# Patient Record
Sex: Male | Born: 1940
Health system: Southern US, Community
[De-identification: ages and names within clinical notes are randomized; demographics above are authoritative.]

## PROBLEM LIST (undated history)

## (undated) DIAGNOSIS — E119 Type 2 diabetes mellitus without complications: Secondary | ICD-10-CM

## (undated) DIAGNOSIS — R9439 Abnormal result of other cardiovascular function study: Secondary | ICD-10-CM

## (undated) DIAGNOSIS — N189 Chronic kidney disease, unspecified: Secondary | ICD-10-CM

## (undated) DIAGNOSIS — I10 Essential (primary) hypertension: Secondary | ICD-10-CM

## (undated) DIAGNOSIS — I4510 Unspecified right bundle-branch block: Secondary | ICD-10-CM

## (undated) DIAGNOSIS — R0600 Dyspnea, unspecified: Secondary | ICD-10-CM

## (undated) DIAGNOSIS — E669 Obesity, unspecified: Secondary | ICD-10-CM

## (undated) DIAGNOSIS — R42 Dizziness and giddiness: Secondary | ICD-10-CM

## (undated) DIAGNOSIS — Z8719 Personal history of other diseases of the digestive system: Secondary | ICD-10-CM

## (undated) DIAGNOSIS — I6521 Occlusion and stenosis of right carotid artery: Secondary | ICD-10-CM

## (undated) DIAGNOSIS — R0609 Other forms of dyspnea: Secondary | ICD-10-CM

## (undated) DIAGNOSIS — I509 Heart failure, unspecified: Secondary | ICD-10-CM

## (undated) DIAGNOSIS — I219 Acute myocardial infarction, unspecified: Secondary | ICD-10-CM

## (undated) DIAGNOSIS — M1990 Unspecified osteoarthritis, unspecified site: Secondary | ICD-10-CM

## (undated) DIAGNOSIS — G629 Polyneuropathy, unspecified: Secondary | ICD-10-CM

## (undated) DIAGNOSIS — K219 Gastro-esophageal reflux disease without esophagitis: Secondary | ICD-10-CM

## (undated) DIAGNOSIS — E785 Hyperlipidemia, unspecified: Secondary | ICD-10-CM

## (undated) DIAGNOSIS — I2510 Atherosclerotic heart disease of native coronary artery without angina pectoris: Secondary | ICD-10-CM

## (undated) DIAGNOSIS — N529 Male erectile dysfunction, unspecified: Secondary | ICD-10-CM

## (undated) DIAGNOSIS — K429 Umbilical hernia without obstruction or gangrene: Secondary | ICD-10-CM

## (undated) HISTORY — DX: Abnormal result of other cardiovascular function study: R94.39

## (undated) HISTORY — DX: Obesity, unspecified: E66.9

## (undated) HISTORY — DX: Unspecified right bundle-branch block: I45.10

## (undated) HISTORY — DX: Occlusion and stenosis of right carotid artery: I65.21

## (undated) HISTORY — DX: Male erectile dysfunction, unspecified: N52.9

## (undated) HISTORY — DX: Dyspnea, unspecified: R06.00

## (undated) HISTORY — DX: Chronic kidney disease, unspecified: N18.9

## (undated) HISTORY — DX: Type 2 diabetes mellitus without complications: E11.9

## (undated) HISTORY — DX: Other forms of dyspnea: R06.09

## (undated) HISTORY — PX: COLONOSCOPY: SHX174

## (undated) HISTORY — DX: Heart failure, unspecified: I50.9

## (undated) HISTORY — DX: Dizziness and giddiness: R42

## (undated) HISTORY — DX: Hyperlipidemia, unspecified: E78.5

## (undated) HISTORY — DX: Atherosclerotic heart disease of native coronary artery without angina pectoris: I25.10

## (undated) HISTORY — DX: Essential (primary) hypertension: I10

---

## 1993-05-25 HISTORY — PX: CORONARY ARTERY BYPASS GRAFT: SHX141

## 1998-05-25 HISTORY — PX: CARDIAC CATHETERIZATION: SHX172

## 2000-11-01 ENCOUNTER — Encounter: Payer: Self-pay | Admitting: Emergency Medicine

## 2000-11-01 ENCOUNTER — Emergency Department (HOSPITAL_COMMUNITY): Admission: EM | Admit: 2000-11-01 | Discharge: 2000-11-01 | Payer: Self-pay | Admitting: Emergency Medicine

## 2002-06-28 ENCOUNTER — Emergency Department (HOSPITAL_COMMUNITY): Admission: EM | Admit: 2002-06-28 | Discharge: 2002-06-28 | Payer: Self-pay | Admitting: Emergency Medicine

## 2002-06-28 ENCOUNTER — Encounter: Payer: Self-pay | Admitting: Emergency Medicine

## 2006-05-15 ENCOUNTER — Emergency Department (HOSPITAL_COMMUNITY): Admission: EM | Admit: 2006-05-15 | Discharge: 2006-05-15 | Payer: Self-pay | Admitting: Emergency Medicine

## 2007-01-06 ENCOUNTER — Emergency Department (HOSPITAL_COMMUNITY): Admission: EM | Admit: 2007-01-06 | Discharge: 2007-01-06 | Payer: Self-pay | Admitting: Emergency Medicine

## 2007-09-15 ENCOUNTER — Emergency Department (HOSPITAL_COMMUNITY): Admission: EM | Admit: 2007-09-15 | Discharge: 2007-09-15 | Payer: Self-pay | Admitting: Family Medicine

## 2007-12-24 HISTORY — PX: UMBILICAL HERNIA REPAIR: SHX196

## 2008-01-17 ENCOUNTER — Encounter (INDEPENDENT_AMBULATORY_CARE_PROVIDER_SITE_OTHER): Payer: Self-pay | Admitting: General Surgery

## 2008-01-17 ENCOUNTER — Ambulatory Visit (HOSPITAL_COMMUNITY): Admission: RE | Admit: 2008-01-17 | Discharge: 2008-01-17 | Payer: Self-pay | Admitting: General Surgery

## 2008-08-18 ENCOUNTER — Ambulatory Visit: Payer: Self-pay | Admitting: Cardiology

## 2008-08-18 ENCOUNTER — Inpatient Hospital Stay (HOSPITAL_COMMUNITY): Admission: EM | Admit: 2008-08-18 | Discharge: 2008-08-23 | Payer: Self-pay | Admitting: Emergency Medicine

## 2008-09-18 ENCOUNTER — Encounter: Admission: RE | Admit: 2008-09-18 | Discharge: 2008-09-18 | Payer: Self-pay | Admitting: Cardiology

## 2008-09-19 ENCOUNTER — Encounter: Admission: RE | Admit: 2008-09-19 | Discharge: 2008-09-19 | Payer: Self-pay | Admitting: Cardiology

## 2008-09-20 ENCOUNTER — Ambulatory Visit: Payer: Self-pay | Admitting: Surgery

## 2008-09-24 ENCOUNTER — Inpatient Hospital Stay (HOSPITAL_COMMUNITY): Admission: RE | Admit: 2008-09-24 | Discharge: 2008-09-25 | Payer: Self-pay | Admitting: Cardiology

## 2009-06-17 ENCOUNTER — Encounter
Admission: RE | Admit: 2009-06-17 | Discharge: 2009-06-17 | Payer: Self-pay | Source: Home / Self Care | Admitting: Cardiology

## 2009-12-23 ENCOUNTER — Ambulatory Visit: Payer: Self-pay | Admitting: Cardiology

## 2010-06-04 ENCOUNTER — Ambulatory Visit (HOSPITAL_COMMUNITY)
Admission: RE | Admit: 2010-06-04 | Discharge: 2010-06-05 | Payer: Self-pay | Source: Home / Self Care | Attending: Cardiovascular Disease | Admitting: Cardiovascular Disease

## 2010-06-06 ENCOUNTER — Ambulatory Visit: Payer: Self-pay | Admitting: Cardiology

## 2010-06-09 LAB — CBC
HCT: 41.2 % (ref 39.0–52.0)
Hemoglobin: 14.2 g/dL (ref 13.0–17.0)
MCH: 31.9 pg (ref 26.0–34.0)
MCHC: 34.5 g/dL (ref 30.0–36.0)
MCV: 92.6 fL (ref 78.0–100.0)
Platelets: 211 10*3/uL (ref 150–400)
RBC: 4.45 MIL/uL (ref 4.22–5.81)
RDW: 12.7 % (ref 11.5–15.5)
WBC: 5.3 10*3/uL (ref 4.0–10.5)

## 2010-06-09 LAB — BASIC METABOLIC PANEL
BUN: 17 mg/dL (ref 6–23)
BUN: 19 mg/dL (ref 6–23)
CO2: 23 mEq/L (ref 19–32)
CO2: 27 mEq/L (ref 19–32)
Calcium: 9.5 mg/dL (ref 8.4–10.5)
Calcium: 9 mg/dL (ref 8.4–10.5)
Chloride: 106 mEq/L (ref 96–112)
Chloride: 104 mEq/L (ref 96–112)
Creatinine, Ser: 1.66 mg/dL — ABNORMAL HIGH (ref 0.4–1.5)
Creatinine, Ser: 1.71 mg/dL — ABNORMAL HIGH (ref 0.4–1.5)
GFR calc Af Amer: 50 mL/min — ABNORMAL LOW (ref >60)
GFR calc Af Amer: 49 mL/min — ABNORMAL LOW (ref >60)
GFR calc non Af Amer: 42 mL/min — ABNORMAL LOW (ref >60)
GFR calc non Af Amer: 40 mL/min — ABNORMAL LOW (ref >60)
Glucose, Bld: 114 mg/dL — ABNORMAL HIGH (ref 70–99)
Glucose, Bld: 102 mg/dL — ABNORMAL HIGH (ref 70–99)
Potassium: 4.2 mEq/L (ref 3.5–5.1)
Potassium: 4.7 mEq/L (ref 3.5–5.1)
Sodium: 138 mEq/L (ref 135–145)
Sodium: 137 mEq/L (ref 135–145)

## 2010-06-11 ENCOUNTER — Ambulatory Visit: Payer: Self-pay | Admitting: Cardiology

## 2010-06-19 NOTE — H&P (Addendum)
NAME:  Chris [name], Chris [name] NO.:  [id number]  MEDICAL RECORD NO.:  [id number]            PATIENT TYPE:  LOCATION:                                 FACILITY:  PHYSICIAN:  Colleen Can. Deborah Chalk, M.D.    DATE OF BIRTH:  DATE OF ADMISSION:  06/04/2010 DATE OF DISCHARGE:                             HISTORY & PHYSICAL   REASON FOR ADMISSION:  For cardiac catheterization and possible angioplasty.  HISTORY OF PRESENT ILLNESS:  Chris [name] has a known history of ischemic heart disease.  Over the last 2 weeks, he has been having an exertional substernal discomfort that radiates to his left neck.  He has had increasing fatigue as well as shortness of breath.  He has a remote history of esophageal stricture and paraesophageal hiatal hernia, but this is a totally different symptom than that.  It is clearly an exertional symptom similar to his known coronary artery disease.  He had acute inferior myocardial infarction with an occluded saphenous vein graft to the right coronary artery on August 18, 2008.  He had a drug-eluting stent placed in the saphenous vein graft.  He was brought back to the Catheterization Lab in May 2010 with a successful stent placement in the native right coronary artery with an overall improvement of flow.  He remained on Plavix for an entire year and basically has done well.  He had his original bypass surgery in 1995. At that time, he had a left internal mammary artery graft to the LAD, saphenous vein graft to the right coronary artery, and saphenous vein graft to the obtuse marginal branch.  He had a postoperative sudden onset of a right inferior quadranopsia that was homonymous in nature. It has resolved.  He has a history of chronic right bundle-branch block, gout, hyperlipidemia, obesity, hypertension, erectile dysfunction, and previous vertigo.  FAMILY HISTORY AND SOCIAL HISTORY:  Positive for coronary artery disease.  His father had previous bypass  surgery.  He is married.  He has 4 children from a previous marriage.  He has not smoked in several years.  CURRENT MEDICATIONS:  Aspirin 1 a day, simvastatin 20 mg a day, fenofibrate, and Protonix 40 mg per day.  ALLERGIES:  None.  PHYSICAL EXAMINATION:  VITAL SIGNS:  His weight is 239.  Blood pressure 138/84 sitting, 130/80 standing.  Heart rate is 53 and regular. HEENT:  Negative. NECK:  Supple without bruits. LUNGS: Clear. HEART:  Regular rate and rhythm. ABDOMEN:  Soft and nontender, but obese. EXTREMITIES:  Without edema.  OVERALL IMPRESSION: 1. Recurrent substernal chest pain with a history of previous coronary     artery bypass grafting in 1995 with previous stenting of both the     vein graft to the right coronary artery and the native right     coronary artery. 2. 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 3. Obesity. 4. 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 5. History of dysphagia.  PLAN:  The patient will be admitted for cardiac catheterization, coronary arteriograms, and possible angioplasty.  Procedure risks and benefits have been explained and the risks including  heart attack, stroke, heart stoppage, death, allergy, and emboli bleeding were explained and the patient is willing to proceed.     Colleen Can. Deborah Chalk, M.D.     SNT/MEDQ  D:  06/03/2010  T:  06/04/2010  Job:  [id number]  Electronically Signed by Roger Shelter M.D. on 06/19/2010 03:37:03 PM

## 2010-07-10 ENCOUNTER — Other Ambulatory Visit (INDEPENDENT_AMBULATORY_CARE_PROVIDER_SITE_OTHER): Payer: PRIVATE HEALTH INSURANCE

## 2010-07-10 DIAGNOSIS — I2510 Atherosclerotic heart disease of native coronary artery without angina pectoris: Secondary | ICD-10-CM

## 2010-08-01 NOTE — Discharge Summary (Signed)
NAME:  Chris [name], Chris [name] NO.:  [id number]  MEDICAL RECORD NO.:  [id number]          PATIENT TYPE:  OIB  LOCATION:  6529                         FACILITY:  [facility]  PHYSICIAN:  Verne Carrow, MDDATE OF BIRTH:  [birth year]  DATE OF ADMISSION:  06/04/2010 DATE OF DISCHARGE:  06/05/2010                              DISCHARGE SUMMARY   PROCEDURES: 1. Cardiac catheterization. 2. Coronary arteriogram. 3. LIMA arteriogram. 4. Saphenous vein angiogram.  PRIMARY FINAL DISCHARGE DIAGNOSIS:  Chest pain, medical therapy for coronary artery disease recommended.  SECONDARY DIAGNOSES: 1. Status post aortocoronary bypass surgery in 1995, with left     internal mammary artery graft to left anterior descending coronary     artery, saphenous vein graft to right coronary artery, saphenous     vein graft to obtuse marginal. 2. Inferior ST elevation myocardial infarction with percutaneous     transluminal coronary angioplasty and drug-eluting stent x2 to the     saphenous vein graft to right coronary artery in March 2010. 3. Mild left ventricular dysfunction with an EF of approximately 50%,     cath in March 2010. 4. Gout. 5. History of vertigo. 6. Hyperlipidemia. 7. Obesity. 8. Hypertension. 9. Family history of coronary artery disease in his father. 10.Status post umbilical hernia repair.  Time at discharge 38 minutes.  HOSPITAL COURSE:  Chris [name] is a 70 year old male with a history of coronary artery disease.  He had increasing anginal symptoms and came to the hospital where he was admitted for further evaluation and catheterization.  Cardiac catheterization was performed on June 04, 2010.  It showed LAD totalled, circumflex totalled, RCA with 70% stenosis, and 80% in- stent restenosis proximal vein graft.  All of these grafts were patent. No LV gram was done secondary to renal insufficiency.  Dr. Clifton James evaluated the films and felt that at this time medical  therapy is the best option.  The proximal RCA could possibly be stented with cutting balloon angioplasty for the mid and distal in-stent restenosis.  On June 05, 2010, Chris [name] was evaluated by Dr. Clifton James.  It was noted there was some progression of disease in the native RCA, but the patient did not wish to start Imdur and medical therapy was recommended at this time.  He had some renal insufficiency with a BUN and creatinine on admission at 17 and 1.66, and post cath, his BUN and creatinine were 19/1.71.  He is to get a BMET next week.  In May 2010, at the time of his last cath, his BUN was 12 with creatinine 1.15.  On June 05, 2010, Chris [name] was evaluated by Dr. Clifton James, considered stable for discharge, to follow up as an outpatient.  DISCHARGE INSTRUCTIONS:  His activity level is to be increased gradually.  He is not to drive for 2 days and no lifting for a week.  He is to call our office for problems with cath site.  He is encouraged to stick to a low-sodium, heart-healthy diet.  He is to follow up with Dr. Deborah Chalk and an appointment will be arranged with the patient contacted regarding this.  DISCHARGE MEDICATIONS: 1. Colchicine 0.6 mg daily. 2. Fenofibrate 160 mg daily. 3. Zocor 20 mg daily. 4. Aspirin 325 mg daily. 5. Omeprazole 40 mg daily. 6. Sublingual nitroglycerin p.r.n.     Theodore Demark, PA-C   ______________________________ Verne Carrow, MD    RB/MEDQ  D:  06/05/2010  T:  06/06/2010  Job:  [id number]  cc:   Colleen Can. Deborah Chalk, M.D.  Electronically Signed by Theodore Demark PA-C on 07/29/2010 06:15:05 AM Electronically Signed by Verne Carrow MD on 07/31/2010 04:49:09 PM

## 2010-09-02 LAB — CBC
HCT: 39.2 % (ref 39.0–52.0)
HCT: 41.4 % (ref 39.0–52.0)
Hemoglobin: 13.7 g/dL (ref 13.0–17.0)
Hemoglobin: 14.4 g/dL (ref 13.0–17.0)
MCHC: 34.8 g/dL (ref 30.0–36.0)
MCHC: 34.9 g/dL (ref 30.0–36.0)
MCV: 94.5 fL (ref 78.0–100.0)
MCV: 94.7 fL (ref 78.0–100.0)
Platelets: 180 10*3/uL (ref 150–400)
Platelets: 202 10*3/uL (ref 150–400)
RBC: 4.15 MIL/uL — ABNORMAL LOW (ref 4.22–5.81)
RBC: 4.37 MIL/uL (ref 4.22–5.81)
RDW: 13.2 % (ref 11.5–15.5)
RDW: 13.5 % (ref 11.5–15.5)
WBC: 4.6 10*3/uL (ref 4.0–10.5)
WBC: 4.9 10*3/uL (ref 4.0–10.5)

## 2010-09-02 LAB — BASIC METABOLIC PANEL
BUN: 12 mg/dL (ref 6–23)
BUN: 12 mg/dL (ref 6–23)
CO2: 23 mEq/L (ref 19–32)
CO2: 27 mEq/L (ref 19–32)
Calcium: 8.9 mg/dL (ref 8.4–10.5)
Calcium: 9 mg/dL (ref 8.4–10.5)
Chloride: 105 mEq/L (ref 96–112)
Chloride: 105 mEq/L (ref 96–112)
Creatinine, Ser: 1.13 mg/dL (ref 0.4–1.5)
Creatinine, Ser: 1.15 mg/dL (ref 0.4–1.5)
GFR calc Af Amer: >60 mL/min (ref >60)
GFR calc Af Amer: >60 mL/min (ref >60)
GFR calc non Af Amer: >60 mL/min (ref >60)
GFR calc non Af Amer: >60 mL/min (ref >60)
Glucose, Bld: 102 mg/dL — ABNORMAL HIGH (ref 70–99)
Glucose, Bld: 118 mg/dL — ABNORMAL HIGH (ref 70–99)
Potassium: 4.1 mEq/L (ref 3.5–5.1)
Potassium: 4.3 mEq/L (ref 3.5–5.1)
Sodium: 139 mEq/L (ref 135–145)
Sodium: 140 mEq/L (ref 135–145)

## 2010-09-02 LAB — PROTIME-INR
INR: 1 (ref 0.00–1.49)
Prothrombin Time: 12.9 seconds (ref 11.6–15.2)

## 2010-09-02 LAB — APTT: aPTT: 29 seconds (ref 24–37)

## 2010-09-04 LAB — DIFFERENTIAL
Basophils Absolute: 0 10*3/uL (ref 0.0–0.1)
Basophils Relative: 1 % (ref 0–1)
Eosinophils Absolute: 0.2 10*3/uL (ref 0.0–0.7)
Eosinophils Relative: 4 % (ref 0–5)
Lymphocytes Relative: 46 % (ref 12–46)
Lymphs Abs: 2.7 10*3/uL (ref 0.7–4.0)
Monocytes Absolute: 0.5 10*3/uL (ref 0.1–1.0)
Monocytes Relative: 9 % (ref 3–12)
Neutro Abs: 2.5 10*3/uL (ref 1.7–7.7)
Neutrophils Relative %: 41 % — ABNORMAL LOW (ref 43–77)

## 2010-09-04 LAB — COMPREHENSIVE METABOLIC PANEL
ALT: 26 U/L (ref 0–53)
ALT: 26 U/L (ref 0–53)
ALT: 27 U/L (ref 0–53)
ALT: 48 U/L (ref 0–53)
AST: 20 U/L (ref 0–37)
AST: 47 U/L — ABNORMAL HIGH (ref 0–37)
AST: 54 U/L — ABNORMAL HIGH (ref 0–37)
AST: 57 U/L — ABNORMAL HIGH (ref 0–37)
Albumin: 2.9 g/dL — ABNORMAL LOW (ref 3.5–5.2)
Albumin: 3 g/dL — ABNORMAL LOW (ref 3.5–5.2)
Albumin: 3.1 g/dL — ABNORMAL LOW (ref 3.5–5.2)
Albumin: 3.4 g/dL — ABNORMAL LOW (ref 3.5–5.2)
Alkaline Phosphatase: 49 U/L (ref 39–117)
Alkaline Phosphatase: 50 U/L (ref 39–117)
Alkaline Phosphatase: 52 U/L (ref 39–117)
Alkaline Phosphatase: 70 U/L (ref 39–117)
BUN: 12 mg/dL (ref 6–23)
BUN: 15 mg/dL (ref 6–23)
BUN: 15 mg/dL (ref 6–23)
BUN: 16 mg/dL (ref 6–23)
CO2: 24 mEq/L (ref 19–32)
CO2: 24 mEq/L (ref 19–32)
CO2: 25 mEq/L (ref 19–32)
CO2: 25 mEq/L (ref 19–32)
Calcium: 8.3 mg/dL — ABNORMAL LOW (ref 8.4–10.5)
Calcium: 8.4 mg/dL (ref 8.4–10.5)
Calcium: 8.7 mg/dL (ref 8.4–10.5)
Calcium: 8.8 mg/dL (ref 8.4–10.5)
Chloride: 102 mEq/L (ref 96–112)
Chloride: 104 mEq/L (ref 96–112)
Chloride: 110 mEq/L (ref 96–112)
Chloride: 99 mEq/L (ref 96–112)
Creatinine, Ser: 1.16 mg/dL (ref 0.4–1.5)
Creatinine, Ser: 1.2 mg/dL (ref 0.4–1.5)
Creatinine, Ser: 1.27 mg/dL (ref 0.4–1.5)
Creatinine, Ser: 1.33 mg/dL (ref 0.4–1.5)
GFR calc Af Amer: >60 mL/min (ref >60)
GFR calc Af Amer: >60 mL/min (ref >60)
GFR calc Af Amer: >60 mL/min (ref >60)
GFR calc Af Amer: >60 mL/min (ref >60)
GFR calc non Af Amer: 54 mL/min — ABNORMAL LOW (ref >60)
GFR calc non Af Amer: 57 mL/min — ABNORMAL LOW (ref >60)
GFR calc non Af Amer: >60 mL/min (ref >60)
GFR calc non Af Amer: >60 mL/min (ref >60)
Glucose, Bld: 108 mg/dL — ABNORMAL HIGH (ref 70–99)
Glucose, Bld: 113 mg/dL — ABNORMAL HIGH (ref 70–99)
Glucose, Bld: 123 mg/dL — ABNORMAL HIGH (ref 70–99)
Glucose, Bld: 135 mg/dL — ABNORMAL HIGH (ref 70–99)
Potassium: 3.8 mEq/L (ref 3.5–5.1)
Potassium: 3.8 mEq/L (ref 3.5–5.1)
Potassium: 4 mEq/L (ref 3.5–5.1)
Potassium: 5.3 mEq/L — ABNORMAL HIGH (ref 3.5–5.1)
Sodium: 131 mEq/L — ABNORMAL LOW (ref 135–145)
Sodium: 134 mEq/L — ABNORMAL LOW (ref 135–145)
Sodium: 135 mEq/L (ref 135–145)
Sodium: 141 mEq/L (ref 135–145)
Total Bilirubin: 0.5 mg/dL (ref 0.3–1.2)
Total Bilirubin: 0.6 mg/dL (ref 0.3–1.2)
Total Bilirubin: 0.7 mg/dL (ref 0.3–1.2)
Total Bilirubin: 1 mg/dL (ref 0.3–1.2)
Total Protein: 5.8 g/dL — ABNORMAL LOW (ref 6.0–8.3)
Total Protein: 5.9 g/dL — ABNORMAL LOW (ref 6.0–8.3)
Total Protein: 6.7 g/dL (ref 6.0–8.3)
Total Protein: 7.3 g/dL (ref 6.0–8.3)

## 2010-09-04 LAB — CBC
HCT: 37.6 % — ABNORMAL LOW (ref 39.0–52.0)
HCT: 39.3 % (ref 39.0–52.0)
HCT: 41.9 % (ref 39.0–52.0)
HCT: 43.5 % (ref 39.0–52.0)
Hemoglobin: 13.1 g/dL (ref 13.0–17.0)
Hemoglobin: 13.6 g/dL (ref 13.0–17.0)
Hemoglobin: 14.6 g/dL (ref 13.0–17.0)
Hemoglobin: 15 g/dL (ref 13.0–17.0)
MCHC: 34.4 g/dL (ref 30.0–36.0)
MCHC: 34.6 g/dL (ref 30.0–36.0)
MCHC: 34.8 g/dL (ref 30.0–36.0)
MCHC: 34.9 g/dL (ref 30.0–36.0)
MCV: 95.2 fL (ref 78.0–100.0)
MCV: 95.6 fL (ref 78.0–100.0)
MCV: 95.8 fL (ref 78.0–100.0)
MCV: 96.1 fL (ref 78.0–100.0)
Platelets: 178 10*3/uL (ref 150–400)
Platelets: 186 10*3/uL (ref 150–400)
Platelets: 194 10*3/uL (ref 150–400)
Platelets: 194 10*3/uL (ref 150–400)
RBC: 3.91 MIL/uL — ABNORMAL LOW (ref 4.22–5.81)
RBC: 4.12 MIL/uL — ABNORMAL LOW (ref 4.22–5.81)
RBC: 4.37 MIL/uL (ref 4.22–5.81)
RBC: 4.55 MIL/uL (ref 4.22–5.81)
RDW: 13.4 % (ref 11.5–15.5)
RDW: 13.5 % (ref 11.5–15.5)
RDW: 13.6 % (ref 11.5–15.5)
RDW: 13.7 % (ref 11.5–15.5)
WBC: 4.7 10*3/uL (ref 4.0–10.5)
WBC: 6 10*3/uL (ref 4.0–10.5)
WBC: 6.5 10*3/uL (ref 4.0–10.5)
WBC: 8.6 10*3/uL (ref 4.0–10.5)

## 2010-09-04 LAB — CARDIAC PANEL(CRET KIN+CKTOT+MB+TROPI)
CK, MB: 28.9 ng/mL — ABNORMAL HIGH (ref 0.3–4.0)
CK, MB: 52.4 ng/mL — ABNORMAL HIGH (ref 0.3–4.0)
CK, MB: 75.5 ng/mL — ABNORMAL HIGH (ref 0.3–4.0)
CK, MB: 88.2 ng/mL — ABNORMAL HIGH (ref 0.3–4.0)
Relative Index: 12.7 — ABNORMAL HIGH (ref 0.0–2.5)
Relative Index: 13.4 — ABNORMAL HIGH (ref 0.0–2.5)
Relative Index: 6.8 — ABNORMAL HIGH (ref 0.0–2.5)
Relative Index: 9.1 — ABNORMAL HIGH (ref 0.0–2.5)
Total CK: 422 U/L — ABNORMAL HIGH (ref 7–232)
Total CK: 564 U/L — ABNORMAL HIGH (ref 7–232)
Total CK: 574 U/L — ABNORMAL HIGH (ref 7–232)
Total CK: 696 U/L — ABNORMAL HIGH (ref 7–232)
Troponin I: 10.77 ng/mL (ref 0.00–0.06)
Troponin I: 10.81 ng/mL (ref 0.00–0.06)
Troponin I: 7.21 ng/mL (ref 0.00–0.06)
Troponin I: 8.93 ng/mL (ref 0.00–0.06)

## 2010-09-04 LAB — CK TOTAL AND CKMB (NOT AT ARMC)
CK, MB: 11.7 ng/mL — ABNORMAL HIGH (ref 0.3–4.0)
Relative Index: 5.3 — ABNORMAL HIGH (ref 0.0–2.5)
Total CK: 222 U/L (ref 7–232)

## 2010-09-04 LAB — LIPID PANEL
Cholesterol: 204 mg/dL — ABNORMAL HIGH (ref 0–200)
HDL: 27 mg/dL — ABNORMAL LOW (ref >39)
LDL Cholesterol: 111 mg/dL — ABNORMAL HIGH (ref 0–99)
Total CHOL/HDL Ratio: 7.6 RATIO
Triglycerides: 330 mg/dL — ABNORMAL HIGH (ref <150)
VLDL: 66 mg/dL — ABNORMAL HIGH (ref 0–40)

## 2010-09-04 LAB — POCT I-STAT, CHEM 8
BUN: 17 mg/dL (ref 6–23)
Calcium, Ion: 1.2 mmol/L (ref 1.12–1.32)
Chloride: 104 mEq/L (ref 96–112)
Creatinine, Ser: 1.1 mg/dL (ref 0.4–1.5)
Glucose, Bld: 132 mg/dL — ABNORMAL HIGH (ref 70–99)
HCT: 40 % (ref 39.0–52.0)
Hemoglobin: 13.6 g/dL (ref 13.0–17.0)
Potassium: 3.4 mEq/L — ABNORMAL LOW (ref 3.5–5.1)
Sodium: 138 mEq/L (ref 135–145)
TCO2: 24 mmol/L (ref 0–100)

## 2010-09-04 LAB — POCT I-STAT 3, ART BLOOD GAS (G3+)
Acid-base deficit: 2 mmol/L (ref 0.0–2.0)
Bicarbonate: 23.1 mEq/L (ref 20.0–24.0)
O2 Saturation: 93 %
TCO2: 24 mmol/L (ref 0–100)
pCO2 arterial: 40.2 mmHg (ref 35.0–45.0)
pH, Arterial: 7.366 (ref 7.350–7.450)
pO2, Arterial: 69 mmHg — ABNORMAL LOW (ref 80.0–100.0)

## 2010-09-04 LAB — POCT CARDIAC MARKERS
CKMB, poc: 8 ng/mL (ref 1.0–8.0)
Myoglobin, poc: 134 ng/mL (ref 12–200)
Troponin i, poc: 0.47 ng/mL (ref 0.00–0.09)

## 2010-09-04 LAB — URIC ACID: Uric Acid, Serum: 9.6 mg/dL — ABNORMAL HIGH (ref 4.0–7.8)

## 2010-09-04 LAB — PROTIME-INR
INR: 0.9 (ref 0.00–1.49)
Prothrombin Time: 12.4 seconds (ref 11.6–15.2)

## 2010-09-04 LAB — APTT: aPTT: 27 seconds (ref 24–37)

## 2010-09-04 LAB — MAGNESIUM: Magnesium: 2.6 mg/dL — ABNORMAL HIGH (ref 1.5–2.5)

## 2010-09-04 LAB — TSH: TSH: 5.297 u[IU]/mL — ABNORMAL HIGH (ref 0.350–4.500)

## 2010-09-04 LAB — TROPONIN I: Troponin I: 1.27 ng/mL (ref 0.00–0.06)

## 2010-10-07 NOTE — Op Note (Signed)
NAME:  Chris [name], Chris [name] NO.:  [id number]   MEDICAL RECORD NO.:  [id number]          PATIENT TYPE:  AMB   LOCATION:  DAY                          FACILITY:  [facility]   PHYSICIAN:  Anselm Pancoast. Weatherly, M.D.DATE OF BIRTH:  10/26/1940   DATE OF PROCEDURE:  DATE OF DISCHARGE:                               OPERATIVE REPORT   PREOPERATIVE DIAGNOSES:  1. Chronically incarcerated umbilical hernia.  2. Exogenous obesity.   OPERATION:  Repair of umbilical hernia with partial omentectomy with  general anesthesia.   CHIEF COMPLAINT:  Umbilical hernia.   HISTORY:  Chris [name] was self-referred to our office for a symptomatic  umbilical hernia.  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  His cardiologist is Dr. Deborah Chalk.  He has had a three-way  cardiac bypass in 1995, but he has no problems with angina since then.  He is on no medications except for a cholesterol medication.  He has  noticed a little swelling at the naval and this defect at the umbilicus  has gradually increased.  It is now about the size of a lemon.  He was  seen in the ER at [facility] in April and they referred him to general  surgery, and I saw him on Oct 19, 2007.  He had sort of a ping-pong  sized bulge protruding more to the right.  On abdominal ultrasound, you  could see in the office that this was fatty tissue from a fairly small  umbilical defect and he is here for the surgical repair.  He has had no  change in activity.  He wanted to wait until a little closer to 67 when  he did his hernia repair and we elected to do it openly.   MEDICATIONS:  As far as on chronic medication, he is on Simvastatin 20  mg once a day and baby aspirin is his only medication.   ALLERGIES:  DENIES.   PAST MEDICAL HISTORY:  Significant for his cardiac problems and surgery  in 1995.   PHYSICAL EXAMINATION:  VITAL SIGNS:  5 feet 11 inches,  weighs about 235  pounds, blood pressure is normal 119/77, pulse 59, respirations 16.  EYES/EARS/NOSE/THROAT:  Well hydrated.  No problems.  LUNGS:  Clear.  There is a well-healed mediastinotomy incision.  CARDIAC:  Normal sinus rhythm.  ABDOMEN:  Obese abdomen with a now sort of a plum or possibly a small  lemon size defect.  It is more to the right of the umbilicus.  I do not  appreciate any groin hernias.  RECTAL:  Examination in the office was negative with stool Hemoccult  negative.  EXTREMITIES:  No pedal edema.  CNS:  Physiologic admission.   IMPRESSION:  1. Incarcerated umbilical hernia.  2. Exogenous obesity.  3. History of previous coronary artery bypass surgery.   PLAN:  Surgical repair of the incarcerated umbilical hernia with mesh  enforcement.           ______________________________  Anselm Pancoast. Zachery Dakins, M.D.  WJW/MEDQ  D:  01/17/2008  T:  01/17/2008  Job:  [id number]

## 2010-10-07 NOTE — Cardiovascular Report (Signed)
NAME:  CASHUS, HALTERMAN NO.:  [id number]   MEDICAL RECORD NO.:  [id number]          PATIENT TYPE:  INP   LOCATION:  2504                         FACILITY:  [facility]   PHYSICIAN:  Colleen Can. Deborah Chalk, M.D.DATE OF BIRTH:  02/12/1941   DATE OF PROCEDURE:  09/24/2008  DATE OF DISCHARGE:                            CARDIAC CATHETERIZATION   HISTORY:  Mr. Wacha presented with an acute inferior myocardial infarction  with an occluded saphenous vein graft to the right coronary artery on  August 18, 2008.  A drug-eluting stent was placed in the saphenous vein  graft.  Because of persistent patency of the native right coronary  artery and after discussion with Mikle Bosworth, we elected to attempt to  recanalized the native right coronary artery considering the fact that  the saphenous vein graft already had disease, was relatively old from  remote bypass surgery, and had a greater than average chance of  progression of disease and reocclusion.  Because of that, he is referred  for angioplasty of the native right coronary artery.   PROCEDURE:  Angioplasty and stent placement in the native right coronary  artery with angiography of the saphenous vein graft to the right  coronary artery.   TYPE AND SITE OF ENTRY:  Percutaneous right femoral artery with  AngioSeal.   CATHETERS:  JR-4 guide with side holes, Prowater guidewire, initially a  2.0 x 15 mm apex and subsequently a 2.5 x 23 mm Xience stent distally  and 2.5 x 28 mm Xience stent proximally.   MEDICATIONS GIVEN PRIOR TO PROCEDURE:  Valium 2 mg p.o.   MEDICATIONS GIVEN DURING THE PROCEDURE:  Angiomax, fentanyl, and Versed.  (He had been on Plavix long-term before the procedure.).   ANGIOGRAPHIC DATA:  The initial angiograms of the saphenous vein graft  demonstrated persistent patency.  There is some mild irregularity at the  distal portion of the stent and narrowing but overall had excellent  satisfactory flow.  There is one small  divot that appeared to be  inconsequential in the proximal portion of the stent.   ANGIOPLASTY PROCEDURE:  The native right coronary artery had severe and  diffuse 99% stenosis between the acute marginal vessel and where the  saphenous vein graft inserted.  We initially used a JR-4 guide but felt  that a JR-4 with side holes would be a better guide.  We then passed a  Prowater guidewire recently easily into the distal right coronary  artery.  We predilated with a 2.0 x 15 mm apex balloon at multiple  different locations.  We then returned and placed a 2.5 x 23 mm Xience  stent distally just before the insertion of the saphenous vein graft.  This was felt to be an excellent location just proximal to the insertion  point.  We then measured the length of the distance back to what would  be a relatively normal proximal vessel.  It was felt to be 28 mm.  We  placed a 28 mm x 2.5 Xience stent proximally and we were able to have a  marginal amount of overlap  of the stents and then still covered the  severely diseased portion of the vessel proximally.  Both stents were  inflated to maximum of 12 atmospheres with what appeared to be a full  dilatation.   After the stents were placed, we had somewhat diffuse disease proximally  but I elected not to try to place a full metal jacket and left this  area of the vessel undilated and unstented.  The flow into the large  acute marginal vessel remained intact.   OVERALL IMPRESSION:  1. Persistent patency of the saphenous vein graft that was stented      during the acute myocardial infarction on August 18, 2008.  2. Successful stent placement in the native right coronary artery with      overall improvement of the distal flow.      Colleen Can. Deborah Chalk, M.D.  Electronically Signed     SNT/MEDQ  D:  09/24/2008  T:  09/25/2008  Job:  [id number]

## 2010-10-07 NOTE — Discharge Summary (Signed)
NAME:  Chris, [name] NO.:  [id number]   MEDICAL RECORD NO.:  [id number]          PATIENT TYPE:  INP   LOCATION:  2504                         FACILITY:  [facility]   PHYSICIAN:  Colleen Can. Deborah Chalk, M.D.DATE OF BIRTH:  [birth year]   DATE OF ADMISSION:  09/24/2008  DATE OF DISCHARGE:  09/25/2008                               DISCHARGE SUMMARY   DISCHARGE DIAGNOSES:  1. Is recent inferior ST-elevation myocardial infarction with      percutaneous coronary intervention and stenting of the saphenous      vein graft to the right coronary with adjunctive aspiration      thrombectomy.  He now has had percutaneous coronary intervention to      the native right coronary with a 2.5- x 23-mm and a 2.5- x 28-mm      XIENCE stent placement into the right coronary.  2. Remote myocardial infarction in 1995 with subsequent coronary      artery bypass grafting x3.  3. Chronic right bundle-branch block.  4. Gout.  5. Vertigo, now resolved.  6. Hyperlipidemia.  7. Obesity.  8. Hypertension.   HISTORY OF PRESENT ILLNESS:  Chris [name] is a very pleasant 70 year old male  who had recent ST-elevation MI back in April 2010.  He underwent  emergent PCI and stenting of the saphenous vein graft to the right  coronary.  He has done well.  He now presents for further attempts at  revascularization to the native right coronary artery which is felt to  improve his overall condition.  Clinically, he has done well with  minimal complaints of chest pain.  He most recently has had significant  episodes of vertigo that have now resolved.   Please see the history and physical for further patient presentation and  profile.   LABORATORY DATA:  On admission, his CBC was normal.  His chemistries  were normal except for a glucose of 102.  PT and PTT were unremarkable.   HOSPITAL COURSE:  The patient was admitted electively on Sep 24, 2008, to  undergo PCI to the native right coronary.  The saphenous vein  graft to  the right coronary showed a patent long-stented segment.  The native  right coronary subsequently had 2 stents placed.  These were a 2.5- x 23-  mm and a 2.5- x 28-mm XIENCE stents placed and overall excellent result  was obtained.  Postprocedure, he was transferred to 2500, and today on  Sep 25, 2008, he is doing well without complaints.  His vertigo has  resolved.  He has had no complaints of chest pain.  Groin is  unremarkable, and he is felt to be a satisfactory candidate for  discharge.   DISCHARGE CONDITION:  Stable.   DISCHARGE DIET:  Low salt, heart healthy.   WOUND CARE:  He is to use an ice pack if needed to the groin.   Discharge medicines are all as he was taking before which include:  1. Plavix 75 mg a day.  2. Aspirin 325 a day.  3. Toprol-XL 25 mg a day.  4. Zocor 40  mg a day.  5. Meclizine if he would need that for dizziness.   We plan on seeing him back in the office early next week, certainly  sooner if any problems arise in the interim.   Greater than 30 minutes spent for discharge.      Sharlee Blew, N.P.      Colleen Can. Deborah Chalk, M.D.  Electronically Signed    LC/MEDQ  D:  09/25/2008  T:  09/25/2008  Job:  [id number]

## 2010-10-07 NOTE — H&P (Signed)
NAME:  Chris [name], Chris [name] NO.:  [id number]   MEDICAL RECORD NO.:  [id number]          PATIENT TYPE:  INP   LOCATION:  2909                         FACILITY:  [facility]   PHYSICIAN:  Rollene Rotunda, MD, FACCDATE OF BIRTH:  May 04, 1941   DATE OF ADMISSION:  08/18/2008  DATE OF DISCHARGE:                              HISTORY & PHYSICAL   PRIMARY CARE PHYSICIAN:  None.   CARDIOLOGY:  Colleen Can. Deborah Chalk, MD   REASON FOR PRESENTATION:  Evaluate the patient with chest pain and acute  inferior infarct.   HISTORY OF PRESENT ILLNESS:  The patient is a 70 year old gentleman who  had bypass in 1995, 3 vessels.  He have no details.  He has not required  catheterization since that time.  He has had chest pain on and off for  the last couple of days; however, at 8:30 this evening while at rest, he  developed substernal chest discomfort.  It has been severe.  It goes up  to the left side of his neck.  He does not recall this before or in his  last few days.  He took an aspirin without relief.  He took sublingual  nitroglycerin without relief.  He presented to the emergency room around  9:30, and was found to have 1.5 mm of ST-segment elevation V2, V3, and  aVF along with inferior Q-waves.  He has right bundle-branch block.   LABORATORIES:  Pending.   CHEST X-RAY:  Pending.   PAST MEDICAL HISTORY:  1. Hyperlipidemia.  2. Coronary artery disease as described.   PAST SURGICAL HISTORY:  1. CABG, 3-vessel in 1995.  2. Umbilical hernia repair.   ALLERGIES:  None.   MEDICATIONS:  Simvastatin, aspirin.   SOCIAL HISTORY:  The patient is married, has been 15 years since current  wife.  He has 4 children from previous marriage.  He quit smoking many  years ago.   FAMILY HISTORY:  Noncontributory for early coronary artery disease.  His  father had heart disease with bypass at age 56.   REVIEW OF SYSTEMS:  As stated in the HPI, and otherwise negative for all  other systems.   PHYSICAL EXAMINATION:  GENERAL:  The patient is in obvious distress.  VITAL SIGNS:  Blood pressure 119/75, heart rate 70s and regular,  afebrile, respiratory rate 20.  HEENT:  Eyes are unremarkable, pupils equal and reactive to light.  Fundi not visualized, oral mucosa remarkable.  NECK:  No jugular distention at 45 degrees.  Carotid upstroke, brisk and  symmetrical.  No bruits, no thyromegaly.  LYMPHATICS:  No cervical, axillary, or inguinal adenopathy.  LUNGS:  Clear to auscultation bilaterally.  BACK:  No costovertebral angle tenderness.  CHEST:  Unremarkable.  Chest wall has sternotomy scar.  HEART:  PMI not displaced or sustained, S1 and S2 within normal limits,  no S3, no S4, no clicks, no rubs, no murmurs.  ABDOMEN:  Obese, umbilical hernia scar well-healed, normal bowel sounds  in frequency and pitch.  No rebound, no guarding, no hepatomegaly, no  splenomegaly.  SKIN:  No rashes, no nodules.  EXTREMITIES:  Pulse 2+ throughout, no edema, no cyanosis, no clubbing.  NEUROLOGIC:  Oriented to person, place, and time, cranial nerves II  through XII grossly intact, motor grossly intact.   EKG as above.   ASSESSMENT AND PLAN:  1. Acute inferior infarct.  The patient presented with 1 hour chest      pain.  He is given heparin, aspirin, and Plavix.  We will start him      on IV nitroglycerin.  I used morphine as his pain as his pain      requires.  He is going to be taken urgently to cardiac cath lab.  2. Risk reduction.  We will check a lipid profile and treat this      aggressively.      Rollene Rotunda, MD, [facility]  Electronically Signed     JH/MEDQ  D:  08/18/2008  T:  08/19/2008  Job:  [id number]   cc:   Colleen Can. Deborah Chalk, M.D.

## 2010-10-07 NOTE — Cardiovascular Report (Signed)
NAME:  HAU, SANOR NO.:  [id number]   MEDICAL RECORD NO.:  [id number]          PATIENT TYPE:  INP   LOCATION:  2909                         FACILITY:  [facility]   PHYSICIAN:  Arturo Morton. Riley Kill, MD, FACCDATE OF BIRTH:  [birth year]   DATE OF PROCEDURE:  DATE OF DISCHARGE:                            CARDIAC CATHETERIZATION   INDICATIONS:  Mr. Routon is a 70 year old gentleman who previously  underwent revascularization surgery by Dr. Sheliah Plane.  This was  done approximately 15 years earlier.  He has remained relatively stable.  He presented with severe chest pain tonight.  EKG suggested an inferior  infarction.  He was seen by Dr. Antoine Poche in the emergency room, and a  Code STEMI activated.  He received oral chewable aspirin, and 600 mg of  oral clopidogrel.  Risks and benefits were discussed with the patient,  and urgent intervention recommended.  He was agreeable to proceed.   PROCEDURE:  1. Left heart catheterization.  2. Selective coronary arteriography.  3. Selective left ventriculography.  4. Saphenous vein graft angiography.  5. Selective left internal mammary angiography.  6. Percutaneous stenting of the saphenous vein graft to the right      coronary artery with adjunctive aspiration thrombectomy and distal      protection using an ev3 Spider catheter.   DESCRIPTION OF THE PROCEDURE:  The patient was brought to the  Catheterization Laboratory and prepped and draped in usual fashion.  Oral clopidogrel, and aspirin were given according to protocol.  Intravenous heparin was administered in the emergency room.  He was  brought promptly to the Catheterization Laboratory.  In the  Catheterization Laboratory, he was quickly prepped by the team.  An  iSTAT had been performed with an elevated potassium, but on recheck it  was 3.4.  Following this, the right femoral artery was easily entered  using an anterior puncture and a 6-French sheath was placed.   Bivalve  routing was then administered according to protocol.  Views of the left  coronary artery were obtained and the right coronary artery using a JR4  guiding catheter with side holes.  A quick subclavian shot was  performed, which demonstrated patency of the internal mammary down to  the distal LAD.  Vein grafts were injected and demonstrated subtotal  thrombotic occlusion of the saphenous vein graft to the distal right  coronary.  ACT was checked and found to be appropriate for percutaneous  coronary intervention.  We quickly wired the lesion and then a very  small balloon was used to predilate to just get adequate flow down the  vessel with the hopes of not resulting in distal embolization.  Several  dilatations were done with some improvement in flow.  Following this, a  4-mm Spider catheter was placed distally for distal protection.  This  was followed by multiple passes with a Fetch catheter for aspiration  thrombectomy and a large amount of friable, clot material was removed  from the vein graft.  It was markedly improved in appearance by the time  we completed aspiration thrombectomy.  Following this, we  chose to use a  long stent, specifically a 38-mm TAXUS Liberte drug-eluting platform.  This was deployed at approximately 15-16 atmospheres.  As it was  slightly under dilated, and given the fact that we had distal  protection, I elected to post dilate the vein graft using a 3.5 Peters  Voyager balloon.  This was done throughout with the distal protection.  Following this, the ev3 catheter was retrieved.  Intracoronary verapamil  was administered in order to improve microcirculatory flow.  Following  this, the guiding catheter was removed and replaced by a left internal  mammary catheter.  Views of the internal mammary were then obtained  followed by central aortic and left ventricular pressures.  Ventriculography was performed in the RAO projection.  All catheters  were then  subsequently removed, the femoral sheath sewn into place, and  the patient was taken to the Coronary Care Unit in satisfactory clinical  condition.   HEMODYNAMIC DATA:  1. The central aortic pressure was 135/90 with a mean of 110.  2. Left ventricular pressure of 132/23.  3. There was no gradient on pullback across the aortic valve.   ANGIOGRAPHIC DATA.:  1. The left main was free of critical disease.  2. The LAD was totally occluded after takeoff of the small diagonal.      Proximal to the small diagonal was a 90% stenosis.  3. The circumflex has a small ramus intermedius that is free of      disease and the AV circumflex is occluded.  4. The internal mammary to the distal LAD is intact.  There is good      flow antegrade and distally.  There is about 50% area of narrowing      in the small caliber LAD, retrograde this vessel fills into a      diagonal and the diagonal has disease as well.  5. The saphenous vein graft to the obtuse marginal appears to be      widely patent.  6. The right coronary artery demonstrates about 60% narrowing prior to      the acute marginal or RV branch.  This was followed by subtotal      occlusion of approximately a 20-mm to 30-mm area.  There is flow,      however, into the distal vessel.  There is some 60% narrowing in      the distal portion of the artery prior to the graft insertion.  7. The saphenous vein graft to the right is subtotally occluded.      There is a large clot burden.  There are at least 2 major and      probably 3 lesions.  Following stenting with a 38-mm stent, this      was reduced to 0% residual luminal narrowing with an excellent      angiographic result.  There is, perhaps, a small amount of distal      embolization into the PDA distally.  However, this was small in      comparison to the amount of clot present, and as noted, distal      protection was used in combination with aspiration thrombectomy for      removal of large  amounts of thrombus.  Runoff into the distal      vessel was TIMI 3.  8. The ventriculogram demonstrates hypokinesis of the inferobasal      segment with ejection fraction of approximately 50%.   Time points, emergency room arrival was  at 9:30 p.m.  Cath lab arrival  was at 10:24 p.m.  First device was at 10:44 p.m.  Total balloon time  was 74 minutes.  Cath lab to first device was 20 minutes.   CONCLUSIONS:  1. Acute inferior infarction due to a subtotal occlusion of the      saphenous vein graft to the distal right coronary artery with      successful percutaneous intervention with adjunctive thrombectomy      and distal protection.  2. Continued patency of the internal mammary to the LAD.  3. Continued patency of the saphenous vein graft to the OM.  4. Mild reduction in left ventricular function without critical      stenosis.   DISPOSITION:  1. The patient will be treated with aspirin and Plavix, minimum would      be 1 year, and given the vein graft deterioration, some      consideration of continued Plavix would be strongly recommended.  2. Continued risk factor reduction.  3. I will review the films with Dr. Deborah Chalk, as down the road, there      may be some benefit to consider percutaneous stenting of the native      RCA for a longer result, although there is fairly diffuse proximal      plaquing.       Arturo Morton. Riley Kill, MD, [facility]  Electronically Signed     TDS/MEDQ  D:  08/19/2008  T:  08/19/2008  Job:  [id number]   cc:   Colleen Can. Deborah Chalk, M.D.  Rollene Rotunda, MD, [facility]  CV Laboratory

## 2010-10-07 NOTE — Op Note (Signed)
NAME:  Chris [name], Chris [name] NO.:  [id number]   MEDICAL RECORD NO.:  [id number]          PATIENT TYPE:  AMB   LOCATION:  DAY                          FACILITY:  [facility]   PHYSICIAN:  Anselm Pancoast. Weatherly, M.D.DATE OF BIRTH:  [birth year]   DATE OF PROCEDURE:  01/17/2008  DATE OF DISCHARGE:  01/17/2008                               OPERATIVE REPORT   PREOPERATIVE DIAGNOSIS:  Incarcerated umbilical hernia.   POSTOPERATIVE DIAGNOSES:  Incarcerated umbilical hernia with omentum.   OPERATION:  Repair of umbilical hernia with partial omentectomy and mesh  in the preperitoneal space.   General anesthesia.   SURGEON:  Anselm Pancoast. Zachery Dakins, M.D.   ASSISTANT:  Nurse.   HISTORY:  Rudolf Blizard is a 70 year old male, about 235 pounds who has  had a small defect at the umbilicus.  This has gradually increased in  size over the last probably year.  He was seen in the emergency at [facility]  in May or late April and referred to our office and he has elected to  the now repair the hernia.  He had hoped to wait until he was 45, but I  think he has proceeded sooner since the hernia appears to be getting a  little larger.  The patient is followed by Colleen Can. Deborah Chalk, M.D.,  his cardiologist.  He is on no chronic medications except for a baby  aspirin and cholesterol medication.   Preoperatively he was given a gram of Ancef, taken to the operative  suite.  Induction of general anesthesia by Jenelle Mages. Fortune, M.D.  and the anesthetist with endotracheal tube and the abdomen was prepped  with Betadine surgical scrub and solution and draped in a sterile  manner.  A made a curved incision above the umbilicus and this wad of  omentum and hernia sac was separated from the surrounding adipose tissue  and after the hernia sac was freed circumferentially I opened it and the  wad of incarcerated omentum was separated.  The little pedicles were  tied with 2-0 Vicryl after clamped with Tresa Endo and  there was nothing but  fatty tissue up in this hernia sac.  It was then reduced back into the  preperitoneal cavity and I actually closed the peritoneum after trimming  the hernia sac off with 2-0 Vicryl and I had used the same for the ties  and also some suture of the little blood vessels right at the umbilicus.  I then separated the fascia from the peritoneum circumferentially so a  piece of Prolene mesh about 3 x 3 inches could be placed in.  This was  anchored with corner sutures with zero Prolene going far and wide and  then I put a stitch in between each of the four corners and then closed  the fascia transversely, kind of incorporating a little bit of the mesh  in the center of the mesh with the Prolene sutures.  I had anesthetized  the fascia with about 20 mL of Marcaine with adrenaline prior to tying  the sutures and then the subcutaneous tissue was closed 3-0 Vicryl  and  then 4-0 nylon simple sutures or interrupted mattress and simple for the  fascia and the skin.  I placed antibiotic ointment, a 4x4 kind  of rolled up into the umbilicus to create a little indentation again.  The patient tolerated the procedure nicely was extubated and sent to  recovery room in stable postop condition.   If he is not too nauseous he wants to be released today and I will give  him Vicodin for pain.           ______________________________  Anselm Pancoast. Zachery Dakins, M.D.     WJW/MEDQ  D:  01/17/2008  T:  01/18/2008  Job:  [id number]

## 2010-10-07 NOTE — H&P (Signed)
NAME:  JAIMESON, Chris [name] NO.:  [id number]   MEDICAL RECORD NO.:  [id number]          PATIENT TYPE:  OIB   LOCATION:                               FACILITY:  [facility]   PHYSICIAN:  Colleen Can. Deborah Chalk, M.D.DATE OF BIRTH:  1940/09/04   DATE OF ADMISSION:  09/11/2008  DATE OF DISCHARGE:                              HISTORY & PHYSICAL   CHIEF COMPLAINT:  None.   HISTORY OF PRESENT ILLNESS:  Mr. Papesh is a 70 year old white male who has  had recent ST-elevation MI inferiorly and has undergone percutaneous  coronary intervention and stenting of the saphenous vein graft to the  right coronary artery on August 18, 2008.  He has done well since that  time.  He now presents for further attempts at revascularization to the  native right coronary artery.  Clinically, he has done well with minimal  complaints of chest pain.   PAST MEDICAL HISTORY:  1. Recent ST-elevation MI inferiorly, status post percutaneous      coronary intervention and stenting of the saphenous vein graft to      the right coronary with a 2.75 x 30 mm Taxus Liberte stent.  He did      have adjunctive aspiration thrombectomy and distal protection using      a spider catheter.  2. 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 Sheliah Plane with left      internal mammary to the LAD, saphenous vein graft to the right      coronary artery, and saphenous vein graft to the obtuse margin in      May 1995.  3. Chronic right bundle-branch block.  4. Gout.  5. Previous right leg pain, improved with steroid therapy.  6. Hyperlipidemia.  7. Obesity.  8. Hypertension.   ALLERGIES:  None.   CURRENT MEDICINES:  1. Plavix 75 mg a day.  2. Aspirin 325 a day.  3. Toprol-XL 25 mg a day.  4. Simvastatin 40 mg a day.  5. Nitroglycerin p.r.n.   FAMILY HISTORY:  His father has had previous bypass surgery as well as  hypertension and hyperlipidemia.   SOCIAL  HISTORY:  He is married.  He has no current alcohol or tobacco  use.   REVIEW OF SYSTEMS:  He has done well since his discharge from the  hospital on August 23, 2008.  During that hospitalization, he had  significant right leg pain, which was felt to possibly be either gout or  sciatica.  This was treated with Medrol Dosepak with complete  resolution.  He has had very minimal chest pain.  He has not used any  nitroglycerin.  He is not short of breath.  His energy level has been  somewhat decreased since his recent heart attack.  He has had no recent  fever, flu, or cough.  He is tolerating all these medicines without  problems. GI and GU system is unremarkable.  All other review of systems  are negative.   PHYSICAL EXAMINATION:  GENERAL:  He is a pleasant white male.  He is in  no acute distress.  VITAL SIGNS:  His weight is 224.  Blood pressure was 100/60 sitting,  98/60 standing.  Heart rate 54 and regular.  Respirations 18.  He is  afebrile.  SKIN:  Warm and dry.  Color is unremarkable.  HEENT:  He is normocephalic and atraumatic.  Pupils are equal and  reactive to light. Conjunctiva normal.  NECK:  Supple.  No JVD.  Oropharynx is clear.  LUNGS:  Clear. He is not dyspneic.  CARDIAC:  Regular rhythm. No murmur.  ABDOMEN:  Obese, yet soft, positive bowel sounds, nontender.  EXTREMITIES:  Without edema.  MUSCULOSKELETAL:  Gait and range of motion are now intact.  NEUROLOGIC:  No gross focal deficits.   PERTINENT LABORATORY DATA:  Pending.   OVERALL IMPRESSION:  1. Recent ST-elevation myocardial infarction inferiorly, treated      emergently with percutaneous coronary intervention to saphenous      vein graft to the right coronary artery.  He has known residual      disease in the native right coronary.  2. Remote bypass surgery.  3. Hyperlipidemia.  4. Hypertension.  5. Obesity.   PLAN:  We will proceed on with attempts at PCI to the native right  coronary artery.  Procedure  has been reviewed in full detail and he is  willing to proceed on Tuesday, September 11, 2008.      Sharlee Blew, N.P.      Colleen Can. Deborah Chalk, M.D.  Electronically Signed    LC/MEDQ  D:  08/29/2008  T:  08/29/2008  Job:  [id number]

## 2010-10-07 NOTE — Discharge Summary (Signed)
NAME:  Chris [name], Chris [name] NO.:  [id number]   MEDICAL RECORD NO.:  [id number]          PATIENT TYPE:  INP   LOCATION:  2009                         FACILITY:  [facility]   PHYSICIAN:  Colleen Can. Deborah Chalk, M.D.DATE OF BIRTH:  06/15/1940   DATE OF ADMISSION:  08/18/2008  DATE OF DISCHARGE:  08/23/2008                               DISCHARGE SUMMARY   DISCHARGE DIAGNOSES:  1. ST elevation myocardial infarction/inferior status post      percutaneous coronary intervention and stenting of the saphenous      vein graft to the right coronary artery with adjunctive aspiration      thrombectomy and distal protection using a spider catheter.  2. Known coronary artery disease with remote coronary artery bypass      grafting in 1995.  3. Right bundle branch block.  4. Questionable history of gout.  5. Right leg pain, currently improving with steroid therapy.  6. Hyperlipidemia.  7. Obesity.   HISTORY OF PRESENT ILLNESS:  Chris [name] is a 70 year old white male who had  remote bypass surgery in 1995.  He has basically done well since that  time, but has really not modified cardiovascular risk factors.  He  presents to the hospital with chest pain off and on for the several days  prior to admission; however, at 8:30 p.m. while he was at rest, he  developed substernal chest discomfort.  It was severe.  It radiated up  to the left side of his neck.  He took aspirin without relief, he took  nitroglycerin without relief, and he presented to the emergency room  around 9:30 p.m. and was found to have 1.5 mm of ST-segment elevation in  leads V2, V3, and aVF with inferior Q-waves.  He did have a right bundle  branch block.  He was subsequently admitted for further evaluation.   Please see the history and physical per Dr. Rollene Rotunda for further  patient presentation and profile.   LABORATORY DATA ON ADMISSION:  CBC showed hemoglobin 13, hematocrit was  37, white count 4.7, platelets 178.   Chemistries showed a sodium of 134,  potassium 3.8, chloride 104, CO2 24, BUN 15, creatinine 1.2, glucose of  108.  His LFTs were basically unremarkable.  His peak troponin was  10.77, his peak CK-MB was 88.2.  His magnesium level was 2.6.  TSH is  5.3.   Chest x-ray on admission showed no acute process.  He did have a  suboptimal inspiration. This was a portable film.   HOSPITAL COURSE:  The patient was admitted emergently and taken to the  cardiac catheterization lab per Dr. Shawnie Pons.  That procedure was  performed without any known complications.  The angiographic data is as  follows:  The left main was free of critical disease, the LAD is totally  occluded after takeoff of a small diagonal, proximal to small diagonal  has a 90% stenosis, the circumflex has a small ramus intermediate that  is free of disease and the AV circumflex is occluded, the internal  mammary to the distal LAD is intact.  There is about a 50% narrowing in  the small-caliber LAD, saphenous vein graft to the OM appears to be  widely patent, the right coronary has a 60% narrowing prior to the acute  margin followed by subtotal occlusion.  There is flow, however, into the  distal vessel.  There is a 60% narrowing in the distal portion of the  artery prior to the graft insertion, saphenous vein graft to the right  coronary was totally occluded with significant large clot burden.  Subsequently, angioplasty and adjunctive aspiration thrombectomy was  performed.  A 2.75 x 38-mm TAXUS Liberte stent was placed and overall  satisfactory result was obtained and he was subsequently transferred to  the Coronary Care Unit for further monitoring.  His ejection fraction  was noted to be 50% at the time of cardiac catheterization.   Postprocedure, he was transferred to the Coronary Care Unit.  He has  basically done well throughout the remainder of his hospitalization.  He  has primarily been limited with significant right  leg pain that has  subsequent been treated with Medrol Dosepak.  His uric acid level was  noted to be a little bit elevated and we may start allopurinol as an  outpatient.  His activity has been difficult to advance because of his  right leg pain, but this is currently improving by the time of discharge  on August 23, 2008.  Overall, his physical exam is unremarkable and he is  felt to be a satisfactory candidate for discharge.   Our plan will be to bring Chris [name] back into the hospital in appoximately  2 weeks for PCI attempts to the native RCA.   DISCHARGE CONDITION:  Stable.   DISCHARGE DIET:  Low-salt, heart-healthy.   WOUND CARE:  He is to use an ice pack if needed to the groin.   We will plan on seeing him back in the office towards the mid part of  next week.   DISCHARGE MEDICINES:  1. Enteric-coated aspirin 325 a day.  2. Plavix 75 mg a day.  3. Toprol-XL 25 mg a day.  4. Simvastatin 40 mg as he was taking before.  5. Medrol Dosepak will be finished.  6. Nitroglycerin p.r.n. if needed.   His activity is to be light with no driving, no sexual activity.  He is  to call if any problems arise in the interim, otherwise we will see him  back towards the middle part of next week, certainly sooner if problems  arise.   Greater than 30 minutes spent for dictation.      Sharlee Blew, N.P.      Colleen Can. Deborah Chalk, M.D.  Electronically Signed    LC/MEDQ  D:  08/23/2008  T:  08/23/2008  Job:  [id number]

## 2010-10-15 ENCOUNTER — Telehealth: Payer: Self-pay | Admitting: Cardiology

## 2010-10-15 NOTE — Telephone Encounter (Signed)
Called wanting to know who his new doctor is going to be. Please call back.

## 2010-10-31 ENCOUNTER — Encounter: Payer: Self-pay | Admitting: Cardiology

## 2010-11-03 ENCOUNTER — Other Ambulatory Visit: Payer: Self-pay | Admitting: *Deleted

## 2010-11-03 DIAGNOSIS — E785 Hyperlipidemia, unspecified: Secondary | ICD-10-CM

## 2010-11-03 MED ORDER — PANTOPRAZOLE SODIUM 40 MG PO TBEC: 40.0000 mg | DELAYED_RELEASE_TABLET | Freq: Every day | ORAL | Status: DC

## 2010-11-03 NOTE — Telephone Encounter (Signed)
90 days called to [facility] w/ no refills.

## 2010-11-04 ENCOUNTER — Encounter: Payer: Self-pay | Admitting: Cardiology

## 2010-11-04 ENCOUNTER — Other Ambulatory Visit (INDEPENDENT_AMBULATORY_CARE_PROVIDER_SITE_OTHER): Payer: Medicare Other | Admitting: *Deleted

## 2010-11-04 ENCOUNTER — Ambulatory Visit (INDEPENDENT_AMBULATORY_CARE_PROVIDER_SITE_OTHER): Payer: Medicare Other | Admitting: Cardiology

## 2010-11-04 DIAGNOSIS — E785 Hyperlipidemia, unspecified: Secondary | ICD-10-CM

## 2010-11-04 DIAGNOSIS — I2510 Atherosclerotic heart disease of native coronary artery without angina pectoris: Secondary | ICD-10-CM | POA: Insufficient documentation

## 2010-11-04 DIAGNOSIS — E669 Obesity, unspecified: Secondary | ICD-10-CM

## 2010-11-04 DIAGNOSIS — N289 Disorder of kidney and ureter, unspecified: Secondary | ICD-10-CM

## 2010-11-04 DIAGNOSIS — I6350 Cerebral infarction due to unspecified occlusion or stenosis of unspecified cerebral artery: Secondary | ICD-10-CM

## 2010-11-04 DIAGNOSIS — I639 Cerebral infarction, unspecified: Secondary | ICD-10-CM

## 2010-11-04 DIAGNOSIS — I10 Essential (primary) hypertension: Secondary | ICD-10-CM

## 2010-11-04 NOTE — Progress Notes (Signed)
Subjective:   Chris [name] is seen in the office today for followup visit. In general, he's been doing well without any specific complaints of chest pain or weakness. He has had some dietary indiscretion he he had cardiac catheterization in January 2012. His grafts were pain and we could manage him medically since that time without any recurrent chest discomfort. Overall, he's been doing well. He has the ability to check his blood pressure readings at home but does note that they have been mildly elevated.  He has known coronary disease with his original bypass surgery in 1995. He had a LIMA to the LAD and saphenous vein graft the right coronary artery and saphenous vein graft to the obtuse marginal. He had an inferior MI with stenting of the saphenous vein graft and then stenting of the native right coronary artery in may of 2010. During this time in 2010, he had stents placed in both the saphenous vein graft and native right coronary artery. When he had repeat catheterization in January of 2012, he did have restenosis in the stents to his native right coronary artery and had a patent saphenous vein graft. His other problems have included chronic right bundle branch block, gout, hyperlipemia, obesity, hypertension, erectile dysfunction, and previous vertigo. He's had a moderate-sized hilar hernia with paraesophageal component  Current Outpatient Prescriptions  Medication Sig Dispense Refill  . aspirin 325 MG tablet Take 325 mg by mouth daily.        . fenofibrate (TRICOR) 145 MG tablet Take 160 mg by mouth daily.       . nitroGLYCERIN (NITROSTAT) 0.4 MG SL tablet Place 0.4 mg under the tongue every 5 (five) minutes as needed.        . pantoprazole (PROTONIX) 40 MG tablet Take 1 tablet (40 mg total) by mouth daily.  90 tablet  0  . simvastatin (ZOCOR) 20 MG tablet Take 20 mg by mouth at bedtime.          No Known Allergies  There is no problem list on file for this patient.   History  Smoking status  .  Former Smoker -- 1.0 packs/day for 9 years  . Types: Cigarettes  . Quit date: 10/31/1970  Smokeless tobacco  . Former Neurosurgeon  . Types: Chew  Comment: chewed for 2 years    History  Alcohol Use No    No family history on file.  Review of Systems:   The patient denies any heat or cold intolerance.  No weight gain or weight loss.  The patient denies headaches or blurry vision.  There is no cough or sputum production.  The patient denies dizziness.  There is no hematuria or hematochezia.  The patient denies any muscle aches or arthritis.  The patient denies any rash.  The patient denies frequent falling or instability.  There is no history of depression or anxiety.  All other systems were reviewed and are negative.   Physical Exam:   He is moderately obese. Blood pressure is 140/86 sitting, heart rate 56. Weights 236.The head is normocephalic and atraumatic.  Pupils are equally round and reactive to light.  Sclerae nonicteric.  Conjunctiva is clear.  Oropharynx is unremarkable.  There's adequate oral airway.  Neck is supple there are no masses.  Thyroid is not enlarged.  There is no lymphadenopathy.  Lungs are clear.  Chest is symmetric.  Heart shows a regular rate and rhythm.  S1 and S2 are normal.  There is no murmur click or gallop.  Abdomen is soft normal bowel sounds.  There is no organomegaly.  Genital and rectal deferred.  Extremities are without edema.  Peripheral pulses are adequate.  Neurologically intact.  Full range of motion.  The patient is not depressed.  Skin is warm and dry.  Assessment / Plan:

## 2010-11-04 NOTE — Assessment & Plan Note (Signed)
Continue medical management 

## 2010-11-04 NOTE — Assessment & Plan Note (Signed)
Will add losartan 50 mg per day to his regimen. I will have him see Lawson Fiscal in 2 weeks for followup including a B. Met. He will bring a list of blood pressure readings.

## 2010-11-04 NOTE — Assessment & Plan Note (Signed)
We'll recheck renal function in 2 weeks after starting losartan

## 2010-11-20 ENCOUNTER — Encounter: Payer: Self-pay | Admitting: Nurse Practitioner

## 2010-11-20 ENCOUNTER — Ambulatory Visit (INDEPENDENT_AMBULATORY_CARE_PROVIDER_SITE_OTHER): Payer: Medicare Other | Admitting: Nurse Practitioner

## 2010-11-20 VITALS — BP 120/70 | HR 60 | Ht 69.0 in | Wt 238.0 lb

## 2010-11-20 DIAGNOSIS — I10 Essential (primary) hypertension: Secondary | ICD-10-CM

## 2010-11-20 DIAGNOSIS — E785 Hyperlipidemia, unspecified: Secondary | ICD-10-CM

## 2010-11-20 DIAGNOSIS — I2510 Atherosclerotic heart disease of native coronary artery without angina pectoris: Secondary | ICD-10-CM

## 2010-11-20 NOTE — Progress Notes (Signed)
    Chris [name] Date of Birth: 08/12/1940   History of Present Illness: Chris [name] is seen back today for his 2 week check. He is seen for Dr. Shirlee Latch. He is a former patient of Dr. Ronnald Nian. When he was here 2 weeks ago, blood pressure was up. Cozaar was started. He cut back his salt intake and his blood pressure has been great since then. He did not start the medicine. He is interested in trying to lose weight and we discussed those measures in detail. He stays active, but does not have a regular exercise program. He tends to drink a lot of sweet tea and snack heavily at night. No chest pain reported.   Current Outpatient Prescriptions on File Prior to Visit  Medication Sig Dispense Refill  . aspirin 325 MG tablet Take 325 mg by mouth daily.        . fenofibrate (TRICOR) 145 MG tablet Take 160 mg by mouth daily.       . nitroGLYCERIN (NITROSTAT) 0.4 MG SL tablet Place 0.4 mg under the tongue every 5 (five) minutes as needed.        . pantoprazole (PROTONIX) 40 MG tablet Take 1 tablet (40 mg total) by mouth daily.  90 tablet  0  . simvastatin (ZOCOR) 20 MG tablet Take 20 mg by mouth at bedtime.          No Known Allergies  Past Medical History  Diagnosis Date  . Chest pain   . Coronary artery disease   . MI, old     INFERIOR  . RBBB (right bundle branch block)   . Gout   . Hyperlipidemia   . Obesity   . Hypertension   . ED (erectile dysfunction)   . Vertigo   . Hx of CABG 1995    Past Surgical History  Procedure Date  . Cardiac catheterization 06/04/2010    Grafts patent, stents patent but has progression of disese in the native RCA  . Coronary angioplasty with stent placement 09/24/2008    stent in SVG to RCA and stenting of native RCA  . Cardiac catheterization 08/19/2008  . Coronary artery bypass graft 1995    LIMA to LAD, SVG to RCA, SVG to OM.     History  Smoking status  . Former Smoker -- 1.0 packs/day for 9 years  . Types: Cigarettes  . Quit date: 10/31/1970    Smokeless tobacco  . Former Neurosurgeon  . Types: Chew  Comment: chewed for 2 years    History  Alcohol Use No    Family History  Problem Relation Age of Onset  . Heart disease Father     Review of Systems: The review of systems is as above.  All other systems were reviewed and are negative.  Physical Exam: BP 120/70  Pulse 60  Ht 5\' 9"  (1.753 m)  Wt 238 lb (107.956 kg)  BMI 35.15 kg/m2 Patient is very pleasant and in no acute distress. He is obese. Skin is warm and dry. Color is normal.  HEENT is unremarkable. Normocephalic/atraumatic. PERRL. Sclera are nonicteric. Neck is supple. No masses. No JVD. Lungs are clear. Cardiac exam shows a regular rate and rhythm. Abdomen is soft and obese. Extremities are without edema. Gait and ROM are intact. No gross neurologic deficits noted.  LABORATORY DATA: N/A   Assessment / Plan:

## 2010-11-20 NOTE — Assessment & Plan Note (Signed)
His blood pressure is fine. He says he can stay off the salt. He is interested in trying to lose weight and seems motivated. I have given him our "5 tips for weight loss". We will see him back in 4 months with fasting labs. I will have him see Dr. Shirlee Latch at that time. He will continue to monitor his blood pressure at home.  Patient is agreeable to this plan and will call if any problems develop in the interim.

## 2010-11-20 NOTE — Assessment & Plan Note (Signed)
He is doing well without chest pain. Risk factor modification is encouraged. He does seem motivated at this time.

## 2010-11-20 NOTE — Assessment & Plan Note (Signed)
Will recheck labs on return visit.

## 2010-11-20 NOTE — Patient Instructions (Addendum)
I encourage you to exercise 45 to 60 minutes each day Just drink water Try to cut back on your snacking at night I will have you see Dr. Marca Ancona in about 4 months with fasting labs Call for any problems Keep check of your blood pressure. You can stay off the Losartan for now (as long as you stay off the salt)

## 2010-12-04 ENCOUNTER — Other Ambulatory Visit: Payer: PRIVATE HEALTH INSURANCE | Admitting: *Deleted

## 2011-03-10 ENCOUNTER — Telehealth: Payer: Self-pay | Admitting: Nurse Practitioner

## 2011-03-10 NOTE — Telephone Encounter (Signed)
Pt wants refill of simvastin sent to [facility] pharmacy

## 2011-03-11 MED ORDER — SIMVASTATIN 20 MG PO TABS: 20.0000 mg | ORAL_TABLET | Freq: Every day | ORAL | Status: DC

## 2011-03-11 NOTE — Telephone Encounter (Signed)
Addended by: Reine Just on: 03/11/2011 03:47 PM   Modules accepted: Orders

## 2011-04-13 ENCOUNTER — Ambulatory Visit (INDEPENDENT_AMBULATORY_CARE_PROVIDER_SITE_OTHER): Payer: Medicare Other | Admitting: Cardiology

## 2011-04-13 DIAGNOSIS — E7800 Pure hypercholesterolemia, unspecified: Secondary | ICD-10-CM

## 2011-04-13 DIAGNOSIS — G629 Polyneuropathy, unspecified: Secondary | ICD-10-CM

## 2011-04-13 DIAGNOSIS — I25810 Atherosclerosis of coronary artery bypass graft(s) without angina pectoris: Secondary | ICD-10-CM

## 2011-04-13 DIAGNOSIS — I2510 Atherosclerotic heart disease of native coronary artery without angina pectoris: Secondary | ICD-10-CM

## 2011-04-13 DIAGNOSIS — E785 Hyperlipidemia, unspecified: Secondary | ICD-10-CM

## 2011-04-13 DIAGNOSIS — N289 Disorder of kidney and ureter, unspecified: Secondary | ICD-10-CM

## 2011-04-13 DIAGNOSIS — I10 Essential (primary) hypertension: Secondary | ICD-10-CM

## 2011-04-13 MED ORDER — LOSARTAN POTASSIUM 25 MG PO TABS: 25.0000 mg | ORAL_TABLET | Freq: Every day | ORAL | Status: DC

## 2011-04-13 NOTE — Patient Instructions (Addendum)
Start losartan 25mg  daily.  Your physician recommends that you return for a FASTING lipid profile /liver profile/BMET/CBC/B12 level/ HGB A1C in about 1 week---414.05  272.0  Your physician wants you to follow-up in: 6 months with Dr Shirlee Latch. (April  2013). You will receive a reminder letter in the mail two months in advance. If you don't receive a letter, please call our office to schedule the follow-up appointment.

## 2011-04-14 ENCOUNTER — Encounter: Payer: Self-pay | Admitting: Cardiology

## 2011-04-14 DIAGNOSIS — G629 Polyneuropathy, unspecified: Secondary | ICD-10-CM | POA: Insufficient documentation

## 2011-04-14 NOTE — Assessment & Plan Note (Signed)
Check BMET on low dose ARB.

## 2011-04-14 NOTE — Progress Notes (Signed)
70 yo with history of CAD s/p CABG presents for cardiology followup.  Patient has been seen by Dr. Deborah Chalk in the past and is seen by me for the first time today.  He had initial CABG back in 1995.  Following this, he had an inferior MI and had PCI to both native RCA and SVG-RCA.  Catheterization in 1/12 showed 80% instent restenosis in the native RCA but SVG-RCA was patent so no intervention.    Patient has been symptomatically stable.  No exertional chest pain or dyspnea.  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  He occasionally has some imbalance, noticing this especially when getting in or out of a boat.  SBP has been running < 140 at home when he checks. He has numbness in his feet bilaterally about midway up from the toes.    ECG: NSR, RBBB, old inferior MI  Labs (1/12): K 4.7, creatinine 1.7  PMH: 1. Hiatal hernia with paraesophageal component. 2. Chronic RBBB 3. Gout  4. Hyperlipidemia 5. Obesity 6. HTN 7. H/o vertigo 8. CKD 9. CAD: s/p CABG in 1995 with LIMA-LAD, SVG-RCA, SVG-OM.  Then had inferior MI with PCI to both SVG-RCA and native RCA.  LHC (1/12): 100% LAD occlusion, 100% CFX occlusion, up to 80% instent restenosis in the native RCA.  The SVG-PDA, SVG-OM, and LIMA-LAD were all patent.   SH: Lives in [place].  Prior smoker.  Retired Psychologist, counselling.  Married.  Likes to hunt.   FH: Noncontributory.   ROS: All systems reviewed and negative except as per HPI.   Current Outpatient Prescriptions  Medication Sig Dispense Refill  . aspirin 325 MG tablet Take 325 mg by mouth daily.        . fenofibrate (TRICOR) 145 MG tablet Take 160 mg by mouth daily.       . nitroGLYCERIN (NITROSTAT) 0.4 MG SL tablet Place 0.4 mg under the tongue every 5 (five) minutes as needed.        . pantoprazole (PROTONIX) 40 MG tablet Take 1 tablet (40 mg total) by mouth daily.  90 tablet  0  . simvastatin (ZOCOR) 20 MG tablet Take 1 tablet (20 mg total) by mouth at bedtime.  90 tablet  2   . losartan (COZAAR) 25 MG tablet Take 1 tablet (25 mg total) by mouth daily.  30 tablet  11    BP 134/80  Pulse 56  Ht 5\' 9"  (1.753 m)  Wt 109.317 kg (241 lb)  BMI 35.59 kg/m2 General: NAD, overweight.  Neck: No JVD, no thyromegaly or thyroid nodule.  Lungs: Clear to auscultation bilaterally with normal respiratory effort. CV: Nondisplaced PMI.  Heart regular S1/S2, no S3/S4, no murmur.  No peripheral edema.  No carotid bruit.  Normal pedal pulses.  Abdomen: Soft, nontender, no hepatosplenomegaly, no distention.  Neurologic: Alert and oriented x 3.  Psych: Normal affect. Extremities: No clubbing or cyanosis.

## 2011-04-14 NOTE — Assessment & Plan Note (Signed)
BP seems to be well controlled

## 2011-04-14 NOTE — Assessment & Plan Note (Signed)
Patient's bilateral foot numbness sounds consistent with peripheral neuropathy. No history of diabetes.  Will check HgbA1c and B12 level as well.

## 2011-04-14 NOTE — Assessment & Plan Note (Signed)
Stable with no ischemic symptoms.  Continue ASA, statin.  Will add low dose ARB for secondary prevention (had been Dr. Ronnald Nian plan from this summer).  Will get BMET in 1 week.

## 2011-04-14 NOTE — Assessment & Plan Note (Signed)
Check lipids/LFTs.  

## 2011-04-21 ENCOUNTER — Other Ambulatory Visit: Payer: Medicare Other | Admitting: *Deleted

## 2011-04-22 ENCOUNTER — Telehealth: Payer: Self-pay | Admitting: Cardiovascular Disease

## 2011-04-22 NOTE — Telephone Encounter (Signed)
Pt calls today b/c he has not been taking his zocor for approx. 3 months.  States he "ran out" and forgot to mention this when he saw Dr. Shirlee Latch last week. I have rescheduled his labs for 06/01/11. Pt will remember as this is his wife's birthday. I called [facility] outpt pharmacy. They received the refill on 10/17 but it was not picked up.  They will refill today and pt will restart today with repeat labs in 6 weeks. Mylo Red RN

## 2011-04-22 NOTE — Telephone Encounter (Signed)
New problem:  Discuss restarting cholesterol med's- pt hasn't been taken his med x 3 weeks.

## 2011-06-01 ENCOUNTER — Other Ambulatory Visit: Payer: Self-pay | Admitting: Nurse Practitioner

## 2011-06-01 ENCOUNTER — Other Ambulatory Visit (INDEPENDENT_AMBULATORY_CARE_PROVIDER_SITE_OTHER): Payer: Medicare Other | Admitting: *Deleted

## 2011-06-01 DIAGNOSIS — E785 Hyperlipidemia, unspecified: Secondary | ICD-10-CM

## 2011-06-01 DIAGNOSIS — I25810 Atherosclerosis of coronary artery bypass graft(s) without angina pectoris: Secondary | ICD-10-CM

## 2011-06-01 DIAGNOSIS — E7800 Pure hypercholesterolemia, unspecified: Secondary | ICD-10-CM

## 2011-06-01 LAB — LIPID PANEL
Cholesterol: 191 mg/dL (ref 0–200)
HDL: 38.6 mg/dL — ABNORMAL LOW (ref >39.00)
Total CHOL/HDL Ratio: 5
Triglycerides: 223 mg/dL — ABNORMAL HIGH (ref 0.0–149.0)
VLDL: 44.6 mg/dL — ABNORMAL HIGH (ref 0.0–40.0)

## 2011-06-01 LAB — LDL CHOLESTEROL, DIRECT: Direct LDL: 114 mg/dL

## 2011-06-02 ENCOUNTER — Telehealth: Payer: Self-pay | Admitting: Cardiology

## 2011-06-02 DIAGNOSIS — E7800 Pure hypercholesterolemia, unspecified: Secondary | ICD-10-CM

## 2011-06-02 LAB — HEPATIC FUNCTION PANEL
ALT: 53 U/L (ref 0–53)
AST: 44 U/L — ABNORMAL HIGH (ref 0–37)
Albumin: 3.9 g/dL (ref 3.5–5.2)
Alkaline Phosphatase: 35 U/L — ABNORMAL LOW (ref 39–117)
Bilirubin, Direct: 0 mg/dL (ref 0.0–0.3)
Total Bilirubin: 0.6 mg/dL (ref 0.3–1.2)
Total Protein: 7.4 g/dL (ref 6.0–8.3)

## 2011-06-02 NOTE — Telephone Encounter (Addendum)
Pt rtn call re lab results, pt wants copy of report on his heart function dr Deborah Chalk gave him, lost it, and xray report on his stomach, also requesting GI referral

## 2011-06-02 NOTE — Telephone Encounter (Signed)
LMTCB

## 2011-06-03 MED ORDER — SIMVASTATIN 40 MG PO TABS: 40.0000 mg | ORAL_TABLET | Freq: Every evening | ORAL | Status: DC

## 2011-06-03 NOTE — Telephone Encounter (Signed)
Patient was called and told LDL 114,goal <70.Advised to increase Zocor to 40 mg at night a better diet,exercise.Patient stated he joined [facility] yesterday.Repeat lipids/liver in 3 months.

## 2011-06-03 NOTE — Telephone Encounter (Signed)
Fu call °Patient returning your call °

## 2011-06-29 ENCOUNTER — Telehealth: Payer: Self-pay | Admitting: Cardiology

## 2011-06-29 NOTE — Telephone Encounter (Signed)
LOV x4,Cath,12 lead faxed to [facility]/Dr.[name] @  [phone]   06/29/11/KM

## 2011-07-16 ENCOUNTER — Other Ambulatory Visit: Payer: Self-pay | Admitting: Cardiology

## 2011-07-16 NOTE — Telephone Encounter (Signed)
Pt is going out of town in may

## 2011-07-17 ENCOUNTER — Other Ambulatory Visit: Payer: Self-pay

## 2011-07-17 MED ORDER — PANTOPRAZOLE SODIUM 40 MG PO TBEC: 40.0000 mg | DELAYED_RELEASE_TABLET | Freq: Every day | ORAL | Status: DC

## 2011-08-07 ENCOUNTER — Other Ambulatory Visit: Payer: Self-pay

## 2011-08-07 DIAGNOSIS — E7800 Pure hypercholesterolemia, unspecified: Secondary | ICD-10-CM

## 2011-08-07 MED ORDER — SIMVASTATIN 40 MG PO TABS: 40.0000 mg | ORAL_TABLET | Freq: Every evening | ORAL | Status: DC

## 2011-09-01 ENCOUNTER — Other Ambulatory Visit: Payer: Medicare Other

## 2011-09-02 ENCOUNTER — Encounter (HOSPITAL_COMMUNITY): Payer: Self-pay | Admitting: *Deleted

## 2011-09-02 ENCOUNTER — Emergency Department (HOSPITAL_COMMUNITY): Payer: Medicare Other

## 2011-09-02 ENCOUNTER — Emergency Department (HOSPITAL_COMMUNITY)
Admission: EM | Admit: 2011-09-02 | Discharge: 2011-09-02 | Disposition: A | Discharge status: Home / Self Care | Payer: Medicare Other | Attending: Emergency Medicine | Admitting: Emergency Medicine

## 2011-09-02 DIAGNOSIS — H539 Unspecified visual disturbance: Secondary | ICD-10-CM

## 2011-09-02 DIAGNOSIS — I10 Essential (primary) hypertension: Secondary | ICD-10-CM | POA: Insufficient documentation

## 2011-09-02 DIAGNOSIS — I252 Old myocardial infarction: Secondary | ICD-10-CM | POA: Insufficient documentation

## 2011-09-02 DIAGNOSIS — H547 Unspecified visual loss: Secondary | ICD-10-CM | POA: Insufficient documentation

## 2011-09-02 DIAGNOSIS — I25810 Atherosclerosis of coronary artery bypass graft(s) without angina pectoris: Secondary | ICD-10-CM | POA: Insufficient documentation

## 2011-09-02 LAB — POCT I-STAT, CHEM 8
BUN: 16 mg/dL (ref 6–23)
Calcium, Ion: 1.19 mmol/L (ref 1.12–1.32)
Chloride: 108 mEq/L (ref 96–112)
Creatinine, Ser: 1.4 mg/dL — ABNORMAL HIGH (ref 0.50–1.35)
Glucose, Bld: 107 mg/dL — ABNORMAL HIGH (ref 70–99)
HCT: 45 % (ref 39.0–52.0)
Hemoglobin: 15.3 g/dL (ref 13.0–17.0)
Potassium: 4.1 mEq/L (ref 3.5–5.1)
Sodium: 141 mEq/L (ref 135–145)
TCO2: 25 mmol/L (ref 0–100)

## 2011-09-02 LAB — POCT I-STAT TROPONIN I: Troponin i, poc: 0 ng/mL (ref 0.00–0.08)

## 2011-09-02 NOTE — ED Notes (Signed)
Pt states R sided vision continues to be blurred.

## 2011-09-02 NOTE — ED Provider Notes (Signed)
History     CSN: [id number]  Arrival date & time 09/02/11  1528   First MD Initiated Contact with Patient 09/02/11 1829      Chief Complaint  Patient presents with  . Blurred Vision    (Consider location/radiation/quality/duration/timing/severity/associated sxs/prior treatment) Patient is a 71 y.o. male presenting with eye problem. The history is provided by the patient.  Eye Problem  This is a new problem. The current episode started 6 to 12 hours ago. The problem occurs constantly. The problem has been resolved. There is pain in both eyes. There was no injury mechanism. The pain is at a severity of 0/10. The patient is experiencing no pain. There is no history of trauma to the eye. There is no known exposure to pink eye. He does not wear contacts. Associated symptoms include blurred vision and decreased vision. Pertinent negatives include no numbness, no eye redness and no weakness. Associated symptoms comments: Lasted 45 minutes today before resolved. He has tried nothing for the symptoms. The treatment provided significant relief.    Past Medical History  Diagnosis Date  . Chest pain   . Coronary artery disease   . MI, old     INFERIOR  . RBBB (right bundle branch block)   . Gout   . Hyperlipidemia   . Obesity   . Hypertension   . ED (erectile dysfunction)   . Vertigo   . Hx of CABG 1995    Past Surgical History  Procedure Date  . Cardiac catheterization 06/04/2010    Grafts patent, stents patent but has progression of disese in the native RCA  . Coronary angioplasty with stent placement 09/24/2008    stent in SVG to RCA and stenting of native RCA  . Cardiac catheterization 08/19/2008  . Coronary artery bypass graft 1995    LIMA to LAD, SVG to RCA, SVG to OM.     Family History  Problem Relation Age of Onset  . Heart disease Father     History  Substance Use Topics  . Smoking status: Former Smoker -- 1.0 packs/day for 9 years    Types: Cigarettes    Quit date:  10/31/1970  . Smokeless tobacco: Former Neurosurgeon    Types: Chew   Comment: chewed for 2 years  . Alcohol Use: No      Review of Systems  Eyes: Positive for blurred vision. Negative for redness.  Neurological: Negative for weakness and numbness.  All other systems reviewed and are negative.    Allergies  Review of patient's allergies indicates no known allergies.  Home Medications   Current Outpatient Rx  Name Route Sig Dispense Refill  . ASPIRIN 325 MG PO TABS Oral Take 325 mg by mouth daily.      Marland Kitchen LOSARTAN POTASSIUM 25 MG PO TABS Oral Take 25 mg by mouth daily.    Marland Kitchen NITROGLYCERIN 0.4 MG SL SUBL Sublingual Place 0.4 mg under the tongue every 5 (five) minutes as needed. For chest pain    . PANTOPRAZOLE SODIUM 40 MG PO TBEC Oral Take 40 mg by mouth daily.    Marland Kitchen SIMVASTATIN 40 MG PO TABS Oral Take 40 mg by mouth every evening.      BP 117/70  Pulse 60  Temp 98.2 F (36.8 C)  Resp 20  SpO2 95%  Physical Exam  Nursing note and vitals reviewed. Constitutional: He is oriented to person, place, and time. He appears well-developed and well-nourished. No distress.  HENT:  Head: Normocephalic and atraumatic.  Mouth/Throat: Oropharynx is clear and moist.  Eyes: Conjunctivae and EOM are normal. Pupils are equal, round, and reactive to light.  Neck: Normal range of motion. Neck supple.  Cardiovascular: Normal rate, regular rhythm and intact distal pulses.   No murmur heard. Pulmonary/Chest: Effort normal and breath sounds normal. No respiratory distress. He has no wheezes. He has no rales.  Abdominal: Soft. He exhibits no distension. There is no tenderness. There is no rebound and no guarding.  Musculoskeletal: Normal range of motion. He exhibits no edema and no tenderness.  Neurological: He is alert and oriented to person, place, and time. He has normal strength. No cranial nerve deficit or sensory deficit. Coordination normal.       Right-sided hemianopia  Skin: Skin is warm and  dry. No rash noted. No erythema.  Psychiatric: He has a normal mood and affect. His behavior is normal.    ED Course  Procedures (including critical care time)  Labs Reviewed  POCT I-STAT, CHEM 8 - Abnormal; Notable for the following:    Creatinine, Ser 1.40 (*)    Glucose, Bld 107 (*)    All other components within normal limits  POCT I-STAT TROPONIN I   Ct Head Wo Contrast  09/02/2011  *RADIOLOGY REPORT*  Clinical Data: Visual changes earlier today.  CT HEAD WITHOUT CONTRAST  Technique:  Contiguous axial images were obtained from the base of the skull through the vertex without contrast.  Comparison: MRI brain 09/19/2008.  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  No acute cortical infarct, hemorrhage, mass lesion is present.  There is some ex vacuo dilation of the posterior left lateral ventricle. Ventricles are otherwise within normal limits for size.  No significant extra-axial fluid collection is present.  The paranasal sinuses and mastoid air cells are clear.  The osseous skull is intact.  IMPRESSION:  1.  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 2.  No acute intracranial abnormality.  Original Report Authenticated By: Jamesetta Orleans. MATTERN, M.D.     1. Vision abnormalities       MDM   Patient with multiple medical problems including cardiac catheterization and prior MI. 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 However today for 45 minutes patient had bilateral blurred vision. He denies any headache, weakness, paresthesias, slurred speech or other findings at the time. After he ate his symptoms went away however he has no history of diabetes. Since noon today he has been asymptomatic. On exam now it is within normal limits.  Patient symptoms could be from a TIA however his symptoms are unusual. His vision is unchanged from his baseline now. He denies any recent medications but did stop  taking his anti-triglyceride medication months ago when he ran out.  Will get a head CT, i-stat and trop.  All testing within normal limits.  Patient remains asymptomatic will have him followup with his regular doctor for further testing.        Gwyneth Sprout, MD 09/02/11 2329

## 2011-09-02 NOTE — ED Notes (Addendum)
Pt claimed that he started having blurry vision onset this morning at 1100, but denies any dizziness, no headache. Pt is A/A/Ox4, skin is warm and dry, respiration is even and unlabored. Pt is NAD. Wife is at the bedside

## 2011-09-02 NOTE — ED Notes (Signed)
PT was  Out to  Eat  At 11 AM and his vision became blurry. Pt reported   His vision was better after eating a cheese sandwich . PT then drove to his home and His wife took Him to  PCP. His PCP sent him to the ED. PT denies any vision changes now . Denies any numbness or slurred speech.

## 2011-09-02 NOTE — ED Notes (Signed)
Pt was seen and examined by Dr. Plunkett 

## 2011-09-02 NOTE — Discharge Instructions (Signed)
Visual Disturbances   You have had a disturbance in your vision. This may be caused by various conditions, such as:   Migraines. Migraine headaches are often preceded by a disturbance in vision. Blind spots or light flashes are followed by a headache. This type of visual disturbance is temporary. It does not damage the eye.   Glaucoma. This is caused by increased pressure in the eye. Symptoms include haziness, blurred vision, or seeing rainbow colored circles when looking at bright lights. Partial or complete visual loss can occur. You may or may not experience eye pain. Visual loss may be gradual or sudden and is irreversible. Glaucoma is the leading cause of blindness.   Retina problems. Vision will be reduced if the retina becomes detached or if there is a circulation problem as with diabetes, high blood pressure, or a mini-stroke. Symptoms include seeing "floaters," flashes of light, or shadows, as if a curtain has fallen over your eye.   Optic nerve problems. The main nerve in your eye can be damaged by redness, soreness, and swelling (inflammation), poor circulation, drugs, and toxins.  It is very important to have a complete exam done by a specialist to determine the exact cause of your eye problem. The specialist may recommend medicines or surgery, depending on the cause of the problem. This can help prevent further loss of vision or reduce the risk of having a stroke. Contact the caregiver to whom you have been referred and arrange for follow-up care right away.   SEEK IMMEDIATE MEDICAL CARE IF:   Your vision gets worse.   You develop severe headaches.   You have any weakness or numbness in the face, arms, or legs.   You have any trouble speaking or walking.  Document Released: 06/18/2004 Document Revised: 04/30/2011 Document Reviewed: 10/09/2009   [employer] Patient Information 2012 [employer].

## 2011-10-02 ENCOUNTER — Encounter: Payer: Self-pay | Admitting: Cardiology

## 2011-10-02 ENCOUNTER — Ambulatory Visit (INDEPENDENT_AMBULATORY_CARE_PROVIDER_SITE_OTHER): Payer: Medicare Other | Admitting: Cardiology

## 2011-10-02 VITALS — BP 148/88 | HR 60 | Ht 71.0 in | Wt 243.0 lb

## 2011-10-02 DIAGNOSIS — E7800 Pure hypercholesterolemia, unspecified: Secondary | ICD-10-CM

## 2011-10-02 DIAGNOSIS — E785 Hyperlipidemia, unspecified: Secondary | ICD-10-CM

## 2011-10-02 DIAGNOSIS — G459 Transient cerebral ischemic attack, unspecified: Secondary | ICD-10-CM

## 2011-10-02 DIAGNOSIS — I2510 Atherosclerotic heart disease of native coronary artery without angina pectoris: Secondary | ICD-10-CM

## 2011-10-02 DIAGNOSIS — I25810 Atherosclerosis of coronary artery bypass graft(s) without angina pectoris: Secondary | ICD-10-CM

## 2011-10-02 MED ORDER — LOSARTAN POTASSIUM 25 MG PO TABS: 25.0000 mg | ORAL_TABLET | Freq: Every day | ORAL | Status: DC

## 2011-10-02 MED ORDER — ROSUVASTATIN CALCIUM 10 MG PO TABS: 10.0000 mg | ORAL_TABLET | Freq: Every day | ORAL | Status: DC

## 2011-10-02 NOTE — Patient Instructions (Signed)
Stop zocor(simvastatin).  Start Crestor 10mg  daily.  Take coenzyme Q10 200mg  daily.  Decrease aspirin to 81mg  daily.  Your physician has requested that you have a carotid duplex. This test is an ultrasound of the carotid arteries in your neck. It looks at blood flow through these arteries that supply the brain with blood. Allow one hour for this exam. There are no restrictions or special instructions. Have this before you you leave on your trip May 15,2013.  Your physician recommends that you return for a FASTING lipid profile/liver profile when you return from your trip around September 2013.  Your physician wants you to follow-up in: 6 months with Dr Shirlee Latch. (November 2013). You will receive a reminder letter in the mail two months in advance. If you don't receive a letter, please call our office to schedule the follow-up appointment.

## 2011-10-02 NOTE — Assessment & Plan Note (Signed)
Goal LDL < 70.  Needs to restart statin.  He had myalgias with 40 mg Zocor.  I will have him start Crestor 10 mg daily with coenzyme Q10 200 mg daily . Lipids/LFTs in 9/13 when he returns from [place].

## 2011-10-02 NOTE — Assessment & Plan Note (Signed)
Stable with no ischemic symptoms.  He needs to restart aspirin, would use 81 mg daily since he has stomach upset with 325 mg daily.  If he has problems with 81 mg ASA, he should use a PPI.

## 2011-10-02 NOTE — Progress Notes (Signed)
PCP: Dr. Tanya Nones  71 yo with history of CAD s/p CABG presents for cardiology followup.  He had initial CABG back in 1995.  Following this, he had an inferior MI and had PCI to both native RCA and SVG-RCA.  Catheterization in 1/12 showed 80% instent restenosis in the native RCA but SVG-RCA was patent so no intervention.    Patient has been symptomatically stable.  No exertional chest pain or dyspnea.  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  BP is mildly elevated today but he did not take his losartan this morning.  He has not been taking aspirin 325 mg because it burns his stomach.  He was told to increase simvastatin to 40 mg daily recently but developed myalgias with the increase so stopped taking the statin altogether.    In 4/13, patient developed brief diplopia.  He went to the ER where CT head showed no acute changes.  He has had no neurological-type symptoms since that time.    He and his wife are planning a long trip to [place], leaving later this month.   Labs (1/12): K 4.7, creatinine 1.7 Labs (1/13): LDL 114, AST 44, ALT 53, HDL 39 Labs (4/13): K 4.1, creatinine 1.4  ECG: NSR, old inferior MI, RBBB  PMH: 1. Hiatal hernia with paraesophageal component. 2. Chronic RBBB 3. Gout  4. Hyperlipidemia 5. Obesity 6. HTN 7. H/o vertigo 8. CKD 9. CAD: s/p CABG in 1995 with LIMA-LAD, SVG-RCA, SVG-OM.  Then had inferior MI with PCI to both SVG-RCA and native RCA.  LHC (1/12): 100% LAD occlusion, 100% CFX occlusion, up to 80% instent restenosis in the native RCA.  The SVG-PDA, SVG-OM, and LIMA-LAD were all patent.   SH: Lives in [place].  Prior smoker.  Retired Psychologist, counselling.  Married.  Likes to hunt.   FH: Noncontributory.   ROS: All systems reviewed and negative except as per HPI.   Current Outpatient Prescriptions  Medication Sig Dispense Refill  . COLCRYS 0.6 MG tablet       . losartan (COZAAR) 25 MG tablet Take 1 tablet (25 mg total) by mouth daily.  90 tablet  3   . nitroGLYCERIN (NITROSTAT) 0.4 MG SL tablet Place 0.4 mg under the tongue every 5 (five) minutes as needed. For chest pain      . pantoprazole (PROTONIX) 40 MG tablet Take 40 mg by mouth daily.      Marland Kitchen DISCONTD: aspirin 325 MG tablet Take 325 mg by mouth daily.        Marland Kitchen DISCONTD: losartan (COZAAR) 25 MG tablet Take 25 mg by mouth daily.      Marland Kitchen aspirin EC 81 MG tablet Take 1 tablet (81 mg total) by mouth daily.      . Coenzyme Q10 200 MG TABS One daily    0  . rosuvastatin (CRESTOR) 10 MG tablet Take 1 tablet (10 mg total) by mouth daily.  90 tablet  3    BP 148/88  Pulse 60  Ht 5\' 11"  (1.803 m)  Wt 243 lb (110.224 kg)  BMI 33.89 kg/m2 General: NAD, overweight.  Neck: Thick, no JVD, no thyromegaly or thyroid nodule.  Lungs: Clear to auscultation bilaterally with normal respiratory effort. CV: Nondisplaced PMI.  Heart regular S1/S2, no S3/S4, no murmur.  No peripheral edema.  No carotid bruit.  Normal pedal pulses.  Abdomen: Soft, nontender, no hepatosplenomegaly, no distention.  Neurologic: Alert and oriented x 3.  Psych: Normal affect. Extremities: No clubbing or  cyanosis.

## 2011-10-02 NOTE — Assessment & Plan Note (Signed)
Episode of diplopia in 4/13, cannot rule out TIA.  As above, restart ASA and statin.  I will also get carotid dopplers.

## 2011-10-05 ENCOUNTER — Encounter (INDEPENDENT_AMBULATORY_CARE_PROVIDER_SITE_OTHER): Payer: Medicare Other

## 2011-10-05 DIAGNOSIS — G459 Transient cerebral ischemic attack, unspecified: Secondary | ICD-10-CM

## 2011-10-05 DIAGNOSIS — I6529 Occlusion and stenosis of unspecified carotid artery: Secondary | ICD-10-CM

## 2011-10-05 DIAGNOSIS — H53129 Transient visual loss, unspecified eye: Secondary | ICD-10-CM

## 2011-10-08 ENCOUNTER — Telehealth: Payer: Self-pay | Admitting: Cardiology

## 2011-10-08 NOTE — Telephone Encounter (Signed)
Spoke with pt about recent carotid doppler results. 

## 2011-10-08 NOTE — Telephone Encounter (Signed)
Patient returning nurse AL call, he can be reached at [phone]

## 2012-01-26 ENCOUNTER — Other Ambulatory Visit (INDEPENDENT_AMBULATORY_CARE_PROVIDER_SITE_OTHER): Payer: Medicare Other

## 2012-01-26 DIAGNOSIS — I25810 Atherosclerosis of coronary artery bypass graft(s) without angina pectoris: Secondary | ICD-10-CM

## 2012-01-26 DIAGNOSIS — G459 Transient cerebral ischemic attack, unspecified: Secondary | ICD-10-CM

## 2012-01-26 DIAGNOSIS — E7800 Pure hypercholesterolemia, unspecified: Secondary | ICD-10-CM

## 2012-01-26 LAB — LIPID PANEL
Cholesterol: 170 mg/dL (ref 0–200)
HDL: 38.6 mg/dL — ABNORMAL LOW (ref >39.00)
Total CHOL/HDL Ratio: 4
Triglycerides: 221 mg/dL — ABNORMAL HIGH (ref 0.0–149.0)
VLDL: 44.2 mg/dL — ABNORMAL HIGH (ref 0.0–40.0)

## 2012-01-26 LAB — HEPATIC FUNCTION PANEL
ALT: 34 U/L (ref 0–53)
AST: 36 U/L (ref 0–37)
Albumin: 3.8 g/dL (ref 3.5–5.2)
Alkaline Phosphatase: 44 U/L (ref 39–117)
Bilirubin, Direct: 0.1 mg/dL (ref 0.0–0.3)
Total Bilirubin: 0.7 mg/dL (ref 0.3–1.2)
Total Protein: 7.5 g/dL (ref 6.0–8.3)

## 2012-01-26 LAB — LDL CHOLESTEROL, DIRECT: Direct LDL: 99.7 mg/dL

## 2012-01-29 ENCOUNTER — Telehealth: Payer: Self-pay | Admitting: Cardiology

## 2012-01-29 NOTE — Telephone Encounter (Signed)
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 He was seen by his PCP yesterday and it was confirmed the pain he is having is his arthritis and the Chris [name] feels it is related to the crestor. The Chris [name] is going to stop the crestor. Will make dr Shirlee Latch aware.

## 2012-01-29 NOTE — Telephone Encounter (Signed)
Stop Crestor for 2 wks.  If symptoms are better, may have been due to statin.  If not, probably arthritis and should restart statin.  If symptoms are better, would suggest that he try pravastatin 40 mg daily after 2 wks with coenzyme Q10 200 mg daily.  Would like him on some form of statin to prevent progression of coronary disease.  Pravastatin probably the least likely to cause myalgias (but also weakest).

## 2012-01-29 NOTE — Telephone Encounter (Signed)
NEW MESSAGE:  PT CALLED AND STATED HE IS HAVING A PROBLEM WITH HIS CHOL. MEDICATION.  PLEASE CALL HIM REGARDING THIS PROBLEM. WANTS TO SPEAK TO SOMEONE REGARDING THIS TODAY.

## 2012-02-02 NOTE — Telephone Encounter (Signed)
Spoke with pt. Pt will stay off crestor for now. He will call back in 2 weeks and let us know if his symptoms are improved off crestor. He is not enthusiastic about trying a different cholesterol med.

## 2012-02-09 ENCOUNTER — Other Ambulatory Visit: Payer: Self-pay | Admitting: Gastroenterology

## 2012-06-03 ENCOUNTER — Telehealth: Payer: Self-pay | Admitting: Cardiology

## 2012-06-03 ENCOUNTER — Observation Stay (HOSPITAL_COMMUNITY)
Admission: EM | Admit: 2012-06-03 | Discharge: 2012-06-04 | Disposition: A | Discharge status: Home / Self Care | Payer: Medicare Other | Attending: Cardiology | Admitting: Cardiology

## 2012-06-03 ENCOUNTER — Encounter (HOSPITAL_COMMUNITY): Payer: Self-pay | Admitting: Family Medicine

## 2012-06-03 ENCOUNTER — Emergency Department (HOSPITAL_COMMUNITY): Payer: Medicare Other

## 2012-06-03 DIAGNOSIS — R0989 Other specified symptoms and signs involving the circulatory and respiratory systems: Secondary | ICD-10-CM | POA: Insufficient documentation

## 2012-06-03 DIAGNOSIS — R079 Chest pain, unspecified: Secondary | ICD-10-CM

## 2012-06-03 DIAGNOSIS — K449 Diaphragmatic hernia without obstruction or gangrene: Secondary | ICD-10-CM | POA: Insufficient documentation

## 2012-06-03 DIAGNOSIS — R0609 Other forms of dyspnea: Secondary | ICD-10-CM | POA: Insufficient documentation

## 2012-06-03 DIAGNOSIS — I10 Essential (primary) hypertension: Secondary | ICD-10-CM | POA: Insufficient documentation

## 2012-06-03 DIAGNOSIS — I25810 Atherosclerosis of coronary artery bypass graft(s) without angina pectoris: Secondary | ICD-10-CM

## 2012-06-03 DIAGNOSIS — Z951 Presence of aortocoronary bypass graft: Secondary | ICD-10-CM | POA: Insufficient documentation

## 2012-06-03 DIAGNOSIS — E781 Pure hyperglyceridemia: Secondary | ICD-10-CM | POA: Insufficient documentation

## 2012-06-03 DIAGNOSIS — R072 Precordial pain: Principal | ICD-10-CM

## 2012-06-03 HISTORY — DX: Umbilical hernia without obstruction or gangrene: K42.9

## 2012-06-03 LAB — COMPREHENSIVE METABOLIC PANEL
ALT: 31 U/L (ref 0–53)
AST: 34 U/L (ref 0–37)
Albumin: 3.6 g/dL (ref 3.5–5.2)
Alkaline Phosphatase: 52 U/L (ref 39–117)
BUN: 14 mg/dL (ref 6–23)
CO2: 25 mEq/L (ref 19–32)
Calcium: 9.7 mg/dL (ref 8.4–10.5)
Chloride: 102 mEq/L (ref 96–112)
Creatinine, Ser: 1.23 mg/dL (ref 0.50–1.35)
GFR calc Af Amer: 67 mL/min — ABNORMAL LOW (ref >90)
GFR calc non Af Amer: 58 mL/min — ABNORMAL LOW (ref >90)
Glucose, Bld: 93 mg/dL (ref 70–99)
Potassium: 4.3 mEq/L (ref 3.5–5.1)
Sodium: 139 mEq/L (ref 135–145)
Total Bilirubin: 0.3 mg/dL (ref 0.3–1.2)
Total Protein: 7.7 g/dL (ref 6.0–8.3)

## 2012-06-03 LAB — CBC WITH DIFFERENTIAL/PLATELET
Basophils Absolute: 0 10*3/uL (ref 0.0–0.1)
Basophils Relative: 1 % (ref 0–1)
Eosinophils Absolute: 0.2 10*3/uL (ref 0.0–0.7)
Eosinophils Relative: 3 % (ref 0–5)
HCT: 44.4 % (ref 39.0–52.0)
Hemoglobin: 15.2 g/dL (ref 13.0–17.0)
Lymphocytes Relative: 33 % (ref 12–46)
Lymphs Abs: 2 10*3/uL (ref 0.7–4.0)
MCH: 31.7 pg (ref 26.0–34.0)
MCHC: 34.2 g/dL (ref 30.0–36.0)
MCV: 92.7 fL (ref 78.0–100.0)
Monocytes Absolute: 0.6 10*3/uL (ref 0.1–1.0)
Monocytes Relative: 10 % (ref 3–12)
Neutro Abs: 3.3 10*3/uL (ref 1.7–7.7)
Neutrophils Relative %: 53 % (ref 43–77)
Platelets: 187 10*3/uL (ref 150–400)
RBC: 4.79 MIL/uL (ref 4.22–5.81)
RDW: 13.3 % (ref 11.5–15.5)
WBC: 6.1 10*3/uL (ref 4.0–10.5)

## 2012-06-03 LAB — POCT I-STAT TROPONIN I: Troponin i, poc: 0.01 ng/mL (ref 0.00–0.08)

## 2012-06-03 LAB — PROTIME-INR
INR: 0.98 (ref 0.00–1.49)
Prothrombin Time: 12.9 seconds (ref 11.6–15.2)

## 2012-06-03 LAB — TROPONIN I: Troponin I: <0.3 ng/mL (ref <0.30)

## 2012-06-03 MED ORDER — HEPARIN SODIUM (PORCINE) 5000 UNIT/ML IJ SOLN
5000.0000 [IU] | Freq: Three times a day (TID) | INTRAMUSCULAR | Status: DC
  Filled 2012-06-03 (×5): qty 1

## 2012-06-03 MED ORDER — SODIUM CHLORIDE 0.9 % IJ SOLN
3.0000 mL | Freq: Two times a day (BID) | INTRAMUSCULAR | Status: DC
  Administered 2012-06-03: 3 mL via INTRAVENOUS

## 2012-06-03 MED ORDER — ASPIRIN EC 81 MG PO TBEC
81.0000 mg | DELAYED_RELEASE_TABLET | Freq: Every day | ORAL | Status: DC
  Administered 2012-06-04: 81 mg via ORAL
  Filled 2012-06-03 (×2): qty 1

## 2012-06-03 MED ORDER — HEPARIN (PORCINE) IN NACL 100-0.45 UNIT/ML-% IJ SOLN
1250.0000 [IU]/h | INTRAMUSCULAR | Status: DC
  Administered 2012-06-03: 1250 [IU]/h via INTRAVENOUS
  Filled 2012-06-03: qty 250

## 2012-06-03 MED ORDER — ATORVASTATIN CALCIUM 20 MG PO TABS
20.0000 mg | ORAL_TABLET | Freq: Every day | ORAL | Status: DC
  Filled 2012-06-03: qty 1

## 2012-06-03 MED ORDER — LOSARTAN POTASSIUM 25 MG PO TABS
25.0000 mg | ORAL_TABLET | Freq: Every day | ORAL | Status: DC
  Administered 2012-06-04: 25 mg via ORAL
  Filled 2012-06-03: qty 1

## 2012-06-03 MED ORDER — ALLOPURINOL 100 MG PO TABS
100.0000 mg | ORAL_TABLET | Freq: Every day | ORAL | Status: DC
  Administered 2012-06-04: 100 mg via ORAL
  Filled 2012-06-03: qty 1

## 2012-06-03 MED ORDER — PANTOPRAZOLE SODIUM 40 MG PO TBEC
40.0000 mg | DELAYED_RELEASE_TABLET | Freq: Every day | ORAL | Status: DC
  Administered 2012-06-04: 40 mg via ORAL
  Filled 2012-06-03: qty 1

## 2012-06-03 MED ORDER — ACETAMINOPHEN 325 MG PO TABS: 650.0000 mg | ORAL_TABLET | ORAL | Status: DC | PRN

## 2012-06-03 MED ORDER — SODIUM CHLORIDE 0.9 % IV SOLN: 250.0000 mL | INTRAVENOUS | Status: DC | PRN

## 2012-06-03 MED ORDER — ONDANSETRON HCL 4 MG/2ML IJ SOLN: 4.0000 mg | Freq: Four times a day (QID) | INTRAMUSCULAR | Status: DC | PRN

## 2012-06-03 MED ORDER — NITROGLYCERIN 0.4 MG SL SUBL
0.4000 mg | SUBLINGUAL_TABLET | SUBLINGUAL | Status: DC | PRN
  Administered 2012-06-03: 0.4 mg via SUBLINGUAL
  Filled 2012-06-03: qty 25

## 2012-06-03 MED ORDER — HEPARIN BOLUS VIA INFUSION
4000.0000 [IU] | Freq: Once | INTRAVENOUS | Status: AC
  Administered 2012-06-03: 4000 [IU] via INTRAVENOUS

## 2012-06-03 MED ORDER — ASPIRIN 81 MG PO CHEW
324.0000 mg | CHEWABLE_TABLET | Freq: Once | ORAL | Status: AC
  Administered 2012-06-03: 324 mg via ORAL
  Filled 2012-06-03: qty 4

## 2012-06-03 MED ORDER — SODIUM CHLORIDE 0.9 % IJ SOLN: 3.0000 mL | INTRAMUSCULAR | Status: DC | PRN

## 2012-06-03 MED ORDER — NITROGLYCERIN 0.4 MG SL SUBL: 0.4000 mg | SUBLINGUAL_TABLET | SUBLINGUAL | Status: DC | PRN

## 2012-06-03 MED ORDER — NITROGLYCERIN IN D5W 200-5 MCG/ML-% IV SOLN
2.0000 ug/min | Freq: Once | INTRAVENOUS | Status: DC
  Filled 2012-06-03: qty 250

## 2012-06-03 MED ORDER — SODIUM CHLORIDE 0.9 % IV SOLN
Freq: Once | INTRAVENOUS | Status: AC
  Administered 2012-06-03: 500 mL via INTRAVENOUS

## 2012-06-03 NOTE — ED Notes (Signed)
Admitting MD, Dr. Antoine Poche at bedside.

## 2012-06-03 NOTE — ED Notes (Signed)
C/o left side CP radiaiting into LUE x 3 days. Pain worse with mvmt & palpation. Denies SOB, n/v, diaphoresis

## 2012-06-03 NOTE — Telephone Encounter (Signed)
New problem:    1. C/O chest pain wants to know can he come by the office.   2. Refill request  Nitro 0.4 mg   [facility]  [phone]

## 2012-06-03 NOTE — ED Notes (Signed)
Per pt sts chest pain that started a few days ago with SOB, sts pain on the right side. sts hurts when he is just sitting there. Pt cardiac hx

## 2012-06-03 NOTE — H&P (Signed)
Patient ID: Chris [name] MRN: [id number], DOB/AGE: Apr 19, 1941   Admit date: 06/03/2012   Primary Physician: Leo Grosser, MD Primary Cardiologist: Golden Circle, MD  Pt. Profile:  72 year old male with history of CAD status post multiple interventions and CABG who presents secondary to progressively worsening chest pain.   Problem List  Past Medical History  Diagnosis Date  . Coronary artery disease     a. 1995 s/p CABG x 3 (VG->RCA, LIMA->LAD, VG->OM);  b. 07/2008 Inf MI/Cath/PCI: VG->RCA 99 - treated with Taxus DES (38mm), LIMA and VG->OM patent, LAD 100, LCX 100, RCA 60d, EF 50%;  c. 09/2008 PCI native RCA  w/ 2.5x23 Xience DES, VG->RCA stent patent;  c. 05/2010 Cath: Native 3VD with 3/3 patent grafts, native RCA w 80% ISR prox to graft insertion-->Med Rx.  . RBBB (right bundle branch block)   . Gout   . Hyperlipidemia   . Obesity   . Hypertension   . ED (erectile dysfunction)   . Vertigo   . Umbilical hernia     a. s/p repair.    Past Surgical History  Procedure Date  . Coronary artery bypass graft 1995    LIMA to LAD, SVG to RCA, SVG to OM.   Marland Kitchen Umbilical hernia repair 12/2007     Allergies  No Known Allergies  HPI  72 year old male with the above complex problem list. He is status post coronary artery bypass grafting in 1995 with subsequent percutaneous interventions to the vein graft to the right coronary artery as well as native right coronary artery in 2010 following acute inferior ST elevation MI. His last catheterization was in 2012 showing 3 of 3 patent grafts with proximal RCA disease. His been medically managed. Patient reports that since his stent placement in 2010, he has had episodic mild, 1-3/10 chest discomfort occurring most exclusively at rest, approximately twice a week, without associated symptoms, sometimes lasting hours at a time, and resolving spontaneously. Over the past few weeks, symptoms have been occurring on almost daily basis and have  increased in intensity to approximately 5/10. Approximately 3 days ago, patient awoke at about 5 AM with 5/10 chest discomfort without associated symptoms. Pain was slightly worse when he turns his head to the right. Symptoms persisted for the better part of 6 or 8 hours before easing down. His intermittent episodes since then and this morning had a more severe episode again without associated symptoms. He called his doctor's office and was advised to present to the ED. Here his ECG is nonacute and his first troponin is normal. History he was a little nitroglycerin with almost immediate relief of discomfort. He is currently pain-free.  Home Medications  Prior to Admission medications   Medication Sig Start Date End Date Taking? Authorizing Provider  aspirin EC 81 MG tablet Take 1 tablet (81 mg total) by mouth daily. 10/02/11  Yes Laurey Morale, MD  pantoprazole (PROTONIX) 40 MG tablet Take 40 mg by mouth daily. 07/17/11  Yes Laurey Morale, MD  rosuvastatin (CRESTOR) 10 MG tablet Take 10 mg by mouth daily.   Yes Historical Provider, MD  ALLOPURINOL PO Take 1 tablet by mouth daily.    Historical Provider, MD  COLCRYS 0.6 MG tablet  08/01/11   Historical Provider, MD  losartan (COZAAR) 25 MG tablet Take 1 tablet (25 mg total) by mouth daily. 10/02/11 10/01/12  Laurey Morale, MD  nitroGLYCERIN (NITROSTAT) 0.4 MG SL tablet Place 0.4 mg under the tongue every 5 (five)  minutes as needed. For chest pain    Historical Provider, MD   Family History  Family History  Problem Relation Age of Onset  . Heart disease Father    Social History  History   Social History  . Marital Status: Married    Spouse Name: N/A    Number of Children: N/A  . Years of Education: N/A   Occupational History  . Not on file.   Social History Main Topics  . Smoking status: Former Smoker -- 1.0 packs/day for 9 years    Types: Cigarettes    Quit date: 10/31/1970  . Smokeless tobacco: Former Neurosurgeon    Types: Chew      Comment: chewed for 2 years  . Alcohol Use: No  . Drug Use: No  . Sexually Active: Yes   Other Topics Concern  . Not on file   Social History Narrative   Lives in [place] with wife.  Retired.    Review of Systems General:  No chills, fever, night sweats or weight changes.  Cardiovascular:  He has had chest pain as outlined above. He reports some degree of chronic dyspnea exertion which he attributes to his weight.  He denies edema, orthopnea, palpitations, paroxysmal nocturnal dyspnea. Dermatological: No rash, lesions/masses Respiratory: No cough, dyspnea Urologic: No hematuria, dysuria Abdominal:   No nausea, vomiting, diarrhea, bright red blood per rectum, melena, or hematemesis Neurologic:  No visual changes, wkns, changes in mental status. All other systems reviewed and are otherwise negative except as noted above.  Physical Exam  Blood pressure 148/74, pulse 51, temperature 98.1 F (36.7 C), temperature source Oral, resp. rate 15, SpO2 96.00%.  General: Pleasant, NAD Psych: Normal affect. Neuro: Alert and oriented X 3. Moves all extremities spontaneously. HEENT: Normal  Neck: Supple without bruits or JVD. Lungs:  Resp regular and unlabored, CTA. Heart: RRR no s3, s4, or murmurs. Abdomen: Soft, non-tender, non-distended, BS + x 4.  Extremities: No clubbing, cyanosis or edema. DP/PT/Radials 2+ and equal bilaterally.  Labs  Troponin i, poc 0.01  Lab Results  Component Value Date   WBC 6.1 06/03/2012   HGB 15.2 06/03/2012   HCT 44.4 06/03/2012   MCV 92.7 06/03/2012   PLT 187 06/03/2012     Lab 06/03/12 1415  NA 139  K 4.3  CL 102  CO2 25  BUN 14  CREATININE 1.23  CALCIUM 9.7  PROT 7.7  BILITOT 0.3  ALKPHOS 52  ALT 31  AST 34  GLUCOSE 93   Radiology/Studies  Dg Chest 2 View  06/03/2012  *RADIOLOGY REPORT*  Clinical Data: 72 year old male with chest pain and shortness of breath.  CHEST - 2 VIEW  Comparison: 06/17/2009 and prior chest radiographs   Findings: Cardiomegaly, prior cardiac surgical changes and moderate to large hiatal hernia again noted. Mild peribronchial thickening is again identified. There is no evidence of focal airspace disease, pulmonary edema, suspicious pulmonary nodule/mass, pleural effusion, or pneumothorax. No acute bony abnormalities are identified.  IMPRESSION: No evidence of acute cardiopulmonary disease.  Cardiomegaly and hiatal hernia.   Original Report Authenticated By: Harmon Pier, M.D.    ECG  Sb, 57, pvc's, rbbb, inf q's - no acute st/t changes.  ASSESSMENT AND PLAN  1. Acute on chronic chest pain/coronary artery disease: Patient presents with subacute to acute worsening of chronic chest discomfort.  He he typically has chest discomfort approximately twice a week which does not limit his activities however the past few weeks has been occurring daily over  the past few days this is been more severe. Today his symptoms were relieved with nitroglycerin here in the ED. Despite prolonged symptoms, he has no objective evidence of ischemia with a normal troponin and no acute ST or T changes on his ECG. There is some musculoskeletal component to this chest discomfort as he endorses that it was hurting at times when he turned his head to the right. Will observe tonight and continue to cycle cardiac markers.  Continue home medications and add long-acting nitrate.  If enzymes are negative would likely defer additional ischemic evaluation and consider other causes of chest pain. He does have a hiatal hernia and he may require GI followup.  2. Hypertension: Blood pressure currently elevated in the ER. Continue home medications and titrate as necessary.  3. Hyperlipidemia: continue statin therapy. Check lipids and LFTs.  4. Hiatal hernia: Question contribution to symptoms. Follow up with GI as an outpatient.  Signed, Nicolasa Ducking, NP 06/03/2012, 5:07 PM  History and all data above reviewed.  Patient examined.  I agree  with the findings as above.  The patient has atypical chest pain.  It has been chronic though more severe for the last couple of days.  However, it is pain that he has had really for years.  It did go away in the ER after NTG but came back when he turned his head a certain way.  There is no objective evidence of ischemia.  The patient exam reveals COR:RRR  ,  Lungs: Clear  ,  Abd: Positive bowel sounds, no rebound no guarding, Ext No edema  .  All available labs, radiology testing, previous records reviewed. Agree with documented assessment and plan. Chest pain is atypical.  There are no acute EKG changes and POC are negative x 1.  He will be observed overnight.  If there is no change in his pain and no objective evidence of ischemia then I would not suggest further in patient studies.     Rollene Rotunda  6:35 PM  06/03/2012

## 2012-06-03 NOTE — Progress Notes (Signed)
ANTICOAGULATION CONSULT NOTE - Initial Consult  Pharmacy Consult for Heparin Indication: chest pain/ACS  No Known Allergies  Patient Measurements:  Last available weight- 110kg  Height- 5'9 (180cm) IBW- 75 kg Heparin Dosing Weight: 98.6 kg  Vital Signs: Temp: 98.1 F (36.7 C) (01/10 1302) Temp src: Oral (01/10 1302) BP: 123/72 mmHg (01/10 1500) Pulse Rate: 49  (01/10 1500)  Labs:  Basename 06/03/12 1415  HGB 15.2  HCT 44.4  PLT 187  APTT --  LABPROT --  INR --  HEPARINUNFRC --  CREATININE 1.23  CKTOTAL --  CKMB --  TROPONINI --    The CrCl is unknown because both a height and weight (above a minimum accepted value) are required for this calculation.   Medical History: Past Medical History  Diagnosis Date  . Chest pain   . Coronary artery disease   . MI, old     INFERIOR  . RBBB (right bundle branch block)   . Gout   . Hyperlipidemia   . Obesity   . Hypertension   . ED (erectile dysfunction)   . Vertigo   . Hx of CABG 1995    Home Medications: ASA, Allopurinol, Colcrys, Cozaar, Nitrostat prn, Protonix, Crestor  Assessment: 69 YOM with hx of CABG in ED with chest pain for the last several days to start IV heparin for ACS. Patient's CBC is wnl. No baseline INR is available but patient was not on anticoagulation at home other than ASA 81mg . SCr 1.23/estCrCl~80-20mL/min. No bleeding reported. Patient is being admitted for possible ACS/unstable angina.   Goal of Therapy:  Heparin level 0.3-0.7 units/ml Monitor platelets by anticoagulation protocol: Yes   Plan:  1. Heparin bolus of 4000 units x1. 2. Heparin drip at rate of 1250 units/hr (12.5 mL/hr).  3. Baseline INR prior to starting heparin.  4. Heparin level 6 hours after rate initiated. 5. Daily heparin level and CBC. 6. Confirm weight on bed scale.   Fayne Norrie 06/03/2012,3:40 PM

## 2012-06-03 NOTE — ED Provider Notes (Signed)
72 year old male with history of coronary bypass graft has been having chest pain for the last several days. Pain has been intermittent until today when it has been constant. It is not clearly exertional but he has chronic exertional dyspnea. Pain is described as dull and achy and present in the left chest with some radiation to left arm and somewhat to the back. He was awakened by pain once several nights ago. There is associated dyspnea and some occasional diaphoresis. There is no associated nausea. Exam is unremarkable. Old records were reviewed and his last catheterization was in 2012 at which point he was noted to have severe three-vessel disease but adequate revascularization. Current symptoms are worrisome for unstable angina. He did get complete relief with nitroglycerin but some pain is starting to come back. He is being started on heparin and nitroglycerin and he will be admitted for possible acute coronary syndrome/unstable angina.   Date: 06/03/2012  Rate: 57  Rhythm: sinus bradycardia and premature ventricular contractions (PVC)  QRS Axis: normal  Intervals: normal  ST/T Wave abnormalities: normal  Conduction Disutrbances:right bundle branch block  Narrative Interpretation: PVCs, right bundle branch block, low voltage. No prior ECG available for comparison.  Old EKG Reviewed: none available  Medical screening examination/treatment/procedure(s) were conducted as a shared visit with non-physician practitioner(s) and myself.  I personally evaluated the patient during the encounter   Dione Booze, MD 06/03/12 1537

## 2012-06-03 NOTE — ED Notes (Signed)
Returned from X ray to room

## 2012-06-03 NOTE — ED Provider Notes (Signed)
History     CSN: [id number]  Arrival date & time 06/03/12  1252   First MD Initiated Contact with Patient 06/03/12 1319      Chief Complaint  Patient presents with  . Chest Pain    (Consider location/radiation/quality/duration/timing/severity/associated sxs/prior treatment) HPI  Chris [name] is a 72 y.o. male complaining of x3 days  worsening chest pain described as "soreness" rated at 5/10 it has been intermittent for the last 3 days. It became more severe this morning and has turned constant. The pain radiates to left shoulder. It is nonexertional, non pleuritic.   Associated with SOB, Denies N/V Diaphoresis. Patient has ran out of his nitroglycerin and did not take any he had no aspirin today he normally takes a baby aspirin a day. She states that this does not feel like his prior heart attacks.   CABG in 95  3x stents 3x years ago  Cards: [facility]   Past Medical History  Diagnosis Date  . Chest pain   . Coronary artery disease   . MI, old     INFERIOR  . RBBB (right bundle branch block)   . Gout   . Hyperlipidemia   . Obesity   . Hypertension   . ED (erectile dysfunction)   . Vertigo   . Hx of CABG 1995    Past Surgical History  Procedure Date  . Cardiac catheterization 06/04/2010    Grafts patent, stents patent but has progression of disese in the native RCA  . Coronary angioplasty with stent placement 09/24/2008    stent in SVG to RCA and stenting of native RCA  . Cardiac catheterization 08/19/2008  . Coronary artery bypass graft 1995    LIMA to LAD, SVG to RCA, SVG to OM.     Family History  Problem Relation Age of Onset  . Heart disease Father     History  Substance Use Topics  . Smoking status: Former Smoker -- 1.0 packs/day for 9 years    Types: Cigarettes    Quit date: 10/31/1970  . Smokeless tobacco: Former Neurosurgeon    Types: Chew     Comment: chewed for 2 years  . Alcohol Use: No      Review of Systems  Constitutional: Negative for fever.    Respiratory: Positive for shortness of breath.   Cardiovascular: Positive for chest pain.  Gastrointestinal: Negative for nausea, vomiting, abdominal pain and diarrhea.  All other systems reviewed and are negative.    Allergies  Review of patient's allergies indicates no known allergies.  Home Medications   Current Outpatient Rx  Name  Route  Sig  Dispense  Refill  . ASPIRIN EC 81 MG PO TBEC   Oral   Take 1 tablet (81 mg total) by mouth daily.         Marland Kitchen COENZYME Q10 200 MG PO TABS      One daily      0   . COLCRYS 0.6 MG PO TABS               . LOSARTAN POTASSIUM 25 MG PO TABS   Oral   Take 1 tablet (25 mg total) by mouth daily.   90 tablet   3   . NITROGLYCERIN 0.4 MG SL SUBL   Sublingual   Place 0.4 mg under the tongue every 5 (five) minutes as needed. For chest pain         . PANTOPRAZOLE SODIUM 40 MG PO TBEC  Oral   Take 40 mg by mouth daily.           BP 136/73  Pulse 56  Temp 98.1 F (36.7 C) (Oral)  Resp 16  SpO2 97%  Physical Exam  Nursing note and vitals reviewed. Constitutional: He is oriented to person, place, and time. He appears well-developed and well-nourished. No distress.  HENT:  Head: Normocephalic.  Eyes: Conjunctivae normal and EOM are normal.  Cardiovascular: Normal rate, regular rhythm and intact distal pulses.   Pulmonary/Chest: Effort normal and breath sounds normal. No stridor. No respiratory distress. He has no wheezes. He has no rales. He exhibits no tenderness.  Abdominal: Soft. Bowel sounds are normal. He exhibits no distension and no mass. There is no tenderness. There is no rebound and no guarding.  Musculoskeletal: Normal range of motion.  Neurological: He is alert and oriented to person, place, and time.  Psychiatric: He has a normal mood and affect.    ED Course  Procedures (including critical care time)  Labs Reviewed  COMPREHENSIVE METABOLIC PANEL - Abnormal; Notable for the following:    GFR calc non  Af Amer 58 (*)     GFR calc Af Amer 67 (*)     All other components within normal limits  CBC WITH DIFFERENTIAL  POCT I-STAT TROPONIN I  PROTIME-INR  HEPARIN LEVEL (UNFRACTIONATED)   Dg Chest 2 View  06/03/2012  *RADIOLOGY REPORT*  Clinical Data: 72 year old male with chest pain and shortness of breath.  CHEST - 2 VIEW  Comparison: 06/17/2009 and prior chest radiographs  Findings: Cardiomegaly, prior cardiac surgical changes and moderate to large hiatal hernia again noted. Mild peribronchial thickening is again identified. There is no evidence of focal airspace disease, pulmonary edema, suspicious pulmonary nodule/mass, pleural effusion, or pneumothorax. No acute bony abnormalities are identified.  IMPRESSION: No evidence of acute cardiopulmonary disease.  Cardiomegaly and hiatal hernia.   Original Report Authenticated By: Harmon Pier, M.D.      1. Chest pain       MDM  With extensive cardiac history complaining of chest pain concerning for ACS however it is a typical to prior MIs.  EKG is nonischemic (see Dr. Reynolds Bowl note) first troponin is negative.  As a shared visit with attending Dr. Preston Fleeting who has started a heparin and nitroglycerin drip.  [facility] will come to evaluate.       Wynetta Emery, PA-C 06/03/12 1652

## 2012-06-03 NOTE — Telephone Encounter (Signed)
Spoke with pt. Pt states he has been having chest pain between his shoulder blade and upper chest since yesterday. He states it goes into his left shoulder and arm. He had similar symptoms prior to stent placement. Pt advised to report to ED now, not to drive, pt verbalized understanding.

## 2012-06-04 DIAGNOSIS — R079 Chest pain, unspecified: Secondary | ICD-10-CM

## 2012-06-04 LAB — TROPONIN I
Troponin I: <0.3 ng/mL (ref <0.30)
Troponin I: <0.3 ng/mL (ref <0.30)

## 2012-06-04 LAB — LIPID PANEL
Cholesterol: 252 mg/dL — ABNORMAL HIGH (ref 0–200)
HDL: 34 mg/dL — ABNORMAL LOW (ref >39)
LDL Cholesterol: UNDETERMINED mg/dL (ref 0–99)
Total CHOL/HDL Ratio: 7.4 RATIO
Triglycerides: 436 mg/dL — ABNORMAL HIGH (ref <150)
VLDL: UNDETERMINED mg/dL (ref 0–40)

## 2012-06-04 LAB — COMPREHENSIVE METABOLIC PANEL
ALT: 30 U/L (ref 0–53)
AST: 32 U/L (ref 0–37)
Albumin: 3.2 g/dL — ABNORMAL LOW (ref 3.5–5.2)
Alkaline Phosphatase: 47 U/L (ref 39–117)
BUN: 14 mg/dL (ref 6–23)
CO2: 25 mEq/L (ref 19–32)
Calcium: 9.3 mg/dL (ref 8.4–10.5)
Chloride: 102 mEq/L (ref 96–112)
Creatinine, Ser: 1.23 mg/dL (ref 0.50–1.35)
GFR calc Af Amer: 67 mL/min — ABNORMAL LOW (ref >90)
GFR calc non Af Amer: 58 mL/min — ABNORMAL LOW (ref >90)
Glucose, Bld: 108 mg/dL — ABNORMAL HIGH (ref 70–99)
Potassium: 4 mEq/L (ref 3.5–5.1)
Sodium: 138 mEq/L (ref 135–145)
Total Bilirubin: 0.3 mg/dL (ref 0.3–1.2)
Total Protein: 7 g/dL (ref 6.0–8.3)

## 2012-06-04 NOTE — Progress Notes (Signed)
Patient Name: Chris [name]      SUBJECTIVE: Patient was complicated heart disease remote CABG multiple interventions admitted with chest pain; cardiac enzymes are normal  No significant complaints overnight. Eager to go home  Past Medical History  Diagnosis Date  . Coronary artery disease     a. 1995 s/p CABG x 3 (VG->RCA, LIMA->LAD, VG->OM);  b. 07/2008 Inf MI/Cath/PCI: VG->RCA 99 - treated with Taxus DES (38mm), LIMA and VG->OM patent, LAD 100, LCX 100, RCA 60d, EF 50%;  c. 09/2008 PCI native RCA  w/ 2.5x23 Xience DES, VG->RCA stent patent;  c. 05/2010 Cath: Native 3VD with 3/3 patent grafts, native RCA w 80% ISR prox to graft insertion-->Med Rx.  . RBBB (right bundle branch block)   . Gout   . Hyperlipidemia   . Obesity   . Hypertension   . ED (erectile dysfunction)   . Vertigo   . Umbilical hernia     a. s/p repair.    PHYSICAL EXAM Filed Vitals:   06/03/12 1830 06/03/12 2000 06/03/12 2100 06/04/12 0500  BP: 144/73 145/91  138/81  Pulse: 56 46  63  Temp:  97.9 F (36.6 C)  98.2 F (36.8 C)  TempSrc:      Resp: 17 20  18   Height:  5\' 11"  (1.803 m) 5\' 11"  (1.803 m)   Weight:  239 lb 8 oz (108.636 kg) 241 lb 10 oz (109.6 kg)   SpO2: 96% 97%  96%     Well developed and nourished in no acute distress HENT normal Neck supple with JVP-flat Clear Regular rate and rhythm, no murmurs or gallops Abd-soft with active BS No Clubbing cyanosis edema Skin-warm and dry A & Oriented  Grossly normal sensory and motor function   Intake/Output Summary (Last 24 hours) at 06/04/12 1034 Last data filed at 06/04/12 0100  Gross per 24 hour  Intake    120 ml  Output    250 ml  Net   -130 ml    LABS: Basic Metabolic Panel:  Lab 06/04/12 1610 06/03/12 1415  NA 138 139  K 4.0 4.3  CL 102 102  CO2 25 25  GLUCOSE 108* 93  BUN 14 14  CREATININE 1.23 1.23  CALCIUM 9.3 9.7  MG -- --  PHOS -- --   Cardiac Enzymes:  Basename 06/04/12 0228 06/03/12 2016  CKTOTAL -- --    CKMB -- --  CKMBINDEX -- --  TROPONINI <0.30 <0.30   CBC:  Lab 06/03/12 1415  WBC 6.1  NEUTROABS 3.3  HGB 15.2  HCT 44.4  MCV 92.7  PLT 187   PROTIME:  Basename 06/03/12 1542  LABPROT 12.9  INR 0.98   Liver Function Tests:  Basename 06/04/12 0229 06/03/12 1415  AST 32 34  ALT 30 31  ALKPHOS 47 52  BILITOT 0.3 0.3  PROT 7.0 7.7  ALBUMIN 3.2* 3.6   No results found for this basename: LIPASE:2,AMYLASE:2 in the last 72 hours BNP: BNP (last 3 results) No results found for this basename: PROBNP:3 in the last 8760 hours D-Dimer: No results found for this basename: DDIMER:2 in the last 72 hours Hemoglobin A1C: No results found for this basename: HGBA1C in the last 72 hours Fasting Lipid Panel:  Basename 06/04/12 0229  CHOL 252*  HDL 34*  LDLCALC UNABLE TO CALCULATE IF TRIGLYCERIDE OVER 400 mg/dL  TRIG 960*  CHOLHDL 7.4  LDLDIRECT --    ASSESSMENT AND PLAN:  Patient Active Hospital Problem List: Precordial  pain (06/03/2012) Hypertrigyceridemia   Cardiac enzymes were normal. It is felt by Dr. Davonna Belling to be noncardiac. Will discharge to home today.,  Further therapy for his hypertriglyceridemia will be deferred to Dr. DM. Will be important measure direct LDL. Both important therapeutic agent is his statin  He was to see Dr. Gust Rung in March. We should arrange followup in the 4-6 week period. There is no scheduled appointment.   Signed, Sherryl Manges MD  06/04/2012

## 2012-06-04 NOTE — Discharge Summary (Signed)
Discharge Summary   Patient ID: Chris [name] MRN: [id number], DOB/AGE: 08/21/1940 72 y.o.  Primary MD: Leo Grosser, MD Primary Cardiologist: Dr. Shirlee Latch Admit date: 06/03/2012 D/C date:     06/04/2012      Primary Discharge Diagnoses:  1. Precordial pain  - No objective evidence of acute cardiac ischemia  - ?GI etiology, F/u with GI  2. Hypertriglyceridemia  - TG 436, cont statin  - F/u w/ Dr. Shirlee Latch    Secondary Discharge Diagnoses:  . Coronary artery disease     a. 1995 s/p CABG x 3 (VG->RCA, LIMA->LAD, VG->OM);  b. 07/2008 Inf MI/Cath/PCI: VG->RCA 99 - treated with Taxus DES (38mm), LIMA and VG->OM patent, LAD 100, LCX 100, RCA 60d, EF 50%;  c. 09/2008 PCI native RCA  w/ 2.5x23 Xience DES, VG->RCA stent patent;  c. 05/2010 Cath: Native 3VD with 3/3 patent grafts, native RCA w 80% ISR prox to graft insertion-->Med Rx.  . RBBB (right bundle branch block)   . Gout   . Hyperlipidemia   . Obesity   . Hypertension   . ED (erectile dysfunction)   . Vertigo   . Umbilical hernia     a. s/p repair.     Allergies No Known Allergies  Diagnostic Studies/Procedures:  None  History of Present Illness: 72 y.o. male w/ the above medical problems who presented to [facility] on 06/03/12 with complaints of chest pain.  Hospital Course: EKG revealed NSR with no acute ST/T changes. CXR was without acute cardiopulmonary abnormalities. Labs were significant for normal troponin and unremarkable CBC/CMET. He was placed in observation for further evaluation and treatment. Cardiac enzymes were cycled and remained negative. He had no further chest pain. It was felt his pain was noncardiac in nature and possibly GI in etiology. It is recommended he follow up with his PCP and consider GI work up. Lipid panel showed Triglycerides to be significantly elevated at 436. It is recommended he follow up with Dr. Shirlee Latch and consider measuring direct LDL. He was seen and evaluated by Dr. Graciela Husbands  who felt he was stable for discharge home with plans for follow up as scheduled below.  Discharge Vitals: Blood pressure 138/81, pulse 63, temperature 98.2 F (36.8 C), temperature source Oral, resp. rate 18, height 5\' 11"  (1.803 m), weight 241 lb 10 oz (109.6 kg), SpO2 96.00%.  Labs: Component Value Date   WBC 6.1 06/03/2012   HGB 15.2 06/03/2012   HCT 44.4 06/03/2012   MCV 92.7 06/03/2012   PLT 187 06/03/2012    Lab 06/04/12 0229  NA 138  K 4.0  CL 102  CO2 25  BUN 14  CREATININE 1.23  CALCIUM 9.3  PROT 7.0  BILITOT 0.3  ALKPHOS 47  ALT 30  AST 32  GLUCOSE 108*   Basename 06/04/12 0919 06/04/12 0228 06/03/12 2016  TROPONINI <0.30 <0.30 <0.30   Component Value Date   CHOL 252* 06/04/2012   HDL 34* 06/04/2012   LDLCALC UNABLE TO CALCULATE IF TRIGLYCERIDE OVER 400 mg/dL 08/31/8117   TRIG 147* 01/21/5620     Discharge Medications     Medication List     As of 06/04/2012 12:53 PM    TAKE these medications         ALLOPURINOL PO   Take 1 tablet by mouth daily.      aspirin EC 81 MG tablet   Take 1 tablet (81 mg total) by mouth daily.      COLCRYS 0.6 MG  tablet   Generic drug: colchicine      losartan 25 MG tablet   Commonly known as: COZAAR   Take 1 tablet (25 mg total) by mouth daily.      nitroGLYCERIN 0.4 MG SL tablet   Commonly known as: NITROSTAT   Place 0.4 mg under the tongue every 5 (five) minutes as needed. For chest pain      pantoprazole 40 MG tablet   Commonly known as: PROTONIX   Take 40 mg by mouth daily.      rosuvastatin 10 MG tablet   Commonly known as: CRESTOR   Take 10 mg by mouth daily.         Disposition   Discharge Orders    Future Orders Please Complete By Expires   Diet - low sodium heart healthy      Increase activity slowly      Discharge instructions      Comments:   * Please take all medications as prescribed and bring them with you to your office visit  * There were no findings to suggest your chest pain is coming  from your heart. Please follow up with your primary care provider for consideration for a GI work up. It is possible your pain could be coming from your hiatal hernia.     Follow-up Information    Schedule an appointment as soon as possible for a visit with Leo Grosser, MD.   Contact information:   [address] [place] [address] [phone]       Follow up with Marca Ancona, MD. (Our office will call you with your appointment time)    Contact information:   [facility] [address] [place] [address] [phone]           Outstanding Labs/Studies:  None  Duration of Discharge Encounter: Greater than 30 minutes including physician and PA time.  Signed, Trig Mcbryar PA-C 06/04/2012, 12:53 PM

## 2012-06-07 ENCOUNTER — Telehealth: Payer: Self-pay | Admitting: Cardiology

## 2012-06-07 NOTE — Telephone Encounter (Signed)
New Problem: ° ° ° °I called the patient and was unable to reach them. I left a message on their voicemail with my name, the reason I called, the name of their physician, and a number to call back to schedule their appointment. ° °

## 2012-07-21 ENCOUNTER — Telehealth: Payer: Self-pay | Admitting: Cardiology

## 2012-09-29 ENCOUNTER — Other Ambulatory Visit: Payer: Self-pay | Admitting: Cardiology

## 2012-10-03 ENCOUNTER — Telehealth: Payer: Self-pay | Admitting: Family Medicine

## 2012-10-03 MED ORDER — PANTOPRAZOLE SODIUM 40 MG PO TBEC: 40.0000 mg | DELAYED_RELEASE_TABLET | Freq: Every day | ORAL | Status: DC

## 2012-10-03 NOTE — Telephone Encounter (Signed)
RX refilled  

## 2012-10-10 ENCOUNTER — Telehealth: Payer: Self-pay | Admitting: Family Medicine

## 2012-10-11 NOTE — Telephone Encounter (Signed)
Med was rf with 2 rf on 09/30/12

## 2012-10-13 ENCOUNTER — Telehealth: Payer: Self-pay | Admitting: Family Medicine

## 2012-10-13 MED ORDER — ALLOPURINOL 100 MG PO TABS: 100.0000 mg | ORAL_TABLET | Freq: Every day | ORAL | Status: DC

## 2012-10-13 NOTE — Telephone Encounter (Signed)
Rx Refilled  

## 2012-10-14 ENCOUNTER — Encounter: Payer: Self-pay | Admitting: Family Medicine

## 2012-10-14 ENCOUNTER — Ambulatory Visit (INDEPENDENT_AMBULATORY_CARE_PROVIDER_SITE_OTHER): Payer: Medicare Other | Admitting: Family Medicine

## 2012-10-14 VITALS — BP 110/72 | HR 72 | Temp 98.2°F | Resp 18 | Wt 246.0 lb

## 2012-10-14 DIAGNOSIS — M109 Gout, unspecified: Secondary | ICD-10-CM

## 2012-10-14 DIAGNOSIS — Z125 Encounter for screening for malignant neoplasm of prostate: Secondary | ICD-10-CM

## 2012-10-14 DIAGNOSIS — I25810 Atherosclerosis of coronary artery bypass graft(s) without angina pectoris: Secondary | ICD-10-CM

## 2012-10-14 LAB — COMPLETE METABOLIC PANEL WITH GFR
ALT: 31 U/L (ref 0–53)
AST: 32 U/L (ref 0–37)
Albumin: 4 g/dL (ref 3.5–5.2)
Alkaline Phosphatase: 45 U/L (ref 39–117)
BUN: 13 mg/dL (ref 6–23)
CO2: 25 mEq/L (ref 19–32)
Calcium: 9.5 mg/dL (ref 8.4–10.5)
Chloride: 104 mEq/L (ref 96–112)
Creat: 1.38 mg/dL — ABNORMAL HIGH (ref 0.50–1.35)
GFR, Est African American: 60 mL/min
GFR, Est Non African American: 52 mL/min — ABNORMAL LOW
Glucose, Bld: 111 mg/dL — ABNORMAL HIGH (ref 70–99)
Potassium: 4.5 mEq/L (ref 3.5–5.3)
Sodium: 138 mEq/L (ref 135–145)
Total Bilirubin: 0.6 mg/dL (ref 0.3–1.2)
Total Protein: 7.2 g/dL (ref 6.0–8.3)

## 2012-10-14 LAB — LIPID PANEL
Cholesterol: 189 mg/dL (ref 0–200)
HDL: 36 mg/dL — ABNORMAL LOW (ref >39)
LDL Cholesterol: 98 mg/dL (ref 0–99)
Total CHOL/HDL Ratio: 5.3 Ratio
Triglycerides: 277 mg/dL — ABNORMAL HIGH (ref <150)
VLDL: 55 mg/dL — ABNORMAL HIGH (ref 0–40)

## 2012-10-14 LAB — PSA, MEDICARE: PSA: 1.57 ng/mL (ref <=4.00)

## 2012-10-14 LAB — URIC ACID: Uric Acid, Serum: 7.7 mg/dL (ref 4.0–7.8)

## 2012-10-14 NOTE — Progress Notes (Signed)
Subjective:    Patient ID: Chris [name], male    DOB: Jan 09, 1941, 72 y.o.   MRN: [id number]  HPI Patient is here today for prostate exam. He reports 2 episodes of nocturia. He reports having to void every 4-5 hours. He reports a weak stream. He denies any dysuria or hematuria. He is has a history of gout. He is currently taking allopurinol 100 mg by mouth daily. He is overdue for your Darcey Nora. He's not had any gout flares every 6 months. He also has a history of coronary artery disease status post CABG. He is on Crestor 10 mg by mouth daily for hyperlipidemia. He is overdue for a fasting lipid panel.  He denies any chest pain shortness of breath or dyspnea on exertion. Blood pressure is currently well-controlled. Past Medical History  Diagnosis Date  . Coronary artery disease     a. 1995 s/p CABG x 3 (VG->RCA, LIMA->LAD, VG->OM);  b. 07/2008 Inf MI/Cath/PCI: VG->RCA 99 - treated with Taxus DES (38mm), LIMA and VG->OM patent, LAD 100, LCX 100, RCA 60d, EF 50%;  c. 09/2008 PCI native RCA  w/ 2.5x23 Xience DES, VG->RCA stent patent;  c. 05/2010 Cath: Native 3VD with 3/3 patent grafts, native RCA w 80% ISR prox to graft insertion-->Med Rx.  . RBBB (right bundle branch block)   . Gout   . Hyperlipidemia   . Obesity   . Hypertension   . ED (erectile dysfunction)   . Vertigo   . Umbilical hernia     a. s/p repair.   Current Outpatient Prescriptions on File Prior to Visit  Medication Sig Dispense Refill  . allopurinol (ZYLOPRIM) 100 MG tablet Take 1 tablet (100 mg total) by mouth daily.  30 tablet  3  . COLCRYS 0.6 MG tablet       . losartan (COZAAR) 25 MG tablet TAKE 1 TABLET BY MOUTH EVERY DAY  90 tablet  0  . nitroGLYCERIN (NITROSTAT) 0.4 MG SL tablet Place 0.4 mg under the tongue every 5 (five) minutes as needed. For chest pain      . pantoprazole (PROTONIX) 40 MG tablet Take 1 tablet (40 mg total) by mouth daily.  30 tablet  2  . rosuvastatin (CRESTOR) 10 MG tablet Take 10 mg by mouth daily.       Marland Kitchen aspirin EC 81 MG tablet Take 1 tablet (81 mg total) by mouth daily.       No current facility-administered medications on file prior to visit.   No Known Allergies History   Social History  . Marital Status: Married    Spouse Name: N/A    Number of Children: N/A  . Years of Education: N/A   Occupational History  . Not on file.   Social History Main Topics  . Smoking status: Former Smoker -- 1.00 packs/day for 9 years    Types: Cigarettes    Quit date: 10/31/1970  . Smokeless tobacco: Former Neurosurgeon    Types: Chew     Comment: chewed for 2 years  . Alcohol Use: No  . Drug Use: No  . Sexually Active: Yes   Other Topics Concern  . Not on file   Social History Narrative   Lives in [place] with wife.  Retired.      Review of Systems  All other systems reviewed and are negative.       Objective:   Physical Exam  Vitals reviewed. Constitutional: He appears well-developed and well-nourished.  Cardiovascular: Normal rate, regular rhythm,  normal heart sounds and intact distal pulses.   No murmur heard. Pulmonary/Chest: Effort normal and breath sounds normal. No respiratory distress. He has no wheezes. He has no rales. He exhibits no tenderness.  Abdominal: Soft. Bowel sounds are normal. He exhibits no distension. There is no tenderness. There is no rebound and no guarding.   patient refuses rectal exam.        Assessment & Plan:  1. Gout Clinically well controlled. Check uric acid level. Goal uric acid level is less than 6. - Uric acid  2. Prostate cancer screening I recommended a digital rectal exam which the patient refuses.  He is willing to check a PSA. - PSA, Medicare  3. CAD (coronary artery disease) of artery bypass graft Patient is asymptomatic. He's currently well controlled with regard to his blood pressure. He is taking an aspirin daily. Will check a fasting lipid panel. Goal LDL is less than 70. - COMPLETE METABOLIC PANEL WITH GFR - Lipid  panel

## 2012-10-19 ENCOUNTER — Other Ambulatory Visit: Payer: Self-pay | Admitting: Family Medicine

## 2012-10-19 MED ORDER — ROSUVASTATIN CALCIUM 20 MG PO TABS: 20.0000 mg | ORAL_TABLET | Freq: Every day | ORAL | Status: DC

## 2012-10-19 NOTE — Telephone Encounter (Signed)
Rx Refilled  

## 2012-10-22 ENCOUNTER — Other Ambulatory Visit: Payer: Self-pay | Admitting: Cardiology

## 2012-11-16 ENCOUNTER — Telehealth: Payer: Self-pay | Admitting: Family Medicine

## 2012-11-17 ENCOUNTER — Other Ambulatory Visit: Payer: Self-pay | Admitting: Family Medicine

## 2012-11-17 DIAGNOSIS — M25572 Pain in left ankle and joints of left foot: Secondary | ICD-10-CM

## 2012-11-17 NOTE — Telephone Encounter (Signed)
Patient aware.

## 2012-11-17 NOTE — Telephone Encounter (Signed)
Dr Ganji 

## 2012-12-02 ENCOUNTER — Telehealth: Payer: Self-pay | Admitting: Family Medicine

## 2012-12-05 NOTE — Telephone Encounter (Signed)
My advice is that he call [facility] and ask them to transfer to another provider in their practice rather than transfer out of the group.  They are the best cardiologists in town and know his history.  It would take a month to get him into another group.

## 2012-12-06 ENCOUNTER — Ambulatory Visit: Payer: Medicare Other | Admitting: Physician Assistant

## 2012-12-06 NOTE — Telephone Encounter (Signed)
Pt aware.

## 2012-12-20 ENCOUNTER — Telehealth: Payer: Self-pay | Admitting: Family Medicine

## 2012-12-21 ENCOUNTER — Other Ambulatory Visit: Payer: Self-pay | Admitting: Family Medicine

## 2012-12-21 DIAGNOSIS — M25572 Pain in left ankle and joints of left foot: Secondary | ICD-10-CM

## 2012-12-21 NOTE — Telephone Encounter (Signed)
Spoke to pt, he does NOT want to see anyone in [place] at all. Will refer him to Dr. Jacinto Halim.

## 2012-12-27 ENCOUNTER — Other Ambulatory Visit: Payer: Self-pay | Admitting: Cardiology

## 2013-01-05 ENCOUNTER — Other Ambulatory Visit: Payer: Self-pay | Admitting: *Deleted

## 2013-02-07 ENCOUNTER — Encounter: Payer: Self-pay | Admitting: Family Medicine

## 2013-02-14 ENCOUNTER — Encounter: Payer: Self-pay | Admitting: Family Medicine

## 2013-02-14 DIAGNOSIS — I6521 Occlusion and stenosis of right carotid artery: Secondary | ICD-10-CM | POA: Insufficient documentation

## 2013-02-17 ENCOUNTER — Other Ambulatory Visit: Payer: Self-pay | Admitting: Family Medicine

## 2013-03-07 ENCOUNTER — Telehealth: Payer: Self-pay | Admitting: Family Medicine

## 2013-03-07 NOTE — Telephone Encounter (Signed)
Do you have the paperwork from Dr. Jacinto Halim ,if so why doesn't the surgeon have the results. Please contact me when it is been sent.  Protonix needs to be called in.

## 2013-03-08 MED ORDER — PANTOPRAZOLE SODIUM 40 MG PO TBEC: 40.0000 mg | DELAYED_RELEASE_TABLET | Freq: Every day | ORAL | Status: DC

## 2013-03-08 NOTE — Telephone Encounter (Signed)
Tried to call pt but no answer and no vm - note from [facility] is in Epic but not sure if he sent to his surgeon or not.

## 2013-03-08 NOTE — Telephone Encounter (Signed)
Rx Refilled  

## 2013-03-15 NOTE — Telephone Encounter (Signed)
LMTRC

## 2013-03-19 ENCOUNTER — Other Ambulatory Visit: Payer: Self-pay | Admitting: Family Medicine

## 2013-03-27 ENCOUNTER — Other Ambulatory Visit: Payer: Self-pay | Admitting: Cardiology

## 2013-04-11 ENCOUNTER — Encounter (HOSPITAL_BASED_OUTPATIENT_CLINIC_OR_DEPARTMENT_OTHER): Payer: Self-pay | Admitting: *Deleted

## 2013-04-11 NOTE — Progress Notes (Signed)
Had recent stress test and echo dr Nadara Eaton for clearance for surgery-to come in for bmet-

## 2013-04-12 ENCOUNTER — Encounter (HOSPITAL_BASED_OUTPATIENT_CLINIC_OR_DEPARTMENT_OTHER)
Admission: RE | Admit: 2013-04-12 | Discharge: 2013-04-12 | Disposition: A | Discharge status: Home / Self Care | Payer: Medicare Other | Source: Ambulatory Visit | Attending: Orthopedic Surgery | Admitting: Orthopedic Surgery

## 2013-04-12 ENCOUNTER — Other Ambulatory Visit: Payer: Self-pay | Admitting: Orthopedic Surgery

## 2013-04-12 LAB — BASIC METABOLIC PANEL
BUN: 17 mg/dL (ref 6–23)
CO2: 25 mEq/L (ref 19–32)
Calcium: 10.1 mg/dL (ref 8.4–10.5)
Chloride: 104 mEq/L (ref 96–112)
Creatinine, Ser: 1.2 mg/dL (ref 0.50–1.35)
GFR calc Af Amer: 69 mL/min — ABNORMAL LOW (ref >90)
GFR calc non Af Amer: 60 mL/min — ABNORMAL LOW (ref >90)
Glucose, Bld: 108 mg/dL — ABNORMAL HIGH (ref 70–99)
Potassium: 4.2 mEq/L (ref 3.5–5.1)
Sodium: 141 mEq/L (ref 135–145)

## 2013-04-13 ENCOUNTER — Encounter (HOSPITAL_BASED_OUTPATIENT_CLINIC_OR_DEPARTMENT_OTHER)
Admission: RE | Disposition: A | Discharge status: Home / Self Care | Payer: Self-pay | Source: Ambulatory Visit | Attending: Orthopedic Surgery

## 2013-04-13 ENCOUNTER — Ambulatory Visit (HOSPITAL_BASED_OUTPATIENT_CLINIC_OR_DEPARTMENT_OTHER)
Admission: RE | Admit: 2013-04-13 | Discharge: 2013-04-13 | Disposition: A | Discharge status: Home / Self Care | Payer: Medicare Other | Source: Ambulatory Visit | Attending: Orthopedic Surgery | Admitting: Orthopedic Surgery

## 2013-04-13 ENCOUNTER — Encounter (HOSPITAL_BASED_OUTPATIENT_CLINIC_OR_DEPARTMENT_OTHER): Payer: Medicare Other | Admitting: Certified Registered Nurse Anesthetist

## 2013-04-13 ENCOUNTER — Encounter (HOSPITAL_BASED_OUTPATIENT_CLINIC_OR_DEPARTMENT_OTHER): Payer: Self-pay | Admitting: *Deleted

## 2013-04-13 ENCOUNTER — Ambulatory Visit (HOSPITAL_BASED_OUTPATIENT_CLINIC_OR_DEPARTMENT_OTHER): Payer: Medicare Other | Admitting: Certified Registered Nurse Anesthetist

## 2013-04-13 DIAGNOSIS — E785 Hyperlipidemia, unspecified: Secondary | ICD-10-CM | POA: Insufficient documentation

## 2013-04-13 DIAGNOSIS — Z87891 Personal history of nicotine dependence: Secondary | ICD-10-CM | POA: Insufficient documentation

## 2013-04-13 DIAGNOSIS — I2510 Atherosclerotic heart disease of native coronary artery without angina pectoris: Secondary | ICD-10-CM | POA: Insufficient documentation

## 2013-04-13 DIAGNOSIS — Z01812 Encounter for preprocedural laboratory examination: Secondary | ICD-10-CM | POA: Insufficient documentation

## 2013-04-13 DIAGNOSIS — M959 Acquired deformity of musculoskeletal system, unspecified: Secondary | ICD-10-CM | POA: Insufficient documentation

## 2013-04-13 DIAGNOSIS — D1630 Benign neoplasm of short bones of unspecified lower limb: Secondary | ICD-10-CM | POA: Insufficient documentation

## 2013-04-13 DIAGNOSIS — E669 Obesity, unspecified: Secondary | ICD-10-CM | POA: Insufficient documentation

## 2013-04-13 DIAGNOSIS — Z7982 Long term (current) use of aspirin: Secondary | ICD-10-CM | POA: Insufficient documentation

## 2013-04-13 DIAGNOSIS — Z951 Presence of aortocoronary bypass graft: Secondary | ICD-10-CM | POA: Insufficient documentation

## 2013-04-13 DIAGNOSIS — M24176 Other articular cartilage disorders, unspecified foot: Secondary | ICD-10-CM | POA: Insufficient documentation

## 2013-04-13 DIAGNOSIS — M899 Disorder of bone, unspecified: Secondary | ICD-10-CM

## 2013-04-13 DIAGNOSIS — M249 Joint derangement, unspecified: Secondary | ICD-10-CM | POA: Insufficient documentation

## 2013-04-13 DIAGNOSIS — Z9861 Coronary angioplasty status: Secondary | ICD-10-CM | POA: Insufficient documentation

## 2013-04-13 DIAGNOSIS — M109 Gout, unspecified: Secondary | ICD-10-CM | POA: Insufficient documentation

## 2013-04-13 DIAGNOSIS — I10 Essential (primary) hypertension: Secondary | ICD-10-CM | POA: Insufficient documentation

## 2013-04-13 HISTORY — PX: ANKLE ARTHROSCOPY WITH DRILLING/MICROFRACTURE: SHX5580

## 2013-04-13 LAB — POCT HEMOGLOBIN-HEMACUE: Hemoglobin: 16.2 g/dL (ref 13.0–17.0)

## 2013-04-13 SURGERY — ARTHROSCOPY, ANKLE, WITH MICROFRACTURE
Anesthesia: Regional | Site: Ankle | Laterality: Left | Wound class: Clean

## 2013-04-13 MED ORDER — OXYCODONE HCL 5 MG/5ML PO SOLN: 5.0000 mg | Freq: Once | ORAL | Status: DC | PRN

## 2013-04-13 MED ORDER — HYDROMORPHONE HCL PF 1 MG/ML IJ SOLN: 0.2500 mg | INTRAMUSCULAR | Status: DC | PRN

## 2013-04-13 MED ORDER — NORCO 5-325 MG PO TABS: 1.0000 | ORAL_TABLET | ORAL | Status: DC | PRN

## 2013-04-13 MED ORDER — BUPIVACAINE HCL (PF) 0.5 % IJ SOLN
INTRAMUSCULAR | Status: AC
  Filled 2013-04-13: qty 30

## 2013-04-13 MED ORDER — PROPOFOL 10 MG/ML IV EMUL
INTRAVENOUS | Status: AC
  Filled 2013-04-13: qty 50

## 2013-04-13 MED ORDER — BUPIVACAINE-EPINEPHRINE PF 0.5-1:200000 % IJ SOLN
INTRAMUSCULAR | Status: DC | PRN
  Administered 2013-04-13: 30 mL via PERINEURAL

## 2013-04-13 MED ORDER — FENTANYL CITRATE 0.05 MG/ML IJ SOLN
50.0000 ug | INTRAMUSCULAR | Status: DC | PRN
  Administered 2013-04-13: 100 ug via INTRAVENOUS

## 2013-04-13 MED ORDER — LACTATED RINGERS IV SOLN
INTRAVENOUS | Status: DC
  Administered 2013-04-13: 07:00:00 via INTRAVENOUS

## 2013-04-13 MED ORDER — ACETAMINOPHEN 500 MG PO TABS
ORAL_TABLET | ORAL | Status: AC
  Filled 2013-04-13: qty 2

## 2013-04-13 MED ORDER — CHLORHEXIDINE GLUCONATE 4 % EX LIQD: 60.0000 mL | Freq: Once | CUTANEOUS | Status: DC

## 2013-04-13 MED ORDER — SODIUM CHLORIDE 0.9 % IR SOLN
Status: DC | PRN
  Administered 2013-04-13: 4000 mL

## 2013-04-13 MED ORDER — PHENYLEPHRINE HCL 10 MG/ML IJ SOLN
10.0000 mg | INTRAVENOUS | Status: DC | PRN
  Administered 2013-04-13: 40 ug/min via INTRAVENOUS

## 2013-04-13 MED ORDER — BACITRACIN ZINC 500 UNIT/GM EX OINT
TOPICAL_OINTMENT | CUTANEOUS | Status: DC | PRN
  Administered 2013-04-13: 1 via TOPICAL

## 2013-04-13 MED ORDER — DEXAMETHASONE SODIUM PHOSPHATE 10 MG/ML IJ SOLN
INTRAMUSCULAR | Status: DC | PRN
  Administered 2013-04-13: 4 mg via INTRAVENOUS

## 2013-04-13 MED ORDER — FENTANYL CITRATE 0.05 MG/ML IJ SOLN
INTRAMUSCULAR | Status: DC | PRN
  Administered 2013-04-13: 50 ug via INTRAVENOUS

## 2013-04-13 MED ORDER — FENTANYL CITRATE 0.05 MG/ML IJ SOLN
INTRAMUSCULAR | Status: AC
  Filled 2013-04-13: qty 2

## 2013-04-13 MED ORDER — LIDOCAINE-EPINEPHRINE (PF) 1.5 %-1:200000 IJ SOLN
INTRAMUSCULAR | Status: DC | PRN
  Administered 2013-04-13: 30 mL

## 2013-04-13 MED ORDER — MEPERIDINE HCL 25 MG/ML IJ SOLN: 6.2500 mg | INTRAMUSCULAR | Status: DC | PRN

## 2013-04-13 MED ORDER — CEFAZOLIN SODIUM-DEXTROSE 2-3 GM-% IV SOLR
INTRAVENOUS | Status: AC
  Filled 2013-04-13: qty 50

## 2013-04-13 MED ORDER — SODIUM CHLORIDE 0.9 % IV SOLN: INTRAVENOUS | Status: DC

## 2013-04-13 MED ORDER — ONDANSETRON HCL 4 MG/2ML IJ SOLN
INTRAMUSCULAR | Status: DC | PRN
  Administered 2013-04-13: 4 mg via INTRAVENOUS

## 2013-04-13 MED ORDER — BUPIVACAINE-EPINEPHRINE PF 0.5-1:200000 % IJ SOLN
INTRAMUSCULAR | Status: AC
  Filled 2013-04-13: qty 30

## 2013-04-13 MED ORDER — CEFAZOLIN SODIUM-DEXTROSE 2-3 GM-% IV SOLR
2.0000 g | INTRAVENOUS | Status: AC
  Administered 2013-04-13: 2 g via INTRAVENOUS

## 2013-04-13 MED ORDER — ACETAMINOPHEN 500 MG PO TABS
1000.0000 mg | ORAL_TABLET | Freq: Once | ORAL | Status: AC
  Administered 2013-04-13: 1000 mg via ORAL

## 2013-04-13 MED ORDER — BACITRACIN ZINC 500 UNIT/GM EX OINT
TOPICAL_OINTMENT | CUTANEOUS | Status: AC
  Filled 2013-04-13: qty 28.35

## 2013-04-13 MED ORDER — OXYCODONE HCL 5 MG PO TABS: 5.0000 mg | ORAL_TABLET | Freq: Once | ORAL | Status: DC | PRN

## 2013-04-13 MED ORDER — MIDAZOLAM HCL 2 MG/2ML IJ SOLN
INTRAMUSCULAR | Status: AC
  Filled 2013-04-13: qty 2

## 2013-04-13 MED ORDER — MIDAZOLAM HCL 2 MG/2ML IJ SOLN
1.0000 mg | INTRAMUSCULAR | Status: DC | PRN
  Administered 2013-04-13: 2 mg via INTRAVENOUS

## 2013-04-13 MED ORDER — FENTANYL CITRATE 0.05 MG/ML IJ SOLN
INTRAMUSCULAR | Status: AC
  Filled 2013-04-13: qty 6

## 2013-04-13 MED ORDER — LIDOCAINE HCL (CARDIAC) 20 MG/ML IV SOLN
INTRAVENOUS | Status: DC | PRN
  Administered 2013-04-13: 60 mg via INTRAVENOUS

## 2013-04-13 MED ORDER — PROPOFOL 10 MG/ML IV BOLUS
INTRAVENOUS | Status: DC | PRN
  Administered 2013-04-13: 150 mg via INTRAVENOUS

## 2013-04-13 MED ORDER — ONDANSETRON HCL 4 MG/2ML IJ SOLN: 4.0000 mg | Freq: Once | INTRAMUSCULAR | Status: DC | PRN

## 2013-04-13 SURGICAL SUPPLY — 80 items
BANDAGE ESMARK 6X9 LF (GAUZE/BANDAGES/DRESSINGS) ×1 IMPLANT
BLADE CUDA 2.0 (BLADE) IMPLANT
BLADE CUDA GRT WHITE 3.5 (BLADE) ×2 IMPLANT
BLADE CUDA SHAVER 3.5 (BLADE) IMPLANT
BLADE CUTTER GATOR 3.5 (BLADE) IMPLANT
BLADE SURG 15 STRL LF DISP TIS (BLADE) ×1 IMPLANT
BLADE SURG 15 STRL SS (BLADE) ×1
BNDG COHESIVE 4X5 TAN STRL (GAUZE/BANDAGES/DRESSINGS) ×2 IMPLANT
BNDG COHESIVE 6X5 TAN STRL LF (GAUZE/BANDAGES/DRESSINGS) ×2 IMPLANT
BNDG ESMARK 6X9 LF (GAUZE/BANDAGES/DRESSINGS) ×2
BUR 3.5 LG SPHERICAL (BURR) IMPLANT
BUR CUDA 2.9 (BURR) ×2 IMPLANT
BUR FULL RADIUS 2.9 (BURR) IMPLANT
BUR GATOR 2.9 (BURR) IMPLANT
BUR OVAL 4.0 (BURR) ×2 IMPLANT
BUR SPHERICAL 2.9 (BURR) IMPLANT
BUR VERTEX HOODED 4.5 (BURR) IMPLANT
BURR 3.5 LG SPHERICAL (BURR)
CANISTER SUCT 3000ML (MISCELLANEOUS) IMPLANT
CANISTER SUCT LVC 12 LTR MEDI- (MISCELLANEOUS) ×2 IMPLANT
CHLORAPREP W/TINT 26ML (MISCELLANEOUS) ×2 IMPLANT
CUFF TOURNIQUET SINGLE 34IN LL (TOURNIQUET CUFF) ×2 IMPLANT
DRAPE EXTREMITY T 121X128X90 (DRAPE) ×2 IMPLANT
DRAPE OEC MINIVIEW 54X84 (DRAPES) IMPLANT
DRAPE U-SHAPE 47X51 STRL (DRAPES) ×2 IMPLANT
DRSG EMULSION OIL 3X3 NADH (GAUZE/BANDAGES/DRESSINGS) ×2 IMPLANT
DRSG PAD ABDOMINAL 8X10 ST (GAUZE/BANDAGES/DRESSINGS) ×2 IMPLANT
DURA STEPPER LG (CAST SUPPLIES) IMPLANT
DURA STEPPER MED (CAST SUPPLIES) IMPLANT
DURA STEPPER SML (CAST SUPPLIES) IMPLANT
ELECT REM PT RETURN 9FT ADLT (ELECTROSURGICAL)
ELECTRODE REM PT RTRN 9FT ADLT (ELECTROSURGICAL) IMPLANT
GLOVE BIO SURGEON STRL SZ8 (GLOVE) ×2 IMPLANT
GLOVE BIOGEL PI IND STRL 7.0 (GLOVE) ×1 IMPLANT
GLOVE BIOGEL PI IND STRL 7.5 (GLOVE) ×1 IMPLANT
GLOVE BIOGEL PI IND STRL 8 (GLOVE) ×1 IMPLANT
GLOVE BIOGEL PI INDICATOR 7.0 (GLOVE) ×1
GLOVE BIOGEL PI INDICATOR 7.5 (GLOVE) ×1
GLOVE BIOGEL PI INDICATOR 8 (GLOVE) ×1
GLOVE ECLIPSE 6.5 STRL STRAW (GLOVE) ×2 IMPLANT
GLOVE ECLIPSE 7.0 STRL STRAW (GLOVE) ×2 IMPLANT
GLOVE EXAM NITRILE EXT CUFF MD (GLOVE) ×2 IMPLANT
GLOVE EXAM NITRILE MD LF STRL (GLOVE) IMPLANT
GOWN PREVENTION PLUS XLARGE (GOWN DISPOSABLE) ×4 IMPLANT
GOWN PREVENTION PLUS XXLARGE (GOWN DISPOSABLE) ×2 IMPLANT
IV NS IRRIG 3000ML ARTHROMATIC (IV SOLUTION) ×4 IMPLANT
PACK ARTHROSCOPY DSU (CUSTOM PROCEDURE TRAY) IMPLANT
PACK BASIN DAY SURGERY FS (CUSTOM PROCEDURE TRAY) ×2 IMPLANT
PAD CAST 4YDX4 CTTN HI CHSV (CAST SUPPLIES) ×1 IMPLANT
PADDING CAST ABS 4INX4YD NS (CAST SUPPLIES)
PADDING CAST ABS COTTON 4X4 ST (CAST SUPPLIES) IMPLANT
PADDING CAST COTTON 4X4 STRL (CAST SUPPLIES) ×1
PADDING CAST COTTON 6X4 STRL (CAST SUPPLIES) ×2 IMPLANT
PENCIL BUTTON HOLSTER BLD 10FT (ELECTRODE) IMPLANT
SANITIZER HAND PURELL 535ML FO (MISCELLANEOUS) ×2 IMPLANT
SET IRRIG Y TYPE TUR BLADDER L (SET/KITS/TRAYS/PACK) ×2 IMPLANT
SLEEVE SCD COMPRESS KNEE MED (MISCELLANEOUS) ×2 IMPLANT
SPLINT FAST PLASTER 5X30 (CAST SUPPLIES)
SPLINT PLASTER CAST FAST 5X30 (CAST SUPPLIES) IMPLANT
SPONGE GAUZE 4X4 12PLY (GAUZE/BANDAGES/DRESSINGS) ×2 IMPLANT
SPONGE LAP 18X18 X RAY DECT (DISPOSABLE) ×2 IMPLANT
STOCKINETTE 6  STRL (DRAPES) ×1
STOCKINETTE 6 STRL (DRAPES) ×1 IMPLANT
STRAP ANKLE DISTRACTOR (MISCELLANEOUS) ×2 IMPLANT
STRAP ANKLE FOOT DISTRACTOR (ORTHOPEDIC SUPPLIES) IMPLANT
SUT ETHILON 3 0 PS 1 (SUTURE) ×2 IMPLANT
SUT MNCRL AB 3-0 PS2 18 (SUTURE) IMPLANT
SUT VIC AB 0 CT1 27 (SUTURE)
SUT VIC AB 0 CT1 27XBRD ANBCTR (SUTURE) IMPLANT
SUT VIC AB 2-0 SH 18 (SUTURE) IMPLANT
SUT VIC AB 2-0 SH 27 (SUTURE)
SUT VIC AB 2-0 SH 27XBRD (SUTURE) IMPLANT
SUT VIC AB 3-0 PS1 18 (SUTURE)
SUT VIC AB 3-0 PS1 18XBRD (SUTURE) IMPLANT
SYR BULB 3OZ (MISCELLANEOUS) IMPLANT
TOWEL OR 17X24 6PK STRL BLUE (TOWEL DISPOSABLE) ×4 IMPLANT
TOWEL OR NON WOVEN STRL DISP B (DISPOSABLE) ×2 IMPLANT
TUBE CONNECTING 20X1/4 (TUBING) ×2 IMPLANT
WAND STAR VAC 90 (SURGICAL WAND) IMPLANT
WATER STERILE IRR 1000ML POUR (IV SOLUTION) ×2 IMPLANT

## 2013-04-13 NOTE — Transfer of Care (Signed)
Immediate Anesthesia Transfer of Care Note  Patient: Chris [name]  Procedure(s) Performed: Procedure(s): LEFT ANKLE ARTHROSCOPY WITH EXTENSIVE DEBRIEDMENT (Left)  Patient Location: PACU  Anesthesia Type:GA combined with regional for post-op pain  Level of Consciousness: awake and patient cooperative  Airway & Oxygen Therapy: Patient Spontanous Breathing and Patient connected to face mask oxygen  Post-op Assessment: Report given to PACU RN and Post -op Vital signs reviewed and stable  Post vital signs: Reviewed and stable  Complications: No apparent anesthesia complications

## 2013-04-13 NOTE — Anesthesia Procedure Notes (Signed)
Anesthesia Regional Block:  Popliteal block  Pre-Anesthetic Checklist: ,, timeout performed, Correct Patient, Correct Site, Correct Laterality, Correct Procedure, Correct Position, site marked, Risks and benefits discussed,  Surgical consent,  Pre-op evaluation,  At surgeon's request and post-op pain management  Laterality: Left  Prep: chloraprep       Needles:  Injection technique: Single-shot  Needle Type: Echogenic Stimulator Needle     Needle Length: 10cm 10 cm Needle Gauge: 21 and 21 G    Additional Needles:  Procedures: ultrasound guided (picture in chart) and nerve stimulator Popliteal block  Nerve Stimulator or Paresthesia:  Response: 0.4 mA,   Additional Responses:   Narrative:  Start time: 04/13/2013 6:58 AM End time: 04/13/2013 7:10 AM Injection made incrementally with aspirations every 5 mL.  Performed by: Personally  Anesthesiologist: Arta Bruce MD  Additional Notes: Monitors applied. Patient sedated. Sterile prep and drape,hand hygiene and sterile gloves were used. Relevant anatomy identified.Needle position confirmed.Local anesthetic injected incrementally after negative aspiration. Local anesthetic spread visualized around nerve(s). Vascular puncture avoided. No complications. Image printed for medical record.The patient tolerated the procedure well.  Additional Saphenous nerve block performed. 15cc Local Anesthetic mixture placed under ultrasonic guidance along the medio-inferior border of the Sartorious muscle 6 inches above the knee.  No Problems encountered.  Arta Bruce MD   Popliteal block

## 2013-04-13 NOTE — Anesthesia Preprocedure Evaluation (Addendum)
Anesthesia Evaluation  Patient identified by MRN, date of birth, ID band Patient awake    Reviewed: Allergy & Precautions, H&P , NPO status , Patient's Chart, lab work & pertinent test results  Airway Mallampati: I TM Distance: >3 FB Neck ROM: Full    Dental   Pulmonary former smoker,          Cardiovascular hypertension, Pt. on medications + CAD, + Cardiac Stents, + CABG and + Peripheral Vascular Disease + dysrhythmias     Neuro/Psych PSYCHIATRIC DISORDERS TIA Neuromuscular disease CVA    GI/Hepatic   Endo/Other    Renal/GU      Musculoskeletal   Abdominal   Peds  Hematology   Anesthesia Other Findings   Reproductive/Obstetrics                         Anesthesia Physical Anesthesia Plan  ASA: III  Anesthesia Plan: General   Post-op Pain Management:    Induction: Intravenous  Airway Management Planned: LMA  Additional Equipment:   Intra-op Plan:   Post-operative Plan: Extubation in OR  Informed Consent: I have reviewed the patients History and Physical, chart, labs and discussed the procedure including the risks, benefits and alternatives for the proposed anesthesia with the patient or authorized representative who has indicated his/her understanding and acceptance.     Plan Discussed with: CRNA and Surgeon  Anesthesia Plan Comments:         Anesthesia Quick Evaluation

## 2013-04-13 NOTE — Brief Op Note (Addendum)
04/13/2013  8:29 AM  PATIENT:  Chris [name]  71 y.o. 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  LEFT ANKLE ARTHROSCOPY WITH EXTENSIVE DEBRIEDMENT 2.  Arthroscopic treatment of left talar dome osteochondral defect with microfracture  SURGEON:  Toni Arthurs, MD  ASSISTANT: Lorin Picket Flowers, PA-C  ANESTHESIA:   General, regional  EBL:  minimal   TOURNIQUET:   Total Tourniquet Time Documented: Thigh (Left) - 39 minutes Total: Thigh (Left) - 39 minutes   COMPLICATIONS:  None apparent  DISPOSITION:  Extubated, awake and stable to recovery.  DICTATION ID:  [id number]

## 2013-04-13 NOTE — Progress Notes (Signed)
Assisted Dr. Ossey with left, ultrasound guided, popliteal/saphenous block. Side rails up, monitors on throughout procedure. See vital signs in flow sheet. Tolerated Procedure well. 

## 2013-04-13 NOTE — H&P (Signed)
Chris [name] is an 72 y.o. male.   Chief Complaint: left ankle pain HPI: 72 y/o male with left ankle impingement anteriorly and osteochondral defect of the talus.  He presents now for ankle arthroscopy and microfrature v. Retrograde drilling of the lesion.  Past Medical History  Diagnosis Date  . Coronary artery disease     a. 1995 s/p CABG x 3 (VG->RCA, LIMA->LAD, VG->OM);  b. 07/2008 Inf MI/Cath/PCI: VG->RCA 99 - treated with Taxus DES (38mm), LIMA and VG->OM patent, LAD 100, LCX 100, RCA 60d, EF 50%;  c. 09/2008 PCI native RCA  w/ 2.5x23 Xience DES, VG->RCA stent patent;  c. 05/2010 Cath: Native 3VD with 3/3 patent grafts, native RCA w 80% ISR prox to graft insertion-->Med Rx.  . RBBB (right bundle branch block)   . Gout   . Hyperlipidemia   . Obesity   . Hypertension   . ED (erectile dysfunction)   . Vertigo   . Umbilical hernia     a. s/p repair.  . 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 Coronary artery bypass graft  1995    LIMA to LAD, SVG to RCA, SVG to OM.   Marland Kitchen Umbilical hernia repair  12/2007  . Cardiac catheterization  2000    stents x3  . Colonoscopy      Family History  Problem Relation Age of Onset  . Heart disease Father    Social History:  reports that he quit smoking about 42 years ago. His smoking use included Cigarettes. He has a 9 pack-year smoking history. He has quit using smokeless tobacco. His smokeless tobacco use included Chew. He reports that he does not drink alcohol or use illicit drugs.  Allergies: No Known Allergies  Medications Prior to Admission  Medication Sig Dispense Refill  . allopurinol (ZYLOPRIM) 100 MG tablet Take 1 tablet (100 mg total) by mouth daily.  30 tablet  3  . aspirin EC 81 MG tablet Take 1 tablet (81 mg total) by mouth daily.      . CRESTOR 20 MG tablet TAKE 1 TABLET BY MOUTH EVERY DAY  30 tablet  1  . losartan (COZAAR) 25 MG tablet TAKE 1 TABLET BY  MOUTH EVERY DAY  90 tablet  0  . pantoprazole (PROTONIX) 40 MG tablet Take 1 tablet (40 mg total) by mouth daily.  30 tablet  11  . COLCRYS 0.6 MG tablet       . nitroGLYCERIN (NITROSTAT) 0.4 MG SL tablet Place 0.4 mg under the tongue every 5 (five) minutes as needed. For chest pain        Results for orders placed during the hospital encounter of 04/13/13 (from the past 48 hour(s))  BASIC METABOLIC PANEL     Status: Abnormal   Collection Time    04/12/13 10:30 AM      Result Value Range   Sodium 141  135 - 145 mEq/L   Potassium 4.2  3.5 - 5.1 mEq/L   Chloride 104  96 - 112 mEq/L   CO2 25  19 - 32 mEq/L   Glucose, Bld 108 (*) 70 - 99 mg/dL   BUN 17  6 - 23 mg/dL   Creatinine, Ser 0.45  0.50 - 1.35 mg/dL   Calcium 40.9  8.4 - 81.1 mg/dL   GFR calc non Af Amer 60 (*) >90 mL/min   GFR calc Af Amer 69 (*) >  90 mL/min   Comment: (NOTE)     The eGFR has been calculated using the CKD EPI equation.     This calculation has not been validated in all clinical situations.     eGFR's persistently <90 mL/min signify possible Chronic Kidney     Disease.  POCT HEMOGLOBIN-HEMACUE     Status: None   Collection Time    2013/04/14  6:53 AM      Result Value Range   Hemoglobin 16.2  13.0 - 17.0 g/dL   No results found.  ROS  No recent f/c/n/v/wt loss/CP/SOB  Blood pressure 121/75, pulse 65, temperature 98 F (36.7 C), temperature source Oral, resp. rate 20, height 5\' 11"  (1.803 m), weight 111.131 kg (245 lb), SpO2 98.00%. Physical Exam  wn wd male in nad.  A and O x 4.  Mood and affect normal.  EOMI.  Resp unlabored.  L ankle with healthy skin and no swelling or effusion.  Sens to LT around the foot and ankle is normal.  5/5 strength in PF adn DF o fthe ankle.  2+ dp and pt pulses.  No lymphadenopathy. 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 - to OR for arthroscopic debridement and possible microfracture v. Retrograde drilling of the osteochondral lesion.  The  risks and benefits of the alternative treatment options have been discussed in detail.  The patient wishes to proceed with surgery and specifically understands risks of bleeding, infection, nerve damage, blood clots, need for additional surgery, amputation and death.   Toni Arthurs 14-Apr-2013, 7:23 AM

## 2013-04-13 NOTE — Op Note (Signed)
NAME:  Chris [name], Chris [name] NO.:  [id number]  MEDICAL RECORD NO.:  [id number]  LOCATION:                                 FACILITY:  PHYSICIAN:  Toni Arthurs, MD             DATE OF BIRTH:  DATE OF PROCEDURE:  04/13/2013 DATE OF DISCHARGE:                              OPERATIVE REPORT   PREOPERATIVE DIAGNOSES: 1. Left ankle osteochondral defect. 2. Left ankle anterior impingement.  POSTOPERATIVE DIAGNOSES: 1. Left ankle osteochondral defect. 2. Left ankle anterior impingement.  PROCEDURE: 1. Left ankle arthroscopy with extensive debridement. 2. Left ankle microfracture of the osteochondral lesion.  SURGEON:  Toni Arthurs, MD  ASSISTANT:  Lorin Picket Flowers, PA-C  ANESTHESIA:  General, regional.  ESTIMATED BLOOD LOSS:  Minimal.  TOURNIQUET TIME:  39 minutes at 220 mmHg.  COMPLICATIONS:  None apparent.  DISPOSITION:  Extubated awake and stable to recovery.  INDICATIONS FOR PROCEDURE:  The patient is a 72 year old male with a long history of left ankle pain.  He has MRI findings and osteochondral lesion as well as anterior osteophytes.  He presents now for operative treatment of this condition.  He has failed nonoperative treatment to date including activity modification, bracing, physical therapy, and steroid injections.  He understands the risks and benefits, the alternative treatment options and elects surgical treatment.  He specifically understands risks of bleeding, infection, nerve damage, blood clots, need for additional surgery, amputation, and death.  DESCRIPTION OF PROCEDURE:  After preoperative consent was obtained, the correct operative site was identified.  The patient was brought to the operating room and placed supine on the operating table.  General anesthesia was induced.  Preoperative antibiotics were administered. Surgical time-out was taken.  The left lower extremity was prepped and draped in standard sterile fashion with the thigh in a  padded holder and a thigh tourniquet.  The extremity was exsanguinated and tourniquet was inflated to 220 mmHg.  Traction was placed on the left foot using a noninvasive traction device.  An anteromedial arthroscopy portal was established under direct vision.  The arthroscope was inserted into the ankle joint.  Immediately evident was extensive synovitis over the anterior gutter, medial gutter, and lateral gutter.  An anterolateral arthroscopic portal was established under direct vision.  The arthroscopic shaver was inserted into the joint and used to debride the synovitis anteriorly, medially, and laterally.  The ankle joint was noted to be extremely tight.  The arthroscope could not be inserted into the tibiotalar articulation despite maximal traction on the extremity. 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  There were no loose bodies evident.  There was evidence of a prior steroid injection.  The medial talar dome had large osteochondral lesion measured about 1 cm deep, 2 cm from anterior to posterior, and 1 cm from medial to lateral.  This was on the shoulder.  There were unstable flaps of cartilage surrounding this lesion.  The arthroscopic shaver was used to debride the loose cartilage back to a stable rim.  Microfracture awl was then used to perforate the subchondral bone at the  posterior aspect of the lesion.  The cystic portion of the lesion was debrided all of its fibrous tissue.  There were fat globules evident from the microfracture holes as well as the cystic lesion after debridement.  The anterior talar neck was then noted to have a large osteophyte.  This was resected with shaver and bur.  The wound was then irrigated and all arthroscopic instruments were removed.  Horizontal mattress sutures of 3- 0 nylon were used to close the 2 portals.  Sterile dressings were applied and followed by compression wrap.   Tourniquet was released at 39 minutes.  The patient was awakened from anesthesia and transported to recovery room in stable condition.  FOLLOWUP PLAN:  The patient will be weightbearing as tolerated on the left foot and a CAM boot.  He will work on active range of motion and follow up with me in 2 weeks.  Scott Flowers, PA-C was present, scrubbed for the duration of the case. His assistance was critical in gaining and maintaining exposure, performed the operation, closing the wounds, and applying the dressings.     Toni Arthurs, MD     JH/MEDQ  D:  04/13/2013  T:  04/13/2013  Job:  [id number]

## 2013-04-13 NOTE — Anesthesia Postprocedure Evaluation (Signed)
Anesthesia Post Note  Patient: Chris [name]  Procedure(s) Performed: Procedure(s) (LRB): LEFT ANKLE ARTHROSCOPY WITH EXTENSIVE DEBRIEDMENT (Left)  Anesthesia type: general  Patient location: PACU  Post pain: Pain level controlled  Post assessment: Patient's Cardiovascular Status Stable  Last Vitals:  Filed Vitals:   04/13/13 0945  BP: 108/63  Pulse: 71  Temp:   Resp: 14    Post vital signs: Reviewed and stable  Level of consciousness: sedated  Complications: No apparent anesthesia complications

## 2013-04-14 ENCOUNTER — Encounter (HOSPITAL_BASED_OUTPATIENT_CLINIC_OR_DEPARTMENT_OTHER): Payer: Self-pay | Admitting: Orthopedic Surgery

## 2013-04-17 ENCOUNTER — Encounter (HOSPITAL_BASED_OUTPATIENT_CLINIC_OR_DEPARTMENT_OTHER): Payer: Self-pay | Admitting: Orthopedic Surgery

## 2013-05-15 ENCOUNTER — Telehealth: Payer: Self-pay | Admitting: Family Medicine

## 2013-05-15 MED ORDER — PANTOPRAZOLE SODIUM 40 MG PO TBEC: 40.0000 mg | DELAYED_RELEASE_TABLET | Freq: Every day | ORAL | Status: DC

## 2013-05-15 NOTE — Telephone Encounter (Signed)
Rx Refilled  

## 2013-05-15 NOTE — Telephone Encounter (Signed)
Needs Protonix Rx sent to [facility]

## 2013-06-13 ENCOUNTER — Other Ambulatory Visit: Payer: Self-pay | Admitting: Family Medicine

## 2013-06-24 ENCOUNTER — Other Ambulatory Visit: Payer: Self-pay | Admitting: Cardiology

## 2013-08-03 ENCOUNTER — Encounter (HOSPITAL_COMMUNITY): Payer: Self-pay | Admitting: Pharmacy Technician

## 2013-08-08 ENCOUNTER — Encounter (HOSPITAL_COMMUNITY)
Admission: RE | Admit: 2013-08-08 | Discharge: 2013-08-08 | Disposition: A | Discharge status: Home / Self Care | Payer: Medicare Other | Source: Ambulatory Visit | Attending: Orthopedic Surgery | Admitting: Orthopedic Surgery

## 2013-08-08 ENCOUNTER — Encounter (HOSPITAL_COMMUNITY): Payer: Self-pay

## 2013-08-08 ENCOUNTER — Encounter (HOSPITAL_COMMUNITY)
Admission: RE | Admit: 2013-08-08 | Discharge: 2013-08-08 | Disposition: A | Discharge status: Home / Self Care | Payer: Medicare Other | Source: Ambulatory Visit | Attending: Anesthesiology | Admitting: Anesthesiology

## 2013-08-08 DIAGNOSIS — Z01818 Encounter for other preprocedural examination: Secondary | ICD-10-CM | POA: Insufficient documentation

## 2013-08-08 DIAGNOSIS — Z01810 Encounter for preprocedural cardiovascular examination: Secondary | ICD-10-CM | POA: Insufficient documentation

## 2013-08-08 DIAGNOSIS — Z01812 Encounter for preprocedural laboratory examination: Secondary | ICD-10-CM | POA: Insufficient documentation

## 2013-08-08 HISTORY — DX: Acute myocardial infarction, unspecified: I21.9

## 2013-08-08 HISTORY — DX: Gastro-esophageal reflux disease without esophagitis: K21.9

## 2013-08-08 HISTORY — DX: Polyneuropathy, unspecified: G62.9

## 2013-08-08 HISTORY — DX: Unspecified osteoarthritis, unspecified site: M19.90

## 2013-08-08 LAB — APTT: aPTT: 28 seconds (ref 24–37)

## 2013-08-08 LAB — BASIC METABOLIC PANEL WITH GFR
BUN: 15 mg/dL (ref 6–23)
CO2: 23 meq/L (ref 19–32)
Calcium: 9.3 mg/dL (ref 8.4–10.5)
Chloride: 102 meq/L (ref 96–112)
Creatinine, Ser: 1.1 mg/dL (ref 0.50–1.35)
GFR calc Af Amer: 77 mL/min — ABNORMAL LOW
GFR calc non Af Amer: 66 mL/min — ABNORMAL LOW
Glucose, Bld: 180 mg/dL — ABNORMAL HIGH (ref 70–99)
Potassium: 4.2 meq/L (ref 3.7–5.3)
Sodium: 140 meq/L (ref 137–147)

## 2013-08-08 LAB — TYPE AND SCREEN
ABO/RH(D): A NEG
Antibody Screen: NEGATIVE

## 2013-08-08 LAB — CBC
HCT: 44.8 % (ref 39.0–52.0)
Hemoglobin: 15.2 g/dL (ref 13.0–17.0)
MCH: 32 pg (ref 26.0–34.0)
MCHC: 33.9 g/dL (ref 30.0–36.0)
MCV: 94.3 fL (ref 78.0–100.0)
Platelets: 190 10*3/uL (ref 150–400)
RBC: 4.75 MIL/uL (ref 4.22–5.81)
RDW: 13.5 % (ref 11.5–15.5)
WBC: 5.8 10*3/uL (ref 4.0–10.5)

## 2013-08-08 LAB — PROTIME-INR
INR: 0.96 (ref 0.00–1.49)
Prothrombin Time: 12.6 s (ref 11.6–15.2)

## 2013-08-08 LAB — ABO/RH: ABO/RH(D): A NEG

## 2013-08-08 NOTE — Progress Notes (Signed)
08/08/13 1015  OBSTRUCTIVE SLEEP APNEA  Have you ever been diagnosed with sleep apnea through a sleep study? No  Do you snore loudly (loud enough to be heard through closed doors)?  1  Do you often feel tired, fatigued, or sleepy during the daytime? 0  Has anyone observed you stop breathing during your sleep? 0  Do you have, or are you being treated for high blood pressure? 1  BMI more than 35 kg/m2? 1  Age over 71 years old? 1  Neck circumference greater than 40 cm/18 inches? 0 (17)  Gender: 1  Obstructive Sleep Apnea Score 5  Score 4 or greater  Results sent to PCP

## 2013-08-08 NOTE — Pre-Procedure Instructions (Addendum)
Chris [name]  08/08/2013   Your procedure is scheduled on:  08/17/13  Report to [facility] short stay admitting at 1130 AM.  Call this number if you have problems the morning of surgery: [phone]   Remember:   Do not eat food or drink liquids after midnight.   Take these medicines the morning of surgery with A SIP OF WATER: none         STOP all herbel meds, nsaids (aleve,naproxen,advil,ibuprofen) 5 days prior to surgery including aspirins, vitamins   Do not wear jewelry, make-up or nail polish.  Do not wear lotions, powders, or perfumes. You may wear deodorant.  Do not shave 48 hours prior to surgery. Men may shave face and neck.  Do not bring valuables to the hospital.  [facility] is not responsible                  for any belongings or valuables.               Contacts, dentures or bridgework may not be worn into surgery.  Leave suitcase in the car. After surgery it may be brought to your room.  For patients admitted to the hospital, discharge time is determined by your                treatment team.               Patients discharged the day of surgery will not be allowed to drive  home.  Name and phone number of your driver:   Special Instructions:  Special Instructions: [place] - Preparing for Surgery  Before surgery, you can play an important role.  Because skin is not sterile, your skin needs to be as free of germs as possible.  You can reduce the number of germs on you skin by washing with CHG (chlorahexidine gluconate) soap before surgery.  CHG is an antiseptic cleaner which kills germs and bonds with the skin to continue killing germs even after washing.  Please DO NOT use if you have an allergy to CHG or antibacterial soaps.  If your skin becomes reddened/irritated stop using the CHG and inform your nurse when you arrive at Short Stay.  Do not shave (including legs and underarms) for at least 48 hours prior to the first CHG shower.  You may shave your  face.  Please follow these instructions carefully:   1.  Shower with CHG Soap the night before surgery and the morning of Surgery.  2.  If you choose to wash your hair, wash your hair first as usual with your normal shampoo.  3.  After you shampoo, rinse your hair and body thoroughly to remove the Shampoo.  4.  Use CHG as you would any other liquid soap.  You can apply chg directly  to the skin and wash gently with scrungie or a clean washcloth.  5.  Apply the CHG Soap to your body ONLY FROM THE NECK DOWN.  Do not use on open wounds or open sores.  Avoid contact with your eyes ears, mouth and genitals (private parts).  Wash genitals (private parts)       with your normal soap.  6.  Wash thoroughly, paying special attention to the area where your surgery will be performed.  7.  Thoroughly rinse your body with warm water from the neck down.  8.  DO NOT shower/wash with your normal soap after using and rinsing off the CHG Soap.  9.  Pat yourself dry with a clean towel.            10.  Wear clean pajamas.            11.  Place clean sheets on your bed the night of your first shower and do not sleep with pets.  Day of Surgery  Do not apply any lotions/deodorants the morning of surgery.  Please wear clean clothes to the hospital/surgery center.   Please read over the following fact sheets that you were given: Pain Booklet, Coughing and Deep Breathing, Blood Transfusion Information and Surgical Site Infection Prevention

## 2013-08-08 NOTE — Progress Notes (Signed)
req'd notes, ekg, tests from dr Einar Gip.to see him again in april

## 2013-08-09 NOTE — Progress Notes (Signed)
Anesthesia Chart Review: Patient is a 73 year old male scheduled for left total ankle replacement with possible gastroc recession on 08/17/13 by Dr. Doran Durand.  History includes CAD/MI s/p CABG (LIMA to LAD, SVG to RCA, SVG to OM) in 1995 with DES to SVG-RCA '99 and DES to native RCA '10, former smoker, HTN, HLD, ED, GERD, gout, vertigo, neuropathy, umbilical hernia repair, right carotid artery stenosis, right BBB. BMI is 34.5 consistent with obesity. OSA screening score was a 5. PCP is Dr. Dennard Schaumann. Cardiologist is [name], last visit 02/20/13 for follow-up and preoperative evaluation prior to his last ankle surgery on 04/13/13.  EKG on 08/08/13 showed SB @ 58 bpm, right BBB, left posterior fascicular block, bifascicular block, inferior-posterior infarct (age undetermined).  EKG was not felt significantly changes when compared to prior tracing.   - Echocardiogram on 01/25/2013 showed left ventricular cavity is normal in size. Mild inferior hypokinesia. Lower limit systolic global function. Calculated EF 50%. Visual EF is 50-55%. Doppler evidence of grade 1 (impaired) diastolic dysfunction. Mitral valve structurally normal. Trace mitral regurgitation. Mitral valve inflow A > E ratio.  - Nuclear stress test on 02/06/2013 showed resting EKG demonstrates normal sinus rhythm, inferior infarct, old right bundle branch block, cannot exclude true posterior infarct. Low voltage complexes, nonspecific T wave changes. Stress EKG was nondiagnostic for ischemia. No ST-T changes of ischemia noted with pharmacological stress testing. Stress symptoms included shortness of breath and lightheadedness. Stress terminated due to completion of protocol. Perfusion imaging study demonstrates a moderate to large sized inferior, inferolateral transmural scar with no significant peri-infarct ischemia. Left ventricular systolic function calculated by the OGS was moderately depressed. Left ventricular ejection fraction was estimated to be  34%. This represents a low risk study. Patient has prior history of myocardial infarction known CAD.  - The last cardiac cath report found in Epic was from 06/04/10 and showed: 3 vessel CAD, status post three-vessel CABG with 3 of 3 patent bypass grafts. Native RCA with proximal disease in several areas of in-stent restenosis in the mid distal vessel. However these lesions are all proximal to the insertion of the graft which be the entire vessel as well the distal segment including the posterior lateral branch and posterior descending artery.  - Carotid artery duplex on 02/13/2013 showed moderate stenosis of the right distal ICA, mid ICA, and proximal ICA greater than 50% stenosis in the range of 50-69%. Mild stenosis of the right bulb less than 50% stenosis in the range of 16-49%. There is evidence of heterogeneous plaque in the right external carotid artery. The right vessel geometry is torturous. No evidence of hemodynamically significant stenosis in the left carotid bifurcation vessels. The left vessel geometry is torturous.  Chest x-ray on 08/08/2013 showed cardiomegaly without evidence of active cardiopulmonary disease. Moderate to large hiatal hernia.  Preoperative labs noted. Non-fasting glucose was 180.  He denied any known history of DM.  Will ask staff to get a fasting CBG on arrival for fasting baseline glucose.  Patient with low risk stress test in 01/2013 (EF 34% but was 50-55% by echo).  He has since tolerated previous ankle surgery. Further evaluation by his assigned anesthesiologist on the day of surgery, but if no acute CV/CHF symptoms then I anticipate that he can proceed as planned.  [facility] Short Stay Center/Anesthesiology Phone [phone] 08/09/2013 2:03 PM

## 2013-08-10 NOTE — Progress Notes (Signed)
Re-requested cardiac testing and OV notes from Dr Irven Shelling office.  [phone])

## 2013-08-12 ENCOUNTER — Other Ambulatory Visit: Payer: Self-pay | Admitting: Family Medicine

## 2013-08-14 ENCOUNTER — Encounter: Payer: Self-pay | Admitting: Family Medicine

## 2013-08-14 NOTE — Telephone Encounter (Signed)
Medication refill for one time only.  Patient needs to be seen.  Letter sent for patient to call and schedule 

## 2013-08-16 ENCOUNTER — Other Ambulatory Visit: Payer: Self-pay | Admitting: Orthopedic Surgery

## 2013-08-16 MED ORDER — CEFAZOLIN SODIUM-DEXTROSE 2-3 GM-% IV SOLR
2.0000 g | INTRAVENOUS | Status: AC
  Administered 2013-08-17: 2 g via INTRAVENOUS
  Filled 2013-08-16: qty 50

## 2013-08-16 MED ORDER — SODIUM CHLORIDE 0.9 % IV SOLN: INTRAVENOUS | Status: DC

## 2013-08-16 NOTE — H&P (Signed)
Chris [name] is an 73 y.o. male.   Chief Complaint: Left ankle pain  HPI: Pt reports to OR today for left ankle replacement.  Pt has failed conservative management of his severe left ankle arthritis and seeks surgical correction of this condition.  Past Medical History  Diagnosis Date  . Coronary artery disease     a. 1995 s/p CABG x 3 (VG->RCA, LIMA->LAD, VG->OM);  b. 07/2008 Inf MI/Cath/PCI: VG->RCA 99 - treated with Taxus DES (93mm), LIMA and VG->OM patent, LAD 100, LCX 100, RCA 60d, EF 50%;  c. 09/2008 PCI native RCA  w/ 2.5x23 Xience DES, VG->RCA stent patent;  c. 05/2010 Cath: Native 3VD with 3/3 patent grafts, native RCA w 80% ISR prox to graft insertion-->Med Rx.  . RBBB (right bundle branch block)   . Gout   . Hyperlipidemia   . Obesity   . Hypertension   . ED (erectile dysfunction)   . Vertigo   . Umbilical hernia     a. s/p repair.  . 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 Myocardial infarction   . GERD (gastroesophageal reflux disease)     occ  . Arthritis   . Neuropathy     toes    Past Surgical History  Procedure Laterality Date  . Coronary artery bypass graft  1995    LIMA to LAD, SVG to RCA, SVG to OM.   Marland Kitchen Umbilical hernia repair  12/2007  . Cardiac catheterization  2000    stents x3  . Colonoscopy    . Ankle arthroscopy with drilling/microfracture Left 04/13/2013    Procedure: LEFT ANKLE ARTHROSCOPY WITH EXTENSIVE DEBRIEDMENT;  Surgeon: Wylene Simmer, MD;  Location: [facility];  Service: Orthopedics;  Laterality: Left;    Family History  Problem Relation Age of Onset  . Heart disease Father    Social History:  reports that he quit smoking about 42 years ago. His smoking use included Cigarettes. He has a 9 pack-year smoking history. He has quit using smokeless tobacco. His smokeless tobacco use included Chew. He reports that he does not drink alcohol or use illicit drugs.  Allergies: No Known Allergies  No prescriptions  prior to admission    No results found for this or any previous visit (from the past 48 hour(s)). No results found.  Review of Systems  Constitutional: Negative.   HENT: Negative.   Eyes: Negative.   Respiratory: Negative.   Cardiovascular: Negative.   Gastrointestinal: Negative.   Musculoskeletal: Negative.   Skin: Negative.   Neurological: Negative for loss of consciousness.  Endo/Heme/Allergies: Negative.   Psychiatric/Behavioral: The patient is not nervous/anxious.     There were no vitals taken for this visit. Physical Exam  73y/o WD WN male in NAD, A/Ox3, appears stated age.  EOMI, mood and affect normal, respirations unlabored.  Gait heel toe reciprocal b/l with antalgia to the left.  +TTP to medial and lateral ankle gutter sites. Strength 5/5 with DF, PF, inversion and eversion. DP pulses 2+ b/l. Assessment/Plan Pt reports to OR for left total ankle replacement.   FLOWERS, CHRISTOPHER S 08/16/2013, 3:23 PM  Pt seen and examined.  Agree with note above.  The risks and benefits of the alternative treatment options have been discussed in detail.  The patient wishes to proceed with surgery and specifically understands risks of bleeding, infection, nerve damage, blood clots, need for additional surgery, amputation and death.

## 2013-08-17 ENCOUNTER — Inpatient Hospital Stay (HOSPITAL_COMMUNITY): Payer: Medicare Other

## 2013-08-17 ENCOUNTER — Encounter (HOSPITAL_COMMUNITY): Payer: Self-pay

## 2013-08-17 ENCOUNTER — Inpatient Hospital Stay (HOSPITAL_COMMUNITY): Payer: Medicare Other | Admitting: Anesthesiology

## 2013-08-17 ENCOUNTER — Inpatient Hospital Stay (HOSPITAL_COMMUNITY)
Admission: RE | Admit: 2013-08-17 | Discharge: 2013-08-18 | DRG: 470 | Disposition: A | Discharge status: Home / Self Care | Payer: Medicare Other | Source: Ambulatory Visit | Attending: Orthopedic Surgery | Admitting: Orthopedic Surgery

## 2013-08-17 ENCOUNTER — Encounter (HOSPITAL_COMMUNITY)
Admission: RE | Disposition: A | Discharge status: Home / Self Care | Payer: Self-pay | Source: Ambulatory Visit | Attending: Orthopedic Surgery

## 2013-08-17 ENCOUNTER — Encounter (HOSPITAL_COMMUNITY): Payer: Medicare Other | Admitting: Vascular Surgery

## 2013-08-17 DIAGNOSIS — I252 Old myocardial infarction: Secondary | ICD-10-CM

## 2013-08-17 DIAGNOSIS — I2510 Atherosclerotic heart disease of native coronary artery without angina pectoris: Secondary | ICD-10-CM | POA: Diagnosis present

## 2013-08-17 DIAGNOSIS — G5790 Unspecified mononeuropathy of unspecified lower limb: Secondary | ICD-10-CM | POA: Diagnosis present

## 2013-08-17 DIAGNOSIS — Z87891 Personal history of nicotine dependence: Secondary | ICD-10-CM

## 2013-08-17 DIAGNOSIS — I4510 Unspecified right bundle-branch block: Secondary | ICD-10-CM | POA: Diagnosis present

## 2013-08-17 DIAGNOSIS — I10 Essential (primary) hypertension: Secondary | ICD-10-CM | POA: Diagnosis present

## 2013-08-17 DIAGNOSIS — E785 Hyperlipidemia, unspecified: Secondary | ICD-10-CM | POA: Diagnosis present

## 2013-08-17 DIAGNOSIS — K219 Gastro-esophageal reflux disease without esophagitis: Secondary | ICD-10-CM | POA: Diagnosis present

## 2013-08-17 DIAGNOSIS — Z951 Presence of aortocoronary bypass graft: Secondary | ICD-10-CM

## 2013-08-17 DIAGNOSIS — M19079 Primary osteoarthritis, unspecified ankle and foot: Principal | ICD-10-CM | POA: Diagnosis present

## 2013-08-17 DIAGNOSIS — I739 Peripheral vascular disease, unspecified: Secondary | ICD-10-CM | POA: Diagnosis present

## 2013-08-17 DIAGNOSIS — M109 Gout, unspecified: Secondary | ICD-10-CM | POA: Diagnosis present

## 2013-08-17 DIAGNOSIS — Z9861 Coronary angioplasty status: Secondary | ICD-10-CM

## 2013-08-17 DIAGNOSIS — M19072 Primary osteoarthritis, left ankle and foot: Secondary | ICD-10-CM | POA: Diagnosis present

## 2013-08-17 DIAGNOSIS — Z8249 Family history of ischemic heart disease and other diseases of the circulatory system: Secondary | ICD-10-CM

## 2013-08-17 DIAGNOSIS — I6529 Occlusion and stenosis of unspecified carotid artery: Secondary | ICD-10-CM | POA: Diagnosis present

## 2013-08-17 HISTORY — DX: Personal history of other diseases of the digestive system: Z87.19

## 2013-08-17 HISTORY — PX: TOTAL ANKLE ARTHROPLASTY: SHX811

## 2013-08-17 LAB — GLUCOSE, CAPILLARY
Glucose-Capillary: 137 mg/dL — ABNORMAL HIGH (ref 70–99)
Glucose-Capillary: 177 mg/dL — ABNORMAL HIGH (ref 70–99)

## 2013-08-17 SURGERY — ARTHROPLASTY, ANKLE, TOTAL
Anesthesia: Regional | Site: Ankle | Laterality: Left

## 2013-08-17 MED ORDER — MIDAZOLAM HCL 2 MG/2ML IJ SOLN
INTRAMUSCULAR | Status: AC
  Administered 2013-08-17: 1 mg
  Filled 2013-08-17: qty 2

## 2013-08-17 MED ORDER — MIDAZOLAM HCL 2 MG/2ML IJ SOLN
INTRAMUSCULAR | Status: AC
  Filled 2013-08-17: qty 2

## 2013-08-17 MED ORDER — PNEUMOCOCCAL VAC POLYVALENT 25 MCG/0.5ML IJ INJ
0.5000 mL | INJECTION | INTRAMUSCULAR | Status: AC
  Administered 2013-08-18: 0.5 mL via INTRAMUSCULAR
  Filled 2013-08-17 (×2): qty 0.5

## 2013-08-17 MED ORDER — ONDANSETRON HCL 4 MG/2ML IJ SOLN
4.0000 mg | Freq: Four times a day (QID) | INTRAMUSCULAR | Status: DC | PRN
Start: 2013-08-17 — End: 2013-08-18

## 2013-08-17 MED ORDER — LACTATED RINGERS IV SOLN
INTRAVENOUS | Status: DC
  Administered 2013-08-17: 13:00:00 via INTRAVENOUS

## 2013-08-17 MED ORDER — ENOXAPARIN SODIUM 30 MG/0.3ML ~~LOC~~ SOLN
30.0000 mg | SUBCUTANEOUS | Status: DC
  Filled 2013-08-17 (×2): qty 0.3

## 2013-08-17 MED ORDER — EPHEDRINE SULFATE 50 MG/ML IJ SOLN
INTRAMUSCULAR | Status: DC | PRN
  Administered 2013-08-17: 10 mg via INTRAVENOUS

## 2013-08-17 MED ORDER — LACTATED RINGERS IV SOLN
INTRAVENOUS | Status: DC | PRN
  Administered 2013-08-17 (×2): via INTRAVENOUS

## 2013-08-17 MED ORDER — ENOXAPARIN SODIUM 30 MG/0.3ML ~~LOC~~ SOLN
30.0000 mg | SUBCUTANEOUS | Status: DC
  Filled 2013-08-17: qty 0.3

## 2013-08-17 MED ORDER — ARTIFICIAL TEARS OP OINT
TOPICAL_OINTMENT | OPHTHALMIC | Status: AC
  Filled 2013-08-17: qty 3.5

## 2013-08-17 MED ORDER — OXYCODONE HCL 5 MG PO TABS
5.0000 mg | ORAL_TABLET | ORAL | Status: DC | PRN
  Administered 2013-08-18: 10 mg via ORAL
  Administered 2013-08-18 (×2): 5 mg via ORAL
  Filled 2013-08-17: qty 2
  Filled 2013-08-17 (×2): qty 1

## 2013-08-17 MED ORDER — PHENYLEPHRINE HCL 10 MG/ML IJ SOLN
INTRAMUSCULAR | Status: AC
  Filled 2013-08-17: qty 1

## 2013-08-17 MED ORDER — PROPOFOL 10 MG/ML IV BOLUS
INTRAVENOUS | Status: DC | PRN
  Administered 2013-08-17: 150 mg via INTRAVENOUS

## 2013-08-17 MED ORDER — METOCLOPRAMIDE HCL 5 MG PO TABS
5.0000 mg | ORAL_TABLET | Freq: Three times a day (TID) | ORAL | Status: DC | PRN
  Filled 2013-08-17: qty 2

## 2013-08-17 MED ORDER — MORPHINE SULFATE 2 MG/ML IJ SOLN: 1.0000 mg | INTRAMUSCULAR | Status: DC | PRN

## 2013-08-17 MED ORDER — FENTANYL CITRATE 0.05 MG/ML IJ SOLN
INTRAMUSCULAR | Status: AC
  Filled 2013-08-17: qty 5

## 2013-08-17 MED ORDER — METOCLOPRAMIDE HCL 5 MG/ML IJ SOLN: 5.0000 mg | Freq: Three times a day (TID) | INTRAMUSCULAR | Status: DC | PRN

## 2013-08-17 MED ORDER — PHENYLEPHRINE HCL 10 MG/ML IJ SOLN
INTRAMUSCULAR | Status: DC | PRN
  Administered 2013-08-17 (×5): 80 ug via INTRAVENOUS

## 2013-08-17 MED ORDER — 0.9 % SODIUM CHLORIDE (POUR BTL) OPTIME
TOPICAL | Status: DC | PRN
  Administered 2013-08-17: 1000 mL

## 2013-08-17 MED ORDER — SODIUM CHLORIDE 0.9 % IV SOLN
10.0000 mg | INTRAVENOUS | Status: DC | PRN
  Administered 2013-08-17: 20 ug/min via INTRAVENOUS

## 2013-08-17 MED ORDER — ONDANSETRON HCL 4 MG/2ML IJ SOLN
INTRAMUSCULAR | Status: DC | PRN
  Administered 2013-08-17: 4 mg via INTRAVENOUS

## 2013-08-17 MED ORDER — SODIUM CHLORIDE 0.9 % IV SOLN
INTRAVENOUS | Status: DC
  Administered 2013-08-18: 04:00:00 via INTRAVENOUS

## 2013-08-17 MED ORDER — BUPIVACAINE HCL (PF) 0.5 % IJ SOLN
INTRAMUSCULAR | Status: DC | PRN
  Administered 2013-08-17: 15 mL

## 2013-08-17 MED ORDER — ONDANSETRON HCL 4 MG PO TABS: 4.0000 mg | ORAL_TABLET | Freq: Four times a day (QID) | ORAL | Status: DC | PRN

## 2013-08-17 MED ORDER — PROPOFOL 10 MG/ML IV BOLUS
INTRAVENOUS | Status: AC
  Filled 2013-08-17: qty 20

## 2013-08-17 MED ORDER — DOCUSATE SODIUM 100 MG PO CAPS
100.0000 mg | ORAL_CAPSULE | Freq: Two times a day (BID) | ORAL | Status: DC
  Administered 2013-08-17: 100 mg via ORAL
  Filled 2013-08-17 (×3): qty 1

## 2013-08-17 MED ORDER — ATORVASTATIN CALCIUM 10 MG PO TABS
10.0000 mg | ORAL_TABLET | Freq: Every day | ORAL | Status: DC
  Administered 2013-08-17: 10 mg via ORAL
  Filled 2013-08-17 (×2): qty 1

## 2013-08-17 MED ORDER — PHENYLEPHRINE 40 MCG/ML (10ML) SYRINGE FOR IV PUSH (FOR BLOOD PRESSURE SUPPORT)
PREFILLED_SYRINGE | INTRAVENOUS | Status: AC
  Filled 2013-08-17: qty 10

## 2013-08-17 MED ORDER — FENTANYL CITRATE 0.05 MG/ML IJ SOLN
INTRAMUSCULAR | Status: AC
  Administered 2013-08-17: 100 ug
  Filled 2013-08-17: qty 2

## 2013-08-17 MED ORDER — BUPIVACAINE-EPINEPHRINE PF 0.5-1:200000 % IJ SOLN
INTRAMUSCULAR | Status: DC | PRN
  Administered 2013-08-17: 30 mL via PERINEURAL

## 2013-08-17 MED ORDER — ONDANSETRON HCL 4 MG/2ML IJ SOLN
INTRAMUSCULAR | Status: AC
  Filled 2013-08-17: qty 2

## 2013-08-17 MED ORDER — BACITRACIN ZINC 500 UNIT/GM EX OINT
TOPICAL_OINTMENT | CUTANEOUS | Status: AC
  Filled 2013-08-17: qty 15

## 2013-08-17 MED ORDER — LIDOCAINE HCL (CARDIAC) 20 MG/ML IV SOLN
INTRAVENOUS | Status: AC
  Filled 2013-08-17: qty 5

## 2013-08-17 MED ORDER — LOSARTAN POTASSIUM 25 MG PO TABS
25.0000 mg | ORAL_TABLET | Freq: Every day | ORAL | Status: DC
  Administered 2013-08-18: 25 mg via ORAL
  Filled 2013-08-17 (×2): qty 1

## 2013-08-17 MED ORDER — FENTANYL CITRATE 0.05 MG/ML IJ SOLN
INTRAMUSCULAR | Status: DC | PRN
  Administered 2013-08-17: 25 ug via INTRAVENOUS

## 2013-08-17 MED ORDER — SENNA 8.6 MG PO TABS
1.0000 | ORAL_TABLET | Freq: Two times a day (BID) | ORAL | Status: DC
  Administered 2013-08-17: 8.6 mg via ORAL
  Filled 2013-08-17 (×3): qty 1

## 2013-08-17 MED ORDER — DEXAMETHASONE SODIUM PHOSPHATE 4 MG/ML IJ SOLN
INTRAMUSCULAR | Status: AC
Start: 2013-08-17 — End: 2013-08-17
  Filled 2013-08-17: qty 1

## 2013-08-17 MED ORDER — DEXAMETHASONE SODIUM PHOSPHATE 4 MG/ML IJ SOLN
INTRAMUSCULAR | Status: DC | PRN
  Administered 2013-08-17: 4 mg via INTRAVENOUS

## 2013-08-17 MED ORDER — LIDOCAINE HCL (CARDIAC) 10 MG/ML IV SOLN
INTRAVENOUS | Status: DC | PRN
  Administered 2013-08-17: 60 mg via INTRAVENOUS

## 2013-08-17 SURGICAL SUPPLY — 64 items
BANDAGE ESMARK 6X9 LF (GAUZE/BANDAGES/DRESSINGS) ×1 IMPLANT
BLADE RECIP (BLADE) ×3 IMPLANT
BLADE RECIPRO TAPERED (BLADE) ×3 IMPLANT
BLADE SAW (BLADE) ×3 IMPLANT
BLADE SURG 15 STRL LF DISP TIS (BLADE) ×2 IMPLANT
BLADE SURG 15 STRL SS (BLADE) ×4
BNDG ESMARK 6X9 LF (GAUZE/BANDAGES/DRESSINGS) ×3
CANISTER SUCT 3000ML (MISCELLANEOUS) ×3 IMPLANT
CHLORAPREP W/TINT 26ML (MISCELLANEOUS) ×3 IMPLANT
CORE SLIDING STAR SZ 7MM (Orthopedic Implant) ×3 IMPLANT
COVER SURGICAL LIGHT HANDLE (MISCELLANEOUS) ×3 IMPLANT
CUFF TOURNIQUET SINGLE 34IN LL (TOURNIQUET CUFF) ×3 IMPLANT
CUFF TOURNIQUET SINGLE 44IN (TOURNIQUET CUFF) IMPLANT
DISPOSABLES PACK SBI (PACKS) ×3 IMPLANT
DRAPE C-ARM 42X72 X-RAY (DRAPES) ×3 IMPLANT
DRAPE C-ARMOR (DRAPES) ×3 IMPLANT
DRAPE EXTREMITY T 121X128X90 (DRAPE) ×3 IMPLANT
DRAPE ORTHO SPLIT 77X108 STRL (DRAPES) ×2
DRAPE SURG ORHT 6 SPLT 77X108 (DRAPES) ×1 IMPLANT
DRAPE U-SHAPE 47X51 STRL (DRAPES) ×3 IMPLANT
DRSG ADAPTIC 3X8 NADH LF (GAUZE/BANDAGES/DRESSINGS) ×3 IMPLANT
DRSG MEPILEX BORDER 4X4 (GAUZE/BANDAGES/DRESSINGS) ×3 IMPLANT
DRSG MEPITEL 4X7.2 (GAUZE/BANDAGES/DRESSINGS) ×3 IMPLANT
DRSG PAD ABDOMINAL 8X10 ST (GAUZE/BANDAGES/DRESSINGS) ×3 IMPLANT
ELECT REM PT RETURN 9FT ADLT (ELECTROSURGICAL) ×3
ELECTRODE REM PT RTRN 9FT ADLT (ELECTROSURGICAL) ×1 IMPLANT
EVACUATOR 1/8 PVC DRAIN (DRAIN) IMPLANT
GLOVE BIO SURGEON STRL SZ7 (GLOVE) ×3 IMPLANT
GLOVE BIO SURGEON STRL SZ8 (GLOVE) ×3 IMPLANT
GLOVE BIO SURGEON STRL SZ8.5 (GLOVE) ×3 IMPLANT
GLOVE BIOGEL PI IND STRL 7.0 (GLOVE) ×1 IMPLANT
GLOVE BIOGEL PI IND STRL 7.5 (GLOVE) ×1 IMPLANT
GLOVE BIOGEL PI IND STRL 8 (GLOVE) ×1 IMPLANT
GLOVE BIOGEL PI INDICATOR 7.0 (GLOVE) ×2
GLOVE BIOGEL PI INDICATOR 7.5 (GLOVE) ×2
GLOVE BIOGEL PI INDICATOR 8 (GLOVE) ×2
GLOVE ECLIPSE 6.5 STRL STRAW (GLOVE) ×3 IMPLANT
GLOVE ORTHO TXT STRL SZ7.5 (GLOVE) ×3 IMPLANT
GOWN STRL REUS W/ TWL LRG LVL3 (GOWN DISPOSABLE) ×2 IMPLANT
GOWN STRL REUS W/ TWL XL LVL3 (GOWN DISPOSABLE) ×1 IMPLANT
GOWN STRL REUS W/TWL LRG LVL3 (GOWN DISPOSABLE) ×4
GOWN STRL REUS W/TWL XL LVL3 (GOWN DISPOSABLE) ×2
IMPLANT TALAR STAR SZ S LF (Orthopedic Implant) ×3 IMPLANT
IMPLANT TIBIAL STAR SZ L (Orthopedic Implant) ×3 IMPLANT
KIT BASIN OR (CUSTOM PROCEDURE TRAY) ×3 IMPLANT
KIT ROOM TURNOVER OR (KITS) ×3 IMPLANT
NS IRRIG 1000ML POUR BTL (IV SOLUTION) ×3 IMPLANT
PACK TOTAL JOINT (CUSTOM PROCEDURE TRAY) ×3 IMPLANT
PAD ARMBOARD 7.5X6 YLW CONV (MISCELLANEOUS) ×6 IMPLANT
PAD CAST 4YDX4 CTTN HI CHSV (CAST SUPPLIES) ×1 IMPLANT
PADDING CAST COTTON 4X4 STRL (CAST SUPPLIES) ×2
PADDING CAST COTTON 6X4 STRL (CAST SUPPLIES) ×3 IMPLANT
SPLINT FIBERGLASS 4X15 (CAST SUPPLIES) ×3 IMPLANT
SPONGE GAUZE 4X4 12PLY (GAUZE/BANDAGES/DRESSINGS) ×6 IMPLANT
SPONGE GAUZE 4X4 12PLY STER LF (GAUZE/BANDAGES/DRESSINGS) ×3 IMPLANT
SUCTION FRAZIER TIP 10 FR DISP (SUCTIONS) ×3 IMPLANT
SUT ETHILON 3 0 PS 1 (SUTURE) ×3 IMPLANT
SUT MNCRL AB 3-0 PS2 18 (SUTURE) ×6 IMPLANT
SUT PROLENE 3 0 PS 2 (SUTURE) ×3 IMPLANT
SUT VIC AB 0 CT1 27 (SUTURE) ×4
SUT VIC AB 0 CT1 27XBRD ANBCTR (SUTURE) ×2 IMPLANT
TOWEL OR 17X24 6PK STRL BLUE (TOWEL DISPOSABLE) ×3 IMPLANT
TOWEL OR 17X26 10 PK STRL BLUE (TOWEL DISPOSABLE) ×3 IMPLANT
WATER STERILE IRR 1000ML POUR (IV SOLUTION) ×3 IMPLANT

## 2013-08-17 NOTE — Transfer of Care (Signed)
Immediate Anesthesia Transfer of Care Note  Patient: Chris [name]  Procedure(s) Performed: Procedure(s): LEFT TOTAL ANKLE REPLACEMENT WITH POSSIBLE GASTROC RECESSION  (Left)  Patient Location: PACU  Anesthesia Type:General  Level of Consciousness: sedated, patient cooperative and responds to stimulation  Airway & Oxygen Therapy: Patient Spontanous Breathing and Patient connected to face mask oxygen  Post-op Assessment: Report given to PACU RN, Post -op Vital signs reviewed and stable and Patient moving all extremities X 4  Post vital signs: Reviewed and stable  Complications: No apparent anesthesia complications

## 2013-08-17 NOTE — Brief Op Note (Signed)
08/17/2013  4:45 PM  PATIENT:  Chris [name]  73 y.o. male  PRE-OPERATIVE DIAGNOSIS:  LEFT ANKLE ARTHRITIS   POST-OPERATIVE DIAGNOSIS:  LEFT ANKLE ARTHRITIS   Procedure(s):  Left total ankle replacement  SURGEON:  Wylene Simmer, MD  ASSISTANT: Nicki Reaper Flowers, PA-C  ANESTHESIA:   General, regional  EBL:  minimal   TOURNIQUET:   Total Tourniquet Time Documented: Thigh (Left) - 119 minutes Total: Thigh (Left) - 540 minutes  COMPLICATIONS:  None apparent  DISPOSITION:  Extubated, awake and stable to recovery.  DICTATION ID:  [id number]

## 2013-08-17 NOTE — Anesthesia Preprocedure Evaluation (Addendum)
Anesthesia Evaluation  Patient identified by MRN, date of birth, ID band Patient awake    Reviewed: Allergy & Precautions, H&P , NPO status , Patient's Chart, lab work & pertinent test results  Airway Mallampati: III TM Distance: >3 FB Neck ROM: Full    Dental no notable dental hx. (+) Teeth Intact, Dental Advisory Given   Pulmonary neg pulmonary ROS, former smoker,  breath sounds clear to auscultation  Pulmonary exam normal       Cardiovascular hypertension, On Medications + CAD, + Past MI, + CABG and + Peripheral Vascular Disease Rhythm:Regular Rate:Normal     Neuro/Psych TIACVA, Residual Symptoms negative psych ROS   GI/Hepatic Neg liver ROS,   Endo/Other  negative endocrine ROS  Renal/GU negative Renal ROS  negative genitourinary   Musculoskeletal   Abdominal   Peds  Hematology negative hematology ROS (+)   Anesthesia Other Findings   Reproductive/Obstetrics negative OB ROS                          Anesthesia Physical Anesthesia Plan  ASA: III  Anesthesia Plan: General and Regional   Post-op Pain Management:    Induction: Intravenous  Airway Management Planned: LMA  Additional Equipment:   Intra-op Plan:   Post-operative Plan: Extubation in OR  Informed Consent: I have reviewed the patients History and Physical, chart, labs and discussed the procedure including the risks, benefits and alternatives for the proposed anesthesia with the patient or authorized representative who has indicated his/her understanding and acceptance.   Dental advisory given  Plan Discussed with: CRNA  Anesthesia Plan Comments:         Anesthesia Quick Evaluation

## 2013-08-17 NOTE — Anesthesia Procedure Notes (Signed)
Anesthesia Regional Block:  Popliteal block  Pre-Anesthetic Checklist: ,, timeout performed, Correct Patient, Correct Site, Correct Laterality, Correct Procedure, Correct Position, site marked, Risks and benefits discussed, pre-op evaluation, post-op pain management  Laterality: Left  Prep: Maximum Sterile Barrier Precautions used and chloraprep       Needles:  Injection technique: Single-shot  Needle Type: Echogenic Stimulator Needle     Needle Length: 9cm 9 cm Needle Gauge: 21 and 21 G    Additional Needles:  Procedures: ultrasound guided (picture in chart) and nerve stimulator Popliteal block  Nerve Stimulator or Paresthesia:  Response: Peroneal,  Response: Tibial,   Additional Responses:   Narrative:  Start time: 08/17/2013 12:44 PM End time: 08/17/2013 1:00 PM Injection made incrementally with aspirations every 5 mL. Anesthesiologist: Ola Spurr, MD  Additional Notes: 2% Lidocaine skin wheel. Saphenous block with 10cc of 0.5% Bupivicaine plain.

## 2013-08-17 NOTE — Preoperative (Signed)
Beta Blockers   Reason not to administer Beta Blockers:Not Applicable 

## 2013-08-18 ENCOUNTER — Encounter (HOSPITAL_COMMUNITY): Payer: Self-pay | Admitting: General Practice

## 2013-08-18 HISTORY — PX: ANKLE SURGERY: SHX546

## 2013-08-18 LAB — GLUCOSE, CAPILLARY: Glucose-Capillary: 124 mg/dL — ABNORMAL HIGH (ref 70–99)

## 2013-08-18 MED ORDER — OXYCODONE HCL 5 MG PO TABS: 5.0000 mg | ORAL_TABLET | ORAL | Status: DC | PRN

## 2013-08-18 NOTE — Op Note (Signed)
NAMEMarland Kitchen  Chris [name], Chris [name] NO.:  [id number]  MEDICAL RECORD NO.:  [id number]  LOCATION:  5N04C                        FACILITY:  [facility]  PHYSICIAN:  Wylene Simmer, MD        DATE OF BIRTH:  May 16, 1941  DATE OF PROCEDURE:  08/17/2013 DATE OF DISCHARGE:                              OPERATIVE REPORT   PREOPERATIVE DIAGNOSIS:  Left ankle arthritis.  POSTOPERATIVE DIAGNOSIS:  Left ankle arthritis.  PROCEDURE:  Left total ankle replacement.  SURGEON:  Wylene Simmer, MD  ASSISTANT:  Nicki Reaper Flowers, PA-C.  ANESTHESIA:  General, regional.  ESTIMATED BLOOD LOSS:  Minimal.  TOURNIQUET TIME:  1 hour and 59 minutes at 250 mmHg.  COMPLICATIONS:  None apparent.  DISPOSITION:  Extubated, awake and stable to recovery.  INDICATIONS FOR PROCEDURE:  The patient is a 73 year old male with a history of left ankle arthritis.  He underwent arthroscopy with microfracture and debridement of his medial osteochondral lesion.  This was unsuccessful and he continued to having significant pain.  He has failed nonoperative treatment today including activity modification, oral anti-inflammatories, bracing and shoe wear modification.  He also had physical therapy, which was not helpful.  He presents now for operative treatment of this painful limiting condition.  He understands the risks and benefits, the alternative treatment options and elects surgical treatment.  He specifically understands risks of bleeding, infection, nerve damage, blood clots, need for additional surgery, amputation, and death.  PROCEDURE IN DETAIL:  After preoperative consent was obtained and the correct operative site was identified, the patient was brought to the operating room and placed supine on the operating table.  General anesthesia was induced.  Preoperative antibiotics were administered. Surgical time-out was taken.  Left lower extremity was prepped and draped in standard sterile fashion with tourniquet  around the thigh. The extremity was exsanguinated and then tourniquet was inflated to 250 mmHg.  A longitudinal incision was made over the anterior aspect of the ankle.  Sharp dissection was carried down through the skin and subcutaneous tissue.  Extensor retinaculum was incised over the extensor hallucis longus tendon.  The interval between the tibialis anterior and extensor hallucis longus was then developed.  Neurovascular bundle was mobilized and retracted laterally.  It was protected throughout the case.  The anterior ankle joint capsule was elevated sharply medially and laterally exposing the joint.  A stab incision was made at the tibial tubercle.  A 3.2-mm guidepin was inserted parallel to the quarter- inch osteotome and placed in the medial gutter.  The external alignment guide was then applied.  It was aligned in the AP and lateral plane using fluoroscopic imaging.  It was then pinned at the proximal block in unicortical fashion.  Rotation was set with the T-handle parallel to the osteotome in the medial gutter.  The slope was verified on lateral fluoroscopic imaging using the Angel wing.  Resection level was set at 5 mm using the Angel wing.  The distal cutting block was pinned into position.  The medial-lateral cut was then confirmed on an AP fluoroscopic image.  A guidepin was placed in the medial gutter at the most superior aspect of the medial cut.  The distal cutting block was then pinned into position.  The oscillating saw was used to make the distal tibial cut.  The reciprocating saw was used to cut the medial gutter.  The guidepins and the distal cutting block were removed.  The cut bone was removed piecemeal with a curette, rongeur and osteotome. The tongue guide was then fitted to the distal end of the cutting block. It was secured and pinned into position.  This remaining superior articular cartilage was removed from the talus prior to inserting the guide.  The  lateral fluoroscopic view confirmed appropriate position of the talar dome cutting guide.  Guide was then pinned into position.  The cut was made with the oscillating saw and the bone removed along with the cutting guide.  The external alignment guide was then removed.  The cut surface of bone was measured and a size small datum was applied and pinned into position.  Anterior-posterior cutting guide was applied and pinned into position.  The anterior chamfer cut was made followed by the posterior chamfer cut.  The guide was removed and replaced with the medial-lateral cutting guide.  Medial and lateral cuts were made with reciprocating saw.  Guide was removed and all the waste bone was removed.  The window trial was used to confirm the appropriate cuts. The cystic lesion of the talar dome was curetted and packed with bone graft after irrigating copiously.  The keel hole was drilled and then broached.  A size small talar component was then impacted into position. The tibial plafond was measured and a size large trial was selected.  It was pinned into position and appropriate size was confirmed on AP and lateral radiographs.  The trial was pinned into position and the two barrel holes were drilled and punched.  The guide was removed.  The size 6-mm trial was trialed while the distal tibial component was in place. It was noted to fit appropriately.  The trial components were removed. The wound was again irrigated copiously.  A size large tibial base plate was then inserted and impacted into position.  A 7-mm trial was inserted and was noted to fit appropriately.  The ankle had about 15 degrees of dorsiflexion and 40 degrees of plantar flexion.  The wound was again irrigated copiously after removing the trial.  A final 7-mm polyethylene spacer was then inserted.  The wound was again irrigated.  The anterior ankle joint capsule was repaired with 0 Vicryl simple sutures.  The retinaculum was  repaired with simple sutures of 0 Vicryl.  Subcutaneous tissue was approximated with inverted simple sutures of 3-0 Monocryl and a running 3-0 nylon was used to close the skin incision.  The proximal guidepin was removed and that incision closed with horizontal mattress suture of 3-0 nylon.  Sterile dressings were applied followed by well- padded short-leg cast.  The patient was awakened from anesthesia and transported to the recovery room in stable condition.  FOLLOWUP PLAN:  The patient will be admitted for this inpatient only procedure.  He will have physical therapy tomorrow.  He will start DVT prophylaxis tomorrow.  He will follow up with me in clinic in 3 weeks for suture removal.  Scott Flowers, PA-C was present and scrubbed for the duration of the case.  His assistance was essential in gaining and maintaining exposure, performing the operation, closing and dressing the wounds, and applying the cast.     Wylene Simmer, MD     JH/MEDQ  D:  08/17/2013  T:  08/18/2013  Job:  [id number]

## 2013-08-18 NOTE — Progress Notes (Signed)
Subjective: 1 Day Post-Op Procedure(s) (LRB): LEFT TOTAL ANKLE REPLACEMENT WITH POSSIBLE GASTROC RECESSION  (Left) Patient doing well this AM, pain well controlled. Pt denies N/V/F/C, chest pain, SOB, calf pain or changes in appetite.  Objective: Vital signs in last 24 hours: Temp:  [97.4 F (36.3 C)-98.2 F (36.8 C)] 98.2 F (36.8 C) (03/27 0706) Pulse Rate:  [53-74] 69 (03/27 0706) Resp:  [10-20] 18 (03/27 0706) BP: (109-153)/(58-88) 109/69 mmHg (03/27 0706) SpO2:  [92 %-99 %] 94 % (03/27 0706)  Intake/Output from previous day: 03/26 0701 - 03/27 0700 In: 1100 [P.O.:100; I.V.:1000] Out: 700 [Urine:650; Blood:50] Intake/Output this shift:    No results found for this basename: HGB,  in the last 72 hours No results found for this basename: WBC, RBC, HCT, PLT,  in the last 72 hours No results found for this basename: NA, K, CL, CO2, BUN, CREATININE, GLUCOSE, CALCIUM,  in the last 72 hours No results found for this basename: LABPT, INR,  in the last 72 hours  Chris [name] 73 y/o male in NAD, A/Ox3, appears stated age. EOMI, mood and affect normal, respirations unlabored.  On physical exam cast c/d/i, well placed and in good repair.  Toes mobile, well perfused with cap refill <2sec. Decreased sensation to light touch noted.   Assessment/Plan: 1 Day Post-Op Procedure(s) (LRB): LEFT TOTAL ANKLE REPLACEMENT WITH POSSIBLE GASTROC RECESSION  (Left) PT/OT today Discharge home F/u in 2 weeks with Dr. Doran Chris [name] Continue Aspirin 325mg  everyday for 6 weeks  Zyir [name] 08/18/2013, 7:30 AM

## 2013-08-18 NOTE — Progress Notes (Signed)
Orthopedic Tech Progress Note Patient Details:  Chris [name] February 01, 1941 [id number]  Ortho Devices Type of Ortho Device: Crutches Ortho Device/Splint Interventions: Application   [name] 08/18/2013, 3:09 PM

## 2013-08-18 NOTE — Discharge Summary (Signed)
Physician Discharge Summary  Patient ID: Chris [name] MRN: [id number] DOB/AGE: 1940-12-25 73 y.o.  Admit date: 08/17/2013 Discharge date: 08/18/2013  Admission Diagnoses: Left ankle arthritis  Discharge Diagnoses: same Active Problems:   Arthritis of left ankle   Discharged Condition: good  Hospital Course: On 08/17/2013 pt brought to [facility] for left total ankle arthroplasty. Pt understood risks and benefits of surgical correction of left ankle arthritis and elected to proceed with surgery.  Procedure performed by Dr. Wylene Simmer without complication and pt was recovered in PACU and then transferred to the floor for further post operative care. While admitted pt underwent physical/occuaptional therapy for home therapy evaluation as well as received Lovenox 40mg  subq for anticoagulation. On 08/18/2013 pt was afebrile, all vital signs were stable and pt was appropriate for discharge.  Prognosis for the pt is good and he will f/u with Dr. Doran Durand in 2 weeks.  Consults: None  Significant Diagnostic Studies: none  Treatments: surgery: as stated above  Discharge Exam: Blood pressure 109/69, pulse 69, temperature 98.2 F (36.8 C), temperature source Oral, resp. rate 18, SpO2 94.00%. WD WN 73 y/o male in NAD, A/Ox3, appears stated age. EOMI, mood and affect normal, respirations unlabored.  On physical exam cast c/d/i, well placed and in good repair.  Toes mobile, well perfused with cap refill <2sec. Decreased sensation to light touch noted.   Disposition: 01-Home or Self Care  Discharge Orders   Future Orders Complete By Expires   Call MD / Call 911  As directed    Comments:     If you experience chest pain or shortness of breath, CALL 911 and be transported to the hospital emergency room.  If you develope a fever above 101 F, pus (white drainage) or increased drainage or redness at the wound, or calf pain, call your surgeon's office.   Constipation Prevention  As directed    Comments:     Drink plenty of fluids.  Prune juice may be helpful.  You may use a stool softener, such as Colace (over the counter) 100 mg twice a day.  Use MiraLax (over the counter) for constipation as needed.   Diet - low sodium heart healthy  As directed    Driving restrictions  As directed    Comments:     No driving for 6 weeks   Increase activity slowly as tolerated  As directed    Lifting restrictions  As directed    Comments:     No lifting for 6 weeks       Medication List         acetaminophen 500 MG tablet  Commonly known as:  TYLENOL  Take 500 mg by mouth every 6 (six) hours as needed.     aspirin EC 81 MG tablet  Take 1 tablet (81 mg total) by mouth daily.     losartan 25 MG tablet  Commonly known as:  COZAAR  Take 25 mg by mouth daily.     oxyCODONE 5 MG immediate release tablet  Commonly known as:  Oxy IR/ROXICODONE  Take 1-2 tablets (5-10 mg total) by mouth every 3 (three) hours as needed for breakthrough pain.     rosuvastatin 20 MG tablet  Commonly known as:  CRESTOR  Take 20 mg by mouth daily after breakfast.     CRESTOR 20 MG tablet  Generic drug:  rosuvastatin  TAKE 1 TABLET BY MOUTH EVERY DAY  Follow-up Information   Follow up with HEWITT, Jenny Reichmann, MD. Schedule an appointment as soon as possible for a visit in 2 weeks. (For suture removal)    Specialty:  Orthopedic Surgery   Contact information:   [address] [place] [address] [phone]       Signed: Trula Ore 08/18/2013, 7:38 AM

## 2013-08-18 NOTE — Evaluation (Signed)
Physical Therapy Evaluation Patient Details Name: Chris [name] MRN: [id number] DOB: 03-Apr-1941 Today's Date: 08/18/2013   History of Present Illness  Pt s/p L Total Ankle Arthoplasty  Clinical Impression  This patient presents with acute pain and decreased functional independence following the above mentioned procedure. At the time of PT eval, pt demonstrated the ability to perform functional mobility with min guard or occasional min assist. Pt reminded of safety awareness frequently throughout session and is somewhat impulsive. This patient is appropriate for skilled PT interventions to address functional limitations, improve safety and independence with functional mobility, and return to PLOF.     Follow Up Recommendations No PT follow up    Equipment Recommendations  Rolling walker with 5" wheels;Crutches;3in1 (PT)    Recommendations for Other Services       Precautions / Restrictions Precautions Precautions: Fall Restrictions Weight Bearing Restrictions: Yes LLE Weight Bearing: Non weight bearing      Mobility  Bed Mobility               General bed mobility comments: Pt received sitting up in chair  Transfers Overall transfer level: Needs assistance Equipment used: Rolling walker (2 wheeled) Transfers: Sit to/from [facility] Sit to Stand: Min guard Stand pivot transfers: Min guard;Min assist       General transfer comment: VC's for hand placement on seated surface for safety. Pt with occasional min assist required due to impulsive stand>sit, to prevent fall.   Ambulation/Gait Ambulation/Gait assistance: Min guard Ambulation Distance (Feet): 50 Feet Assistive device: Rolling walker (2 wheeled) (Knee-walker) Gait Pattern/deviations:  (Hop-to) Gait velocity: Decreased Gait velocity interpretation: Below normal speed for age/gender General Gait Details: Frequent VC's to maintain NWB status on operative LE. Pt did well with the knee-scooter  however states he feels uncomfortable on it and would rather use the crutches. No appropriate sized crutches (pair) available to practice ambulation with, however pt did well with RW also.   Stairs Stairs: Yes Stairs assistance: Min guard Stair Management: One rail Left;With crutches;Forwards Number of Stairs: 5 (Up 2 steps, down 3 steps) General stair comments: Pt unable to complete stair training with walker due to decreased UE strength. Pt did better with LUE on railing and single crutch on R side. Wife present for family education.   Wheelchair Mobility    Modified Rankin (Stroke Patients Only)       Balance Overall balance assessment: Needs assistance Sitting-balance support: Feet supported Sitting balance-Leahy Scale: Fair     Standing balance support: Bilateral upper extremity supported Standing balance-Leahy Scale: Poor                       Pertinent Vitals/Pain Pt reports that his pain has been well managed today.     Home Living Family/patient expects to be discharged to:: Private residence Living Arrangements: Spouse/significant other Available Help at Discharge: Family;Available 24 hours/day Type of Home: House Home Access: Stairs to enter Entrance Stairs-Rails: Left (to enter - large steps with railing in the middle) Entrance Stairs-Number of Steps: 2 (off deck) Home Layout: Two level;Able to live on main level with bedroom/bathroom Home Equipment: Grab bars - toilet      Prior Function Level of Independence: Independent               Hand Dominance   Dominant Hand: Right    Extremity/Trunk Assessment   Upper Extremity Assessment: Defer to OT evaluation  Lower Extremity Assessment: LLE deficits/detail   LLE Deficits / Details: Decreased strength and AROM consistent with total ankle arthroplasty  Cervical / Trunk Assessment: Normal  Communication   Communication: No difficulties  Cognition Arousal/Alertness:  Awake/alert Behavior During Therapy: WFL for tasks assessed/performed Overall Cognitive Status: Within Functional Limits for tasks assessed                      General Comments      Exercises        Assessment/Plan    PT Assessment Patient needs continued PT services  PT Diagnosis Difficulty walking;Acute pain   PT Problem List Decreased strength;Decreased range of motion;Decreased activity tolerance;Decreased balance;Decreased mobility;Decreased knowledge of use of DME;Decreased knowledge of precautions;Decreased safety awareness;Pain  PT Treatment Interventions DME instruction;Gait training;Stair training;Functional mobility training;Therapeutic activities;Therapeutic exercise;Neuromuscular re-education;Patient/family education   PT Goals (Current goals can be found in the Care Plan section) Acute Rehab PT Goals Patient Stated Goal: To drive again PT Goal Formulation: With patient/family Time For Goal Achievement: 08/25/13 Potential to Achieve Goals: Good    Frequency Min 5X/week   Barriers to discharge        End of Session Equipment Utilized During Treatment: Gait belt Activity Tolerance: Patient tolerated treatment well Patient left: in chair;with call bell/phone within reach;with family/visitor present         Time: 1330-1403 PT Time Calculation (min): 33 min   Charges:   PT Evaluation $Initial PT Evaluation Tier I: 1 Procedure PT Treatments $Gait Training: 8-22 mins $Therapeutic Activity: 8-22 mins   PT G CodesJolyn Lent 08/18/2013, 3:24 PM  Jolyn Lent, PT, DPT Acute Rehabilitation Services Pager: [phone]

## 2013-08-18 NOTE — Progress Notes (Signed)
Occupational Therapy Evaluation and Discharge Patient Details Name: Chris [name] MRN: [id number] DOB: 10/21/1940 Today's Date: 08/18/2013    History of Present Illness Pt s/p L Total Ankle Arthoplasty   Clinical Impression   PTA pt lived at home with wife and was independent in ADLs and mobility. Pt with history of L ankle problems. Per pt, MD orders NWB on LLE and pt presents with cast on L foot/ankle. Education and training provided regarding LB ADLs and WB precautions. Pt was overall min guard for transfers and functional mobility for ADLs. Pt reports that wife will be available 24/7 to assist with ADLs as needed. Educated pt on safety with tub transfer and LB bathing with NWB status and pt reports that he will sponge bathe until WB precaution is resolved and cast removed. No further acute OT needs at this time.     Follow Up Recommendations  No OT follow up;Supervision/Assistance - 24 hour    Equipment Recommendations  None recommended by OT       Precautions / Restrictions Restrictions Weight Bearing Restrictions: Yes Other Position/Activity Restrictions: NWB on L foot per pt; no order in chart by MD      Mobility                  Transfers Overall transfer level: Needs assistance Equipment used: Rolling walker (2 wheeled) Transfers: Sit to/from Stand Sit to Stand: Min guard         General transfer comment: VC's for hand placement and positioning of walker closer to recliner prior to sitting. Pt required VC's for WB status during sit<>stand transfers, however pushing through arms on RW well during ambulation.         ADL Eating/Feeding: Independent;Sitting Grooming: Set up;Standing (with RW at sink)   Upper Body Dressing : Set up;Standing Lower Body Bathing: Min guard;Sit to/from stand Lower Body Dressing: Min guard;Sit to/from stand Toilet Transfer: Min guard;Comfort height toilet;RW;Grab bars (VC's for hand placement and NWB on LLE) Toileting-  Water quality scientist and Hygiene: Min guard;Sit to/from stand   Functional mobility during ADLs: Min guard;Rolling walker General ADL Comments: Pt plans to take sponge baths until cast is removed and able to WB on LLE. Discussed safe tub transfer. Pt overall min guard for transfers and functional mobility during ADLs.               Pertinent Vitals/Pain 8/10; RN notified and administered pain medication     Hand Dominance Right   Extremity/Trunk Assessment Upper Extremity Assessment Upper Extremity Assessment: Overall WFL for tasks assessed   Lower Extremity Assessment Lower Extremity Assessment: Defer to PT evaluation   Cervical / Trunk Assessment Cervical / Trunk Assessment: Normal   Communication Communication Communication: No difficulties   Cognition Arousal/Alertness: Awake/alert Behavior During Therapy: WFL for tasks assessed/performed Overall Cognitive Status: Within Functional Limits for tasks assessed                             Home Living Family/patient expects to be discharged to:: Private residence Living Arrangements: Spouse/significant other Available Help at Discharge: Family;Available 24 hours/day Type of Home: House Home Access: Stairs to enter [employer] of Steps: 2 (off deck) Entrance Stairs-Rails: Right;Left (pt reports he built a wide stair off deck with railing in mi) Home Layout: Able to live on main level with bedroom/bathroom     Bathroom Shower/Tub: Tub/shower unit;Door Shower/tub characteristics: Door Bathroom Toilet: Handicapped height  Home Equipment: Grab bars - toilet          Prior Functioning/Environment Level of Independence: Independent                                    End of Session: Equipment Utilized During Treatment: Rolling walker  Activity Tolerance: Patient tolerated treatment well Patient left: in chair;with call bell/phone within reach   Time: 0900-0943 OT Time  Calculation (min): 43 min Charges:  OT General Charges $OT Visit: 1 Procedure OT Evaluation $Initial OT Evaluation Tier I: 1 Procedure OT Treatments $Self Care/Home Management : 23-37 mins  Juluis Rainier [phone] 08/18/2013, 10:01 AM

## 2013-08-18 NOTE — Discharge Instructions (Signed)
Chris Hewitt, MD °[facility] ° °Please read the following information regarding your care after surgery. ° °Medications  °You only need a prescription for the narcotic pain medicine (ex. oxycodone, Percocet, Norco).  All of the other medicines listed below are available over the counter. °X acetominophen (Tylenol) 650 mg every 4-6 hours as you need for minor pain °X oxycodone as prescribed for moderate to severe pain  ° °Narcotic pain medicine (ex. oxycodone, Percocet, Vicodin) will cause constipation.  To prevent this problem, take the following medicines while you are taking any pain medicine. °X docusate sodium (Colace) 100 mg twice a day X senna (Senokot) 2 tablets twice a day ° °X To help prevent blood clots, take an aspirin (325 mg) once a day for a month after surgery.  You should also get up every hour while you are awake to move around.   ° °Weight Bearing °X Do not bear any weight on the operated leg or foot. ° °Cast / Splint / Dressing °X Keep your splint or cast clean and dry.  Don’t put anything (coat hanger, pencil, etc) down inside of it.  If it gets damp, use a hair dryer on the cool setting to dry it.  If it gets soaked, call the office to schedule an appointment for a cast change. ° °Swelling °It is normal for you to have swelling where you had surgery.  To reduce swelling and pain, keep your toes above your nose for at least 3 days after surgery.  It may be necessary to keep your foot or leg elevated for several weeks.  If it hurts, it should be elevated. ° °Follow Up °Call my office at [phone] when you are discharged from the hospital or surgery center to schedule an appointment to be seen two weeks after surgery. ° °Call my office at [phone] if you develop a fever >101.5° F, nausea, vomiting, bleeding from the surgical site or severe pain.   ° ° ° °

## 2013-08-18 NOTE — Care Management Note (Addendum)
CARE MANAGEMENT NOTE 08/18/2013  Patient:  Chris [name], Chris [name]   Account Number:  [id number]  Date Initiated:  08/18/2013  Documentation initiated by:  Ricki Miller  Subjective/Objective Assessment:   73 yr old male admitted with left ankle arthritis, s/p left ankle replacement.     Action/Plan:   Case manager spoke with patient concerning home health and DME needs at discharge. No HH needs identified. DME to be ordered. Patient has family support at discharge.   Anticipated DC Date:  08/18/2013   Anticipated DC Plan:  [place]  CM consult      PAC Choice  DURABLE MEDICAL EQUIPMENT   Choice offered to / List presented to:  C-1 Patient   DME arranged  [place]     [place]  DME agency  [place] arranged  NA      Status of service:  Completed, signed off Medicare Important Message given?   (If response is "NO", the following Medicare IM given date fields will be blank) Date Medicare IM given:   Date Additional Medicare IM given:    Discharge Disposition:  HOME/SELF CARE

## 2013-08-29 NOTE — Anesthesia Postprocedure Evaluation (Signed)
  Anesthesia Post-op Note  Patient: Chris [name]  Procedure(s) Performed: Procedure(s): LEFT TOTAL ANKLE REPLACEMENT WITH POSSIBLE GASTROC RECESSION  (Left)  Patient Location: PACU  Anesthesia Type:General  Level of Consciousness: awake and alert   Airway and Oxygen Therapy: Patient Spontanous Breathing  Post-op Pain: mild  Post-op Assessment: Post-op Vital signs reviewed  Post-op Vital Signs: stable  Complications: No apparent anesthesia complications

## 2013-09-10 ENCOUNTER — Other Ambulatory Visit: Payer: Self-pay | Admitting: Family Medicine

## 2013-09-24 ENCOUNTER — Other Ambulatory Visit: Payer: Self-pay | Admitting: Cardiology

## 2013-11-14 ENCOUNTER — Ambulatory Visit (INDEPENDENT_AMBULATORY_CARE_PROVIDER_SITE_OTHER): Payer: Medicare Other | Admitting: Family Medicine

## 2013-11-14 ENCOUNTER — Encounter: Payer: Self-pay | Admitting: Family Medicine

## 2013-11-14 VITALS — BP 132/72 | HR 68 | Temp 98.2°F | Resp 20 | Ht 71.5 in | Wt 251.0 lb

## 2013-11-14 DIAGNOSIS — I2510 Atherosclerotic heart disease of native coronary artery without angina pectoris: Secondary | ICD-10-CM | POA: Diagnosis not present

## 2013-11-14 DIAGNOSIS — E785 Hyperlipidemia, unspecified: Secondary | ICD-10-CM | POA: Diagnosis not present

## 2013-11-14 DIAGNOSIS — I10 Essential (primary) hypertension: Secondary | ICD-10-CM

## 2013-11-14 NOTE — Progress Notes (Signed)
Subjective:    Patient ID: Chris [name], male    DOB: 1940-07-14, 73 y.o.   MRN: [id number]  HPI Patient is here today for followup. He has history of coronary artery disease, hypertension, hyperlipidemia. He is currently on Crestor 20 mg by mouth daily. He is overdue for a CMP and fasting lipid panel. Due to his history of coronary artery disease, his goal LDL is less than 70.  His blood pressure is well-controlled today 132/72. He states his blood pressure home ranges 130s over 70s. He denies any chest pain, shortness of breath, dyspnea on exertion.  Patient is compliant taking 325 mg aspirin. Past Medical History  Diagnosis Date  . Coronary artery disease     a. 1995 s/p CABG x 3 (VG->RCA, LIMA->LAD, VG->OM);  b. 07/2008 Inf MI/Cath/PCI: VG->RCA 99 - treated with Taxus DES (73mm), LIMA and VG->OM patent, LAD 100, LCX 100, RCA 60d, EF 50%;  c. 09/2008 PCI native RCA  w/ 2.5x23 Xience DES, VG->RCA stent patent;  c. 05/2010 Cath: Native 3VD with 3/3 patent grafts, native RCA w 80% ISR prox to graft insertion-->Med Rx.  . RBBB (right bundle branch block)   . Gout   . Hyperlipidemia   . Obesity   . Hypertension   . ED (erectile dysfunction)   . Vertigo   . Umbilical hernia     a. s/p repair.  . 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 Myocardial infarction   . GERD (gastroesophageal reflux disease)     occ  . Arthritis   . Neuropathy     toes  . H/O hiatal hernia    Current Outpatient Prescriptions on File Prior to Visit  Medication Sig Dispense Refill  . acetaminophen (TYLENOL) 500 MG tablet Take 500 mg by mouth every 6 (six) hours as needed.      Marland Kitchen losartan (COZAAR) 25 MG tablet TAKE 1 TABLET BY MOUTH EVERY DAY  90 tablet  0  . rosuvastatin (CRESTOR) 20 MG tablet Take 20 mg by mouth daily after breakfast.        No current facility-administered medications on file prior to visit.   No Known Allergies History   Social History  . Marital Status: Married      Spouse Name: N/A    Number of Children: N/A  . Years of Education: N/A   Occupational History  . Not on file.   Social History Main Topics  . Smoking status: Former Smoker -- 1.00 packs/day for 9 years    Types: Cigarettes    Quit date: 10/31/1970  . Smokeless tobacco: Former Systems developer    Types: Chew     Comment: chewed for 2 years  . Alcohol Use: No     Comment: occ wine last 6 months  . Drug Use: No  . Sexual Activity: Yes   Other Topics Concern  . Not on file   Social History Narrative   Lives in [place] with wife.  Retired.      Review of Systems  All other systems reviewed and are negative.      Objective:   Physical Exam  Vitals reviewed. Constitutional: He appears well-developed and well-nourished.  Neck: Neck supple. No JVD present. No thyromegaly present.  Cardiovascular: Normal rate, regular rhythm and intact distal pulses.  Exam reveals no gallop and no friction rub.   No murmur heard. Pulmonary/Chest: Effort normal and breath sounds normal. No respiratory distress. He has no wheezes.  He has no rales. He exhibits no tenderness.  Abdominal: Soft. Bowel sounds are normal. He exhibits no distension and no mass. There is no tenderness. There is no rebound and no guarding.  Musculoskeletal: He exhibits no edema.  Lymphadenopathy:    He has no cervical adenopathy.          Assessment & Plan:  Essential hypertension - Plan: COMPLETE METABOLIC PANEL WITH GFR  HLD (hyperlipidemia) - Plan: COMPLETE METABOLIC PANEL WITH GFR, Lipid panel  Coronary artery disease involving native coronary artery of native heart without angina pectoris - Plan: COMPLETE METABOLIC PANEL WITH GFR, CBC with Differential, Lipid panel  Patient's blood pressures well controlled today. I've asked him to return fasting for a CMP and fasting lipid panel. His goal LDL would be less than 70. The patient can no longer afford Crestor. Therefore if his LDL is at goal, however switch the  patient to Lipitor 40 mg by mouth daily. If the patient's LDL is greater than his goal of 70, I start the patient on Lipitor 80 mg a day. Patient also has a 50-69% stenosis in the right carotid artery. He is due for repeat carotid ultrasound in September of 2015.

## 2013-11-15 ENCOUNTER — Other Ambulatory Visit: Payer: Self-pay | Admitting: Family Medicine

## 2013-11-15 ENCOUNTER — Other Ambulatory Visit: Payer: Medicare Other

## 2013-11-15 LAB — COMPLETE METABOLIC PANEL WITH GFR
ALT: 20 U/L (ref 0–53)
AST: 25 U/L (ref 0–37)
Albumin: 3.8 g/dL (ref 3.5–5.2)
Alkaline Phosphatase: 50 U/L (ref 39–117)
BUN: 15 mg/dL (ref 6–23)
CO2: 23 mEq/L (ref 19–32)
Calcium: 9.2 mg/dL (ref 8.4–10.5)
Chloride: 105 mEq/L (ref 96–112)
Creat: 1.19 mg/dL (ref 0.50–1.35)
GFR, Est African American: 71 mL/min
GFR, Est Non African American: 62 mL/min
Glucose, Bld: 131 mg/dL — ABNORMAL HIGH (ref 70–99)
Potassium: 4.5 mEq/L (ref 3.5–5.3)
Sodium: 140 mEq/L (ref 135–145)
Total Bilirubin: 0.4 mg/dL (ref 0.2–1.2)
Total Protein: 7.5 g/dL (ref 6.0–8.3)

## 2013-11-15 LAB — CBC WITH DIFFERENTIAL/PLATELET
Basophils Absolute: 0.1 10*3/uL (ref 0.0–0.1)
Basophils Relative: 1 % (ref 0–1)
Eosinophils Absolute: 0.3 10*3/uL (ref 0.0–0.7)
Eosinophils Relative: 5 % (ref 0–5)
HCT: 42 % (ref 39.0–52.0)
Hemoglobin: 14 g/dL (ref 13.0–17.0)
Lymphocytes Relative: 41 % (ref 12–46)
Lymphs Abs: 2.4 10*3/uL (ref 0.7–4.0)
MCH: 31.6 pg (ref 26.0–34.0)
MCHC: 33.3 g/dL (ref 30.0–36.0)
MCV: 94.8 fL (ref 78.0–100.0)
Monocytes Absolute: 0.5 10*3/uL (ref 0.1–1.0)
Monocytes Relative: 8 % (ref 3–12)
Neutro Abs: 2.7 10*3/uL (ref 1.7–7.7)
Neutrophils Relative %: 45 % (ref 43–77)
Platelets: 168 10*3/uL (ref 150–400)
RBC: 4.43 MIL/uL (ref 4.22–5.81)
RDW: 14.5 % (ref 11.5–15.5)
WBC: 5.9 10*3/uL (ref 4.0–10.5)

## 2013-11-15 LAB — LIPID PANEL
Cholesterol: 194 mg/dL (ref 0–200)
HDL: 38 mg/dL — ABNORMAL LOW (ref >39)
LDL Cholesterol: 78 mg/dL (ref 0–99)
Total CHOL/HDL Ratio: 5.1 Ratio
Triglycerides: 391 mg/dL — ABNORMAL HIGH (ref <150)
VLDL: 78 mg/dL — ABNORMAL HIGH (ref 0–40)

## 2013-11-16 LAB — HEMOGLOBIN A1C
Hgb A1c MFr Bld: 6.6 % — ABNORMAL HIGH (ref <5.7)
Mean Plasma Glucose: 143 mg/dL — ABNORMAL HIGH (ref <117)

## 2013-11-17 ENCOUNTER — Encounter: Payer: Self-pay | Admitting: Family Medicine

## 2013-11-17 DIAGNOSIS — E119 Type 2 diabetes mellitus without complications: Secondary | ICD-10-CM | POA: Insufficient documentation

## 2013-11-20 ENCOUNTER — Other Ambulatory Visit: Payer: Self-pay | Admitting: Family Medicine

## 2013-11-20 DIAGNOSIS — R739 Hyperglycemia, unspecified: Secondary | ICD-10-CM

## 2013-11-20 DIAGNOSIS — E785 Hyperlipidemia, unspecified: Secondary | ICD-10-CM

## 2013-11-20 DIAGNOSIS — Z79899 Other long term (current) drug therapy: Secondary | ICD-10-CM

## 2013-11-20 DIAGNOSIS — I10 Essential (primary) hypertension: Secondary | ICD-10-CM

## 2013-11-20 MED ORDER — ATORVASTATIN CALCIUM 80 MG PO TABS: 80.0000 mg | ORAL_TABLET | Freq: Every day | ORAL | Status: DC

## 2013-12-08 ENCOUNTER — Telehealth: Payer: Self-pay | Admitting: Family Medicine

## 2013-12-08 NOTE — Telephone Encounter (Signed)
Message copied by Alyson Locket on Fri Dec 08, 2013 10:50 AM ------      Message from: Chris [name]      Created: Thu Dec 07, 2013  2:51 PM       [phone]      Patient is calling to speak with you about his Lipitor he said it is knocking him out   ------

## 2013-12-08 NOTE — Telephone Encounter (Signed)
[facility] -  [phone] phone number below is incorrect

## 2013-12-12 NOTE — Telephone Encounter (Signed)
Pt states that he is unable to tolerate the Lipitor due to severe muscle aches and has restarted his crestor 20mg  qd. He will discuss this with you at his next office visit in a couple of months.

## 2013-12-14 NOTE — Telephone Encounter (Signed)
ok 

## 2014-02-15 ENCOUNTER — Ambulatory Visit (HOSPITAL_COMMUNITY): Admission: RE | Admit: 2014-02-15 | Payer: Medicare Other | Source: Ambulatory Visit | Admitting: Cardiology

## 2014-02-15 ENCOUNTER — Inpatient Hospital Stay (HOSPITAL_COMMUNITY)
Admission: EM | Admit: 2014-02-15 | Discharge: 2014-02-16 | DRG: 281 | Disposition: A | Discharge status: Home Health | Payer: Medicare Other | Source: Other Acute Inpatient Hospital | Attending: Cardiology | Admitting: Cardiology

## 2014-02-15 ENCOUNTER — Encounter (HOSPITAL_COMMUNITY)
Admission: EM | Disposition: A | Discharge status: Home Health | Payer: Self-pay | Source: Other Acute Inpatient Hospital | Attending: Cardiology

## 2014-02-15 DIAGNOSIS — I6529 Occlusion and stenosis of unspecified carotid artery: Secondary | ICD-10-CM | POA: Diagnosis present

## 2014-02-15 DIAGNOSIS — I252 Old myocardial infarction: Secondary | ICD-10-CM

## 2014-02-15 DIAGNOSIS — Z9861 Coronary angioplasty status: Secondary | ICD-10-CM | POA: Diagnosis not present

## 2014-02-15 DIAGNOSIS — E669 Obesity, unspecified: Secondary | ICD-10-CM | POA: Diagnosis present

## 2014-02-15 DIAGNOSIS — M109 Gout, unspecified: Secondary | ICD-10-CM | POA: Diagnosis present

## 2014-02-15 DIAGNOSIS — Z888 Allergy status to other drugs, medicaments and biological substances status: Secondary | ICD-10-CM

## 2014-02-15 DIAGNOSIS — I214 Non-ST elevation (NSTEMI) myocardial infarction: Secondary | ICD-10-CM

## 2014-02-15 DIAGNOSIS — I2510 Atherosclerotic heart disease of native coronary artery without angina pectoris: Secondary | ICD-10-CM | POA: Diagnosis present

## 2014-02-15 DIAGNOSIS — M19072 Primary osteoarthritis, left ankle and foot: Secondary | ICD-10-CM

## 2014-02-15 DIAGNOSIS — I10 Essential (primary) hypertension: Secondary | ICD-10-CM | POA: Diagnosis present

## 2014-02-15 DIAGNOSIS — Z8249 Family history of ischemic heart disease and other diseases of the circulatory system: Secondary | ICD-10-CM

## 2014-02-15 DIAGNOSIS — Z79899 Other long term (current) drug therapy: Secondary | ICD-10-CM | POA: Diagnosis not present

## 2014-02-15 DIAGNOSIS — I200 Unstable angina: Secondary | ICD-10-CM

## 2014-02-15 DIAGNOSIS — Z87891 Personal history of nicotine dependence: Secondary | ICD-10-CM

## 2014-02-15 DIAGNOSIS — E782 Mixed hyperlipidemia: Secondary | ICD-10-CM | POA: Diagnosis present

## 2014-02-15 DIAGNOSIS — Z7902 Long term (current) use of antithrombotics/antiplatelets: Secondary | ICD-10-CM | POA: Diagnosis not present

## 2014-02-15 DIAGNOSIS — R079 Chest pain, unspecified: Secondary | ICD-10-CM | POA: Diagnosis present

## 2014-02-15 DIAGNOSIS — I25810 Atherosclerosis of coronary artery bypass graft(s) without angina pectoris: Secondary | ICD-10-CM | POA: Diagnosis present

## 2014-02-15 HISTORY — PX: CARDIAC CATHETERIZATION: SHX172

## 2014-02-15 HISTORY — DX: Non-ST elevation (NSTEMI) myocardial infarction: I21.4

## 2014-02-15 LAB — LIPID PANEL
Cholesterol: 153 mg/dL (ref 0–200)
HDL: 33 mg/dL — ABNORMAL LOW (ref >39)
LDL Cholesterol: 65 mg/dL (ref 0–99)
Total CHOL/HDL Ratio: 4.6 RATIO
Triglycerides: 275 mg/dL — ABNORMAL HIGH (ref <150)
VLDL: 55 mg/dL — ABNORMAL HIGH (ref 0–40)

## 2014-02-15 LAB — COMPREHENSIVE METABOLIC PANEL
ALT: 32 U/L (ref 0–53)
AST: 41 U/L — ABNORMAL HIGH (ref 0–37)
Albumin: 3.4 g/dL — ABNORMAL LOW (ref 3.5–5.2)
Alkaline Phosphatase: 57 U/L (ref 39–117)
Anion gap: 15 (ref 5–15)
BUN: 19 mg/dL (ref 6–23)
CO2: 22 mEq/L (ref 19–32)
Calcium: 8.9 mg/dL (ref 8.4–10.5)
Chloride: 104 mEq/L (ref 96–112)
Creatinine, Ser: 1.1 mg/dL (ref 0.50–1.35)
GFR calc Af Amer: 77 mL/min — ABNORMAL LOW (ref >90)
GFR calc non Af Amer: 66 mL/min — ABNORMAL LOW (ref >90)
Glucose, Bld: 144 mg/dL — ABNORMAL HIGH (ref 70–99)
Potassium: 4.7 mEq/L (ref 3.7–5.3)
Sodium: 141 mEq/L (ref 137–147)
Total Bilirubin: 0.5 mg/dL (ref 0.3–1.2)
Total Protein: 7.4 g/dL (ref 6.0–8.3)

## 2014-02-15 LAB — TROPONIN I: Troponin I: <0.3 ng/mL (ref <0.30)

## 2014-02-15 LAB — POCT I-STAT, CHEM 8
BUN: 19 mg/dL (ref 6–23)
Calcium, Ion: 1.25 mmol/L (ref 1.13–1.30)
Chloride: 106 mEq/L (ref 96–112)
Creatinine, Ser: 1.1 mg/dL (ref 0.50–1.35)
Glucose, Bld: 108 mg/dL — ABNORMAL HIGH (ref 70–99)
HCT: 44 % (ref 39.0–52.0)
Hemoglobin: 15 g/dL (ref 13.0–17.0)
Potassium: 4.2 mEq/L (ref 3.7–5.3)
Sodium: 140 mEq/L (ref 137–147)
TCO2: 24 mmol/L (ref 0–100)

## 2014-02-15 LAB — CBC
HCT: 42.1 % (ref 39.0–52.0)
Hemoglobin: 14 g/dL (ref 13.0–17.0)
MCH: 31.6 pg (ref 26.0–34.0)
MCHC: 33.3 g/dL (ref 30.0–36.0)
MCV: 95 fL (ref 78.0–100.0)
Platelets: 194 10*3/uL (ref 150–400)
RBC: 4.43 MIL/uL (ref 4.22–5.81)
RDW: 13.1 % (ref 11.5–15.5)
WBC: 5.8 10*3/uL (ref 4.0–10.5)

## 2014-02-15 LAB — POCT ACTIVATED CLOTTING TIME: Activated Clotting Time: 107 seconds

## 2014-02-15 LAB — TSH: TSH: 2 u[IU]/mL (ref 0.350–4.500)

## 2014-02-15 SURGERY — LEFT HEART CATH AND CORS/GRAFTS ANGIOGRAPHY

## 2014-02-15 MED ORDER — ONDANSETRON HCL 4 MG/2ML IJ SOLN
4.0000 mg | Freq: Four times a day (QID) | INTRAMUSCULAR | Status: DC | PRN
Start: 2014-02-15 — End: 2014-02-16

## 2014-02-15 MED ORDER — SODIUM CHLORIDE 0.9 % IV SOLN: INTRAVENOUS | Status: DC

## 2014-02-15 MED ORDER — TRAMADOL HCL 50 MG PO TABS: 100.0000 mg | ORAL_TABLET | Freq: Four times a day (QID) | ORAL | Status: DC | PRN

## 2014-02-15 MED ORDER — ACETAMINOPHEN 325 MG PO TABS: 650.0000 mg | ORAL_TABLET | ORAL | Status: DC | PRN

## 2014-02-15 MED ORDER — NITROGLYCERIN IN D5W 200-5 MCG/ML-% IV SOLN
INTRAVENOUS | Status: AC
  Filled 2014-02-15: qty 250

## 2014-02-15 MED ORDER — HEPARIN SODIUM (PORCINE) 5000 UNIT/ML IJ SOLN
5000.0000 [IU] | Freq: Three times a day (TID) | INTRAMUSCULAR | Status: DC
  Administered 2014-02-16: 07:00:00 5000 [IU] via SUBCUTANEOUS
  Filled 2014-02-15 (×4): qty 1

## 2014-02-15 MED ORDER — ASPIRIN 325 MG PO TABS
325.0000 mg | ORAL_TABLET | Freq: Every day | ORAL | Status: DC
  Administered 2014-02-15 – 2014-02-16 (×2): 325 mg via ORAL
  Filled 2014-02-15 (×2): qty 1

## 2014-02-15 MED ORDER — METOPROLOL TARTRATE 25 MG PO TABS
25.0000 mg | ORAL_TABLET | Freq: Two times a day (BID) | ORAL | Status: DC
Start: 2014-02-15 — End: 2014-02-16
  Administered 2014-02-15 – 2014-02-16 (×2): 25 mg via ORAL
  Filled 2014-02-15 (×3): qty 1

## 2014-02-15 MED ORDER — NITROGLYCERIN 0.4 MG SL SUBL
0.4000 mg | SUBLINGUAL_TABLET | SUBLINGUAL | Status: DC | PRN
Start: 2014-02-15 — End: 2014-02-16

## 2014-02-15 MED ORDER — HEPARIN (PORCINE) IN NACL 100-0.45 UNIT/ML-% IJ SOLN
1000.0000 [IU]/h | INTRAMUSCULAR | Status: DC
  Filled 2014-02-15: qty 250

## 2014-02-15 MED ORDER — ROSUVASTATIN CALCIUM 20 MG PO TABS
20.0000 mg | ORAL_TABLET | Freq: Every day | ORAL | Status: DC
  Administered 2014-02-15: 19:00:00 20 mg via ORAL
  Filled 2014-02-15 (×3): qty 1

## 2014-02-15 MED ORDER — NITROGLYCERIN IN D5W 200-5 MCG/ML-% IV SOLN
2.0000 ug/min | INTRAVENOUS | Status: DC
  Administered 2014-02-15: 5 ug/min via INTRAVENOUS

## 2014-02-15 MED ORDER — NITROGLYCERIN 1 MG/10 ML FOR IR/CATH LAB
INTRA_ARTERIAL | Status: AC
  Filled 2014-02-15: qty 10

## 2014-02-15 MED ORDER — PANTOPRAZOLE SODIUM 40 MG PO TBEC
40.0000 mg | DELAYED_RELEASE_TABLET | Freq: Every day | ORAL | Status: DC
  Administered 2014-02-15 – 2014-02-16 (×2): 40 mg via ORAL
  Filled 2014-02-15 (×2): qty 1

## 2014-02-15 MED ORDER — SODIUM CHLORIDE 0.9 % IV SOLN: 250.0000 mL | INTRAVENOUS | Status: DC | PRN

## 2014-02-15 MED ORDER — FENTANYL CITRATE 0.05 MG/ML IJ SOLN
INTRAMUSCULAR | Status: AC
  Filled 2014-02-15: qty 2

## 2014-02-15 MED ORDER — SODIUM CHLORIDE 0.9 % IV SOLN
1.0000 mL/kg/h | INTRAVENOUS | Status: AC
  Administered 2014-02-15: 1 mL/kg/h via INTRAVENOUS

## 2014-02-15 MED ORDER — SODIUM CHLORIDE 0.9 % IJ SOLN: 3.0000 mL | INTRAMUSCULAR | Status: DC | PRN

## 2014-02-15 MED ORDER — DIAZEPAM 5 MG PO TABS: 5.0000 mg | ORAL_TABLET | ORAL | Status: DC | PRN

## 2014-02-15 MED ORDER — ALLOPURINOL 100 MG PO TABS
100.0000 mg | ORAL_TABLET | Freq: Two times a day (BID) | ORAL | Status: DC
  Administered 2014-02-15 – 2014-02-16 (×2): 100 mg via ORAL
  Filled 2014-02-15 (×5): qty 1

## 2014-02-15 MED ORDER — MIDAZOLAM HCL 2 MG/2ML IJ SOLN
INTRAMUSCULAR | Status: AC
  Filled 2014-02-15: qty 2

## 2014-02-15 MED ORDER — SODIUM CHLORIDE 0.9 % IJ SOLN
3.0000 mL | Freq: Two times a day (BID) | INTRAMUSCULAR | Status: DC
  Administered 2014-02-15: 3 mL via INTRAVENOUS

## 2014-02-15 MED ORDER — ACETAMINOPHEN 500 MG PO TABS: 500.0000 mg | ORAL_TABLET | Freq: Four times a day (QID) | ORAL | Status: DC | PRN

## 2014-02-15 MED ORDER — LIDOCAINE HCL (PF) 1 % IJ SOLN
INTRAMUSCULAR | Status: AC
  Filled 2014-02-15: qty 30

## 2014-02-15 MED ORDER — HEPARIN (PORCINE) IN NACL 2-0.9 UNIT/ML-% IJ SOLN
INTRAMUSCULAR | Status: AC
  Filled 2014-02-15: qty 1000

## 2014-02-15 MED ORDER — NON FORMULARY: Freq: Once | Status: DC

## 2014-02-15 NOTE — Care Management Note (Signed)
    Page 1 of 1   02/15/2014     3:00:49 PM CARE MANAGEMENT NOTE 02/15/2014  Patient:  FELTON, BUCZYNSKI   Account Number:  [id number]  Date Initiated:  02/15/2014  Documentation initiated by:  Elissa Hefty  Subjective/Objective Assessment:   adm w mi     Action/Plan:   lives  w wife, pcp dr Cletus Gash pick[place]   Anticipated DC Date:     Anticipated DC Plan:           Choice offered to / List presented to:             Status of service:   Medicare Important Message given?   (If response is "NO", the following Medicare IM given date fields will be blank) Date Medicare IM given:   Medicare IM given by:   Date Additional Medicare IM given:   Additional Medicare IM given by:    Discharge Disposition:    Per UR Regulation:  Reviewed for med. necessity/level of care/duration of stay  If discussed at [place] of Stay Meetings, dates discussed:    Comments:

## 2014-02-15 NOTE — Interval H&P Note (Signed)
History and Physical Interval Note:  02/15/2014 3:34 PM  Chris [name]  has presented today for surgery, with the diagnosis of unstable angina  The various methods of treatment have been discussed with the patient and family. After consideration of risks, benefits and other options for treatment, the patient has consented to  Procedure(s): LEFT HEART CATHETERIZATION WITH CORONARY ANGIOGRAM (N/A) and possible PCI  as a surgical intervention .  The patient's history has been reviewed, patient examined, no change in status, stable for surgery.  I have reviewed the patient's chart and labs.  Questions were answered to the patient's satisfaction.   Cath Lab Visit (complete for each Cath Lab visit)  Clinical Evaluation Leading to the Procedure:   ACS: Yes.    Non-ACS:    Anginal Classification: CCS IV  Anti-ischemic medical therapy: Minimal Therapy (1 class of medications)  Non-Invasive Test Results: No non-invasive testing performed  Prior CABG: Previous CABG         [facility]

## 2014-02-15 NOTE — Progress Notes (Addendum)
ANTICOAGULATION CONSULT NOTE - Initial Consult  Pharmacy Consult for heparin Indication: chest pain/ACS  Allergies  Allergen Reactions  . Lipitor [Atorvastatin] Other (See Comments)    Intolerable muscle pain all over     Patient Measurements: Wt= 113kg Ht= 5' 11' IBW= 75.3 Heparin dosing wt: 100kg  Vital Signs: Temp: 98 F (36.7 C) (09/24 1259) Temp src: Oral (09/24 1259)  Labs: No results found for this basename: HGB, HCT, PLT, APTT, LABPROT, INR, HEPARINUNFRC, CREATININE, CKTOTAL, CKMB, TROPONINI,  in the last 72 hours  The CrCl is unknown because both a height and weight (above a minimum accepted value) are required for this calculation.   Medical History: Past Medical History  Diagnosis Date  . Coronary artery disease     a. 1995 s/p CABG x 3 (VG->RCA, LIMA->LAD, VG->OM);  b. 07/2008 Inf MI/Cath/PCI: VG->RCA 99 - treated with Taxus DES (62mm), LIMA and VG->OM patent, LAD 100, LCX 100, RCA 60d, EF 50%;  c. 09/2008 PCI native RCA  w/ 2.5x23 Xience DES, VG->RCA stent patent;  c. 05/2010 Cath: Native 3VD with 3/3 patent grafts, native RCA w 80% ISR prox to graft insertion-->Med Rx.  . RBBB (right bundle branch block)   . Gout   . Hyperlipidemia   . Obesity   . Hypertension   . ED (erectile dysfunction)   . Vertigo   . Umbilical hernia     a. s/p repair.  . 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 Myocardial infarction   . GERD (gastroesophageal reflux disease)     occ  . Arthritis   . Neuropathy     toes  . H/O hiatal hernia   . Type II diabetes mellitus     Medications:  Prescriptions prior to admission  Medication Sig Dispense Refill  . acetaminophen (TYLENOL) 500 MG tablet Take 500 mg by mouth every 6 (six) hours as needed.      Marland Kitchen allopurinol (ZYLOPRIM) 100 MG tablet 100 mg. Every other day      . aspirin 325 MG tablet Take 325 mg by mouth daily.      Marland Kitchen losartan (COZAAR) 25 MG tablet TAKE 1 TABLET BY MOUTH EVERY DAY  90 tablet  0  .  pantoprazole (PROTONIX) 40 MG tablet Take 40 mg by mouth daily.      . rosuvastatin (CRESTOR) 20 MG tablet Take 20 mg by mouth daily.        Assessment: 73 yo male on heparin for CP and for r/o ACS.  He was transferred from [facility] and is currently on 1000 units/hr started this am and it appears lovenox 100mg  [place] was also given 02/14/14 at about 9pm. Patient for cath at about 4pm today  Goal of Therapy:  Heparin level 0.3-0.7 units/ml Monitor platelets by anticoagulation protocol: Yes   Plan:   -Continue heparin at 1000 units/hr -Will follow progress post cath  Hildred Laser, Pharm D 02/15/2014 1:58 PM

## 2014-02-15 NOTE — CV Procedure (Signed)
Procedure performed: Left ventricle hemodynamics, selective right and left coronary angiography, saphenous vein graft and left internal mammary arteriography, right femoral arteriogram and closure of right femoral arterial access with Perclose  Indication: Patient is a 73 year old Caucasian male with history of remote CABG, has multiple stents to his native right coronary artery, multiple stents in the saphenous vein graft to the distal right coronary artery, last angiography in 2004, has edema to LAD and SVG to obtuse marginal. He had gone fishing in [place], developed severe chest pain very similar to his angina pectoris, was seen in the emergency room in a local hospital, his serum troponin continued to rise and he continued to have mild chest discomfort. He was then transferred to the hospital here at [facility], now undergoing coronary angiography for non-ST elevation myocardial infarction.  Hemodynamic data: Left ventricular pressure was 962/83 with end-diastolic pressure of 15 mm mercury. Aortic pressure 139/81 with a mean of 103 mm mercury. There was no pressure gradient across the aortic valve.   Angiographic data:   Right coronary artery: Large vessel and a dominant vessel. It has multiple stents in the midsegment and in the distal segment. Between the 2 stent struts in the mid and mid to distal segment there is a 50-60% stenosis, proximal to the mid stent in the native vessel, there is a 50% diffuse disease. Competitive filling in the distal right coronary artery is evident. Right coronary artery gives origin to large PDA and PL branch.  SVG to RCA: Is widely patent. Previously placed stent in the mid RCA is widely patent with mild luminal irregularity.  Left main coronary artery: Patent and mild diffuse disease.  Circumflex coronary artery: Occluded in the proximal segment, distal vessel is supplied by saphenous vein graft. A small AV groove branch and AV nodal branch of the circumflex  patent.  SVG to OM1: Is widely patent.  LAD: Occluded after the origin of a small size D1. A large septal perforator has high-grade stenosis in the proximal segment. The LAD has a high-grade 90% stenosis just proximal to the origin of the D1. This D1 is unprotected however is very small. Distal LAD supplied by LIMA.  LIMA to LAD: It is widely patent. It is tortuous. LAD native vessel itself is mildly diffusely diseased.  Impression: Patent grafts, small vessel disease, suspect diagonal disease to be the reason for his unstable angina/non-ST elevation myocardial infarction. However the vessel is very small and there is TIMI-3 flow. Hence we'll leave the lesion alone. He has been complaining of left shoulder pain, it is easily reproducible, this appears to be musculoskeletal.  Technique: Under sterile precautions using micropuncture technique and on radiographic surveillance, I was able to access the right common femoral artery and left heart catheterization was performed using the 6 French sheath which was introduced into the right CFA, and a 5 [place] MP B2 catheter was advanced into the ascending aorta and then into the left ventricle. Left ventricle hemodynamics was performed, catheter was then pulled back into the ascending aorta. Right coronary artery was selected and cannulated and angiography was performed. The same catheter was utilized to perform saphenous vein graft angiography and left subclavian arteriogram, followed by exchanging to a 5 [place] JL4 diagnostic catheter to engage the left main coronary artery. Having performed angiography, the catheter was exchanged to a 5 [place] LIMA catheter and left IMA was cannulated selectively and angiography was performed. Catheter exchanges were done over J-wire. Right femoral arterial was performed through the arterial  access sheath and closed with Perclose with excellent hemostasis. A total of 80 cc of contrast was utilized for diagnostic angiography. No  immediate complication.

## 2014-02-15 NOTE — H&P (Signed)
Chris [name] is an 73 y.o. male.   Chief Complaint: Chest pain HPI: Patient is a fairly active 73 year old Caucasian male with history of CABG in 1995 and presented with inferior wall MI and underwent placement of drug-eluting stent to the right coronary artery vein graft, also has history of stent placement to the native right coronary artery in 2010.  He has chronic right bundle branch block, history of hypertension and hyperlipidemia.  He had been doing well, yesterday went fishing, at that time He developed chest tightness associated diaphoresis and dyspnea.  He was then admitted to a peripheral hospital and on initial presentation the troponin was normal, but there was serial slight elevation in serum troponin although there was no EKG abnormality.  She continued to have chest tightness hence had to be transferred over to [facility] for further evaluation and therapy.  Patient still has chest tightness, described as mild.  He denies any leg edema, painful swelling of the lower extremities, no dizziness or syncope.  He has been taking all his medications appropriately.  His wife is present at the bedside.  Past Medical History  Diagnosis Date  . Coronary artery disease     a. 1995 s/p CABG x 3 (VG->RCA, LIMA->LAD, VG->OM);  b. 07/2008 Inf MI/Cath/PCI: VG->RCA 99 - treated with Taxus DES (40mm), LIMA and VG->OM patent, LAD 100, LCX 100, RCA 60d, EF 50%;  c. 09/2008 PCI native RCA  w/ 2.5x23 Xience DES, VG->RCA stent patent;  c. 05/2010 Cath: Native 3VD with 3/3 patent grafts, native RCA w 80% ISR prox to graft insertion-->Med Rx.  . RBBB (right bundle branch block)   . Gout   . Hyperlipidemia   . Obesity   . Hypertension   . ED (erectile dysfunction)   . Vertigo   . Umbilical hernia     a. s/p repair.  . 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 Myocardial infarction   . GERD (gastroesophageal reflux disease)     occ  . Arthritis   . Neuropathy     toes  .  H/O hiatal hernia   . Type II diabetes mellitus     Past Surgical History  Procedure Laterality Date  . Coronary artery bypass graft  1995    LIMA to LAD, SVG to RCA, SVG to OM.   Marland Kitchen Umbilical hernia repair  12/2007  . Cardiac catheterization  2000    stents x3  . Colonoscopy    . Ankle arthroscopy with drilling/microfracture Left 04/13/2013    Procedure: LEFT ANKLE ARTHROSCOPY WITH EXTENSIVE DEBRIEDMENT;  Surgeon: Wylene Simmer, MD;  Location: [place];  Service: Orthopedics;  Laterality: Left;  . Ankle surgery Left 08/18/2013    DR HEWITT  . Total ankle arthroplasty Left 08/17/2013    Procedure: LEFT TOTAL ANKLE REPLACEMENT WITH POSSIBLE GASTROC RECESSION ;  Surgeon: Wylene Simmer, MD;  Location: [place];  Service: Orthopedics;  Laterality: Left;    Family History  Problem Relation Age of Onset  . Heart disease Father    Social History:  reports that he quit smoking about 43 years ago. His smoking use included Cigarettes. He has a 9 pack-year smoking history. He has quit using smokeless tobacco. His smokeless tobacco use included Chew. He reports that he does not drink alcohol or use illicit drugs.  Allergies:  Allergies  Allergen Reactions  . Lipitor [Atorvastatin] Other (See Comments)    Intolerable muscle pain all over  Medications Prior to Admission  Medication Sig Dispense Refill  . acetaminophen (TYLENOL) 500 MG tablet Take 500 mg by mouth every 6 (six) hours as needed.      Marland Kitchen allopurinol (ZYLOPRIM) 100 MG tablet 100 mg. Every other day      . aspirin 325 MG tablet Take 325 mg by mouth daily.      Marland Kitchen losartan (COZAAR) 25 MG tablet TAKE 1 TABLET BY MOUTH EVERY DAY  90 tablet  0  . pantoprazole (PROTONIX) 40 MG tablet Take 40 mg by mouth daily.      . rosuvastatin (CRESTOR) 20 MG tablet Take 20 mg by mouth daily.        Review of Systems - Negative except dyspnea, no neurologic deficit, no symptoms to suggest TIA or claudication although does have left ankle  pain due to orthopedic issues.  Temperature 98 F (36.7 C), temperature source Oral. General appearance: alert, cooperative, appears stated age, no distress and moderately obese Eyes: negative findings: lids and lashes normal Neck: no adenopathy, no JVD, supple, symmetrical, trachea midline, thyroid not enlarged, symmetric, no tenderness/mass/nodules and Right caroti faint bruit present Neck: JVP - normal, carotids 2+= without bruits Resp: clear to auscultation bilaterally Chest wall: no tenderness Cardio: regular rate and rhythm, S1, S2 normal, no murmur, click, rub or gallop GI: soft, non-tender; bowel sounds normal; no masses,  no organomegaly Extremities: extremities normal, atraumatic, no cyanosis or edema Pulses: 2+ and symmetric Skin: Skin color, texture, turgor normal. No rashes or lesions Neurologic: Grossly normal  No results found for this or any previous visit (from the past 48 hour(s)). No results found.  Labs:   Lab Results  Component Value Date   WBC 5.9 11/15/2013   HGB 14.0 11/15/2013   HCT 42.0 11/15/2013   MCV 94.8 11/15/2013   PLT 168 11/15/2013   No results found for this basename: NA, K, CL, CO2, BUN, CREATININE, CALCIUM, LABALBU, PROT, BILITOT, ALKPHOS, ALT, AST, GLUCOSE,  in the last 168 hours   Lipid Panel     Component Value Date/Time   CHOL 194 11/15/2013 0818   TRIG 391* 11/15/2013 0818   HDL 38* 11/15/2013 0818   CHOLHDL 5.1 11/15/2013 0818   VLDL 78* 11/15/2013 0818   LDLCALC 78 11/15/2013 0818    EKG: 02/14/2014 (peripheral hospital): : Sinus bradycardia,  Rightward axis. Inferior infarct, old. Right bundle branch block. Borderline low voltage complexes.  OUTPATIENT W/U: Carotid artery duplex 02/13/2013: 1. Moderate stenosis of the right distal internal carotid artery, mid internal carotid artery and proximal internal carotid artery (>50% stenosis in the range of 50-69%). Mild stenosis of the right bulb (<50% stenosis in the range of 16-49%). There is  evidence of heterogeneous plaque in the right external carotid artery. The right vessel geometry is tortuous. 2. No evidence of hemodynamically significant stenosis in the left carotid bifurcation vessels. The left vessel geometry is tortuous.  Echo- 01/25/2013 1. Left ventricular cavity is normal in size. Mild inferior hypokinesia. Lower limits systolic global function. Calculated EF 50%. Visual EF is 50-55%. Doppler evidence of Grade I (impaired) diastolic dysfunction. 2. Mitral valve structurally normal. Trace mitral regurgitation. Mitral valve inflow A > E ratio.   Nuclear stress test Lexiscan Myoview stress test 02/06/2013: 1. Resting EKG demonstrates normal sinus rhythm, inferior infarct, old right bundle branch block, cannot exclude true posterior infarct. Low voltage complexes, nonspecific T-wave changes. Stress EKG was non diagnostic for ischemia. No ST-T changes of ischemia noted with pharmacologic stress testing.  Stress symptoms included SOB and lightheadedness. Stress terminated due to completion of protocol. 2. The perfusion imaging study demonstrates a moderate to large size inferior, inferolateral transmural scar with no significant peri-infarct ischemia. Left ventricular systolic function calculated by QGS was moderately depressed. Left ventricular ejection fraction was estimated to be 34%. This represents a low risk study. Patient has prior history of myocardial infarction and known CAD.  Assessment/Plan 1.  NSTEMI 2. CAD of the native coronary vessels and vein bypass grafts Cor angio 07/2008 Inf MI/Cath/PCI: VG->RCA 99% - treated with Taxus DES (60mm), LIMA and VG->OM patent, LAD 100, LCX 100, RCA 60% distal s/p 09/2008 PCI native RCA w/ 2.5x23 Xience DES, EF 50% 3. Hypertension 4. 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 Hyperlipidemia- Mixed 6. Carotid stenosis- asymptomatic.   Rec; patient will be scheduled for coronary angiography due to ongoing chest  pain.  I will start the patient on IV nitroglycerin, also had beta blocker.  On the outpatient basis I started him on Coreg, patient called [place] back stating that he felt extremely fatigued and hence had to be discontinued.  I have discussed with him regarding risks, benefits and alternatives to coronary angiography including but not limited to less than 1% risk of death, stroke, MI, need for urgent CABG, bleeding, total 3% risk of renal failure and need for dialysis.  They're willing to proceed.   Laverda Page, MD 02/15/2014, 2:18 PM [address] [facility] [place] Pager: [phone] Office: [phone] If no answer: Cell:  [phone]

## 2014-02-16 ENCOUNTER — Encounter (HOSPITAL_COMMUNITY): Payer: Self-pay | Admitting: *Deleted

## 2014-02-16 ENCOUNTER — Ambulatory Visit: Payer: Medicare Other | Admitting: Family Medicine

## 2014-02-16 LAB — BASIC METABOLIC PANEL
Anion gap: 14 (ref 5–15)
BUN: 21 mg/dL (ref 6–23)
CO2: 21 mEq/L (ref 19–32)
Calcium: 8.8 mg/dL (ref 8.4–10.5)
Chloride: 103 mEq/L (ref 96–112)
Creatinine, Ser: 1.1 mg/dL (ref 0.50–1.35)
GFR calc Af Amer: 77 mL/min — ABNORMAL LOW (ref >90)
GFR calc non Af Amer: 66 mL/min — ABNORMAL LOW (ref >90)
Glucose, Bld: 106 mg/dL — ABNORMAL HIGH (ref 70–99)
Potassium: 4.3 mEq/L (ref 3.7–5.3)
Sodium: 138 mEq/L (ref 137–147)

## 2014-02-16 LAB — TROPONIN I
Troponin I: <0.3 ng/mL (ref <0.30)
Troponin I: <0.3 ng/mL (ref <0.30)

## 2014-02-16 MED ORDER — TICAGRELOR 90 MG PO TABS: 90.0000 mg | ORAL_TABLET | Freq: Two times a day (BID) | ORAL | Status: DC

## 2014-02-16 MED ORDER — ASPIRIN EC 81 MG PO TBEC: 81.0000 mg | DELAYED_RELEASE_TABLET | Freq: Every day | ORAL | Status: DC

## 2014-02-16 MED ORDER — METOPROLOL TARTRATE 25 MG PO TABS: 25.0000 mg | ORAL_TABLET | Freq: Two times a day (BID) | ORAL | Status: DC

## 2014-02-16 MED ORDER — NITROGLYCERIN 0.4 MG SL SUBL: 0.4000 mg | SUBLINGUAL_TABLET | SUBLINGUAL | Status: DC | PRN

## 2014-02-16 NOTE — Progress Notes (Signed)
CARDIAC REHAB PHASE I   PRE:  Rate/Rhythm: 61 SR  BP:  Supine: 119/58  Sitting:   Standing:    SaO2: 99%RA  MODE:  Ambulation: 500 ft   POST:  Rate/Rhythm: 86SR  BP:  Supine:   Sitting: 148/71  Standing:    SaO2: 96%RA 0805-0911 Pt walked 500 ft on RA with asst x 1. No CP. Education completed with pt who voiced understanding. Pt stated would like to lose weight. Encouraged pt to consider CRP 2 and he agreed to referral to [facility]. Will talk with wife about program. Discussed with pt some heart healthy diet tips with focus on eating more vegetables and less starches/sugars. Encouraged walking for ex.   Graylon Good, RN BSN  02/16/2014 9:08 AM

## 2014-02-16 NOTE — Discharge Summary (Signed)
Physician Discharge Summary  Patient ID: Chris [name] MRN: [id number] DOB/AGE: [birth year] 73 y.o.  Admit date: 02/15/2014 Discharge date: 02/16/2014  Primary Discharge Diagnosis Unstable angina pectoris.  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 Aortic pressure 139/81 with a mean of 103 mm mercury. There was no pressure gradient across the aortic valve.  Angiographic data:  Right coronary artery: Large vessel and a dominant vessel. It has multiple stents in the midsegment and in the distal segment. Between the 2 stent struts in the mid and mid to distal segment there is a 50-60% stenosis, proximal to the mid stent in the native vessel, there is a 50% diffuse disease. Competitive filling in the distal right coronary artery is evident. Right coronary artery gives origin to large PDA and PL branch.  SVG to RCA: Is widely patent. Previously placed stent in the mid RCA is widely patent with mild luminal irregularity.  Left main coronary artery: Patent and mild diffuse disease.  Circumflex coronary artery: Occluded in the proximal segment, distal vessel is supplied by saphenous vein graft. A small AV groove branch and AV nodal branch of the circumflex patent.  SVG to OM1: Is widely patent.  LAD: Occluded after the origin of a small size D1. A large septal perforator has high-grade stenosis in the proximal segment. The LAD has a high-grade 90% stenosis just proximal to the origin of the D1. This D1 is unprotected however is very small. Distal LAD supplied by LIMA.  LIMA to LAD: It is widely patent. It is tortuous. LAD native vessel itself is mildly diffusely diseased.  Impression: Patent grafts, small vessel disease, suspect diagonal disease to be the  reason for his unstable angina/non-ST elevation myocardial infarction. However the vessel is very small and there is TIMI-3 flow. Hence we'll leave the lesion alone. He has been complaining of left shoulder pain, it is easily reproducible, this appears to be musculoskeletal.  Hospital Course: patient was admitted to a peripheral hospital in [place] later he had gone fishing, his cardiac troponins are positive for non-ST elevation myocardial infarction, however on presentation to the hospital, his cardiac pressures were negative for myocardial injury.  He clearly had chest pain suggestive of unstable angina pectoris, underwent coronary angiography and was found to have small vessel disease.  He was ruled out for myocardial infarctions by serial enzymes.  Patient had elevated serum creatinine, on transfer to the hospital, however I repeated an i-STAT in the catheter lab, serum creatinine was normal.  Hence initial entry of chronic renal insufficiency stage III was in error.  Serum creatinine being normal was confirmed by BMP.  He also complained of left shoulder pain.  However this has been ongoing for the past 2 weeks and it is clearly reproducible and hence felt to be musculoskeletal.   Recommendations on discharge: patient will be discharged home today.  I have recommended that we start him on Brilinta 90 mg by mouth twice a day for unstable angina pectoris.  If he continues to have significant symptoms of exertional angina pectoris, he would certainly be a candidate for Ranexa.  He will also continue low-dose of beta blocker for now, metoprolol 25 mg by mouth twice a day was started.  Discharge Exam: Blood pressure 119/58, pulse 63, temperature 98.1 F (36.7 C), temperature source Oral, resp.  rate 18, height 5\' 11"  (1.803 m), weight 108.4 kg (238 lb 15.7 oz), SpO2 96.00%.   General appearance: alert, cooperative, appears stated age, no distress and moderately obese  Eyes: negative findings: lids  and lashes normal  Neck: no adenopathy, no JVD, supple, symmetrical, trachea midline, thyroid not enlarged, symmetric, no tenderness/mass/nodules and Right caroti faint bruit present  Neck: JVP - normal, carotids 2+= without bruits  Resp: clear to auscultation bilaterally  Chest wall: no tenderness  Cardio: regular rate and rhythm, S1, S2 normal, no murmur, click, rub or gallop  GI: soft, non-tender; bowel sounds normal; no masses, no organomegaly  Extremities: extremities normal, atraumatic, no cyanosis or edema  Pulses: 2+ and symmetric, faint right carotid bruit present.  Labs:   Lab Results  Component Value Date   WBC 5.8 02/15/2014   HGB 14.0 02/15/2014   HCT 42.1 02/15/2014   MCV 95.0 02/15/2014   PLT 194 02/15/2014    Recent Labs Lab 02/15/14 1916 02/16/14 0015  NA 141 138  K 4.7 4.3  CL 104 103  CO2 22 21  BUN 19 21  CREATININE 1.10 1.10  CALCIUM 8.9 8.8  PROT 7.4  --   BILITOT 0.5  --   ALKPHOS 57  --   ALT 32  --   AST 41*  --   GLUCOSE 144* 106*   Lab Results  Component Value Date   CKTOTAL 422* 08/20/2008   CKMB 28.9* 08/20/2008   TROPONINI <0.30 02/16/2014    Lipid Panel     Component Value Date/Time   CHOL 153 02/15/2014 1916   TRIG 275* 02/15/2014 1916   HDL 33* 02/15/2014 1916   CHOLHDL 4.6 02/15/2014 1916   VLDL 55* 02/15/2014 1916   LDLCALC 65 02/15/2014 1916    EKG: 02/14/2014 (peripheral hospital): : Sinus bradycardia, Rightward axis. Inferior infarct, old. Right bundle branch block. Borderline low voltage complexes.   OUTPATIENT W/U:  Carotid artery duplex 02/13/2013:  1. Moderate stenosis of the right distal internal carotid artery, mid internal carotid artery and proximal internal carotid artery (>50% stenosis in the range of 50-69%). Mild stenosis of the right bulb (<50% stenosis in the range of 16-49%). There is evidence of heterogeneous plaque in the right external carotid artery. The right vessel geometry is tortuous.  2. No evidence of  hemodynamically significant stenosis in the left carotid bifurcation vessels. The left vessel geometry is tortuous.  Echo- 01/25/2013  1. Left ventricular cavity is normal in size. Mild inferior hypokinesia. Lower limits systolic global function. Calculated EF 50%. Visual EF is 50-55%. Doppler evidence of Grade I (impaired) diastolic dysfunction.  2. Mitral valve structurally normal. Trace mitral regurgitation. Mitral valve inflow A > E ratio.  Nuclear stress test  Lexiscan Myoview stress test 02/06/2013:  1. Resting EKG demonstrates normal sinus rhythm, inferior infarct, old right bundle branch block, cannot exclude true posterior infarct. Low voltage complexes, nonspecific T-wave changes. Stress EKG was non diagnostic for ischemia. No ST-T changes of ischemia noted with pharmacologic stress testing. Stress symptoms included SOB and lightheadedness. Stress terminated due to completion of protocol.  2. The perfusion imaging study demonstrates a moderate to large size inferior, inferolateral transmural scar with no significant peri-infarct ischemia. Left ventricular systolic function calculated by QGS was moderately depressed. Left ventricular ejection fraction was estimated to be 34%. This represents a low risk study. Patient has prior history of myocardial infarction and known CAD.    FOLLOW UP PLANS AND APPOINTMENTS Discharge Instructions  Amb Referral to Cardiac Rehabilitation    Complete by:  As directed             Medication List    STOP taking these medications       aspirin 325 MG tablet  Replaced by:  aspirin EC 81 MG tablet      TAKE these medications       acetaminophen 500 MG tablet  Commonly known as:  TYLENOL  Take 500 mg by mouth every 6 (six) hours as needed (for pain).     allopurinol 100 MG tablet  Commonly known as:  ZYLOPRIM  Take 100 mg by mouth every other day.     aspirin EC 81 MG tablet  Take 1 tablet (81 mg total) by mouth daily.     losartan 25 MG tablet   Commonly known as:  COZAAR  Take 25 mg by mouth daily.     metoprolol tartrate 25 MG tablet  Commonly known as:  LOPRESSOR  Take 1 tablet (25 mg total) by mouth 2 (two) times daily.     nitroGLYCERIN 0.4 MG SL tablet  Commonly known as:  NITROSTAT  Place 1 tablet (0.4 mg total) under the tongue every 5 (five) minutes x 3 doses as needed for chest pain.     rosuvastatin 20 MG tablet  Commonly known as:  CRESTOR  Take 20 mg by mouth daily.     ticagrelor 90 MG Tabs tablet  Commonly known as:  BRILINTA  Take 1 tablet (90 mg total) by mouth 2 (two) times daily.     traMADol 50 MG tablet  Commonly known as:  ULTRAM  Take 50 mg by mouth every 6 (six) hours as needed (for pain).           Follow-up Information   Follow up with Laverda Page, MD. (Call to be seen in 2 weeks)    Specialty:  Cardiology   Contact information:   [address] [place] [address] [phone]        Laverda Page, MD 02/16/2014, 12:07 PM  Pager: [phone] Office: [phone] If no answer: [phone]

## 2014-02-22 ENCOUNTER — Ambulatory Visit (INDEPENDENT_AMBULATORY_CARE_PROVIDER_SITE_OTHER): Payer: Medicare Other | Admitting: Family Medicine

## 2014-02-22 ENCOUNTER — Encounter: Payer: Self-pay | Admitting: Family Medicine

## 2014-02-22 VITALS — BP 102/66 | HR 60 | Temp 97.6°F | Resp 18 | Wt 240.0 lb

## 2014-02-22 DIAGNOSIS — Z09 Encounter for follow-up examination after completed treatment for conditions other than malignant neoplasm: Secondary | ICD-10-CM

## 2014-02-22 NOTE — Progress Notes (Signed)
Subjective:    Patient ID: Chris [name], male    DOB: [birth year], 73 y.o.   MRN: [id number]  HPI Patient was admitted to the hospital with unstable angina.  I have copied relevant portions of the discharge summary below for my reference:  Admit date: 02/15/2014  Discharge date: 02/16/2014  Primary Discharge Diagnosis  Unstable angina pectoris.  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 Aortic pressure 139/81 with a mean of 103 mm mercury. There was no pressure gradient across the aortic valve.  Angiographic data:  Right coronary artery: Large vessel and a dominant vessel. It has multiple stents in the midsegment and in the distal segment. Between the 2 stent struts in the mid and mid to distal segment there is a 50-60% stenosis, proximal to the mid stent in the native vessel, there is a 50% diffuse disease. Competitive filling in the distal right coronary artery is evident. Right coronary artery gives origin to large PDA and PL branch.  SVG to RCA: Is widely patent. Previously placed stent in the mid RCA is widely patent with mild luminal irregularity.  Left main coronary artery: Patent and mild diffuse disease.  Circumflex coronary artery: Occluded in the proximal segment, distal vessel is supplied by saphenous vein graft. A small AV groove branch and AV nodal branch of the circumflex patent.  SVG to OM1: Is widely patent.  LAD: Occluded after the origin of a small size D1. A large septal perforator has high-grade stenosis in the proximal segment. The LAD has a high-grade 90% stenosis just proximal to the origin of the D1. This D1 is unprotected however is very small. Distal LAD supplied by LIMA.  LIMA to LAD: It is widely patent. It is  tortuous. LAD native vessel itself is mildly diffusely diseased.  Impression: Patent grafts, small vessel disease, suspect diagonal disease to be the reason for his unstable angina/non-ST elevation myocardial infarction. However the vessel is very small and there is TIMI-3 flow. Hence we'll leave the lesion alone. He has been complaining of left shoulder pain, it is easily reproducible, this appears to be musculoskeletal.  Hospital Course: patient was admitted to a peripheral hospital in [place] later he had gone fishing, his cardiac troponins are positive for non-ST elevation myocardial infarction, however on presentation to the hospital, his cardiac pressures were negative for myocardial injury. He clearly had chest pain suggestive of unstable angina pectoris, underwent coronary angiography and was found to have small vessel disease. He was ruled out for myocardial infarctions by serial enzymes.  Patient had elevated serum creatinine, on transfer to the hospital, however I repeated an i-STAT in the catheter lab, serum creatinine was normal. Hence initial entry of chronic renal insufficiency stage III was in error. Serum creatinine being normal was confirmed by BMP.  He also complained of left shoulder pain. However this has been ongoing for the past 2 weeks and it is clearly reproducible and hence felt to be musculoskeletal.  Recommendations on discharge: patient will be discharged home today. I have recommended that we start him on Brilinta 90 mg by mouth twice a day for unstable angina pectoris. If he continues to have significant symptoms of exertional angina pectoris, he would certainly be a candidate for Ranexa. He will also continue low-dose of beta blocker for  now, metoprolol 25 mg by mouth twice a day was started.  Labs:  Lab Results   Component  Value  Date    WBC  5.8  02/15/2014    HGB  14.0  02/15/2014    HCT  42.1  02/15/2014    MCV  95.0  02/15/2014    PLT  194  02/15/2014   Recent Labs    Lab  02/15/14 1916  02/16/14 0015   NA  141  138   K  4.7  4.3   CL  104  103   CO2  22  21   BUN  19  21   CREATININE  1.10  1.10   CALCIUM  8.9  8.8   PROT  7.4  --   BILITOT  0.5  --   ALKPHOS  57  --   ALT  32  --   AST  41*  --   GLUCOSE  144*  106*    Lab Results   Component  Value  Date    CKTOTAL  422*  08/20/2008    CKMB  28.9*  08/20/2008    TROPONINI  <0.30  02/16/2014   Lipid Panel    Component  Value  Date/Time    CHOL  153  02/15/2014 1916    TRIG  275*  02/15/2014 1916    HDL  33*  02/15/2014 1916    CHOLHDL  4.6  02/15/2014 1916    VLDL  55*  02/15/2014 1916    LDLCALC  65  02/15/2014 1916   EKG: 02/14/2014 (peripheral hospital): : Sinus bradycardia, Rightward axis. Inferior infarct, old. Right bundle branch block. Borderline low voltage complexes.  OUTPATIENT W/U:  Carotid artery duplex 02/13/2013:  1. Moderate stenosis of the right distal internal carotid artery, mid internal carotid artery and proximal internal carotid artery (>50% stenosis in the range of 50-69%). Mild stenosis of the right bulb (<50% stenosis in the range of 16-49%). There is evidence of heterogeneous plaque in the right external carotid artery. The right vessel geometry is tortuous.  2. No evidence of hemodynamically significant stenosis in the left carotid bifurcation vessels. The left vessel geometry is tortuous.  Echo- 01/25/2013  1. Left ventricular cavity is normal in size. Mild inferior hypokinesia. Lower limits systolic global function. Calculated EF 50%. Visual EF is 50-55%. Doppler evidence of Grade I (impaired) diastolic dysfunction.  2. Mitral valve structurally normal. Trace mitral regurgitation. Mitral valve inflow A > E ratio.  Nuclear stress test  Lexiscan Myoview stress test 02/06/2013:  1. Resting EKG demonstrates normal sinus rhythm, inferior infarct, old right bundle branch block, cannot exclude true posterior infarct. Low voltage complexes, nonspecific T-wave changes. Stress EKG  was non diagnostic for ischemia. No ST-T changes of ischemia noted with pharmacologic stress testing. Stress symptoms included SOB and lightheadedness. Stress terminated due to completion of protocol.  2. The perfusion imaging study demonstrates a moderate to large size inferior, inferolateral transmural scar with no significant peri-infarct ischemia. Left ventricular systolic function calculated by QGS was moderately depressed. Left ventricular ejection fraction was estimated to be 34%. This represents a low risk study. Patient has prior history of myocardial infarction and known CAD.  FOLLOW UP PLANS AND APPOINTMENTS  Discharge Instructions    Amb Referral to Cardiac Rehabilitation  Complete by: As directed              Medication List     STOP taking these medications  aspirin 325 MG tablet    Replaced by: aspirin EC 81 MG tablet     TAKE these medications       acetaminophen 500 MG tablet    Commonly known as: TYLENOL    Take 500 mg by mouth every 6 (six) hours as needed (for pain).    allopurinol 100 MG tablet    Commonly known as: ZYLOPRIM    Take 100 mg by mouth every other day.    aspirin EC 81 MG tablet    Take 1 tablet (81 mg total) by mouth daily.    losartan 25 MG tablet    Commonly known as: COZAAR    Take 25 mg by mouth daily.    metoprolol tartrate 25 MG tablet    Commonly known as: LOPRESSOR    Take 1 tablet (25 mg total) by mouth 2 (two) times daily.    nitroGLYCERIN 0.4 MG SL tablet    Commonly known as: NITROSTAT    Place 1 tablet (0.4 mg total) under the tongue every 5 (five) minutes x 3 doses as needed for chest pain.    rosuvastatin 20 MG tablet    Commonly known as: CRESTOR    Take 20 mg by mouth daily.    ticagrelor 90 MG Tabs tablet    Commonly known as: BRILINTA    Take 1 tablet (90 mg total) by mouth 2 (two) times daily.    traMADol 50 MG tablet    Commonly known as: ULTRAM    Take 50 mg by mouth every 6 (six) hours as needed (for pain).       Since discharge from the hospital, the patient has been doing relatively well. He does report some mild shortness of breath occasionally which possibly is related to [place].  He denies any further angina symptoms or chest pain or chest pressure. Patient states that while he was fishing in [place], he developed severe substernal chest pain and pressure and severe shortness of breath is unrelated to activity. Initial troponins in [place] or elevated consistent with a non-ST elevation MI. However in the catheterization lab, the patient was found to have mild diffuse disease. The most significant contributor is a 90% stenosis just proximal to the origin of D1 off the LAD.  His cardiologist is managing the patient medically adding Brillenta to aspirin.  If he continues to have anginal symptoms, the plan is to add ranexa.  At present he is asymptomatic. Regarding his risk factors, his blood pressure is excellent. His LDL cholesterol 78 when checked in June. His daughter will now be less than 86. He is currently taking Crestor 20 mg by mouth daily. A1c was also borderline at 6.6. The patient has type 2 diabetes mellitus but he is managing it through diet. Past Medical History  Diagnosis Date  . Coronary artery disease     a. 1995 s/p CABG x 3 (VG->RCA, LIMA->LAD, VG->OM);  b. 07/2008 Inf MI/Cath/PCI: VG->RCA 99 - treated with Taxus DES (16mm), LIMA and VG->OM patent, LAD 100, LCX 100, RCA 60d, EF 50%;  c. 09/2008 PCI native RCA  w/ 2.5x23 Xience DES, VG->RCA stent patent;  c. 05/2010 Cath: Native 3VD with 3/3 patent grafts, native RCA w 80% ISR prox to graft insertion-->Med Rx.  . RBBB (right bundle branch block)   . Gout   . Hyperlipidemia   . Obesity   . Hypertension   . ED (erectile dysfunction)   . Vertigo   . Umbilical hernia  a. s/p repair.  . 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 Myocardial infarction   . GERD (gastroesophageal reflux disease)     occ  . Arthritis    . Neuropathy     toes  . H/O hiatal hernia   . Type II diabetes mellitus    Past Surgical History  Procedure Laterality Date  . Coronary artery bypass graft  1995    LIMA to LAD, SVG to RCA, SVG to OM.   Marland Kitchen Umbilical hernia repair  12/2007  . Cardiac catheterization  2000    stents x3  . Colonoscopy    . Ankle arthroscopy with drilling/microfracture Left 04/13/2013    Procedure: LEFT ANKLE ARTHROSCOPY WITH EXTENSIVE DEBRIEDMENT;  Surgeon: Wylene Simmer, MD;  Location: [facility];  Service: Orthopedics;  Laterality: Left;  . Ankle surgery Left 08/18/2013    DR HEWITT  . Total ankle arthroplasty Left 08/17/2013    Procedure: LEFT TOTAL ANKLE REPLACEMENT WITH POSSIBLE GASTROC RECESSION ;  Surgeon: Wylene Simmer, MD;  Location: [facility];  Service: Orthopedics;  Laterality: Left;   Current Outpatient Prescriptions on File Prior to Visit  Medication Sig Dispense Refill  . acetaminophen (TYLENOL) 500 MG tablet Take 500 mg by mouth every 6 (six) hours as needed (for pain).       Marland Kitchen allopurinol (ZYLOPRIM) 100 MG tablet Take 100 mg by mouth every other day.       Marland Kitchen aspirin EC 81 MG tablet Take 1 tablet (81 mg total) by mouth daily.      Marland Kitchen losartan (COZAAR) 25 MG tablet Take 25 mg by mouth daily.      . metoprolol tartrate (LOPRESSOR) 25 MG tablet Take 1 tablet (25 mg total) by mouth 2 (two) times daily.  60 tablet  6  . nitroGLYCERIN (NITROSTAT) 0.4 MG SL tablet Place 1 tablet (0.4 mg total) under the tongue every 5 (five) minutes x 3 doses as needed for chest pain.    12  . rosuvastatin (CRESTOR) 20 MG tablet Take 20 mg by mouth daily.      . ticagrelor (BRILINTA) 90 MG TABS tablet Take 1 tablet (90 mg total) by mouth 2 (two) times daily.  60 tablet  0   No current facility-administered medications on file prior to visit.   Allergies  Allergen Reactions  . Lipitor [Atorvastatin] Other (See Comments)    Intolerable muscle pain all over    History   Social History  . Marital  Status: Married    Spouse Name: N/A    Number of Children: N/A  . Years of Education: N/A   Occupational History  . Not on file.   Social History Main Topics  . Smoking status: Former Smoker -- 1.00 packs/day for 9 years    Types: Cigarettes    Quit date: 10/31/1970  . Smokeless tobacco: Former Systems developer    Types: Chew     Comment: chewed for 2 years  . Alcohol Use: No     Comment: occ wine last 6 months  . Drug Use: No  . Sexual Activity: Yes   Other Topics Concern  . Not on file   Social History Narrative   Lives in [place].  Retired.     Review of Systems  All other systems reviewed and are negative.      Objective:   Physical Exam  Vitals reviewed. Constitutional: He appears well-developed and well-nourished. No distress.  Neck:  Neck supple. No JVD present. No thyromegaly present.  Cardiovascular: Normal rate, regular rhythm and normal heart sounds.   No murmur heard. Pulmonary/Chest: Effort normal and breath sounds normal. No respiratory distress. He has no wheezes. He has no rales.  Abdominal: Soft. Bowel sounds are normal. He exhibits no distension. There is no tenderness. There is no rebound and no guarding.  Musculoskeletal: He exhibits no edema.  Lymphadenopathy:    He has no cervical adenopathy.          Assessment & Plan:  Hospital discharge follow-up - Plan: CBC with Differential, COMPLETE METABOLIC PANEL WITH GFR, Hemoglobin A1c  Patient's blood pressure is currently well controlled. I would like to increase his Crestor to 40 mg by mouth daily to try to drive his LDL cholesterol less than 70. I like to try to keep his hemoglobin A1c less than 6.5. Discussed low carbohydrate diet. We also discussed increasing aerobic exercise gradually to 30 minutes a day 5 days a week. I recommended weight loss to reduce his risk of future CV morbidity.  I will recheck the patient in 3 months

## 2014-02-23 LAB — CBC WITH DIFFERENTIAL/PLATELET
Basophils Absolute: 0.1 10*3/uL (ref 0.0–0.1)
Basophils Relative: 1 % (ref 0–1)
Eosinophils Absolute: 0.1 10*3/uL (ref 0.0–0.7)
Eosinophils Relative: 2 % (ref 0–5)
HCT: 46 % (ref 39.0–52.0)
Hemoglobin: 16.1 g/dL (ref 13.0–17.0)
Lymphocytes Relative: 31 % (ref 12–46)
Lymphs Abs: 2.1 10*3/uL (ref 0.7–4.0)
MCH: 32.2 pg (ref 26.0–34.0)
MCHC: 35 g/dL (ref 30.0–36.0)
MCV: 92 fL (ref 78.0–100.0)
Monocytes Absolute: 0.5 10*3/uL (ref 0.1–1.0)
Monocytes Relative: 7 % (ref 3–12)
Neutro Abs: 4 10*3/uL (ref 1.7–7.7)
Neutrophils Relative %: 59 % (ref 43–77)
Platelets: 260 10*3/uL (ref 150–400)
RBC: 5 MIL/uL (ref 4.22–5.81)
RDW: 14.1 % (ref 11.5–15.5)
WBC: 6.8 10*3/uL (ref 4.0–10.5)

## 2014-02-23 LAB — COMPLETE METABOLIC PANEL WITH GFR
ALT: 32 U/L (ref 0–53)
AST: 31 U/L (ref 0–37)
Albumin: 4.3 g/dL (ref 3.5–5.2)
Alkaline Phosphatase: 57 U/L (ref 39–117)
BUN: 21 mg/dL (ref 6–23)
CO2: 21 mEq/L (ref 19–32)
Calcium: 9.7 mg/dL (ref 8.4–10.5)
Chloride: 104 mEq/L (ref 96–112)
Creat: 1.37 mg/dL — ABNORMAL HIGH (ref 0.50–1.35)
GFR, Est African American: 60 mL/min
GFR, Est Non African American: 52 mL/min — ABNORMAL LOW
Glucose, Bld: 98 mg/dL (ref 70–99)
Potassium: 4.2 mEq/L (ref 3.5–5.3)
Sodium: 137 mEq/L (ref 135–145)
Total Bilirubin: 0.5 mg/dL (ref 0.2–1.2)
Total Protein: 7.7 g/dL (ref 6.0–8.3)

## 2014-02-23 LAB — HEMOGLOBIN A1C
Hgb A1c MFr Bld: 6.5 % — ABNORMAL HIGH (ref <5.7)
Mean Plasma Glucose: 140 mg/dL — ABNORMAL HIGH (ref <117)

## 2014-02-25 ENCOUNTER — Other Ambulatory Visit: Payer: Self-pay | Admitting: Family Medicine

## 2014-03-07 ENCOUNTER — Telehealth (HOSPITAL_COMMUNITY): Payer: Self-pay | Admitting: Cardiac Rehabilitation

## 2014-03-07 NOTE — Telephone Encounter (Signed)
pc to pt to schedule cardiac rehab. Pt declined due to schedule conflicts. Pt states he is exercising on his own.  [name] made aware.

## 2014-03-12 ENCOUNTER — Other Ambulatory Visit: Payer: Self-pay | Admitting: Family Medicine

## 2014-03-12 NOTE — Telephone Encounter (Signed)
Medication refilled per protocol. 

## 2014-05-03 ENCOUNTER — Encounter (HOSPITAL_COMMUNITY): Payer: Self-pay | Admitting: Cardiology

## 2014-05-28 ENCOUNTER — Ambulatory Visit (INDEPENDENT_AMBULATORY_CARE_PROVIDER_SITE_OTHER): Payer: Medicare Other | Admitting: Family Medicine

## 2014-05-28 ENCOUNTER — Encounter: Payer: Self-pay | Admitting: Family Medicine

## 2014-05-28 VITALS — BP 120/78 | HR 60 | Temp 98.7°F | Resp 16 | Ht 71.5 in | Wt 244.0 lb

## 2014-05-28 DIAGNOSIS — I2510 Atherosclerotic heart disease of native coronary artery without angina pectoris: Secondary | ICD-10-CM

## 2014-05-28 DIAGNOSIS — E785 Hyperlipidemia, unspecified: Secondary | ICD-10-CM

## 2014-05-28 DIAGNOSIS — E119 Type 2 diabetes mellitus without complications: Secondary | ICD-10-CM

## 2014-05-28 DIAGNOSIS — I10 Essential (primary) hypertension: Secondary | ICD-10-CM

## 2014-05-28 MED ORDER — TICAGRELOR 90 MG PO TABS: 90.0000 mg | ORAL_TABLET | Freq: Two times a day (BID) | ORAL | Status: DC

## 2014-05-28 NOTE — Progress Notes (Signed)
Subjective:    Patient ID: Chris [name], male    DOB: 07/21/1940, 74 y.o.   MRN: [id number]  HPI 02/22/14 Patient was admitted to the hospital with unstable angina.  I have copied relevant portions of the discharge summary below for my reference:  Admit date: 02/15/2014  Discharge date: 02/16/2014  Primary Discharge Diagnosis  Unstable angina pectoris.  CAD both native vessel and venous bypass graft with H/O CABG  Secondary Discharge Diagnosis  Hypertension  Mixed hyperlipidemia  Significant Diagnostic Studies:  02/15/2014: Coronary angiogram: Hemodynamic data: Left ventricular pressure was 163/84 with end-diastolic pressure of 15 mm mercury. Aortic pressure 139/81 with a mean of 103 mm mercury. There was no pressure gradient across the aortic valve.  Angiographic data:  Right coronary artery: Large vessel and a dominant vessel. It has multiple stents in the midsegment and in the distal segment. Between the 2 stent struts in the mid and mid to distal segment there is a 50-60% stenosis, proximal to the mid stent in the native vessel, there is a 50% diffuse disease. Competitive filling in the distal right coronary artery is evident. Right coronary artery gives origin to large PDA and PL branch.  SVG to RCA: Is widely patent. Previously placed stent in the mid RCA is widely patent with mild luminal irregularity.  Left main coronary artery: Patent and mild diffuse disease.  Circumflex coronary artery: Occluded in the proximal segment, distal vessel is supplied by saphenous vein graft. A small AV groove branch and AV nodal branch of the circumflex patent.  SVG to OM1: Is widely patent.  LAD: Occluded after the origin of a small size D1. A large septal perforator has high-grade stenosis in the proximal segment. The LAD has a high-grade 90% stenosis just proximal to the origin of the D1. This D1 is unprotected however is very small. Distal LAD supplied by LIMA.  LIMA to LAD: It is widely patent. It  is tortuous. LAD native vessel itself is mildly diffusely diseased.  Impression: Patent grafts, small vessel disease, suspect diagonal disease to be the reason for his unstable angina/non-ST elevation myocardial infarction. However the vessel is very small and there is TIMI-3 flow. Hence we'll leave the lesion alone. He has been complaining of left shoulder pain, it is easily reproducible, this appears to be musculoskeletal.  Hospital Course: patient was admitted to a peripheral hospital in [place] later he had gone fishing, his cardiac troponins are positive for non-ST elevation myocardial infarction, however on presentation to the hospital, his cardiac pressures were negative for myocardial injury. He clearly had chest pain suggestive of unstable angina pectoris, underwent coronary angiography and was found to have small vessel disease. He was ruled out for myocardial infarctions by serial enzymes.  Patient had elevated serum creatinine, on transfer to the hospital, however I repeated an i-STAT in the catheter lab, serum creatinine was normal. Hence initial entry of chronic renal insufficiency stage III was in error. Serum creatinine being normal was confirmed by BMP.  He also complained of left shoulder pain. However this has been ongoing for the past 2 weeks and it is clearly reproducible and hence felt to be musculoskeletal.  Recommendations on discharge: patient will be discharged home today. I have recommended that we start him on Brilinta 90 mg by mouth twice a day for unstable angina pectoris. If he continues to have significant symptoms of exertional angina pectoris, he would certainly be a candidate for Ranexa. He will also continue low-dose of beta blocker  for now, metoprolol 25 mg by mouth twice a day was started.  Labs:  Lab Results   Component  Value  Date    WBC  5.8  02/15/2014    HGB  14.0  02/15/2014    HCT  42.1  02/15/2014    MCV  95.0  02/15/2014    PLT  194  02/15/2014   Recent Labs    Lab  02/15/14 1916  02/16/14 0015   NA  141  138   K  4.7  4.3   CL  104  103   CO2  22  21   BUN  19  21   CREATININE  1.10  1.10   CALCIUM  8.9  8.8   PROT  7.4  --   BILITOT  0.5  --   ALKPHOS  57  --   ALT  32  --   AST  41*  --   GLUCOSE  144*  106*    Lab Results   Component  Value  Date    CKTOTAL  422*  08/20/2008    CKMB  28.9*  08/20/2008    TROPONINI  <0.30  02/16/2014   Lipid Panel    Component  Value  Date/Time    CHOL  153  02/15/2014 1916    TRIG  275*  02/15/2014 1916    HDL  33*  02/15/2014 1916    CHOLHDL  4.6  02/15/2014 1916    VLDL  55*  02/15/2014 1916    LDLCALC  65  02/15/2014 1916   EKG: 02/14/2014 (peripheral hospital): : Sinus bradycardia, Rightward axis. Inferior infarct, old. Right bundle branch block. Borderline low voltage complexes.  OUTPATIENT W/U:  Carotid artery duplex 02/13/2013:  1. Moderate stenosis of the right distal internal carotid artery, mid internal carotid artery and proximal internal carotid artery (>50% stenosis in the range of 50-69%). Mild stenosis of the right bulb (<50% stenosis in the range of 16-49%). There is evidence of heterogeneous plaque in the right external carotid artery. The right vessel geometry is tortuous.  2. No evidence of hemodynamically significant stenosis in the left carotid bifurcation vessels. The left vessel geometry is tortuous.  Echo- 01/25/2013  1. Left ventricular cavity is normal in size. Mild inferior hypokinesia. Lower limits systolic global function. Calculated EF 50%. Visual EF is 50-55%. Doppler evidence of Grade I (impaired) diastolic dysfunction.  2. Mitral valve structurally normal. Trace mitral regurgitation. Mitral valve inflow A > E ratio.  Nuclear stress test  Lexiscan Myoview stress test 02/06/2013:  1. Resting EKG demonstrates normal sinus rhythm, inferior infarct, old right bundle branch block, cannot exclude true posterior infarct. Low voltage complexes, nonspecific T-wave changes. Stress EKG  was non diagnostic for ischemia. No ST-T changes of ischemia noted with pharmacologic stress testing. Stress symptoms included SOB and lightheadedness. Stress terminated due to completion of protocol.  2. The perfusion imaging study demonstrates a moderate to large size inferior, inferolateral transmural scar with no significant peri-infarct ischemia. Left ventricular systolic function calculated by QGS was moderately depressed. Left ventricular ejection fraction was estimated to be 34%. This represents a low risk study. Patient has prior history of myocardial infarction and known CAD.   Since discharge from the hospital, the patient has been doing relatively well. He does report some mild shortness of breath occasionally which possibly is related to [place].  He denies any further angina symptoms or chest pain or chest pressure. Patient states that while he  was fishing in [place], he developed severe substernal chest pain and pressure and severe shortness of breath is unrelated to activity. Initial troponins in [place] or elevated consistent with a non-ST elevation MI. However in the catheterization lab, the patient was found to have mild diffuse disease. The most significant contributor is a 90% stenosis just proximal to the origin of D1 off the LAD.  His cardiologist is managing the patient medically adding Brillenta to aspirin.  If he continues to have anginal symptoms, the plan is to add ranexa.  At present he is asymptomatic. Regarding his risk factors, his blood pressure is excellent. His LDL cholesterol 78 when checked in June. His goal will now be less than 70. He is currently taking Crestor 20 mg by mouth daily. A1c was also borderline at 6.6. The patient has type 2 diabetes mellitus but he is managing it through diet.  At that time, my plan was:  Patient's blood pressure is currently well controlled. I would like to increase his Crestor to 40 mg by mouth daily to try to drive his LDL cholesterol  less than 70. I like to try to keep his hemoglobin A1c less than 6.5. Discussed low carbohydrate diet. We also discussed increasing aerobic exercise gradually to 30 minutes a day 5 days a week. I recommended weight loss to reduce his risk of future CV morbidity.  I will recheck the patient in 3 months  05/28/14 The patient is here for follow up.  He has done very well since leaving the hospital. Somehow the patient has discontinued Brilinta.  I have no record in his chart were anyone has told him to discontinue the medication. I have strongly advised the patient resume that medication. Patient also is unable to tolerate Crestor 40 mg a day due to myalgias. He is currently on 20 mg a day. He denies any chest pain shortness of breath or dyspnea on exertion. Overall he is been doing extremely well. He denies any neuropathy in his feet. He is due for a flu shot but he refuses that. He is also due for Pneumovax 23 Past Medical History  Diagnosis Date  . Coronary artery disease     a. 1995 s/p CABG x 3 (VG->RCA, LIMA->LAD, VG->OM);  b. 07/2008 Inf MI/Cath/PCI: VG->RCA 99 - treated with Taxus DES (56mm), LIMA and VG->OM patent, LAD 100, LCX 100, RCA 60d, EF 50%;  c. 09/2008 PCI native RCA  w/ 2.5x23 Xience DES, VG->RCA stent patent;  c. 05/2010 Cath: Native 3VD with 3/3 patent grafts, native RCA w 80% ISR prox to graft insertion-->Med Rx.  . RBBB (right bundle branch block)   . Gout   . Hyperlipidemia   . Obesity   . Hypertension   . ED (erectile dysfunction)   . Vertigo   . Umbilical hernia     a. s/p repair.  . 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 Myocardial infarction   . GERD (gastroesophageal reflux disease)     occ  . Arthritis   . Neuropathy     toes  . H/O hiatal hernia   . Type II diabetes mellitus    Past Surgical History  Procedure Laterality Date  . Coronary artery bypass graft  1995    LIMA to LAD, SVG to RCA, SVG to OM.   Marland Kitchen Umbilical hernia repair  12/2007    . Cardiac catheterization  2000    stents x3  . Colonoscopy    .  Ankle arthroscopy with drilling/microfracture Left 04/13/2013    Procedure: LEFT ANKLE ARTHROSCOPY WITH EXTENSIVE DEBRIEDMENT;  Surgeon: Wylene Simmer, MD;  Location: [place];  Service: Orthopedics;  Laterality: Left;  . Ankle surgery Left 08/18/2013    DR HEWITT  . Total ankle arthroplasty Left 08/17/2013    Procedure: LEFT TOTAL ANKLE REPLACEMENT WITH POSSIBLE GASTROC RECESSION ;  Surgeon: Wylene Simmer, MD;  Location: [place];  Service: Orthopedics;  Laterality: Left;  . Cardiac catheterization  02/15/2014    Procedure: LEFT HEART CATH AND CORS/GRAFTS ANGIOGRAPHY;  Surgeon: Laverda Page, MD;  Location: [facility] C[facility] LAB;  Service: Cardiovascular;;   Current Outpatient Prescriptions on File Prior to Visit  Medication Sig Dispense Refill  . acetaminophen (TYLENOL) 500 MG tablet Take 500 mg by mouth every 6 (six) hours as needed (for pain).     Marland Kitchen allopurinol (ZYLOPRIM) 100 MG tablet TAKE 1 TABLET BY MOUTH EVERY DAY 30 tablet 3  . aspirin EC 81 MG tablet Take 1 tablet (81 mg total) by mouth daily.    . CRESTOR 20 MG tablet TAKE 1 TABLET BY MOUTH EVERY DAY 30 tablet 3  . losartan (COZAAR) 25 MG tablet Take 25 mg by mouth daily.    . metoprolol tartrate (LOPRESSOR) 25 MG tablet Take 1 tablet (25 mg total) by mouth 2 (two) times daily. 60 tablet 6  . nitroGLYCERIN (NITROSTAT) 0.4 MG SL tablet Place 1 tablet (0.4 mg total) under the tongue every 5 (five) minutes x 3 doses as needed for chest pain.  12  . pantoprazole (PROTONIX) 40 MG tablet Take 1 tablet by mouth as needed.    . rosuvastatin (CRESTOR) 20 MG tablet Take 20 mg by mouth daily.    . ticagrelor (BRILINTA) 90 MG TABS tablet Take 1 tablet (90 mg total) by mouth 2 (two) times daily. 60 tablet 0   No current facility-administered medications on file prior to visit.   Allergies  Allergen Reactions  . Lipitor [Atorvastatin] Other (See Comments)    Intolerable  muscle pain all over    History   Social History  . Marital Status: Married    Spouse Name: N/A    Number of Children: N/A  . Years of Education: N/A   Occupational History  . Not on file.   Social History Main Topics  . Smoking status: Former Smoker -- 1.00 packs/day for 9 years    Types: Cigarettes    Quit date: 10/31/1970  . Smokeless tobacco: Former Systems developer    Types: Chew     Comment: chewed for 2 years  . Alcohol Use: No     Comment: occ wine last 6 months  . Drug Use: No  . Sexual Activity: Yes   Other Topics Concern  . Not on file   Social History Narrative   Lives in [place] with wife.  Retired.     Review of Systems  All other systems reviewed and are negative.      Objective:   Physical Exam  Constitutional: He appears well-developed and well-nourished.  Neck: Neck supple. No JVD present. No thyromegaly present.  Cardiovascular: Normal rate, regular rhythm, normal heart sounds and intact distal pulses.   No murmur heard. Pulmonary/Chest: Effort normal and breath sounds normal. No respiratory distress. He has no wheezes. He has no rales.  Abdominal: Soft. Bowel sounds are normal. He exhibits no distension. There is no tenderness. There is no rebound and no guarding.  Musculoskeletal: He exhibits no edema.  Lymphadenopathy:    He has no cervical adenopathy.  Vitals reviewed.         Assessment & Plan:  Essential hypertension - Plan: COMPLETE METABOLIC PANEL WITH GFR, Lipid panel  HLD (hyperlipidemia) - Plan: COMPLETE METABOLIC PANEL WITH GFR, Lipid panel  ASCVD (arteriosclerotic cardiovascular disease) - Plan: COMPLETE METABOLIC PANEL WITH GFR, Lipid panel, ticagrelor (BRILINTA) 90 MG TABS tablet  Diabetes mellitus type II, controlled - Plan: COMPLETE METABOLIC PANEL WITH GFR, Hemoglobin A1c, Lipid panel, Microalbumin, urine  She is blood pressure is excellent. I would like him to return fasting for a fasting lipid panel. Goal LDL cholesterol  is less than 70. I would also like to check his hemoglobin A1c as well as his urine microalbumin. Goal hemoglobin A1c is less than 6.5. I instructed the patient to resume Brilinta. I also want him to verify this with his cardiologist prior to resuming the medication. I recommended a flu shot but the patient declined. The patient will receive his Pneumovax 23 today.

## 2014-05-30 ENCOUNTER — Telehealth: Payer: Self-pay | Admitting: Family Medicine

## 2014-05-30 ENCOUNTER — Other Ambulatory Visit: Payer: Medicare Other

## 2014-05-30 DIAGNOSIS — Z79899 Other long term (current) drug therapy: Secondary | ICD-10-CM

## 2014-05-30 DIAGNOSIS — R739 Hyperglycemia, unspecified: Secondary | ICD-10-CM

## 2014-05-30 DIAGNOSIS — E785 Hyperlipidemia, unspecified: Secondary | ICD-10-CM

## 2014-05-30 DIAGNOSIS — E119 Type 2 diabetes mellitus without complications: Secondary | ICD-10-CM

## 2014-05-30 DIAGNOSIS — I10 Essential (primary) hypertension: Secondary | ICD-10-CM

## 2014-05-30 DIAGNOSIS — I2510 Atherosclerotic heart disease of native coronary artery without angina pectoris: Secondary | ICD-10-CM

## 2014-05-30 LAB — LIPID PANEL
Cholesterol: 151 mg/dL (ref 0–200)
HDL: 37 mg/dL — ABNORMAL LOW (ref >39)
LDL Cholesterol: 65 mg/dL (ref 0–99)
Total CHOL/HDL Ratio: 4.1 Ratio
Triglycerides: 245 mg/dL — ABNORMAL HIGH (ref <150)
VLDL: 49 mg/dL — ABNORMAL HIGH (ref 0–40)

## 2014-05-30 LAB — CBC WITH DIFFERENTIAL/PLATELET
Basophils Absolute: 0 10*3/uL (ref 0.0–0.1)
Basophils Relative: 0 % (ref 0–1)
Eosinophils Absolute: 0.3 10*3/uL (ref 0.0–0.7)
Eosinophils Relative: 3 % (ref 0–5)
HCT: 44.7 % (ref 39.0–52.0)
Hemoglobin: 15.5 g/dL (ref 13.0–17.0)
Lymphocytes Relative: 30 % (ref 12–46)
Lymphs Abs: 2.7 10*3/uL (ref 0.7–4.0)
MCH: 31.3 pg (ref 26.0–34.0)
MCHC: 34.7 g/dL (ref 30.0–36.0)
MCV: 90.3 fL (ref 78.0–100.0)
MPV: 8.9 fL (ref 8.6–12.4)
Monocytes Absolute: 0.9 10*3/uL (ref 0.1–1.0)
Monocytes Relative: 10 % (ref 3–12)
Neutro Abs: 5.2 10*3/uL (ref 1.7–7.7)
Neutrophils Relative %: 57 % (ref 43–77)
Platelets: 221 10*3/uL (ref 150–400)
RBC: 4.95 MIL/uL (ref 4.22–5.81)
RDW: 13.7 % (ref 11.5–15.5)
WBC: 9.1 10*3/uL (ref 4.0–10.5)

## 2014-05-30 LAB — COMPLETE METABOLIC PANEL WITH GFR
ALT: 14 U/L (ref 0–53)
AST: 16 U/L (ref 0–37)
Albumin: 3.7 g/dL (ref 3.5–5.2)
Alkaline Phosphatase: 66 U/L (ref 39–117)
BUN: 13 mg/dL (ref 6–23)
CO2: 25 mEq/L (ref 19–32)
Calcium: 9.5 mg/dL (ref 8.4–10.5)
Chloride: 100 mEq/L (ref 96–112)
Creat: 1.16 mg/dL (ref 0.50–1.35)
GFR, Est African American: 73 mL/min
GFR, Est Non African American: 63 mL/min
Glucose, Bld: 141 mg/dL — ABNORMAL HIGH (ref 70–99)
Potassium: 4.4 mEq/L (ref 3.5–5.3)
Sodium: 136 mEq/L (ref 135–145)
Total Bilirubin: 0.9 mg/dL (ref 0.2–1.2)
Total Protein: 7.5 g/dL (ref 6.0–8.3)

## 2014-05-30 NOTE — Telephone Encounter (Signed)
Patient is calling to let dr pickard know that his cardiologist told him not to take his brilinta  Has some questions about this please call him at [phone]

## 2014-05-31 ENCOUNTER — Encounter: Payer: Self-pay | Admitting: *Deleted

## 2014-05-31 LAB — HEMOGLOBIN A1C
Hgb A1c MFr Bld: 6.6 % — ABNORMAL HIGH (ref <5.7)
Mean Plasma Glucose: 143 mg/dL — ABNORMAL HIGH (ref <117)

## 2014-05-31 NOTE — Telephone Encounter (Signed)
I would defer to his cardiologists recs, what specific questions does he have.

## 2014-06-01 ENCOUNTER — Telehealth: Payer: Self-pay | Admitting: Family Medicine

## 2014-06-01 ENCOUNTER — Other Ambulatory Visit: Payer: Medicare Other

## 2014-06-01 DIAGNOSIS — E119 Type 2 diabetes mellitus without complications: Secondary | ICD-10-CM

## 2014-06-01 NOTE — Telephone Encounter (Signed)
Patient is calling about lab results  [phone]

## 2014-06-01 NOTE — Telephone Encounter (Signed)
LMTRC

## 2014-06-02 LAB — MICROALBUMIN, URINE: Microalb, Ur: 0.6 mg/dL (ref <2.0)

## 2014-06-05 ENCOUNTER — Encounter: Payer: Self-pay | Admitting: Family Medicine

## 2014-06-08 ENCOUNTER — Other Ambulatory Visit: Payer: Self-pay

## 2014-06-08 NOTE — Telephone Encounter (Signed)
Error

## 2014-06-08 NOTE — Telephone Encounter (Signed)
Letters sent to pt per result note about tests

## 2014-06-11 NOTE — Telephone Encounter (Signed)
LMTRC

## 2014-06-18 NOTE — Telephone Encounter (Signed)
No return call - closing encounter

## 2014-06-21 ENCOUNTER — Other Ambulatory Visit: Payer: Self-pay

## 2014-06-21 MED ORDER — LOSARTAN POTASSIUM 25 MG PO TABS: 25.0000 mg | ORAL_TABLET | Freq: Every day | ORAL | Status: DC

## 2014-06-24 ENCOUNTER — Other Ambulatory Visit: Payer: Self-pay | Admitting: Family Medicine

## 2014-06-25 ENCOUNTER — Other Ambulatory Visit: Payer: Self-pay | Admitting: Cardiology

## 2014-06-26 NOTE — Telephone Encounter (Signed)
He needs to schedule an office visit for Dr Aundra Dubin to continue to fill his medication.

## 2014-07-01 ENCOUNTER — Other Ambulatory Visit: Payer: Self-pay | Admitting: Cardiology

## 2014-07-03 NOTE — Telephone Encounter (Signed)
In order for Dr Aundra Dubin to continue prescribing losartan, pt needs to schedule an appt with Dr Aundra Dubin.  Once he has an appt scheduled you can give him enough medication to last until his appt with Dr Aundra Dubin.  Luisa Dago, Dr Claris Gladden scheduler can help schedule an appt for pt with Dr Aundra Dubin.   Thanks.

## 2014-07-05 ENCOUNTER — Other Ambulatory Visit: Payer: Self-pay | Admitting: Family Medicine

## 2014-07-05 ENCOUNTER — Other Ambulatory Visit: Payer: Self-pay | Admitting: Cardiology

## 2014-07-09 ENCOUNTER — Other Ambulatory Visit: Payer: Self-pay | Admitting: Nurse Practitioner

## 2014-07-09 MED ORDER — LOSARTAN POTASSIUM 25 MG PO TABS: 25.0000 mg | ORAL_TABLET | Freq: Every day | ORAL | Status: DC

## 2014-07-19 ENCOUNTER — Other Ambulatory Visit: Payer: Self-pay | Admitting: Family Medicine

## 2014-09-06 ENCOUNTER — Encounter: Payer: Self-pay | Admitting: Family Medicine

## 2014-10-10 ENCOUNTER — Encounter: Payer: Self-pay | Admitting: Family Medicine

## 2014-10-21 ENCOUNTER — Other Ambulatory Visit: Payer: Self-pay | Admitting: Family Medicine

## 2014-10-23 NOTE — Telephone Encounter (Signed)
REFILL RESPONDED TO BY REFUSAL, PT SEEING GANJI.Marland Kitchen REFILL SENT TO GANJI PER MESSAGE IN CHART.

## 2014-10-27 ENCOUNTER — Other Ambulatory Visit: Payer: Self-pay | Admitting: Family Medicine

## 2014-12-04 ENCOUNTER — Encounter: Payer: Self-pay | Admitting: Family Medicine

## 2014-12-31 ENCOUNTER — Ambulatory Visit: Payer: Medicare Other | Admitting: Family Medicine

## 2015-01-03 ENCOUNTER — Ambulatory Visit (INDEPENDENT_AMBULATORY_CARE_PROVIDER_SITE_OTHER): Payer: Medicare Other | Admitting: Family Medicine

## 2015-01-03 ENCOUNTER — Encounter: Payer: Self-pay | Admitting: Family Medicine

## 2015-01-03 VITALS — BP 118/74 | HR 76 | Temp 97.8°F | Resp 18 | Wt 242.0 lb

## 2015-01-03 DIAGNOSIS — I2510 Atherosclerotic heart disease of native coronary artery without angina pectoris: Secondary | ICD-10-CM | POA: Diagnosis not present

## 2015-01-03 DIAGNOSIS — E119 Type 2 diabetes mellitus without complications: Secondary | ICD-10-CM

## 2015-01-03 DIAGNOSIS — L219 Seborrheic dermatitis, unspecified: Secondary | ICD-10-CM | POA: Diagnosis not present

## 2015-01-03 DIAGNOSIS — G629 Polyneuropathy, unspecified: Secondary | ICD-10-CM

## 2015-01-03 DIAGNOSIS — I10 Essential (primary) hypertension: Secondary | ICD-10-CM | POA: Diagnosis not present

## 2015-01-03 MED ORDER — TRIAMCINOLONE ACETONIDE 0.1 % EX CREA: 1.0000 "application " | TOPICAL_CREAM | Freq: Two times a day (BID) | CUTANEOUS | Status: DC

## 2015-01-03 NOTE — Progress Notes (Signed)
Subjective:    Patient ID: Chris [name], male    DOB: 1940/08/07, 74 y.o.   MRN: [id number]  HPI  patient is here today with several concerns. He has a red scaly rash behind both ears and on his neck near the clavicle. The rash is light pink with fine white scale. There is some flaking in his scalp and along his eyebrows.   He is also due for recheck of his diabetes. His hemoglobin A1c was checked in January found to be 6.6. Since that time he is developed numbness in both feet distal to the metatarsal. He also complains of tingling pain  In both toes. Diabetic foot exam is performed today. Diabetic foot exam is significant for decreased dorsalis pedis pulses bilaterally and also diminished sensation to 10 g monofilament bilaterally although he is able to feel in 4 out of 4 locations bilaterally. He denies any chest pain shortness of breath or dyspnea on exertion. Past Medical History  Diagnosis Date  . Coronary artery disease     a. 1995 s/p CABG x 3 (VG->RCA, LIMA->LAD, VG->OM);  b. 07/2008 Inf MI/Cath/PCI: VG->RCA 99 - treated with Taxus DES (58mm), LIMA and VG->OM patent, LAD 100, LCX 100, RCA 60d, EF 50%;  c. 09/2008 PCI native RCA  w/ 2.5x23 Xience DES, VG->RCA stent patent;  c. 05/2010 Cath: Native 3VD with 3/3 patent grafts, native RCA w 80% ISR prox to graft insertion-->Med Rx.  . RBBB (right bundle branch block)   . Gout   . Hyperlipidemia   . Obesity   . Hypertension   . ED (erectile dysfunction)   . Vertigo   . Umbilical hernia     a. s/p repair.  . 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 Myocardial infarction   . GERD (gastroesophageal reflux disease)     occ  . Arthritis   . Neuropathy     toes  . H/O hiatal hernia   . Type II diabetes mellitus    Past Surgical History  Procedure Laterality Date  . Coronary artery bypass graft  1995    LIMA to LAD, SVG to RCA, SVG to OM.   Marland Kitchen Umbilical hernia repair  12/2007  . Cardiac catheterization  2000   stents x3  . Colonoscopy    . Ankle arthroscopy with drilling/microfracture Left 04/13/2013    Procedure: LEFT ANKLE ARTHROSCOPY WITH EXTENSIVE DEBRIEDMENT;  Surgeon: Wylene Simmer, MD;  Location: [place];  Service: Orthopedics;  Laterality: Left;  . Ankle surgery Left 08/18/2013    DR HEWITT  . Total ankle arthroplasty Left 08/17/2013    Procedure: LEFT TOTAL ANKLE REPLACEMENT WITH POSSIBLE GASTROC RECESSION ;  Surgeon: Wylene Simmer, MD;  Location: [place];  Service: Orthopedics;  Laterality: Left;  . Cardiac catheterization  02/15/2014    Procedure: LEFT HEART CATH AND CORS/GRAFTS ANGIOGRAPHY;  Surgeon: Laverda Page, MD;  Location: [facility] CATH LAB;  Service: Cardiovascular;;   Current Outpatient Prescriptions on File Prior to Visit  Medication Sig Dispense Refill  . allopurinol (ZYLOPRIM) 100 MG tablet TAKE 1 TABLET BY MOUTH EVERY DAY 90 tablet 3  . aspirin EC 81 MG tablet Take 1 tablet (81 mg total) by mouth daily.    Marland Kitchen losartan (COZAAR) 25 MG tablet Take 1 tablet (25 mg total) by mouth daily. 7 tablet 0  . nitroGLYCERIN (NITROSTAT) 0.4 MG SL tablet Place 1 tablet (0.4 mg total) under the tongue every 5 (five)  minutes x 3 doses as needed for chest pain.  12  . pantoprazole (PROTONIX) 40 MG tablet TAKE 1 TABLET BY MOUTH EVERY DAY 30 tablet 11  . rosuvastatin (CRESTOR) 20 MG tablet Take 20 mg by mouth daily.     No current facility-administered medications on file prior to visit.   Allergies  Allergen Reactions  . Lipitor [Atorvastatin] Other (See Comments)    Intolerable muscle pain all over    Social History   Social History  . Marital Status: Married    Spouse Name: N/A  . Number of Children: N/A  . Years of Education: N/A   Occupational History  . Not on file.   Social History Main Topics  . Smoking status: Former Smoker -- 1.00 packs/day for 9 years    Types: Cigarettes    Quit date: 10/31/1970  . Smokeless tobacco: Former Systems developer    Types: Chew     Comment:  chewed for 2 years  . Alcohol Use: No     Comment: occ wine last 6 months  . Drug Use: No  . Sexual Activity: Yes   Other Topics Concern  . Not on file   Social History Narrative   Lives in [place] with wife.  Retired.      Review of Systems  All other systems reviewed and are negative.      Objective:   Physical Exam  Cardiovascular: Normal rate, regular rhythm and normal heart sounds.   No murmur heard. Pulses:      Dorsalis pedis pulses are 1+ on the right side, and 1+ on the left side.       Posterior tibial pulses are 0 on the right side, and 0 on the left side.  Pulmonary/Chest: Effort normal and breath sounds normal. No respiratory distress. He has no wheezes. He has no rales.  Abdominal: Soft. Bowel sounds are normal. He exhibits no distension. There is no tenderness. There is no rebound and no guarding.  Musculoskeletal: He exhibits no edema.  Vitals reviewed.         Assessment & Plan:  Seborrheic dermatitis - Plan: triamcinolone cream (KENALOG) 0.1 %  Diabetes mellitus type II, controlled  ASCVD (arteriosclerotic cardiovascular disease)  Essential hypertension  Neuropathy   I will treat the rash on his head and neck like [place] dermatitis with triamcinolone cream twice a day for 2 weeks. Recheck if no better. I will check a hemoglobin A1c fasting lipid panel and urine microalbumin. I will have the patient return for this lab work. Because of his history of cardiovascular disease, his goal LDL cholesterol is less than 70. His blood pressures well controlled today. At the present time the pain in his feet is not significant enough to warrant gabapentin. However I'm concerned that the patient may have peripheral vascular disease given the diminished pulses in his legs and feet. Therefore I'll schedule the patient for arterial Doppler studies of the legs.

## 2015-01-07 ENCOUNTER — Telehealth: Payer: Self-pay | Admitting: Family Medicine

## 2015-01-07 NOTE — Telephone Encounter (Signed)
Asa 81 mg poqday is adequate

## 2015-01-07 NOTE — Telephone Encounter (Signed)
Pt calling.  Some nurse from his insurance had come to visit him at his home earlier last week.  He saw you on Thursday.  He said the nurse told him he needed to take 325mg  of ASA (he said he mentioned this to you).  He said he took for about 2-3 days.  He said then Friday and Saturday night he had nose bleeds that soaked his pillow!  He has not taken the ASA since then.  Dr Einar Gip had put him on the 81 mg ASA initally.  Told him insurance nurse should be checking with patient's providers before telling pateints to adjust their meds.    He wants your advise on restarting ASA 81 mg.

## 2015-01-08 ENCOUNTER — Other Ambulatory Visit: Payer: Medicare Other

## 2015-01-08 LAB — HEMOGLOBIN A1C
Hgb A1c MFr Bld: 6.8 % — ABNORMAL HIGH (ref <5.7)
Mean Plasma Glucose: 148 mg/dL — ABNORMAL HIGH (ref <117)

## 2015-01-08 LAB — LIPID PANEL
Cholesterol: 164 mg/dL (ref 125–200)
HDL: 34 mg/dL — ABNORMAL LOW (ref >=40)
LDL Cholesterol: 69 mg/dL (ref <130)
Total CHOL/HDL Ratio: 4.8 Ratio (ref <=5.0)
Triglycerides: 305 mg/dL — ABNORMAL HIGH (ref <150)
VLDL: 61 mg/dL — ABNORMAL HIGH (ref <30)

## 2015-01-08 LAB — COMPLETE METABOLIC PANEL WITH GFR
ALT: 24 U/L (ref 9–46)
AST: 27 U/L (ref 10–35)
Albumin: 3.7 g/dL (ref 3.6–5.1)
Alkaline Phosphatase: 48 U/L (ref 40–115)
BUN: 15 mg/dL (ref 7–25)
CO2: 24 mmol/L (ref 20–31)
Calcium: 9.4 mg/dL (ref 8.6–10.3)
Chloride: 106 mmol/L (ref 98–110)
Creat: 1.2 mg/dL — ABNORMAL HIGH (ref 0.70–1.18)
GFR, Est African American: 70 mL/min (ref >=60)
GFR, Est Non African American: 60 mL/min (ref >=60)
Glucose, Bld: 118 mg/dL — ABNORMAL HIGH (ref 70–99)
Potassium: 4.4 mmol/L (ref 3.5–5.3)
Sodium: 141 mmol/L (ref 135–146)
Total Bilirubin: 0.3 mg/dL (ref 0.2–1.2)
Total Protein: 7.5 g/dL (ref 6.1–8.1)

## 2015-01-08 NOTE — Telephone Encounter (Signed)
Spoke to patient.  OK to continue 81 mg ASA.

## 2015-01-09 LAB — MICROALBUMIN, URINE: Microalb, Ur: 0.6 mg/dL (ref <2.0)

## 2015-01-10 ENCOUNTER — Encounter: Payer: Self-pay | Admitting: Family Medicine

## 2015-01-10 ENCOUNTER — Other Ambulatory Visit: Payer: Self-pay | Admitting: Family Medicine

## 2015-01-10 MED ORDER — METFORMIN HCL 500 MG PO TABS: 500.0000 mg | ORAL_TABLET | Freq: Two times a day (BID) | ORAL | Status: DC

## 2015-03-29 ENCOUNTER — Encounter: Payer: Self-pay | Admitting: Family Medicine

## 2015-04-17 ENCOUNTER — Encounter: Payer: Self-pay | Admitting: Family Medicine

## 2015-04-24 ENCOUNTER — Encounter: Payer: Self-pay | Admitting: Family Medicine

## 2015-06-18 ENCOUNTER — Other Ambulatory Visit: Payer: Self-pay | Admitting: Family Medicine

## 2015-07-27 ENCOUNTER — Other Ambulatory Visit: Payer: Self-pay | Admitting: Family Medicine

## 2015-09-10 ENCOUNTER — Other Ambulatory Visit: Payer: Self-pay | Admitting: Family Medicine

## 2015-09-10 NOTE — Telephone Encounter (Signed)
Refill appropriate and filled per protocol. 

## 2015-09-23 DIAGNOSIS — I509 Heart failure, unspecified: Secondary | ICD-10-CM

## 2015-09-23 HISTORY — DX: Heart failure, unspecified: I50.9

## 2015-10-04 ENCOUNTER — Other Ambulatory Visit: Payer: Self-pay | Admitting: Cardiology

## 2015-10-04 ENCOUNTER — Ambulatory Visit
Admission: RE | Admit: 2015-10-04 | Discharge: 2015-10-04 | Disposition: A | Discharge status: Home / Self Care | Payer: Medicare Other | Source: Ambulatory Visit | Attending: Cardiology | Admitting: Cardiology

## 2015-10-04 DIAGNOSIS — R0602 Shortness of breath: Secondary | ICD-10-CM

## 2015-10-17 ENCOUNTER — Encounter: Payer: Self-pay | Admitting: Family Medicine

## 2015-10-17 ENCOUNTER — Other Ambulatory Visit: Payer: Self-pay | Admitting: Family Medicine

## 2015-10-26 ENCOUNTER — Encounter: Payer: Self-pay | Admitting: Family Medicine

## 2015-10-26 DIAGNOSIS — I509 Heart failure, unspecified: Secondary | ICD-10-CM | POA: Insufficient documentation

## 2015-11-01 ENCOUNTER — Telehealth: Payer: Self-pay | Admitting: Family Medicine

## 2015-11-01 MED ORDER — COLCHICINE 0.6 MG PO TABS: ORAL_TABLET | ORAL | Status: DC

## 2015-11-01 NOTE — Telephone Encounter (Signed)
Pt is having a gout flare-up and would like to know if he can increase his gout medication. Please call [phone]

## 2015-11-01 NOTE — Telephone Encounter (Signed)
Call placed to patient.   Reports that he has pain in ankle.   MD made aware and NO given for [place].   Prescription sent to pharmacy.

## 2016-01-02 ENCOUNTER — Other Ambulatory Visit: Payer: Self-pay | Admitting: Family Medicine

## 2016-01-02 NOTE — Telephone Encounter (Signed)
Refill appropriate and filled per protocol. 

## 2016-04-02 ENCOUNTER — Encounter: Payer: Self-pay | Admitting: Family Medicine

## 2016-04-06 ENCOUNTER — Other Ambulatory Visit: Payer: Self-pay | Admitting: Family Medicine

## 2016-04-06 MED ORDER — PANTOPRAZOLE SODIUM 40 MG PO TBEC: 40.0000 mg | DELAYED_RELEASE_TABLET | Freq: Every day | ORAL | 11 refills | Status: DC

## 2016-04-06 MED ORDER — ALLOPURINOL 100 MG PO TABS: 100.0000 mg | ORAL_TABLET | Freq: Every day | ORAL | 3 refills | Status: DC

## 2016-04-06 MED ORDER — PANTOPRAZOLE SODIUM 40 MG PO TBEC: 40.0000 mg | DELAYED_RELEASE_TABLET | Freq: Every day | ORAL | 3 refills | Status: DC

## 2016-04-06 NOTE — Addendum Note (Signed)
Addended by: Shary Decamp B on: 04/06/2016 04:16 PM   Modules accepted: Orders

## 2016-08-06 DIAGNOSIS — E782 Mixed hyperlipidemia: Secondary | ICD-10-CM | POA: Diagnosis not present

## 2016-08-06 DIAGNOSIS — I2510 Atherosclerotic heart disease of native coronary artery without angina pectoris: Secondary | ICD-10-CM | POA: Diagnosis not present

## 2016-08-13 DIAGNOSIS — I2510 Atherosclerotic heart disease of native coronary artery without angina pectoris: Secondary | ICD-10-CM | POA: Diagnosis not present

## 2016-08-13 DIAGNOSIS — E782 Mixed hyperlipidemia: Secondary | ICD-10-CM | POA: Diagnosis not present

## 2016-08-13 DIAGNOSIS — I252 Old myocardial infarction: Secondary | ICD-10-CM | POA: Diagnosis not present

## 2016-08-13 DIAGNOSIS — R0609 Other forms of dyspnea: Secondary | ICD-10-CM | POA: Diagnosis not present

## 2016-09-01 DIAGNOSIS — I739 Peripheral vascular disease, unspecified: Secondary | ICD-10-CM | POA: Diagnosis not present

## 2016-09-29 ENCOUNTER — Other Ambulatory Visit: Payer: Self-pay | Admitting: Family Medicine

## 2016-11-13 ENCOUNTER — Ambulatory Visit (INDEPENDENT_AMBULATORY_CARE_PROVIDER_SITE_OTHER): Payer: Medicare HMO | Admitting: Family Medicine

## 2016-11-13 ENCOUNTER — Encounter: Payer: Self-pay | Admitting: Family Medicine

## 2016-11-13 VITALS — BP 100/60 | HR 58 | Temp 97.8°F | Resp 18 | Ht 71.5 in | Wt 246.0 lb

## 2016-11-13 DIAGNOSIS — S59919A Unspecified injury of unspecified forearm, initial encounter: Secondary | ICD-10-CM | POA: Diagnosis not present

## 2016-11-13 DIAGNOSIS — E1140 Type 2 diabetes mellitus with diabetic neuropathy, unspecified: Secondary | ICD-10-CM

## 2016-11-13 DIAGNOSIS — Z23 Encounter for immunization: Secondary | ICD-10-CM

## 2016-11-13 LAB — COMPLETE METABOLIC PANEL WITH GFR
ALT: 22 U/L (ref 9–46)
AST: 26 U/L (ref 10–35)
Albumin: 4 g/dL (ref 3.6–5.1)
Alkaline Phosphatase: 46 U/L (ref 40–115)
BUN: 16 mg/dL (ref 7–25)
CO2: 23 mmol/L (ref 20–31)
Calcium: 9.4 mg/dL (ref 8.6–10.3)
Chloride: 104 mmol/L (ref 98–110)
Creat: 1.28 mg/dL — ABNORMAL HIGH (ref 0.70–1.18)
GFR, Est African American: 64 mL/min (ref >=60)
GFR, Est Non African American: 55 mL/min — ABNORMAL LOW (ref >=60)
Glucose, Bld: 91 mg/dL (ref 70–99)
Potassium: 4.1 mmol/L (ref 3.5–5.3)
Sodium: 139 mmol/L (ref 135–146)
Total Bilirubin: 0.5 mg/dL (ref 0.2–1.2)
Total Protein: 7.7 g/dL (ref 6.1–8.1)

## 2016-11-13 LAB — CBC WITH DIFFERENTIAL/PLATELET
Basophils Absolute: 87 cells/uL (ref 0–200)
Basophils Relative: 1 %
Eosinophils Absolute: 348 cells/uL (ref 15–500)
Eosinophils Relative: 4 %
HCT: 45.2 % (ref 38.5–50.0)
Hemoglobin: 15 g/dL (ref 13.0–17.0)
Lymphocytes Relative: 33 %
Lymphs Abs: 2871 cells/uL (ref 850–3900)
MCH: 31.2 pg (ref 27.0–33.0)
MCHC: 33.2 g/dL (ref 32.0–36.0)
MCV: 94 fL (ref 80.0–100.0)
MPV: 9.6 fL (ref 7.5–12.5)
Monocytes Absolute: 870 cells/uL (ref 200–950)
Monocytes Relative: 10 %
Neutro Abs: 4524 cells/uL (ref 1500–7800)
Neutrophils Relative %: 52 %
Platelets: 227 10*3/uL (ref 140–400)
RBC: 4.81 MIL/uL (ref 4.20–5.80)
RDW: 14.1 % (ref 11.0–15.0)
WBC: 8.7 10*3/uL (ref 3.8–10.8)

## 2016-11-13 LAB — LIPID PANEL
Cholesterol: 162 mg/dL (ref <200)
HDL: 35 mg/dL — ABNORMAL LOW (ref >40)
LDL Cholesterol: 50 mg/dL (ref <100)
Total CHOL/HDL Ratio: 4.6 Ratio (ref <5.0)
Triglycerides: 385 mg/dL — ABNORMAL HIGH (ref <150)
VLDL: 77 mg/dL — ABNORMAL HIGH (ref <30)

## 2016-11-13 NOTE — Progress Notes (Signed)
Subjective:    Patient ID: Chris [name], male    DOB: [birth year], 76 y.o.   MRN: [id number]  HPI Patient is long overdue for follow-up regarding his diabetes. He is not checking his sugars. He denies any polyuria, polydipsia, or blurry vision. He is due for Prevnar 13. He is also due for a tetanus shot as he recently was stuck in his right bicep by a rusty fishhook. That occurred this morning. Blood pressure is well controlled at 100/60. He denies any chest pain shortness of breath or dyspnea on exertion. He has significant peripheral neuropathy. He essentially has no sensation in his feet to his ankles. See diabetic foot exam. He denies any myalgias or right upper quadrant pain. He has known right internal carotid artery stenosis that is being monitored annually by his cardiologist. Recent vascular studies of the legs were negative for any significant peripheral vascular disease Past Medical History:  Diagnosis Date  . Arthritis   . Congestive heart failure (CHF) ([place]) 5/17   EF 35-40% [name]  . Coronary artery disease    a. 1995 s/p CABG x 3 (VG->RCA, LIMA->LAD, VG->OM);  b. 07/2008 Inf MI/Cath/PCI: VG->RCA 99 - treated with Taxus DES (57mm), LIMA and VG->OM patent, LAD 100, LCX 100, RCA 60d, EF 50%;  c. 09/2008 PCI native RCA  w/ 2.5x23 Xience DES, VG->RCA stent patent;  c. 05/2010 Cath: Native 3VD with 3/3 patent grafts, native RCA w 80% ISR prox to graft insertion-->Med Rx.  . ED (erectile dysfunction)   . GERD (gastroesophageal reflux disease)    occ  . Gout   . H/O hiatal hernia   . Hyperlipidemia   . Hypertension   . Myocardial infarction ([place])   . Neuropathy    toes  . Obesity   . RBBB (right bundle branch block)   . Stenosis of right internal carotid artery    50-69% (2014)  . Type II diabetes mellitus ([place])   . Umbilical hernia    a. s/p repair.  . Vertigo    Past Surgical History:  Procedure Laterality Date  . ANKLE ARTHROSCOPY WITH DRILLING/MICROFRACTURE Left  04/13/2013   Procedure: LEFT ANKLE ARTHROSCOPY WITH EXTENSIVE DEBRIEDMENT;  Surgeon: Wylene Simmer, MD;  Location: [facility];  Service: Orthopedics;  Laterality: Left;  . ANKLE SURGERY Left 08/18/2013   DR HEWITT  . CARDIAC CATHETERIZATION  2000   stents x3  . CARDIAC CATHETERIZATION  02/15/2014   Procedure: LEFT HEART CATH AND CORS/GRAFTS ANGIOGRAPHY;  Surgeon: Laverda Page, MD;  Location: [facility] CATH LAB;  Service: Cardiovascular;;  . COLONOSCOPY    . CORONARY ARTERY BYPASS GRAFT  1995   LIMA to LAD, SVG to RCA, SVG to OM.   Marland Kitchen TOTAL ANKLE ARTHROPLASTY Left 08/17/2013   Procedure: LEFT TOTAL ANKLE REPLACEMENT WITH POSSIBLE GASTROC RECESSION ;  Surgeon: Wylene Simmer, MD;  Location: [facility];  Service: Orthopedics;  Laterality: Left;  . UMBILICAL HERNIA REPAIR  12/2007   Current Outpatient Prescriptions on File Prior to Visit  Medication Sig Dispense Refill  . allopurinol (ZYLOPRIM) 100 MG tablet Take 1 tablet (100 mg total) by mouth daily. 90 tablet 3  . aspirin EC 81 MG tablet Take 1 tablet (81 mg total) by mouth daily.    . colchicine (COLCRYS) 0.6 MG tablet Take (2) tabs PO on day 1, then take 1 tab PO QD x5 days. 7 tablet 0  . losartan (COZAAR) 25 MG tablet Take 1 tablet (25 mg total)  by mouth daily. 7 tablet 0  . nitroGLYCERIN (NITROSTAT) 0.4 MG SL tablet Place 1 tablet (0.4 mg total) under the tongue every 5 (five) minutes x 3 doses as needed for chest pain.  12  . pantoprazole (PROTONIX) 40 MG tablet Take 1 tablet (40 mg total) by mouth daily. 90 tablet 3  . metFORMIN (GLUCOPHAGE) 500 MG tablet Take 1 tablet (500 mg total) by mouth 2 (two) times daily with a meal. (Patient not taking: Reported on 11/13/2016) 60 tablet 3   No current facility-administered medications on file prior to visit.    Allergies  Allergen Reactions  . Lipitor [Atorvastatin] Other (See Comments)    Intolerable muscle pain all over    Social History   Social History  . Marital status: Married     Spouse name: N/A  . Number of children: N/A  . Years of education: N/A   Occupational History  . Not on file.   Social History Main Topics  . Smoking status: Former Smoker    Packs/day: 1.00    Years: 9.00    Types: Cigarettes    Quit date: 10/31/1970  . Smokeless tobacco: Former Systems developer    Types: Chew     Comment: chewed for 2 years  . Alcohol use No     Comment: occ wine last 6 months  . Drug use: No  . Sexual activity: Yes   Other Topics Concern  . Not on file   Social History Narrative   Lives in [place] with wife.  Retired.      Review of Systems  All other systems reviewed and are negative.      Objective:   Physical Exam  Constitutional: He appears well-developed and well-nourished. No distress.  Neck: Neck supple. No JVD present. No thyromegaly present.  Cardiovascular: Normal rate, regular rhythm, normal heart sounds and intact distal pulses.  Exam reveals no gallop and no friction rub.   No murmur heard. Pulses:      Dorsalis pedis pulses are 1+ on the right side, and 1+ on the left side.       Posterior tibial pulses are 1+ on the right side, and 1+ on the left side.  Pulmonary/Chest: Effort normal and breath sounds normal. No respiratory distress. He has no wheezes. He has no rales.  Abdominal: Soft. Bowel sounds are normal. He exhibits no distension and no mass. There is no tenderness. There is no rebound and no guarding.  Musculoskeletal: He exhibits no edema.  Lymphadenopathy:    He has no cervical adenopathy.  Skin: He is not diaphoretic.  Vitals reviewed.         Assessment & Plan:  Controlled type 2 diabetes mellitus with diabetic neuropathy, without long-term current use of insulin ([place]) - Plan: CBC with Differential/Platelet, COMPLETE METABOLIC PANEL WITH GFR, Lipid panel, Microalbumin, urine, Hemoglobin A1c Patient received Prevnar 13. Because of the rusty fishhook, he received a tetanus shot. Diabetic foot exam was performed. Encouraged  the patient to schedule his annual diabetic eye exam. We'll check a hemoglobin A1c. Goal hemoglobin A1c is less than 6.5. We'll also check a fasting lipid panel. Goal LDL cholesterol is less than 70. Check a urine microalbumin.

## 2016-11-14 LAB — MICROALBUMIN, URINE: Microalb, Ur: 1.3 mg/dL

## 2016-11-14 LAB — HEMOGLOBIN A1C
Hgb A1c MFr Bld: 6.5 % — ABNORMAL HIGH (ref <5.7)
Mean Plasma Glucose: 140 mg/dL

## 2016-11-16 NOTE — Addendum Note (Signed)
Addended by: Shary Decamp B on: 11/16/2016 02:57 PM   Modules accepted: Orders

## 2016-11-18 ENCOUNTER — Encounter: Payer: Self-pay | Admitting: Family Medicine

## 2016-11-18 ENCOUNTER — Other Ambulatory Visit: Payer: Self-pay | Admitting: Family Medicine

## 2016-11-18 MED ORDER — METFORMIN HCL 500 MG PO TABS: 500.0000 mg | ORAL_TABLET | Freq: Two times a day (BID) | ORAL | 3 refills | Status: DC

## 2016-12-31 DIAGNOSIS — H2513 Age-related nuclear cataract, bilateral: Secondary | ICD-10-CM | POA: Diagnosis not present

## 2016-12-31 DIAGNOSIS — E119 Type 2 diabetes mellitus without complications: Secondary | ICD-10-CM | POA: Diagnosis not present

## 2016-12-31 DIAGNOSIS — H25013 Cortical age-related cataract, bilateral: Secondary | ICD-10-CM | POA: Diagnosis not present

## 2016-12-31 DIAGNOSIS — H524 Presbyopia: Secondary | ICD-10-CM | POA: Diagnosis not present

## 2016-12-31 DIAGNOSIS — H52203 Unspecified astigmatism, bilateral: Secondary | ICD-10-CM | POA: Diagnosis not present

## 2016-12-31 LAB — HM DIABETES EYE EXAM

## 2017-01-18 ENCOUNTER — Encounter: Payer: Self-pay | Admitting: Family Medicine

## 2017-02-10 DIAGNOSIS — R0609 Other forms of dyspnea: Secondary | ICD-10-CM | POA: Diagnosis not present

## 2017-02-10 DIAGNOSIS — I2510 Atherosclerotic heart disease of native coronary artery without angina pectoris: Secondary | ICD-10-CM | POA: Diagnosis not present

## 2017-02-10 DIAGNOSIS — I6521 Occlusion and stenosis of right carotid artery: Secondary | ICD-10-CM | POA: Diagnosis not present

## 2017-02-10 DIAGNOSIS — I252 Old myocardial infarction: Secondary | ICD-10-CM | POA: Diagnosis not present

## 2017-04-01 DIAGNOSIS — I6523 Occlusion and stenosis of bilateral carotid arteries: Secondary | ICD-10-CM | POA: Diagnosis not present

## 2017-04-21 ENCOUNTER — Other Ambulatory Visit: Payer: Self-pay | Admitting: Family Medicine

## 2017-04-22 NOTE — Telephone Encounter (Signed)
Medication refilled per protocol. 

## 2017-05-06 ENCOUNTER — Telehealth: Payer: Self-pay | Admitting: Family Medicine

## 2017-05-06 DIAGNOSIS — L219 Seborrheic dermatitis, unspecified: Secondary | ICD-10-CM

## 2017-05-06 MED ORDER — TRIAMCINOLONE ACETONIDE 0.1 % EX CREA: 1.0000 "application " | TOPICAL_CREAM | Freq: Two times a day (BID) | CUTANEOUS | 0 refills | Status: DC

## 2017-05-06 NOTE — Telephone Encounter (Signed)
Pt needs refill on Triamcinolone acetonide sent to [facility] in [place] on [address].

## 2017-05-06 NOTE — Telephone Encounter (Signed)
Medication called/sent to requested pharmacy  

## 2017-05-17 ENCOUNTER — Ambulatory Visit: Payer: Self-pay | Admitting: Family Medicine

## 2017-06-28 ENCOUNTER — Other Ambulatory Visit: Payer: Self-pay | Admitting: Family Medicine

## 2017-09-22 ENCOUNTER — Other Ambulatory Visit: Payer: Self-pay | Admitting: Family Medicine

## 2017-09-23 ENCOUNTER — Encounter: Payer: Self-pay | Admitting: Family Medicine

## 2017-09-23 ENCOUNTER — Ambulatory Visit (INDEPENDENT_AMBULATORY_CARE_PROVIDER_SITE_OTHER): Payer: Medicare HMO | Admitting: Family Medicine

## 2017-09-23 VITALS — BP 126/70 | HR 76 | Temp 98.0°F | Resp 16 | Ht 71.5 in | Wt 242.0 lb

## 2017-09-23 DIAGNOSIS — M10062 Idiopathic gout, left knee: Secondary | ICD-10-CM | POA: Diagnosis not present

## 2017-09-23 MED ORDER — COLCHICINE 0.6 MG PO TABS: ORAL_TABLET | ORAL | 0 refills | Status: DC

## 2017-09-23 NOTE — Progress Notes (Signed)
Subjective:     Patient ID: Chris [name], male   DOB: 1940-06-16, 77 y.o.   MRN: [id number]  HPI Patient presents with severe pain in left knee over lateral joint line.  Denies falls or injury or cuts or penetrating wounds to the knee.  Knee is swollen with effusion.  Warm to touch.  Hurts to bear weight.  Past Medical History:  Diagnosis Date  . Arthritis   . Congestive heart failure (CHF) ([place]) 5/17   EF 35-40% [name]  . Coronary artery disease    a. 1995 s/p CABG x 3 (VG->RCA, LIMA->LAD, VG->OM);  b. 07/2008 Inf MI/Cath/PCI: VG->RCA 99 - treated with Taxus DES (59mm), LIMA and VG->OM patent, LAD 100, LCX 100, RCA 60d, EF 50%;  c. 09/2008 PCI native RCA  w/ 2.5x23 Xience DES, VG->RCA stent patent;  c. 05/2010 Cath: Native 3VD with 3/3 patent grafts, native RCA w 80% ISR prox to graft insertion-->Med Rx.  . ED (erectile dysfunction)   . GERD (gastroesophageal reflux disease)    occ  . Gout   . H/O hiatal hernia   . Hyperlipidemia   . Hypertension   . Myocardial infarction ([place])   . Neuropathy    toes  . Obesity   . RBBB (right bundle branch block)   . Stenosis of right internal carotid artery    50-69% (2014)  . Type II diabetes mellitus ([place])   . Umbilical hernia    a. s/p repair.  . Vertigo    Past Surgical History:  Procedure Laterality Date  . ANKLE ARTHROSCOPY WITH DRILLING/MICROFRACTURE Left 04/13/2013   Procedure: LEFT ANKLE ARTHROSCOPY WITH EXTENSIVE DEBRIEDMENT;  Surgeon: Wylene Simmer, MD;  Location: [facility];  Service: Orthopedics;  Laterality: Left;  . ANKLE SURGERY Left 08/18/2013   DR HEWITT  . CARDIAC CATHETERIZATION  2000   stents x3  . CARDIAC CATHETERIZATION  02/15/2014   Procedure: LEFT HEART CATH AND CORS/GRAFTS ANGIOGRAPHY;  Surgeon: Laverda Page, MD;  Location[facility] CATH LAB;  Service: Cardiovascular;;  . COLONOSCOPY    . CORONARY ARTERY BYPASS GRAFT  1995   LIMA to LAD, SVG to RCA, SVG to OM.   Marland Kitchen TOTAL ANKLE ARTHROPLASTY  Left 08/17/2013   Procedure: LEFT TOTAL ANKLE REPLACEMENT WITH POSSIBLE GASTROC RECESSION ;  Surgeon: Wylene Simmer, MD;  Location: [facility];  Service: Orthopedics;  Laterality: Left;  . UMBILICAL HERNIA REPAIR  12/2007   Current Outpatient Medications on File Prior to Visit  Medication Sig Dispense Refill  . allopurinol (ZYLOPRIM) 100 MG tablet TAKE 1 TABLET EVERY DAY 90 tablet 0  . aspirin EC 81 MG tablet Take 1 tablet (81 mg total) by mouth daily.    Marland Kitchen losartan (COZAAR) 25 MG tablet Take 1 tablet (25 mg total) by mouth daily. 7 tablet 0  . metFORMIN (GLUCOPHAGE) 500 MG tablet TAKE 1 TABLET (500 MG TOTAL) BY MOUTH 2 (TWO) TIMES DAILY WITH A MEAL. 180 tablet 3  . metoprolol tartrate (LOPRESSOR) 25 MG tablet Take 25 mg by mouth daily.     . nitroGLYCERIN (NITROSTAT) 0.4 MG SL tablet Place 1 tablet (0.4 mg total) under the tongue every 5 (five) minutes x 3 doses as needed for chest pain.  12  . Omega-3 Fatty Acids (FISH OIL PEARLS PO) Take 500 mg by mouth daily.    . pantoprazole (PROTONIX) 40 MG tablet TAKE 1 TABLET EVERY DAY 90 tablet 0  . rosuvastatin (CRESTOR) 40 MG tablet     .  triamcinolone cream (KENALOG) 0.1 % Apply 1 application topically 2 (two) times daily. 30 g 0   No current facility-administered medications on file prior to visit.    Allergies  Allergen Reactions  . Lipitor [Atorvastatin] Other (See Comments)    Intolerable muscle pain all over    Social History   Socioeconomic History  . Marital status: Married    Spouse name: Not on file  . Number of children: Not on file  . Years of education: Not on file  . Highest education level: Not on file  Occupational History  . Not on file  Social Needs  . Financial resource strain: Not on file  . Food insecurity:    Worry: Not on file    Inability: Not on file  . Transportation needs:    Medical: Not on file    Non-medical: Not on file  Tobacco Use  . Smoking status: Former Smoker    Packs/day: 1.00    Years: 9.00     Pack years: 9.00    Types: Cigarettes    Last attempt to quit: 10/31/1970    Years since quitting: 46.9  . Smokeless tobacco: Former Systems developer    Types: Chew  . Tobacco comment: chewed for 2 years  Substance and Sexual Activity  . Alcohol use: No    Comment: occ wine last 6 months  . Drug use: No  . Sexual activity: Yes  Lifestyle  . Physical activity:    Days per week: Not on file    Minutes per session: Not on file  . Stress: Not on file  Relationships  . Social connections:    Talks on phone: Not on file    Gets together: Not on file    Attends religious service: Not on file    Active member of club or organization: Not on file    Attends meetings of clubs or organizations: Not on file    Relationship status: Not on file  . Intimate partner violence:    Fear of current or ex partner: Not on file    Emotionally abused: Not on file    Physically abused: Not on file    Forced sexual activity: Not on file  Other Topics Concern  . Not on file  Social History Narrative   Lives in [place] with wife.  Retired.     Review of Systems  All other systems reviewed and are negative.      Objective:   Physical Exam  Constitutional: He appears well-developed and well-nourished.  Cardiovascular: Normal rate and regular rhythm.  Pulmonary/Chest: Effort normal and breath sounds normal.  Musculoskeletal:       Left knee: He exhibits decreased range of motion, swelling and effusion. Tenderness found. Lateral joint line tenderness noted.  Vitals reviewed.      Assessment:     Acute idiopathic gout of left knee      Plan:     Start colchicine 1.2 mg immediately and then 0.6 mg again in 1 hour.  Repeat daily until better.  Follow up if no better in 2 days sooner if worse.  Overdue for diabetes check.  Encouraged him to follow up for that asap.

## 2017-09-24 ENCOUNTER — Other Ambulatory Visit: Payer: Medicare HMO

## 2017-09-24 ENCOUNTER — Ambulatory Visit: Payer: Medicare HMO | Admitting: Family Medicine

## 2017-09-24 DIAGNOSIS — I10 Essential (primary) hypertension: Secondary | ICD-10-CM | POA: Diagnosis not present

## 2017-09-24 DIAGNOSIS — I509 Heart failure, unspecified: Secondary | ICD-10-CM | POA: Diagnosis not present

## 2017-09-24 DIAGNOSIS — E118 Type 2 diabetes mellitus with unspecified complications: Secondary | ICD-10-CM

## 2017-09-24 DIAGNOSIS — Z79899 Other long term (current) drug therapy: Secondary | ICD-10-CM | POA: Diagnosis not present

## 2017-09-24 DIAGNOSIS — E785 Hyperlipidemia, unspecified: Secondary | ICD-10-CM | POA: Diagnosis not present

## 2017-09-24 LAB — COMPREHENSIVE METABOLIC PANEL
AG Ratio: 1.1 (calc) (ref 1.0–2.5)
ALT: 18 U/L (ref 9–46)
AST: 30 U/L (ref 10–35)
Albumin: 4.2 g/dL (ref 3.6–5.1)
Alkaline phosphatase (APISO): 49 U/L (ref 40–115)
BUN/Creatinine Ratio: 14 (calc) (ref 6–22)
BUN: 17 mg/dL (ref 7–25)
CO2: 26 mmol/L (ref 20–32)
Calcium: 9.7 mg/dL (ref 8.6–10.3)
Chloride: 102 mmol/L (ref 98–110)
Creat: 1.22 mg/dL — ABNORMAL HIGH (ref 0.70–1.18)
Globulin: 3.8 g/dL (calc) — ABNORMAL HIGH (ref 1.9–3.7)
Glucose, Bld: 129 mg/dL — ABNORMAL HIGH (ref 65–99)
Potassium: 4.6 mmol/L (ref 3.5–5.3)
Sodium: 139 mmol/L (ref 135–146)
Total Bilirubin: 0.6 mg/dL (ref 0.2–1.2)
Total Protein: 8 g/dL (ref 6.1–8.1)

## 2017-09-24 LAB — LIPID PANEL
Cholesterol: 147 mg/dL (ref <200)
HDL: 36 mg/dL — ABNORMAL LOW (ref >40)
LDL Cholesterol (Calc): 78 mg/dL (calc)
Non-HDL Cholesterol (Calc): 111 mg/dL (calc) (ref <130)
Total CHOL/HDL Ratio: 4.1 (calc) (ref <5.0)
Triglycerides: 252 mg/dL — ABNORMAL HIGH (ref <150)

## 2017-09-25 LAB — CBC WITH DIFFERENTIAL/PLATELET
Basophils Absolute: 62 cells/uL (ref 0–200)
Basophils Relative: 0.8 %
Eosinophils Absolute: 328 cells/uL (ref 15–500)
Eosinophils Relative: 4.2 %
HCT: 45.1 % (ref 38.5–50.0)
Hemoglobin: 15.5 g/dL (ref 13.2–17.1)
Lymphs Abs: 2605 cells/uL (ref 850–3900)
MCH: 31.1 pg (ref 27.0–33.0)
MCHC: 34.4 g/dL (ref 32.0–36.0)
MCV: 90.4 fL (ref 80.0–100.0)
MPV: 9.9 fL (ref 7.5–12.5)
Monocytes Relative: 8.9 %
Neutro Abs: 4111 cells/uL (ref 1500–7800)
Neutrophils Relative %: 52.7 %
Platelets: 229 10*3/uL (ref 140–400)
RBC: 4.99 10*6/uL (ref 4.20–5.80)
RDW: 13.2 % (ref 11.0–15.0)
Total Lymphocyte: 33.4 %
WBC mixed population: 694 cells/uL (ref 200–950)
WBC: 7.8 10*3/uL (ref 3.8–10.8)

## 2017-09-25 LAB — HEMOGLOBIN A1C
Hgb A1c MFr Bld: 6.6 % of total Hgb — ABNORMAL HIGH (ref <5.7)
Mean Plasma Glucose: 143 (calc)
eAG (mmol/L): 7.9 (calc)

## 2017-09-27 ENCOUNTER — Telehealth: Payer: Self-pay | Admitting: Family Medicine

## 2017-09-27 DIAGNOSIS — M25562 Pain in left knee: Secondary | ICD-10-CM

## 2017-09-27 NOTE — Telephone Encounter (Signed)
Left message return call to see if patient's knee is doing any better.

## 2017-09-27 NOTE — Telephone Encounter (Signed)
I will fill it out.  Please get one an put it on my desk.

## 2017-09-27 NOTE — Telephone Encounter (Signed)
Is he no better?  [place] with [facility] consult.

## 2017-09-27 NOTE — Telephone Encounter (Signed)
Pt needs handicap placard filled out, and wants referral to [facility] to see dr. Doran Durand, for knee cap.

## 2017-09-27 NOTE — Telephone Encounter (Signed)
Form filled out signed and left up front - pt aware to p/u

## 2017-09-27 NOTE — Telephone Encounter (Signed)
Spoke with patient and patient stated that knee is no better. Feels like knee is popping referral placed to [facility] to see Dr.Hewitt per patient request. Patient still request handicap placard.

## 2017-09-29 NOTE — Telephone Encounter (Signed)
Spoke with patient and informed him that form was ready. Patient verbalized understanding.

## 2017-10-01 DIAGNOSIS — M25562 Pain in left knee: Secondary | ICD-10-CM | POA: Diagnosis not present

## 2017-10-12 DIAGNOSIS — H10501 Unspecified blepharoconjunctivitis, right eye: Secondary | ICD-10-CM | POA: Diagnosis not present

## 2017-11-04 ENCOUNTER — Telehealth: Payer: Self-pay | Admitting: Family Medicine

## 2017-11-04 MED ORDER — ALLOPURINOL 100 MG PO TABS: 100.0000 mg | ORAL_TABLET | Freq: Every day | ORAL | 3 refills | Status: DC

## 2017-11-04 MED ORDER — PANTOPRAZOLE SODIUM 40 MG PO TBEC: 40.0000 mg | DELAYED_RELEASE_TABLET | Freq: Every day | ORAL | 3 refills | Status: DC

## 2017-11-04 NOTE — Telephone Encounter (Signed)
Medication called/sent to requested pharmacy  

## 2017-11-04 NOTE — Telephone Encounter (Signed)
Pt needs refill on allopurinol and pantoprazole to [facility] order.

## 2018-01-31 DIAGNOSIS — Z7984 Long term (current) use of oral hypoglycemic drugs: Secondary | ICD-10-CM | POA: Diagnosis not present

## 2018-01-31 DIAGNOSIS — H2513 Age-related nuclear cataract, bilateral: Secondary | ICD-10-CM | POA: Diagnosis not present

## 2018-01-31 DIAGNOSIS — H25013 Cortical age-related cataract, bilateral: Secondary | ICD-10-CM | POA: Diagnosis not present

## 2018-01-31 DIAGNOSIS — H524 Presbyopia: Secondary | ICD-10-CM | POA: Diagnosis not present

## 2018-01-31 DIAGNOSIS — H52203 Unspecified astigmatism, bilateral: Secondary | ICD-10-CM | POA: Diagnosis not present

## 2018-01-31 DIAGNOSIS — E119 Type 2 diabetes mellitus without complications: Secondary | ICD-10-CM | POA: Diagnosis not present

## 2018-02-09 DIAGNOSIS — I252 Old myocardial infarction: Secondary | ICD-10-CM | POA: Diagnosis not present

## 2018-02-09 DIAGNOSIS — I6521 Occlusion and stenosis of right carotid artery: Secondary | ICD-10-CM | POA: Diagnosis not present

## 2018-02-09 DIAGNOSIS — I2510 Atherosclerotic heart disease of native coronary artery without angina pectoris: Secondary | ICD-10-CM | POA: Diagnosis not present

## 2018-02-09 DIAGNOSIS — I452 Bifascicular block: Secondary | ICD-10-CM | POA: Diagnosis not present

## 2018-03-10 DIAGNOSIS — E782 Mixed hyperlipidemia: Secondary | ICD-10-CM | POA: Diagnosis not present

## 2018-03-10 DIAGNOSIS — I2510 Atherosclerotic heart disease of native coronary artery without angina pectoris: Secondary | ICD-10-CM | POA: Diagnosis not present

## 2018-03-10 DIAGNOSIS — E119 Type 2 diabetes mellitus without complications: Secondary | ICD-10-CM | POA: Diagnosis not present

## 2018-03-10 DIAGNOSIS — R0609 Other forms of dyspnea: Secondary | ICD-10-CM | POA: Diagnosis not present

## 2018-03-15 DIAGNOSIS — I6521 Occlusion and stenosis of right carotid artery: Secondary | ICD-10-CM | POA: Diagnosis not present

## 2018-03-15 DIAGNOSIS — R0602 Shortness of breath: Secondary | ICD-10-CM | POA: Diagnosis not present

## 2018-03-21 DIAGNOSIS — I2510 Atherosclerotic heart disease of native coronary artery without angina pectoris: Secondary | ICD-10-CM | POA: Diagnosis not present

## 2018-03-21 DIAGNOSIS — I252 Old myocardial infarction: Secondary | ICD-10-CM | POA: Diagnosis not present

## 2018-03-21 DIAGNOSIS — I5042 Chronic combined systolic (congestive) and diastolic (congestive) heart failure: Secondary | ICD-10-CM | POA: Diagnosis not present

## 2018-03-21 DIAGNOSIS — I452 Bifascicular block: Secondary | ICD-10-CM | POA: Diagnosis not present

## 2018-04-11 ENCOUNTER — Encounter: Payer: Self-pay | Admitting: Family Medicine

## 2018-04-11 DIAGNOSIS — R0602 Shortness of breath: Secondary | ICD-10-CM | POA: Diagnosis not present

## 2018-04-14 ENCOUNTER — Encounter: Payer: Self-pay | Admitting: Family Medicine

## 2018-04-14 DIAGNOSIS — R9439 Abnormal result of other cardiovascular function study: Secondary | ICD-10-CM | POA: Insufficient documentation

## 2018-04-14 DIAGNOSIS — I2510 Atherosclerotic heart disease of native coronary artery without angina pectoris: Secondary | ICD-10-CM | POA: Diagnosis not present

## 2018-04-14 DIAGNOSIS — R0602 Shortness of breath: Secondary | ICD-10-CM | POA: Diagnosis not present

## 2018-04-14 DIAGNOSIS — I5042 Chronic combined systolic (congestive) and diastolic (congestive) heart failure: Secondary | ICD-10-CM | POA: Diagnosis not present

## 2018-04-14 DIAGNOSIS — I252 Old myocardial infarction: Secondary | ICD-10-CM | POA: Diagnosis not present

## 2018-07-13 ENCOUNTER — Encounter: Payer: Self-pay | Admitting: Cardiology

## 2018-07-13 ENCOUNTER — Ambulatory Visit: Payer: Medicare HMO | Admitting: Cardiology

## 2018-07-13 VITALS — BP 116/72 | HR 58 | Ht 71.0 in | Wt 240.0 lb

## 2018-07-13 DIAGNOSIS — I6521 Occlusion and stenosis of right carotid artery: Secondary | ICD-10-CM

## 2018-07-13 DIAGNOSIS — R0602 Shortness of breath: Secondary | ICD-10-CM

## 2018-07-13 DIAGNOSIS — I252 Old myocardial infarction: Secondary | ICD-10-CM

## 2018-07-13 NOTE — Progress Notes (Signed)
Subjective:   @Patient  ID: Chris [name], male    DOB: [birth year], 78 y.o.   MRN: [id number]  Chief Complaint  Patient presents with  . 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   Patient had a coronary angiogram on 01/2014 which demonstrated native LAD 100, LCX 100, patent grafts and also patent stents. He underwent nuclear stress test on 04/11/2018 which had revealed large inferolateral scar with very minimal peri-infarct ischemia with EF 38% considered to be high risk, but clinically I felt it was low risk and recommended aggressive medical therapy. I'd seen him 3 months ago, he had lost 8 pounds and was feeling well.  He now presents for repeat visit in 3 months.  States that his dyspnea is now stabilized.  No PND or orthopnea, no leg edema.  He still continues to have occasional cramping in his legs with activity.  He has tingling and numbness in his feet due to diabetes.   Past Medical History:  Diagnosis Date  . Abnormal exercise myocardial perfusion study    03/2018- 38% ef, see report  . Arthritis   . Chronic kidney disease   . Congestive heart failure (CHF) ([place]) 5/17   EF 35-40% [name]  . Coronary artery disease    a. 1995 s/p CABG x 3 (VG->RCA, LIMA->LAD, VG->OM);  b. 07/2008 Inf MI/Cath/PCI: VG->RCA 99 - treated with Taxus DES (76m), LIMA and VG->OM patent, LAD 100, LCX 100, RCA 60d, EF 50%;  c. 09/2008 PCI native RCA  w/ 2.5x23 Xience DES, VG->RCA stent patent;  c. 05/2010 Cath: Native 3VD with 3/3 patent grafts, native RCA w 80% ISR prox to graft insertion-->Med Rx.  . DOE (dyspnea on exertion)   . ED (erectile  dysfunction)   . GERD (gastroesophageal reflux disease)    occ  . Gout   . H/O hiatal hernia   . Hyperlipidemia   . Hypertension   . [name]   . Neuropathy    toes  . Obesity   . RBBB (right bundle branch block)   . Stenosis of right internal carotid artery    50-69% (2014)  . Type II diabetes mellitus ([place]   . Umbilical hernia    a. s/p repair.  . Vertigo     Past Surgical History:  Procedure Laterality Date  . ANKLE ARTHROSCOPY WITH DRILLING/MICROFRACTURE Left 04/13/2013   Procedure: LEFT ANKLE ARTHROSCOPY WITH EXTENSIVE DEBRIEDMENT;  Surgeon: JWylene Simmer MD;  [place] [facility]  Service: Orthopedics;  Laterality: Left;  . ANKLE SURGERY Left 08/18/2013   DR HEWITT  . CARDIAC CATHETERIZATION  2000   stents x3  . CARDIAC CATHETERIZATION  02/15/2014   Procedure: LEFT HEART CATH AND CORS/GRAFTS ANGIOGRAPHY;  Surgeon: JLaverda Page MD;  [facility];  Service: Cardiovascular;;  . COLONOSCOPY    . CORONARY ARTERY BYPASS GRAFT  1995   LIMA to LAD, SVG to RCA, SVG to OM.   .Marland KitchenTOTAL ANKLE ARTHROPLASTY Left 08/17/2013   Procedure: LEFT TOTAL ANKLE REPLACEMENT WITH POSSIBLE GASTROC RECESSION ;  Surgeon: JWylene Simmer MD;  [place] [facility]  Service: Orthopedics;  Laterality: Left;  . UMBILICAL HERNIA REPAIR  12/2007    Social History   Socioeconomic History  . Marital status: Married    Spouse name: Not on file  . Number of children: Not on file  . Years of education: Not on file  . Highest education level: Not on file  Occupational History  . Not on file  Social Needs  . Financial resource strain: Not on file  . Food insecurity:    Worry: Not on file    Inability: Not on file  . Transportation needs:    Medical: Not on file    Non-medical: Not on file  Tobacco Use  . Smoking status: Former Smoker    Packs/day: 1.00    Years: 9.00    Pack years: 9.00    Types: Cigarettes    Last attempt to quit: 10/31/1970    Years  since quitting: 47.7  . Smokeless tobacco: Former Systems developer    Types: Chew  . Tobacco comment: chewed for 2 years  Substance and Sexual Activity  . Alcohol use: No    Comment: occ wine last 6 months  . Drug use: No  . Sexual activity: Yes  Lifestyle  . Physical activity:    Days per week: Not on file    Minutes per session: Not on file  . Stress: Not on file  Relationships  . Social connections:    Talks on phone: Not on file    Gets together: Not on file    Attends religious service: Not on file    Active member of club or organization: Not on file    Attends meetings of clubs or organizations: Not on file    Relationship status: Not on file  . Intimate partner violence:    Fear of current or ex partner: Not on file    Emotionally abused: Not on file    Physically abused: Not on file    Forced sexual activity: Not on file  Other Topics Concern  . Not on file  Social History Narrative   Lives in [place] with wife.  Retired.    Current Outpatient Medications on File Prior to Visit  Medication Sig Dispense Refill  . allopurinol (ZYLOPRIM) 100 MG tablet Take 1 tablet (100 mg total) by mouth daily. 90 tablet 3  . aspirin EC 81 MG tablet Take 1 tablet (81 mg total) by mouth daily.    Marland Kitchen losartan (COZAAR) 25 MG tablet Take 1 tablet (25 mg total) by mouth daily. 7 tablet 0  . Omega-3 Fatty Acids (FISH OIL PEARLS PO) Take 500 mg by mouth daily.    . pantoprazole (PROTONIX) 40 MG tablet Take 1 tablet (40 mg total) by mouth daily. 90 tablet 3  . rosuvastatin (CRESTOR) 40 MG tablet     . metFORMIN (GLUCOPHAGE) 500 MG tablet TAKE 1 TABLET (500 MG TOTAL) BY MOUTH 2 (TWO) TIMES DAILY WITH A MEAL. 180 tablet 3  . nitroGLYCERIN (NITROSTAT) 0.4 MG SL tablet Place 1 tablet (0.4 mg total) under the tongue every 5 (five) minutes x 3 doses as needed for chest pain. (Patient not taking: Reported on 07/13/2018)  12   No current facility-administered medications on file prior to visit.      Review of Systems  Constitutional: Negative for unexpected weight change.  HENT: Negative for congestion.   Eyes: Negative for visual disturbance.  Respiratory: Positive for shortness of breath (stable). Negative for chest tightness.   Cardiovascular: Negative for  chest pain.       Leg cramps and claudication, stable  Gastrointestinal: Negative for abdominal pain, nausea and vomiting.  Endocrine: Negative for cold intolerance.  Genitourinary: Negative for dysuria.  Musculoskeletal: Positive for arthralgias. Negative for myalgias.  Skin: Negative for rash.  Allergic/Immunologic: Negative for immunocompromised state.  Neurological: Positive for numbness (feet). Negative for dizziness.  Hematological: Does not bruise/bleed easily.  Psychiatric/Behavioral: The patient is not nervous/anxious.   All other systems reviewed and are negative.      Objective:   Physical Exam Vitals signs and nursing note reviewed.  Constitutional:      General: He is not in acute distress.    Appearance: Normal appearance. He is obese. He is not toxic-appearing.  HENT:     Head: Normocephalic and atraumatic.     Nose: No congestion.     Mouth/Throat:     Mouth: Mucous membranes are dry.  Eyes:     Pupils: Pupils are equal, round, and reactive to light.  Neck:     Musculoskeletal: Neck supple. No muscular tenderness.  Cardiovascular:     Pulses:          Carotid pulses are 2+ on the right side and 2+ on the left side.      Radial pulses are 2+ on the right side and 2+ on the left side.       Dorsalis pedis pulses are 1+ on the right side and 1+ on the left side.       Posterior tibial pulses are 1+ on the right side and 1+ on the left side.     Comments: Femoral and popliteal pulse difficult to feel due to patient's bodily habitus. Abdominal:     General: Abdomen is flat. Bowel sounds are normal. There is no distension.     Palpations: Abdomen is soft.     Comments: Obese  Musculoskeletal:         General: No tenderness.  Lymphadenopathy:     Cervical: No cervical adenopathy.  Skin:    General: Skin is warm and dry.  Neurological:     General: No focal deficit present.     Mental Status: He is alert and oriented to person, place, and time.  Psychiatric:        Mood and Affect: Mood normal.   Echo- 03/15/2018 1. Left ventricle cavity is normal in size. Moderate concentric hypertrophy of the left ventricle. Moderate decrease in global wall motion. Visual EF is 35-40%. Doppler evidence of grade II (pseudonormal) diastolic dysfunction, elevated LAP. 2. Trace mitral regurgitation. 3. Trace tricuspid regurgitation. 4. c.f. echo. of 10/17/2015, LV, LA sizes are normal, Grade 2 diastolic dysfunction is new, no other diagnostic Change.  Lexiscan myoview stress test 04/11/2018: 1. Lexiscan stress test was performed. Exercise capacity was not assessed. No stress symptoms reported. Blood pressure was normal. 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 Stress EKG is non diagnostic for ischemia as it is a pharmacologic stress. 2. The overall quality of the study is good. Left ventricular cavity is noted to be normal on the rest and stress studies. Gated SPECT imaging demonstrates hypokinesis of the basal inferior, basal inferolateral, mid inferior, mid inferolateral, apical inferior and apical lateral myocardial wall(s). The left ventricular ejection fraction was calculated or visually estimated to be 38%. LSPECT images reveal a large sized, medium intensity, minimally reversible perfusion defect suggestive of large infarct  with minimal peri infarct ischemia in LCx/PDA territory.  3. High risk study.  Carotid artery duplex 03/14/2018: Stenosis in the right internal carotid artery (50-69%). Stenosis in the right common carotid artery (<50%) with  homogeneous plaque (note image). Antegrade right vertebral artery flow. Antegrade left vertebral artery flow. Compared to the study done on 04/01/2017, no significant change in the right ICA stenosis. Less than 50% stenosis noted on the left ICA no longer present. Follow up in six months is appropriate if clinically indicated..    Assessment & Recommendations:   1. SOB (shortness of breath)   2.  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 SVG to RCA mid Taxus DES (65m) patent, ( h/o Inf MI/Cath/PCI in 09/2008 PCI) native RCA w/ 2.5x23 Xience DES patent, patent LIMA to LAD and SVG to OM patent,  RCA 60% distal. Small vessel disease.   EKG 07/13/2018: Normal sinus rhythm with rate of 61 bpm, borderline left atrial abnormality, right bundle branch block.  Consider true posterior infarct.  Normal QT interval.ABNORMAL  - EKG 12-Lead  3.  Bilateral asymptomatic carotid stenosis, needs continued surveillance. 4.  Chronic systolic and diastolic heart failure, EF 35 to 40%. 5.  Bradycardia and bifascicular block, asymptomatic, heart rate improved and stable, carvedilol was discontinued previously. 6.  Diabetes mellitus controlled, stage III chronic kidney disease. 03/11/2018: Cholesterol 198, triglycerides 340, HDL 39, LDL 91.  Glucose 125, creatinine 1.4, EGFR 49/56, potassium 4.9, CMP otherwise normal.  CBC normal.  TSH 3.8.  Hemoglobin A1c 6.5%.  Recommendation: Patient is not in any acute decompensated heart failure, has class III CHF.  He remains clinically stable without worsening anginal equivalent dyspnea or chest pain. No new EKG change.  I will continue present medical therapy, I like to see him back in 3 months for follow-up of dyspnea which is his anginal equivalent, I have again discussed with him regarding obesity and weight loss which would improve overall  quality of health.  If symptoms recur, I would have a low threshold to proceed with coronary angiography in view of ischemic cardiomyopathy.  I suspect his dyspnea is also probably related to grade 2 diastolic dysfunction and obesity.   JAdrian Prows MD, FWilliams Eye Institute Pc2/19/2020, 2:13 PM PLuckeyCardiovascular. PCentral CityPager: [phone] Office: [phone] no answer Cell [phone]

## 2018-08-14 ENCOUNTER — Other Ambulatory Visit: Payer: Self-pay | Admitting: Family Medicine

## 2018-08-29 ENCOUNTER — Other Ambulatory Visit: Payer: Self-pay

## 2018-08-29 MED ORDER — ROSUVASTATIN CALCIUM 40 MG PO TABS: 40.0000 mg | ORAL_TABLET | Freq: Every day | ORAL | 1 refills | Status: DC

## 2018-08-30 ENCOUNTER — Other Ambulatory Visit: Payer: Self-pay

## 2018-08-30 MED ORDER — ROSUVASTATIN CALCIUM 40 MG PO TABS: 40.0000 mg | ORAL_TABLET | Freq: Every day | ORAL | 1 refills | Status: DC

## 2018-09-06 ENCOUNTER — Other Ambulatory Visit: Payer: Self-pay

## 2018-09-06 MED ORDER — ROSUVASTATIN CALCIUM 40 MG PO TABS: 40.0000 mg | ORAL_TABLET | Freq: Every day | ORAL | 1 refills | Status: DC

## 2018-09-20 ENCOUNTER — Other Ambulatory Visit: Payer: Medicare HMO

## 2018-10-04 ENCOUNTER — Other Ambulatory Visit: Payer: Self-pay

## 2018-10-04 DIAGNOSIS — I10 Essential (primary) hypertension: Secondary | ICD-10-CM

## 2018-10-04 MED ORDER — LOSARTAN POTASSIUM 25 MG PO TABS: 25.0000 mg | ORAL_TABLET | Freq: Every day | ORAL | 3 refills | Status: DC

## 2018-10-12 ENCOUNTER — Ambulatory Visit: Payer: Medicare HMO | Admitting: Cardiology

## 2018-10-18 ENCOUNTER — Other Ambulatory Visit: Payer: Self-pay | Admitting: Family Medicine

## 2018-10-21 ENCOUNTER — Ambulatory Visit (INDEPENDENT_AMBULATORY_CARE_PROVIDER_SITE_OTHER): Payer: Medicare HMO

## 2018-10-21 ENCOUNTER — Other Ambulatory Visit: Payer: Self-pay

## 2018-10-21 DIAGNOSIS — I6521 Occlusion and stenosis of right carotid artery: Secondary | ICD-10-CM

## 2018-10-24 ENCOUNTER — Other Ambulatory Visit: Payer: Self-pay | Admitting: Cardiology

## 2018-10-24 DIAGNOSIS — I6521 Occlusion and stenosis of right carotid artery: Secondary | ICD-10-CM

## 2018-10-31 ENCOUNTER — Other Ambulatory Visit: Payer: Self-pay

## 2018-10-31 ENCOUNTER — Encounter: Payer: Self-pay | Admitting: Cardiology

## 2018-10-31 ENCOUNTER — Ambulatory Visit (INDEPENDENT_AMBULATORY_CARE_PROVIDER_SITE_OTHER): Payer: Medicare HMO | Admitting: Cardiology

## 2018-10-31 VITALS — BP 147/87 | HR 52 | Ht 70.0 in | Wt 238.0 lb

## 2018-10-31 DIAGNOSIS — E782 Mixed hyperlipidemia: Secondary | ICD-10-CM

## 2018-10-31 DIAGNOSIS — Z951 Presence of aortocoronary bypass graft: Secondary | ICD-10-CM

## 2018-10-31 DIAGNOSIS — I6521 Occlusion and stenosis of right carotid artery: Secondary | ICD-10-CM

## 2018-10-31 DIAGNOSIS — R0602 Shortness of breath: Secondary | ICD-10-CM | POA: Diagnosis not present

## 2018-10-31 DIAGNOSIS — I25118 Atherosclerotic heart disease of native coronary artery with other forms of angina pectoris: Secondary | ICD-10-CM

## 2018-10-31 NOTE — Progress Notes (Signed)
Primary Physician/Referring:  Susy Frizzle, MD  Patient ID: Chris [name], male    DOB: [birth year], 78 y.o.   MRN: [id number]  Chief Complaint  Patient presents with  . Congestive Heart Failure    HPI: Chris [name]  is a 78 y.o. male  with hypertension, hyperlipidemia, diabetes mellitus diagnosed in 2018, CAD,hx of MI with CABG S/P multiple coronary interventions,  chronic systolic and diastolic CHF with EF 99-77% and asymptomatic carotid artery stenosis who presents to the clinic for a 3 month follow-up.   Patient had a coronary angiogram on 01/2014 which demonstrated native LAD 100, LCX 100, patent grafts and also patent stents. He underwent nuclear stress test on 04/11/2018 which had revealed large inferolateral scar with very minimal peri-infarct ischemia with EF 38% considered to be high risk, but clinically I felt it was low risk and recommended aggressive medical therapy.   I'd seen him 3 months ago, he had lost 10 pounds overall in the past 5 months and was feeling well.  States that his dyspnea is now stabilized.  No PND or orthopnea, no leg edema.  He still continues to have occasional cramping in his legs with activity.  He has tingling and numbness in his feet due to diabetes.  Past Medical History:  Diagnosis Date  . Abnormal exercise myocardial perfusion study    03/2018- 38% ef, see report  . Arthritis   . Chronic kidney disease   . Congestive heart failure (CHF) ([place]) 5/17   EF 35-40% [name]  . Coronary artery disease    a. 1995 s/p CABG x 3 (VG->RCA, LIMA->LAD, VG->OM);  b. 07/2008 Inf MI/Cath/PCI: VG->RCA 99 - treated with Taxus DES (73m), LIMA and VG->OM patent, LAD 100, LCX 100, RCA 60d, EF 50%;  c. 09/2008 PCI native RCA  w/ 2.5x23 Xience DES, VG->RCA stent patent;  c. 05/2010 Cath: Native 3VD with 3/3 patent grafts, native RCA w 80% ISR prox to graft insertion-->Med Rx.  . DOE (dyspnea on exertion)   . ED (erectile dysfunction)   . GERD  (gastroesophageal reflux disease)    occ  . Gout   . H/O hiatal hernia   . Hyperlipidemia   . Hypertension   . Myocardial infarction ([place]   . Neuropathy    toes  . Obesity   . RBBB (right bundle branch block)   . Stenosis of right internal carotid artery    50-69% (2014)  . Type II diabetes mellitus ([place]   . Umbilical hernia    a. s/p repair.  . Vertigo     Past Surgical History:  Procedure Laterality Date  . ANKLE ARTHROSCOPY WITH DRILLING/MICROFRACTURE Left 04/13/2013   Procedure: LEFT ANKLE ARTHROSCOPY WITH EXTENSIVE DEBRIEDMENT;  Surgeon: JWylene Simmer MD;  [facility] [facility]  Service: Orthopedics;  Laterality: Left;  . ANKLE SURGERY Left 08/18/2013   DR HEWITT  . CARDIAC CATHETERIZATION  2000   stents x3  . CARDIAC CATHETERIZATION  02/15/2014   Procedure: LEFT HEART CATH AND CORS/GRAFTS ANGIOGRAPHY;  Surgeon: JLaverda Page MD;  [facility];  Service: Cardiovascular;;  . COLONOSCOPY    . CORONARY ARTERY BYPASS GRAFT  1995   LIMA to LAD, SVG to RCA, SVG to OM.   .Marland KitchenTOTAL ANKLE ARTHROPLASTY Left 08/17/2013   Procedure: LEFT TOTAL ANKLE REPLACEMENT WITH POSSIBLE GASTROC RECESSION ;  Surgeon: JWylene Simmer MD;  [place] [facility]  Service: Orthopedics;  Laterality: Left;  . UMBILICAL HERNIA REPAIR  12/2007    Social History   Socioeconomic History  . Marital status: Married    Spouse name: Not on file  . Number of children: Not on file  . Years of education: Not on file  . Highest education level: Not on file  Occupational History  . Not on file  Social Needs  . Financial resource strain: Not on file  . Food insecurity:    Worry: Not on file    Inability: Not on file  . Transportation needs:    Medical: Not on file    Non-medical: Not on file  Tobacco Use  . Smoking status: Former Smoker    Packs/day: 1.00    Years: 9.00    Pack years: 9.00    Types: Cigarettes    Last attempt to quit: 10/31/1970    Years since quitting: 48.0  .  Smokeless tobacco: Former Systems developer    Types: Chew  . Tobacco comment: chewed for 2 years  Substance and Sexual Activity  . Alcohol use: No    Comment: occ wine last 6 months  . Drug use: No  . Sexual activity: Yes  Lifestyle  . Physical activity:    Days per week: Not on file    Minutes per session: Not on file  . Stress: Not on file  Relationships  . Social connections:    Talks on phone: Not on file    Gets together: Not on file    Attends religious service: Not on file    Active member of club or organization: Not on file    Attends meetings of clubs or organizations: Not on file    Relationship status: Not on file  . Intimate partner violence:    Fear of current or ex partner: Not on file    Emotionally abused: Not on file    Physically abused: Not on file    Forced sexual activity: Not on file  Other Topics Concern  . Not on file  Social History Narrative   Lives in [place] with wife.  Retired.   Review of Systems  Constitution: Negative for chills, decreased appetite, malaise/fatigue and weight gain.  Cardiovascular: Positive for dyspnea on exertion. Negative for leg swelling and syncope.  Endocrine: Negative for cold intolerance.  Hematologic/Lymphatic: Does not bruise/bleed easily.  Musculoskeletal: Negative for joint swelling.  Gastrointestinal: Negative for abdominal pain, anorexia, change in bowel habit, hematochezia and melena.  Neurological: Negative for headaches and light-headedness.  Psychiatric/Behavioral: Negative for depression and substance abuse.  All other systems reviewed and are negative.     Objective  Blood pressure (!) 147/87, pulse (!) 52, height _0  (1.778 m), weight 238 lb (108 kg). Body mass index is 34.15 kg/m. Physical exam not performed or limited due to virtual visit.   Please see exam details from prior visit is as below.    Physical Exam  Constitutional: He appears well-developed and well-nourished. No distress.  HENT:   Head: Atraumatic.  Eyes: Conjunctivae are normal.  Neck: Neck supple. No JVD present. No thyromegaly present.  Cardiovascular: Normal rate, regular rhythm, normal heart sounds and intact distal pulses. Exam reveals no gallop.  No murmur heard. Pulmonary/Chest: Effort normal and breath sounds normal.  Abdominal: Soft. Bowel sounds are normal.  Musculoskeletal: Normal range of motion.  Neurological: He is alert.  Skin: Skin is warm and dry.  Psychiatric: He has a normal mood and affect.   Radiology: No results found.  Laboratory examination:   03/11/2018: Cholesterol 198,  triglycerides 340, HDL 39, LDL 91.  Glucose 125, creatinine 1.4, EGFR 49/56, potassium 4.9, CMP otherwise normal.  CBC normal.  TSH 3.8.  Hemoglobin A1c 6.5%.  CMP Latest Ref Rng & Units 09/24/2017 11/13/2016 01/08/2015  Glucose 65 - 99 mg/dL 129(H) 91 118(H)  BUN 7 - 25 mg/dL _0 Creatinine 0.70 - 1.18 mg/dL 1.22(H) 1.28(H) 1.20(H)  Sodium 135 - 146 mmol/L 139 139 141  Potassium 3.5 - 5.3 mmol/L 4.6 4.1 4.4  Chloride 98 - 110 mmol/L 102 104 106  CO2 20 - 32 mmol/L _1 Calcium 8.6 - 10.3 mg/dL 9.7 9.4 9.4  Total Protein 6.1 - 8.1 g/dL 8.0 7.7 7.5  Total Bilirubin 0.2 - 1.2 mg/dL 0.6 0.5 0.3  Alkaline Phos 40 - 115 U/L - 46 48  AST 10 - 35 U/L _2 ALT 9 - 46 U/L _3 CBC Latest Ref Rng & Units 09/24/2017 11/13/2016 05/30/2014  WBC 3.8 - 10.8 Thousand/uL 7.8 8.7 9.1  Hemoglobin 13.2 - 17.1 g/dL 15.5 15.0 15.5  Hematocrit 38.5 - 50.0 % 45.1 45.2 44.7  Platelets 140 - 400 Thousand/uL 229 227 221   Lipid Panel     Component Value Date/Time   CHOL 147 09/24/2017 1003   TRIG 252 (H) 09/24/2017 1003   HDL 36 (L) 09/24/2017 1003   CHOLHDL 4.1 09/24/2017 1003   VLDL 77 (H) 11/13/2016 1236   LDLCALC 78 09/24/2017 1003   LDLDIRECT 99.7 01/26/2012 0904   HEMOGLOBIN A1C Lab Results  Component Value Date   HGBA1C 6.6 (H) 09/24/2017   MPG 143 09/24/2017   TSH No results for input(s): TSH in  the last 8760 hours.  PRN Meds:. There are no discontinued medications. Current Meds  Medication Sig  . allopurinol (ZYLOPRIM) 100 MG tablet Take 1 tablet (100 mg total) by mouth daily.  Marland Kitchen aspirin EC 81 MG tablet Take 1 tablet (81 mg total) by mouth daily.  Marland Kitchen losartan (COZAAR) 25 MG tablet Take 1 tablet (25 mg total) by mouth daily.  . metFORMIN (GLUCOPHAGE) 500 MG tablet TAKE 1 TABLET TWICE DAILY WITH MEALS  . nitroGLYCERIN (NITROSTAT) 0.4 MG SL tablet Place 1 tablet (0.4 mg total) under the tongue every 5 (five) minutes x 3 doses as needed for chest pain.  . Omega-3 Fatty Acids (FISH OIL PEARLS PO) Take 500 mg by mouth daily.  . pantoprazole (PROTONIX) 40 MG tablet Take 1 tablet (40 mg total) by mouth daily.  . rosuvastatin (CRESTOR) 40 MG tablet Take 1 tablet (40 mg total) by mouth daily.    Cardiac Studies:   Echo- 03/15/2018 1. Left ventricle cavity is normal in size. Moderate concentric hypertrophy of the left ventricle. Moderate decrease in global wall motion. Visual EF is 35-40%. Doppler evidence of grade II (pseudonormal) diastolic dysfunction, elevated LAP. 2. Trace mitral regurgitation. 3. Trace tricuspid regurgitation. 4. c.f. echo. of 10/17/2015, LV, LA sizes are normal, Grade 2 diastolic dysfunction is new, no other diagnostic Change.  Lexiscan myoview stress test 04/11/2018: 1. Lexiscan stress test was performed. Exercise capacity was not assessed. No stress symptoms reported. Blood pressure was normal.  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  Stress EKG is non diagnostic for ischemia as it is a pharmacologic stress. 2. The overall quality of the study is good.  Left ventricular cavity is noted to be normal  on the rest and stress studies.  Gated SPECT imaging demonstrates hypokinesis of the basal inferior, basal inferolateral,  mid inferior, mid inferolateral, apical inferior and apical lateral myocardial wall(s).  The left ventricular ejection fraction was calculated or visually estimated to be 38%. LSPECT images reveal a large sized, medium intensity, minimally reversible perfusion defect suggestive of large infarct with minimal peri infarct ischemia in LCx/PDA territory.   3. High risk study.  Coronary angiogram  02/15/2014:  Native LAD 100, LCX 100, patent grafts and also patent stents. SVG to RCA mid Taxus DES (52m) patent, ( h/o Inf MI/Cath/PCI in 09/2008 PCI) native RCA w/ 2.5x23 Xience DES patent, patent LIMA to LAD and SVG to OM patent,  RCA 60% distal. Small vessel disease.     Carotid artery duplex  10/21/2018: Stenosis in the right internal carotid artery (16-49%). Stenosis in the left internal carotid artery (16-49%). Antegrade right vertebral artery flow. Antegrade left vertebral artery flow. Follow up in one year is appropriate if clinically indicated. No significant change compared to prior study. Right ICA stenosis degree is now <50%.  Assessment   Coronary artery disease of native artery of native heart with stable angina pectoris ([place] - Coronary angiogram 02/15/2014: Occluded LAD and Cx.  Patent grafts ( 1995) LIMA to LAD SVG to OM and SVG to RCA.  Hx of CABG - 1995  s/p CABG x 3 (VG->RCA, LIMA->LAD, VG->OM);   Asymptomatic carotid artery stenosis without infarction, right - Plan: PCV CAROTID DUPLEX (BILATERAL), CANCELED: PCV CAROTID DUPLEX (BILATERAL)  SOB (shortness of breath)  Mixed hyperlipidemia  EKG 07/13/2018: Normal sinus rhythm with rate of 61 bpm, borderline left atrial abnormality, right bundle branch block.  Consider true posterior infarct.  Normal QT interval.ABNORMAL   Recommendations:   Patient with coronary artery disease and abnormal nuclear stress test, probably dyspnea is multifactorial including obesity and deconditioning but also cannot exclude progression of CAD.   Overall with lifestyle modification, symptoms have improved hence we will continue medical management only for now.  Overall his lipids are improved but triglycerides still continue to be uncontrolled and clearly related to his diet.  He and his wife are gone on a keto diet and hopefully this will bring the lipids to goal.  Checking lipids in 6 months would be appropriate instead of checking them now.  Discussed the results of the carotid artery duplex, no significant change.  We will continue surveillance.  In fact there is gradual improvement in the stenosis severity.  Office visit in 6 months.  His blood pressure was elevated today but usually very well controlled in fact it is usually on the lower limit of normal.  Hence I did not make any changes to his medications.  JAdrian Prows MD, FEyn[facility] LLC6/12/2018, 5:24 PM PGaithersburgCardiovascular. PCrothersvillePager: [phone] Office: [phone] no answer Cell [phone]

## 2018-11-10 ENCOUNTER — Encounter: Payer: Self-pay | Admitting: Family Medicine

## 2018-11-10 ENCOUNTER — Other Ambulatory Visit: Payer: Self-pay

## 2018-11-10 ENCOUNTER — Ambulatory Visit (INDEPENDENT_AMBULATORY_CARE_PROVIDER_SITE_OTHER): Payer: Medicare HMO | Admitting: Family Medicine

## 2018-11-10 ENCOUNTER — Ambulatory Visit: Payer: Medicare HMO | Admitting: Family Medicine

## 2018-11-10 VITALS — BP 140/78 | HR 54 | Temp 97.8°F | Resp 18 | Ht 71.5 in | Wt 235.0 lb

## 2018-11-10 DIAGNOSIS — I5022 Chronic systolic (congestive) heart failure: Secondary | ICD-10-CM

## 2018-11-10 DIAGNOSIS — E1140 Type 2 diabetes mellitus with diabetic neuropathy, unspecified: Secondary | ICD-10-CM

## 2018-11-10 DIAGNOSIS — Z125 Encounter for screening for malignant neoplasm of prostate: Secondary | ICD-10-CM

## 2018-11-10 DIAGNOSIS — I10 Essential (primary) hypertension: Secondary | ICD-10-CM | POA: Diagnosis not present

## 2018-11-10 DIAGNOSIS — E7800 Pure hypercholesterolemia, unspecified: Secondary | ICD-10-CM

## 2018-11-10 NOTE — Progress Notes (Signed)
Subjective:    Patient ID: Chris [name], male    DOB: February 12, 1941, 78 y.o.   MRN: [id number]  HPI Patient has not been seen since last year.  At that time his hemoglobin A1c was 6.6.  He has known history of coronary artery disease.  He also has known carotid artery stenosis less than 50% bilaterally.  Echocardiogram last year revealed an ejection fraction of 35 to 40%.  He does report some mild shortness of breath with activity but no angina or chest pain.  He has mild diabetes that has been relatively well controlled with metformin.  He is not taking the metformin regularly.  He takes it every morning but frequently forgets to take it in the evenings.  He denies any polyuria, polydipsia, or blurry vision.  He denies any myalgias or right upper quadrant pain.  His blood pressure today is well controlled.  He does have peripheral neuropathy.  He has no sensation to 10 g monofilament in either foot.  However he has normal pulses at both the dorsalis pedis and posterior tibialis although slightly diminished.  He is due for prostate cancer screening. Past Medical History:  Diagnosis Date  . Abnormal exercise myocardial perfusion study    03/2018- 38% ef, see report  . Arthritis   . Chronic kidney disease   . Congestive heart failure (CHF) ([place]) 5/17   EF 35-40% [name]  . Coronary artery disease    a. 1995 s/p CABG x 3 (VG->RCA, LIMA->LAD, VG->OM);  b. 07/2008 Inf MI/Cath/PCI: VG->RCA 99 - treated with Taxus DES (81mm), LIMA and VG->OM patent, LAD 100, LCX 100, RCA 60d, EF 50%;  c. 09/2008 PCI native RCA  w/ 2.5x23 Xience DES, VG->RCA stent patent;  c. 05/2010 Cath: Native 3VD with 3/3 patent grafts, native RCA w 80% ISR prox to graft insertion-->Med Rx.  . DOE (dyspnea on exertion)   . ED (erectile dysfunction)   . GERD (gastroesophageal reflux disease)    occ  . Gout   . H/O hiatal hernia   . Hyperlipidemia   . Hypertension   . Myocardial infarction ([place])   . Neuropathy    toes  .  Obesity   . RBBB (right bundle branch block)   . Stenosis of right internal carotid artery    50-69% (2014)  . Type II diabetes mellitus ([place])   . Umbilical hernia    a. s/p repair.  . Vertigo    Past Surgical History:  Procedure Laterality Date  . ANKLE ARTHROSCOPY WITH DRILLING/MICROFRACTURE Left 04/13/2013   Procedure: LEFT ANKLE ARTHROSCOPY WITH EXTENSIVE DEBRIEDMENT;  Surgeon: Wylene Simmer, MD;  Location: [facility];  Service: Orthopedics;  Laterality: Left;  . ANKLE SURGERY Left 08/18/2013   DR HEWITT  . CARDIAC CATHETERIZATION  2000   stents x3  . CARDIAC CATHETERIZATION  02/15/2014   Procedure: LEFT HEART CATH AND CORS/GRAFTS ANGIOGRAPHY;  Surgeon: Laverda Page, MD;  Location: [facility] LAB;  Service: Cardiovascular;;  . COLONOSCOPY    . CORONARY ARTERY BYPASS GRAFT  1995   LIMA to LAD, SVG to RCA, SVG to OM.   Marland Kitchen TOTAL ANKLE ARTHROPLASTY Left 08/17/2013   Procedure: LEFT TOTAL ANKLE REPLACEMENT WITH POSSIBLE GASTROC RECESSION ;  Surgeon: Wylene Simmer, MD;  Location: [place];  Service: Orthopedics;  Laterality: Left;  . UMBILICAL HERNIA REPAIR  12/2007   Current Outpatient Medications on File Prior to Visit  Medication Sig Dispense Refill  . allopurinol (ZYLOPRIM) 100 MG  tablet Take 1 tablet (100 mg total) by mouth daily. 90 tablet 3  . aspirin EC 81 MG tablet Take 1 tablet (81 mg total) by mouth daily.    Marland Kitchen losartan (COZAAR) 25 MG tablet Take 1 tablet (25 mg total) by mouth daily. 90 tablet 3  . metFORMIN (GLUCOPHAGE) 500 MG tablet TAKE 1 TABLET TWICE DAILY WITH MEALS 180 tablet 3  . nitroGLYCERIN (NITROSTAT) 0.4 MG SL tablet Place 1 tablet (0.4 mg total) under the tongue every 5 (five) minutes x 3 doses as needed for chest pain.  12  . Omega-3 Fatty Acids (FISH OIL PEARLS PO) Take 500 mg by mouth daily.    . pantoprazole (PROTONIX) 40 MG tablet Take 1 tablet (40 mg total) by mouth daily. 90 tablet 3  . rosuvastatin (CRESTOR) 40 MG tablet Take 1 tablet (40 mg  total) by mouth daily. 90 tablet 1   No current facility-administered medications on file prior to visit.    Allergies  Allergen Reactions  . Brilinta [Ticagrelor] Shortness Of Breath  . Crestor [Rosuvastatin Calcium] Other (See Comments)    myalgia  . Lipitor [Atorvastatin] Other (See Comments)    Intolerable muscle pain all over    Social History   Socioeconomic History  . Marital status: Married    Spouse name: Not on file  . Number of children: Not on file  . Years of education: Not on file  . Highest education level: Not on file  Occupational History  . Not on file  Social Needs  . Financial resource strain: Not on file  . Food insecurity    Worry: Not on file    Inability: Not on file  . Transportation needs    Medical: Not on file    Non-medical: Not on file  Tobacco Use  . Smoking status: Former Smoker    Packs/day: 1.00    Years: 9.00    Pack years: 9.00    Types: Cigarettes    Quit date: 10/31/1970    Years since quitting: 48.0  . Smokeless tobacco: Former Systems developer    Types: Chew  . Tobacco comment: chewed for 2 years  Substance and Sexual Activity  . Alcohol use: No    Comment: occ wine last 6 months  . Drug use: No  . Sexual activity: Yes  Lifestyle  . Physical activity    Days per week: Not on file    Minutes per session: Not on file  . Stress: Not on file  Relationships  . Social Herbalist on phone: Not on file    Gets together: Not on file    Attends religious service: Not on file    Active member of club or organization: Not on file    Attends meetings of clubs or organizations: Not on file    Relationship status: Not on file  . Intimate partner violence    Fear of current or ex partner: Not on file    Emotionally abused: Not on file    Physically abused: Not on file    Forced sexual activity: Not on file  Other Topics Concern  . Not on file  Social History Narrative   Lives in [place] with wife.  Retired.     Review of  Systems  All other systems reviewed and are negative.      Objective:   Physical Exam Vitals signs reviewed.  Constitutional:      General: He is not in acute distress.  Appearance: Normal appearance. He is not ill-appearing, toxic-appearing or diaphoretic.  Eyes:     Extraocular Movements: Extraocular movements intact.     Conjunctiva/sclera: Conjunctivae normal.     Pupils: Pupils are equal, round, and reactive to light.  Cardiovascular:     Rate and Rhythm: Normal rate and regular rhythm.     Pulses: Normal pulses.     Heart sounds: Normal heart sounds. No murmur. No gallop.   Pulmonary:     Effort: Pulmonary effort is normal. No respiratory distress.     Breath sounds: Normal breath sounds. No stridor. No wheezing, rhonchi or rales.  Chest:     Chest wall: No tenderness.  Abdominal:     General: Abdomen is flat. Bowel sounds are normal. There is no distension.     Palpations: Abdomen is soft. There is no mass.     Tenderness: There is no abdominal tenderness. There is no right CVA tenderness, left CVA tenderness, guarding or rebound.     Hernia: No hernia is present.  Musculoskeletal:     Right lower leg: Edema present.     Left lower leg: Edema present.  Neurological:     Mental Status: He is alert.           Assessment & Plan:  The primary encounter diagnosis was Controlled type 2 diabetes mellitus with diabetic neuropathy, without long-term current use of insulin ([place]). Diagnoses of Chronic systolic congestive heart failure ([place]), Benign essential HTN, Pure hypercholesterolemia, and Prostate cancer screening were also pertinent to this visit. Given his history of diabetes, his history of coronary artery disease, his history of congestive heart failure, I have strongly recommended adding Jardiance 25 mg daily to his medication regimen.  I will check his hemoglobin A1c today.  If his hemoglobin A1c is 6.5 or less, I will discontinue metformin and replaced with  Jardiance 25 mg a day.  If his hemoglobin A1c is out of control I would add Jardiance to the metformin assuming his renal function will tolerate it.  His blood pressure today is acceptable.  I will check a fasting lipid panel and ideally I like his LDL cholesterol to be below 70.  While checking fasting lab work I will check a PSA to screen for prostate cancer.  Diabetic foot exam was significant for severe neuropathy.  He has essentially no sensation in his feet to 10 g monofilament.  I recommended the patient perform daily foot evaluations for any cuts sores or bruises to help prevent skin infections.  Recommended diabetic eye exam.  Recommended colonoscopy.

## 2018-11-11 ENCOUNTER — Other Ambulatory Visit: Payer: Medicare HMO

## 2018-11-11 ENCOUNTER — Telehealth: Payer: Self-pay | Admitting: Family Medicine

## 2018-11-11 DIAGNOSIS — L219 Seborrheic dermatitis, unspecified: Secondary | ICD-10-CM

## 2018-11-11 DIAGNOSIS — E1140 Type 2 diabetes mellitus with diabetic neuropathy, unspecified: Secondary | ICD-10-CM | POA: Diagnosis not present

## 2018-11-11 LAB — COMPLETE METABOLIC PANEL WITH GFR
AG Ratio: 1.2 (calc) (ref 1.0–2.5)
ALT: 18 U/L (ref 9–46)
AST: 25 U/L (ref 10–35)
Albumin: 4.2 g/dL (ref 3.6–5.1)
Alkaline phosphatase (APISO): 41 U/L (ref 35–144)
BUN/Creatinine Ratio: 10 (calc) (ref 6–22)
BUN: 13 mg/dL (ref 7–25)
CO2: 26 mmol/L (ref 20–32)
Calcium: 9.8 mg/dL (ref 8.6–10.3)
Chloride: 102 mmol/L (ref 98–110)
Creat: 1.27 mg/dL — ABNORMAL HIGH (ref 0.70–1.18)
GFR, Est African American: 64 mL/min/{1.73_m2} (ref >=60)
GFR, Est Non African American: 55 mL/min/{1.73_m2} — ABNORMAL LOW (ref >=60)
Globulin: 3.6 g/dL (calc) (ref 1.9–3.7)
Glucose, Bld: 104 mg/dL — ABNORMAL HIGH (ref 65–99)
Potassium: 4.7 mmol/L (ref 3.5–5.3)
Sodium: 139 mmol/L (ref 135–146)
Total Bilirubin: 0.6 mg/dL (ref 0.2–1.2)
Total Protein: 7.8 g/dL (ref 6.1–8.1)

## 2018-11-11 LAB — CBC WITH DIFFERENTIAL/PLATELET
Absolute Monocytes: 583 {cells}/uL (ref 200–950)
Basophils Absolute: 58 {cells}/uL (ref 0–200)
Basophils Relative: 0.8 %
Eosinophils Absolute: 324 {cells}/uL (ref 15–500)
Eosinophils Relative: 4.5 %
HCT: 45.1 % (ref 38.5–50.0)
Hemoglobin: 15.5 g/dL (ref 13.2–17.1)
Lymphs Abs: 2894 {cells}/uL (ref 850–3900)
MCH: 31.5 pg (ref 27.0–33.0)
MCHC: 34.4 g/dL (ref 32.0–36.0)
MCV: 91.7 fL (ref 80.0–100.0)
MPV: 10.1 fL (ref 7.5–12.5)
Monocytes Relative: 8.1 %
Neutro Abs: 3341 {cells}/uL (ref 1500–7800)
Neutrophils Relative %: 46.4 %
Platelets: 215 Thousand/uL (ref 140–400)
RBC: 4.92 Million/uL (ref 4.20–5.80)
RDW: 12.4 % (ref 11.0–15.0)
Total Lymphocyte: 40.2 %
WBC: 7.2 Thousand/uL (ref 3.8–10.8)

## 2018-11-11 LAB — HEMOGLOBIN A1C
Hgb A1c MFr Bld: 6.4 %{Hb} — ABNORMAL HIGH (ref <5.7)
Mean Plasma Glucose: 137 (calc)
eAG (mmol/L): 7.6 (calc)

## 2018-11-11 LAB — LIPID PANEL
Cholesterol: 145 mg/dL (ref <200)
HDL: 39 mg/dL — ABNORMAL LOW (ref >=40)
LDL Cholesterol (Calc): 73 mg/dL
Non-HDL Cholesterol (Calc): 106 mg/dL (ref <130)
Total CHOL/HDL Ratio: 3.7 (calc) (ref <5.0)
Triglycerides: 252 mg/dL — ABNORMAL HIGH (ref <150)

## 2018-11-11 LAB — PSA: PSA: 1.3 ng/mL (ref <=4.0)

## 2018-11-11 MED ORDER — TRIAMCINOLONE ACETONIDE 0.1 % EX CREA: 1.0000 "application " | TOPICAL_CREAM | Freq: Two times a day (BID) | CUTANEOUS | 0 refills | Status: DC

## 2018-11-11 MED ORDER — EMPAGLIFLOZIN 25 MG PO TABS: 25.0000 mg | ORAL_TABLET | Freq: Every day | ORAL | 3 refills | Status: DC

## 2018-11-11 NOTE — Telephone Encounter (Signed)
Medication called/sent to requested pharmacy  

## 2018-11-11 NOTE — Telephone Encounter (Signed)
pts Jardiance 25 mg  Was not called in to pharmacy yesterday can we please call this in to [facility] [address]   Also needs refill on triamcinolone acetonide

## 2018-11-12 LAB — MICROALBUMIN, URINE: Microalb, Ur: 0.7 mg/dL

## 2018-11-28 ENCOUNTER — Other Ambulatory Visit: Payer: Self-pay | Admitting: Family Medicine

## 2018-11-28 MED ORDER — EMPAGLIFLOZIN 25 MG PO TABS: 25.0000 mg | ORAL_TABLET | Freq: Every day | ORAL | 1 refills | Status: DC

## 2019-01-06 ENCOUNTER — Telehealth: Payer: Self-pay | Admitting: Family Medicine

## 2019-01-06 NOTE — Telephone Encounter (Signed)
Patient has question regarding his medications  [phone]

## 2019-01-09 MED ORDER — METFORMIN HCL 500 MG PO TABS: 500.0000 mg | ORAL_TABLET | Freq: Two times a day (BID) | ORAL | 3 refills | Status: DC

## 2019-01-09 MED ORDER — PANTOPRAZOLE SODIUM 40 MG PO TBEC: 40.0000 mg | DELAYED_RELEASE_TABLET | Freq: Every day | ORAL | 3 refills | Status: DC

## 2019-01-09 NOTE — Telephone Encounter (Signed)
Pt needed refill on Protonix and the Jardiance is too expensive and he is going back on metformin.

## 2019-01-18 ENCOUNTER — Other Ambulatory Visit: Payer: Self-pay | Admitting: Family Medicine

## 2019-01-18 ENCOUNTER — Telehealth: Payer: Self-pay | Admitting: Family Medicine

## 2019-01-18 MED ORDER — METFORMIN HCL 500 MG PO TABS: 500.0000 mg | ORAL_TABLET | Freq: Two times a day (BID) | ORAL | 3 refills | Status: DC

## 2019-01-18 MED ORDER — NITROGLYCERIN 0.4 MG SL SUBL: 0.4000 mg | SUBLINGUAL_TABLET | SUBLINGUAL | 12 refills | Status: DC | PRN

## 2019-01-18 MED ORDER — ALLOPURINOL 100 MG PO TABS: 100.0000 mg | ORAL_TABLET | Freq: Every day | ORAL | 3 refills | Status: DC

## 2019-01-18 NOTE — Telephone Encounter (Signed)
Medication called/sent to requested pharmacy  

## 2019-01-18 NOTE — Telephone Encounter (Signed)
Patient needs refill on nitroglycerin  [facility] [address] [phone]

## 2019-02-10 DIAGNOSIS — H52203 Unspecified astigmatism, bilateral: Secondary | ICD-10-CM | POA: Diagnosis not present

## 2019-02-10 DIAGNOSIS — H524 Presbyopia: Secondary | ICD-10-CM | POA: Diagnosis not present

## 2019-02-10 DIAGNOSIS — E119 Type 2 diabetes mellitus without complications: Secondary | ICD-10-CM | POA: Diagnosis not present

## 2019-02-10 DIAGNOSIS — H25813 Combined forms of age-related cataract, bilateral: Secondary | ICD-10-CM | POA: Diagnosis not present

## 2019-02-10 LAB — HM DIABETES EYE EXAM

## 2019-03-08 DIAGNOSIS — H2511 Age-related nuclear cataract, right eye: Secondary | ICD-10-CM | POA: Diagnosis not present

## 2019-03-08 DIAGNOSIS — H25011 Cortical age-related cataract, right eye: Secondary | ICD-10-CM | POA: Diagnosis not present

## 2019-03-08 DIAGNOSIS — H2512 Age-related nuclear cataract, left eye: Secondary | ICD-10-CM | POA: Diagnosis not present

## 2019-03-08 DIAGNOSIS — H25012 Cortical age-related cataract, left eye: Secondary | ICD-10-CM | POA: Diagnosis not present

## 2019-03-15 DIAGNOSIS — H2512 Age-related nuclear cataract, left eye: Secondary | ICD-10-CM | POA: Diagnosis not present

## 2019-03-15 DIAGNOSIS — H25012 Cortical age-related cataract, left eye: Secondary | ICD-10-CM | POA: Diagnosis not present

## 2019-04-19 DIAGNOSIS — H52209 Unspecified astigmatism, unspecified eye: Secondary | ICD-10-CM | POA: Diagnosis not present

## 2019-04-19 DIAGNOSIS — H524 Presbyopia: Secondary | ICD-10-CM | POA: Diagnosis not present

## 2019-04-19 DIAGNOSIS — H5213 Myopia, bilateral: Secondary | ICD-10-CM | POA: Diagnosis not present

## 2019-05-09 ENCOUNTER — Other Ambulatory Visit: Payer: Self-pay

## 2019-05-09 ENCOUNTER — Ambulatory Visit (INDEPENDENT_AMBULATORY_CARE_PROVIDER_SITE_OTHER): Payer: Medicare HMO | Admitting: Family Medicine

## 2019-05-09 DIAGNOSIS — J019 Acute sinusitis, unspecified: Secondary | ICD-10-CM | POA: Diagnosis not present

## 2019-05-09 DIAGNOSIS — B9689 Other specified bacterial agents as the cause of diseases classified elsewhere: Secondary | ICD-10-CM

## 2019-05-09 MED ORDER — AMOXICILLIN-POT CLAVULANATE ER 1000-62.5 MG PO TB12: 2.0000 | ORAL_TABLET | Freq: Two times a day (BID) | ORAL | 0 refills | Status: DC

## 2019-05-09 NOTE — Progress Notes (Signed)
Subjective:    Patient ID: Chris [name], male    DOB: Oct 10, 1940, 78 y.o.   MRN: [id number]  HPI Patient is being seen today by telephone.  Phone call began at 830.  Phone call concluded at 847.  Patient consents to be seen via telephone.  Patient states that he has had a cold for 3 weeks.  He has tried Robitussin and NyQuil for the last 2 to 3 weeks with no relief.  Over the last 7 days it has worsened substantially.  He now has a cough productive of green sputum.  He always has shortness of breath related to his cardiovascular disease however his shortness of breath is getting slightly worse.  He also reports severe pressure-like pain behind his eyes and in his forehead that is constant.  It is worse whenever he leans forward.  He also complains of pain and pressure behind his cheeks.  He is blowing green thick purulent mucus from his nostrils.  On the telephone today, the patient sounds slightly winded as we tell although he does not appear to be in respiratory distress.  I do not hear any wheezing.  He states that he is afebrile.  He denies any chest pain. Past Medical History:  Diagnosis Date  . Abnormal exercise myocardial perfusion study    03/2018- 38% ef, see report  . Arthritis   . Chronic kidney disease   . Congestive heart failure (CHF) ([place]) 5/17   EF 35-40% [name]  . Coronary artery disease    a. 1995 s/p CABG x 3 (VG->RCA, LIMA->LAD, VG->OM);  b. 07/2008 Inf MI/Cath/PCI: VG->RCA 99 - treated with Taxus DES (63mm), LIMA and VG->OM patent, LAD 100, LCX 100, RCA 60d, EF 50%;  c. 09/2008 PCI native RCA  w/ 2.5x23 Xience DES, VG->RCA stent patent;  c. 05/2010 Cath: Native 3VD with 3/3 patent grafts, native RCA w 80% ISR prox to graft insertion-->Med Rx.  . DOE (dyspnea on exertion)   . ED (erectile dysfunction)   . GERD (gastroesophageal reflux disease)    occ  . Gout   . H/O hiatal hernia   . Hyperlipidemia   . Hypertension   . Myocardial infarction ([place])   . Neuropathy     toes  . Obesity   . RBBB (right bundle branch block)   . Stenosis of right internal carotid artery    50-69% (2014)  . Type II diabetes mellitus ([place])   . Umbilical hernia    a. s/p repair.  . Vertigo    Past Surgical History:  Procedure Laterality Date  . ANKLE ARTHROSCOPY WITH DRILLING/MICROFRACTURE Left 04/13/2013   Procedure: LEFT ANKLE ARTHROSCOPY WITH EXTENSIVE DEBRIEDMENT;  Surgeon: Wylene Simmer, MD;  Location: [facility];  Service: Orthopedics;  Laterality: Left;  . ANKLE SURGERY Left 08/18/2013   DR HEWITT  . CARDIAC CATHETERIZATION  2000   stents x3  . CARDIAC CATHETERIZATION  02/15/2014   Procedure: LEFT HEART CATH AND CORS/GRAFTS ANGIOGRAPHY;  Surgeon: Laverda Page, MD;  Location: [facility] CATH LAB;  Service: Cardiovascular;;  . COLONOSCOPY    . CORONARY ARTERY BYPASS GRAFT  1995   LIMA to LAD, SVG to RCA, SVG to OM.   Marland Kitchen TOTAL ANKLE ARTHROPLASTY Left 08/17/2013   Procedure: LEFT TOTAL ANKLE REPLACEMENT WITH POSSIBLE GASTROC RECESSION ;  Surgeon: Wylene Simmer, MD;  Location: [facility];  Service: Orthopedics;  Laterality: Left;  . UMBILICAL HERNIA REPAIR  12/2007   Current Outpatient Medications on File  Prior to Visit  Medication Sig Dispense Refill  . allopurinol (ZYLOPRIM) 100 MG tablet Take 1 tablet (100 mg total) by mouth daily. 90 tablet 3  . aspirin EC 81 MG tablet Take 1 tablet (81 mg total) by mouth daily.    Marland Kitchen losartan (COZAAR) 25 MG tablet Take 1 tablet (25 mg total) by mouth daily. 90 tablet 3  . metFORMIN (GLUCOPHAGE) 500 MG tablet Take 1 tablet (500 mg total) by mouth 2 (two) times daily with a meal. 180 tablet 3  . nitroGLYCERIN (NITROSTAT) 0.4 MG SL tablet Place 1 tablet (0.4 mg total) under the tongue every 5 (five) minutes x 3 doses as needed for chest pain. 25 tablet 12  . Omega-3 Fatty Acids (FISH OIL PEARLS PO) Take 500 mg by mouth daily.    . pantoprazole (PROTONIX) 40 MG tablet Take 1 tablet (40 mg total) by mouth daily. 90 tablet 3  .  rosuvastatin (CRESTOR) 40 MG tablet Take 1 tablet (40 mg total) by mouth daily. 90 tablet 1  . triamcinolone cream (KENALOG) 0.1 % Apply 1 application topically 2 (two) times daily. 30 g 0   No current facility-administered medications on file prior to visit.   Allergies  Allergen Reactions  . Brilinta [Ticagrelor] Shortness Of Breath  . Crestor [Rosuvastatin Calcium] Other (See Comments)    myalgia  . Lipitor [Atorvastatin] Other (See Comments)    Intolerable muscle pain all over    Social History   Socioeconomic History  . Marital status: Married    Spouse name: Not on file  . Number of children: Not on file  . Years of education: Not on file  . Highest education level: Not on file  Occupational History  . Not on file  Tobacco Use  . Smoking status: Former Smoker    Packs/day: 1.00    Years: 9.00    Pack years: 9.00    Types: Cigarettes    Quit date: 10/31/1970    Years since quitting: 48.5  . Smokeless tobacco: Former Systems developer    Types: Chew  . Tobacco comment: chewed for 2 years  Substance and Sexual Activity  . Alcohol use: No    Comment: occ wine last 6 months  . Drug use: No  . Sexual activity: Yes  Other Topics Concern  . Not on file  Social History Narrative   Lives in [place] with wife.  Retired.   Social Determinants of Health   Financial Resource Strain:   . Difficulty of Paying Living Expenses: Not on file  Food Insecurity:   . Worried About Charity fundraiser in the Last Year: Not on file  . Ran Out of Food in the Last Year: Not on file  Transportation Needs:   . Lack of Transportation (Medical): Not on file  . Lack of Transportation (Non-Medical): Not on file  Physical Activity:   . Days of Exercise per Week: Not on file  . Minutes of Exercise per Session: Not on file  Stress:   . Feeling of Stress : Not on file  Social Connections:   . Frequency of Communication with Friends and Family: Not on file  . Frequency of Social Gatherings with  Friends and Family: Not on file  . Attends Religious Services: Not on file  . Active Member of Clubs or Organizations: Not on file  . Attends Archivist Meetings: Not on file  . Marital Status: Not on file  Intimate Partner Violence:   . Fear of  Current or Ex-Partner: Not on file  . Emotionally Abused: Not on file  . Physically Abused: Not on file  . Sexually Abused: Not on file      Review of Systems  All other systems reviewed and are negative.      Objective:   Physical Exam  Physical exam cannot be performed today as the patient was seen via telephone.  However he is calm and in no apparent respiratory distress.  He states that he is afebrile.  He does seem a little more winded simply in talking than is his baseline.  He is also coughing frequently throughout our telephone encounter      Assessment & Plan:  Acute bacterial rhinosinusitis  Patient certainly has symptoms of a sinus infection.  Symptoms of been present now for 3 weeks and are worsening.  He is having pain in his sinuses.  However given his purulent sputum, constant cough, and slightly increased shortness of breath I am also concerned about possible early pneumonia.  Therefore I will treat the patient with Augmentin extended release tablets 1000 mg, he is to take 2 tablets twice a day for 10 days.  This should also cover community-acquired pneumonia in addition to sinusitis.  I have also recommended the patient be tested for Covid.  I provided the patient with the contact information and gave him directions on how to schedule an appointment.

## 2019-05-10 ENCOUNTER — Telehealth: Payer: Self-pay | Admitting: *Deleted

## 2019-05-10 MED ORDER — AMOXICILLIN-POT CLAVULANATE 875-125 MG PO TABS: 1.0000 | ORAL_TABLET | Freq: Two times a day (BID) | ORAL | 0 refills | Status: DC

## 2019-05-10 NOTE — Telephone Encounter (Signed)
Received fax requesting alternative to Amoxicillin XR 1000/62.5mg  tabs. Medication is not covered by insurance plan.   MD please advise.

## 2019-05-10 NOTE — Telephone Encounter (Signed)
Prescription sent to pharmacy.

## 2019-05-10 NOTE — Telephone Encounter (Signed)
Augmentin 875mg -125mg  BID for 10 days

## 2019-05-11 ENCOUNTER — Ambulatory Visit: Payer: Medicare Other | Attending: Internal Medicine

## 2019-05-11 DIAGNOSIS — Z20828 Contact with and (suspected) exposure to other viral communicable diseases: Secondary | ICD-10-CM | POA: Diagnosis not present

## 2019-05-11 DIAGNOSIS — Z20822 Contact with and (suspected) exposure to covid-19: Secondary | ICD-10-CM

## 2019-05-12 ENCOUNTER — Ambulatory Visit: Payer: Medicare HMO | Admitting: Cardiology

## 2019-05-13 LAB — NOVEL CORONAVIRUS, NAA: SARS-CoV-2, NAA: NOT DETECTED

## 2019-05-15 ENCOUNTER — Other Ambulatory Visit: Payer: Self-pay | Admitting: Family Medicine

## 2019-05-15 MED ORDER — AMOXICILLIN-POT CLAVULANATE 875-125 MG PO TABS: 1.0000 | ORAL_TABLET | Freq: Two times a day (BID) | ORAL | 0 refills | Status: DC

## 2019-05-15 NOTE — Telephone Encounter (Signed)
CB# [phone]  [facility] in [place] on [address] And [employer]. He needs the amoxicillin that was sent to [facility] on the 16th of December. [employer] didn't process the order. Please call patient once this has been called in. He has been waiting on this medication and he isn't feeling well at all.

## 2019-05-15 NOTE — Telephone Encounter (Signed)
Med sent to [facility]

## 2019-05-15 NOTE — Telephone Encounter (Signed)
Call placed to patient and patient made aware.  

## 2019-05-25 ENCOUNTER — Other Ambulatory Visit: Payer: Self-pay | Admitting: Family Medicine

## 2019-06-15 ENCOUNTER — Other Ambulatory Visit: Payer: Self-pay | Admitting: Cardiology

## 2019-06-20 ENCOUNTER — Other Ambulatory Visit: Payer: Self-pay

## 2019-06-20 ENCOUNTER — Ambulatory Visit (INDEPENDENT_AMBULATORY_CARE_PROVIDER_SITE_OTHER): Payer: Medicare HMO | Admitting: Family Medicine

## 2019-06-20 DIAGNOSIS — J322 Chronic ethmoidal sinusitis: Secondary | ICD-10-CM

## 2019-06-20 MED ORDER — CEFDINIR 300 MG PO CAPS: 300.0000 mg | ORAL_CAPSULE | Freq: Two times a day (BID) | ORAL | 0 refills | Status: DC

## 2019-06-20 MED ORDER — PREDNISONE 20 MG PO TABS: ORAL_TABLET | ORAL | 0 refills | Status: DC

## 2019-06-20 NOTE — Progress Notes (Signed)
Subjective:    Patient ID: Chris [name], male    DOB: 12-20-1940, 79 y.o.   MRN: [id number]  HPI 05/09/19 Patient is being seen today by telephone.  Phone call began at 830.  Phone call concluded at 847.  Patient consents to be seen via telephone.  Patient states that he has had a cold for 3 weeks.  He has tried Robitussin and NyQuil for the last 2 to 3 weeks with no relief.  Over the last 7 days it has worsened substantially.  He now has a cough productive of green sputum.  He always has shortness of breath related to his cardiovascular disease however his shortness of breath is getting slightly worse.  He also reports severe pressure-like pain behind his eyes and in his forehead that is constant.  It is worse whenever he leans forward.  He also complains of pain and pressure behind his cheeks.  He is blowing green thick purulent mucus from his nostrils.  On the telephone today, the patient sounds slightly winded as we tell although he does not appear to be in respiratory distress.  I do not hear any wheezing.  He states that he is afebrile.  He denies any chest pain.  At that time, my plan was: Patient certainly has symptoms of a sinus infection.  Symptoms of been present now for 3 weeks and are worsening.  He is having pain in his sinuses.  However given his purulent sputum, constant cough, and slightly increased shortness of breath I am also concerned about possible early pneumonia.  Therefore I will treat the patient with Augmentin extended release tablets 1000 mg, he is to take 2 tablets twice a day for 10 days.  This should also cover community-acquired pneumonia in addition to sinusitis.  I have also recommended the patient be tested for Covid.  I provided the patient with the contact information and gave him directions on how to schedule an appointment.  06/20/19 Patient is being seen today as a telephone visit.  He consents to be seen via telephone.  Phone call began at 1212.  Phone call  concluded at 1226.  Patient states that he continues to have the exact same symptoms we discussed 1 month ago.  He continues to have pain and pressure in his sinuses.  However now the infection has centered in the area of his ethmoid sinus directly behind his nasal bridge.  He states that is constant pressure in that area it feels full.  He also reports postnasal drip draining down his throat causing a sore throat in the mornings.  He also has to cough up green mucus however he can tell that this is drainage.  The cough does not feel "deep".  Instead he can feel the drainage going down his throat that he has to cough up on a daily basis.  He denies any chest pain.  He denies any shortness of breath.  He denies any fever.  He denies any pleurisy.  He does report a constant sinus headache however as well as fatigue and myalgias.  He denies any nausea or vomiting.  He has been taking over-the-counter cold medication under the direction of his pharmacist with no relief. Past Medical History:  Diagnosis Date  . Abnormal exercise myocardial perfusion study    03/2018- 38% ef, see report  . Arthritis   . Chronic kidney disease   . Congestive heart failure (CHF) ([place]) 5/17   EF 35-40% [name]  .  Coronary artery disease    a. 1995 s/p CABG x 3 (VG->RCA, LIMA->LAD, VG->OM);  b. 07/2008 Inf MI/Cath/PCI: VG->RCA 99 - treated with Taxus DES (15mm), LIMA and VG->OM patent, LAD 100, LCX 100, RCA 60d, EF 50%;  c. 09/2008 PCI native RCA  w/ 2.5x23 Xience DES, VG->RCA stent patent;  c. 05/2010 Cath: Native 3VD with 3/3 patent grafts, native RCA w 80% ISR prox to graft insertion-->Med Rx.  . DOE (dyspnea on exertion)   . ED (erectile dysfunction)   . GERD (gastroesophageal reflux disease)    occ  . Gout   . H/O hiatal hernia   . Hyperlipidemia   . Hypertension   . Myocardial infarction ([place])   . Neuropathy    toes  . Obesity   . RBBB (right bundle branch block)   . Stenosis of right internal carotid  artery    50-69% (2014)  . Type II diabetes mellitus ([place])   . Umbilical hernia    a. s/p repair.  . Vertigo    Past Surgical History:  Procedure Laterality Date  . ANKLE ARTHROSCOPY WITH DRILLING/MICROFRACTURE Left 04/13/2013   Procedure: LEFT ANKLE ARTHROSCOPY WITH EXTENSIVE DEBRIEDMENT;  Surgeon: Wylene Simmer, MD;  Location: [facility];  Service: Orthopedics;  Laterality: Left;  . ANKLE SURGERY Left 08/18/2013   DR HEWITT  . CARDIAC CATHETERIZATION  2000   stents x3  . CARDIAC CATHETERIZATION  02/15/2014   Procedure: LEFT HEART CATH AND CORS/GRAFTS ANGIOGRAPHY;  Surgeon: Laverda Page, MD;  [facility] CATH LAB;  Service: Cardiovascular;;  . COLONOSCOPY    . CORONARY ARTERY BYPASS GRAFT  1995   LIMA to LAD, SVG to RCA, SVG to OM.   Marland Kitchen TOTAL ANKLE ARTHROPLASTY Left 08/17/2013   Procedure: LEFT TOTAL ANKLE REPLACEMENT WITH POSSIBLE GASTROC RECESSION ;  Surgeon: Wylene Simmer, MD;  Location: [facility];  Service: Orthopedics;  Laterality: Left;  . UMBILICAL HERNIA REPAIR  12/2007   Current Outpatient Medications on File Prior to Visit  Medication Sig Dispense Refill  . allopurinol (ZYLOPRIM) 100 MG tablet Take 1 tablet (100 mg total) by mouth daily. 90 tablet 3  . amoxicillin-clavulanate (AUGMENTIN) 875-125 MG tablet Take 1 tablet by mouth 2 (two) times daily. 20 tablet 0  . aspirin EC 81 MG tablet Take 1 tablet (81 mg total) by mouth daily.    Marland Kitchen losartan (COZAAR) 25 MG tablet Take 1 tablet (25 mg total) by mouth daily. 90 tablet 3  . metFORMIN (GLUCOPHAGE) 500 MG tablet Take 1 tablet (500 mg total) by mouth 2 (two) times daily with a meal. 180 tablet 3  . nitroGLYCERIN (NITROSTAT) 0.4 MG SL tablet Place 1 tablet (0.4 mg total) under the tongue every 5 (five) minutes x 3 doses as needed for chest pain. 25 tablet 12  . Omega-3 Fatty Acids (FISH OIL PEARLS PO) Take 500 mg by mouth daily.    . pantoprazole (PROTONIX) 40 MG tablet Take 1 tablet (40 mg total) by mouth daily. 90  tablet 3  . rosuvastatin (CRESTOR) 40 MG tablet TAKE 1 TABLET EVERY DAY 90 tablet 1  . triamcinolone cream (KENALOG) 0.1 % Apply 1 application topically 2 (two) times daily. 30 g 0   No current facility-administered medications on file prior to visit.   Allergies  Allergen Reactions  . Brilinta [Ticagrelor] Shortness Of Breath  . Crestor [Rosuvastatin Calcium] Other (See Comments)    myalgia  . Lipitor [Atorvastatin] Other (See Comments)    Intolerable muscle pain  all over    Social History   Socioeconomic History  . Marital status: Married    Spouse name: Not on file  . Number of children: Not on file  . Years of education: Not on file  . Highest education level: Not on file  Occupational History  . Not on file  Tobacco Use  . Smoking status: Former Smoker    Packs/day: 1.00    Years: 9.00    Pack years: 9.00    Types: Cigarettes    Quit date: 10/31/1970    Years since quitting: 48.6  . Smokeless tobacco: Former Systems developer    Types: Chew  . Tobacco comment: chewed for 2 years  Substance and Sexual Activity  . Alcohol use: No    Comment: occ wine last 6 months  . Drug use: No  . Sexual activity: Yes  Other Topics Concern  . Not on file  Social History Narrative   Lives in [place] with wife.  Retired.   Social Determinants of Health   Financial Resource Strain:   . Difficulty of Paying Living Expenses: Not on file  Food Insecurity:   . Worried About Charity fundraiser in the Last Year: Not on file  . Ran Out of Food in the Last Year: Not on file  Transportation Needs:   . Lack of Transportation (Medical): Not on file  . Lack of Transportation (Non-Medical): Not on file  Physical Activity:   . Days of Exercise per Week: Not on file  . Minutes of Exercise per Session: Not on file  Stress:   . Feeling of Stress : Not on file  Social Connections:   . Frequency of Communication with Friends and Family: Not on file  . Frequency of Social Gatherings with Friends  and Family: Not on file  . Attends Religious Services: Not on file  . Active Member of Clubs or Organizations: Not on file  . Attends Archivist Meetings: Not on file  . Marital Status: Not on file  Intimate Partner Violence:   . Fear of Current or Ex-Partner: Not on file  . Emotionally Abused: Not on file  . Physically Abused: Not on file  . Sexually Abused: Not on file      Review of Systems  All other systems reviewed and are negative.      Objective:   Physical Exam         Assessment & Plan:  Chronic ethmoidal sinusitis  Patient appears to have chronic sinusitis most likely in the ethmoid sinus.  I have recommended using Omnicef 300 mg p.o. twice daily for 10 days coupled with a prednisone taper pack as well as a Nettie pot.  If symptoms or not improving at that point I would recommend a CT scan to confirm chronic sinusitis and likely recommend an ENT consultation.

## 2019-07-03 ENCOUNTER — Other Ambulatory Visit: Payer: Self-pay | Admitting: Cardiology

## 2019-07-03 DIAGNOSIS — I10 Essential (primary) hypertension: Secondary | ICD-10-CM

## 2019-07-09 ENCOUNTER — Ambulatory Visit: Payer: Medicare Other | Attending: Internal Medicine

## 2019-07-09 DIAGNOSIS — Z23 Encounter for immunization: Secondary | ICD-10-CM | POA: Insufficient documentation

## 2019-07-09 NOTE — Progress Notes (Signed)
   Covid-19 Vaccination Clinic  Name:  LORAL DEMARCHI    MRN: [id number] DOB: Dec 18, 1940  07/09/2019  Mr. Desarro was observed post Covid-19 immunization for 15 minutes without incidence. He was provided with Vaccine Information Sheet and instruction to access the V-Safe system.   Mr. Rinehimer was instructed to call 911 with any severe reactions post vaccine: Marland Kitchen Difficulty breathing  . Swelling of your face and throat  . A fast heartbeat  . A bad rash all over your body  . Dizziness and weakness    Immunizations Administered    Name Date Dose VIS Date Route   Pfizer COVID-19 Vaccine 07/09/2019 10:42 AM 0.3 mL 05/05/2019 Intramuscular   Manufacturer: [place]   Lot: [id number]   [place]: [id number]

## 2019-07-17 DIAGNOSIS — Z961 Presence of intraocular lens: Secondary | ICD-10-CM | POA: Diagnosis not present

## 2019-07-24 ENCOUNTER — Encounter: Payer: Self-pay | Admitting: Family Medicine

## 2019-07-24 ENCOUNTER — Ambulatory Visit
Admission: RE | Admit: 2019-07-24 | Discharge: 2019-07-24 | Disposition: A | Discharge status: Home / Self Care | Payer: Medicare HMO | Source: Ambulatory Visit | Attending: Family Medicine | Admitting: Family Medicine

## 2019-07-24 ENCOUNTER — Ambulatory Visit (INDEPENDENT_AMBULATORY_CARE_PROVIDER_SITE_OTHER): Payer: Medicare HMO | Admitting: Family Medicine

## 2019-07-24 ENCOUNTER — Other Ambulatory Visit: Payer: Self-pay

## 2019-07-24 VITALS — BP 110/58 | HR 75 | Temp 97.6°F | Resp 19 | Ht 71.5 in

## 2019-07-24 DIAGNOSIS — R0602 Shortness of breath: Secondary | ICD-10-CM

## 2019-07-24 DIAGNOSIS — I5022 Chronic systolic (congestive) heart failure: Secondary | ICD-10-CM

## 2019-07-24 DIAGNOSIS — I2510 Atherosclerotic heart disease of native coronary artery without angina pectoris: Secondary | ICD-10-CM | POA: Diagnosis not present

## 2019-07-24 DIAGNOSIS — E7800 Pure hypercholesterolemia, unspecified: Secondary | ICD-10-CM | POA: Diagnosis not present

## 2019-07-24 DIAGNOSIS — R05 Cough: Secondary | ICD-10-CM | POA: Diagnosis not present

## 2019-07-24 DIAGNOSIS — E1122 Type 2 diabetes mellitus with diabetic chronic kidney disease: Secondary | ICD-10-CM | POA: Diagnosis not present

## 2019-07-24 DIAGNOSIS — I110 Hypertensive heart disease with heart failure: Secondary | ICD-10-CM | POA: Diagnosis not present

## 2019-07-24 DIAGNOSIS — I10 Essential (primary) hypertension: Secondary | ICD-10-CM

## 2019-07-24 LAB — COMPLETE METABOLIC PANEL WITH GFR
AG Ratio: 1.1 (calc) (ref 1.0–2.5)
ALT: 15 U/L (ref 9–46)
AST: 21 U/L (ref 10–35)
Albumin: 4.2 g/dL (ref 3.6–5.1)
Alkaline phosphatase (APISO): 54 U/L (ref 35–144)
BUN/Creatinine Ratio: 12 (calc) (ref 6–22)
BUN: 15 mg/dL (ref 7–25)
CO2: 27 mmol/L (ref 20–32)
Calcium: 10.1 mg/dL (ref 8.6–10.3)
Chloride: 103 mmol/L (ref 98–110)
Creat: 1.22 mg/dL — ABNORMAL HIGH (ref 0.70–1.18)
GFR, Est African American: 66 mL/min/{1.73_m2} (ref >=60)
GFR, Est Non African American: 57 mL/min/{1.73_m2} — ABNORMAL LOW (ref >=60)
Globulin: 3.7 g/dL (calc) (ref 1.9–3.7)
Glucose, Bld: 83 mg/dL (ref 65–99)
Potassium: 4.8 mmol/L (ref 3.5–5.3)
Sodium: 141 mmol/L (ref 135–146)
Total Bilirubin: 0.5 mg/dL (ref 0.2–1.2)
Total Protein: 7.9 g/dL (ref 6.1–8.1)

## 2019-07-24 NOTE — Progress Notes (Signed)
Subjective:    Patient ID: Chris [name] Reasons., male    DOB: [birth year], 79 y.o.   MRN: [id number]  HPI Patient presents today complaining of worsening dyspnea on exertion.  He states that over the last few weeks his breathing has dramatically worsened.  He states that he can barely walk from his car into my office (less than 100 feet) without becoming short of breath.  He has to stop and take a break.  Last week he was using a chainsaw to clean up some limbs that have fallen off of his trees during an ice storm and he literally had to lay on the ground due to trouble breathing to catch his breath.  He denies any orthopnea.  He denies any paroxysmal nocturnal dyspnea.  He denies any angina.  Patient had a CABG in 1995.  In 2010 he suffered a myocardial infarction.  He required a stent in the bypass venous graft of the right coronary artery.  Catheterization in 2012 showed native three-vessel disease with 3 out of 3 patent grafts but he did have an 80% stenosis of the right coronary artery proximal to the stent that was deemed to be treated medically.  Patient had a coronary angiogram on 01/2014 which demonstrated native LAD 100, LCX 100, patent grafts and also patent stents. He underwent nuclear stress test on 04/11/2018 which had revealed large inferolateral scar with very minimal peri-infarct ischemia with EF 38% considered to be high risk, but clinically I felt it was low risk and recommended aggressive medical therapy.     Most recent echocardiogram showed an ejection fraction of 35%.  He is currently taking losartan.  He is not on a beta-blocker.  He is not on spironolactone.  Last saw his cardiologist in the summer 2020.  EKG today shows normal sinus rhythm with a right bundle branch block.  There is no evidence of ischemia.  There is no significant change compared to his EKG from February 2020. Past Medical History:  Diagnosis Date  . Abnormal exercise myocardial perfusion study    03/2018- 38%  ef, see report  . Arthritis   . Chronic kidney disease   . Congestive heart failure (CHF) ([place]) 5/17   EF 35-40% [name]  . Coronary artery disease    a. 1995 s/p CABG x 3 (VG->RCA, LIMA->LAD, VG->OM);  b. 07/2008 Inf MI/Cath/PCI: VG->RCA 99 - treated with Taxus DES (90mm), LIMA and VG->OM patent, LAD 100, LCX 100, RCA 60d, EF 50%;  c. 09/2008 PCI native RCA  w/ 2.5x23 Xience DES, VG->RCA stent patent;  c. 05/2010 Cath: Native 3VD with 3/3 patent grafts, native RCA w 80% ISR prox to graft insertion-->Med Rx.  . DOE (dyspnea on exertion)   . ED (erectile dysfunction)   . GERD (gastroesophageal reflux disease)    occ  . Gout   . H/O hiatal hernia   . Hyperlipidemia   . Hypertension   . Myocardial infarction ([place])   . Neuropathy    toes  . Obesity   . RBBB (right bundle branch block)   . Stenosis of right internal carotid artery    50-69% (2014)  . Type II diabetes mellitus ([place])   . Umbilical hernia    a. s/p repair.  . Vertigo     Past Surgical History:  Procedure Laterality Date  . ANKLE ARTHROSCOPY WITH DRILLING/MICROFRACTURE Left 04/13/2013   Procedure: LEFT ANKLE ARTHROSCOPY WITH EXTENSIVE DEBRIEDMENT;  Surgeon: Wylene Simmer, MD;  Location: [facility];  Service: Orthopedics;  Laterality: Left;  . ANKLE SURGERY Left 08/18/2013   DR HEWITT  . CARDIAC CATHETERIZATION  2000   stents x3  . CARDIAC CATHETERIZATION  02/15/2014   Procedure: LEFT HEART CATH AND CORS/GRAFTS ANGIOGRAPHY;  Surgeon: Laverda Page, MD;  Location: [facility] CATH LAB;  Service: Cardiovascular;;  . COLONOSCOPY    . CORONARY ARTERY BYPASS GRAFT  1995   LIMA to LAD, SVG to RCA, SVG to OM.   Marland Kitchen TOTAL ANKLE ARTHROPLASTY Left 08/17/2013   Procedure: LEFT TOTAL ANKLE REPLACEMENT WITH POSSIBLE GASTROC RECESSION ;  Surgeon: Wylene Simmer, MD;  Location: [place];  Service: Orthopedics;  Laterality: Left;  . UMBILICAL HERNIA REPAIR  12/2007   Current Outpatient Medications on File Prior to Visit   Medication Sig Dispense Refill  . allopurinol (ZYLOPRIM) 100 MG tablet Take 1 tablet (100 mg total) by mouth daily. 90 tablet 3  . aspirin EC 81 MG tablet Take 1 tablet (81 mg total) by mouth daily. (Patient taking differently: Take 81 mg by mouth every other day. )    . losartan (COZAAR) 25 MG tablet TAKE 1 TABLET (25 MG TOTAL) BY MOUTH DAILY. 90 tablet 3  . metFORMIN (GLUCOPHAGE) 500 MG tablet Take 1 tablet (500 mg total) by mouth 2 (two) times daily with a meal. 180 tablet 3  . nitroGLYCERIN (NITROSTAT) 0.4 MG SL tablet Place 1 tablet (0.4 mg total) under the tongue every 5 (five) minutes x 3 doses as needed for chest pain. 25 tablet 12  . Omega-3 Fatty Acids (FISH OIL PEARLS PO) Take 500 mg by mouth daily.    . pantoprazole (PROTONIX) 40 MG tablet Take 1 tablet (40 mg total) by mouth daily. 90 tablet 3  . rosuvastatin (CRESTOR) 40 MG tablet TAKE 1 TABLET EVERY DAY 90 tablet 1  . triamcinolone cream (KENALOG) 0.1 % Apply 1 application topically 2 (two) times daily. 30 g 0   No current facility-administered medications on file prior to visit.   Allergies  Allergen Reactions  . Brilinta [Ticagrelor] Shortness Of Breath  . Crestor [Rosuvastatin Calcium] Other (See Comments)    myalgia  . Lipitor [Atorvastatin] Other (See Comments)    Intolerable muscle pain all over    Social History   Socioeconomic History  . Marital status: Married    Spouse name: Not on file  . Number of children: Not on file  . Years of education: Not on file  . Highest education level: Not on file  Occupational History  . Not on file  Tobacco Use  . Smoking status: Former Smoker    Packs/day: 1.00    Years: 9.00    Pack years: 9.00    Types: Cigarettes    Quit date: 10/31/1970    Years since quitting: 48.7  . Smokeless tobacco: Former Systems developer    Types: Chew  . Tobacco comment: chewed for 2 years  Substance and Sexual Activity  . Alcohol use: No    Comment: occ wine last 6 months  . Drug use: No  .  Sexual activity: Yes  Other Topics Concern  . Not on file  Social History Narrative   Lives in [place] with wife.  Retired.   Social Determinants of Health   Financial Resource Strain:   . Difficulty of Paying Living Expenses: Not on file  Food Insecurity:   . Worried About Charity fundraiser in the Last Year: Not on file  . Ran Out of Food in  the Last Year: Not on file  Transportation Needs:   . Lack of Transportation (Medical): Not on file  . Lack of Transportation (Non-Medical): Not on file  Physical Activity:   . Days of Exercise per Week: Not on file  . Minutes of Exercise per Session: Not on file  Stress:   . Feeling of Stress : Not on file  Social Connections:   . Frequency of Communication with Friends and Family: Not on file  . Frequency of Social Gatherings with Friends and Family: Not on file  . Attends Religious Services: Not on file  . Active Member of Clubs or Organizations: Not on file  . Attends Archivist Meetings: Not on file  . Marital Status: Not on file  Intimate Partner Violence:   . Fear of Current or Ex-Partner: Not on file  . Emotionally Abused: Not on file  . Physically Abused: Not on file  . Sexually Abused: Not on file     Review of Systems  All other systems reviewed and are negative.      Objective:   Physical Exam Vitals reviewed.  Constitutional:      General: He is not in acute distress.    Appearance: He is obese. He is not ill-appearing or toxic-appearing.  Cardiovascular:     Rate and Rhythm: Normal rate and regular rhythm.     Heart sounds: No murmur. No friction rub. No gallop.   Pulmonary:     Effort: Pulmonary effort is normal. No respiratory distress.     Breath sounds: Normal breath sounds. No stridor. No wheezing, rhonchi or rales.  Abdominal:     General: Abdomen is flat. Bowel sounds are normal. There is no distension.     Palpations: Abdomen is soft.     Tenderness: There is no abdominal tenderness.  There is no guarding.  Musculoskeletal:     Right lower leg: Edema present.     Left lower leg: Edema present.  Neurological:     Mental Status: He is alert.           Assessment & Plan:  SOB (shortness of breath) - Plan: EKG 12-Lead, CBC with Differential/Platelet, COMPLETE METABOLIC PANEL WITH GFR, Brain natriuretic peptide, DG Chest 2 View, D-dimer, quantitative (not at [facility])  Chronic systolic congestive heart failure (HCC)  Benign essential HTN  Pure hypercholesterolemia  Coronary artery disease involving native coronary artery of native heart without angina pectoris  Patient has profound shortness of breath and dyspnea on exertion that I would categorize as NYHA class III.  Discontinue losartan and start the patient on Entresto 49/51, 1/2 tablet twice daily for 1 week and then increase to 1 tablet twice daily thereafter.  Obtain a chest x-ray to rule out other potential pulmonary causes of shortness of breath such as pneumonia or malignancy however his pulmonary exam is normal today.  Check a CBC to rule out anemia.  Check a D-dimer given the profound nature of his shortness of breath just to exclude the possibility of a pulmonary embolism however I feel most likely his symptoms are due to congestive heart failure.  I will consult his cardiologist as soon as possible to determine if the patient may need repeat coronary angiogram.  If patient tolerates [name], may also benefit from a beta-blocker to maximize medical therapy for CHF.

## 2019-07-25 ENCOUNTER — Ambulatory Visit (HOSPITAL_COMMUNITY)
Admission: RE | Admit: 2019-07-25 | Discharge: 2019-07-25 | Disposition: A | Discharge status: Home / Self Care | Payer: Medicare HMO | Source: Ambulatory Visit | Attending: Family Medicine | Admitting: Family Medicine

## 2019-07-25 ENCOUNTER — Other Ambulatory Visit: Payer: Self-pay | Admitting: Family Medicine

## 2019-07-25 DIAGNOSIS — R7989 Other specified abnormal findings of blood chemistry: Secondary | ICD-10-CM

## 2019-07-25 DIAGNOSIS — R0602 Shortness of breath: Secondary | ICD-10-CM

## 2019-07-25 DIAGNOSIS — J849 Interstitial pulmonary disease, unspecified: Secondary | ICD-10-CM

## 2019-07-25 LAB — CBC WITH DIFFERENTIAL/PLATELET
Absolute Monocytes: 800 cells/uL (ref 200–950)
Basophils Absolute: 56 cells/uL (ref 0–200)
Basophils Relative: 0.6 %
Eosinophils Absolute: 400 cells/uL (ref 15–500)
Eosinophils Relative: 4.3 %
HCT: 44.9 % (ref 38.5–50.0)
Hemoglobin: 15.3 g/dL (ref 13.2–17.1)
Lymphs Abs: 2678 cells/uL (ref 850–3900)
MCH: 31.5 pg (ref 27.0–33.0)
MCHC: 34.1 g/dL (ref 32.0–36.0)
MCV: 92.4 fL (ref 80.0–100.0)
MPV: 9.7 fL (ref 7.5–12.5)
Monocytes Relative: 8.6 %
Neutro Abs: 5366 cells/uL (ref 1500–7800)
Neutrophils Relative %: 57.7 %
Platelets: 268 10*3/uL (ref 140–400)
RBC: 4.86 10*6/uL (ref 4.20–5.80)
RDW: 12.9 % (ref 11.0–15.0)
Total Lymphocyte: 28.8 %
WBC: 9.3 10*3/uL (ref 3.8–10.8)

## 2019-07-25 LAB — BRAIN NATRIURETIC PEPTIDE: Brain Natriuretic Peptide: 36 pg/mL (ref <100)

## 2019-07-25 LAB — D-DIMER, QUANTITATIVE: D-Dimer, Quant: 0.71 mcg/mL FEU — ABNORMAL HIGH (ref <0.50)

## 2019-07-25 MED ORDER — IOHEXOL 350 MG/ML SOLN
100.0000 mL | Freq: Once | INTRAVENOUS | Status: AC | PRN
  Administered 2019-07-25: 75 mL via INTRAVENOUS

## 2019-07-30 NOTE — Progress Notes (Signed)
Primary Physician/Referring:  Susy Frizzle, MD  Patient ID: Chris Reasons., male    DOB: 07/17/1940, 79 y.o.   MRN: [id number]  Chief Complaint  Patient presents with  . Shortness of Breath  . Coronary Artery Disease  . Congestive Heart Failure   HPI:    Chris Droge.  is a 79 y.o. Caucasian male patient with hypertension, hyperlipidemia, diabetes mellitus diagnosed in 2018, CAD,hx of MI with CABG S/P multiple coronary interventions,  H/O stroke in past without residual deficits, chronic systolic and diastolic CHF with EF 123456 and asymptomatic carotid artery stenosis who presents to the clinic for evaluation of CHF and new worsening dyspnea.  Patient had a coronary angiogram on 01/2014 which demonstrated native LAD 100, LCX 100, patent grafts and also patent stents. He underwent nuclear stress test on 04/11/2018 which had revealed large inferolateral scar with very minimal peri-infarct ischemia with EF 38% considered to be high risk, but clinically I felt it was low risk and recommended aggressive medical therapy. Echo confirmed low LVEF also.  Symptoms started about 2 to 3 weeks ago 1 with rapid onset of dyspnea.  Due to elevated D-dimer, CT angiogram of the chest was performed which revealed scarring in the lungs, that appeared to be chronic.  He is now referred for further evaluation of new onset dyspnea and possible consideration for cardiac catheterization.  Past Medical History:  Diagnosis Date  . Abnormal exercise myocardial perfusion study    03/2018- 38% ef, see report  . Arthritis   . Chronic kidney disease   . Congestive heart failure (CHF) ([place]) 5/17   EF 35-40% [name]  . Coronary artery disease    a. 1995 s/p CABG x 3 (VG->RCA, LIMA->LAD, VG->OM);  b. 07/2008 Inf MI/Cath/PCI: VG->RCA 99 - treated with Taxus DES (35mm), LIMA and VG->OM patent, LAD 100, LCX 100, RCA 60d, EF 50%;  c. 09/2008 PCI native RCA  w/ 2.5x23 Xience DES, VG->RCA stent patent;  c.  05/2010 Cath: Native 3VD with 3/3 patent grafts, native RCA w 80% ISR prox to graft insertion-->Med Rx.  . DOE (dyspnea on exertion)   . ED (erectile dysfunction)   . GERD (gastroesophageal reflux disease)    occ  . Gout   . H/O hiatal hernia   . Hyperlipidemia   . Hypertension   . Myocardial infarction ([place])   . Neuropathy    toes  . Obesity   . RBBB (right bundle branch block)   . Stenosis of right internal carotid artery    50-69% (2014)  . Type II diabetes mellitus ([place])   . Umbilical hernia    a. s/p repair.  . Vertigo    Past Surgical History:  Procedure Laterality Date  . ANKLE ARTHROSCOPY WITH DRILLING/MICROFRACTURE Left 04/13/2013   Procedure: LEFT ANKLE ARTHROSCOPY WITH EXTENSIVE DEBRIEDMENT;  Surgeon: Wylene Simmer, MD;  Location: [facility];  Service: Orthopedics;  Laterality: Left;  . ANKLE SURGERY Left 08/18/2013   DR HEWITT  . CARDIAC CATHETERIZATION  2000   stents x3  . CARDIAC CATHETERIZATION  02/15/2014   Procedure: LEFT HEART CATH AND CORS/GRAFTS ANGIOGRAPHY;  Surgeon: Laverda Page, MD;  Location: [facility] CATH LAB;  Service: Cardiovascular;;  . COLONOSCOPY    . CORONARY ARTERY BYPASS GRAFT  1995   LIMA to LAD, SVG to RCA, SVG to OM.   Marland Kitchen TOTAL ANKLE ARTHROPLASTY Left 08/17/2013   Procedure: LEFT TOTAL ANKLE REPLACEMENT WITH POSSIBLE GASTROC RECESSION ;  Surgeon: Wylene Simmer, MD;  Location: [facility];  Service: Orthopedics;  Laterality: Left;  . UMBILICAL HERNIA REPAIR  12/2007   Family History  Problem Relation Age of Onset  . Aneurysm Mother   . Heart disease Father   . Heart attack Father   . Stroke Brother     Social History   Tobacco Use  . Smoking status: Former Smoker    Packs/day: 1.00    Years: 9.00    Pack years: 9.00    Types: Cigarettes    Quit date: 10/31/1970    Years since quitting: 48.7  . Smokeless tobacco: Former Systems developer    Types: Chew  . Tobacco comment: chewed for 2 years  Substance Use Topics  . Alcohol use: Yes     Comment: occ wine last 6 months   ROS  Review of Systems  Cardiovascular: Positive for dyspnea on exertion. Negative for chest pain and leg swelling.  Gastrointestinal: Negative for melena.   Objective  Blood pressure 106/67, pulse 67, temperature (!) 94.8 F (34.9 C), temperature source Temporal, resp. rate 16, height 5' 11.5" (1.816 m), weight 224 lb (101.6 kg), SpO2 95 %.  Vitals with BMI 07/31/2019 07/24/2019 11/10/2018  Height 5' 11.5" 5' 11.5" 5' 11.5"  Weight 224 lbs - 235 lbs  BMI A999333 - [id number]  Systolic A999333 A999333 [id number]  Diastolic 67 58 78  Pulse 67 75 54     Physical Exam  Constitutional: He appears well-developed and well-nourished. No distress.  Neck: No JVD present.  Cardiovascular: Normal rate, regular rhythm and normal heart sounds. Exam reveals no gallop.  No murmur heard. Pulses:      Carotid pulses are 2+ on the right side and 2+ on the left side. No JVD, No leg edema.  Fem and Pop pulse difficult to feel due to body habitus.  DP  Normal and PT absent bilateral  Pulmonary/Chest: Effort normal. He has rales (left worse than right coarse crackles).  Abdominal: Soft. Bowel sounds are normal.  Musculoskeletal:        General: Normal range of motion.   Laboratory examination:   Recent Labs    11/10/18 1606 07/24/19 1035  NA 139 141  K 4.7 4.8  CL 102 103  CO2 26 27  GLUCOSE 104* 83  BUN 13 15  CREATININE 1.27* 1.22*  CALCIUM 9.8 10.1  GFRNONAA 55* 57*  GFRAA 64 66   estimated creatinine clearance is 63 mL/min (A) (by C-G formula based on SCr of 1.22 mg/dL (H)).  CMP Latest Ref Rng & Units 07/24/2019 11/10/2018 09/24/2017  Glucose 65 - 99 mg/dL 83 104(H) 129(H)  BUN 7 - 25 mg/dL 15 13 17   Creatinine 0.70 - 1.18 mg/dL 1.22(H) 1.27(H) 1.22(H)  Sodium 135 - 146 mmol/L 141 139 139  Potassium 3.5 - 5.3 mmol/L 4.8 4.7 4.6  Chloride 98 - 110 mmol/L 103 102 102  CO2 20 - 32 mmol/L 27 26 26   Calcium 8.6 - 10.3 mg/dL 10.1 9.8 9.7  Total Protein 6.1 - 8.1 g/dL 7.9 7.8 8.0   Total Bilirubin 0.2 - 1.2 mg/dL 0.5 0.6 0.6  Alkaline Phos 40 - 115 U/L - - -  AST 10 - 35 U/L 21 25 30   ALT 9 - 46 U/L 15 18 18    CBC Latest Ref Rng & Units 07/24/2019 11/10/2018 09/24/2017  WBC 3.8 - 10.8 Thousand/uL 9.3 7.2 7.8  Hemoglobin 13.2 - 17.1 g/dL 15.3 15.5 15.5  Hematocrit 38.5 - 50.0 % 44.9  45.1 45.1  Platelets 140 - 400 Thousand/uL 268 215 229   Lipid Panel     Component Value Date/Time   CHOL 145 11/10/2018 1606   TRIG 252 (H) 11/10/2018 1606   HDL 39 (L) 11/10/2018 1606   CHOLHDL 3.7 11/10/2018 1606   VLDL 77 (H) 11/13/2016 1236   LDLCALC 73 11/10/2018 1606   LDLDIRECT 99.7 01/26/2012 0904   HEMOGLOBIN A1C Lab Results  Component Value Date   HGBA1C 6.4 (H) 11/10/2018   MPG 137 11/10/2018   TSH No results for input(s): TSH in the last 8760 hours.  Medications and allergies   Allergies  Allergen Reactions  . Brilinta [Ticagrelor] Shortness Of Breath  . Crestor [Rosuvastatin Calcium] Other (See Comments)    myalgia  . Lipitor [Atorvastatin] Other (See Comments)    Intolerable muscle pain all over      Current Outpatient Medications  Medication Instructions  . allopurinol (ZYLOPRIM) 100 mg, Oral, Daily  . aspirin EC 81 mg, Oral, Daily  . metFORMIN (GLUCOPHAGE) 500 mg, Oral, 2 times daily with meals  . nitroGLYCERIN (NITROSTAT) 0.4 mg, Sublingual, Every 5 min x3 PRN  . Omega-3 Fatty Acids (FISH OIL PEARLS PO) 500 mg, Oral, Daily  . pantoprazole (PROTONIX) 40 mg, Oral, Daily  . rosuvastatin (CRESTOR) 40 MG tablet TAKE 1 TABLET EVERY DAY  . sacubitril-valsartan (ENTRESTO) 49-51 MG 1 tablet, Oral, 2 times daily  . triamcinolone cream (KENALOG) 0.1 % 1 application, Topical, 2 times daily   Radiology:   CT angio chest 07/25/2019: 1. No evidence of pulmonary embolism. 2. Scattered ground-glass density throughout both lungs with increased central and subpleural reticulation and areas of mosaic attenuation. No frank honeycombing or significant  bronchiectasis. Differential considerations include chronic hypersensitivity pneumonitis or idiopathic interstitial pneumonia such as NSIP. Pulmonary consultation is suggested, as well as high-resolution chest CT follow-up in 6 months. 3. 5 mm pulmonary nodule along the right major fissure. No follow-up needed if patient is low-risk. Non-contrast chest CT can be considered in 12 months if patient is high-risk. This recommendation follows the consensus statement: Guidelines for Management of Incidental Pulmonary Nodules Detected on CT Images: From the Fleischner Society 2017; Radiology 2017; 284:228-243. 4.  Aortic atherosclerosis (ICD10-I70.0).  Cardiac Studies:   Echo- 03/15/2018 1. Left ventricle cavity is normal in size. Moderate concentric hypertrophy of the left ventricle. Moderate decrease in global wall motion. Visual EF is 35-40%. Doppler evidence of grade II (pseudonormal) diastolic dysfunction, elevated LAP. 2. Trace mitral regurgitation. 3. Trace tricuspid regurgitation. 4. c.f. echo. of 10/17/2015, LV, LA sizes are normal, Grade 2 diastolic dysfunction is new, no other diagnostic Change.  Lexiscan myoview stress test 04/11/2018: 1. Lexiscan stress test was performed. Exercise capacity was not assessed. No stress symptoms reported. Blood pressure was normal.  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  Stress EKG is non diagnostic for ischemia as it is a pharmacologic stress. 2. The overall quality of the study is good.  Left ventricular cavity is noted to be normal on the rest and stress studies.  Gated SPECT imaging demonstrates hypokinesis of the basal inferior, basal inferolateral, mid inferior, mid inferolateral, apical inferior and apical lateral myocardial wall(s).  The left ventricular ejection fraction was calculated or visually  estimated to be 38%. LSPECT images reveal a large sized, medium intensity, minimally reversible perfusion defect suggestive of large infarct with minimal peri infarct ischemia in LCx/PDA territory.  3. High risk study.  Coronary angiogram  02/15/2014:  Native LAD 100, LCX 100, patent grafts and also patent stents. SVG to RCA mid Taxus DES (54mm) patent, ( h/o Inf MI/Cath/PCI in 09/2008 PCI) native RCA w/ 2.5x23 Xience DES patent, patent LIMA to LAD and SVG to OM patent,  RCA 60% distal. Small vessel disease.   Carotid artery duplex  10/21/2018: Stenosis in the right internal carotid artery (16-49%). Stenosis in the left internal carotid artery (16-49%). Antegrade right vertebral artery flow. Antegrade left vertebral artery flow. Follow up in one year is appropriate if clinically indicated. No significant change compared to prior study. Right ICA stenosis degree is now <50%.  EKG EKG 07/31/2019: Normal sinus rhythm at the rate of 69 bpm, left atrial enlargement, right axis deviation, right bundle branch block.  Bifascicular block.  Low-voltage complexes.  Pulmonary disease pattern. No significant change from   EKG 07/13/2018    Assessment     ICD-10-CM   1. Dyspnea on exertion  R06.00 PCV ECHOCARDIOGRAM COMPLETE    PCV MYOCARDIAL PERFUSION WITH LEXISCAN  2. Chronic combined systolic and diastolic heart failure (HCC)  123456 Basic metabolic panel  3. Coronary artery disease of native artery of native heart with stable angina pectoris (HCC)  I25.118 EKG 12-Lead    PCV ECHOCARDIOGRAM COMPLETE    PCV MYOCARDIAL PERFUSION WITH LEXISCAN    TSH  4. Asymptomatic carotid artery stenosis without infarction, right  I65.21   5. Mixed hyperlipidemia  E78.2 LDL cholesterol, direct    Lipid Panel With LDL/HDL Ratio    No orders of the defined types were placed in this encounter.   Medications Discontinued During This Encounter  Medication Reason  . losartan (COZAAR) 25 MG tablet Error     Recommendations:   Chris Sicher.  is a 79 y.o. Caucasian male patient with hypertension, hyperlipidemia, diabetes mellitus diagnosed in 2018, CAD,hx of MI with CABG S/P multiple coronary interventions,  H/O stroke in past without residual deficits, chronic systolic and diastolic CHF with EF 123456 and asymptomatic carotid artery stenosis who presents to the clinic for evaluation of CHF and new worsening dyspnea.  He is presently not on a beta-blocker, it was taken off recently due to marked bradycardia that was noted in Dr. Samella Parr office.  Patient was also started on Entresto for possible acute decompensated heart failure.  Patient has felt dyspnea has improved since then, Entresto was started a week ago.  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  Do not suspect acute decompensated heart failure.  I will repeat echocardiogram, I will also repeat a Lexiscan tetrofosmin stress test to evaluate for any progression of coronary artery disease.  I do not think he needs a cardiac catheterization directly.  If the stress test shows new abnormality in perfusion, then will need cardiac catheterization. I agree with pulmonary consultation.  Continue Entresto for now, will continue to [place] carotid artery surveillance, he will need lipid profile testing and TSH and as he has started [place], I will obtain a BMP next week.  I like to see him back in 2 to 3 weeks for follow-up.  With regard to carotid artery stenosis, we will continue annual monitoring.  I will see him back in 6 weeks, will check lipids and TSH. External records and reports evaluated.    Chris Prows, MD, [facility] 07/31/2019, 6:23 PM [facility] [place] Office: [phone]  CC: Margaretmary Eddy, MD

## 2019-07-31 ENCOUNTER — Other Ambulatory Visit: Payer: Self-pay

## 2019-07-31 ENCOUNTER — Encounter: Payer: Self-pay | Admitting: Cardiology

## 2019-07-31 ENCOUNTER — Ambulatory Visit: Payer: Medicare HMO | Admitting: Cardiology

## 2019-07-31 VITALS — BP 106/67 | HR 67 | Temp 94.8°F | Resp 16 | Ht 71.5 in | Wt 224.0 lb

## 2019-07-31 DIAGNOSIS — I25118 Atherosclerotic heart disease of native coronary artery with other forms of angina pectoris: Secondary | ICD-10-CM | POA: Diagnosis not present

## 2019-07-31 DIAGNOSIS — E119 Type 2 diabetes mellitus without complications: Secondary | ICD-10-CM | POA: Diagnosis not present

## 2019-07-31 DIAGNOSIS — I5042 Chronic combined systolic (congestive) and diastolic (congestive) heart failure: Secondary | ICD-10-CM

## 2019-07-31 DIAGNOSIS — E782 Mixed hyperlipidemia: Secondary | ICD-10-CM | POA: Diagnosis not present

## 2019-07-31 DIAGNOSIS — I6521 Occlusion and stenosis of right carotid artery: Secondary | ICD-10-CM

## 2019-07-31 DIAGNOSIS — R0600 Dyspnea, unspecified: Secondary | ICD-10-CM | POA: Diagnosis not present

## 2019-07-31 DIAGNOSIS — R0609 Other forms of dyspnea: Secondary | ICD-10-CM

## 2019-07-31 DIAGNOSIS — I110 Hypertensive heart disease with heart failure: Secondary | ICD-10-CM | POA: Diagnosis not present

## 2019-07-31 DIAGNOSIS — I700 Atherosclerosis of aorta: Secondary | ICD-10-CM | POA: Diagnosis not present

## 2019-08-01 ENCOUNTER — Other Ambulatory Visit: Payer: Self-pay

## 2019-08-01 ENCOUNTER — Other Ambulatory Visit: Payer: Medicare HMO

## 2019-08-01 DIAGNOSIS — I2510 Atherosclerotic heart disease of native coronary artery without angina pectoris: Secondary | ICD-10-CM

## 2019-08-01 DIAGNOSIS — I5022 Chronic systolic (congestive) heart failure: Secondary | ICD-10-CM | POA: Diagnosis not present

## 2019-08-02 LAB — BASIC METABOLIC PANEL
BUN/Creatinine Ratio: 10 (calc) (ref 6–22)
BUN: 14 mg/dL (ref 7–25)
CO2: 27 mmol/L (ref 20–32)
Calcium: 10 mg/dL (ref 8.6–10.3)
Chloride: 106 mmol/L (ref 98–110)
Creat: 1.44 mg/dL — ABNORMAL HIGH (ref 0.70–1.18)
Glucose, Bld: 127 mg/dL — ABNORMAL HIGH (ref 65–99)
Potassium: 4.5 mmol/L (ref 3.5–5.3)
Sodium: 142 mmol/L (ref 135–146)

## 2019-08-02 LAB — LIPID PANEL
Cholesterol: 134 mg/dL (ref <200)
HDL: 38 mg/dL — ABNORMAL LOW (ref >=40)
LDL Cholesterol (Calc): 69 mg/dL (calc)
Non-HDL Cholesterol (Calc): 96 mg/dL (calc) (ref <130)
Total CHOL/HDL Ratio: 3.5 (calc) (ref <5.0)
Triglycerides: 200 mg/dL — ABNORMAL HIGH (ref <150)

## 2019-08-02 LAB — TSH: TSH: 4.86 mIU/L — ABNORMAL HIGH (ref 0.40–4.50)

## 2019-08-02 LAB — LDL CHOLESTEROL, DIRECT: Direct LDL: 58 mg/dL (ref <100)

## 2019-08-08 ENCOUNTER — Ambulatory Visit: Payer: Medicare HMO | Attending: Internal Medicine

## 2019-08-08 DIAGNOSIS — Z23 Encounter for immunization: Secondary | ICD-10-CM

## 2019-08-08 NOTE — Progress Notes (Signed)
   [id number] [facility]  Name:  Chris [name].    MRN: [id number] DOB: 1940/11/30  08/08/2019  [name] was observed post Covid-19 immunization for 15 minutes without incident. He was provided with Vaccine Information Sheet and instruction to access the V-Safe system.   [name] was instructed to call 911 with any severe reactions post vaccine: Marland Kitchen Difficulty breathing  . Swelling of face and throat  . A fast heartbeat  . A bad rash all over body  . Dizziness and weakness   Immunizations Administered    Name Date Dose VIS Date Route   Pfizer COVID-19 Vaccine 08/08/2019 10:22 AM 0.3 mL 05/05/2019 Intramuscular   Manufacturer: [place]   Lot: [id number]   [place]: [id number]

## 2019-08-09 ENCOUNTER — Other Ambulatory Visit: Payer: Self-pay

## 2019-08-09 ENCOUNTER — Ambulatory Visit: Payer: Medicare HMO

## 2019-08-09 DIAGNOSIS — R0600 Dyspnea, unspecified: Secondary | ICD-10-CM | POA: Diagnosis not present

## 2019-08-09 DIAGNOSIS — I25118 Atherosclerotic heart disease of native coronary artery with other forms of angina pectoris: Secondary | ICD-10-CM

## 2019-08-09 DIAGNOSIS — R0609 Other forms of dyspnea: Secondary | ICD-10-CM

## 2019-08-14 ENCOUNTER — Other Ambulatory Visit: Payer: Medicare HMO

## 2019-08-16 ENCOUNTER — Other Ambulatory Visit: Payer: Self-pay | Admitting: Family Medicine

## 2019-08-16 MED ORDER — ENTRESTO 49-51 MG PO TABS: 1.0000 | ORAL_TABLET | Freq: Two times a day (BID) | ORAL | 3 refills | Status: DC

## 2019-08-30 ENCOUNTER — Ambulatory Visit: Payer: Medicare HMO | Admitting: Cardiology

## 2019-09-21 ENCOUNTER — Other Ambulatory Visit: Payer: Self-pay

## 2019-09-21 ENCOUNTER — Ambulatory Visit: Payer: Medicare HMO | Admitting: Pulmonary Disease

## 2019-09-21 ENCOUNTER — Encounter: Payer: Self-pay | Admitting: Pulmonary Disease

## 2019-09-21 ENCOUNTER — Other Ambulatory Visit
Admission: RE | Admit: 2019-09-21 | Discharge: 2019-09-21 | Disposition: A | Discharge status: Home / Self Care | Payer: Medicare HMO | Source: Ambulatory Visit | Attending: Pulmonary Disease | Admitting: Pulmonary Disease

## 2019-09-21 VITALS — BP 100/60 | HR 53 | Temp 97.1°F | Ht 68.5 in | Wt 231.2 lb

## 2019-09-21 DIAGNOSIS — J849 Interstitial pulmonary disease, unspecified: Secondary | ICD-10-CM

## 2019-09-21 DIAGNOSIS — E119 Type 2 diabetes mellitus without complications: Secondary | ICD-10-CM | POA: Diagnosis not present

## 2019-09-21 DIAGNOSIS — I5022 Chronic systolic (congestive) heart failure: Secondary | ICD-10-CM | POA: Diagnosis not present

## 2019-09-21 LAB — CBC WITH DIFFERENTIAL/PLATELET
Abs Immature Granulocytes: 0.02 10*3/uL (ref 0.00–0.07)
Basophils Absolute: 0.1 10*3/uL (ref 0.0–0.1)
Basophils Relative: 1 %
Eosinophils Absolute: 0.4 10*3/uL (ref 0.0–0.5)
Eosinophils Relative: 4 %
HCT: 44.1 % (ref 39.0–52.0)
Hemoglobin: 14.6 g/dL (ref 13.0–17.0)
Immature Granulocytes: 0 %
Lymphocytes Relative: 37 %
Lymphs Abs: 3.3 10*3/uL (ref 0.7–4.0)
MCH: 31.1 pg (ref 26.0–34.0)
MCHC: 33.1 g/dL (ref 30.0–36.0)
MCV: 94 fL (ref 80.0–100.0)
Monocytes Absolute: 0.8 10*3/uL (ref 0.1–1.0)
Monocytes Relative: 9 %
Neutro Abs: 4.3 10*3/uL (ref 1.7–7.7)
Neutrophils Relative %: 49 %
Platelets: 202 10*3/uL (ref 150–400)
RBC: 4.69 MIL/uL (ref 4.22–5.81)
RDW: 13 % (ref 11.5–15.5)
WBC: 8.8 10*3/uL (ref 4.0–10.5)
nRBC: 0 % (ref 0.0–0.2)

## 2019-09-21 MED ORDER — PREDNISONE 10 MG (21) PO TBPK
ORAL_TABLET | ORAL | 0 refills | Status: DC
Start: 2019-09-21 — End: 2019-11-16

## 2019-09-21 NOTE — Patient Instructions (Addendum)
We are going to get some blood work done to evaluate what is causing the scarring in your lungs  I am going to give you a trial of an anti-inflammatory medication  Ordered another chest CT that is more detailed and will let us know more about your lungs  We have also ordered breathing test that will tell us about your lung capacity  We will see you in follow-up in 4 to 6 weeks time with either me or the nurse practitioner

## 2019-09-21 NOTE — Progress Notes (Signed)
Subjective:    Patient ID: Chris [name]., male    DOB: 04-Aug-1940, 79 y.o.   MRN: [id number]  HPI Patient is a 79 year old former remote smoker with only a 9 pack year history who presents for evaluation of extreme dyspnea on exertion, and fatigue over the last 4 to 6 weeks prior to this evaluation.  He notes that he has had some dyspnea for "a long time" but however since December 2020 has noted progressive worsening and over the last weeks as noted this as worsened significantly.  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  He had no PE.  Noted constant throat clearing.  No nasal congestion.  Occasional dry cough.  No hemoptysis.  He has a history of cardiomyopathy and has a known low ejection fraction the most recent EF being 35 to 40% in addition he has grade 2 diastolic dysfunction.  Patient also has multiple findings of wall motion abnormality on his echocardiogram.  He is followed by [name].  He has not had any orthopnea or paroxysmal nocturnal dyspnea.  He has intermittent chest pain but has a history of chronic stable angina.  He has not had any reflux symptoms.  Voices no other complaint.  He has not been on amiodarone or Macrodantin to his recollection.   Review of Systems A 10 point review of systems was performed and it is as noted above otherwise negative.  Past Medical History:  Diagnosis Date  . Abnormal exercise myocardial perfusion study    03/2018- 38% ef, see report  . Arthritis   . Chronic kidney disease   . Congestive heart failure (CHF) ([place]) 5/17   EF 35-40% [name]  . Coronary artery disease    a. 1995 s/p CABG x 3 (VG->RCA, LIMA->LAD, VG->OM);  b. 07/2008 Inf MI/Cath/PCI: VG->RCA 99 - treated with Taxus DES (48mm), LIMA and VG->OM patent, LAD 100, LCX 100, RCA 60d, EF 50%;  c. 09/2008 PCI native RCA  w/ 2.5x23  Xience DES, VG->RCA stent patent;  c. 05/2010 Cath: Native 3VD with 3/3 patent grafts, native RCA w 80% ISR prox to graft insertion-->Med Rx.  . DOE (dyspnea on exertion)   . ED (erectile dysfunction)   . GERD (gastroesophageal reflux disease)    occ  . Gout   . H/O hiatal hernia   . Hyperlipidemia   . Hypertension   . Myocardial infarction ([place])   . Neuropathy    toes  . Obesity   . RBBB (right bundle branch block)   . Stenosis of right internal carotid artery    50-69% (2014)  . Type II diabetes mellitus ([place])   . Umbilical hernia    a. s/p repair.  . Vertigo    Past Surgical History:  Procedure Laterality Date  . ANKLE ARTHROSCOPY WITH DRILLING/MICROFRACTURE Left 04/13/2013   Procedure: LEFT ANKLE ARTHROSCOPY WITH EXTENSIVE DEBRIEDMENT;  Surgeon: Wylene Simmer, MD;  Location: [facility];  Service: Orthopedics;  Laterality: Left;  . ANKLE SURGERY Left 08/18/2013   DR HEWITT  . CARDIAC CATHETERIZATION  2000   stents x3  . CARDIAC CATHETERIZATION  02/15/2014   Procedure: LEFT HEART CATH AND CORS/GRAFTS ANGIOGRAPHY;  Surgeon: Laverda Page, MD;  Location[facility];  Service: Cardiovascular;;  . COLONOSCOPY    . CORONARY ARTERY BYPASS GRAFT  1995   LIMA to LAD,  SVG to RCA, SVG to OM.   Marland Kitchen TOTAL ANKLE ARTHROPLASTY Left 08/17/2013   Procedure: LEFT TOTAL ANKLE REPLACEMENT WITH POSSIBLE GASTROC RECESSION ;  Surgeon: Wylene Simmer, MD;  Location: [facility];  Service: Orthopedics;  Laterality: Left;  . UMBILICAL HERNIA REPAIR  12/2007   Family History  Problem Relation Age of Onset  . Aneurysm Mother   . Heart disease Father   . Heart attack Father   . Stroke Brother    Social History   Tobacco Use  . Smoking status: Former Smoker    Packs/day: 1.00    Years: 9.00    Pack years: 9.00    Types: Cigarettes    Quit date: 10/31/1970    Years since quitting: 49.1  . Smokeless tobacco: Former Systems developer    Types: Chew  . Tobacco comment: chewed for 2 years  Substance Use  Topics  . Alcohol use: Yes    Comment: occ wine last 6 months   Used to be a Advertising account planner.  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  Allergies  Allergen Reactions  . Brilinta [Ticagrelor] Shortness Of Breath  . Crestor [Rosuvastatin Calcium] Other (See Comments)    myalgia  . Lipitor [Atorvastatin] Other (See Comments)    Intolerable muscle pain all over    Current medications reviewed  Immunization History  Administered Date(s) Administered  . PFIZER SARS-COV-2 Vaccination 07/09/2019, 08/08/2019  . Pneumococcal Conjugate-13 11/13/2016  . Pneumococcal Polysaccharide-23 08/18/2013  . Tdap 11/13/2016       Objective:   Physical Exam BP 100/60 (BP Location: Right Arm, Patient Position: Sitting, Cuff Size: Large)   Pulse (!) 53   Temp (!) 97.1 F (36.2 C) (Temporal)   Ht 5' 8.5" (1.74 m)   Wt 231 lb 3.2 oz (104.9 kg)   SpO2 94%   BMI 34.64 kg/m  GENERAL: Awake alert, tachypneic, no conversational dyspnea. Fully ambulatory. HEAD: Normocephalic, atraumatic.  EYES: Pupils equal, round, reactive to light.  No scleral icterus.  MOUTH: Nose/mouth/throat not examined due to masking requirements for COVID 19. NECK: Supple. No thyromegaly. Trachea midline. No JVD.  No adenopathy. PULMONARY: Good air entry bilaterally.  Bilateral Velcro crackles at the bases.  No other adventitious sounds. CARDIOVASCULAR: S1 and S2.  Bradycardic rate and regular rhythm.  Grade 2/6 systolic ejection murmur, no rubs or gallops heard.   GASTROINTESTINAL: Benign. MUSCULOSKELETAL: No joint deformity, no clubbing, no edema.  NEUROLOGIC: No focal deficits noted.  Speech is fluent. SKIN: Intact,warm,dry. PSYCH: Mood depressed, behavior normal.     Assessment & Plan:     ICD-10-CM   1. 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 Chronic systolic congestive heart failure (HCC)  I50.22    This issue adds complexity to his management Last LVEF 35% Followed by [name]  3. Non-insulin treated type 2 diabetes mellitus ([place])  E11.9    This issue adds complexity to his management.   Orders Placed This Encounter  Procedures  . CT Chest High Resolution    Standing Status:   Future  Number of Occurrences:   1    Standing Expiration Date:   11/20/2020    Order Specific Question:   Preferred imaging location?    Answer:   [facility]    Order Specific Question:   Radiology Contrast Protocol - do NOT remove file path    Answer:   [email or web address]  . IgE    Standing Status:   Future    Number of Occurrences:   1    Standing Expiration Date:   09/20/2020  . Hypersensitivity Pneumonitis  . CBC with Differential/Platelet    Standing Status:   Future    Number of Occurrences:   1    Standing Expiration Date:   09/20/2020  . Sedimentation rate  . ANA  . Rheumatoid Factor  . Pulmonary Function Test [facility] Only    Standing Status:   Future    Number of Occurrences:   1    Standing Expiration Date:   09/20/2020    Order Specific Question:   Full PFT: includes the following: basic spirometry, spirometry pre & post bronchodilator, diffusion capacity (DLCO), lung volumes    Answer:   Full PFT    Order Specific Question:   This test can only be performed at    Answer:   [facility]   Discussion:  Has had worsening dyspnea over the last 4 to 6 weeks.  He has noted increased fatigue.  CT angio performed to rule out PE on 25 July 2019 showed scattered groundglass density throughout both lungs and mosaic attenuation which could be consistent with  hypersensitivity pneumonitis versus NSIP.  Work-up will be performed as above.  Will obtain high-resolution CT for better delineation of the findings.  We will give him a trial of a very short course of prednisone to see if this improves his symptoms.  Will try to keep the dosage low given his diabetes.  The patient in follow-up in 4 to 6 weeks time he is to contact us prior to that time should any new difficulties arise.  Renold Don, MD [facility]   *This note was dictated using voice recognition software/Dragon.  Despite best efforts to proofread, errors can occur which can change the meaning.  Any change was purely unintentional.

## 2019-09-25 ENCOUNTER — Telehealth: Payer: Self-pay

## 2019-09-25 NOTE — Telephone Encounter (Signed)
Pt called to cx is appt to see you at this time due to lung specialist finding a issue with his lungs. He will be undergoing a few test and appts with them. He did not specify a diagnosis, but he wanted to make you aware that it may be causing his shortness of breath.

## 2019-09-25 NOTE — Telephone Encounter (Signed)
Okay, but advice him to keep the echo schedule and see me in 6 months unless he needs to see me sooner

## 2019-09-26 ENCOUNTER — Other Ambulatory Visit
Admission: RE | Admit: 2019-09-26 | Discharge: 2019-09-26 | Disposition: A | Discharge status: Home / Self Care | Payer: Medicare HMO | Source: Ambulatory Visit | Attending: Pulmonary Disease | Admitting: Pulmonary Disease

## 2019-09-26 ENCOUNTER — Ambulatory Visit: Payer: Medicare HMO | Admitting: Cardiology

## 2019-09-26 ENCOUNTER — Other Ambulatory Visit: Payer: Self-pay

## 2019-09-26 DIAGNOSIS — Z01812 Encounter for preprocedural laboratory examination: Secondary | ICD-10-CM | POA: Insufficient documentation

## 2019-09-26 DIAGNOSIS — Z20822 Contact with and (suspected) exposure to covid-19: Secondary | ICD-10-CM | POA: Insufficient documentation

## 2019-09-26 LAB — SARS CORONAVIRUS 2 (TAT 6-24 HRS): SARS Coronavirus 2: NEGATIVE

## 2019-09-27 ENCOUNTER — Ambulatory Visit: Payer: Medicare HMO | Attending: Pulmonary Disease

## 2019-09-27 DIAGNOSIS — J849 Interstitial pulmonary disease, unspecified: Secondary | ICD-10-CM | POA: Diagnosis not present

## 2019-09-27 LAB — IGE: IgE (Immunoglobulin E), Serum: 31 IU/mL (ref 6–495)

## 2019-09-27 MED ORDER — ALBUTEROL SULFATE (2.5 MG/3ML) 0.083% IN NEBU
2.5000 mg | INHALATION_SOLUTION | Freq: Once | RESPIRATORY_TRACT | Status: AC
  Administered 2019-09-27: 15:00:00 2.5 mg via RESPIRATORY_TRACT
  Filled 2019-09-27: qty 3

## 2019-09-27 NOTE — Telephone Encounter (Signed)
Put was at another appt when I called him to inform him of below message. He will call me back

## 2019-09-27 NOTE — Telephone Encounter (Signed)
Pt declined to schedule f/u appt at this time. He will keep his June carotid ultrasound appt.

## 2019-10-03 ENCOUNTER — Ambulatory Visit
Admission: RE | Admit: 2019-10-03 | Discharge: 2019-10-03 | Disposition: A | Discharge status: Home / Self Care | Payer: Medicare HMO | Source: Ambulatory Visit | Attending: Pulmonary Disease | Admitting: Pulmonary Disease

## 2019-10-03 ENCOUNTER — Other Ambulatory Visit: Payer: Self-pay

## 2019-10-03 DIAGNOSIS — R0689 Other abnormalities of breathing: Secondary | ICD-10-CM | POA: Diagnosis not present

## 2019-10-03 DIAGNOSIS — J849 Interstitial pulmonary disease, unspecified: Secondary | ICD-10-CM | POA: Diagnosis not present

## 2019-10-04 ENCOUNTER — Other Ambulatory Visit: Payer: Self-pay | Admitting: Pulmonary Disease

## 2019-10-04 DIAGNOSIS — J849 Interstitial pulmonary disease, unspecified: Secondary | ICD-10-CM

## 2019-10-04 NOTE — Progress Notes (Signed)
LMTCB

## 2019-10-04 NOTE — Progress Notes (Signed)
Spoke with pt and notified of results per Dr. Patsey Berthold.Pt verbalized understanding and denied any questions.

## 2019-10-04 NOTE — Progress Notes (Signed)
Spoke with pt and notified of results per Dr. Patsey Berthold.  Pt verbalized understanding and denied any questions.

## 2019-10-11 ENCOUNTER — Encounter: Payer: Self-pay | Admitting: Pulmonary Disease

## 2019-10-11 DIAGNOSIS — J849 Interstitial pulmonary disease, unspecified: Secondary | ICD-10-CM | POA: Diagnosis not present

## 2019-10-12 ENCOUNTER — Other Ambulatory Visit: Payer: Self-pay

## 2019-10-12 DIAGNOSIS — J849 Interstitial pulmonary disease, unspecified: Secondary | ICD-10-CM

## 2019-10-23 ENCOUNTER — Other Ambulatory Visit: Payer: Self-pay | Admitting: Cardiology

## 2019-10-31 ENCOUNTER — Other Ambulatory Visit: Payer: Self-pay

## 2019-10-31 ENCOUNTER — Ambulatory Visit: Payer: Medicare HMO

## 2019-10-31 DIAGNOSIS — I6523 Occlusion and stenosis of bilateral carotid arteries: Secondary | ICD-10-CM

## 2019-10-31 DIAGNOSIS — I6521 Occlusion and stenosis of right carotid artery: Secondary | ICD-10-CM

## 2019-11-02 ENCOUNTER — Other Ambulatory Visit: Payer: Self-pay | Admitting: Cardiology

## 2019-11-02 DIAGNOSIS — I6523 Occlusion and stenosis of bilateral carotid arteries: Secondary | ICD-10-CM

## 2019-11-03 ENCOUNTER — Telehealth: Payer: Self-pay | Admitting: Family Medicine

## 2019-11-03 NOTE — Progress Notes (Signed)
°  Chronic Care Management   Outreach Note  11/03/2019 Name: Chris [name]. MRN: [id number] DOB: Dec 04, 1940  Referred by: Susy Frizzle, MD Reason for referral : Chronic Care Management (Initial CCM Outreach)   An unsuccessful telephone outreach was attempted today. The patient was referred to the pharmacist for assistance with care management and care coordination.   Follow Up Plan:   [place]

## 2019-11-16 ENCOUNTER — Other Ambulatory Visit: Payer: Self-pay

## 2019-11-16 ENCOUNTER — Ambulatory Visit (INDEPENDENT_AMBULATORY_CARE_PROVIDER_SITE_OTHER): Payer: Medicare HMO | Admitting: Pulmonary Disease

## 2019-11-16 ENCOUNTER — Encounter: Payer: Self-pay | Admitting: Pulmonary Disease

## 2019-11-16 VITALS — BP 108/68 | HR 88 | Temp 97.6°F | Ht 68.5 in | Wt 231.8 lb

## 2019-11-16 DIAGNOSIS — I5042 Chronic combined systolic (congestive) and diastolic (congestive) heart failure: Secondary | ICD-10-CM

## 2019-11-16 DIAGNOSIS — R7981 Abnormal blood-gas level: Secondary | ICD-10-CM

## 2019-11-16 DIAGNOSIS — R0600 Dyspnea, unspecified: Secondary | ICD-10-CM

## 2019-11-16 DIAGNOSIS — E119 Type 2 diabetes mellitus without complications: Secondary | ICD-10-CM

## 2019-11-16 DIAGNOSIS — R0609 Other forms of dyspnea: Secondary | ICD-10-CM

## 2019-11-16 DIAGNOSIS — J849 Interstitial pulmonary disease, unspecified: Secondary | ICD-10-CM | POA: Diagnosis not present

## 2019-11-16 NOTE — Progress Notes (Signed)
Subjective:    Patient ID: Chris [name]., male    DOB: 1941/02/11, 79 y.o.   MRN: [id number]  HPI [name] is a 79 year old remote former smoker with a total history of 9 pack years of smoking who presents for follow-up on the issue of interstitial lung disease.  He was initially evaluated here on 21 September 2019.  For the details of that evaluation please refer to that note.  He continues to complain of dyspnea which is quite severe.  His pulmonary function tests consistent with mild restrictive physiology with preserved FEV1 and relatively well-preserved diffusion capacity.  He did have overnight oximetry which was somehow not available to [place] until today.  Oxygen saturations were down to 80% at times with number of events approximately 39 events per hour the patient will need an in lab sleep study to better delineate these issues.  Does have significant diastolic and systolic heart failure and sleep disordered breathing could be adding to his issues.  The patient has not had any fevers, chills or sweats.  No chest pain over his usual stable angina.  No other complaints today.  We had ordered connective tissue disease work-up and hypersensitivity pneumonitis panel all of these things were not drawn as directed when he was sent to the lab on his first visit.  Not sure why the lab did not do these tests.   Review of Systems A 10 point review of systems was performed and it is as noted above otherwise negative.  Allergies  Allergen Reactions  . Brilinta [Ticagrelor] Shortness Of Breath  . Crestor [Rosuvastatin Calcium] Other (See Comments)    myalgia  . Lipitor [Atorvastatin] Other (See Comments)    Intolerable muscle pain all over    Current Meds  Medication Sig  . aspirin EC 81 MG tablet Take 1 tablet (81 mg total) by mouth daily. (Patient taking differently: Take 81 mg by mouth every other day. )  . metFORMIN (GLUCOPHAGE) 500 MG tablet Take 1 tablet (500 mg total) by mouth 2  (two) times daily with a meal.  . nitroGLYCERIN (NITROSTAT) 0.4 MG SL tablet Place 1 tablet (0.4 mg total) under the tongue every 5 (five) minutes x 3 doses as needed for chest pain.  . Omega-3 Fatty Acids (FISH OIL PEARLS PO) Take 500 mg by mouth daily.  . pantoprazole (PROTONIX) 40 MG tablet Take 1 tablet (40 mg total) by mouth daily.  . rosuvastatin (CRESTOR) 40 MG tablet TAKE 1 TABLET EVERY DAY  . sacubitril-valsartan (ENTRESTO) 49-51 MG Take 1 tablet by mouth 2 (two) times daily.  Marland Kitchen triamcinolone cream (KENALOG) 0.1 % Apply 1 application topically 2 (two) times daily.  . [DISCONTINUED] allopurinol (ZYLOPRIM) 100 MG tablet Take 1 tablet (100 mg total) by mouth daily.  . [DISCONTINUED] predniSONE (STERAPRED UNI-PAK 21 TAB) 10 MG (21) TBPK tablet Take as directed in the package   Immunization History  Administered Date(s) Administered  . PFIZER SARS-COV-2 Vaccination 07/09/2019, 08/08/2019  . Pneumococcal Conjugate-13 11/13/2016  . Pneumococcal Polysaccharide-23 08/18/2013  . Tdap 11/13/2016       Objective:   Physical Exam  BP 108/68 (BP Location: Left Arm, Cuff Size: Normal)   Pulse 88   Temp 97.6 F (36.4 C) (Temporal)   Ht 5' 8.5" (1.74 m)   Wt 231 lb 12.8 oz (105.1 kg)   SpO2 95%   BMI 34.73 kg/m   GENERAL: Awake alert,no conversational dyspnea. Fully ambulatory.  Not as tachypneic on first eval.  HEAD: Normocephalic, atraumatic.  EYES: Pupils equal, round, reactive to light.  No scleral icterus.  MOUTH: Nose/mouth/throat not examined due to masking requirements for COVID 19. NECK: Supple. No thyromegaly. Trachea midline. No JVD.  No adenopathy. PULMONARY: Good air entry bilaterally.  Bilateral Velcro crackles at the bases.  No other adventitious sounds. CARDIOVASCULAR: S1 and S2.  Bradycardic rate and regular rhythm.  Grade 2/6 systolic ejection murmur, no rubs or gallops heard.   GASTROINTESTINAL: Benign. MUSCULOSKELETAL: No joint deformity, no clubbing, no edema.    NEUROLOGIC: No focal deficits noted.  Speech is fluent. SKIN: Intact,warm,dry. PSYCH: Mood depressed, behavior normal.  Recent Results (from the past 2160 hour(s))  CBC with Differential/Platelet     Status: None   Collection Time: 09/21/19  5:32 PM  Result Value Ref Range   WBC 8.8 4.0 - 10.5 K/uL   RBC 4.69 4.22 - 5.81 MIL/uL   Hemoglobin 14.6 13.0 - 17.0 g/dL   HCT 44.1 39 - 52 %   MCV 94.0 80.0 - 100.0 fL   MCH 31.1 26.0 - 34.0 pg   MCHC 33.1 30.0 - 36.0 g/dL   RDW 13.0 11.5 - 15.5 %   Platelets 202 150 - 400 K/uL   nRBC 0.0 0.0 - 0.2 %   Neutrophils Relative % 49 %   Neutro Abs 4.3 1.7 - 7.7 K/uL   Lymphocytes Relative 37 %   Lymphs Abs 3.3 0.7 - 4.0 K/uL   Monocytes Relative 9 %   Monocytes Absolute 0.8 0 - 1 K/uL   Eosinophils Relative 4 %   Eosinophils Absolute 0.4 0 - 0 K/uL   Basophils Relative 1 %   Basophils Absolute 0.1 0 - 0 K/uL   Immature Granulocytes 0 %   Abs Immature Granulocytes 0.02 0.00 - 0.07 K/uL    Comment: Performed at [facility], [address] [place], [place] [address]  IgE     Status: None   Collection Time: 09/21/19  5:32 PM  Result Value Ref Range   IgE (Immunoglobulin E), Serum 31 6 - 495 IU/mL    Comment: (NOTE) Performed At: [facility] [place], [place] [id number] Rush Farmer MD EX:[id number]   SARS CORONAVIRUS 2 (TAT 6-24 HRS) Nasopharyngeal Nasopharyngeal Swab     Status: None   Collection Time: 09/26/19  9:53 AM   Specimen: Nasopharyngeal Swab  Result Value Ref Range   SARS Coronavirus 2 NEGATIVE NEGATIVE    Comment: (NOTE) SARS-CoV-2 target nucleic acids are NOT DETECTED. The SARS-CoV-2 RNA is generally detectable in upper and lower respiratory specimens during the acute phase of infection. Negative results do not preclude SARS-CoV-2 infection, do not rule out co-infections with other pathogens, and should not be used as the sole basis for treatment or other patient management  decisions. Negative results must be combined with clinical observations, patient history, and epidemiological information. The expected result is Negative. Fact Sheet for Patients: SugarRoll.be Fact Sheet for Healthcare Providers: https://www.woods-mathews.com/ This test is not yet approved or cleared by the [place] FDA and  has been authorized for detection and/or diagnosis of SARS-CoV-2 by FDA under an Emergency Use Authorization (EUA). This EUA will remain  in effect (meaning this test can be used) for the duration of the COVID-19 declaration under Section 56 4(b)(1) of the Act, 21 U.S.C. section 360bbb-3(b)(1), unless the authorization is terminated or revoked sooner. Performed at [facility], [place] [address] [place], [place] [address]    Unfortunately ANA  and rheumatoid factor were not performed, sed rate and hypersensitivity pneumonitis panel likewise not performed though order is in as "active and need collection" in the lab.     High resolution CT scan of the chest was performed 03 Oct 2019: "Spectrum of findings compatible with fibrotic interstitial lung disease with slight basilar predominance and probable early honeycombing. Findings are consistent with UIP per consensus guidelines"    Pulmonary function testing was performed 27 Sep 2019: Showed preserved FEV1 of 2.83 L or 101% predicted.  FEV1/FVC is 91%.  ERV is 17%, remainder lung volumes are mildly reduced.  Fusion capacity is 64% which corrects to alveolar volume to 83%.  Findings are consistent with mild restrictive physiology.  Overnight oximetry 11 Oct 2019 showed oxygen saturation as low as 80%.  Patient has multiple oxygen desaturations event per hour.  Study was not made available to [place] until today for some reason.  The patient will need a split-night sleep study   Ambulatory oximetry today: No significant desaturations noted patient maintained at 92% on room air.   Baseline saturation was 96% on room air.  Assessment & Plan:     ICD-10-CM   1. Interstitial pulmonary disease ([place])  J84.9    By high resolution CT it appears to be more consistent with IPF We will still need to get blood work ordered that has not been obtained  2. 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 Abnormal pulse oximetry  R79.81    Overnight pulse oximetry shows oxygen saturation as low as 80%  Multiple events per hour Query sleep apnea  4. Chronic combined systolic (congestive) and diastolic (congestive) heart failure (HCC)  I50.42    This adds complexity to his management Management per [name] LVEF is 88%, diastolic dysfunction gradeII  5. 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  Unfortunately all of the connective tissue work-up and hypersensitivity pneumonitis work-up ordered was not performed despite the order being in the lab.  The patient was instructed to go by the lab and get these drawn.  In addition an overnight oximetry had been ordered this was not made available to [place] until today.  The patient has oxygen desaturations at nighttime as low as 80% and there appears to be approximately 39 events per hour of desaturations.  Sleep disordered breathing needs to be ruled out particularly in the setting of severe cardiac disease both systolic and diastolic heart failure.  Will obtain split-night study and lab to further determine the nature of the sleep disordered breathing/nocturnal desaturations.  This may be contributing to the patient's fatigue during the day.  We will see the patient in follow-up in 2 months time.  He is to contact us prior to that time should any new difficulties  arise.  Renold Don, MD [facility]   *This note was dictated using voice recognition software/Dragon.  Despite best efforts to proofread, errors can occur which can change the meaning.  Any change was purely unintentional.

## 2019-11-16 NOTE — Patient Instructions (Addendum)
What we discussed today:  We are going to check your oxygen level at nighttime  We will see you in follow-up in 2 months time  Make sure that you have an appointment with [name] with regards to your heart issues  I think your shortness of breath is mostly related to your heart issues

## 2019-11-28 ENCOUNTER — Ambulatory Visit (INDEPENDENT_AMBULATORY_CARE_PROVIDER_SITE_OTHER): Payer: Medicare HMO | Admitting: Family Medicine

## 2019-11-28 ENCOUNTER — Other Ambulatory Visit: Payer: Self-pay

## 2019-11-28 VITALS — BP 110/66 | HR 79 | Temp 97.0°F | Ht 68.0 in | Wt 232.0 lb

## 2019-11-28 DIAGNOSIS — J8410 Pulmonary fibrosis, unspecified: Secondary | ICD-10-CM
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
## 2019-11-30 ENCOUNTER — Other Ambulatory Visit: Payer: Self-pay | Admitting: Family Medicine

## 2019-12-07 ENCOUNTER — Telehealth: Payer: Self-pay

## 2019-12-07 NOTE — Telephone Encounter (Signed)
Just needed to know where his referral was sent to. And if it had been sent.

## 2019-12-15 ENCOUNTER — Encounter: Payer: Self-pay | Admitting: Pulmonary Disease

## 2020-01-01 DIAGNOSIS — K449 Diaphragmatic hernia without obstruction or gangrene: Secondary | ICD-10-CM | POA: Insufficient documentation

## 2020-01-01 DIAGNOSIS — J8410 Pulmonary fibrosis, unspecified: Secondary | ICD-10-CM | POA: Insufficient documentation

## 2020-01-01 DIAGNOSIS — R0600 Dyspnea, unspecified: Secondary | ICD-10-CM | POA: Insufficient documentation

## 2020-01-01 DIAGNOSIS — K440 Diaphragmatic hernia with obstruction, without gangrene: Secondary | ICD-10-CM | POA: Insufficient documentation

## 2020-01-01 DIAGNOSIS — R0609 Other forms of dyspnea: Secondary | ICD-10-CM | POA: Insufficient documentation

## 2020-01-16 ENCOUNTER — Ambulatory Visit: Payer: Medicare HMO | Admitting: Pulmonary Disease

## 2020-01-17 DIAGNOSIS — G4734 Idiopathic sleep related nonobstructive alveolar hypoventilation: Secondary | ICD-10-CM | POA: Insufficient documentation

## 2020-01-25 ENCOUNTER — Telehealth: Payer: Self-pay | Admitting: Family Medicine

## 2020-01-25 NOTE — Progress Notes (Signed)
  Chronic Care Management   Outreach Note  01/25/2020 Name: Chris [name]. MRN: [id number] DOB: 10/14/1940  Referred by: Susy Frizzle, MD Reason for referral : No chief complaint on file.   An unsuccessful telephone outreach was attempted today. The patient was referred to the pharmacist for assistance with care management and care coordination.   Follow Up Plan:   Carley Perdue [employer] Scheduler

## 2020-01-30 ENCOUNTER — Other Ambulatory Visit: Payer: Self-pay | Admitting: Family Medicine

## 2020-01-30 DIAGNOSIS — K449 Diaphragmatic hernia without obstruction or gangrene: Secondary | ICD-10-CM | POA: Diagnosis not present

## 2020-01-30 DIAGNOSIS — J8410 Pulmonary fibrosis, unspecified: Secondary | ICD-10-CM | POA: Diagnosis not present

## 2020-01-30 DIAGNOSIS — G4734 Idiopathic sleep related nonobstructive alveolar hypoventilation: Secondary | ICD-10-CM | POA: Diagnosis not present

## 2020-01-31 ENCOUNTER — Telehealth: Payer: Self-pay | Admitting: Family Medicine

## 2020-01-31 NOTE — Progress Notes (Signed)
  Chronic Care Management   Note  01/31/2020 Name: Chris [name]. MRN: [id number] DOB: 1941/02/21  [name] is a 79 y.o. year old male who is a primary care patient of Susy Frizzle, MD. I reached out to [employer]. by phone today in response to a referral sent by [name]'s PCP, Susy Frizzle, MD.   [name] was given information about Chronic Care Management services today including:  1. CCM service includes personalized support from designated clinical staff supervised by his physician, including individualized plan of care and coordination with other care providers 2. 24/7 contact phone numbers for assistance for urgent and routine care needs. 3. Service will only be billed when office clinical staff spend 20 minutes or more in a month to coordinate care. 4. Only one practitioner may furnish and bill the service in a calendar month. 5. The patient may stop CCM services at any time (effective at the end of the month) by phone call to the office staff.   Patient agreed to services and verbal consent obtained.   Follow up plan:   Carley Perdue [employer] Scheduler

## 2020-03-17 DIAGNOSIS — G4733 Obstructive sleep apnea (adult) (pediatric): Secondary | ICD-10-CM | POA: Diagnosis not present

## 2020-03-18 NOTE — Chronic Care Management (AMB) (Addendum)
Chronic Care Management Pharmacy  Name: Chris [name].  MRN: [id number] DOB: 10/23/1940  Chief Complaint/ HPI  [name],  79 y.o. , male presents for their Initial CCM visit with the clinical pharmacist In office.  PCP : Susy Frizzle, MD  Their chronic conditions include: CAD, HTN, CHF, Type II DM, HLD.  Office Visits:  11/28/2019 Dennard Schaumann) - Patient claiming that ever since he started [place] he believes his breathing has worsened.  Patient wants second opinion from another pulmonologist, recommended follow up ECG to repeat his ejection fraction.  Consult Visit: 01/30/2020 Duane Boston, Pulmonology) - Continued Breo with samples provided, patient is reporting that he does not notice much difference but is assured that the goal of therapy is to preserve his current lung function.  01/01/2020 Duane Boston, Pulmonology) - Chest CT notes moderate pulmonary fibrosis, he was given Breo to use once daily as directed. He did an overnight sleep test which showed a desaturation of oxygen below 88%, so he was started on overnight supplemental oxygen.   Medications: Outpatient Encounter Medications as of 03/20/2020  Medication Sig   allopurinol (ZYLOPRIM) 100 MG tablet TAKE 1 TABLET EVERY DAY   aspirin EC 81 MG tablet Take 1 tablet (81 mg total) by mouth daily. (Patient taking differently: Take 81 mg by mouth every other day. )   metFORMIN (GLUCOPHAGE) 500 MG tablet TAKE 1 TABLET TWICE DAILY WITH MEALS (DISCONTINUE JARDIANCE)   nitroGLYCERIN (NITROSTAT) 0.4 MG SL tablet Place 1 tablet (0.4 mg total) under the tongue every 5 (five) minutes x 3 doses as needed for chest pain.   Omega-3 Fatty Acids (FISH OIL PEARLS PO) Take 500 mg by mouth daily.   pantoprazole (PROTONIX) 40 MG tablet Take 1 tablet (40 mg total) by mouth daily.   rosuvastatin (CRESTOR) 40 MG tablet TAKE 1 TABLET EVERY DAY   sacubitril-valsartan (ENTRESTO) 49-51 MG Take 1 tablet by mouth 2 (two) times daily.    triamcinolone cream (KENALOG) 0.1 % Apply 1 application topically 2 (two) times daily.   No facility-administered encounter medications on file as of 03/20/2020.     Current Diagnosis/Assessment:   Merchant navy officer: Low Risk    Difficulty of Paying Living Expenses: Not very hard    Goals Addressed             This Visit's Progress    Pharmacy Care Plan:       CARE PLAN ENTRY (see longitudinal plan of care for additional care plan information)  Current Barriers:  Chronic Disease Management support, education, and care coordination needs related to Hypertension, Hyperlipidemia, Diabetes, and Heart Failure   Hypertension BP Readings from Last 3 Encounters:  11/28/19 110/66  11/16/19 108/68  09/21/19 100/60   Pharmacist Clinical Goal(s): Over the next 180 days, patient will work with PharmD and providers to maintain BP goal <130/80 Current regimen:  Entresto 49-42m Interventions: Discussed medication adherence Reviewed most recent office BP Patient self care activities - Over the next 180 days, patient will: Check BP periodically, document, and provide at future appointments Ensure daily salt intake < 2300 mg/day Focus on medication adherence  Hyperlipidemia Lab Results  Component Value Date/Time   LDLCALC 69 08/01/2019 08:35 AM   LDLDIRECT 58 08/01/2019 08:35 AM   Pharmacist Clinical Goal(s): Over the next 180 days, patient will work with PharmD and providers to maintain LDL goal < 70 Current regimen:  Rosuvastatin 477mdaily Interventions: Counseled on importance of statin medications Discussed goals of therapy with  most recent lipid panel Patient self care activities - Over the next 180 days, patient will: Continue to focus on medication adherence using pill box  Diabetes Lab Results  Component Value Date/Time   HGBA1C 6.4 (H) 11/10/2018 04:06 PM   HGBA1C 6.6 (H) 09/24/2017 10:03 AM   Pharmacist Clinical Goal(s): Over the next 180 days,  patient will work with PharmD and providers to achieve A1c goal <7% Current regimen:  Metformin 575m twice daily with meals Interventions: Reviewed most recent A1c Discussed importance of medication adherence Patient self care activities - Over the next 180 days, patient will: Check blood sugar at OVs, document, and provide at future appointments Contact provider with any episodes of hypoglycemia  Heart Failure Pharmacist Clinical Goal(s) Over the next 180 days, patient will work with PharmD and providers to optimize medication and minimize symptoms of HF Current regimen:  Entresto 49-552mInterventions: Identified barrier of medication adherence Discussed memory aids for evening medication administration Patient self care activities - Over the next 180 days, patient will: Begin taking Entresto twice daily as directed Contact providers with any new or worsening symptoms  Initial goal documentation         Heart Failure    Last ejection fraction: 38% NYHA Class: III (marked limitation of activity)   Patient has failed these meds in past: Brilinta (SOB) Patient is currently uncontrolled on the following medications:  Entresto 49-51 mg  We discussed  He admits to only taking Entresto once daily, because he forgets to take the second dose due to inconvenience and forgetfulness.   Counseled on medication adherence and encouraged him to put medication beside bedside tablet He denies swelling in extremities.  Plan  Continue current medications   Hypertension   BP goal is:  <130/80  Office blood pressures are  BP Readings from Last 3 Encounters:  11/28/19 110/66  11/16/19 108/68  09/21/19 100/60   Patient checks BP at home infrequently Patient home BP readings are ranging:  No logs available  Patient has failed these meds in the past: none noted Patient is currently controlled on the following medications:  Entresto 49-5132m HF medication controlling  BP  We discussed  BP is controlled, he denies headaches and dizziness He is not as active as he used to be due to shortness of breath.   Plan  Continue current medications     Diabetes   A1c goal <7%  Recent Relevant Labs: Lab Results  Component Value Date/Time   HGBA1C 6.4 (H) 11/10/2018 04:06 PM   HGBA1C 6.6 (H) 09/24/2017 10:03 AM   MICROALBUR 0.7 11/11/2018 09:21 AM   MICROALBUR 1.3 11/13/2016 12:36 PM    Last diabetic Eye exam:  Lab Results  Component Value Date/Time   HMDIABEYEEXA No Retinopathy 02/10/2019 12:00 AM    Last diabetic Foot exam: No results found for: HMDIABFOOTEX   Checking BG: Never   Patient has failed these meds in past: none noted Patient is currently controlled on the following medications: Metformin 500m57mice daily with meals  We discussed:  He admits to inconsistency with metformin. Most days he forgets to take the second dose.  A1c however, remains controlled. He does admit to severe neuropathy in his feet that really bother him at bedtime.  History of no sensation with monofilament. He expressed interest in something to treat/cure this. Will consult with PCP about gabapentin use in the setting of HF.  Plan  Continue current medications, recommend Metformin twice daily. Hyperlipidemia   LDL goal <  70  Lipid Panel     Component Value Date/Time   CHOL 134 08/01/2019 0835   TRIG 200 (H) 08/01/2019 0835   HDL 38 (L) 08/01/2019 0835   LDLCALC 69 08/01/2019 0835   LDLDIRECT 58 08/01/2019 0835    Hepatic Function Latest Ref Rng & Units 07/24/2019 11/10/2018 09/24/2017  Total Protein 6.1 - 8.1 g/dL 7.9 7.8 8.0  Albumin 3.6 - 5.1 g/dL - - -  AST 10 - 35 U/L _0 ALT 9 - 46 U/L _1 Alk Phosphatase 40 - 115 U/L - - -  Total Bilirubin 0.2 - 1.2 mg/dL 0.5 0.6 0.6  Bilirubin, Direct 0.0 - 0.3 mg/dL - - -     The ASCVD Risk score (Camas., et al., 2013) failed to calculate for the following reasons:   The patient has a  prior MI or stroke diagnosis   Patient has failed these meds in past: statins Patient is currently controlled on the following medications:  Rosuvastatin 20m  We discussed:   He reports adherence with medication, denies myalgias. LDL well controlled, discussed elevation of TG's. Counseled on goals of therapy TG < 150.  Plan  Continue current medications CAD   Patient has failed these meds in past: none noted Patient is currently controlled on the following medications:  ASA 873m We discussed:   Denies abnormal bleeding or bruising Controlling risk factors.  Plan  Continue current medications Vaccines   Reviewed and discussed patient's vaccination history.    Immunization History  Administered Date(s) Administered   PFIZER SARS-COV-2 Vaccination 07/09/2019, 08/08/2019   Pneumococcal Conjugate-13 11/13/2016   Pneumococcal Polysaccharide-23 08/18/2013   Tdap 11/13/2016    Plan  Recommended patient receive flu vaccine in office.  Medication Management   Miscellaneous medications:  Allopurinol 10056mantoprazole 22m66miamcinolone 0.1% Cream OTC's:  Omega-3 Fatty Acids Patient currently uses [facility] mail order pharmacy.   Patient reports using pill box method to organize medications and promote adherence. Patient reports missed doses of medication mainly in the evening.  Discussed leaving medication by nightstand or on bathroom sink to help him remember.   ChriBeverly MilcharmD Clinical Pharmacist [phone] collaborated with the care management provider regarding care management and care coordination activities outlined in this encounter and have reviewed this encounter including documentation in the note and care plan. I am certifying that I agree with the content of this note and encounter as supervising physician.

## 2020-03-20 ENCOUNTER — Ambulatory Visit: Payer: Medicare HMO

## 2020-03-20 ENCOUNTER — Other Ambulatory Visit: Payer: Self-pay

## 2020-03-20 DIAGNOSIS — I509 Heart failure, unspecified: Secondary | ICD-10-CM

## 2020-03-20 DIAGNOSIS — I10 Essential (primary) hypertension: Secondary | ICD-10-CM

## 2020-03-20 DIAGNOSIS — E782 Mixed hyperlipidemia: Secondary | ICD-10-CM

## 2020-03-20 DIAGNOSIS — E119 Type 2 diabetes mellitus without complications: Secondary | ICD-10-CM

## 2020-03-20 NOTE — Patient Instructions (Addendum)
Visit Information Thank you for meeting with me today!  I look forward to working with you to help you meet all of your healthcare goals and answer any questions you may have.  Feel free to contact me anytime!  Goals Addressed            This Visit's Progress   . Pharmacy Care Plan:       CARE PLAN ENTRY (see longitudinal plan of care for additional care plan information)  Current Barriers:  . Chronic Disease Management support, education, and care coordination needs related to Hypertension, Hyperlipidemia, Diabetes, and Heart Failure   Hypertension BP Readings from Last 3 Encounters:  11/28/19 110/66  11/16/19 108/68  09/21/19 100/60   . Pharmacist Clinical Goal(s): o Over the next 180 days, patient will work with PharmD and providers to maintain BP goal <130/80 . Current regimen:  o Entresto 49-51mg  . Interventions: o Discussed medication adherence o Reviewed most recent office BP . Patient self care activities - Over the next 180 days, patient will: o Check BP periodically, document, and provide at future appointments o Ensure daily salt intake < 2300 mg/day o Focus on medication adherence  Hyperlipidemia Lab Results  Component Value Date/Time   LDLCALC 69 08/01/2019 08:35 AM   LDLDIRECT 58 08/01/2019 08:35 AM   . Pharmacist Clinical Goal(s): o Over the next 180 days, patient will work with PharmD and providers to maintain LDL goal < 70 . Current regimen:  o Rosuvastatin 40mg  daily . Interventions: o Counseled on importance of statin medications o Discussed goals of therapy with most recent lipid panel . Patient self care activities - Over the next 180 days, patient will: o Continue to focus on medication adherence using pill box  Diabetes Lab Results  Component Value Date/Time   HGBA1C 6.4 (H) 11/10/2018 04:06 PM   HGBA1C 6.6 (H) 09/24/2017 10:03 AM   . Pharmacist Clinical Goal(s): o Over the next 180 days, patient will work with PharmD and providers to  achieve A1c goal <7% . Current regimen:  o Metformin 500mg  twice daily with meals . Interventions: o Reviewed most recent A1c o Discussed importance of medication adherence . Patient self care activities - Over the next 180 days, patient will: o Check blood sugar at OVs, document, and provide at future appointments o Contact provider with any episodes of hypoglycemia  Heart Failure . Pharmacist Clinical Goal(s) o Over the next 180 days, patient will work with PharmD and providers to optimize medication and minimize symptoms of HF . Current regimen:  o Entresto 49-51mg  . Interventions: o Identified barrier of medication adherence o Discussed memory aids for evening medication administration . Patient self care activities - Over the next 180 days, patient will: o Begin taking Entresto twice daily as directed o Contact providers with any new or worsening symptoms  Initial goal documentation        Mr. Ledvina was given information about Chronic Care Management services today including:  1. CCM service includes personalized support from designated clinical staff supervised by his physician, including individualized plan of care and coordination with other care providers 2. 24/7 contact phone numbers for assistance for urgent and routine care needs. 3. Standard insurance, coinsurance, copays and deductibles apply for chronic care management only during months in which we provide at least 20 minutes of these services. Most insurances cover these services at 100%, however patients may be responsible for any copay, coinsurance and/or deductible if applicable. This service may help you avoid the  need for more expensive face-to-face services. 4. Only one practitioner may furnish and bill the service in a calendar month. 5. The patient may stop CCM services at any time (effective at the end of the month) by phone call to the office staff.  Patient agreed to services and verbal consent obtained.    The patient verbalized understanding of instructions provided today and agreed to receive a mailed copy of patient instruction and/or educational materials. Telephone follow up appointment with pharmacy team member scheduled for: 25 months  Calistro Rauf, PharmD Clinical Pharmacist [facility] [phone]    Diabetes Mellitus and Nutrition, Adult When you have diabetes (diabetes mellitus), it is very important to have healthy eating habits because your blood sugar (glucose) levels are greatly affected by what you eat and drink. Eating healthy foods in the appropriate amounts, at about the same times every day, can help you:  Control your blood glucose.  Lower your risk of heart disease.  Improve your blood pressure.  Reach or maintain a healthy weight. Every person with diabetes is different, and each person has different needs for a meal plan. Your health care provider may recommend that you work with a diet and nutrition specialist (dietitian) to make a meal plan that is best for you. Your meal plan may vary depending on factors such as:  The calories you need.  The medicines you take.  Your weight.  Your blood glucose, blood pressure, and cholesterol levels.  Your activity level.  Other health conditions you have, such as heart or kidney disease. How do carbohydrates affect me? Carbohydrates, also called carbs, affect your blood glucose level more than any other type of food. Eating carbs naturally raises the amount of glucose in your blood. Carb counting is a method for keeping track of how many carbs you eat. Counting carbs is important to keep your blood glucose at a healthy level, especially if you use insulin or take certain oral diabetes medicines. It is important to know how many carbs you can safely have in each meal. This is different for every person. Your dietitian can help you calculate how many carbs you should have at each meal and for each  snack. Foods that contain carbs include:  Bread, cereal, rice, pasta, and crackers.  Potatoes and corn.  Peas, beans, and lentils.  Milk and yogurt.  Fruit and juice.  Desserts, such as cakes, cookies, ice cream, and candy. How does alcohol affect me? Alcohol can cause a sudden decrease in blood glucose (hypoglycemia), especially if you use insulin or take certain oral diabetes medicines. Hypoglycemia can be a life-threatening condition. Symptoms of hypoglycemia (sleepiness, dizziness, and confusion) are similar to symptoms of having too much alcohol. If your health care provider says that alcohol is safe for you, follow these guidelines:  Limit alcohol intake to no more than 1 drink per day for nonpregnant women and 2 drinks per day for men. One drink equals 12 oz of beer, 5 oz of wine, or 1 oz of hard liquor.  Do not drink on an empty stomach.  Keep yourself hydrated with water, diet soda, or unsweetened iced tea.  Keep in mind that regular soda, juice, and other mixers may contain a lot of sugar and must be counted as carbs. What are tips for following this plan?  Reading food labels  Start by checking the serving size on the "Nutrition Facts" label of packaged foods and drinks. The amount of calories, carbs, fats, and  other nutrients listed on the label is based on one serving of the item. Many items contain more than one serving per package.  Check the total grams (g) of carbs in one serving. You can calculate the number of servings of carbs in one serving by dividing the total carbs by 15. For example, if a food has 30 g of total carbs, it would be equal to 2 servings of carbs.  Check the number of grams (g) of saturated and trans fats in one serving. Choose foods that have low or no amount of these fats.  Check the number of milligrams (mg) of salt (sodium) in one serving. Most people should limit total sodium intake to less than 2,300 mg per day.  Always check the  nutrition information of foods labeled as "low-fat" or "nonfat". These foods may be higher in added sugar or refined carbs and should be avoided.  Talk to your dietitian to identify your daily goals for nutrients listed on the label. Shopping  Avoid buying canned, premade, or processed foods. These foods tend to be high in fat, sodium, and added sugar.  Shop around the outside edge of the grocery store. This includes fresh fruits and vegetables, bulk grains, fresh meats, and fresh dairy. Cooking  Use low-heat cooking methods, such as baking, instead of high-heat cooking methods like deep frying.  Cook using healthy oils, such as olive, canola, or sunflower oil.  Avoid cooking with butter, cream, or high-fat meats. Meal planning  Eat meals and snacks regularly, preferably at the same times every day. Avoid going long periods of time without eating.  Eat foods high in fiber, such as fresh fruits, vegetables, beans, and whole grains. Talk to your dietitian about how many servings of carbs you can eat at each meal.  Eat 4-6 ounces (oz) of lean protein each day, such as lean meat, chicken, fish, eggs, or tofu. One oz of lean protein is equal to: ? 1 oz of meat, chicken, or fish. ? 1 egg. ?  cup of tofu.  Eat some foods each day that contain healthy fats, such as avocado, nuts, seeds, and fish. Lifestyle  Check your blood glucose regularly.  Exercise regularly as told by your health care provider. This may include: ? 150 minutes of moderate-intensity or vigorous-intensity exercise each week. This could be brisk walking, biking, or water aerobics. ? Stretching and doing strength exercises, such as yoga or weightlifting, at least 2 times a week.  Take medicines as told by your health care provider.  Do not use any products that contain nicotine or tobacco, such as cigarettes and e-cigarettes. If you need help quitting, ask your health care provider.  Work with a Social worker or diabetes  educator to identify strategies to manage stress and any emotional and social challenges. Questions to ask a health care provider  Do I need to meet with a diabetes educator?  Do I need to meet with a dietitian?  What number can I call if I have questions?  When are the best times to check my blood glucose? Where to find more information:  American Diabetes Association: diabetes.org  Academy of Nutrition and Dietetics: www.eatright.[employer] of Diabetes and Digestive and Kidney Diseases (NIH): [employer] Summary  A healthy meal plan will help you control your blood glucose and maintain a healthy lifestyle.  Working with a diet and nutrition specialist (dietitian) can help you make a meal plan that is best for you.  Keep in mind that  carbohydrates (carbs) and alcohol have immediate effects on your blood glucose levels. It is important to count carbs and to use alcohol carefully. This information is not intended to replace advice given to you by your health care provider. Make sure you discuss any questions you have with your health care provider. Document Revised: 04/23/2017 Document Reviewed: 06/15/2016 Elsevier Patient Education  2020 [employer].

## 2020-03-21 ENCOUNTER — Telehealth: Payer: Self-pay | Admitting: Pharmacist

## 2020-03-21 DIAGNOSIS — G4733 Obstructive sleep apnea (adult) (pediatric): Secondary | ICD-10-CM | POA: Diagnosis not present

## 2020-03-21 NOTE — Chronic Care Management (AMB) (Addendum)
Called patient to discuss addition of gabapentin 300mg  hs, he has not opted to wait to try this due to potential adverse effects he researched.  He will call us if he changes his mind or pain worsens to where he wants to try it.  Beverly Milch, PharmD Clinical Pharmacist [place] [phone] I have collaborated with the care management provider regarding care management and care coordination activities outlined in this encounter and have reviewed this encounter including documentation in the note and care plan. I am certifying that I agree with the content of this note and encounter as supervising physician.

## 2020-03-21 NOTE — Telephone Encounter (Signed)
-----   Message from Susy Frizzle, MD sent at 03/21/2020  6:13 AM EDT ----- I am fine with him trying that.   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  I saw previously he has no sensation to monofilament during one of his OV's with you.  He was asking for something to treat this.  Wondering your thoughts on trial of gabapentin 300mg  hs, given his current HF and risk for fluid retention?

## 2020-03-25 DIAGNOSIS — G4733 Obstructive sleep apnea (adult) (pediatric): Secondary | ICD-10-CM | POA: Insufficient documentation

## 2020-03-26 ENCOUNTER — Other Ambulatory Visit: Payer: Self-pay | Admitting: *Deleted

## 2020-03-26 DIAGNOSIS — I10 Essential (primary) hypertension: Secondary | ICD-10-CM

## 2020-03-26 DIAGNOSIS — E119 Type 2 diabetes mellitus without complications: Secondary | ICD-10-CM

## 2020-03-26 DIAGNOSIS — I509 Heart failure, unspecified: Secondary | ICD-10-CM

## 2020-03-26 DIAGNOSIS — J8410 Pulmonary fibrosis, unspecified: Secondary | ICD-10-CM

## 2020-03-26 DIAGNOSIS — E782 Mixed hyperlipidemia: Secondary | ICD-10-CM

## 2020-04-24 ENCOUNTER — Other Ambulatory Visit: Payer: Self-pay

## 2020-04-24 ENCOUNTER — Ambulatory Visit: Payer: Medicare HMO

## 2020-04-24 DIAGNOSIS — I6523 Occlusion and stenosis of bilateral carotid arteries: Secondary | ICD-10-CM

## 2020-04-25 ENCOUNTER — Other Ambulatory Visit: Payer: Self-pay | Admitting: Cardiology

## 2020-04-25 DIAGNOSIS — I6521 Occlusion and stenosis of right carotid artery: Secondary | ICD-10-CM

## 2020-04-25 DIAGNOSIS — I6523 Occlusion and stenosis of bilateral carotid arteries: Secondary | ICD-10-CM

## 2020-05-14 ENCOUNTER — Encounter: Payer: Self-pay | Admitting: Cardiology

## 2020-05-14 ENCOUNTER — Ambulatory Visit: Payer: Medicare HMO | Admitting: Cardiology

## 2020-05-14 ENCOUNTER — Other Ambulatory Visit: Payer: Self-pay

## 2020-05-14 VITALS — BP 138/98 | HR 60 | Resp 16 | Ht 68.0 in | Wt 237.0 lb

## 2020-05-14 DIAGNOSIS — I5042 Chronic combined systolic (congestive) and diastolic (congestive) heart failure: Secondary | ICD-10-CM

## 2020-05-14 DIAGNOSIS — I25118 Atherosclerotic heart disease of native coronary artery with other forms of angina pectoris: Secondary | ICD-10-CM

## 2020-05-14 DIAGNOSIS — I6521 Occlusion and stenosis of right carotid artery: Secondary | ICD-10-CM

## 2020-05-14 DIAGNOSIS — E119 Type 2 diabetes mellitus without complications: Secondary | ICD-10-CM

## 2020-05-14 DIAGNOSIS — E782 Mixed hyperlipidemia: Secondary | ICD-10-CM | POA: Diagnosis not present

## 2020-05-14 DIAGNOSIS — R0609 Other forms of dyspnea: Secondary | ICD-10-CM | POA: Diagnosis not present

## 2020-05-14 NOTE — Progress Notes (Signed)
Primary Physician/Referring:  Susy Frizzle, MD  Patient ID: Chris Reasons., male    DOB: 1940/12/21, 79 y.o.   MRN: [id number]  Chief Complaint  Patient presents with  . Congestive Heart Failure  . Coronary Artery Disease  . Follow-up   HPI:    Chris [place].  is a 79 y.o. Caucasian male patient with hypertension, hyperlipidemia, diabetes mellitus diagnosed in 2018, CAD,hx of MI with CABG S/P multiple coronary interventions,  H/O stroke in past without residual deficits, chronic systolic and diastolic CHF with EF 123456 and asymptomatic carotid artery stenosis who presents to the clinic for evaluation of CHF and new worsening dyspnea.  Patient had a coronary angiogram on 01/2014 which demonstrated native LAD 100, LCX 100, patent grafts and also patent stents. He underwent nuclear stress test on 04/11/2018 which had revealed large inferolateral scar with very minimal peri-infarct ischemia with EF 38% considered to be high risk, but clinically I felt it was low risk and recommended aggressive medical therapy. Echo confirmed low LVEF also.  Symptoms started about 2 to 3 weeks ago 1 with rapid onset of dyspnea.  Due to elevated D-dimer, CT angiogram of the chest was performed which revealed scarring in the lungs, that appeared to be chronic.  He is now referred for further evaluation of new onset dyspnea and possible consideration for cardiac catheterization.  Past Medical History:  Diagnosis Date  . Abnormal exercise myocardial perfusion study    03/2018- 38% ef, see report  . Arthritis   . Chronic kidney disease   . Congestive heart failure (CHF) ([place]) 5/17   EF 35-40% [name]  . Coronary artery disease    a. 1995 s/p CABG x 3 (VG->RCA, LIMA->LAD, VG->OM);  b. 07/2008 Inf MI/Cath/PCI: VG->RCA 99 - treated with Taxus DES (36mm), LIMA and VG->OM patent, LAD 100, LCX 100, RCA 60d, EF 50%;  c. 09/2008 PCI native RCA  w/ 2.5x23 Xience DES, VG->RCA stent patent;  c. 05/2010  Cath: Native 3VD with 3/3 patent grafts, native RCA w 80% ISR prox to graft insertion-->Med Rx.  . DOE (dyspnea on exertion)   . ED (erectile dysfunction)   . GERD (gastroesophageal reflux disease)    occ  . Gout   . H/O hiatal hernia   . Hyperlipidemia   . Hypertension   . Myocardial infarction ([place])   . Neuropathy    toes  . Obesity   . RBBB (right bundle branch block)   . Stenosis of right internal carotid artery    50-69% (2014)  . Type II diabetes mellitus ([place])   . Umbilical hernia    a. s/p repair.  . Vertigo    Past Surgical History:  Procedure Laterality Date  . ANKLE ARTHROSCOPY WITH DRILLING/MICROFRACTURE Left 04/13/2013   Procedure: LEFT ANKLE ARTHROSCOPY WITH EXTENSIVE DEBRIEDMENT;  Surgeon: Wylene Simmer, MD;  Location: [facility];  Service: Orthopedics;  Laterality: Left;  . ANKLE SURGERY Left 08/18/2013   DR HEWITT  . CARDIAC CATHETERIZATION  2000   stents x3  . CARDIAC CATHETERIZATION  02/15/2014   Procedure: LEFT HEART CATH AND CORS/GRAFTS ANGIOGRAPHY;  Surgeon: Laverda Page, MD;  Location: [facility] CATH LAB;  Service: Cardiovascular;;  . COLONOSCOPY    . CORONARY ARTERY BYPASS GRAFT  1995   LIMA to LAD, SVG to RCA, SVG to OM.   Marland Kitchen TOTAL ANKLE ARTHROPLASTY Left 08/17/2013   Procedure: LEFT TOTAL ANKLE REPLACEMENT WITH POSSIBLE GASTROC RECESSION ;  Surgeon:  Wylene Simmer, MD;  Location: [facility];  Service: Orthopedics;  Laterality: Left;  . UMBILICAL HERNIA REPAIR  12/2007   Family History  Problem Relation Age of Onset  . Aneurysm Mother   . Heart disease Father   . Heart attack Father   . Stroke Brother     Social History   Tobacco Use  . Smoking status: Former Smoker    Packs/day: 1.00    Years: 9.00    Pack years: 9.00    Types: Cigarettes    Quit date: 10/31/1970    Years since quitting: 49.6  . Smokeless tobacco: Former Systems developer    Types: Chew  . Tobacco comment: chewed for 2 years  Substance Use Topics  . Alcohol use: Not Currently     Comment: occ wine last 6 months   ROS  Review of Systems  Cardiovascular: Positive for dyspnea on exertion. Negative for chest pain and leg swelling.  Gastrointestinal: Negative for melena.   Objective  Blood pressure (!) 138/98, pulse 60, resp. rate 16, height 5\' 8"  (1.727 m), weight 237 lb (107.5 kg), SpO2 98 %.  Vitals with BMI 05/23/2020 05/14/2020 11/28/2019  Height 5\' 8"  5\' 8"  5\' 8"   Weight 232 lbs 237 lbs 232 lbs  BMI 35.28 [id number]  Systolic [id number]  Diastolic 70 98 66  Pulse 92 60 79     Physical Exam Constitutional:      General: He is not in acute distress.    Appearance: He is well-developed.  Neck:     Vascular: No JVD.  Cardiovascular:     Rate and Rhythm: Normal rate and regular rhythm.     Pulses:          Carotid pulses are 2+ on the right side and 2+ on the left side.    Heart sounds: Normal heart sounds. No murmur heard. No gallop.      Comments: No JVD, No leg edema.  Fem and Pop pulse difficult to feel due to body habitus.  DP  Normal and PT absent bilateral Pulmonary:     Effort: Pulmonary effort is normal.     Breath sounds: Rales (left worse than right coarse crackles) present.  Abdominal:     General: Bowel sounds are normal.     Palpations: Abdomen is soft.  Musculoskeletal:        General: Normal range of motion.    Laboratory examination:   Recent Labs    07/24/19 1035 08/01/19 0835 05/15/20 1000  NA 141 142 141  K 4.8 4.5 4.7  CL 103 106 104  CO2 27 27 21   GLUCOSE 83 127* 134*  BUN 15 14 13   CREATININE 1.22* 1.44* 1.24  CALCIUM 10.1 10.0 9.6  GFRNONAA 57*  --  56*  GFRAA 66  --  64   estimated creatinine clearance is 59.3 mL/min (by C-G formula based on SCr of 1.24 mg/dL).  CMP Latest Ref Rng & Units 05/15/2020 08/01/2019 07/24/2019  Glucose 65 - 99 mg/dL 134(H) 127(H) 83  BUN 8 - 27 mg/dL 13 14 15   Creatinine 0.76 - 1.27 mg/dL 1.24 1.44(H) 1.22(H)  Sodium 134 - 144 mmol/L 141 142 141  Potassium 3.5 - 5.2 mmol/L 4.7 4.5 4.8   Chloride 96 - 106 mmol/L 104 106 103  CO2 20 - 29 mmol/L 21 27 27   Calcium 8.6 - 10.2 mg/dL 9.6 10.0 10.1  Total Protein 6.1 - 8.1 g/dL - - 7.9  Total Bilirubin 0.2 -  1.2 mg/dL - - 0.5  Alkaline Phos 40 - 115 U/L - - -  AST 10 - 35 U/L - - 21  ALT 9 - 46 U/L - - 15   CBC Latest Ref Rng & Units 09/21/2019 07/24/2019 11/10/2018  WBC 4.0 - 10.5 K/uL 8.8 9.3 7.2  Hemoglobin 13.0 - 17.0 g/dL 14.6 15.3 15.5  Hematocrit 39.0 - 52.0 % 44.1 44.9 45.1  Platelets 150 - 400 K/uL 202 268 215   Lipid Panel Recent Labs    08/01/19 0835 05/15/20 1000  CHOL 134 183  TRIG 200* 344*  LDLCALC 69 87  HDL 38* 40  CHOLHDL 3.5  --   LDLDIRECT 58  --     HEMOGLOBIN A1C Lab Results  Component Value Date   HGBA1C 7.0 (H) 05/15/2020   MPG 137 11/10/2018   TSH Recent Labs    08/01/19 0835  TSH 4.86*    Medications and allergies   Allergies  Allergen Reactions  . Brilinta [Ticagrelor] Shortness Of Breath  . Crestor [Rosuvastatin Calcium] Other (See Comments)    myalgia  . Lipitor [Atorvastatin] Other (See Comments)    Intolerable muscle pain all over     Current Outpatient Medications on File Prior to Visit  Medication Sig Dispense Refill  . allopurinol (ZYLOPRIM) 100 MG tablet TAKE 1 TABLET EVERY DAY 90 tablet 3  . aspirin EC 81 MG tablet Take 1 tablet (81 mg total) by mouth daily. (Patient taking differently: Take 81 mg by mouth every other day.)    . metFORMIN (GLUCOPHAGE) 500 MG tablet TAKE 1 TABLET TWICE DAILY WITH MEALS (DISCONTINUE JARDIANCE) (Patient taking differently: Take 500 mg by mouth daily with breakfast.) 180 tablet 3  . nitroGLYCERIN (NITROSTAT) 0.4 MG SL tablet Place 1 tablet (0.4 mg total) under the tongue every 5 (five) minutes x 3 doses as needed for chest pain. 25 tablet 12  . pantoprazole (PROTONIX) 40 MG tablet Take 1 tablet (40 mg total) by mouth daily. 90 tablet 3  . rosuvastatin (CRESTOR) 40 MG tablet TAKE 1 TABLET EVERY DAY 90 tablet 1  . sacubitril-valsartan  (ENTRESTO) 49-51 MG Take 1 tablet by mouth 2 (two) times daily. 180 tablet 3  . triamcinolone cream (KENALOG) 0.1 % Apply 1 application topically 2 (two) times daily. 30 g 0   No current facility-administered medications on file prior to visit.    Radiology:   CT angio chest 07/25/2019: 1. No evidence of pulmonary embolism. 2. Scattered ground-glass density throughout both lungs with increased central and subpleural reticulation and areas of mosaic attenuation. No frank honeycombing or significant bronchiectasis. Differential considerations include chronic hypersensitivity pneumonitis or idiopathic interstitial pneumonia such as NSIP. Pulmonary consultation is suggested, as well as high-resolution chest CT follow-up in 6 months. 3. 5 mm pulmonary nodule along the right major fissure. No follow-up needed if patient is low-risk. Non-contrast chest CT can be considered in 12 months if patient is high-risk.  4.  Aortic atherosclerosis (ICD10-I70.0).  CT scan of the chest 10/04/2019:  1. 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 Findings are consistent with UIP per consensus guidelines. 2. Borderline mild cardiomegaly. 3. Moderate hiatal hernia. 4. Aortic Atherosclerosis    Cardiac Studies:   Coronary angiogram  02/15/2014:  Native LAD 100, LCX 100, patent grafts and also patent stents. SVG to RCA mid Taxus DES (52mm) patent, ( h/o Inf MI/Cath/PCI in 09/2008 PCI) native RCA w/ 2.5x23 Xience DES patent,  patent LIMA to LAD and SVG to OM patent,  RCA 60% distal. Small vessel disease.   Lexiscan myoview stress test 04/11/2018: 1. Lexiscan stress test was performed. Exercise capacity was not assessed. No stress symptoms reported. Blood pressure was normal.  The resting and stress electrocardiogram demonstrated normal sinus rhythm, normal resting conduction, no resting arrhythmias, old inferior and  posterior infarct, normal rest repolarization.  Stress EKG is non diagnostic for ischemia as it is a pharmacologic stress. 2. The overall quality of the study is good.  Left ventricular cavity is noted to be normal on the rest and stress studies.  Gated SPECT imaging demonstrates hypokinesis of the basal inferior, basal inferolateral, mid inferior, mid inferolateral, apical inferior and apical lateral myocardial wall(s).  The left ventricular ejection fraction was calculated or visually estimated to be 38%. LSPECT images reveal a large sized, medium intensity, minimally reversible perfusion defect suggestive of large infarct with minimal peri infarct ischemia in LCx/PDA territory.   3. High risk study.  Overnight oximetry test 10/11/2019  Desaturation below 88% 17.6 minutes.  Echocardiogram 08/09/2019:  Left ventricle cavity is normal in size. Moderate concentric hypertrophy of the left ventricle.  Left ventricle regional wall motion findings: Basal inferolateral, Basal inferior, Mid inferolateral and Mid inferior akinesis. Moderate decrease in global wall motion. Visual EF is 35-40%. Doppler evidence of grade II (pseudonormal) diastolic dysfunction, elevated LAP.  c.f. echo. of 03/15/2018, no significant change.   Carotid artery duplex 04/24/2020: Stenosis in the right internal carotid artery (50-69%). Stenosis in the right external carotid artery (<50%). Stenosis in the left internal carotid artery (16-49%).  Antegrade right vertebral artery flow. Antegrade left vertebral artery flow. Follow up in six months is appropriate if clinically indicated. No significant change from 11/20/2019.  EKG    EKG 07/31/2019: Normal sinus rhythm at the rate of 69 bpm, left atrial enlargement, right axis deviation, right bundle branch block.  Bifascicular block.  Low-voltage complexes.  Pulmonary disease pattern. No significant change from   EKG 07/13/2018    Assessment   1. Coronary artery disease of native  artery of native heart with stable angina pectoris ([place])   2. Asymptomatic carotid artery stenosis without infarction, right   3. Dyspnea on exertion   4. Chronic combined systolic and diastolic heart failure ([place])   5. Type 2 diabetes mellitus without complication, without long-term current use of insulin ([place])   6. Mixed hyperlipidemia     Meds ordered this encounter  Medications  . Icosapent Ethyl (VASCEPA) 0.5 g CAPS    Sig: Take 4 capsules by mouth 2 (two) times daily.    Dispense:  720 capsule    Refill:  3    Can substitute to 1000 mg capsule instead of 500 mg if followed by Payer   Medications Discontinued During This Encounter  Medication Reason  . Omega-3 Fatty Acids (FISH OIL PEARLS PO) Change in therapy    Orders Placed This Encounter  Procedures  . Lipid Panel With LDL/HDL Ratio  . Hgb A1c w/o eAG  . Basic metabolic panel  . PCV MYOCARDIAL PERFUSION WITH LEXISCAN    Standing Status:   Future    Standing Expiration Date:   07/24/2020  . EKG 12-Lead  . PCV ECHOCARDIOGRAM COMPLETE    Standing Status:   Future    Standing Expiration Date:   05/14/2021    Recommendations:   Chris Ruark.  is a 79 y.o. Caucasian male patient with hypertension, hyperlipidemia, diabetes mellitus diagnosed  in 2018, CAD,hx of MI with CABG S/P multiple coronary interventions,  H/O stroke in past without residual deficits, chronic systolic and diastolic CHF with EF 123456 and asymptomatic carotid artery stenosis who presents to the clinic for evaluation of CHF and new worsening dyspnea.  He is presently not on a beta-blocker, it was taken off 6 months ago due to marked bradycardia that was noted in Dr. Samella Parr office.  For CHF, he is on moderate dose Entresto. He will need repeat echocardiogram.  He will also need stress test, that was scheduled previously but patient did not keep the appointment.  Also concerned about potential progression of his CAD in view of uncontrolled lipids as  well.  Although his blood pressure was elevated today, he has had very low blood pressure and dizziness and hence I did not make any changes to his medications.  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  I have added Vascepa to treat his mixed hyperlipidemia, discussed regarding making dietary changes and weight loss.  Carotid artery duplex is remained stable.  We will continue surveillance.  I reviewed his labs, will forward to his PCP as well, diabetes is controlled but lipids being uncontrolled.  I would like to see him back in 6 weeks for follow-up.  I reviewed his external records from pulmonary consultation through care everywhere, updated his records and labs.  I spent 40 minutes for his office visit.   Adrian Prows, MD, [facility] 05/26/2020, 10:15 AM Office: [phone] Pager: [phone]

## 2020-05-15 DIAGNOSIS — E119 Type 2 diabetes mellitus without complications: Secondary | ICD-10-CM | POA: Diagnosis not present

## 2020-05-15 DIAGNOSIS — E782 Mixed hyperlipidemia: Secondary | ICD-10-CM | POA: Diagnosis not present

## 2020-05-16 LAB — LIPID PANEL WITH LDL/HDL RATIO
Cholesterol, Total: 183 mg/dL (ref 100–199)
HDL: 40 mg/dL (ref >39)
LDL Chol Calc (NIH): 87 mg/dL (ref 0–99)
LDL/HDL Ratio: 2.2 ratio (ref 0.0–3.6)
Triglycerides: 344 mg/dL — ABNORMAL HIGH (ref 0–149)
VLDL Cholesterol Cal: 56 mg/dL — ABNORMAL HIGH (ref 5–40)

## 2020-05-16 LAB — BASIC METABOLIC PANEL
BUN/Creatinine Ratio: 10 (ref 10–24)
BUN: 13 mg/dL (ref 8–27)
CO2: 21 mmol/L (ref 20–29)
Calcium: 9.6 mg/dL (ref 8.6–10.2)
Chloride: 104 mmol/L (ref 96–106)
Creatinine, Ser: 1.24 mg/dL (ref 0.76–1.27)
GFR calc Af Amer: 64 mL/min/{1.73_m2} (ref >59)
GFR calc non Af Amer: 56 mL/min/{1.73_m2} — ABNORMAL LOW (ref >59)
Glucose: 134 mg/dL — ABNORMAL HIGH (ref 65–99)
Potassium: 4.7 mmol/L (ref 3.5–5.2)
Sodium: 141 mmol/L (ref 134–144)

## 2020-05-16 LAB — HGB A1C W/O EAG: Hgb A1c MFr Bld: 7 % — ABNORMAL HIGH (ref 4.8–5.6)

## 2020-05-23 ENCOUNTER — Other Ambulatory Visit: Payer: Self-pay

## 2020-05-23 ENCOUNTER — Encounter: Payer: Self-pay | Admitting: Family Medicine

## 2020-05-23 ENCOUNTER — Ambulatory Visit (INDEPENDENT_AMBULATORY_CARE_PROVIDER_SITE_OTHER): Payer: Medicare HMO | Admitting: Family Medicine

## 2020-05-23 VITALS — BP 132/70 | HR 92 | Temp 97.9°F | Resp 16 | Ht 68.0 in | Wt 232.0 lb

## 2020-05-23 DIAGNOSIS — E782 Mixed hyperlipidemia: Secondary | ICD-10-CM

## 2020-05-23 DIAGNOSIS — E119 Type 2 diabetes mellitus without complications: Secondary | ICD-10-CM | POA: Diagnosis not present

## 2020-05-23 DIAGNOSIS — I509 Heart failure, unspecified: Secondary | ICD-10-CM

## 2020-05-23 MED ORDER — METFORMIN HCL ER 500 MG PO TB24: 1000.0000 mg | ORAL_TABLET | Freq: Every day | ORAL | 3 refills | Status: DC

## 2020-05-23 NOTE — Progress Notes (Signed)
Subjective:    Patient ID: Chris [name]., male    DOB: [birth year], 79 y.o.   MRN: [id number]  HPI  07/24/19 Patient presents today complaining of worsening dyspnea on exertion.  He states that over the last few weeks his breathing has dramatically worsened.  He states that he can barely walk from his car into my office (less than 100 feet) without becoming short of breath.  He has to stop and take a break.  Last week he was using a chainsaw to clean up some limbs that have fallen off of his trees during an ice storm and he literally had to lay on the ground due to trouble breathing to catch his breath.  He denies any orthopnea.  He denies any paroxysmal nocturnal dyspnea.  He denies any angina.  Patient had a CABG in 1995.  In 2010 he suffered a myocardial infarction.  He required a stent in the bypass venous graft of the right coronary artery.  Catheterization in 2012 showed native three-vessel disease with 3 out of 3 patent grafts but he did have an 80% stenosis of the right coronary artery proximal to the stent that was deemed to be treated medically.  Patient had a coronary angiogram on 01/2014 which demonstrated native LAD 100, LCX 100, patent grafts and also patent stents. He underwent nuclear stress test on 04/11/2018 which had revealed large inferolateral scar with very minimal peri-infarct ischemia with EF 38% considered to be high risk, but clinically I felt it was low risk and recommended aggressive medical therapy.     Most recent echocardiogram showed an ejection fraction of 35%.  He is currently taking losartan.  He is not on a beta-blocker.  He is not on spironolactone.  Last saw his cardiologist in the summer 2020.  EKG today shows normal sinus rhythm with a right bundle branch block.  There is no evidence of ischemia.  There is no significant change compared to his EKG from February 2020.  At that time, my plan was: Patient has profound shortness of breath and dyspnea on exertion  that I would categorize as NYHA class III.  Discontinue losartan and start the patient on Entresto 49/51, 1/2 tablet twice daily for 1 week and then increase to 1 tablet twice daily thereafter.  Obtain a chest x-ray to rule out other potential pulmonary causes of shortness of breath such as pneumonia or malignancy however his pulmonary exam is normal today.  Check a CBC to rule out anemia.  Check a D-dimer given the profound nature of his shortness of breath just to exclude the possibility of a pulmonary embolism however I feel most likely his symptoms are due to congestive heart failure.  I will consult his cardiologist as soon as possible to determine if the patient may need repeat coronary angiogram.  If patient tolerates [name], may also benefit from a beta-blocker to maximize medical therapy for CHF.    7/6/1 D-dimer was elevated and CT revealed: IMPRESSION: 1. 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 Findings are consistent with UIP per consensus guidelines: Diagnosis of Idiopathic Pulmonary Fibrosis: An Official ATS/ERS/JRS/ALAT Clinical Practice Guideline. [name], Iss 5, [phone], Jan 23 2017. 2. Borderline mild cardiomegaly. 3. Moderate hiatal hernia. 4. Aortic Atherosclerosis (ICD10-I70.0).  Patient saw pulmonology.  Pulmonary function test did show a mild restrictive pattern with an FEV1 to FVC ratio greater than 90% however  his lung volumes were not significantly diminished.  Patient seems extremely confused.  I believe there was miscommunication between he and the pulmonologist.  He has no follow-up plan with [name].  He states that ever since I added the [place], he believes his breathing has worsened.  However, the patient would like a second opinion with a different pulmonologist.  He was very confused after their office visit and was led to believe that  nothing was wrong.  Today on examination he has fine diffuse crackles bilaterally.  There is no pitting edema in his extremities and no sign of fluid overload.  At that time, my plan was: .I believe his dyspnea on exertion is likely multifactorial and is due to a combination of congestive heart failure as well as underlying pulmonary issues.  However I suspect that his pulmonary issues are playing a larger role in this at the present time based on his exam findings today as well as the CT scan findings and his pulmonary function test findings.  Patient would like a second opinion with a different pulmonologist at a tertiary care center.  I will try to arrange this.  In the meantime I would like him to follow-up with his cardiologist to have a repeat echocardiogram to monitor his ejection fraction.  05/23/20 Patient recently had lab work with his cardiologist that showed an A1c of 7.0 and triglycerides greater than 300.  He is on max dose Entresto.  He is only taking his Metformin 500 mg once a day because he forgets to take the evening dose.  He denies any polyuria, polydipsia, blurry vision.  He admits to eating a high carbohydrate diet including bread, potatoes, rice, and sweets.  He is not getting any regular exercise.  Past Medical History:  Diagnosis Date  . Abnormal exercise myocardial perfusion study    03/2018- 38% ef, see report  . Arthritis   . Chronic kidney disease   . Congestive heart failure (CHF) ([place]) 5/17   EF 35-40% [name]  . Coronary artery disease    a. 1995 s/p CABG x 3 (VG->RCA, LIMA->LAD, VG->OM);  b. 07/2008 Inf MI/Cath/PCI: VG->RCA 99 - treated with Taxus DES (33mm), LIMA and VG->OM patent, LAD 100, LCX 100, RCA 60d, EF 50%;  c. 09/2008 PCI native RCA  w/ 2.5x23 Xience DES, VG->RCA stent patent;  c. 05/2010 Cath: Native 3VD with 3/3 patent grafts, native RCA w 80% ISR prox to graft insertion-->Med Rx.  . DOE (dyspnea on exertion)   . ED (erectile dysfunction)   .  GERD (gastroesophageal reflux disease)    occ  . Gout   . H/O hiatal hernia   . Hyperlipidemia   . Hypertension   . Myocardial infarction ([place])   . Neuropathy    toes  . Obesity   . RBBB (right bundle branch block)   . Stenosis of right internal carotid artery    50-69% (2014)  . Type II diabetes mellitus ([place])   . Umbilical hernia    a. s/p repair.  . Vertigo     Past Surgical History:  Procedure Laterality Date  . ANKLE ARTHROSCOPY WITH DRILLING/MICROFRACTURE Left 04/13/2013   Procedure: LEFT ANKLE ARTHROSCOPY WITH EXTENSIVE DEBRIEDMENT;  Surgeon: Wylene Simmer, MD;  Location: [facility];  Service: Orthopedics;  Laterality: Left;  . ANKLE SURGERY Left 08/18/2013   DR HEWITT  . CARDIAC CATHETERIZATION  2000   stents x3  . CARDIAC CATHETERIZATION  02/15/2014   Procedure: LEFT HEART  CATH AND CORS/GRAFTS ANGIOGRAPHY;  Surgeon: Laverda Page, MD;  Location: [facility] CATH LAB;  Service: Cardiovascular;;  . COLONOSCOPY    . CORONARY ARTERY BYPASS GRAFT  1995   LIMA to LAD, SVG to RCA, SVG to OM.   Marland Kitchen TOTAL ANKLE ARTHROPLASTY Left 08/17/2013   Procedure: LEFT TOTAL ANKLE REPLACEMENT WITH POSSIBLE GASTROC RECESSION ;  Surgeon: Wylene Simmer, MD;  Location: [facility];  Service: Orthopedics;  Laterality: Left;  . UMBILICAL HERNIA REPAIR  12/2007   Current Outpatient Medications on File Prior to Visit  Medication Sig Dispense Refill  . allopurinol (ZYLOPRIM) 100 MG tablet TAKE 1 TABLET EVERY DAY 90 tablet 3  . aspirin EC 81 MG tablet Take 1 tablet (81 mg total) by mouth daily. (Patient taking differently: Take 81 mg by mouth every other day.)    . metFORMIN (GLUCOPHAGE) 500 MG tablet TAKE 1 TABLET TWICE DAILY WITH MEALS (DISCONTINUE JARDIANCE) (Patient taking differently: Take 500 mg by mouth daily with breakfast.) 180 tablet 3  . nitroGLYCERIN (NITROSTAT) 0.4 MG SL tablet Place 1 tablet (0.4 mg total) under the tongue every 5 (five) minutes x 3 doses as needed for chest pain. 25  tablet 12  . Omega-3 Fatty Acids (FISH OIL PEARLS PO) Take 500 mg by mouth daily.    . pantoprazole (PROTONIX) 40 MG tablet Take 1 tablet (40 mg total) by mouth daily. 90 tablet 3  . rosuvastatin (CRESTOR) 40 MG tablet TAKE 1 TABLET EVERY DAY 90 tablet 1  . sacubitril-valsartan (ENTRESTO) 49-51 MG Take 1 tablet by mouth 2 (two) times daily. 180 tablet 3  . triamcinolone cream (KENALOG) 0.1 % Apply 1 application topically 2 (two) times daily. 30 g 0   No current facility-administered medications on file prior to visit.   Allergies  Allergen Reactions  . Brilinta [Ticagrelor] Shortness Of Breath  . Crestor [Rosuvastatin Calcium] Other (See Comments)    myalgia  . Lipitor [Atorvastatin] Other (See Comments)    Intolerable muscle pain all over    Social History   Socioeconomic History  . Marital status: Married    Spouse name: Not on file  . Number of children: 4  . Years of education: Not on file  . Highest education level: Not on file  Occupational History  . Not on file  Tobacco Use  . Smoking status: Former Smoker    Packs/day: 1.00    Years: 9.00    Pack years: 9.00    Types: Cigarettes    Quit date: 10/31/1970    Years since quitting: 49.5  . Smokeless tobacco: Former Systems developer    Types: Chew  . Tobacco comment: chewed for 2 years  Vaping Use  . Vaping Use: Never used  Substance and Sexual Activity  . Alcohol use: Not Currently    Comment: occ wine last 6 months  . Drug use: No  . Sexual activity: Yes  Other Topics Concern  . Not on file  Social History Narrative   Lives in [place] with wife.  Retired.   Social Determinants of Health   Financial Resource Strain: Low Risk   . Difficulty of Paying Living Expenses: Not very hard  Food Insecurity: Not on file  Transportation Needs: Not on file  Physical Activity: Not on file  Stress: Not on file  Social Connections: Not on file  Intimate Partner Violence: Not on file     Review of Systems  All other systems  reviewed and are negative.  Objective:   Physical Exam Vitals reviewed.  Constitutional:      General: He is not in acute distress.    Appearance: He is obese. He is not ill-appearing or toxic-appearing.  Cardiovascular:     Rate and Rhythm: Normal rate and regular rhythm.     Heart sounds: No murmur heard. No friction rub. No gallop.   Pulmonary:     Effort: Pulmonary effort is normal. No respiratory distress.     Breath sounds: No stridor. No wheezing or rhonchi.  Abdominal:     General: Abdomen is flat. Bowel sounds are normal. There is no distension.     Palpations: Abdomen is soft.     Tenderness: There is no abdominal tenderness. There is no guarding.  Musculoskeletal:     Right lower leg: No edema.     Left lower leg: No edema.  Neurological:     Mental Status: He is alert.           Assessment & Plan:  Type 2 diabetes mellitus without complication, without long-term current use of insulin (HCC)  Congestive heart failure, unspecified HF chronicity, unspecified heart failure type (HCC)  Mixed hyperlipidemia  Due to his congestive heart failure, I would like to stop Metformin and start him on Jardiance 25 mg a day.  He will check on the price but he does not believe that he can afford that and the [facility] together.  Therefore we will start him on Metformin XR 1000 mg p.o. every morning to try to improve compliance.  I also recommended a low carbohydrate low saturated fat diet, exercise, and weight loss.  Ideally I like to see him try to get below 200 pounds.

## 2020-05-26 ENCOUNTER — Encounter: Payer: Self-pay | Admitting: Cardiology

## 2020-05-26 MED ORDER — VASCEPA 0.5 G PO CAPS: 4.0000 | ORAL_CAPSULE | Freq: Two times a day (BID) | ORAL | 3 refills | Status: DC

## 2020-05-26 NOTE — Progress Notes (Signed)
FYI. I also sent you my OV notes, sorry for the delay

## 2020-05-27 ENCOUNTER — Telehealth: Payer: Self-pay

## 2020-05-27 ENCOUNTER — Other Ambulatory Visit: Payer: Self-pay | Admitting: Family Medicine

## 2020-05-27 DIAGNOSIS — J8410 Pulmonary fibrosis, unspecified: Secondary | ICD-10-CM

## 2020-05-27 NOTE — Telephone Encounter (Signed)
Due to his congestive heart failure, I would like to stop Metformin and start him on Jardiance 25 mg a day.  He will check on the price but he does not believe that he can afford that and the [facility] together.  Therefore we will start him on Metformin XR 1000 mg p.o. every morning to try to improve compliance.  I also recommended a low carbohydrate low saturated fat diet, exercise, and weight loss.  Ideally I like to see him try to get below 200 pounds.   Call placed to patient and patient made aware. Patient states that e will check co- pay price.

## 2020-05-27 NOTE — Telephone Encounter (Signed)
Pt stated he had talked to Dr Tanya Nones about being put on Jardiance, t called and spoke with [facility] and wants to know what dosage, how many a day so he can know if it is covered or not.   Pt call back [phone]

## 2020-05-29 ENCOUNTER — Telehealth: Payer: Self-pay

## 2020-05-29 ENCOUNTER — Ambulatory Visit: Payer: Self-pay | Admitting: Pharmacist

## 2020-05-29 NOTE — Telephone Encounter (Signed)
Patient called asking for the in house pharmacist to give him a call [phone].

## 2020-05-29 NOTE — Chronic Care Management (AMB) (Signed)
  Chronic Care Management   Follow Up Note   05/29/2020 Name: Chris [name]. MRN: [id number] DOB: 04-24-1941  Referred by: Donita Brooks, MD Reason for referral : No chief complaint on file.   Chris [name]. is a 80 y.o. year old male who is a primary care patient of Chris, [name], MD. The CCM team was consulted for assistance with chronic disease management and care coordination needs.    Review of patient status, including review of consultants reports, relevant laboratory and other test results, and collaboration with appropriate care team members and the patient's provider was performed as part of comprehensive patient evaluation and provision of chronic care management services.    SDOH (Social Determinants of Health) assessments performed: No See Care Plan activities for detailed interventions related to [facility])     Outpatient Encounter Medications as of 05/29/2020  Medication Sig  . allopurinol (ZYLOPRIM) 100 MG tablet TAKE 1 TABLET EVERY DAY  . aspirin EC 81 MG tablet Take 1 tablet (81 mg total) by mouth daily. (Patient taking differently: Take 81 mg by mouth every other day.)  . Icosapent Ethyl (VASCEPA) 0.5 g CAPS Take 4 capsules by mouth 2 (two) times daily.  . metFORMIN (GLUCOPHAGE XR) 500 MG 24 hr tablet Take 2 tablets (1,000 mg total) by mouth daily with breakfast.  . metFORMIN (GLUCOPHAGE) 500 MG tablet TAKE 1 TABLET TWICE DAILY WITH MEALS (DISCONTINUE JARDIANCE) (Patient taking differently: Take 500 mg by mouth daily with breakfast.)  . nitroGLYCERIN (NITROSTAT) 0.4 MG SL tablet Place 1 tablet (0.4 mg total) under the tongue every 5 (five) minutes x 3 doses as needed for chest pain.  . pantoprazole (PROTONIX) 40 MG tablet Take 1 tablet (40 mg total) by mouth daily.  . rosuvastatin (CRESTOR) 40 MG tablet TAKE 1 TABLET EVERY DAY  . sacubitril-valsartan (ENTRESTO) 49-51 MG Take 1 tablet by mouth 2 (two) times daily.  Marland Kitchen triamcinolone cream (KENALOG) 0.1 % Apply 1  application topically 2 (two) times daily.   No facility-administered encounter medications on file as of 05/29/2020.     Objective:  Patient called to discuss some new medications he has been prescribed.  Goals Addressed   None     There are no care plans to display for this patient.  Dr. Tanya Nones wants to start him on Jardiance which he reported was $125 for a 3 months supply at his pharmacy.  Dr. Jacinto Halim wanted to start patient on Vascepa based of lipid panel and elevated triglycerides.  Patient was concerned that these medications were for the same thing and he would be doubling up.  Counseled patient that his [place] Pepper was for his blood sugar and had benefits in heart failure while his Vascepa was strictly for his elevated triglycerides.  Discussed the cost barrier of these two along with his Entresto.  Patient is going to try for a few months but will most likely enter the donut hole at home point.  Unfortunately he is over the income limit for most PAP programs.  Plan:   The patient has been provided with contact information for the care management team and has been advised to call with any health related questions or concerns.    [name], PharmD Clinical Pharmacist [facility] [phone]

## 2020-05-30 ENCOUNTER — Other Ambulatory Visit: Payer: Medicare HMO

## 2020-06-14 ENCOUNTER — Telehealth: Payer: Self-pay | Admitting: Pharmacist

## 2020-06-14 NOTE — Progress Notes (Signed)
° ° °  Chronic Care Management Pharmacy Assistant   Name: Chris [name].  MRN: [id number] DOB: 06-08-1940  Reason for Encounter: Adherence Review  Patient Questions:  1.  Have you seen any other providers since your last visit? No.   2.  Any changes in your medicines or health? No.    PCP : Susy Frizzle, MD  Verified Adherence Gap Information. Per insurance data, the patient is 100% compliant with the following medication: Rosuvastatin 40 mg and is 90-99% compliant with the following medication: Metformin 500 mg. Patient did not met their goal for annual wellness and wellness bundle screening. Their last A1C was 6.4 on 11/10/2018. The patients las blood pressure was 110/66 on 11/28/19.Their total gap measures is equal to 2.   Follow-Up:  Pharmacist Review

## 2020-07-04 ENCOUNTER — Other Ambulatory Visit: Payer: Self-pay | Admitting: Cardiology

## 2020-08-12 ENCOUNTER — Emergency Department (HOSPITAL_COMMUNITY): Payer: Medicare HMO

## 2020-08-12 ENCOUNTER — Ambulatory Visit: Payer: Medicare HMO | Admitting: Student

## 2020-08-12 ENCOUNTER — Inpatient Hospital Stay (HOSPITAL_COMMUNITY)
Admission: EM | Admit: 2020-08-12 | Discharge: 2020-08-13 | DRG: 247 | Disposition: A | Discharge status: Home / Self Care | Payer: Medicare HMO | Source: Ambulatory Visit | Attending: Cardiology | Admitting: Cardiology

## 2020-08-12 ENCOUNTER — Telehealth: Payer: Self-pay | Admitting: Pharmacist

## 2020-08-12 ENCOUNTER — Other Ambulatory Visit: Payer: Self-pay

## 2020-08-12 ENCOUNTER — Encounter (HOSPITAL_COMMUNITY)
Admission: EM | Disposition: A | Discharge status: Home / Self Care | Payer: Self-pay | Source: Home / Self Care | Attending: Cardiology

## 2020-08-12 ENCOUNTER — Encounter: Payer: Self-pay | Admitting: Student

## 2020-08-12 VITALS — BP 126/62 | HR 55 | Temp 97.9°F | Ht 68.0 in | Wt 238.0 lb

## 2020-08-12 DIAGNOSIS — J849 Interstitial pulmonary disease, unspecified: Secondary | ICD-10-CM | POA: Diagnosis not present

## 2020-08-12 DIAGNOSIS — Z8249 Family history of ischemic heart disease and other diseases of the circulatory system: Secondary | ICD-10-CM | POA: Diagnosis not present

## 2020-08-12 DIAGNOSIS — I249 Acute ischemic heart disease, unspecified: Secondary | ICD-10-CM

## 2020-08-12 DIAGNOSIS — Z87448 Personal history of other diseases of urinary system: Secondary | ICD-10-CM | POA: Diagnosis not present

## 2020-08-12 DIAGNOSIS — I5042 Chronic combined systolic (congestive) and diastolic (congestive) heart failure: Secondary | ICD-10-CM | POA: Diagnosis not present

## 2020-08-12 DIAGNOSIS — K219 Gastro-esophageal reflux disease without esophagitis: Secondary | ICD-10-CM | POA: Diagnosis present

## 2020-08-12 DIAGNOSIS — Z888 Allergy status to other drugs, medicaments and biological substances status: Secondary | ICD-10-CM

## 2020-08-12 DIAGNOSIS — Z79899 Other long term (current) drug therapy: Secondary | ICD-10-CM

## 2020-08-12 DIAGNOSIS — Z955 Presence of coronary angioplasty implant and graft: Secondary | ICD-10-CM | POA: Diagnosis not present

## 2020-08-12 DIAGNOSIS — R0600 Dyspnea, unspecified: Secondary | ICD-10-CM

## 2020-08-12 DIAGNOSIS — Z823 Family history of stroke: Secondary | ICD-10-CM | POA: Diagnosis not present

## 2020-08-12 DIAGNOSIS — I6529 Occlusion and stenosis of unspecified carotid artery: Secondary | ICD-10-CM | POA: Diagnosis present

## 2020-08-12 DIAGNOSIS — I25710 Atherosclerosis of autologous vein coronary artery bypass graft(s) with unstable angina pectoris: Secondary | ICD-10-CM | POA: Diagnosis present

## 2020-08-12 DIAGNOSIS — R001 Bradycardia, unspecified: Secondary | ICD-10-CM | POA: Diagnosis not present

## 2020-08-12 DIAGNOSIS — R0609 Other forms of dyspnea: Secondary | ICD-10-CM | POA: Diagnosis not present

## 2020-08-12 DIAGNOSIS — M109 Gout, unspecified: Secondary | ICD-10-CM | POA: Diagnosis present

## 2020-08-12 DIAGNOSIS — I25110 Atherosclerotic heart disease of native coronary artery with unstable angina pectoris: Secondary | ICD-10-CM

## 2020-08-12 DIAGNOSIS — I252 Old myocardial infarction: Secondary | ICD-10-CM | POA: Diagnosis not present

## 2020-08-12 DIAGNOSIS — I110 Hypertensive heart disease with heart failure: Secondary | ICD-10-CM | POA: Diagnosis not present

## 2020-08-12 DIAGNOSIS — Z20822 Contact with and (suspected) exposure to covid-19: Secondary | ICD-10-CM | POA: Diagnosis present

## 2020-08-12 DIAGNOSIS — E119 Type 2 diabetes mellitus without complications: Secondary | ICD-10-CM | POA: Diagnosis not present

## 2020-08-12 DIAGNOSIS — K449 Diaphragmatic hernia without obstruction or gangrene: Secondary | ICD-10-CM | POA: Diagnosis not present

## 2020-08-12 DIAGNOSIS — Z7984 Long term (current) use of oral hypoglycemic drugs: Secondary | ICD-10-CM

## 2020-08-12 DIAGNOSIS — I214 Non-ST elevation (NSTEMI) myocardial infarction: Secondary | ICD-10-CM | POA: Diagnosis not present

## 2020-08-12 DIAGNOSIS — Z8673 Personal history of transient ischemic attack (TIA), and cerebral infarction without residual deficits: Secondary | ICD-10-CM | POA: Diagnosis not present

## 2020-08-12 DIAGNOSIS — Z7982 Long term (current) use of aspirin: Secondary | ICD-10-CM

## 2020-08-12 DIAGNOSIS — E785 Hyperlipidemia, unspecified: Secondary | ICD-10-CM | POA: Diagnosis present

## 2020-08-12 DIAGNOSIS — Z951 Presence of aortocoronary bypass graft: Secondary | ICD-10-CM

## 2020-08-12 DIAGNOSIS — Z87891 Personal history of nicotine dependence: Secondary | ICD-10-CM | POA: Diagnosis not present

## 2020-08-12 DIAGNOSIS — R079 Chest pain, unspecified: Secondary | ICD-10-CM | POA: Diagnosis not present

## 2020-08-12 HISTORY — PX: LEFT HEART CATH AND CORS/GRAFTS ANGIOGRAPHY: CATH118250

## 2020-08-12 HISTORY — PX: CORONARY STENT INTERVENTION: CATH118234

## 2020-08-12 LAB — BASIC METABOLIC PANEL
Anion gap: 10 (ref 5–15)
BUN: 11 mg/dL (ref 8–23)
CO2: 22 mmol/L (ref 22–32)
Calcium: 9.4 mg/dL (ref 8.9–10.3)
Chloride: 105 mmol/L (ref 98–111)
Creatinine, Ser: 1.2 mg/dL (ref 0.61–1.24)
GFR, Estimated: >60 mL/min (ref >60)
Glucose, Bld: 104 mg/dL — ABNORMAL HIGH (ref 70–99)
Potassium: 4.1 mmol/L (ref 3.5–5.1)
Sodium: 137 mmol/L (ref 135–145)

## 2020-08-12 LAB — CBC WITH DIFFERENTIAL/PLATELET
Abs Immature Granulocytes: 0.03 10*3/uL (ref 0.00–0.07)
Basophils Absolute: 0.1 10*3/uL (ref 0.0–0.1)
Basophils Relative: 1 %
Eosinophils Absolute: 0.3 10*3/uL (ref 0.0–0.5)
Eosinophils Relative: 4 %
HCT: 44.7 % (ref 39.0–52.0)
Hemoglobin: 14.8 g/dL (ref 13.0–17.0)
Immature Granulocytes: 0 %
Lymphocytes Relative: 32 %
Lymphs Abs: 2.9 10*3/uL (ref 0.7–4.0)
MCH: 31.8 pg (ref 26.0–34.0)
MCHC: 33.1 g/dL (ref 30.0–36.0)
MCV: 95.9 fL (ref 80.0–100.0)
Monocytes Absolute: 0.7 10*3/uL (ref 0.1–1.0)
Monocytes Relative: 8 %
Neutro Abs: 5.1 10*3/uL (ref 1.7–7.7)
Neutrophils Relative %: 55 %
Platelets: 221 10*3/uL (ref 150–400)
RBC: 4.66 MIL/uL (ref 4.22–5.81)
RDW: 13.1 % (ref 11.5–15.5)
WBC: 9.2 10*3/uL (ref 4.0–10.5)
nRBC: 0 % (ref 0.0–0.2)

## 2020-08-12 LAB — PROTIME-INR
INR: 1 (ref 0.8–1.2)
Prothrombin Time: 12.8 seconds (ref 11.4–15.2)

## 2020-08-12 LAB — HEMOGLOBIN A1C
Hgb A1c MFr Bld: 6.4 % — ABNORMAL HIGH (ref 4.8–5.6)
Mean Plasma Glucose: 136.98 mg/dL

## 2020-08-12 LAB — TROPONIN I (HIGH SENSITIVITY)
Troponin I (High Sensitivity): 4165 ng/L (ref <18)
Troponin I (High Sensitivity): 4130 ng/L (ref <18)

## 2020-08-12 LAB — LIPID PANEL
Cholesterol: 126 mg/dL (ref 0–200)
HDL: 42 mg/dL (ref >40)
LDL Cholesterol: 40 mg/dL (ref 0–99)
Total CHOL/HDL Ratio: 3 RATIO
Triglycerides: 220 mg/dL — ABNORMAL HIGH (ref <150)
VLDL: 44 mg/dL — ABNORMAL HIGH (ref 0–40)

## 2020-08-12 LAB — POCT ACTIVATED CLOTTING TIME
Activated Clotting Time: 303 seconds
Activated Clotting Time: 327 seconds
Activated Clotting Time: 607 seconds
Activated Clotting Time: 190 seconds

## 2020-08-12 LAB — APTT: aPTT: 30 seconds (ref 24–36)

## 2020-08-12 LAB — RESP PANEL BY RT-PCR (FLU A&B, COVID) ARPGX2
Influenza A by PCR: NEGATIVE
Influenza B by PCR: NEGATIVE
SARS Coronavirus 2 by RT PCR: NEGATIVE

## 2020-08-12 LAB — HEPARIN LEVEL (UNFRACTIONATED): Heparin Unfractionated: 0.9 IU/mL — ABNORMAL HIGH (ref 0.30–0.70)

## 2020-08-12 SURGERY — LEFT HEART CATH AND CORS/GRAFTS ANGIOGRAPHY
Anesthesia: LOCAL

## 2020-08-12 MED ORDER — LIDOCAINE HCL (PF) 1 % IJ SOLN
INTRAMUSCULAR | Status: DC | PRN
  Administered 2020-08-12: 20 mL

## 2020-08-12 MED ORDER — TICAGRELOR 90 MG PO TABS
90.0000 mg | ORAL_TABLET | Freq: Two times a day (BID) | ORAL | Status: DC
  Administered 2020-08-13: 90 mg via ORAL
  Filled 2020-08-12: qty 1

## 2020-08-12 MED ORDER — NITROGLYCERIN IN D5W 200-5 MCG/ML-% IV SOLN
0.0000 ug/min | INTRAVENOUS | Status: DC
  Administered 2020-08-12: 5 ug/min via INTRAVENOUS
  Filled 2020-08-12: qty 250

## 2020-08-12 MED ORDER — HEPARIN (PORCINE) IN NACL 1000-0.9 UT/500ML-% IV SOLN
INTRAVENOUS | Status: DC | PRN
  Administered 2020-08-12 (×2): 500 mL

## 2020-08-12 MED ORDER — ASPIRIN EC 81 MG PO TBEC: 81.0000 mg | DELAYED_RELEASE_TABLET | Freq: Every day | ORAL | Status: DC

## 2020-08-12 MED ORDER — SODIUM CHLORIDE 0.9% FLUSH: 3.0000 mL | INTRAVENOUS | Status: DC | PRN

## 2020-08-12 MED ORDER — VERAPAMIL HCL 2.5 MG/ML IV SOLN
INTRAVENOUS | Status: AC
  Filled 2020-08-12: qty 2

## 2020-08-12 MED ORDER — MIDAZOLAM HCL 2 MG/2ML IJ SOLN
INTRAMUSCULAR | Status: AC
  Filled 2020-08-12: qty 2

## 2020-08-12 MED ORDER — ALLOPURINOL 100 MG PO TABS
100.0000 mg | ORAL_TABLET | Freq: Every day | ORAL | Status: DC
  Administered 2020-08-13: 100 mg via ORAL
  Filled 2020-08-12: qty 1

## 2020-08-12 MED ORDER — HEPARIN SODIUM (PORCINE) 1000 UNIT/ML IJ SOLN
INTRAMUSCULAR | Status: DC | PRN
  Administered 2020-08-12: 3000 [IU] via INTRAVENOUS
  Administered 2020-08-12: 10000 [IU] via INTRAVENOUS

## 2020-08-12 MED ORDER — LABETALOL HCL 5 MG/ML IV SOLN: 10.0000 mg | INTRAVENOUS | Status: AC | PRN

## 2020-08-12 MED ORDER — HEPARIN BOLUS VIA INFUSION
4000.0000 [IU] | Freq: Once | INTRAVENOUS | Status: AC
  Administered 2020-08-12: 4000 [IU] via INTRAVENOUS
  Filled 2020-08-12: qty 4000

## 2020-08-12 MED ORDER — ACETAMINOPHEN 325 MG PO TABS: 650.0000 mg | ORAL_TABLET | ORAL | Status: DC | PRN

## 2020-08-12 MED ORDER — SODIUM CHLORIDE 0.9 % IV SOLN: INTRAVENOUS | Status: DC

## 2020-08-12 MED ORDER — NITROGLYCERIN 0.4 MG SL SUBL: 0.4000 mg | SUBLINGUAL_TABLET | SUBLINGUAL | Status: DC | PRN

## 2020-08-12 MED ORDER — HEPARIN (PORCINE) 25000 UT/250ML-% IV SOLN
1200.0000 [IU]/h | INTRAVENOUS | Status: DC
  Administered 2020-08-12: 1200 [IU]/h via INTRAVENOUS
  Filled 2020-08-12: qty 250

## 2020-08-12 MED ORDER — SACUBITRIL-VALSARTAN 49-51 MG PO TABS
1.0000 | ORAL_TABLET | Freq: Two times a day (BID) | ORAL | Status: DC
  Administered 2020-08-12 – 2020-08-13 (×2): 1 via ORAL
  Filled 2020-08-12 (×3): qty 1

## 2020-08-12 MED ORDER — FENTANYL CITRATE (PF) 100 MCG/2ML IJ SOLN
INTRAMUSCULAR | Status: AC
  Filled 2020-08-12: qty 2

## 2020-08-12 MED ORDER — HEPARIN (PORCINE) IN NACL 1000-0.9 UT/500ML-% IV SOLN
INTRAVENOUS | Status: AC
  Filled 2020-08-12: qty 1000

## 2020-08-12 MED ORDER — SODIUM CHLORIDE 0.9% FLUSH
3.0000 mL | Freq: Two times a day (BID) | INTRAVENOUS | Status: DC
  Administered 2020-08-12 – 2020-08-13 (×2): 3 mL via INTRAVENOUS

## 2020-08-12 MED ORDER — FENTANYL CITRATE (PF) 100 MCG/2ML IJ SOLN
INTRAMUSCULAR | Status: DC | PRN
  Administered 2020-08-12 (×2): 25 ug via INTRAVENOUS

## 2020-08-12 MED ORDER — TICAGRELOR 90 MG PO TABS
ORAL_TABLET | ORAL | Status: DC | PRN
  Administered 2020-08-12: 180 mg via ORAL

## 2020-08-12 MED ORDER — SODIUM CHLORIDE 0.9 % IV SOLN: 250.0000 mL | INTRAVENOUS | Status: DC | PRN

## 2020-08-12 MED ORDER — PANTOPRAZOLE SODIUM 40 MG PO TBEC
40.0000 mg | DELAYED_RELEASE_TABLET | Freq: Every day | ORAL | Status: DC
  Administered 2020-08-13: 40 mg via ORAL
  Filled 2020-08-12: qty 1

## 2020-08-12 MED ORDER — IOHEXOL 350 MG/ML SOLN
INTRAVENOUS | Status: DC | PRN
  Administered 2020-08-12: 150 mL via INTRA_ARTERIAL

## 2020-08-12 MED ORDER — HEPARIN SODIUM (PORCINE) 1000 UNIT/ML IJ SOLN
INTRAMUSCULAR | Status: AC
  Filled 2020-08-12: qty 1

## 2020-08-12 MED ORDER — LIDOCAINE HCL (PF) 1 % IJ SOLN
INTRAMUSCULAR | Status: AC
  Filled 2020-08-12: qty 30

## 2020-08-12 MED ORDER — VERAPAMIL HCL 2.5 MG/ML IV SOLN
INTRAVENOUS | Status: DC | PRN
  Administered 2020-08-12: 100 ug via INTRACORONARY

## 2020-08-12 MED ORDER — MIDAZOLAM HCL 2 MG/2ML IJ SOLN
INTRAMUSCULAR | Status: DC | PRN
  Administered 2020-08-12: 1 mg via INTRAVENOUS

## 2020-08-12 MED ORDER — TICAGRELOR 90 MG PO TABS
ORAL_TABLET | ORAL | Status: AC
  Filled 2020-08-12: qty 2

## 2020-08-12 MED ORDER — HYDRALAZINE HCL 20 MG/ML IJ SOLN
10.0000 mg | INTRAMUSCULAR | Status: AC | PRN
Start: 2020-08-12 — End: 2020-08-13

## 2020-08-12 MED ORDER — ASPIRIN 81 MG PO CHEW
CHEWABLE_TABLET | ORAL | Status: DC | PRN
  Administered 2020-08-12: 243 mg via ORAL

## 2020-08-12 MED ORDER — ASPIRIN EC 81 MG PO TBEC
81.0000 mg | DELAYED_RELEASE_TABLET | Freq: Every day | ORAL | Status: DC
  Administered 2020-08-13: 81 mg via ORAL
  Filled 2020-08-12: qty 1

## 2020-08-12 MED ORDER — ROSUVASTATIN CALCIUM 5 MG PO TABS
40.0000 mg | ORAL_TABLET | Freq: Every day | ORAL | Status: DC
  Administered 2020-08-13: 40 mg via ORAL
  Filled 2020-08-12: qty 8

## 2020-08-12 MED ORDER — FENTANYL CITRATE (PF) 100 MCG/2ML IJ SOLN
12.5000 ug | Freq: Once | INTRAMUSCULAR | Status: AC
  Administered 2020-08-12: 12.5 ug via INTRAVENOUS
  Filled 2020-08-12: qty 2

## 2020-08-12 MED ORDER — ASPIRIN 81 MG PO CHEW
CHEWABLE_TABLET | ORAL | Status: AC
  Filled 2020-08-12: qty 3

## 2020-08-12 MED ORDER — SODIUM CHLORIDE 0.9 % IV SOLN: INTRAVENOUS | Status: AC

## 2020-08-12 MED ORDER — ONDANSETRON HCL 4 MG/2ML IJ SOLN: 4.0000 mg | Freq: Four times a day (QID) | INTRAMUSCULAR | Status: DC | PRN

## 2020-08-12 SURGICAL SUPPLY — 26 items
BALLN EUPHORA RX 2.0X6 (BALLOONS) ×2
BALLN EUPHORA RX 2.5X6 (BALLOONS) ×2
BALLN SAPPHIRE ~~LOC~~ 3.5X8 (BALLOONS) ×2 IMPLANT
BALLOON EUPHORA RX 2.0X6 (BALLOONS) ×1 IMPLANT
BALLOON EUPHORA RX 2.5X6 (BALLOONS) ×1 IMPLANT
CATH INFINITI 5 FR IM (CATHETERS) ×2 IMPLANT
CATH INFINITI 5FR ANG PIGTAIL (CATHETERS) ×2 IMPLANT
CATH INFINITI 5FR JL4 (CATHETERS) ×2 IMPLANT
CATH INFINITI JR4 5F (CATHETERS) ×2 IMPLANT
CATH LAUNCHER 6FR JR4 (CATHETERS) ×2 IMPLANT
KIT ENCORE 26 ADVANTAGE (KITS) ×2 IMPLANT
KIT HEART LEFT (KITS) ×2 IMPLANT
KIT MICROPUNCTURE NIT STIFF (SHEATH) ×2 IMPLANT
PACK CARDIAC CATHETERIZATION (CUSTOM PROCEDURE TRAY) ×2 IMPLANT
SHEATH PINNACLE 5F 10CM (SHEATH) ×4 IMPLANT
SHEATH PINNACLE 6F 10CM (SHEATH) ×2 IMPLANT
SHEATH PROBE COVER 6X72 (BAG) ×2 IMPLANT
STENT RESOLUTE ONYX 2.5X12 (Permanent Stent) ×2 IMPLANT
STENT RESOLUTE ONYX 3.5X38 (Permanent Stent) ×2 IMPLANT
TRANSDUCER W/STOPCOCK (MISCELLANEOUS) ×2 IMPLANT
TUBING CIL FLEX 10 FLL-RA (TUBING) ×2 IMPLANT
WIRE ASAHI PROWATER 180CM (WIRE) ×2 IMPLANT
WIRE COUGAR XT STRL 190CM (WIRE) ×2 IMPLANT
WIRE EMERALD 3MM-J .035X150CM (WIRE) ×2 IMPLANT
WIRE EMERALD 3MM-J .035X260CM (WIRE) ×2 IMPLANT
WIRE HI TORQ WHISPER MS 190CM (WIRE) ×2 IMPLANT

## 2020-08-12 NOTE — H&P (Addendum)
Primary Physician/Referring:  Susy Frizzle, MD  Chris [name] ID: Chris Reasons., male    DOB: Feb 26, 1941, 80 y.o.   MRN: [id number]  Chief Complaint  Chris [name] presents with  . Chest Pain   HPI:    Chris [name].  is a 80 y.o. 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    Chris [name] had a coronary angiogram on 01/2014 which demonstrated native LAD 100, LCX 100, patent grafts and also patent stents. Chris [name] underwent nuclear stress test on 04/11/2018 which had revealed large inferolateral scar with very minimal peri-infarct ischemia with EF 38% considered to be high risk, but clinically I felt it was low risk and recommended aggressive medical therapy. Echo confirmed low LVEF also.  At previous visit Chris [name] was recommended to undergo stress test as well as repeat echocardiogram, however neither have been performed yet.  Chris [name] presented to the office earlier today with urgent concerns of chest pain.  Chris [name] reports left-sided chest pain which Chris [name] describes as pressure starting last night, and has been constant since then.  Chris [name] is presently having active chest pain.  Reports pain radiates to his left shoulder, and radiated to his jaw last night briefly.  Chris [name] has chronic dyspnea, which Chris [name] feels is presently still at baseline.  Chris [name] did have an episode of emesis this morning, however Chris [name] states this is not uncommon due to mucus production and coughing.  Denies dizziness, syncope, near syncope.  Last night Chris [name] took 3 sublingual nitroglycerin tablets, without improvement of symptoms.  Chris [name] states present symptoms feel similar to previous MI symptoms.  In view of unstable  anginal symptoms during office visit today Chris [name] was advised to go to the emergency department immediately for further evaluation and management, planning for likely cardiac catheterization.  Upon presentation in the emergency department Chris [name]'s troponin is elevated at 4165.  Past Medical History:  Diagnosis Date  . Abnormal exercise myocardial perfusion study    03/2018- 38% ef, see report  . Arthritis   . Chronic kidney disease   . Congestive heart failure (CHF) ([place]) 5/17   EF 35-40% [name]  . Coronary artery disease    a. 1995 s/p CABG x 3 (VG->RCA, LIMA->LAD, VG->OM);  b. 07/2008 Inf MI/Cath/PCI: VG->RCA 99 - treated with Taxus DES (53mm), LIMA and VG->OM patent, LAD 100, LCX 100, RCA 60d, EF 50%;  c. 09/2008 PCI native RCA  w/ 2.5x23 Xience DES, VG->RCA stent patent;  c. 05/2010 Cath: Native 3VD with 3/3 patent grafts, native RCA w 80% ISR prox to graft insertion-->Med Rx.  . DOE (dyspnea on exertion)   . ED (erectile dysfunction)   . GERD (gastroesophageal reflux disease)    occ  . Gout   . H/O hiatal hernia   . Hyperlipidemia   . Hypertension   . Myocardial infarction ([place])   . Neuropathy    toes  . Obesity   . RBBB (right bundle branch block)   . Stenosis of right internal carotid artery    50-69% (2014)  . Type II diabetes mellitus ([place])   . Umbilical hernia    a. s/p repair.  . Vertigo    Past Surgical History:  Procedure Laterality Date  .  ANKLE ARTHROSCOPY WITH DRILLING/MICROFRACTURE Left 04/13/2013   Procedure: LEFT ANKLE ARTHROSCOPY WITH EXTENSIVE DEBRIEDMENT;  Surgeon: Wylene Simmer, MD;  Location: [place];  Service: Orthopedics;  Laterality: Left;  . ANKLE SURGERY Left 08/18/2013   DR HEWITT  . CARDIAC CATHETERIZATION  2000   stents x3  . CARDIAC CATHETERIZATION  02/15/2014   Procedure: LEFT HEART CATH AND CORS/GRAFTS ANGIOGRAPHY;  Surgeon: Laverda Page, MD;  Location: [facility] CATH LAB;  Service: Cardiovascular;;  . COLONOSCOPY    .  CORONARY ARTERY BYPASS GRAFT  1995   LIMA to LAD, SVG to RCA, SVG to OM.   Marland Kitchen TOTAL ANKLE ARTHROPLASTY Left 08/17/2013   Procedure: LEFT TOTAL ANKLE REPLACEMENT WITH POSSIBLE GASTROC RECESSION ;  Surgeon: Wylene Simmer, MD;  Location: [place];  Service: Orthopedics;  Laterality: Left;  . UMBILICAL HERNIA REPAIR  12/2007   Family History  Problem Relation Age of Onset  . Aneurysm Mother   . Heart disease Father   . Heart attack Father   . Stroke Brother     Social History   Tobacco Use  . Smoking status: Former Smoker    Packs/day: 1.00    Years: 9.00    Pack years: 9.00    Types: Cigarettes    Quit date: 10/31/1970    Years since quitting: 49.8  . Smokeless tobacco: Former Systems developer    Types: Chew  . Tobacco comment: chewed for 2 years  Substance Use Topics  . Alcohol use: Not Currently    Comment: occ wine last 6 months   ROS  Review of Systems  Constitutional: Negative for decreased appetite, malaise/fatigue and weight gain.  HENT: Negative.   Eyes: Negative.   Cardiovascular: Positive for chest pain and dyspnea on exertion. Negative for claudication, leg swelling, near-syncope, orthopnea, palpitations, paroxysmal nocturnal dyspnea and syncope.  Respiratory: Negative.   Endocrine: Negative.   Hematologic/Lymphatic: Does not bruise/bleed easily.  Skin: Negative.   Musculoskeletal: Negative.   Gastrointestinal: Positive for nausea and vomiting. Negative for abdominal pain and melena.  Genitourinary: Negative.   Neurological: Negative for dizziness, light-headedness, loss of balance and weakness.  Psychiatric/Behavioral: Negative.   Allergic/Immunologic: Negative.    Objective  Blood pressure 122/76, pulse (!) 59, resp. rate 17, height 5\' 8"  (1.727 m), weight 107.5 kg, SpO2 96 %.  Vitals with BMI 08/12/2020 08/12/2020 08/12/2020  Height - - -  Weight - - -  BMI - - -  Systolic 528 413 244  Diastolic 76 60 64  Pulse 59 64 61     Physical Exam Vitals and nursing note reviewed.   Constitutional:      General: Chris [name] is not in acute distress.    Appearance: Chris [name] is well-developed. Chris [name] is obese. Chris [name] is not diaphoretic.  HENT:     Head: Normocephalic and atraumatic.     Nose: Nose normal.     Mouth/Throat:     Mouth: Mucous membranes are moist.  Eyes:     Extraocular Movements: Extraocular movements intact.     Conjunctiva/sclera: Conjunctivae normal.  Neck:     Vascular: No carotid bruit or JVD.  Cardiovascular:     Rate and Rhythm: Normal rate and regular rhythm.     Pulses:          Carotid pulses are 2+ on the right side and 2+ on the left side.    Heart sounds: Normal heart sounds. No murmur heard. No gallop.      Comments: No JVD, No leg  edema.  Fem and Pop pulse difficult to feel due to body habitus.  DP  Normal and PT absent bilateral Pulmonary:     Effort: Pulmonary effort is normal. No respiratory distress.     Breath sounds: Rales (left worse than right coarse crackles) present. No wheezing.  Abdominal:     General: Bowel sounds are normal. There is no distension.     Palpations: Abdomen is soft.  Musculoskeletal:        General: Normal range of motion.     Cervical back: Normal range of motion and neck supple.     Right lower leg: No edema.     Left lower leg: No edema.  Skin:    General: Skin is warm and dry.     Capillary Refill: Capillary refill takes less than 2 seconds.     Findings: No erythema, lesion or rash.  Neurological:     General: No focal deficit present.     Mental Status: Chris [name] is alert and oriented to person, place, and time.     Cranial Nerves: No cranial nerve deficit.  Psychiatric:        Mood and Affect: Mood normal.        Behavior: Behavior normal.    Laboratory examination:   Recent Labs    05/15/20 1000 08/12/20 1253  NA 141 137  K 4.7 4.1  CL 104 105  CO2 21 22  GLUCOSE 134* 104*  BUN 13 11  CREATININE 1.24 1.20  CALCIUM 9.6 9.4  GFRNONAA 56* >60  GFRAA 64  --    estimated creatinine clearance is 61.3  mL/min (by C-G formula based on SCr of 1.2 mg/dL).  CMP Latest Ref Rng & Units 08/12/2020 05/15/2020 08/01/2019  Glucose 70 - 99 mg/dL 104(H) 134(H) 127(H)  BUN 8 - 23 mg/dL 11 13 14   Creatinine 0.61 - 1.24 mg/dL 1.20 1.24 1.44(H)  Sodium 135 - 145 mmol/L 137 141 142  Potassium 3.5 - 5.1 mmol/L 4.1 4.7 4.5  Chloride 98 - 111 mmol/L 105 104 106  CO2 22 - 32 mmol/L 22 21 27   Calcium 8.9 - 10.3 mg/dL 9.4 9.6 10.0  Total Protein 6.1 - 8.1 g/dL - - -  Total Bilirubin 0.2 - 1.2 mg/dL - - -  Alkaline Phos 40 - 115 U/L - - -  AST 10 - 35 U/L - - -  ALT 9 - 46 U/L - - -   CBC Latest Ref Rng & Units 08/12/2020 09/21/2019 07/24/2019  WBC 4.0 - 10.5 K/uL 9.2 8.8 9.3  Hemoglobin 13.0 - 17.0 g/dL 14.8 14.6 15.3  Hematocrit 39.0 - 52.0 % 44.7 44.1 44.9  Platelets 150 - 400 K/uL 221 202 268   Lipid Panel Recent Labs    05/15/20 1000 08/12/20 1253  CHOL 183 126  TRIG 344* 220*  LDLCALC 87 40  VLDL  --  44*  HDL 40 42  CHOLHDL  --  3.0    HEMOGLOBIN A1C Lab Results  Component Value Date   HGBA1C 6.4 (H) 08/12/2020   MPG 136.98 08/12/2020   TSH No results for input(s): TSH in the last 8760 hours.  Medications and allergies   Allergies  Allergen Reactions  . Brilinta [Ticagrelor] Shortness Of Breath  . Crestor [Rosuvastatin Calcium] Other (See Comments)    myalgia  . Lipitor [Atorvastatin] Other (See Comments)    Intolerable muscle pain all over     No current facility-administered medications on file prior to encounter.  Current Outpatient Medications on File Prior to Encounter  Medication Sig Dispense Refill  . allopurinol (ZYLOPRIM) 100 MG tablet TAKE 1 TABLET EVERY DAY 90 tablet 3  . aspirin EC 81 MG tablet Take 1 tablet (81 mg total) by mouth daily. (Chris [name] taking differently: Take 81 mg by mouth every other day.)    . Icosapent Ethyl (VASCEPA) 0.5 g CAPS Take 4 capsules by mouth 2 (two) times daily. (Chris [name] not taking: Reported on 08/12/2020) 720 capsule 3  . metFORMIN  (GLUCOPHAGE XR) 500 MG 24 hr tablet Take 2 tablets (1,000 mg total) by mouth daily with breakfast. 180 tablet 3  . metFORMIN (GLUCOPHAGE) 500 MG tablet TAKE 1 TABLET TWICE DAILY WITH MEALS (DISCONTINUE JARDIANCE) (Chris [name] taking differently: Take 500 mg by mouth daily with breakfast.) 180 tablet 3  . nitroGLYCERIN (NITROSTAT) 0.4 MG SL tablet Place 1 tablet (0.4 mg total) under the tongue every 5 (five) minutes x 3 doses as needed for chest pain. 25 tablet 12  . pantoprazole (PROTONIX) 40 MG tablet Take 1 tablet (40 mg total) by mouth daily. 90 tablet 3  . rosuvastatin (CRESTOR) 40 MG tablet TAKE 1 TABLET EVERY DAY 90 tablet 1  . sacubitril-valsartan (ENTRESTO) 49-51 MG Take 1 tablet by mouth 2 (two) times daily. 180 tablet 3  . triamcinolone cream (KENALOG) 0.1 % Apply 1 application topically 2 (two) times daily. 30 g 0    Radiology:   CT angio chest 07/25/2019: 1. No evidence of pulmonary embolism. 2. Scattered ground-glass density throughout both lungs with increased central and subpleural reticulation and areas of mosaic attenuation. No frank honeycombing or significant bronchiectasis. Differential considerations include chronic hypersensitivity pneumonitis or idiopathic interstitial pneumonia such as NSIP. Pulmonary consultation is suggested, as well as high-resolution chest CT follow-up in 6 months. 3. 5 mm pulmonary nodule along the right major fissure. No follow-up needed if Chris [name] is low-risk. Non-contrast chest CT can be considered in 12 months if Chris [name] is high-risk.  4.  Aortic atherosclerosis (ICD10-I70.0).  CT scan of the chest 10/04/2019:  1. 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 Findings are consistent with UIP per consensus guidelines. 2. Borderline mild cardiomegaly. 3. Moderate hiatal hernia. 4. Aortic Atherosclerosis    Cardiac Studies:   Coronary angiogram  02/15/2014:   Native LAD 100, LCX 100, patent grafts and also patent stents. SVG to RCA mid Taxus DES (42mm) patent, ( h/o Inf MI/Cath/PCI in 09/2008 PCI) native RCA w/ 2.5x23 Xience DES patent, patent LIMA to LAD and SVG to OM patent,  RCA 60% distal. Small vessel disease.   Lexiscan myoview stress test 04/11/2018: 1. Lexiscan stress test was performed. Exercise capacity was not assessed. No stress symptoms reported. Blood pressure was normal.  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  Stress EKG is non diagnostic for ischemia as it is a pharmacologic stress. 2. The overall quality of the study is good.  Left ventricular cavity is noted to be normal on the rest and stress studies.  Gated SPECT imaging demonstrates hypokinesis of the basal inferior, basal inferolateral, mid inferior, mid inferolateral, apical inferior and apical lateral myocardial wall(s).  The left ventricular ejection fraction was calculated or visually estimated to be 38%. LSPECT images reveal a large sized, medium intensity, minimally reversible perfusion defect suggestive of large infarct with minimal peri infarct ischemia in LCx/PDA territory.  3. High risk study.  Overnight oximetry test 10/11/2019  Desaturation below 88% 17.6 minutes.  Echocardiogram 08/09/2019:  Left ventricle cavity is normal in size. Moderate concentric hypertrophy of the left ventricle.  Left ventricle regional wall motion findings: Basal inferolateral, Basal inferior, Mid inferolateral and Mid inferior akinesis. Moderate decrease in global wall motion. Visual EF is 35-40%. Doppler evidence of grade II (pseudonormal) diastolic dysfunction, elevated LAP.  c.f. echo. of 03/15/2018, no significant change.   Carotid artery duplex 04/24/2020: Stenosis in the right internal carotid artery (50-69%). Stenosis in the right external  carotid artery (<50%). Stenosis in the left internal carotid artery (16-49%).  Antegrade right vertebral artery flow. Antegrade left vertebral artery flow. Follow up in six months is appropriate if clinically indicated. No significant change from 11/20/2019.  EKG  EKG 08/12/2020: Sinus bradycardia at a rate of 52 bpm.  Left atrial enlargement.  Right axis.  Right bundle branch block.  Bifascicular block.  Low voltage complexes, consider pulmonary disease pattern.  Compared to EKG 07/31/2019, no significant change.  EKG 07/31/2019: Normal sinus rhythm at the rate of 69 bpm, left atrial enlargement, right axis deviation, right bundle branch block.  Bifascicular block.  Low-voltage complexes.  Pulmonary disease pattern. No significant change from   EKG 07/13/2018    Assessment   1.  Non-ST elevation myocardial infarction  2.  Coronary artery disease with unstable angina status post CABG in 2018 3.  Hypertension 4.  Chronic combined systolic and diastolic heart failure 5.  Hyperlipidemia  Recommendations:   Stiles Maxcy.  is a 80 y.o. Caucasian male Chris [name] with hypertension, hyperlipidemia, diabetes mellitus diagnosed in 2018, CAD,hx of MI with CABG S/P multiple coronary interventions,  H/O stroke in past without residual deficits, chronic systolic and diastolic CHF with EF 90-24% and asymptomatic carotid artery stenosis who presented to the clinic in 04/2020 for evaluation of CHF and worsening dyspnea.  Notably Chris [name] is not presently on beta-blocker therapy as it was discontinued approximately 9 months ago due to marked bradycardia following Dr. Samella Parr office.  Chris [name] had previously been recommended to undergo echocardiogram and nuclear stress testing, however these have not been performed.  At last office visit Vascepa was added.    Chris [name] presented to the office earlier today with symptoms starting for unstable angina.  EKG without acute changes compared to previous.  However in view  of Chris [name]'s anginal symptoms and history of CAD as well as concern for progression of this CAD, recommended Chris [name] undergo emergent evaluation in the emergency department.    Upon presentation to the emergency department Chris [name]'s troponin was elevated > 4000.  We will initiate heparin and nitroglycerin infusion and continue home medications. Will plan to proceed with left heart catheterization.  Chris [name] was seen in collaboration with [name]. Chris [name] also reviewed Chris [name]'s chart and examined the Chris [name]. [name] is in agreement of the plan.    Alethia Berthold, PA-C 08/12/2020, 3:23 PM Office: [phone]

## 2020-08-12 NOTE — Progress Notes (Signed)
Pulled sheath at 2235, held pressure for 20 mins. Right femoral level 0, no hematoma present. Patient stable, bed rest for 4 hours. Will continue to monitor patient.

## 2020-08-12 NOTE — Progress Notes (Signed)
ANTICOAGULATION CONSULT NOTE - Initial Consult  Pharmacy Consult for heparin Indication: chest pain/ACS  Allergies  Allergen Reactions  . Brilinta [Ticagrelor] Shortness Of Breath  . Crestor [Rosuvastatin Calcium] Other (See Comments)    myalgia  . Lipitor [Atorvastatin] Other (See Comments)    Intolerable muscle pain all over     Patient Measurements:   Heparin Dosing Weight: 92.3 kg  Vital Signs: Temp: 97.9 F (36.6 C) (03/21 1127) BP: 121/77 (03/21 1309) Pulse Rate: 59 (03/21 1309)  Labs: No results for input(s): HGB, HCT, PLT, APTT, LABPROT, INR, HEPARINUNFRC, HEPRLOWMOCWT, CREATININE, CKTOTAL, CKMB, TROPONINIHS in the last 72 hours.  CrCl cannot be calculated (Patient's most recent lab result is older than the maximum 21 days allowed.).   Medical History: Past Medical History:  Diagnosis Date  . Abnormal exercise myocardial perfusion study    03/2018- 38% ef, see report  . Arthritis   . Chronic kidney disease   . Congestive heart failure (CHF) ([place]) 5/17   EF 35-40% [name]  . Coronary artery disease    a. 1995 s/p CABG x 3 (VG->RCA, LIMA->LAD, VG->OM);  b. 07/2008 Inf MI/Cath/PCI: VG->RCA 99 - treated with Taxus DES (68mm), LIMA and VG->OM patent, LAD 100, LCX 100, RCA 60d, EF 50%;  c. 09/2008 PCI native RCA  w/ 2.5x23 Xience DES, VG->RCA stent patent;  c. 05/2010 Cath: Native 3VD with 3/3 patent grafts, native RCA w 80% ISR prox to graft insertion-->Med Rx.  . DOE (dyspnea on exertion)   . ED (erectile dysfunction)   . GERD (gastroesophageal reflux disease)    occ  . Gout   . H/O hiatal hernia   . Hyperlipidemia   . Hypertension   . Myocardial infarction ([place])   . Neuropathy    toes  . Obesity   . RBBB (right bundle branch block)   . Stenosis of right internal carotid artery    50-69% (2014)  . Type II diabetes mellitus ([place])   . Umbilical hernia    a. s/p repair.  . Vertigo     Assessment: 71 YOM admitted with chest pain. No AC PTA.  Pharmacy has been consulted to dose heparin.   Goal of Therapy:  Heparin level 0.3-0.7 units/ml Monitor platelets by anticoagulation protocol: Yes   Plan:  Heparin 4000 units x1, followed by 1200 units/hr Heparin level in 8 hours Daily heparin level, CBC  Romilda Garret, PharmD PGY1 Acute Care Pharmacy Resident 08/12/2020 1:18 PM  Please check [email or web address] for unit specific pharmacy phone numbers.

## 2020-08-12 NOTE — Interval H&P Note (Signed)
History and Physical Interval Note:  08/12/2020 4:06 PM  [name]  has presented today for surgery, with the diagnosis of unstable angina.  The various methods of treatment have been discussed with the patient and family. After consideration of risks, benefits and other options for treatment, the patient has consented to  Procedure(s): LEFT HEART CATH AND CORS/GRAFTS ANGIOGRAPHY (N/A) as a surgical intervention.  The patient's history has been reviewed, patient examined, no change in status, stable for surgery.  I have reviewed the patient's chart and labs.  Questions were answered to the patient's satisfaction.    2016 Appropriate Use Criteria for Coronary Revascularization in Patients With Acute Coronary Syndrome NSTEMI/UA High Risk (TIMI Score 5-7) NSTEMI/Unstable angina, stabilized patient at high risk Link Here: sistemancia.com Indication:  Revascularization by PCI or CABG of 1 or more arteries in a patient with NSTEMI or unstable angina with Stabilization after presentation High risk for clinical events  A (7) Indication: 16; Score 7   [place]

## 2020-08-12 NOTE — Progress Notes (Addendum)
° ° °  Chronic Care Management Pharmacy Assistant   Name: Chris [name].  MRN: [id number] DOB: December 27, 1940  Reason for Encounter:General Disease State Call   Conditions to be addressed/monitored: CAD, HTN, CHF, Type II DM, HLD.  Recent office visits:  None since 06/14/20  Recent consult visits:  08/12/20 Cardiology [name], [name], PA-C.   Hospital visits:  None since 06/14/20  Medications: Outpatient Encounter Medications as of 08/12/2020  Medication Sig   allopurinol (ZYLOPRIM) 100 MG tablet TAKE 1 TABLET EVERY DAY   aspirin EC 81 MG tablet Take 1 tablet (81 mg total) by mouth daily. (Patient taking differently: Take 81 mg by mouth every other day.)   Icosapent Ethyl (VASCEPA) 0.5 g CAPS Take 4 capsules by mouth 2 (two) times daily.   metFORMIN (GLUCOPHAGE XR) 500 MG 24 hr tablet Take 2 tablets (1,000 mg total) by mouth daily with breakfast.   metFORMIN (GLUCOPHAGE) 500 MG tablet TAKE 1 TABLET TWICE DAILY WITH MEALS (DISCONTINUE JARDIANCE) (Patient taking differently: Take 500 mg by mouth daily with breakfast.)   nitroGLYCERIN (NITROSTAT) 0.4 MG SL tablet Place 1 tablet (0.4 mg total) under the tongue every 5 (five) minutes x 3 doses as needed for chest pain.   pantoprazole (PROTONIX) 40 MG tablet Take 1 tablet (40 mg total) by mouth daily.   rosuvastatin (CRESTOR) 40 MG tablet TAKE 1 TABLET EVERY DAY   sacubitril-valsartan (ENTRESTO) 49-51 MG Take 1 tablet by mouth 2 (two) times daily.   triamcinolone cream (KENALOG) 0.1 % Apply 1 application topically 2 (two) times daily.   No facility-administered encounter medications on file as of 08/12/2020.    GEN CALL:  I was unable to talk to [name] long. He informed me that he was at the emergency room for chest pain and he was getting ready to go into surgery. I was unable to get any further information from him before we ended the call.  Star Rating Drugs: Metformin 500 mg 2 tablets by mouth daily 05/23/20 90 DS , Rosuvastatin 40  mg 1 tablet daily 04/08/20 90 DS.    Follow-Up: Pharmacist Review  Charlann Lange, RMA Clinical Pharmacist Assistant [phone]  6 minutes spent in review, coordination, and documentation.  Reviewed by: Beverly Milch, PharmD Clinical Pharmacist [facility] [phone]

## 2020-08-12 NOTE — Progress Notes (Signed)
Primary Physician/Referring:  Susy Frizzle, MD  Patient ID: Chris Reasons., male    DOB: Aug 25, 1940, 80 y.o.   MRN: [id number]  Chief Complaint  Patient presents with  . Chest Pain  . Coronary Artery Disease   HPI:    Chris Mcleary.  is a 80 y.o. 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    Patient had a coronary angiogram on 01/2014 which demonstrated native LAD 100, LCX 100, patent grafts and also patent stents. He underwent nuclear stress test on 04/11/2018 which had revealed large inferolateral scar with very minimal peri-infarct ischemia with EF 38% considered to be high risk, but clinically I felt it was low risk and recommended aggressive medical therapy. Echo confirmed low LVEF also.  At previous visit patient was recommended to undergo stress test as well as repeat echocardiogram, however neither have been performed yet.  Patient presents to the office today with urgent concerns of chest pain.  Patient reports left-sided chest pain which he describes as pressure starting last night, and has been constant since then.  Patient is presently having active chest pain.  Reports pain radiates to his left shoulder, and radiated to his jaw last night briefly.  He has chronic dyspnea, which he feels is presently still at baseline.  He did have an episode of emesis this morning, however he states this is not uncommon due to mucus production and coughing.  Denies dizziness, syncope, near syncope.  Last night patient took 3 sublingual nitroglycerin tablets, without improvement of symptoms.  Patient states present symptoms feel similar to previous MI  symptoms.  Past Medical History:  Diagnosis Date  . Abnormal exercise myocardial perfusion study    03/2018- 38% ef, see report  . Arthritis   . Chronic kidney disease   . Congestive heart failure (CHF) ([place]) 5/17   EF 35-40% [name]  . Coronary artery disease    a. 1995 s/p CABG x 3 (VG->RCA, LIMA->LAD, VG->OM);  b. 07/2008 Inf MI/Cath/PCI: VG->RCA 99 - treated with Taxus DES (21mm), LIMA and VG->OM patent, LAD 100, LCX 100, RCA 60d, EF 50%;  c. 09/2008 PCI native RCA  w/ 2.5x23 Xience DES, VG->RCA stent patent;  c. 05/2010 Cath: Native 3VD with 3/3 patent grafts, native RCA w 80% ISR prox to graft insertion-->Med Rx.  . DOE (dyspnea on exertion)   . ED (erectile dysfunction)   . GERD (gastroesophageal reflux disease)    occ  . Gout   . H/O hiatal hernia   . Hyperlipidemia   . Hypertension   . Myocardial infarction ([place])   . Neuropathy    toes  . Obesity   . RBBB (right bundle branch block)   . Stenosis of right internal carotid artery    50-69% (2014)  . Type II diabetes mellitus ([place])   . Umbilical hernia    a. s/p repair.  . Vertigo    Past Surgical History:  Procedure Laterality Date  . ANKLE ARTHROSCOPY WITH DRILLING/MICROFRACTURE Left 04/13/2013   Procedure: LEFT ANKLE ARTHROSCOPY WITH EXTENSIVE DEBRIEDMENT;  Surgeon: Wylene Simmer, MD;  Location: [facility];  Service: Orthopedics;  Laterality: Left;  . ANKLE SURGERY Left 08/18/2013  DR HEWITT  . CARDIAC CATHETERIZATION  2000   stents x3  . CARDIAC CATHETERIZATION  02/15/2014   Procedure: LEFT HEART CATH AND CORS/GRAFTS ANGIOGRAPHY;  Surgeon: Laverda Page, MD;  Location: [facility] CATH LAB;  Service: Cardiovascular;;  . COLONOSCOPY    . CORONARY ARTERY BYPASS GRAFT  1995   LIMA to LAD, SVG to RCA, SVG to OM.   Marland Kitchen TOTAL ANKLE ARTHROPLASTY Left 08/17/2013   Procedure: LEFT TOTAL ANKLE REPLACEMENT WITH POSSIBLE GASTROC RECESSION ;  Surgeon: Wylene Simmer, MD;  Location: [facility];  Service: Orthopedics;   Laterality: Left;  . UMBILICAL HERNIA REPAIR  12/2007   Family History  Problem Relation Age of Onset  . Aneurysm Mother   . Heart disease Father   . Heart attack Father   . Stroke Brother     Social History   Tobacco Use  . Smoking status: Former Smoker    Packs/day: 1.00    Years: 9.00    Pack years: 9.00    Types: Cigarettes    Quit date: 10/31/1970    Years since quitting: 49.8  . Smokeless tobacco: Former Systems developer    Types: Chew  . Tobacco comment: chewed for 2 years  Substance Use Topics  . Alcohol use: Not Currently    Comment: occ wine last 6 months   ROS  Review of Systems  Constitutional: Negative for malaise/fatigue and weight gain.  Cardiovascular: Positive for chest pain and dyspnea on exertion. Negative for claudication, leg swelling, near-syncope, orthopnea, palpitations, paroxysmal nocturnal dyspnea and syncope.  Respiratory: Negative for shortness of breath.   Hematologic/Lymphatic: Does not bruise/bleed easily.  Gastrointestinal: Negative for melena.  Neurological: Negative for dizziness and weakness.   Objective  Blood pressure 126/62, pulse (!) 55, temperature 97.9 F (36.6 C), height 5\' 8"  (1.727 m), weight 238 lb (108 kg), SpO2 97 %.  Vitals with BMI 08/12/2020 08/12/2020 08/12/2020  Height 5\' 8"  - 5\' 8"   Weight 237 lbs - 238 lbs  BMI 16.10 - 96.0  Systolic - 454 098  Diastolic - 77 62  Pulse - 59 55     Physical Exam Constitutional:      General: He is not in acute distress.    Appearance: He is well-developed.  Neck:     Vascular: No JVD.  Cardiovascular:     Rate and Rhythm: Normal rate and regular rhythm.     Pulses:          Carotid pulses are 2+ on the right side and 2+ on the left side.    Heart sounds: Normal heart sounds. No murmur heard. No gallop.      Comments: No JVD, No leg edema.  Fem and Pop pulse difficult to feel due to body habitus.  DP  Normal and PT absent bilateral Pulmonary:     Effort: Pulmonary effort is normal.      Breath sounds: Rales (left worse than right coarse crackles) present.  Abdominal:     General: Bowel sounds are normal.     Palpations: Abdomen is soft.  Musculoskeletal:        General: Normal range of motion.  Skin:    General: Skin is warm and dry.  Neurological:     General: No focal deficit present.     Mental Status: He is oriented to person, place, and time.    Laboratory examination:   Recent Labs    05/15/20 1000  NA 141  K 4.7  CL 104  CO2  21  GLUCOSE 134*  BUN 13  CREATININE 1.24  CALCIUM 9.6  GFRNONAA 56*  GFRAA 64   CrCl cannot be calculated (Patient's most recent lab result is older than the maximum 21 days allowed.).  CMP Latest Ref Rng & Units 05/15/2020 08/01/2019 07/24/2019  Glucose 65 - 99 mg/dL 134(H) 127(H) 83  BUN 8 - 27 mg/dL 13 14 15   Creatinine 0.76 - 1.27 mg/dL 1.24 1.44(H) 1.22(H)  Sodium 134 - 144 mmol/L 141 142 141  Potassium 3.5 - 5.2 mmol/L 4.7 4.5 4.8  Chloride 96 - 106 mmol/L 104 106 103  CO2 20 - 29 mmol/L 21 27 27   Calcium 8.6 - 10.2 mg/dL 9.6 10.0 10.1  Total Protein 6.1 - 8.1 g/dL - - 7.9  Total Bilirubin 0.2 - 1.2 mg/dL - - 0.5  Alkaline Phos 40 - 115 U/L - - -  AST 10 - 35 U/L - - 21  ALT 9 - 46 U/L - - 15   CBC Latest Ref Rng & Units 08/12/2020 09/21/2019 07/24/2019  WBC 4.0 - 10.5 K/uL 9.2 8.8 9.3  Hemoglobin 13.0 - 17.0 g/dL 14.8 14.6 15.3  Hematocrit 39.0 - 52.0 % 44.7 44.1 44.9  Platelets 150 - 400 K/uL 221 202 268   Lipid Panel Recent Labs    05/15/20 1000  CHOL 183  TRIG 344*  LDLCALC 87  HDL 40    HEMOGLOBIN A1C Lab Results  Component Value Date   HGBA1C 7.0 (H) 05/15/2020   MPG 137 11/10/2018   TSH No results for input(s): TSH in the last 8760 hours.  Medications and allergies   Allergies  Allergen Reactions  . Brilinta [Ticagrelor] Shortness Of Breath  . Crestor [Rosuvastatin Calcium] Other (See Comments)    myalgia  . Lipitor [Atorvastatin] Other (See Comments)    Intolerable muscle pain all over      No current facility-administered medications on file prior to visit.   Current Outpatient Medications on File Prior to Visit  Medication Sig Dispense Refill  . nitroGLYCERIN (NITROSTAT) 0.4 MG SL tablet Place 1 tablet (0.4 mg total) under the tongue every 5 (five) minutes x 3 doses as needed for chest pain. 25 tablet 12  . allopurinol (ZYLOPRIM) 100 MG tablet TAKE 1 TABLET EVERY DAY 90 tablet 3  . aspirin EC 81 MG tablet Take 1 tablet (81 mg total) by mouth daily. (Patient taking differently: Take 81 mg by mouth every other day.)    . Icosapent Ethyl (VASCEPA) 0.5 g CAPS Take 4 capsules by mouth 2 (two) times daily. (Patient not taking: Reported on 08/12/2020) 720 capsule 3  . metFORMIN (GLUCOPHAGE XR) 500 MG 24 hr tablet Take 2 tablets (1,000 mg total) by mouth daily with breakfast. 180 tablet 3  . metFORMIN (GLUCOPHAGE) 500 MG tablet TAKE 1 TABLET TWICE DAILY WITH MEALS (DISCONTINUE JARDIANCE) (Patient taking differently: Take 500 mg by mouth daily with breakfast.) 180 tablet 3  . pantoprazole (PROTONIX) 40 MG tablet Take 1 tablet (40 mg total) by mouth daily. 90 tablet 3  . rosuvastatin (CRESTOR) 40 MG tablet TAKE 1 TABLET EVERY DAY 90 tablet 1  . sacubitril-valsartan (ENTRESTO) 49-51 MG Take 1 tablet by mouth 2 (two) times daily. 180 tablet 3  . triamcinolone cream (KENALOG) 0.1 % Apply 1 application topically 2 (two) times daily. 30 g 0    Radiology:   CT angio chest 07/25/2019: 1. No evidence of pulmonary embolism. 2. Scattered ground-glass density throughout both lungs with increased central and subpleural  reticulation and areas of mosaic attenuation. No frank honeycombing or significant bronchiectasis. Differential considerations include chronic hypersensitivity pneumonitis or idiopathic interstitial pneumonia such as NSIP. Pulmonary consultation is suggested, as well as high-resolution chest CT follow-up in 6 months. 3. 5 mm pulmonary nodule along the right major fissure. No  follow-up needed if patient is low-risk. Non-contrast chest CT can be considered in 12 months if patient is high-risk.  4.  Aortic atherosclerosis (ICD10-I70.0).  CT scan of the chest 10/04/2019:  1. 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 Findings are consistent with UIP per consensus guidelines. 2. Borderline mild cardiomegaly. 3. Moderate hiatal hernia. 4. Aortic Atherosclerosis    Cardiac Studies:   Coronary angiogram  02/15/2014:  Native LAD 100, LCX 100, patent grafts and also patent stents. SVG to RCA mid Taxus DES (6mm) patent, ( h/o Inf MI/Cath/PCI in 09/2008 PCI) native RCA w/ 2.5x23 Xience DES patent, patent LIMA to LAD and SVG to OM patent,  RCA 60% distal. Small vessel disease.   Lexiscan myoview stress test 04/11/2018: 1. Lexiscan stress test was performed. Exercise capacity was not assessed. No stress symptoms reported. Blood pressure was normal.  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  Stress EKG is non diagnostic for ischemia as it is a pharmacologic stress. 2. The overall quality of the study is good.  Left ventricular cavity is noted to be normal on the rest and stress studies.  Gated SPECT imaging demonstrates hypokinesis of the basal inferior, basal inferolateral, mid inferior, mid inferolateral, apical inferior and apical lateral myocardial wall(s).  The left ventricular ejection fraction was calculated or visually estimated to be 38%. LSPECT images reveal a large sized, medium intensity, minimally reversible perfusion defect suggestive of large infarct with minimal peri infarct ischemia in LCx/PDA territory.   3. High risk study.  Overnight oximetry test 10/11/2019  Desaturation below 88% 17.6 minutes.  Echocardiogram  08/09/2019:  Left ventricle cavity is normal in size. Moderate concentric hypertrophy of the left ventricle.  Left ventricle regional wall motion findings: Basal inferolateral, Basal inferior, Mid inferolateral and Mid inferior akinesis. Moderate decrease in global wall motion. Visual EF is 35-40%. Doppler evidence of grade II (pseudonormal) diastolic dysfunction, elevated LAP.  c.f. echo. of 03/15/2018, no significant change.   Carotid artery duplex 04/24/2020: Stenosis in the right internal carotid artery (50-69%). Stenosis in the right external carotid artery (<50%). Stenosis in the left internal carotid artery (16-49%).  Antegrade right vertebral artery flow. Antegrade left vertebral artery flow. Follow up in six months is appropriate if clinically indicated. No significant change from 11/20/2019.  EKG  EKG 08/12/2020: Sinus bradycardia at a rate of 52 bpm.  Left atrial enlargement.  Right axis.  Right bundle branch block.  Bifascicular block.  Low voltage complexes, consider pulmonary disease pattern.  Compared to EKG 07/31/2019, no significant change.  EKG 07/31/2019: Normal sinus rhythm at the rate of 69 bpm, left atrial enlargement, right axis deviation, right bundle branch block.  Bifascicular block.  Low-voltage complexes.  Pulmonary disease pattern. No significant change from   EKG 07/13/2018    Assessment   1. Coronary artery disease involving native coronary artery of native heart with unstable angina pectoris ([place])   2. Dyspnea on exertion   3. Chronic combined systolic and diastolic heart failure ([place])   4. Type 2 diabetes mellitus without complication,  without long-term current use of insulin ([facility])     No orders of the defined types were placed in this encounter.  There are no discontinued medications.  Orders Placed This Encounter  Procedures  . EKG 12-Lead    Recommendations:   Chris Quam.  is a 80 y.o. Caucasian male patient with hypertension, hyperlipidemia,  diabetes mellitus diagnosed in 2018, CAD,hx of MI with CABG S/P multiple coronary interventions,  H/O stroke in past without residual deficits, chronic systolic and diastolic CHF with EF 02-63% and asymptomatic carotid artery stenosis who presented to the clinic in 04/2020 for evaluation of CHF and worsening dyspnea.  Notably patient is not presently on beta-blocker therapy as it was discontinued approximately 9 months ago due to marked bradycardia following Dr. Samella Parr office.  Patient had previously been recommended to undergo echocardiogram and nuclear stress testing, however these have not been performed.  At last visit Vascepa was added.    She now presents for urgent visit with complaints of chest pain.  Patient symptoms are consistent with unstable angina.  EKG is without acute changes compared to previous.  However in view of patient's anginal symptoms and history of CAD as well as concern for progression of this CAD, recommend patient undergo emergent evaluation in the emergency department.  Patient will likely need repeat cardiac catheterization, probably tomorrow. Patient agrees to go to the emergency department for further evaluation and management. He is stable enough to transport himself there. Will plan to see him in the hospital.   Patient was seen in collaboration with [name]. He also reviewed patient's chart and examined the patient. [name] is in agreement of the plan.    Chris Berthold, PA-C 08/12/2020, 1:52 PM Office: [phone]

## 2020-08-12 NOTE — ED Triage Notes (Signed)
Pt arrives to ED states he was told to come by cardiology for cardiac cath due to abnormal EKG, pt began having chest pain last night and continues pt have today. Pt has extensive cardiac hx with 4 stents and triple bypass surgery in 1995.

## 2020-08-12 NOTE — ED Provider Notes (Signed)
[facility] EMERGENCY DEPARTMENT Provider Note   CSN: [id number] Arrival date & time: 08/12/20  1238     History Chief Complaint  Patient presents with  . Chest Pain    Chris [name]. is a 80 y.o. male.  HPI Chris [name] is a 80 year old man with a known history of coronary artery disease, status post CABG, status post multiple stents had a new onset of chest pain that began last night.  It is substernal in nature.  There is been some radiation to his left shoulder.  Pain has been worse 8 out of 10.  He took 3 nitroglycerin without relief.  He is on home aspirin.  Pain is pressure-like in nature.  He has had some baseline dyspnea.  He presented to Dr. Irven Shelling office and was told to come to the ED.    Past Medical History:  Diagnosis Date  . Abnormal exercise myocardial perfusion study    03/2018- 38% ef, see report  . Arthritis   . Chronic kidney disease   . Congestive heart failure (CHF) ([place]) 5/17   EF 35-40% [name]  . Coronary artery disease    a. 1995 s/p CABG x 3 (VG->RCA, LIMA->LAD, VG->OM);  b. 07/2008 Inf MI/Cath/PCI: VG->RCA 99 - treated with Taxus DES (64mm), LIMA and VG->OM patent, LAD 100, LCX 100, RCA 60d, EF 50%;  c. 09/2008 PCI native RCA  w/ 2.5x23 Xience DES, VG->RCA stent patent;  c. 05/2010 Cath: Native 3VD with 3/3 patent grafts, native RCA w 80% ISR prox to graft insertion-->Med Rx.  . DOE (dyspnea on exertion)   . ED (erectile dysfunction)   . GERD (gastroesophageal reflux disease)    occ  . Gout   . H/O hiatal hernia   . Hyperlipidemia   . Hypertension   . Myocardial infarction ([place])   . Neuropathy    toes  . Obesity   . RBBB (right bundle branch block)   . Stenosis of right internal carotid artery    50-69% (2014)  . Type II diabetes mellitus ([place])   . Umbilical hernia    a. s/p repair.  . Vertigo     Patient Active Problem List   Diagnosis Date Noted  . Abnormal exercise myocardial perfusion study   . Congestive  heart failure (CHF) ([place])   . NSTEMI (non-ST elevated myocardial infarction) ([place]) 02/15/2014  . Type II diabetes mellitus ([place])   . Arthritis of left ankle 08/17/2013  . Stenosis of right internal carotid artery   . Precordial pain 06/03/2012  . TIA (transient ischemic attack) 10/02/2011  . Peripheral neuropathy 04/14/2011  . CAD (coronary artery disease) 11/04/2010  . Hypertension 11/04/2010  . Renal insufficiency 11/04/2010  . Obese 11/04/2010  . CVA (cerebral vascular accident) ([place]) 11/04/2010  . Hyperlipemia 11/04/2010    Past Surgical History:  Procedure Laterality Date  . ANKLE ARTHROSCOPY WITH DRILLING/MICROFRACTURE Left 04/13/2013   Procedure: LEFT ANKLE ARTHROSCOPY WITH EXTENSIVE DEBRIEDMENT;  Surgeon: Wylene Simmer, MD;  Location: [place];  Service: Orthopedics;  Laterality: Left;  . ANKLE SURGERY Left 08/18/2013   DR HEWITT  . CARDIAC CATHETERIZATION  2000   stents x3  . CARDIAC CATHETERIZATION  02/15/2014   Procedure: LEFT HEART CATH AND CORS/GRAFTS ANGIOGRAPHY;  Surgeon: Laverda Page, MD;  Location: [facility] CATH LAB;  Service: Cardiovascular;;  . COLONOSCOPY    . CORONARY ARTERY BYPASS GRAFT  1995   LIMA to LAD, SVG to RCA, SVG to OM.   Marland Kitchen  TOTAL ANKLE ARTHROPLASTY Left 08/17/2013   Procedure: LEFT TOTAL ANKLE REPLACEMENT WITH POSSIBLE GASTROC RECESSION ;  Surgeon: Wylene Simmer, MD;  Location: [place];  Service: Orthopedics;  Laterality: Left;  . UMBILICAL HERNIA REPAIR  12/2007       Family History  Problem Relation Age of Onset  . Aneurysm Mother   . Heart disease Father   . Heart attack Father   . Stroke Brother     Social History   Tobacco Use  . Smoking status: Former Smoker    Packs/day: 1.00    Years: 9.00    Pack years: 9.00    Types: Cigarettes    Quit date: 10/31/1970    Years since quitting: 49.8  . Smokeless tobacco: Former Systems developer    Types: Chew  . Tobacco comment: chewed for 2 years  Vaping Use  . Vaping Use: Never used   Substance Use Topics  . Alcohol use: Not Currently    Comment: occ wine last 6 months  . Drug use: No    Home Medications Prior to Admission medications   Medication Sig Start Date End Date Taking? Authorizing Provider  allopurinol (ZYLOPRIM) 100 MG tablet TAKE 1 TABLET EVERY DAY 11/30/19   Ishmael Holter A, FNP  aspirin EC 81 MG tablet Take 1 tablet (81 mg total) by mouth daily. Patient taking differently: Take 81 mg by mouth every other day. 02/16/14   Adrian Prows, MD  Icosapent Ethyl (VASCEPA) 0.5 g CAPS Take 4 capsules by mouth 2 (two) times daily. Patient not taking: Reported on 08/12/2020 05/26/20   Adrian Prows, MD  metFORMIN (GLUCOPHAGE XR) 500 MG 24 hr tablet Take 2 tablets (1,000 mg total) by mouth daily with breakfast. 05/23/20   Susy Frizzle, MD  metFORMIN (GLUCOPHAGE) 500 MG tablet TAKE 1 TABLET TWICE DAILY WITH MEALS (DISCONTINUE JARDIANCE) Patient taking differently: Take 500 mg by mouth daily with breakfast. 01/31/20   Susy Frizzle, MD  nitroGLYCERIN (NITROSTAT) 0.4 MG SL tablet Place 1 tablet (0.4 mg total) under the tongue every 5 (five) minutes x 3 doses as needed for chest pain. 01/18/19   Susy Frizzle, MD  pantoprazole (PROTONIX) 40 MG tablet Take 1 tablet (40 mg total) by mouth daily. 01/09/19   Susy Frizzle, MD  rosuvastatin (CRESTOR) 40 MG tablet TAKE 1 TABLET EVERY DAY 07/05/20   Adrian Prows, MD  sacubitril-valsartan (ENTRESTO) 49-51 MG Take 1 tablet by mouth 2 (two) times daily. 08/16/19   Susy Frizzle, MD  triamcinolone cream (KENALOG) 0.1 % Apply 1 application topically 2 (two) times daily. 11/11/18   Susy Frizzle, MD    Allergies    Brilinta [ticagrelor], Crestor [rosuvastatin calcium], and Lipitor [atorvastatin]  Review of Systems   Review of Systems  All other systems reviewed and are negative.   Physical Exam Updated Vital Signs BP 121/77   Pulse (!) 59   Resp 14   SpO2 97%   Physical Exam Vitals and nursing note reviewed.   Constitutional:      Appearance: He is well-developed.  HENT:     Head: Normocephalic and atraumatic.     Right Ear: External ear normal.     Left Ear: External ear normal.     Nose: Nose normal.  Eyes:     Conjunctiva/sclera: Conjunctivae normal.     Pupils: Pupils are equal, round, and reactive to light.  Cardiovascular:     Rate and Rhythm: Normal rate and regular rhythm.  Heart sounds: Normal heart sounds.  Pulmonary:     Effort: Pulmonary effort is normal. No respiratory distress.     Breath sounds: Normal breath sounds. No wheezing.  Chest:     Chest wall: No tenderness.  Abdominal:     General: Bowel sounds are normal. There is no distension.     Palpations: Abdomen is soft. There is no mass.     Tenderness: There is no abdominal tenderness. There is no guarding.  Musculoskeletal:        General: Normal range of motion.     Cervical back: Normal range of motion and neck supple.  Skin:    General: Skin is warm and dry.  Neurological:     Mental Status: He is alert and oriented to person, place, and time.     Motor: No abnormal muscle tone.     Coordination: Coordination normal.     Deep Tendon Reflexes: Reflexes are normal and symmetric.  Psychiatric:        Behavior: Behavior normal.        Thought Content: Thought content normal.        Judgment: Judgment normal.     ED Results / Procedures / Treatments   Labs (all labs ordered are listed, but only abnormal results are displayed) Labs Reviewed  RESP PANEL BY RT-PCR (FLU A&B, COVID) ARPGX2  BASIC METABOLIC PANEL  CBC  HEMOGLOBIN A1C  CBC WITH DIFFERENTIAL/PLATELET  PROTIME-INR  APTT  LIPID PANEL  TROPONIN I (HIGH SENSITIVITY)    EKG EKG Interpretation  Date/Time:  Monday August 12 2020 12:42:39 EDT Ventricular Rate:  56 PR Interval:  164 QRS Duration: 132 QT Interval:  468 QTC Calculation: 451 R Axis:   120 Text Interpretation: Sinus bradycardia with sinus arrhythmia Right bundle branch block  Lateral infarct , age undetermined Possible Inferior infarct , age undetermined Abnormal ECG Confirmed by Pattricia Boss [phone]) on 08/12/2020 1:09:47 PM   Radiology No results found.  Procedures .Critical Care Performed by: Pattricia Boss, MD Authorized by: Pattricia Boss, MD   Critical care provider statement:    Critical care time (minutes):  45   Critical care was necessary to treat or prevent imminent or life-threatening deterioration of the following conditions:  Cardiac failure   Critical care was time spent personally by me on the following activities:  Discussions with consultants, evaluation of patient's response to treatment, examination of patient, ordering and performing treatments and interventions, ordering and review of laboratory studies, ordering and review of radiographic studies, pulse oximetry, re-evaluation of patient's condition, obtaining history from patient or surrogate and review of old charts     Medications Ordered in ED Medications  0.9 %  sodium chloride infusion (has no administration in time range)  heparin bolus via infusion 4,000 Units (has no administration in time range)  heparin ADULT infusion 100 units/mL (25000 units/281mL) (has no administration in time range)    ED Course  I have reviewed the triage vital signs and the nursing notes.  Pertinent labs & imaging results that were available during my care of the patient were reviewed by me and considered in my medical decision making (see chart for details).    MDM Rules/Calculators/A&P                          80 year old man with known coronary artery disease presents today with new onset of chest pain. Care discussed with [name].  He plans cath today.  Heparin was started here in ED.  Nitroglycerin will be started. Labs and chest x-[place] [facility] pending at this time. Patient with troponin back greater than 4000 Patient remains hemodynamically stable pain has decreased somewhat with initiation  nitroglycerin Final Clinical Impression(s) / ED Diagnoses Final diagnoses:  ACS (acute coronary syndrome) ([place])  NSTEMI (non-ST elevated myocardial infarction) [facility])    Rx / DC Orders ED Discharge Orders    None       Pattricia Boss, MD 08/12/20 1428

## 2020-08-13 ENCOUNTER — Other Ambulatory Visit (HOSPITAL_COMMUNITY): Payer: Self-pay | Admitting: Cardiology

## 2020-08-13 ENCOUNTER — Inpatient Hospital Stay (HOSPITAL_COMMUNITY): Payer: Medicare HMO

## 2020-08-13 ENCOUNTER — Encounter (HOSPITAL_COMMUNITY): Payer: Self-pay | Admitting: Cardiology

## 2020-08-13 LAB — CBC
HCT: 40.1 % (ref 39.0–52.0)
Hemoglobin: 13.5 g/dL (ref 13.0–17.0)
MCH: 31.7 pg (ref 26.0–34.0)
MCHC: 33.7 g/dL (ref 30.0–36.0)
MCV: 94.1 fL (ref 80.0–100.0)
Platelets: 189 10*3/uL (ref 150–400)
RBC: 4.26 MIL/uL (ref 4.22–5.81)
RDW: 12.9 % (ref 11.5–15.5)
WBC: 8.7 10*3/uL (ref 4.0–10.5)
nRBC: 0 % (ref 0.0–0.2)

## 2020-08-13 LAB — LIPID PANEL
Cholesterol: 110 mg/dL (ref 0–200)
HDL: 35 mg/dL — ABNORMAL LOW (ref >40)
LDL Cholesterol: 29 mg/dL (ref 0–99)
Total CHOL/HDL Ratio: 3.1 RATIO
Triglycerides: 230 mg/dL — ABNORMAL HIGH (ref <150)
VLDL: 46 mg/dL — ABNORMAL HIGH (ref 0–40)

## 2020-08-13 LAB — BASIC METABOLIC PANEL
Anion gap: 7 (ref 5–15)
BUN: 13 mg/dL (ref 8–23)
CO2: 26 mmol/L (ref 22–32)
Calcium: 9 mg/dL (ref 8.9–10.3)
Chloride: 104 mmol/L (ref 98–111)
Creatinine, Ser: 1.34 mg/dL — ABNORMAL HIGH (ref 0.61–1.24)
GFR, Estimated: 55 mL/min — ABNORMAL LOW (ref >60)
Glucose, Bld: 121 mg/dL — ABNORMAL HIGH (ref 70–99)
Potassium: 4.1 mmol/L (ref 3.5–5.1)
Sodium: 137 mmol/L (ref 135–145)

## 2020-08-13 SURGERY — LEFT HEART CATH AND CORS/GRAFTS ANGIOGRAPHY
Anesthesia: LOCAL

## 2020-08-13 MED ORDER — CLOPIDOGREL BISULFATE 75 MG PO TABS: 75.0000 mg | ORAL_TABLET | Freq: Every day | ORAL | 1 refills | Status: DC

## 2020-08-13 MED ORDER — CLOPIDOGREL BISULFATE 75 MG PO TABS: 600.0000 mg | ORAL_TABLET | Freq: Once | ORAL | Status: DC

## 2020-08-13 MED ORDER — CLOPIDOGREL BISULFATE 75 MG PO TABS
75.0000 mg | ORAL_TABLET | Freq: Every day | ORAL | Status: DC
  Filled 2020-08-13: qty 1

## 2020-08-13 MED ORDER — CLOPIDOGREL BISULFATE 300 MG PO TABS: 600.0000 mg | ORAL_TABLET | Freq: Once | ORAL | 0 refills | Status: AC

## 2020-08-13 MED ORDER — METFORMIN HCL ER 500 MG PO TB24: ORAL_TABLET | ORAL | 0 refills | Status: DC

## 2020-08-13 MED ORDER — CLOPIDOGREL BISULFATE 75 MG PO TABS: 75.0000 mg | ORAL_TABLET | Freq: Every day | ORAL | Status: DC

## 2020-08-13 MED FILL — CLOPIDOGREL 75 MG TABLET: 75 | 30 days supply | Qty: 38 | Fill #0

## 2020-08-13 NOTE — Progress Notes (Signed)
CARDIAC REHAB PHASE I   PRE:  Rate/Rhythm: 59 SB lying     BP: lying 110/63, standing 110/70    SaO2: 95 RA  MODE:  Ambulation: 150 ft   POST:  Rate/Rhythm: 99 SR    BP: sitting 119/84     SaO2: 96 RA  Pt dizzy and weak standing. BP stable. Small steps, leaning somewhat heavy on RW walking. Sts he feels generally weak. He sts he normally walks to his barn without RW. He does have RW at home if he needs it. To recliner, VSS.   Discussed MI, stents, Brilinta, restrictions, diet, walking as tolerated, NTG and need for CPRII. Pt and wife would also potentially benefit from pulmonary rehab therefore gave him brochure. 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  [place], ACSM 08/13/2020 10:23 AM

## 2020-08-13 NOTE — Progress Notes (Signed)
  Echocardiogram 2D Echocardiogram has been performed.  Chris [name] 08/13/2020, 3:16 PM

## 2020-08-14 ENCOUNTER — Telehealth (HOSPITAL_COMMUNITY): Payer: Self-pay

## 2020-08-14 ENCOUNTER — Other Ambulatory Visit: Payer: Self-pay

## 2020-08-14 LAB — ECHOCARDIOGRAM COMPLETE
Area-P 1/2: 3.6 cm2
Height: 68 in
S' Lateral: 2.7 cm
Weight: 3700.8 oz

## 2020-08-14 LAB — POCT ACTIVATED CLOTTING TIME: Activated Clotting Time: 160 seconds

## 2020-08-14 MED ORDER — NITROGLYCERIN 0.4 MG SL SUBL: 0.4000 mg | SUBLINGUAL_TABLET | SUBLINGUAL | 12 refills | Status: DC | PRN

## 2020-08-14 NOTE — Discharge Summary (Addendum)
Physician Discharge Summary  Patient ID: Chris [name]. MRN: [id number] DOB/AGE: 78-Jan-1942 78 y.o.  Admit date: 08/12/2020 Discharge date: 08/14/2020  Primary Discharge Diagnosis: NSTEMI  Secondary Discharge Diagnosis: Hypertension Hyperlipidemia Type II DM CAD s/p CABG HFrEF   Hospital Course:   80 y.o. Caucasian male with hypertension, hyperlipidemia, type 2 DM, CAD s/p CABG (LIMA-LAD, SVG-RCA, SVG-OM1) in 1995, prior MI's and PCI to native RCA and SVG-RCA, h/o stroke without residual deficits, HFrEF EF 35-40%, asymptomatic carotid artery stenosis, admitted with NSTEMI  Patient underwent successful bypass graft intervention, details below.  He had history of shortness of breath with Brilinta, which is noted to allergy.  Given the complexity of his disease and presentation, I attempted use of Brilinta.  However, he did have shortness of breath, which could be multifactorial due to his underlying interstitial lung disease as well as Brilinta use.  Chest x-ray did not show any significant vascular congestion.  Thus, Brilinta was stopped and switched to Plavix.  On the day of discharge, patient ambulated with no significant chest pain or dyspnea.  He was discharged on dual antiplatelet therapy with aspirin and Plavix.  Transition care follow up was arranged.  Did the patient have an acute coronary syndrome (MI, NSTEMI, STEMI, etc) this admission?: Yes                               AHA/ACC Clinical Performance & Quality Measures: 1. Aspirin prescribed? - Yes 2. ADP Receptor Inhibitor (Plavix/Clopidogrel, Brilinta/Ticagrelor or Effient/Prasugrel) prescribed (includes medically managed patients)? - Yes 3. Beta Blocker prescribed? - Yes 4. High Intensity Statin (Lipitor 40-80mg  or Crestor 20-40mg ) prescribed? - Yes 5. EF assessed during THIS hospitalization? - Yes 6. For EF <40%, was ACEI/ARB prescribed? - Not Applicable (EF >/= 08%) 7. For EF <40%, Aldosterone Antagonist  (Spironolactone or Eplerenone) prescribed? - Not Applicable (EF >/= 14%) 8. Cardiac Rehab Phase II ordered (including medically managed patients)? - Yes    Discharge Exam: Blood pressure 131/78, pulse 75, temperature 98.4 F (36.9 C), temperature source Oral, resp. rate 15, height 5\' 8"  (1.727 m), weight 104.9 kg, SpO2 97 %.   Physical Exam Vitals and nursing note reviewed.  Constitutional:      General: He is not in acute distress.    Appearance: He is well-developed.  HENT:     Head: Normocephalic and atraumatic.  Eyes:     Conjunctiva/sclera: Conjunctivae normal.     Pupils: Pupils are equal, round, and reactive to light.  Neck:     Vascular: No JVD.  Cardiovascular:     Rate and Rhythm: Normal rate and regular rhythm.     Pulses: Normal pulses and intact distal pulses.     Heart sounds: No murmur heard.   Pulmonary:     Effort: Pulmonary effort is normal.     Breath sounds: Normal breath sounds. No wheezing or rales.  Abdominal:     General: Bowel sounds are normal.     Palpations: Abdomen is soft.     Tenderness: There is no rebound.  Musculoskeletal:        General: No tenderness. Normal range of motion.     Left lower leg: No edema.  Lymphadenopathy:     Cervical: No cervical adenopathy.  Skin:    General: Skin is warm and dry.  Neurological:     Mental Status: He is alert and oriented to person, place, and time.  Cranial Nerves: No cranial nerve deficit.       Significant Diagnostic Studies:  EKG 08/13/2020: Sinus rhythm 84 bpm.  Right bundle branch block.  Inferolateral infarct, age indeterminate.  Echocardiogram 08/13/2020: 1. Left ventricular ejection fraction, by estimation, is 45 to 50%. The  left ventricle has mildly decreased function. The left ventricle  demonstrates regional wall motion abnormalities (see scoring  diagram/findings for description). There is mild left  ventricular hypertrophy. Left ventricular diastolic parameters are   consistent with Grade I diastolic dysfunction (impaired relaxation). There  is mild hypokinesis of the left ventricular, basal inferior wall.  2. Right ventricular systolic function is normal. The right ventricular  size is normal.  3. No significant valvular abnormalities.  4. Normal right atrial pressure.  5. Compared to previous outpatient study on 08/09/2019, LVEF has improved  from 35-40%.   Coronary intervention 08/12/2020: LM: Normal LAD: 100% occlusion after D1 LCx: 100% prox occlusion RCA: 100% mid occlusion with ISR LIMA-LAD: Patent SVG-OM3: Patent SVG-RCA: Patent prox-mid stent with 40% lumen loss                90% and 80% lesions in distal part of SVG-RCA                90% lesion at SVG-to RCA anastomosis  LVEF 40-45%. LVEDP 18 mmHg  Successful percutaneous coronary intervention SVG-RCA and SVG to RCA anastamosis Direct stent placement 3.5 X 38 mm Resolute Onyx drug-eluting stent SVG-RCA w/0% residual stenosis, TIMI III flow PTCA and stent placement 2.5 X 12 mm Resolute Onyx drug-eluting stent SVG to RCA anastomosis w/0% residual stenosis, TIMI III flow   Labs:   Lab Results  Component Value Date   WBC 8.7 08/13/2020   HGB 13.5 08/13/2020   HCT 40.1 08/13/2020   MCV 94.1 08/13/2020   PLT 189 08/13/2020    Recent Labs  Lab 08/13/20 0306  NA 137  K 4.1  CL 104  CO2 26  BUN 13  CREATININE 1.34*  CALCIUM 9.0  GLUCOSE 121*    Lipid Panel     Component Value Date/Time   CHOL 110 08/13/2020 0306   CHOL 183 05/15/2020 1000   TRIG 230 (H) 08/13/2020 0306   HDL 35 (L) 08/13/2020 0306   HDL 40 05/15/2020 1000   CHOLHDL 3.1 08/13/2020 0306   VLDL 46 (H) 08/13/2020 0306   LDLCALC 29 08/13/2020 0306   LDLCALC 87 05/15/2020 1000   LDLCALC 69 08/01/2019 0835     HEMOGLOBIN A1C Lab Results  Component Value Date   HGBA1C 6.4 (H) 08/12/2020   MPG 136.98 08/12/2020    Cardiac Panel (last 3 results) Results for Chris [name], Chris [name] (MRN  [id number]) as of 08/14/2020 09:29  Ref. Range 08/12/2020 12:53 08/12/2020 14:20  Troponin I (High Sensitivity) Latest Ref Range: <18 ng/L 4,165 (HH) 4,130 (HH)    Lab Results  Component Value Date   CKTOTAL 422 (H) 08/20/2008   CKMB 28.9 (H) 08/20/2008   TROPONINI <0.30 02/16/2014    Radiology: CARDIAC CATHETERIZATION  Result Date: 08/12/2020 LM: Normal LAD: 100% occlusion after D1 LCx: 100% prox occlusion RCA: 100% mid occlusion with ISR LIMA-LAD: Patent SVG-OM3: Patent SVG-RCA: Patent prox-mid stent with 40% lumen loss                90% and 80% lesions in distal part of SVG-RCA                90% lesion at SVG-to RCA anastomosis  LVEF 40-45%. LVEDP 18 mmHg Successful percutaneous coronary intervention SVG-RCA and SVG to RCA anastamosis Direct stent placement 3.5 X 38 mm Resolute Onyx drug-eluting stent SVG-RCA w/0% residual stenosis, TIMI III flow PTCA and stent placement 2.5 X 12 mm Resolute Onyx drug-eluting stent SVG to RCA anastomosis w/0% residual stenosis, TIMI III flow Nigel Mormon, MD Pager: [phone] Office: [phone]   DG Chest Port 1 View  Result Date: 08/12/2020 CLINICAL DATA:  Chest pain EXAM: PORTABLE CHEST 1 VIEW COMPARISON:  07/24/2019 FINDINGS: The cardio pericardial silhouette is enlarged. Moderate hiatal hernia noted. Interstitial markings are diffusely coarsened with chronic features. The visualized bony structures of the thorax show no acute abnormality. Telemetry leads overlie the chest. IMPRESSION: 1. Enlargement of the cardiopericardial silhouette. 2. Moderate hiatal hernia. 3. Chronic interstitial lung disease. Electronically Signed   By: Misty Stanley M.D.   On: 08/12/2020 13:51       FOLLOW UP PLANS AND APPOINTMENTS Discharge Instructions    Amb Referral to Cardiac Rehabilitation   Complete by: As directed    Diagnosis:  Coronary Stents NSTEMI PTCA     After initial evaluation and assessments completed: Virtual Based Care may be provided alone or  in conjunction with Phase 2 Cardiac Rehab based on patient barriers.: Yes   Diet - low sodium heart healthy   Complete by: As directed    Increase activity slowly   Complete by: As directed      Allergies as of 08/13/2020      Reactions   Brilinta [ticagrelor] Shortness Of Breath   Crestor [rosuvastatin Calcium] Other (See Comments)   Muscle pain- tolerating this 2022, however   Lipitor [atorvastatin] Other (See Comments)   Intolerable muscle pain all over       Medication List    TAKE these medications   allopurinol 100 MG tablet Commonly known as: ZYLOPRIM TAKE 1 TABLET EVERY DAY   aspirin EC 81 MG tablet Take 1 tablet (81 mg total) by mouth daily.   clopidogrel 75 MG tablet Commonly known as: PLAVIX Take 1 tablet (75 mg total) by mouth daily.   Entresto 49-51 MG Generic drug: sacubitril-valsartan Take 1 tablet by mouth 2 (two) times daily.   Fish Oil 1000 MG Caps Take 1,000 mg by mouth daily.   metFORMIN 500 MG 24 hr tablet Commonly known as: Glucophage XR Hold on hospital discharge (08/13/2020) Resume on 08/15/2020 What changed:   how much to take  how to take this  when to take this  additional instructions  Another medication with the same name was removed. Continue taking this medication, and follow the directions you see here.   nitroGLYCERIN 0.4 MG SL tablet Commonly known as: NITROSTAT Place 1 tablet (0.4 mg total) under the tongue every 5 (five) minutes x 3 doses as needed for chest pain.   pantoprazole 40 MG tablet Commonly known as: PROTONIX Take 1 tablet (40 mg total) by mouth daily.   pseudoephedrine 30 MG tablet Commonly known as: SUDAFED Take 30 mg by mouth daily as needed for congestion.   rosuvastatin 40 MG tablet Commonly known as: CRESTOR TAKE 1 TABLET EVERY DAY   triamcinolone 0.1 % Commonly known as: KENALOG Apply 1 application topically 2 (two) times daily. What changed:   when to take this  [name] to take this    Vascepa 0.5 g Caps Generic drug: Icosapent Ethyl Take 4 capsules by mouth 2 (two) times daily. What changed: how much to take     ASK  your doctor about these medications   clopidogrel 300 MG Tabs tablet Commonly known as: PLAVIX Take 2 tablets (600 mg total) by mouth once for 1 dose. Ask about: Should I take this medication?       Follow-up Information    Adrian Prows, MD. Schedule an appointment as soon as possible for a visit in 105 day(s).   Specialty: Cardiology Contact information: [place] [address] [phone]                 Nigel Mormon, MD Pager: [phone] Office: [phone]

## 2020-08-14 NOTE — Telephone Encounter (Signed)
Pt insurance is active and benefits verified through [facility]. Co-pay $10.00, DED $0.00/$0.00 met, out of pocket $3,900.00/$0.00 met, co-insurance 0%. No pre-authorization required. Passport, 08/14/20 @ 1:40PM, APO#[id number]  Will contact patient to see if he is interested in the Cardiac Rehab Program. If interested, patient will need to complete follow up appt. Once completed, patient will be contacted for scheduling upon review by the RN Navigator.

## 2020-08-14 NOTE — Telephone Encounter (Signed)
Called patient to see if he is interested in the Cardiac Rehab Program. Patient expressed interest. Explained scheduling process and went over insurance, patient verbalized understanding. Will contact patient for scheduling once f/u has been completed.  °

## 2020-08-15 ENCOUNTER — Other Ambulatory Visit: Payer: Self-pay | Admitting: Family Medicine

## 2020-08-15 ENCOUNTER — Telehealth: Payer: Self-pay

## 2020-08-15 NOTE — Telephone Encounter (Signed)
-----   Message from [facility], MD sent at 08/13/2020  5:24 PM EDT ----- Regarding: F/u and TOC Discharge follow up: TOC: Needed Follow up appt: Needed. W/JG/MP/CC in that order in 1-2 weeks for hospital f/u Discharge diagnosis: NSTEMI Discharge date: 08/13/2020  Thanks MJP

## 2020-08-15 NOTE — Telephone Encounter (Signed)
Location of hospitalization: [facility] Reason for hospitalization: Chest pain Date of discharge: 08/13/2020 Date of first communication with patient: today Person contacting patient: Gaye Alken, CMA Current symptoms: weak, not much appetite Do you understand why you were in the Hospital: Yes Questions regarding discharge instructions: None Where were you discharged to: Home Medications reviewed: Yes Allergies reviewed: Yes Dietary changes reviewed: Yes. Discussed low fat and low salt diet.  Referals reviewed: NA Activities of Daily Living: Able to with mild limitations Any transportation issues/concerns: None Any patient concerns: None Confirmed importance & date/time of Follow up appt: Yes Confirmed with patient if condition begins to worsen call. Pt was given the office number and encouraged to call back with questions or concerns: Yes

## 2020-08-22 ENCOUNTER — Other Ambulatory Visit: Payer: Medicare HMO

## 2020-08-27 ENCOUNTER — Telehealth: Payer: Self-pay | Admitting: Family Medicine

## 2020-08-27 ENCOUNTER — Ambulatory Visit: Payer: Medicare HMO | Admitting: Cardiology

## 2020-08-27 DIAGNOSIS — L219 Seborrheic dermatitis, unspecified: Secondary | ICD-10-CM

## 2020-08-27 MED ORDER — TRIAMCINOLONE ACETONIDE 0.1 % EX CREA: 1.0000 "application " | TOPICAL_CREAM | Freq: Two times a day (BID) | CUTANEOUS | 3 refills | Status: DC | PRN

## 2020-08-27 NOTE — Telephone Encounter (Signed)
Prescription sent to pharmacy for Triamcinolone.  Call placed to patient. Chris [name].

## 2020-08-27 NOTE — Telephone Encounter (Signed)
Patient called to request med refill for   triamcinolone cream (KENALOG) 0.1 % [[id number]]   Pharmacy:  [id number] [name], [place]  [address] [place], [place] [address]  Phone:  [phone] Fax:  [phone]  DEA #:  [id number]   Patient also wants another med refilled but wants to speak with nurse first.    Please advise at [phone]

## 2020-08-30 ENCOUNTER — Ambulatory Visit (INDEPENDENT_AMBULATORY_CARE_PROVIDER_SITE_OTHER): Payer: Medicare HMO | Admitting: Family Medicine

## 2020-08-30 ENCOUNTER — Encounter: Payer: Self-pay | Admitting: Family Medicine

## 2020-08-30 ENCOUNTER — Other Ambulatory Visit: Payer: Self-pay

## 2020-08-30 VITALS — BP 128/64 | HR 66 | Temp 97.9°F | Resp 16 | Ht 68.0 in | Wt 234.0 lb

## 2020-08-30 DIAGNOSIS — E782 Mixed hyperlipidemia: Secondary | ICD-10-CM

## 2020-08-30 DIAGNOSIS — J849 Interstitial pulmonary disease, unspecified: Secondary | ICD-10-CM

## 2020-08-30 DIAGNOSIS — I2510 Atherosclerotic heart disease of native coronary artery without angina pectoris: Secondary | ICD-10-CM | POA: Diagnosis not present

## 2020-08-30 DIAGNOSIS — I25110 Atherosclerotic heart disease of native coronary artery with unstable angina pectoris: Secondary | ICD-10-CM

## 2020-08-30 DIAGNOSIS — I10 Essential (primary) hypertension: Secondary | ICD-10-CM | POA: Diagnosis not present

## 2020-08-30 DIAGNOSIS — I509 Heart failure, unspecified: Secondary | ICD-10-CM | POA: Diagnosis not present

## 2020-08-30 DIAGNOSIS — J8410 Pulmonary fibrosis, unspecified: Secondary | ICD-10-CM

## 2020-08-30 NOTE — Progress Notes (Signed)
Subjective:    Patient ID: Chris Reasons., male    DOB: [birth year], 80 y.o.   MRN: [id number]  Admit date: 08/12/2020 Discharge date: 08/14/2020  Primary Discharge Diagnosis: NSTEMI  Secondary Discharge Diagnosis: Hypertension Hyperlipidemia Type II DM CAD s/p CABG HFrEF   Hospital Course:   80 y.o. Caucasian male with hypertension, hyperlipidemia, type 2 DM, CAD s/p CABG (LIMA-LAD, SVG-RCA, SVG-OM1) in 1995, prior MI's and PCI to native RCA and SVG-RCA, h/o stroke without residual deficits, HFrEF EF 35-40%, asymptomatic carotid artery stenosis, admitted with NSTEMI  Patient underwent successful bypass graft intervention, details below:   LM: Normal LAD: 100% occlusion after D1 LCx: 100% prox occlusion RCA: 100% mid occlusion with ISR LIMA-LAD: Patent SVG-OM3: Patent SVG-RCA: Patent prox-mid stent with 40% lumen loss                90% and 80% lesions in distal part of SVG-RCA                90% lesion at SVG-to RCA anastomosis  LVEF 40-45%. LVEDP 18 mmHg  Successful percutaneous coronary intervention SVG-RCA and SVG to RCA anastamosis Direct stent placement 3.5 X 38 mm Resolute Onyx drug-eluting stent SVG-RCA w/0% residual stenosis, TIMI III flow PTCA and stent placement 2.5 X 12 mm Resolute Onyx drug-eluting stent SVG to RCA anastomosis w/0% residual stenosis, TIMI III flow   Patient is here today for follow-up.  He was recently admitted to the hospital with a non-ST elevation myocardial infarction.  The patient had an 80% blockage as well as a 90% blockage in the saphenous vein graft to the right coronary artery.  Past medical history significant for three-vessel CABG in 1995.  Recently he had an echocardiogram that showed an ejection fraction of 30 to 40%.  At that time it was recommended trying Entresto to improve systolic function.  He has a history of dyspnea on exertion due to a combination of congestive heart failure as well as underlying interstitial  pulmonary fibrosis.  We were trying to improve his ejection fraction to try to help his breathing.  After discharge from the hospital, the patient states that he felt terrible.  Therefore he stopped all of his medication over the last week including his aspirin and Plavix.  He is not taking any medication.  He states that he feels much better now.  He believes that the medicines may have caused his heart attack.  He is afraid to take the Vascepa.  He states that he felt so bad he does not want to take the [place].  He has not experienced any bleeding or bruising.  He is seen no melena or hematochezia  Past Medical History:  Diagnosis Date  . Abnormal exercise myocardial perfusion study    03/2018- 38% ef, see report  . Arthritis   . Chronic kidney disease   . Congestive heart failure (CHF) ([place]) 5/17   EF 35-40% [name]  . Coronary artery disease    a. 1995 s/p CABG x 3 (VG->RCA, LIMA->LAD, VG->OM);  b. 07/2008 Inf MI/Cath/PCI: VG->RCA 99 - treated with Taxus DES (61mm), LIMA and VG->OM patent, LAD 100, LCX 100, RCA 60d, EF 50%;  c. 09/2008 PCI native RCA  w/ 2.5x23 Xience DES, VG->RCA stent patent;  c. 05/2010 Cath: Native 3VD with 3/3 patent grafts, native RCA w 80% ISR prox to graft insertion-->Med Rx.  . DOE (dyspnea on exertion)   . ED (erectile dysfunction)   . GERD (gastroesophageal  reflux disease)    occ  . Gout   . H/O hiatal hernia   . Hyperlipidemia   . Hypertension   . Myocardial infarction ([place])   . Neuropathy    toes  . Obesity   . RBBB (right bundle branch block)   . Stenosis of right internal carotid artery    50-69% (2014)  . Type II diabetes mellitus ([place])   . Umbilical hernia    a. s/p repair.  . Vertigo     Past Surgical History:  Procedure Laterality Date  . ANKLE ARTHROSCOPY WITH DRILLING/MICROFRACTURE Left 04/13/2013   Procedure: LEFT ANKLE ARTHROSCOPY WITH EXTENSIVE DEBRIEDMENT;  Surgeon: Wylene Simmer, MD;  Location: [facility];   Service: Orthopedics;  Laterality: Left;  . ANKLE SURGERY Left 08/18/2013   DR HEWITT  . CARDIAC CATHETERIZATION  2000   stents x3  . CARDIAC CATHETERIZATION  02/15/2014   Procedure: LEFT HEART CATH AND CORS/GRAFTS ANGIOGRAPHY;  Surgeon: Laverda Page, MD;  Location: [facility] CATH LAB;  Service: Cardiovascular;;  . COLONOSCOPY    . CORONARY ARTERY BYPASS GRAFT  1995   LIMA to LAD, SVG to RCA, SVG to OM.   Marland Kitchen CORONARY STENT INTERVENTION N/A 08/12/2020   Procedure: CORONARY STENT INTERVENTION;  Surgeon: Nigel Mormon, MD;  Location: [facility] CV LAB;  Service: Cardiovascular;  Laterality: N/A;  . LEFT HEART CATH AND CORS/GRAFTS ANGIOGRAPHY N/A 08/12/2020   Procedure: LEFT HEART CATH AND CORS/GRAFTS ANGIOGRAPHY;  Surgeon: Nigel Mormon, MD;  Location: [place] [facility] CV LAB;  Service: Cardiovascular;  Laterality: N/A;  . TOTAL ANKLE ARTHROPLASTY Left 08/17/2013   Procedure: LEFT TOTAL ANKLE REPLACEMENT WITH POSSIBLE GASTROC RECESSION ;  Surgeon: Wylene Simmer, MD;  Location: [facility];  Service: Orthopedics;  Laterality: Left;  . UMBILICAL HERNIA REPAIR  12/2007   Current Outpatient Medications on File Prior to Visit  Medication Sig Dispense Refill  . allopurinol (ZYLOPRIM) 100 MG tablet TAKE 1 TABLET EVERY DAY (Patient taking differently: Take 100 mg by mouth daily.) 90 tablet 3  . aspirin EC 81 MG tablet Take 1 tablet (81 mg total) by mouth daily.    . clopidogrel (PLAVIX) 75 MG tablet TAKE 8 TABLETS (600 MG TOTAL) BY MOUTH ONCE FOR 1 DOSE TONIGHT THEN TAKE 1 TABLET DAILY 38 tablet 0  . clopidogrel (PLAVIX) 75 MG tablet TAKE 8 TABLETS (600MG ) BY MOUTH TONINGHT AS A SINGLE DOSE THEN TAKE 1 TABLET (75 MG TOTAL) BY MOUTH DAILY. 30 tablet 1  . ENTRESTO 49-51 MG TAKE 1 TABLET TWICE DAILY 180 tablet 3  . Icosapent Ethyl (VASCEPA) 0.5 g CAPS Take 4 capsules by mouth 2 (two) times daily. (Patient taking differently: Take 2 g by mouth 2 (two) times daily.) 720 capsule 3  . metFORMIN (GLUCOPHAGE XR) 500  MG 24 hr tablet Hold on hospital discharge (08/13/2020) Resume on 08/15/2020 1 tablet 0  . nitroGLYCERIN (NITROSTAT) 0.4 MG SL tablet Place 1 tablet (0.4 mg total) under the tongue every 5 (five) minutes x 3 doses as needed for chest pain. 25 tablet 12  . Omega-3 Fatty Acids (FISH OIL) 1000 MG CAPS Take 1,000 mg by mouth daily.    . pantoprazole (PROTONIX) 40 MG tablet Take 1 tablet (40 mg total) by mouth daily. (Patient not taking: Reported on 08/12/2020) 90 tablet 3  . pseudoephedrine (SUDAFED) 30 MG tablet Take 30 mg by mouth daily as needed for congestion.    . rosuvastatin (CRESTOR) 40 MG tablet TAKE 1 TABLET EVERY  DAY (Patient taking differently: Take 40 mg by mouth daily.) 90 tablet 1  . triamcinolone (KENALOG) 0.1 % Apply 1 application topically 2 (two) times daily as needed (for rashes). 90 g 3   No current facility-administered medications on file prior to visit.   Allergies  Allergen Reactions  . Brilinta [Ticagrelor] Shortness Of Breath  . Crestor [Rosuvastatin Calcium] Other (See Comments)    Muscle pain- tolerating this 2022, however  . Lipitor [Atorvastatin] Other (See Comments)    Intolerable muscle pain all over    Social History   Socioeconomic History  . Marital status: Married    Spouse name: Not on file  . Number of children: 4  . Years of education: Not on file  . Highest education level: Not on file  Occupational History  . Not on file  Tobacco Use  . Smoking status: Former Smoker    Packs/day: 1.00    Years: 9.00    Pack years: 9.00    Types: Cigarettes    Quit date: 10/31/1970    Years since quitting: 49.8  . Smokeless tobacco: Former Systems developer    Types: Chew  . Tobacco comment: chewed for 2 years  Vaping Use  . Vaping Use: Never used  Substance and Sexual Activity  . Alcohol use: Not Currently    Comment: occ wine last 6 months  . Drug use: No  . Sexual activity: Yes  Other Topics Concern  . Not on file  Social History Narrative   Lives in  [place] with wife.  Retired.   Social Determinants of Health   Financial Resource Strain: Low Risk   . Difficulty of Paying Living Expenses: Not very hard  Food Insecurity: Not on file  Transportation Needs: Not on file  Physical Activity: Not on file  Stress: Not on file  Social Connections: Not on file  Intimate Partner Violence: Not on file     Review of Systems  All other systems reviewed and are negative.      Objective:   Physical Exam Vitals reviewed.  Constitutional:      General: He is not in acute distress.    Appearance: He is obese. He is not ill-appearing or toxic-appearing.  Cardiovascular:     Rate and Rhythm: Normal rate and regular rhythm.     Heart sounds: No murmur heard. No friction rub. No gallop.   Pulmonary:     Effort: Pulmonary effort is normal. No respiratory distress.     Breath sounds: No stridor. No wheezing or rhonchi.  Abdominal:     General: Abdomen is flat. Bowel sounds are normal. There is no distension.     Palpations: Abdomen is soft.     Tenderness: There is no abdominal tenderness. There is no guarding.  Musculoskeletal:     Right lower leg: No edema.     Left lower leg: No edema.  Neurological:     Mental Status: He is alert.           Assessment & Plan:  Interstitial pulmonary disease ([place]) - Plan: Ambulatory referral to Pulmonology  ASCVD (arteriosclerotic cardiovascular disease) - Plan: CBC with Differential/Platelet, COMPLETE METABOLIC PANEL WITH GFR  Pulmonary fibrosis ([facility])  Congestive heart failure, unspecified HF chronicity, unspecified heart failure type ([place])  Mixed hyperlipidemia  Primary hypertension  Coronary artery disease involving native coronary artery of native heart with unstable angina pectoris [facility])  Patient states that the medicines I gave him were making him feel bad so he stopped  them.  I was very blunt with the patient.  I begged him to resume the aspirin and Plavix.  Without these 2  medications he is at high risk for in-stent restenosis and a heart attack.  I explained to him that the stents themselves are thrombogenic without the antiplatelet agents and that he is at high risk for complications if he does not at least resume these 2 medications.  He is also been on Crestor for quite some time so he is willing to take the Crestor and the Metformin.  I do not feel that the Vascepa had anything to do with the way he felt.  However he is hesitant to take this medication.  Despite the evidence of reduction in myocardial infarctions he is hesitant to resume the medication due to the gelcap that it is in.  He is also hesitant to take the [facility].  I suggested that he schedule a follow-up visit with his cardiologist to discuss whether he would benefit from [facility] as the patient seems to be uncertain about my recommendations.  I truly believe that he thinks "my medicines gave him a heart attack" because he said as much.  I tried to explain that the medicines had nothing to do with his heart attack however I do not feel that I was successful.  Therefore perhaps he would listen to a second opinion from his cardiologist.  However, in no uncertain terms, my medical advice is that he resume the aspirin and Plavix and Crestor immediately as I feel he would be at high risk for complications without them.

## 2020-08-31 LAB — CBC WITH DIFFERENTIAL/PLATELET
Absolute Monocytes: 630 cells/uL (ref 200–950)
Basophils Absolute: 60 cells/uL (ref 0–200)
Basophils Relative: 0.8 %
Eosinophils Absolute: 338 cells/uL (ref 15–500)
Eosinophils Relative: 4.5 %
HCT: 40.7 % (ref 38.5–50.0)
Hemoglobin: 13.8 g/dL (ref 13.2–17.1)
Lymphs Abs: 2888 cells/uL (ref 850–3900)
MCH: 31.6 pg (ref 27.0–33.0)
MCHC: 33.9 g/dL (ref 32.0–36.0)
MCV: 93.1 fL (ref 80.0–100.0)
MPV: 9.4 fL (ref 7.5–12.5)
Monocytes Relative: 8.4 %
Neutro Abs: 3585 cells/uL (ref 1500–7800)
Neutrophils Relative %: 47.8 %
Platelets: 246 10*3/uL (ref 140–400)
RBC: 4.37 10*6/uL (ref 4.20–5.80)
RDW: 12.5 % (ref 11.0–15.0)
Total Lymphocyte: 38.5 %
WBC: 7.5 10*3/uL (ref 3.8–10.8)

## 2020-08-31 LAB — COMPLETE METABOLIC PANEL WITH GFR
AG Ratio: 1.2 (calc) (ref 1.0–2.5)
ALT: 11 U/L (ref 9–46)
AST: 20 U/L (ref 10–35)
Albumin: 4.2 g/dL (ref 3.6–5.1)
Alkaline phosphatase (APISO): 46 U/L (ref 35–144)
BUN/Creatinine Ratio: 13 (calc) (ref 6–22)
BUN: 16 mg/dL (ref 7–25)
CO2: 25 mmol/L (ref 20–32)
Calcium: 9.8 mg/dL (ref 8.6–10.3)
Chloride: 104 mmol/L (ref 98–110)
Creat: 1.23 mg/dL — ABNORMAL HIGH (ref 0.70–1.18)
GFR, Est African American: 65 mL/min/{1.73_m2} (ref >=60)
GFR, Est Non African American: 56 mL/min/{1.73_m2} — ABNORMAL LOW (ref >=60)
Globulin: 3.6 g/dL (calc) (ref 1.9–3.7)
Glucose, Bld: 92 mg/dL (ref 65–99)
Potassium: 4.6 mmol/L (ref 3.5–5.3)
Sodium: 139 mmol/L (ref 135–146)
Total Bilirubin: 0.4 mg/dL (ref 0.2–1.2)
Total Protein: 7.8 g/dL (ref 6.1–8.1)

## 2020-09-03 ENCOUNTER — Telehealth: Payer: Self-pay | Admitting: Pharmacist

## 2020-09-03 NOTE — Telephone Encounter (Signed)
Pt called stating that he has been feeling "really tired and weak" since recent hospitalization and being started on his guideline directed medication therapy. Pt reports that he hasnt been sleeping well over the past few days as well and thus also contributing to his irritability and fatigueness. Recent PCP OV last week where it was noted that pt stopped his GDMT due to pt's concerns that his symptoms are associated with his recent medications. Reviewed in detail pt's medication list and the reason why he is on his guideline directed medication therapy. Reviewed in detail common ADRs associated with each medication.  Pt agreeable to continue with ASA 81, plavix 75 mg, Entresto 49/51 mg BID, Crestor 40 mg, metformin 1000 mg, and NTG 0.4 mg SL daily. Pt reports he would like to hold Vascepa to see if his symptom improves or not. Pt agreeable to track his BP daily moving forward as well. Recommended pt try melatonin or benadryl nightly to see if it helps with his insomnia concerns. Pt agreeable to continue current medications till next OV with [name] on 09/27/20

## 2020-09-04 ENCOUNTER — Other Ambulatory Visit: Payer: Self-pay

## 2020-09-04 ENCOUNTER — Observation Stay (HOSPITAL_COMMUNITY)
Admission: EM | Admit: 2020-09-04 | Discharge: 2020-09-05 | Disposition: A | Discharge status: Home / Self Care | Payer: Medicare HMO | Attending: Cardiology | Admitting: Cardiology

## 2020-09-04 ENCOUNTER — Emergency Department (HOSPITAL_COMMUNITY): Payer: Medicare HMO

## 2020-09-04 DIAGNOSIS — I5042 Chronic combined systolic (congestive) and diastolic (congestive) heart failure: Secondary | ICD-10-CM | POA: Insufficient documentation

## 2020-09-04 DIAGNOSIS — I130 Hypertensive heart and chronic kidney disease with heart failure and stage 1 through stage 4 chronic kidney disease, or unspecified chronic kidney disease: Secondary | ICD-10-CM | POA: Insufficient documentation

## 2020-09-04 DIAGNOSIS — K449 Diaphragmatic hernia without obstruction or gangrene: Secondary | ICD-10-CM | POA: Diagnosis not present

## 2020-09-04 DIAGNOSIS — Z8673 Personal history of transient ischemic attack (TIA), and cerebral infarction without residual deficits: Secondary | ICD-10-CM | POA: Insufficient documentation

## 2020-09-04 DIAGNOSIS — I4510 Unspecified right bundle-branch block: Secondary | ICD-10-CM | POA: Diagnosis not present

## 2020-09-04 DIAGNOSIS — I213 ST elevation (STEMI) myocardial infarction of unspecified site: Secondary | ICD-10-CM | POA: Diagnosis not present

## 2020-09-04 DIAGNOSIS — E119 Type 2 diabetes mellitus without complications: Secondary | ICD-10-CM | POA: Diagnosis not present

## 2020-09-04 DIAGNOSIS — R0789 Other chest pain: Secondary | ICD-10-CM | POA: Diagnosis not present

## 2020-09-04 DIAGNOSIS — Z951 Presence of aortocoronary bypass graft: Secondary | ICD-10-CM | POA: Insufficient documentation

## 2020-09-04 DIAGNOSIS — Z20822 Contact with and (suspected) exposure to covid-19: Secondary | ICD-10-CM | POA: Diagnosis not present

## 2020-09-04 DIAGNOSIS — Z7984 Long term (current) use of oral hypoglycemic drugs: Secondary | ICD-10-CM | POA: Diagnosis not present

## 2020-09-04 DIAGNOSIS — R079 Chest pain, unspecified: Secondary | ICD-10-CM | POA: Diagnosis not present

## 2020-09-04 DIAGNOSIS — I25110 Atherosclerotic heart disease of native coronary artery with unstable angina pectoris: Secondary | ICD-10-CM | POA: Diagnosis not present

## 2020-09-04 DIAGNOSIS — Z87891 Personal history of nicotine dependence: Secondary | ICD-10-CM | POA: Diagnosis not present

## 2020-09-04 DIAGNOSIS — Z96662 Presence of left artificial ankle joint: Secondary | ICD-10-CM | POA: Insufficient documentation

## 2020-09-04 DIAGNOSIS — I517 Cardiomegaly: Secondary | ICD-10-CM | POA: Diagnosis not present

## 2020-09-04 DIAGNOSIS — N189 Chronic kidney disease, unspecified: Secondary | ICD-10-CM | POA: Insufficient documentation

## 2020-09-04 DIAGNOSIS — Z955 Presence of coronary angioplasty implant and graft: Secondary | ICD-10-CM | POA: Insufficient documentation

## 2020-09-04 DIAGNOSIS — E1122 Type 2 diabetes mellitus with diabetic chronic kidney disease: Secondary | ICD-10-CM | POA: Diagnosis not present

## 2020-09-04 DIAGNOSIS — I200 Unstable angina: Secondary | ICD-10-CM | POA: Diagnosis not present

## 2020-09-04 DIAGNOSIS — Z79899 Other long term (current) drug therapy: Secondary | ICD-10-CM | POA: Insufficient documentation

## 2020-09-04 DIAGNOSIS — I25700 Atherosclerosis of coronary artery bypass graft(s), unspecified, with unstable angina pectoris: Secondary | ICD-10-CM | POA: Diagnosis not present

## 2020-09-04 DIAGNOSIS — Z7982 Long term (current) use of aspirin: Secondary | ICD-10-CM | POA: Diagnosis not present

## 2020-09-04 DIAGNOSIS — R000 Tachycardia, unspecified: Secondary | ICD-10-CM | POA: Diagnosis not present

## 2020-09-04 LAB — CBC
HCT: 40.3 % (ref 39.0–52.0)
Hemoglobin: 13.2 g/dL (ref 13.0–17.0)
MCH: 31.3 pg (ref 26.0–34.0)
MCHC: 32.8 g/dL (ref 30.0–36.0)
MCV: 95.5 fL (ref 80.0–100.0)
Platelets: 230 K/uL (ref 150–400)
RBC: 4.22 MIL/uL (ref 4.22–5.81)
RDW: 12.6 % (ref 11.5–15.5)
WBC: 8.4 K/uL (ref 4.0–10.5)
nRBC: 0 % (ref 0.0–0.2)

## 2020-09-04 LAB — GLUCOSE, CAPILLARY
Glucose-Capillary: 113 mg/dL — ABNORMAL HIGH (ref 70–99)
Glucose-Capillary: 188 mg/dL — ABNORMAL HIGH (ref 70–99)

## 2020-09-04 LAB — BASIC METABOLIC PANEL WITH GFR
Anion gap: 9 (ref 5–15)
BUN: 16 mg/dL (ref 8–23)
CO2: 21 mmol/L — ABNORMAL LOW (ref 22–32)
Calcium: 9.1 mg/dL (ref 8.9–10.3)
Chloride: 105 mmol/L (ref 98–111)
Creatinine, Ser: 1.5 mg/dL — ABNORMAL HIGH (ref 0.61–1.24)
GFR, Estimated: 48 mL/min — ABNORMAL LOW
Glucose, Bld: 98 mg/dL (ref 70–99)
Potassium: 4.4 mmol/L (ref 3.5–5.1)
Sodium: 135 mmol/L (ref 135–145)

## 2020-09-04 LAB — TROPONIN I (HIGH SENSITIVITY)
Troponin I (High Sensitivity): 9 ng/L (ref <18)
Troponin I (High Sensitivity): 10 ng/L (ref <18)

## 2020-09-04 LAB — SARS CORONAVIRUS 2 (TAT 6-24 HRS): SARS Coronavirus 2: NEGATIVE

## 2020-09-04 MED ORDER — ASPIRIN 81 MG PO CHEW: 324.0000 mg | CHEWABLE_TABLET | ORAL | Status: AC

## 2020-09-04 MED ORDER — ASPIRIN 300 MG RE SUPP: 300.0000 mg | RECTAL | Status: AC

## 2020-09-04 MED ORDER — INSULIN ASPART 100 UNIT/ML ~~LOC~~ SOLN
0.0000 [IU] | Freq: Three times a day (TID) | SUBCUTANEOUS | Status: DC
  Administered 2020-09-05: 1 [IU] via SUBCUTANEOUS

## 2020-09-04 MED ORDER — ACETAMINOPHEN 325 MG PO TABS: 650.0000 mg | ORAL_TABLET | ORAL | Status: DC | PRN

## 2020-09-04 MED ORDER — INSULIN ASPART 100 UNIT/ML ~~LOC~~ SOLN: 0.0000 [IU] | Freq: Every day | SUBCUTANEOUS | Status: DC

## 2020-09-04 MED ORDER — INSULIN ASPART 100 UNIT/ML ~~LOC~~ SOLN
3.0000 [IU] | Freq: Three times a day (TID) | SUBCUTANEOUS | Status: DC
  Administered 2020-09-05: 3 [IU] via SUBCUTANEOUS

## 2020-09-04 MED ORDER — HEPARIN BOLUS VIA INFUSION
4000.0000 [IU] | Freq: Once | INTRAVENOUS | Status: AC
  Administered 2020-09-04: 4000 [IU] via INTRAVENOUS
  Filled 2020-09-04: qty 4000

## 2020-09-04 MED ORDER — HEPARIN (PORCINE) 25000 UT/250ML-% IV SOLN
1100.0000 [IU]/h | INTRAVENOUS | Status: DC
  Administered 2020-09-04: 800 [IU]/h via INTRAVENOUS
  Filled 2020-09-04: qty 250

## 2020-09-04 MED ORDER — NITROGLYCERIN 0.4 MG SL SUBL: 0.4000 mg | SUBLINGUAL_TABLET | SUBLINGUAL | Status: DC | PRN

## 2020-09-04 MED ORDER — ONDANSETRON HCL 4 MG/2ML IJ SOLN: 4.0000 mg | Freq: Four times a day (QID) | INTRAMUSCULAR | Status: DC | PRN

## 2020-09-04 MED ORDER — ASPIRIN EC 81 MG PO TBEC
81.0000 mg | DELAYED_RELEASE_TABLET | Freq: Every day | ORAL | Status: DC
  Administered 2020-09-05: 81 mg via ORAL
  Filled 2020-09-04: qty 1

## 2020-09-04 NOTE — Progress Notes (Signed)
80 y.o. Caucasian male with hypertension, hyperlipidemia, type 2 DM, CAD s/p CABG (LIMA-LAD, SVG-RCA, SVG-OM1) in 1995, prior MI's and PCI to native RCA and SVG-RCA, h/o stroke without residual deficits, HFrEF EF 35-40%, asymptomatic carotid artery stenosis, underwent drug-eluting stents in SVG-RCA for NSTEMI in 07/2020.  Patient is now presenting with chest pain.  Reportedly stopped taking all his medications, including Plavix for few days, as he was feeling "bad", and "dizzy".  Apparently started feeling great after stopping medications, until having had chest pain while feeling with is gone this afternoon.  Episode lasted for 15 minutes, responded to 2 nitroglycerin.  Currently chest pain-free.  Blood pressure and heart rate soft.  Not on beta-blocker after last hospitalization due to resting bradycardia and borderline low blood pressure.  I am very concerned about stent thrombosis, although EKG does not show any acute MI. Will admit for possible unstable angina/NSTEMI.  Continue aspirin, Plavix, Crestor, start heparin.  Hold Entresto due to borderline low blood pressure.   Nigel Mormon, MD Pager: [phone] Office: [phone]

## 2020-09-04 NOTE — ED Provider Notes (Addendum)
[facility] EMERGENCY DEPARTMENT Provider Note   CSN: [id number] Arrival date & time: 09/04/20  1529     History Chief Complaint  Patient presents with  . Chest Pain    Chris [name]. is a 80 y.o. male.  He has a history of significant coronary disease and had 2 stents placed to his grafts just 2 weeks ago.  Today he experienced similar severe chest pressure that lasted about 20 minutes.  Improved after second nitro.  Currently is pain-free.  Has baseline shortness of breath due to pulmonary fibrosis.  Denies being diaphoretic nauseous or vomiting with the pain episode.  Pain is similar to his cardiac pain.  The history is provided by the patient.  Chest Pain Pain location:  Substernal area Pain quality: pressure   Pain radiates to:  Does not radiate Pain severity:  Severe Onset quality:  Sudden Duration:  20 minutes Timing:  Constant Progression:  Resolved Chronicity:  Recurrent Relieved by:  Nitroglycerin Worsened by:  Nothing Ineffective treatments:  None tried Associated symptoms: shortness of breath (baseline)   Associated symptoms: no abdominal pain, no back pain, no cough, no diaphoresis, no fever, no headache, no nausea and no vomiting   Risk factors: coronary artery disease, high cholesterol, hypertension and male sex        Past Medical History:  Diagnosis Date  . Abnormal exercise myocardial perfusion study    03/2018- 38% ef, see report  . Arthritis   . Chronic kidney disease   . Congestive heart failure (CHF) ([place]) 5/17   EF 35-40% [name]  . Coronary artery disease    a. 1995 s/p CABG x 3 (VG->RCA, LIMA->LAD, VG->OM);  b. 07/2008 Inf MI/Cath/PCI: VG->RCA 99 - treated with Taxus DES (60mm), LIMA and VG->OM patent, LAD 100, LCX 100, RCA 60d, EF 50%;  c. 09/2008 PCI native RCA  w/ 2.5x23 Xience DES, VG->RCA stent patent;  c. 05/2010 Cath: Native 3VD with 3/3 patent grafts, native RCA w 80% ISR prox to graft insertion-->Med Rx.  .  DOE (dyspnea on exertion)   . ED (erectile dysfunction)   . GERD (gastroesophageal reflux disease)    occ  . Gout   . H/O hiatal hernia   . Hyperlipidemia   . Hypertension   . Myocardial infarction ([place])   . Neuropathy    toes  . Obesity   . RBBB (right bundle branch block)   . Stenosis of right internal carotid artery    50-69% (2014)  . Type II diabetes mellitus ([place])   . Umbilical hernia    a. s/p repair.  . Vertigo     Patient Active Problem List   Diagnosis Date Noted  . Coronary artery disease involving native coronary artery of native heart with unstable angina pectoris ([place]) 08/12/2020  . Abnormal exercise myocardial perfusion study   . Congestive heart failure (CHF) ([place])   . NSTEMI (non-ST elevated myocardial infarction) ([place]) 02/15/2014  . Type II diabetes mellitus ([place])   . Arthritis of left ankle 08/17/2013  . Stenosis of right internal carotid artery   . Precordial pain 06/03/2012  . TIA (transient ischemic attack) 10/02/2011  . Peripheral neuropathy 04/14/2011  . CAD (coronary artery disease) 11/04/2010  . Hypertension 11/04/2010  . Renal insufficiency 11/04/2010  . Obese 11/04/2010  . CVA (cerebral vascular accident) ([place]) 11/04/2010  . Hyperlipemia 11/04/2010    Past Surgical History:  Procedure Laterality Date  . ANKLE ARTHROSCOPY WITH DRILLING/MICROFRACTURE Left 04/13/2013  Procedure: LEFT ANKLE ARTHROSCOPY WITH EXTENSIVE DEBRIEDMENT;  Surgeon: Wylene Simmer, MD;  Location: [facility];  Service: Orthopedics;  Laterality: Left;  . ANKLE SURGERY Left 08/18/2013   DR HEWITT  . CARDIAC CATHETERIZATION  2000   stents x3  . CARDIAC CATHETERIZATION  02/15/2014   Procedure: LEFT HEART CATH AND CORS/GRAFTS ANGIOGRAPHY;  Surgeon: Laverda Page, MD;  Location: [facility] CATH LAB;  Service: Cardiovascular;;  . COLONOSCOPY    . CORONARY ARTERY BYPASS GRAFT  1995   LIMA to LAD, SVG to RCA, SVG to OM.   Marland Kitchen CORONARY STENT INTERVENTION N/A 08/12/2020    Procedure: CORONARY STENT INTERVENTION;  Surgeon: Nigel Mormon, MD;  Location: [facility] CV LAB;  Service: Cardiovascular;  Laterality: N/A;  . LEFT HEART CATH AND CORS/GRAFTS ANGIOGRAPHY N/A 08/12/2020   Procedure: LEFT HEART CATH AND CORS/GRAFTS ANGIOGRAPHY;  Surgeon: Nigel Mormon, MD;  Location: [facility] CV LAB;  Service: Cardiovascular;  Laterality: N/A;  . TOTAL ANKLE ARTHROPLASTY Left 08/17/2013   Procedure: LEFT TOTAL ANKLE REPLACEMENT WITH POSSIBLE GASTROC RECESSION ;  Surgeon: Wylene Simmer, MD;  Location: [facility];  Service: Orthopedics;  Laterality: Left;  . UMBILICAL HERNIA REPAIR  12/2007       Family History  Problem Relation Age of Onset  . Aneurysm Mother   . Heart disease Father   . Heart attack Father   . Stroke Brother     Social History   Tobacco Use  . Smoking status: Former Smoker    Packs/day: 1.00    Years: 9.00    Pack years: 9.00    Types: Cigarettes    Quit date: 10/31/1970    Years since quitting: 49.8  . Smokeless tobacco: Former Systems developer    Types: Chew  . Tobacco comment: chewed for 2 years  Vaping Use  . Vaping Use: Never used  Substance Use Topics  . Alcohol use: Not Currently    Comment: occ wine last 6 months  . Drug use: No    Home Medications Prior to Admission medications   Medication Sig Start Date End Date Taking? Authorizing Provider  allopurinol (ZYLOPRIM) 100 MG tablet TAKE 1 TABLET EVERY DAY Patient not taking: Reported on 09/03/2020 11/30/19   Annie Main, FNP  aspirin EC 81 MG tablet Take 1 tablet (81 mg total) by mouth daily. 02/16/14   Adrian Prows, MD  clopidogrel (PLAVIX) 75 MG tablet TAKE 8 TABLETS (600 MG TOTAL) BY MOUTH ONCE FOR 1 DOSE TONIGHT THEN TAKE 1 TABLET DAILY 08/13/20 08/13/21  Nigel Mormon, MD  ENTRESTO 49-51 MG TAKE 1 TABLET TWICE DAILY 08/16/20   Susy Frizzle, MD  Icosapent Ethyl (VASCEPA) 0.5 g CAPS Take 4 capsules by mouth 2 (two) times daily. Patient not taking: Reported on 09/03/2020  05/26/20   Adrian Prows, MD  metFORMIN (GLUCOPHAGE XR) 500 MG 24 hr tablet Hold on hospital discharge (08/13/2020) Resume on 08/15/2020 08/13/20   Nigel Mormon, MD  nitroGLYCERIN (NITROSTAT) 0.4 MG SL tablet Place 1 tablet (0.4 mg total) under the tongue every 5 (five) minutes x 3 doses as needed for chest pain. 08/14/20   Adrian Prows, MD  pantoprazole (PROTONIX) 40 MG tablet Take 1 tablet (40 mg total) by mouth daily. Patient not taking: No sig reported 01/09/19   Susy Frizzle, MD  pseudoephedrine (SUDAFED) 30 MG tablet Take 30 mg by mouth daily as needed for congestion. Patient not taking: No sig reported    [provider]  rosuvastatin (CRESTOR) 40 MG tablet Take 40 mg by mouth daily.    [provider]  triamcinolone (KENALOG) 0.1 % Apply 1 application topically 2 (two) times daily as needed (for rashes). Patient not taking: No sig reported 08/27/20   Susy Frizzle, MD    Allergies    Brilinta [ticagrelor], Crestor [rosuvastatin calcium], and Lipitor [atorvastatin]  Review of Systems   Review of Systems  Constitutional: Negative for diaphoresis and fever.  HENT: Negative for sore throat.   Eyes: Negative for visual disturbance.  Respiratory: Positive for shortness of breath (baseline). Negative for cough.   Cardiovascular: Positive for chest pain.  Gastrointestinal: Negative for abdominal pain, nausea and vomiting.  Genitourinary: Negative for dysuria.  Musculoskeletal: Negative for back pain.  Skin: Negative for rash.  Neurological: Negative for headaches.    Physical Exam Updated Vital Signs BP 97/65 (BP Location: Right Arm)   Pulse 68   Temp 97.7 F (36.5 C) (Oral)   Resp 16   Ht 5\' 8"  (1.727 m)   Wt 66.7 kg   SpO2 98%   BMI 22.35 kg/m   Physical Exam Vitals and nursing note reviewed.  Constitutional:      Appearance: He is well-developed.  HENT:     Head: Normocephalic and atraumatic.  Eyes:     Conjunctiva/sclera: Conjunctivae normal.   Cardiovascular:     Rate and Rhythm: Normal rate and regular rhythm.     Heart sounds: Normal heart sounds. No murmur heard.   Pulmonary:     Effort: Pulmonary effort is normal. No respiratory distress.     Breath sounds: Normal breath sounds.  Abdominal:     Palpations: Abdomen is soft.     Tenderness: There is no abdominal tenderness.  Musculoskeletal:        General: Normal range of motion.     Cervical back: Neck supple.     Right lower leg: No tenderness. No edema.     Left lower leg: No tenderness. No edema.  Skin:    General: Skin is warm and dry.     Capillary Refill: Capillary refill takes less than 2 seconds.  Neurological:     General: No focal deficit present.     Mental Status: He is alert.     ED Results / Procedures / Treatments   Labs (all labs ordered are listed, but only abnormal results are displayed) Labs Reviewed  BASIC METABOLIC PANEL - Abnormal; Notable for the following components:      Result Value   CO2 21 (*)    Creatinine, Ser 1.50 (*)    GFR, Estimated 48 (*)    All other components within normal limits  GLUCOSE, CAPILLARY - Abnormal; Notable for the following components:   Glucose-Capillary 113 (*)    All other components within normal limits  GLUCOSE, CAPILLARY - Abnormal; Notable for the following components:   Glucose-Capillary 188 (*)    All other components within normal limits  SARS CORONAVIRUS 2 (TAT 6-24 HRS)  CBC  BASIC METABOLIC PANEL  HEPARIN LEVEL (UNFRACTIONATED)  TROPONIN I (HIGH SENSITIVITY)  TROPONIN I (HIGH SENSITIVITY)    EKG EKG Interpretation  Date/Time:  Wednesday September 04 2020 15:53:24 EDT Ventricular Rate:  56 PR Interval:  170 QRS Duration: 135 QT Interval:  446 QTC Calculation: 431 R Axis:   138 Text Interpretation: Sinus rhythm RBBB and LPFB Inferior infarct, old No significant change since prior 2/22 Confirmed by Aletta Edouard [phone]) on 09/04/2020 4:02:05 PM  Radiology DG Chest Port 1  View  Result Date: 09/04/2020 CLINICAL DATA:  Chest pain and pressure. EXAM: PORTABLE CHEST 1 VIEW COMPARISON:  Radiograph 08/12/2020, CT 10/03/2019 FINDINGS: Post median sternotomy and CABG. Stable mild cardiomegaly. Unchanged mediastinal contours with mild bilateral hilar prominence. Retrocardiac hiatal hernia is again seen. Subpleural reticulation and interstitial coarsening, corresponding to fibrosis on prior chest CT. No confluent consolidation, large pleural effusion or pneumothorax. IMPRESSION: 1. No acute chest findings. 2. Stable mild cardiomegaly. Chronic interstitial lung disease. 3. Hiatal hernia. Electronically Signed   By: Keith Rake M.D.   On: 09/04/2020 16:34    Procedures .Critical Care Performed by: Hayden Rasmussen, MD Authorized by: Hayden Rasmussen, MD   Critical care provider statement:    Critical care time (minutes):  30   Critical care time was exclusive of:  Separately billable procedures and treating other patients   Critical care was necessary to treat or prevent imminent or life-threatening deterioration of the following conditions:  Cardiac failure   Critical care was time spent personally by me on the following activities:  Discussions with consultants, evaluation of patient's response to treatment, examination of patient, ordering and performing treatments and interventions, ordering and review of laboratory studies, ordering and review of radiographic studies, pulse oximetry, re-evaluation of patient's condition, obtaining history from patient or surrogate, review of old charts and development of treatment plan with patient or surrogate     Medications Ordered in ED Medications  aspirin EC tablet 81 mg (has no administration in time range)  nitroGLYCERIN (NITROSTAT) SL tablet 0.4 mg (has no administration in time range)  acetaminophen (TYLENOL) tablet 650 mg (has no administration in time range)  ondansetron (ZOFRAN) injection 4 mg (has no  administration in time range)  heparin ADULT infusion 100 units/mL (25000 units/270mL) (800 Units/hr Intravenous New Bag/Given 09/04/20 1744)  insulin aspart (novoLOG) injection 0-9 Units (has no administration in time range)  insulin aspart (novoLOG) injection 0-5 Units (has no administration in time range)  insulin aspart (novoLOG) injection 3 Units (has no administration in time range)  aspirin chewable tablet 324 mg (324 mg Oral Given by EMS 09/04/20 1745)    Or  aspirin suppository 300 mg ( Rectal See Alternative 09/04/20 1745)  heparin bolus via infusion 4,000 Units (4,000 Units Intravenous Bolus from Bag 09/04/20 1744)    ED Course  I have reviewed the triage vital signs and the nursing notes.  Pertinent labs & imaging results that were available during my care of the patient were reviewed by me and considered in my medical decision making (see chart for details).  Clinical Course as of 09/04/20 2220  Wed Sep 04, 2020  1610 Patient has already received aspirin and nitro by EMS.  Currently pain-free. [MB]  35 Discussed with Dr. Virgina Jock cardiology who will see the patient in consult. [MB]  1626 Patient states since discharge he has been having dizzy lightheaded spells and yesterday talk to the pharmacist and had some of his medications adjusted.  His blood pressure is soft here at around 100. [MB]  1640 Chest x-ray without any acute infiltrates.  Does have some evidence of some pulmonary fibrosis. [MB]    Clinical Course User Index [MB] Hayden Rasmussen, MD   MDM Rules/Calculators/A&P                         This patient complains of chest pain in the setting of a recent cardiac stent; this involves  an extensive number of treatment Options and is a complaint that carries with it a high risk of complications and Morbidity. The differential includes stent closure, ACS, unstable angina, musculoskeletal, PE, pneumothorax, reflux  I ordered, reviewed and interpreted labs, which  included CBC with normal white count normal hemoglobin, chemistries normal although mildly low bicarb and elevated creatinine, troponin flat, Covid testing negative  I ordered imaging studies which included chest x-ray and I independently    visualized and interpreted imaging which showed no acute disease Additional history obtained from patient's spouse and EMS Previous records obtained and reviewed in epic including recent cath notes I consulted Dr. Virgina Jock and discussed lab and imaging findings  Critical Interventions: None  After the interventions stated above, I reevaluated the patient and found patient to be pain-free.  Cardiology admitting patient to their service.  On discussion with the patient it sounds like he has not been taking his Plavix after the cath as he thought it was causing him problems.   Final Clinical Impression(s) / ED Diagnoses Final diagnoses:  Unstable angina [facility])    Rx / DC Orders ED Discharge Orders    None       Hayden Rasmussen, MD 09/04/20 2225    Hayden Rasmussen, MD 09/17/20 [phone]

## 2020-09-04 NOTE — Progress Notes (Signed)
Pt arrived to rm 23 from ED. Initiated tele. CHG wipe given. Oriented pt to rm. VSS. Call bell within reach.   Lavenia Atlas, RN

## 2020-09-04 NOTE — H&P (Signed)
Chris [name]. is an 80 y.o. male.   Chief Complaint: chest pain HPI:   Chris [name].  is a 80 y.o. 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    Patient had a coronary angiogram on 01/2014 which demonstrated native LAD 100, LCX 100, patent grafts and also patent stents. He underwent nuclear stress test on 04/11/2018 which had revealed large inferolateral scar with very minimal peri-infarct ischemia with EF 38% considered to be high risk, but clinically I felt it was low risk and recommended aggressive medical therapy.  Patient then presented 08/12/2020 with recurrence of angina patient was admitted with NSTEMI and underwent successful stenting to bypass grafts (SVG-RCA and SVG to RCA anastamosis).   Patient now presents to [facility] emergency department with complaints of chest pain which started while he was sitting at home at approximately 3:00 PM.  Patient describes his pain as pressure 6/10 severity.  Patient took 2 sublingual nitroglycerin tablets, and chest pain resolved approximately 15 minutes after onset.   Patient does admit that since discharge on 08/13/2020 he has missed approximately 3 days worth of Plavix doses as he discontinued all of his medications because he was "not feeling well".  Upon further questioning patient states that he has been having episodes of dizziness, particularly when standing.  Notably patient's blood pressure is soft.  Past Medical History:  Diagnosis Date  . Abnormal exercise myocardial perfusion study    03/2018- 38% ef, see report  . Arthritis   . Chronic kidney disease   . Congestive heart failure (CHF) ([place]) 5/17    EF 35-40% [name]  . Coronary artery disease    a. 1995 s/p CABG x 3 (VG->RCA, LIMA->LAD, VG->OM);  b. 07/2008 Inf MI/Cath/PCI: VG->RCA 99 - treated with Taxus DES (42mm), LIMA and VG->OM patent, LAD 100, LCX 100, RCA 60d, EF 50%;  c. 09/2008 PCI native RCA  w/ 2.5x23 Xience DES, VG->RCA stent patent;  c. 05/2010 Cath: Native 3VD with 3/3 patent grafts, native RCA w 80% ISR prox to graft insertion-->Med Rx.  . DOE (dyspnea on exertion)   . ED (erectile dysfunction)   . GERD (gastroesophageal reflux disease)    occ  . Gout   . H/O hiatal hernia   . Hyperlipidemia   . Hypertension   . Myocardial infarction ([place])   . Neuropathy    toes  . Obesity   . RBBB (right bundle branch block)   . Stenosis of right internal carotid artery    50-69% (2014)  . Type II diabetes mellitus ([place])   . Umbilical hernia    a. s/p repair.  . Vertigo     Past Surgical History:  Procedure Laterality Date  . ANKLE ARTHROSCOPY WITH DRILLING/MICROFRACTURE Left 04/13/2013   Procedure: LEFT ANKLE ARTHROSCOPY WITH EXTENSIVE DEBRIEDMENT;  Surgeon: Wylene Simmer, MD;  Location: [place];  Service: Orthopedics;  Laterality: Left;  . ANKLE SURGERY Left 08/18/2013   DR HEWITT  . CARDIAC CATHETERIZATION  2000   stents x3  . CARDIAC CATHETERIZATION  02/15/2014   Procedure: LEFT HEART CATH AND CORS/GRAFTS ANGIOGRAPHY;  Surgeon: Laverda Page, MD;  Location: [facility] CATH LAB;  Service: Cardiovascular;;  .  COLONOSCOPY    . CORONARY ARTERY BYPASS GRAFT  1995   LIMA to LAD, SVG to RCA, SVG to OM.   Marland Kitchen CORONARY STENT INTERVENTION N/A 08/12/2020   Procedure: CORONARY STENT INTERVENTION;  Surgeon: Nigel Mormon, MD;  Location: [place] CV LAB;  Service: Cardiovascular;  Laterality: N/A;  . LEFT HEART CATH AND CORS/GRAFTS ANGIOGRAPHY N/A 08/12/2020   Procedure: LEFT HEART CATH AND CORS/GRAFTS ANGIOGRAPHY;  Surgeon: Nigel Mormon, MD;  Location: [place] CV LAB;  Service: Cardiovascular;   Laterality: N/A;  . TOTAL ANKLE ARTHROPLASTY Left 08/17/2013   Procedure: LEFT TOTAL ANKLE REPLACEMENT WITH POSSIBLE GASTROC RECESSION ;  Surgeon: Wylene Simmer, MD;  Location: [place];  Service: Orthopedics;  Laterality: Left;  . UMBILICAL HERNIA REPAIR  12/2007    Family History  Problem Relation Age of Onset  . Aneurysm Mother   . Heart disease Father   . Heart attack Father   . Stroke Brother     Social History:  reports that he quit smoking about 49 years ago. His smoking use included cigarettes. He has a 9.00 pack-year smoking history. He has quit using smokeless tobacco.  His smokeless tobacco use included chew. He reports previous alcohol use. He reports that he does not use drugs.  Allergies:  Allergies  Allergen Reactions  . Brilinta [Ticagrelor] Shortness Of Breath  . Crestor [Rosuvastatin Calcium] Other (See Comments)    Muscle pain- tolerating this 2022, however  . Lipitor [Atorvastatin] Other (See Comments)    Intolerable muscle pain all over     Review of Systems  Constitutional: Negative for malaise/fatigue and weight gain.  Cardiovascular: Positive for chest pain. Negative for claudication, leg swelling, near-syncope, orthopnea, palpitations, paroxysmal nocturnal dyspnea and syncope.  Respiratory: Negative for shortness of breath.   Hematologic/Lymphatic: Does not bruise/bleed easily.  Gastrointestinal: Negative for melena.  Neurological: Positive for dizziness. Negative for weakness.  All other systems reviewed and are negative.    Blood pressure 105/60, pulse 67, temperature 97.7 F (36.5 C), temperature source Oral, resp. rate 18, height 5\' 8"  (1.727 m), weight 66.7 kg, SpO2 92 %. Body mass index is 22.35 kg/m.  Physical Exam Vitals and nursing note reviewed.  Constitutional:      General: He is not in acute distress.    Appearance: He is obese. He is not diaphoretic.  HENT:     Head: Normocephalic and atraumatic.     Right Ear: External ear normal.      Left Ear: External ear normal.     Nose: Nose normal.     Mouth/Throat:     Mouth: Mucous membranes are moist.  Eyes:     Extraocular Movements: Extraocular movements intact.     Conjunctiva/sclera: Conjunctivae normal.  Neck:     Vascular: No carotid bruit.  Cardiovascular:     Rate and Rhythm: Regular rhythm. Bradycardia present.     Pulses: Intact distal pulses.     Heart sounds: S1 normal and S2 normal. No murmur heard. No gallop.   Pulmonary:     Effort: Pulmonary effort is normal. No respiratory distress.     Breath sounds: Rales (bilateral bases) present. No wheezing or rhonchi.  Abdominal:     General: Abdomen is flat. There is no distension.     Palpations: Abdomen is soft.  Musculoskeletal:     Cervical back: Normal range of motion and neck supple.     Right lower leg: No edema.     Left lower leg:  No edema.  Skin:    General: Skin is warm and dry.     Capillary Refill: Capillary refill takes less than 2 seconds.  Neurological:     General: No focal deficit present.     Mental Status: He is alert and oriented to person, place, and time. Mental status is at baseline.     Results for orders placed or performed during the hospital encounter of 09/04/20 (from the past 48 hour(s))  CBC     Status: None   Collection Time: 09/04/20  4:03 PM  Result Value Ref Range   WBC 8.4 4.0 - 10.5 K/uL   RBC 4.22 4.22 - 5.81 MIL/uL   Hemoglobin 13.2 13.0 - 17.0 g/dL   HCT 40.3 39.0 - 52.0 %   MCV 95.5 80.0 - 100.0 fL   MCH 31.3 26.0 - 34.0 pg   MCHC 32.8 30.0 - 36.0 g/dL   RDW 12.6 11.5 - 15.5 %   Platelets 230 150 - 400 K/uL   nRBC 0.0 0.0 - 0.2 %    Comment: Performed at [facility], [place] [address], [place], [place] [address]    Labs:   Lab Results  Component Value Date   WBC 8.4 09/04/2020   HGB 13.2 09/04/2020   HCT 40.3 09/04/2020   MCV 95.5 09/04/2020   PLT 230 09/04/2020    Recent Labs  Lab 08/30/20 1456  NA 139  K 4.6  CL 104  CO2 25  BUN 16   CREATININE 1.23*  CALCIUM 9.8  PROT 7.8  BILITOT 0.4  ALT 11  AST 20  GLUCOSE 92    Lipid Panel     Component Value Date/Time   CHOL 110 08/13/2020 0306   CHOL 183 05/15/2020 1000   TRIG 230 (H) 08/13/2020 0306   HDL 35 (L) 08/13/2020 0306   HDL 40 05/15/2020 1000   CHOLHDL 3.1 08/13/2020 0306   VLDL 46 (H) 08/13/2020 0306   LDLCALC 29 08/13/2020 0306   LDLCALC 87 05/15/2020 1000   LDLCALC 69 08/01/2019 0835    BNP (last 3 results) No results for input(s): BNP in the last 8760 hours.  HEMOGLOBIN A1C Lab Results  Component Value Date   HGBA1C 6.4 (H) 08/12/2020   MPG 136.98 08/12/2020    Cardiac Panel (last 3 results) No results for input(s): CKTOTAL, CKMB, RELINDX in the last 8760 hours.  Invalid input(s): TROPONINHS  Lab Results  Component Value Date   YCXKGYJ 856 (H) 08/20/2008   CKMB 28.9 (H) 08/20/2008     TSH No results for input(s): TSH in the last 8760 hours.   (Not in a hospital admission)     Current Facility-Administered Medications:  .  acetaminophen (TYLENOL) tablet 650 mg, 650 mg, Oral, Q4H PRN, Patwardhan, Manish J, MD .  aspirin chewable tablet 324 mg, 324 mg, Oral, NOW **OR** aspirin suppository 300 mg, 300 mg, Rectal, NOW, Patwardhan, Manish J, MD .  Derrill Memo ON 09/05/2020] aspirin EC tablet 81 mg, 81 mg, Oral, Daily, Patwardhan, Manish J, MD .  nitroGLYCERIN (NITROSTAT) SL tablet 0.4 mg, 0.4 mg, Sublingual, Q5 Min x 3 PRN, Patwardhan, Manish J, MD .  ondansetron (ZOFRAN) injection 4 mg, 4 mg, Intravenous, Q6H PRN, Patwardhan, Manish J, MD  Current Outpatient Medications:  .  allopurinol (ZYLOPRIM) 100 MG tablet, TAKE 1 TABLET EVERY DAY (Patient not taking: Reported on 09/03/2020), Disp: 90 tablet, Rfl: 3 .  aspirin EC 81 MG tablet, Take 1 tablet (81 mg total)  by mouth daily., Disp: , Rfl:  .  clopidogrel (PLAVIX) 75 MG tablet, TAKE 8 TABLETS (600 MG TOTAL) BY MOUTH ONCE FOR 1 DOSE TONIGHT THEN TAKE 1 TABLET DAILY, Disp: 38 tablet, Rfl:  0 .  pantoprazole (PROTONIX) 40 MG tablet, Take 1 tablet (40 mg total) by mouth daily. (Patient not taking: No sig reported), Disp: 90 tablet, Rfl: 3 .  rosuvastatin (CRESTOR) 40 MG tablet, Take 40 mg by mouth daily., Disp: , Rfl:    Today's Vitals   09/04/20 1615 09/04/20 1630 09/04/20 1645 09/04/20 1700  BP: 100/66 105/60 (!) 84/56 96/67  Pulse: (!) 58 67 (!) 57 (!) 57  Resp: 14 18 13 13   Temp:      TempSrc:      SpO2: 97% 92% 97% 97%  Weight:      Height:      PainSc:       Body mass index is 22.35 kg/m.   Radiology:  CT angio chest 07/25/2019: 1. No evidence of pulmonary embolism. 2. Scattered ground-glass density throughout both lungs with increased central and subpleural reticulation and areas of mosaic attenuation. No frank honeycombing or significant bronchiectasis. Differential considerations include chronic hypersensitivity pneumonitis or idiopathic interstitial pneumonia such as NSIP. Pulmonary consultation is suggested, as well as high-resolution chest CT follow-up in 6 months. 3. 5 mm pulmonary nodule along the right major fissure. No follow-up needed if patient is low-risk. Non-contrast chest CT can be considered in 12 months if patient is high-risk.  4. Aortic atherosclerosis (ICD10-I70.0).  CT scan of the chest 10/04/2019:  1. 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 Findings are consistent with UIP per consensus guidelines. 2. Borderline mild cardiomegaly. 3. Moderate hiatal hernia. 4. Aortic Atherosclerosis   Chest x-ray 09/04/2020: Post median sternotomy and CABG. Stable mild cardiomegaly. Unchanged mediastinal contours with mild bilateral hilar prominence. Retrocardiac hiatal hernia is again seen. Subpleural reticulation and interstitial coarsening, corresponding to fibrosis on prior chest CT. No confluent consolidation, large pleural effusion  or pneumothorax   CARDIAC STUDIES: Coronary angiogram  02/15/2014:  Native LAD 100, LCX 100, patent grafts and also patent stents. SVG to RCA mid Taxus DES (48mm) patent, ( h/o Inf MI/Cath/PCI in 09/2008 PCI) native RCA w/ 2.5x23 Xience DES patent, patent LIMA to LAD and SVG to OM patent,  RCA 60% distal. Small vessel disease.   Lexiscan myoview stress test 04/11/2018: 1. Lexiscan stress test was performed. Exercise capacity was not assessed. No stress symptoms reported. Blood pressure was normal.  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  Stress EKG is non diagnostic for ischemia as it is a pharmacologic stress. 2. The overall quality of the study is good.  Left ventricular cavity is noted to be normal on the rest and stress studies.  Gated SPECT imaging demonstrates hypokinesis of the basal inferior, basal inferolateral, mid inferior, mid inferolateral, apical inferior and apical lateral myocardial wall(s).  The left ventricular ejection fraction was calculated or visually estimated to be 38%. LSPECT images reveal a large sized, medium intensity, minimally reversible perfusion defect suggestive of large infarct with minimal peri infarct ischemia in LCx/PDA territory.   3. High risk study.  Overnight oximetry test 10/11/2019  Desaturation below 88% 17.6 minutes.  Echocardiogram 08/09/2019:  Left ventricle cavity is normal in size. Moderate concentric hypertrophy of the left ventricle. Left ventricle regional  wall motion findings: Basal inferolateral, Basal inferior, Mid inferolateral and Mid inferior akinesis. Moderate decrease in global wall motion. Visual EF is 35-40%. Doppler evidence of grade II (pseudonormal) diastolic dysfunction, elevated LAP.  c.f. echo. of 03/15/2018, no significant change.   Carotid artery duplex12/05/2019: Stenosis  in the right internal carotid artery (50-69%). Stenosis in the right external carotid artery (<50%). Stenosis in the left internal carotid artery (16-49%).  Antegrade right vertebral artery flow. Antegrade left vertebral artery flow. Follow up in six months is appropriate if clinically indicated. No significant   Left heart catheterization 08/12/2020: LM: Normal LAD: 100% occlusion after D1 LCx: 100% prox occlusion RCA: 100% mid occlusion with ISR LIMA-LAD: Patent SVG-OM3: Patent SVG-RCA: Patent prox-mid stent with 40% lumen loss                90% and 80% lesions in distal part of SVG-RCA                90% lesion at SVG-to RCA anastomosis  LVEF 40-45%. LVEDP 18 mmHg  Successful percutaneous coronary intervention SVG-RCA and SVG to RCA anastamosis Direct stent placement 3.5 X 38 mm Resolute Onyx drug-eluting stent SVG-RCA w/0% residual stenosis, TIMI III flow PTCA and stent placement 2.5 X 12 mm Resolute Onyx drug-eluting stent SVG to RCA anastomosis w/0% residual stenosis, TIMI III flow  Echocardiogram 08/13/2020: 1. Left ventricular ejection fraction, by estimation, is 45 to 50%. The  left ventricle has mildly decreased function. The left ventricle  demonstrates regional wall motion abnormalities (see scoring  diagram/findings for description). There is mild left  ventricular hypertrophy. Left ventricular diastolic parameters are  consistent with Grade I diastolic dysfunction (impaired relaxation). There  is mild hypokinesis of the left ventricular, basal inferior wall.  2. Right ventricular systolic function is normal. The right ventricular  size is normal.  3. No significant valvular abnormalities.  4. Normal right atrial pressure.  5. Compared to previous outpatient study on 08/09/2019, LVEF has improved  from 35-40%.   EKG:  09/04/2020: Sinus bradycardia at a rate of 56 bpm.  Left atrial enlargement.  Right axis.  Right bundle branch block.  Compared to EKG 08/12/2020,  no significant change.  08/12/2020: Sinus bradycardia at a rate of 52 bpm.  Left atrial enlargement.  Right axis.  Right bundle branch block.  Bifascicular block.  Low voltage complexes, consider pulmonary disease pattern.  Compared to EKG 07/31/2019, no significant change.   Assessment/Plan  80 y.o. Caucasian male  with history of hypertension, diabetes mellitus, CAD, history of MI status post CABG with multiple coronary interventions, most recently admitted for NSTEMI 08/12/2020 with successful stenting to bypass graft.  Patient also has history of stroke, chronic systolic and diastolic heart failure with LVEF 45-50% (07/2020), and asymptomatic carotid artery stenosis.   1.  Unstable angina Admit for further evaluation and management. Patient's EKG in the emergency department today is without significant change compared to previous.  However patient symptoms are concerning for angina, and in view of the fact that he has recently missed approximately 3 days of Plavix dosing concerned for stent stenosis, particularly as patient underwent stenting to bypass graft less than 30 days ago. Trend troponins.  Troponin may be elevated in view of recent NSTEMI, however if troponins trend up we will consider repeat cardiac catheterization. Will initiate heparin drip. Continue aspirin and Plavix Will hold off on starting nitroglycerin at this time in view of patient's soft blood pressure.  2.  Coronary artery disease Continue  aspirin, Plavix, and rosuvastatin. Patient is presently not on beta-blocker therapy in view of resting bradycardia Discussed at length with patient and his wife, who is present at bedside, the importance of medication compliance particularly in regard to aspirin and Plavix.  Patient verbalized understanding and agreement.  3.  Hypertension Patient's blood pressure is presently soft and he complains of orthostatic dizziness.  We will hold Entresto in view of soft blood pressure.  4.   Diabetes mellitus Managed for glycemic control protocol.  5.  Chronic combined systolic and diastolic heart failure In view of patient's soft blood pressure and resting bradycardia Will hold home Entresto and continue to avoid beta-blocker therapy at this time.   Patient was seen in collaboration with Dr. Virgina Jock. He also reviewed patient's chart and examined the patient. Dr. Virgina Jock is in agreement of the plan.    Alethia Berthold, PA-C 09/04/2020, 5:25 PM Office: [phone]

## 2020-09-04 NOTE — ED Triage Notes (Signed)
BIB EMS for chest pressure similar pain when he had STEMI 2 weeks ago. Pt got 2 stents. This was a sudden onset, pt took 2 nitro. Chest pain relieved now. Per EMS, pt denied N, V or diaphoresis. Pt got 324 aspirin.  BP 114/56, HR 70 with R BBB, 96% room air.

## 2020-09-04 NOTE — Progress Notes (Signed)
ANTICOAGULATION CONSULT NOTE - Initial Consult  Pharmacy Consult for heparin Indication: chest pain/ACS  Allergies  Allergen Reactions  . Brilinta [Ticagrelor] Shortness Of Breath  . Crestor [Rosuvastatin Calcium] Other (See Comments)    Muscle pain- tolerating this 2022, however  . Lipitor [Atorvastatin] Other (See Comments)    Intolerable muscle pain all over     Patient Measurements: Height: 5\' 8"  (172.7 cm) Weight: 66.7 kg (147 lb) IBW/kg (Calculated) : 68.4 Heparin Dosing Weight: 66.7 kg  Vital Signs: Temp: 97.7 F (36.5 C) (04/13 1547) Temp Source: Oral (04/13 1547) BP: 96/67 (04/13 1700) Pulse Rate: 57 (04/13 1700)  Labs: Recent Labs    09/04/20 1603  HGB 13.2  HCT 40.3  PLT 230    Estimated Creatinine Clearance: 47.4 mL/min (A) (by C-G formula based on SCr of 1.23 mg/dL (H)).   Medical History: Past Medical History:  Diagnosis Date  . Abnormal exercise myocardial perfusion study    03/2018- 38% ef, see report  . Arthritis   . Chronic kidney disease   . Congestive heart failure (CHF) ([place]) 5/17   EF 35-40% [name]  . Coronary artery disease    a. 1995 s/p CABG x 3 (VG->RCA, LIMA->LAD, VG->OM);  b. 07/2008 Inf MI/Cath/PCI: VG->RCA 99 - treated with Taxus DES (70mm), LIMA and VG->OM patent, LAD 100, LCX 100, RCA 60d, EF 50%;  c. 09/2008 PCI native RCA  w/ 2.5x23 Xience DES, VG->RCA stent patent;  c. 05/2010 Cath: Native 3VD with 3/3 patent grafts, native RCA w 80% ISR prox to graft insertion-->Med Rx.  . DOE (dyspnea on exertion)   . ED (erectile dysfunction)   . GERD (gastroesophageal reflux disease)    occ  . Gout   . H/O hiatal hernia   . Hyperlipidemia   . Hypertension   . Myocardial infarction ([place])   . Neuropathy    toes  . Obesity   . RBBB (right bundle branch block)   . Stenosis of right internal carotid artery    50-69% (2014)  . Type II diabetes mellitus ([place])   . Umbilical hernia    a. s/p repair.  . Vertigo     Medications:   Scheduled:  . aspirin  324 mg Oral NOW   Or  . aspirin  300 mg Rectal NOW  . [START ON 09/05/2020] aspirin EC  81 mg Oral Daily    Assessment: 54 yom with hx of CAD s/p CABG with recent DES to SVG-RCA for NSTEMI on 07/2020 - no AC PTA. Notes indicate possibly stopping his medications a few days ago.   Hgb 13.2, plt 230. No s/sx of bleeding.  Goal of Therapy:  Heparin level 0.3-0.7 units/ml Monitor platelets by anticoagulation protocol: Yes   Plan:  Give 4000 units bolus x 1 Start heparin infusion at 800 units/hr Check anti-Xa level in 8 hours and daily while on heparin Continue to monitor H&H and platelets  Antonietta Jewel, PharmD, [place] Pharmacist  Phone: [phone] 09/04/2020 5:21 PM  Please check AMION for all [place] phone numbers After 10:00 PM, call [place] [phone]

## 2020-09-05 ENCOUNTER — Other Ambulatory Visit (HOSPITAL_COMMUNITY): Payer: Self-pay

## 2020-09-05 DIAGNOSIS — I130 Hypertensive heart and chronic kidney disease with heart failure and stage 1 through stage 4 chronic kidney disease, or unspecified chronic kidney disease: Secondary | ICD-10-CM | POA: Diagnosis not present

## 2020-09-05 DIAGNOSIS — I5042 Chronic combined systolic (congestive) and diastolic (congestive) heart failure: Secondary | ICD-10-CM | POA: Diagnosis not present

## 2020-09-05 DIAGNOSIS — E1122 Type 2 diabetes mellitus with diabetic chronic kidney disease: Secondary | ICD-10-CM | POA: Diagnosis not present

## 2020-09-05 DIAGNOSIS — I25110 Atherosclerotic heart disease of native coronary artery with unstable angina pectoris: Secondary | ICD-10-CM | POA: Diagnosis not present

## 2020-09-05 DIAGNOSIS — Z8673 Personal history of transient ischemic attack (TIA), and cerebral infarction without residual deficits: Secondary | ICD-10-CM | POA: Diagnosis not present

## 2020-09-05 DIAGNOSIS — N189 Chronic kidney disease, unspecified: Secondary | ICD-10-CM | POA: Diagnosis not present

## 2020-09-05 DIAGNOSIS — Z951 Presence of aortocoronary bypass graft: Secondary | ICD-10-CM | POA: Diagnosis not present

## 2020-09-05 DIAGNOSIS — Z955 Presence of coronary angioplasty implant and graft: Secondary | ICD-10-CM | POA: Diagnosis not present

## 2020-09-05 DIAGNOSIS — Z20822 Contact with and (suspected) exposure to covid-19: Secondary | ICD-10-CM | POA: Diagnosis not present

## 2020-09-05 DIAGNOSIS — R072 Precordial pain: Secondary | ICD-10-CM | POA: Diagnosis not present

## 2020-09-05 LAB — BASIC METABOLIC PANEL
Anion gap: 7 (ref 5–15)
BUN: 16 mg/dL (ref 8–23)
CO2: 24 mmol/L (ref 22–32)
Calcium: 8.9 mg/dL (ref 8.9–10.3)
Chloride: 106 mmol/L (ref 98–111)
Creatinine, Ser: 1.39 mg/dL — ABNORMAL HIGH (ref 0.61–1.24)
GFR, Estimated: 52 mL/min — ABNORMAL LOW (ref >60)
Glucose, Bld: 95 mg/dL (ref 70–99)
Potassium: 3.9 mmol/L (ref 3.5–5.1)
Sodium: 137 mmol/L (ref 135–145)

## 2020-09-05 LAB — GLUCOSE, CAPILLARY
Glucose-Capillary: 112 mg/dL — ABNORMAL HIGH (ref 70–99)
Glucose-Capillary: 129 mg/dL — ABNORMAL HIGH (ref 70–99)

## 2020-09-05 LAB — HEPARIN LEVEL (UNFRACTIONATED): Heparin Unfractionated: <0.1 IU/mL — ABNORMAL LOW (ref 0.30–0.70)

## 2020-09-05 MED ORDER — METOPROLOL SUCCINATE ER 25 MG PO TB24
12.5000 mg | ORAL_TABLET | Freq: Every day | ORAL | 1 refills | Status: DC
  Filled 2020-09-05: qty 30, 60d supply, fill #0

## 2020-09-05 MED ORDER — CLOPIDOGREL BISULFATE 75 MG PO TABS
300.0000 mg | ORAL_TABLET | Freq: Once | ORAL | Status: AC
  Administered 2020-09-05: 300 mg via ORAL
  Filled 2020-09-05: qty 4

## 2020-09-05 MED ORDER — HEPARIN BOLUS VIA INFUSION
2000.0000 [IU] | Freq: Once | INTRAVENOUS | Status: AC
  Administered 2020-09-05: 2000 [IU] via INTRAVENOUS
  Filled 2020-09-05: qty 2000

## 2020-09-05 MED ORDER — METOPROLOL SUCCINATE ER 25 MG PO TB24
12.5000 mg | ORAL_TABLET | Freq: Every day | ORAL | Status: DC
  Administered 2020-09-05: 12.5 mg via ORAL
  Filled 2020-09-05: qty 1

## 2020-09-05 NOTE — Progress Notes (Signed)
[place] for heparin Indication: chest pain/ACS  Allergies  Allergen Reactions  . Brilinta [Ticagrelor] Shortness Of Breath  . Crestor [Rosuvastatin Calcium] Other (See Comments)    Muscle pain- tolerating this 2022, however  . Lipitor [Atorvastatin] Other (See Comments)    Intolerable muscle pain all over     Patient Measurements: Height: 5\' 8"  (172.7 cm) Weight: 104.4 kg (230 lb 2.6 oz) IBW/kg (Calculated) : 68.4 Heparin Dosing Weight: 66.7 kg  Vital Signs: Temp: 97.9 F (36.6 C) (04/14 0052) Temp Source: Oral (04/14 0052) BP: 118/74 (04/14 0052) Pulse Rate: 72 (04/14 0052)  Labs: Recent Labs    09/04/20 1603 09/04/20 1859 09/05/20 0155  HGB 13.2  --   --   HCT 40.3  --   --   PLT 230  --   --   HEPARINUNFRC  --   --  <0.10*  CREATININE 1.50*  --  1.39*  TROPONINIHS 9 10  --     Estimated Creatinine Clearance: 52.1 mL/min (A) (by C-G formula based on SCr of 1.39 mg/dL (H)).   Assessment: 39 yom with hx of CAD s/p CABG with recent DES to SVG-RCA for NSTEMI on 07/2020 - no AC PTA. Notes indicate possibly stopping his medications a few days ago.   Heparin level undetectable on gtt at 800 units/hr. RN had to switch heparin site due to some bleeding at the site early in her shift - resolved now and heparin wasn't off for an extended period.  Goal of Therapy:  Heparin level 0.3-0.7 units/ml Monitor platelets by anticoagulation protocol: Yes   Plan:  Rebolus heparin 2000 units Increase heparin gtt to 1100 units/hr Will f/u 8 hr heparin level  Sherlon Handing, PharmD, BCPS Please see amion for complete clinical pharmacist phone list 09/05/2020 3:14 AM

## 2020-09-05 NOTE — Discharge Summary (Signed)
Physician Discharge Summary  Patient ID: Chris [name] Reasons. MRN: [id number] DOB/AGE: 11-09-1940 80 y.o.  Admit date: 09/04/2020 Discharge date: 09/05/2020  Primary Discharge Diagnosis: Chest pain  Secondary Discharge Diagnosis: Hypertension Hyperlipidemia Type II DM CAD s/p CABG HFrEF  HPI: 80 y.o. Caucasian male with hypertension, hyperlipidemia, type 2 DM, CAD s/p CABG (LIMA-LAD, SVG-RCA, SVG-OM1) in 1995, prior MI's and PCI to native RCA and SVG-RCA, h/o stroke without residual deficits, HFrEF EF 35-40%, asymptomatic carotid artery stenosis, underwent drug-eluting stents in SVG-RCA for NSTEMI in 07/2020.  Patient is now presenting with chest pain.  Reportedly stopped taking all his medications, including Plavix for few days, as he was feeling "bad", and "dizzy".  Apparently started feeling great after stopping medications, until having had chest pain while feeling with is gone this afternoon.  Episode lasted for 15 minutes, responded to 2 nitroglycerin.  Currently chest pain-free.  Blood pressure and heart rate soft.  Not on beta-blocker after last hospitalization due to resting bradycardia and borderline low blood pressure.  I am very concerned about stent thrombosis, although EKG does not show any acute MI. Will admit for possible unstable angina/NSTEMI.  Continue aspirin, Plavix, Crestor, start heparin. Hold Entresto due to borderline low blood pressure.   Hospital course: Fortunately, trop HS stayed negative (9,10) ruling out myocardial infarction. Given patient's recent non-compliance, I gave 300 mg plavix once, followed by 75 mg daily from tomorrow. I emphasized importance of plavix compliance. I started metoprolol succinate at low dose 12.5 mg daily. Given his low normal blood pressures and recent dizziness, I discontinued Entresto.      Discharge Exam: Blood pressure 104/90, pulse 78, temperature 98 F (36.7 C), temperature source Oral, resp. rate 17, height 5'  8" (1.727 m), weight 104.4 kg, SpO2 98 %.   Physical Exam Vitals and nursing note reviewed.  Constitutional:      General: He is not in acute distress.    Appearance: He is well-developed.  HENT:     Head: Normocephalic and atraumatic.  Eyes:     Conjunctiva/sclera: Conjunctivae normal.     Pupils: Pupils are equal, round, and reactive to light.  Neck:     Vascular: No JVD.  Cardiovascular:     Rate and Rhythm: Normal rate and regular rhythm.     Pulses: Normal pulses and intact distal pulses.     Heart sounds: No murmur heard.   Pulmonary:     Effort: Pulmonary effort is normal.     Breath sounds: Normal breath sounds. No wheezing or rales.  Abdominal:     General: Bowel sounds are normal.     Palpations: Abdomen is soft.     Tenderness: There is no rebound.  Musculoskeletal:        General: No tenderness. Normal range of motion.     Right lower leg: No edema.     Left lower leg: No edema.  Lymphadenopathy:     Cervical: No cervical adenopathy.  Skin:    General: Skin is warm and dry.  Neurological:     Mental Status: He is alert and oriented to person, place, and time.     Cranial Nerves: No cranial nerve deficit.       Significant Diagnostic Studies:  EKG 09/05/2020: Sinus rhythm 70 bpm.  Right bundle branch block.  Inferolateral infarct, age indeterminate.  Echocardiogram 08/13/2020: 1. Left ventricular ejection fraction, by estimation, is 45 to 50%. The  left ventricle has mildly decreased function. The left ventricle  demonstrates regional wall motion abnormalities (see scoring  diagram/findings for description). There is mild left  ventricular hypertrophy. Left ventricular diastolic parameters are  consistent with Grade I diastolic dysfunction (impaired relaxation). There  is mild hypokinesis of the left ventricular, basal inferior wall.  2. Right ventricular systolic function is normal. The right ventricular  size is normal.  3. No significant  valvular abnormalities.  4. Normal right atrial pressure.  5. Compared to previous outpatient study on 08/09/2019, LVEF has improved  from 35-40%.   Coronary intervention 08/12/2020: LM: Normal LAD: 100% occlusion after D1 LCx: 100% prox occlusion RCA: 100% mid occlusion with ISR LIMA-LAD: Patent SVG-OM3: Patent SVG-RCA: Patent prox-mid stent with 40% lumen loss 90% and 80% lesions in distal part of SVG-RCA 90% lesion at SVG-to RCA anastomosis  LVEF 40-45%. LVEDP 18 mmHg  Successful percutaneous coronary intervention SVG-RCA and SVG to RCA anastamosis Direct stent placement 3.5 X 38 mm Resolute Onyx drug-eluting stent SVG-RCA w/0% residual stenosis, TIMI III flow PTCA and stent placement 2.5 X 12 mm Resolute Onyx drug-eluting stent SVG to RCA anastomosis w/0% residual stenosis, TIMI III flow  Labs:   Lab Results  Component Value Date   WBC 8.4 09/04/2020   HGB 13.2 09/04/2020   HCT 40.3 09/04/2020   MCV 95.5 09/04/2020   PLT 230 09/04/2020    Recent Labs  Lab 08/30/20 1456 09/04/20 1603 09/05/20 0155  NA 139   < > 137  K 4.6   < > 3.9  CL 104   < > 106  CO2 25   < > 24  BUN 16   < > 16  CREATININE 1.23*   < > 1.39*  CALCIUM 9.8   < > 8.9  PROT 7.8  --   --   BILITOT 0.4  --   --   ALT 11  --   --   AST 20  --   --   GLUCOSE 92   < > 95   < > = values in this interval not displayed.    Lipid Panel     Component Value Date/Time   CHOL 110 08/13/2020 0306   CHOL 183 05/15/2020 1000   TRIG 230 (H) 08/13/2020 0306   HDL 35 (L) 08/13/2020 0306   HDL 40 05/15/2020 1000   CHOLHDL 3.1 08/13/2020 0306   VLDL 46 (H) 08/13/2020 0306   LDLCALC 29 08/13/2020 0306   LDLCALC 87 05/15/2020 1000   LDLCALC 69 08/01/2019 0835    BNP (last 3 results) No results for input(s): BNP in the last 8760 hours.  HEMOGLOBIN A1C Lab Results  Component Value Date   HGBA1C 6.4 (H) 08/12/2020   MPG 136.98 08/12/2020    Cardiac Panel (last 3  results) Results for Sindoni, MAHAD NEWSTROM (MRN [id number]) as of 09/05/2020 13:53  Ref. Range 09/04/2020 16:03 09/04/2020 18:59  Troponin I (High Sensitivity) Latest Ref Range: <18 ng/L 9 10    Lab Results  Component Value Date   CKTOTAL 422 (H) 08/20/2008   CKMB 28.9 (H) 08/20/2008   TROPONINI <0.30 02/16/2014      Radiology: CARDIAC CATHETERIZATION  Result Date: 08/12/2020 LM: Normal LAD: 100% occlusion after D1 LCx: 100% prox occlusion RCA: 100% mid occlusion with ISR LIMA-LAD: Patent SVG-OM3: Patent SVG-RCA: Patent prox-mid stent with 40% lumen loss                90% and 80% lesions in distal part of SVG-RCA  90% lesion at SVG-to RCA anastomosis LVEF 40-45%. LVEDP 18 mmHg Successful percutaneous coronary intervention SVG-RCA and SVG to RCA anastamosis Direct stent placement 3.5 X 38 mm Resolute Onyx drug-eluting stent SVG-RCA w/0% residual stenosis, TIMI III flow PTCA and stent placement 2.5 X 12 mm Resolute Onyx drug-eluting stent SVG to RCA anastomosis w/0% residual stenosis, TIMI III flow Nigel Mormon, MD Pager: [phone] Office: [phone]   DG Chest Port 1 View  Result Date: 09/04/2020 CLINICAL DATA:  Chest pain and pressure. EXAM: PORTABLE CHEST 1 VIEW COMPARISON:  Radiograph 08/12/2020, CT 10/03/2019 FINDINGS: Post median sternotomy and CABG. Stable mild cardiomegaly. Unchanged mediastinal contours with mild bilateral hilar prominence. Retrocardiac hiatal hernia is again seen. Subpleural reticulation and interstitial coarsening, corresponding to fibrosis on prior chest CT. No confluent consolidation, large pleural effusion or pneumothorax. IMPRESSION: 1. No acute chest findings. 2. Stable mild cardiomegaly. Chronic interstitial lung disease. 3. Hiatal hernia. Electronically Signed   By: Keith Rake M.D.   On: 09/04/2020 16:34   DG Chest Port 1 View  Result Date: 08/12/2020 CLINICAL DATA:  Chest pain EXAM: PORTABLE CHEST 1 VIEW COMPARISON:  07/24/2019  FINDINGS: The cardio pericardial silhouette is enlarged. Moderate hiatal hernia noted. Interstitial markings are diffusely coarsened with chronic features. The visualized bony structures of the thorax show no acute abnormality. Telemetry leads overlie the chest. IMPRESSION: 1. Enlargement of the cardiopericardial silhouette. 2. Moderate hiatal hernia. 3. Chronic interstitial lung disease. Electronically Signed   By: Misty Stanley M.D.   On: 08/12/2020 13:51   ECHOCARDIOGRAM COMPLETE  Result Date: 08/14/2020    ECHOCARDIOGRAM REPORT   Patient Name:   Chris [name]. Date of Exam: 08/13/2020 Medical Rec #:  [id number]         Height:       68.0 in Accession #:    [id number]        Weight:       231.3 lb Date of Birth:  01/07/1941        BSA:          2.174 m Patient Age:    37 years          BP:           119/84 mmHg Patient Gender: M                 HR:           71 bpm. Exam Location:  Inpatient Procedure: 2D Echo, Cardiac Doppler and Color Doppler Indications:     R07.9* Chest pain, unspecified  History:         Patient has prior history of Echocardiogram examinations, most                  recent 08/09/2019. CHF, Previous Myocardial Infarction and CAD,                  Arrythmias:RBBB; Risk Factors:Diabetes, Dyslipidemia and                  Hypertension. CKD. GERD.  Sonographer:     Jonelle Sidle Dance Referring Phys:  [id number] Reynold Bowen Tyreanna Bisesi Diagnosing Phys: Vernell Leep MD IMPRESSIONS  1. Left ventricular ejection fraction, by estimation, is 45 to 50%. The left ventricle has mildly decreased function. The left ventricle demonstrates regional wall motion abnormalities (see scoring diagram/findings for description). There is mild left ventricular hypertrophy. Left ventricular diastolic parameters are consistent with Grade I diastolic dysfunction (impaired relaxation). There is mild hypokinesis  of the left ventricular, basal inferior wall.  2. Right ventricular systolic function is normal. The right  ventricular size is normal.  3. No significant valvular abnormalities.  4. Normal right atrial pressure.  5. Compared to previous outpatient study on 08/09/2019, LVEF has improved from 35-40%. FINDINGS  Left Ventricle: Left ventricular ejection fraction, by estimation, is 45 to 50%. The left ventricle has mildly decreased function. The left ventricle demonstrates regional wall motion abnormalities. Mild hypokinesis of the left ventricular, basal inferior wall. The left ventricular internal cavity size was normal in size. There is mild left ventricular hypertrophy. Left ventricular diastolic parameters are consistent with Grade I diastolic dysfunction (impaired relaxation). Right Ventricle: The right ventricular size is normal. No increase in right ventricular wall thickness. Right ventricular systolic function is normal. Left Atrium: Left atrial size was normal in size. Right Atrium: Right atrial size was normal in size. Pericardium: There is no evidence of pericardial effusion. Mitral Valve: The mitral valve is grossly normal. No evidence of mitral valve regurgitation. Tricuspid Valve: The tricuspid valve is grossly normal. Tricuspid valve regurgitation is not demonstrated. Aortic Valve: The aortic valve is tricuspid. Aortic valve regurgitation is not visualized. Pulmonic Valve: The pulmonic valve was grossly normal. Pulmonic valve regurgitation is not visualized. Aorta: The aortic root and ascending aorta are structurally normal, with no evidence of dilitation. Venous: The inferior vena cava is normal in size with greater than 50% respiratory variability, suggesting right atrial pressure of 3 mmHg. IAS/Shunts: No atrial level shunt detected by color flow Doppler.  LEFT VENTRICLE PLAX 2D LVIDd:         3.50 cm  Diastology LVIDs:         2.70 cm  LV e' medial:    3.26 cm/s LV PW:         1.50 cm  LV E/e' medial:  26.3 LV IVS:        1.30 cm  LV e' lateral:   6.31 cm/s LVOT diam:     2.10 cm  LV E/e' lateral: 13.6 LV  SV:         51 LV SV Index:   23 LVOT Area:     3.46 cm  RIGHT VENTRICLE             IVC RV Basal diam:  2.60 cm     IVC diam: 1.50 cm RV S prime:     10.00 cm/s TAPSE (M-mode): 1.7 cm LEFT ATRIUM             Index       RIGHT ATRIUM           Index LA diam:        3.30 cm 1.52 cm/m  RA Area:     11.30 cm LA Vol (A2C):   27.0 ml 12.42 ml/m RA Volume:   20.50 ml  9.43 ml/m LA Vol (A4C):   60.0 ml 27.60 ml/m LA Biplane Vol: 43.0 ml 19.78 ml/m  AORTIC VALVE LVOT Vmax:   78.35 cm/s LVOT Vmean:  55.600 cm/s LVOT VTI:    0.147 m  AORTA Ao Root diam: 3.00 cm Ao Asc diam:  3.10 cm MITRAL VALVE MV Area (PHT): 3.60 cm     SHUNTS MV Decel Time: 211 msec     Systemic VTI:  0.15 m MV E velocity: 85.70 cm/s   Systemic Diam: 2.10 cm MV A velocity: 107.00 cm/s MV E/A ratio:  0.80 Tyjuan Demetro MD Electronically signed by Vernell Leep  MD Signature Date/Time: 08/14/2020/9:38:00 AM    Final       FOLLOW UP PLANS AND APPOINTMENTS Discharge Instructions    Diet - low sodium heart healthy   Complete by: As directed    Increase activity slowly   Complete by: As directed      Allergies as of 09/05/2020      Reactions   Brilinta [ticagrelor] Shortness Of Breath   Crestor [rosuvastatin Calcium] Other (See Comments)   Muscle pain- tolerating this 2022, however   Lipitor [atorvastatin] Other (See Comments)   Intolerable muscle pain all over       Medication List    STOP taking these medications   Entresto 49-51 MG Generic drug: sacubitril-valsartan   nitroGLYCERIN 0.4 MG SL tablet Commonly known as: NITROSTAT   pseudoephedrine 30 MG tablet Commonly known as: SUDAFED   triamcinolone cream 0.1 % Commonly known as: KENALOG   Vascepa 0.5 g Caps Generic drug: Icosapent Ethyl     TAKE these medications   acetaminophen 500 MG tablet Commonly known as: TYLENOL Take 500 mg by mouth every 6 (six) hours as needed for mild pain.   aspirin EC 81 MG tablet Take 1 tablet (81 mg total) by mouth  daily.   clopidogrel 75 MG tablet Commonly known as: PLAVIX TAKE 8 TABLETS (600 MG TOTAL) BY MOUTH ONCE FOR 1 DOSE TONIGHT THEN TAKE 1 TABLET DAILY   metFORMIN 500 MG 24 hr tablet Commonly known as: GLUCOPHAGE-XR Take 100 mg by mouth daily with breakfast. What changed: Another medication with the same name was removed. Continue taking this medication, and follow the directions you see here.   metoprolol succinate 25 MG 24 hr tablet Commonly known as: TOPROL-XL Take 1/2 tablet (12.5 mg total) by mouth daily.   pantoprazole 40 MG tablet Commonly known as: PROTONIX Take 1 tablet (40 mg total) by mouth daily.   rosuvastatin 40 MG tablet Commonly known as: CRESTOR Take 40 mg by mouth daily.       Follow-up Information    Adrian Prows, MD Follow up on 09/27/2020.   Specialty: Cardiology Why: 9:30 AM Contact information: [place] [address] [phone]                 Nigel Mormon, MD Pager: [phone] Office: [phone]

## 2020-09-05 NOTE — Care Management CC44 (Signed)
Condition Code 44 Documentation Completed  Patient Details  Name: Chris [name]. MRN: [id number] Date of Birth: 1941/02/19   Condition Code 44 given:  Yes Patient signature on Condition Code 44 notice:  Yes Documentation of 2 MD's agreement:  Yes Code 44 added to claim:  Yes    Dawayne Patricia, RN 09/05/2020, 11:30 AM

## 2020-09-05 NOTE — Care Management Obs Status (Signed)
[facility] NOTIFICATION   Patient Details  Name: Chris [name]. MRN: [id number] Date of Birth: 09/18/1940   Medicare Observation Status Notification Given:  Yes    Dawayne Patricia, [place] 09/05/2020, 11:29 AM

## 2020-09-05 NOTE — Progress Notes (Signed)
D/c tele and IV. Went over AVS with pt and all questions were answered.  ° °Prarthana Parlin S Brylan Seubert, RN ° °

## 2020-09-10 ENCOUNTER — Other Ambulatory Visit (HOSPITAL_COMMUNITY): Payer: Self-pay

## 2020-09-12 ENCOUNTER — Telehealth: Payer: Self-pay

## 2020-09-12 MED ORDER — CLOPIDOGREL BISULFATE 75 MG PO TABS: ORAL_TABLET | ORAL | 0 refills | Status: DC

## 2020-09-12 MED ORDER — METOPROLOL SUCCINATE ER 25 MG PO TB24: 12.5000 mg | ORAL_TABLET | Freq: Every day | ORAL | 1 refills | Status: DC

## 2020-09-12 NOTE — Telephone Encounter (Signed)
Error

## 2020-09-23 ENCOUNTER — Other Ambulatory Visit: Payer: Self-pay

## 2020-09-23 ENCOUNTER — Other Ambulatory Visit (HOSPITAL_COMMUNITY): Payer: Self-pay

## 2020-09-23 ENCOUNTER — Emergency Department (HOSPITAL_COMMUNITY)
Admission: EM | Admit: 2020-09-23 | Discharge: 2020-09-23 | Disposition: A | Discharge status: Home / Self Care | Payer: Medicare HMO | Attending: Emergency Medicine | Admitting: Emergency Medicine

## 2020-09-23 ENCOUNTER — Encounter (HOSPITAL_COMMUNITY): Payer: Self-pay | Admitting: Emergency Medicine

## 2020-09-23 ENCOUNTER — Emergency Department (HOSPITAL_COMMUNITY): Payer: Medicare HMO

## 2020-09-23 DIAGNOSIS — N201 Calculus of ureter: Secondary | ICD-10-CM | POA: Diagnosis not present

## 2020-09-23 DIAGNOSIS — R001 Bradycardia, unspecified: Secondary | ICD-10-CM | POA: Diagnosis not present

## 2020-09-23 DIAGNOSIS — Z87891 Personal history of nicotine dependence: Secondary | ICD-10-CM | POA: Insufficient documentation

## 2020-09-23 DIAGNOSIS — I130 Hypertensive heart and chronic kidney disease with heart failure and stage 1 through stage 4 chronic kidney disease, or unspecified chronic kidney disease: Secondary | ICD-10-CM | POA: Diagnosis not present

## 2020-09-23 DIAGNOSIS — I25110 Atherosclerotic heart disease of native coronary artery with unstable angina pectoris: Secondary | ICD-10-CM | POA: Insufficient documentation

## 2020-09-23 DIAGNOSIS — Z7982 Long term (current) use of aspirin: Secondary | ICD-10-CM | POA: Insufficient documentation

## 2020-09-23 DIAGNOSIS — Z951 Presence of aortocoronary bypass graft: Secondary | ICD-10-CM | POA: Diagnosis not present

## 2020-09-23 DIAGNOSIS — I10 Essential (primary) hypertension: Secondary | ICD-10-CM | POA: Diagnosis not present

## 2020-09-23 DIAGNOSIS — Z7902 Long term (current) use of antithrombotics/antiplatelets: Secondary | ICD-10-CM | POA: Diagnosis not present

## 2020-09-23 DIAGNOSIS — Z7984 Long term (current) use of oral hypoglycemic drugs: Secondary | ICD-10-CM | POA: Diagnosis not present

## 2020-09-23 DIAGNOSIS — K3189 Other diseases of stomach and duodenum: Secondary | ICD-10-CM | POA: Diagnosis not present

## 2020-09-23 DIAGNOSIS — R1032 Left lower quadrant pain: Secondary | ICD-10-CM | POA: Diagnosis present

## 2020-09-23 DIAGNOSIS — I5042 Chronic combined systolic (congestive) and diastolic (congestive) heart failure: Secondary | ICD-10-CM | POA: Insufficient documentation

## 2020-09-23 DIAGNOSIS — K449 Diaphragmatic hernia without obstruction or gangrene: Secondary | ICD-10-CM | POA: Diagnosis not present

## 2020-09-23 DIAGNOSIS — N189 Chronic kidney disease, unspecified: Secondary | ICD-10-CM | POA: Diagnosis not present

## 2020-09-23 DIAGNOSIS — E1122 Type 2 diabetes mellitus with diabetic chronic kidney disease: Secondary | ICD-10-CM | POA: Insufficient documentation

## 2020-09-23 DIAGNOSIS — R1084 Generalized abdominal pain: Secondary | ICD-10-CM | POA: Diagnosis not present

## 2020-09-23 DIAGNOSIS — I878 Other specified disorders of veins: Secondary | ICD-10-CM | POA: Diagnosis not present

## 2020-09-23 DIAGNOSIS — Z96662 Presence of left artificial ankle joint: Secondary | ICD-10-CM | POA: Insufficient documentation

## 2020-09-23 DIAGNOSIS — Z79899 Other long term (current) drug therapy: Secondary | ICD-10-CM | POA: Diagnosis not present

## 2020-09-23 DIAGNOSIS — N132 Hydronephrosis with renal and ureteral calculous obstruction: Secondary | ICD-10-CM | POA: Diagnosis not present

## 2020-09-23 DIAGNOSIS — R0902 Hypoxemia: Secondary | ICD-10-CM | POA: Diagnosis not present

## 2020-09-23 LAB — URINALYSIS, ROUTINE W REFLEX MICROSCOPIC
Bacteria, UA: NONE SEEN
Bilirubin Urine: NEGATIVE
Glucose, UA: NEGATIVE mg/dL
Ketones, ur: NEGATIVE mg/dL
Leukocytes,Ua: NEGATIVE
Nitrite: NEGATIVE
Protein, ur: NEGATIVE mg/dL
RBC / HPF: >50 RBC/hpf — ABNORMAL HIGH (ref 0–5)
Specific Gravity, Urine: 1.02 (ref 1.005–1.030)
pH: 5 (ref 5.0–8.0)

## 2020-09-23 LAB — COMPREHENSIVE METABOLIC PANEL
ALT: 16 U/L (ref 0–44)
AST: 24 U/L (ref 15–41)
Albumin: 3.4 g/dL — ABNORMAL LOW (ref 3.5–5.0)
Alkaline Phosphatase: 30 U/L — ABNORMAL LOW (ref 38–126)
Anion gap: 9 (ref 5–15)
BUN: 12 mg/dL (ref 8–23)
CO2: 24 mmol/L (ref 22–32)
Calcium: 9 mg/dL (ref 8.9–10.3)
Chloride: 107 mmol/L (ref 98–111)
Creatinine, Ser: 1.52 mg/dL — ABNORMAL HIGH (ref 0.61–1.24)
GFR, Estimated: 47 mL/min — ABNORMAL LOW (ref >60)
Glucose, Bld: 154 mg/dL — ABNORMAL HIGH (ref 70–99)
Potassium: 4 mmol/L (ref 3.5–5.1)
Sodium: 140 mmol/L (ref 135–145)
Total Bilirubin: 0.6 mg/dL (ref 0.3–1.2)
Total Protein: 6.9 g/dL (ref 6.5–8.1)

## 2020-09-23 LAB — CBC
HCT: 39.1 % (ref 39.0–52.0)
Hemoglobin: 12.8 g/dL — ABNORMAL LOW (ref 13.0–17.0)
MCH: 30.8 pg (ref 26.0–34.0)
MCHC: 32.7 g/dL (ref 30.0–36.0)
MCV: 94.2 fL (ref 80.0–100.0)
Platelets: 228 10*3/uL (ref 150–400)
RBC: 4.15 MIL/uL — ABNORMAL LOW (ref 4.22–5.81)
RDW: 12.7 % (ref 11.5–15.5)
WBC: 10.2 10*3/uL (ref 4.0–10.5)
nRBC: 0 % (ref 0.0–0.2)

## 2020-09-23 LAB — LIPASE, BLOOD: Lipase: 118 U/L — ABNORMAL HIGH (ref 11–51)

## 2020-09-23 MED ORDER — TAMSULOSIN HCL 0.4 MG PO CAPS
0.4000 mg | ORAL_CAPSULE | Freq: Every day | ORAL | 0 refills | Status: DC
  Filled 2020-09-23: qty 7, 7d supply, fill #0

## 2020-09-23 MED ORDER — HYDROMORPHONE HCL 1 MG/ML IJ SOLN
0.5000 mg | Freq: Once | INTRAMUSCULAR | Status: AC
  Administered 2020-09-23: 0.5 mg via INTRAVENOUS
  Filled 2020-09-23: qty 1

## 2020-09-23 MED ORDER — KETOROLAC TROMETHAMINE 15 MG/ML IJ SOLN
15.0000 mg | Freq: Once | INTRAMUSCULAR | Status: AC
  Administered 2020-09-23: 15 mg via INTRAVENOUS
  Filled 2020-09-23: qty 1

## 2020-09-23 MED ORDER — ONDANSETRON HCL 4 MG/2ML IJ SOLN
4.0000 mg | Freq: Once | INTRAMUSCULAR | Status: AC
  Administered 2020-09-23: 4 mg via INTRAVENOUS
  Filled 2020-09-23: qty 2

## 2020-09-23 MED ORDER — SODIUM CHLORIDE 0.9 % IV SOLN: Freq: Once | INTRAVENOUS | Status: AC

## 2020-09-23 MED ORDER — KETOROLAC TROMETHAMINE 10 MG PO TABS
10.0000 mg | ORAL_TABLET | Freq: Three times a day (TID) | ORAL | 0 refills | Status: DC | PRN
  Filled 2020-09-23: qty 10, 4d supply, fill #0

## 2020-09-23 MED ORDER — TAMSULOSIN HCL 0.4 MG PO CAPS: 0.4000 mg | ORAL_CAPSULE | Freq: Every day | ORAL | 0 refills | Status: DC

## 2020-09-23 MED ORDER — KETOROLAC TROMETHAMINE 10 MG PO TABS: 10.0000 mg | ORAL_TABLET | Freq: Three times a day (TID) | ORAL | 0 refills | Status: DC | PRN

## 2020-09-23 MED ORDER — TAMSULOSIN HCL 0.4 MG PO CAPS
0.4000 mg | ORAL_CAPSULE | Freq: Once | ORAL | Status: AC
  Administered 2020-09-23: 0.4 mg via ORAL
  Filled 2020-09-23: qty 1

## 2020-09-23 NOTE — ED Provider Notes (Signed)
Patient signed out to me is pending urinalysis.  This shows positive blood but no evidence of infection.  Patient given prescription of pain medicines, advised follow-up with his urologist on an outpatient basis this week.  Advised immediate return if he has worsening fevers pain or any additional concerns.   Luna Fuse, MD 09/23/20 [phone]

## 2020-09-23 NOTE — ED Provider Notes (Signed)
[facility] EMERGENCY DEPARTMENT Provider Note  CSN: [id number] Arrival date & time: 09/23/20 0359  Chief Complaint(s) Abdominal Pain  HPI Chris [name]. is a 80 y.o. male with a past medical history listed below including recent NSTEMI who presents to the emergency department with 12 hours of gradually worsening left flank/left lower quadrant abdominal pain described as a deep ache.  Pain has been constant since onset and gradually worsening.  Fluctuating in nature.  No alleviating or aggravating factors.  Patient reports no bowel movements for 2 days.  Still passing small amount of gas.  He is endorsing nausea with dry heaves.  No chest pain or shortness of breath.  No urinary symptoms.  HPI  Past Medical History Past Medical History:  Diagnosis Date  . Abnormal exercise myocardial perfusion study    03/2018- 38% ef, see report  . Arthritis   . Chronic kidney disease   . Congestive heart failure (CHF) ([place]) 5/17   EF 35-40% [name]  . Coronary artery disease    a. 1995 s/p CABG x 3 (VG->RCA, LIMA->LAD, VG->OM);  b. 07/2008 Inf MI/Cath/PCI: VG->RCA 99 - treated with Taxus DES (59mm), LIMA and VG->OM patent, LAD 100, LCX 100, RCA 60d, EF 50%;  c. 09/2008 PCI native RCA  w/ 2.5x23 Xience DES, VG->RCA stent patent;  c. 05/2010 Cath: Native 3VD with 3/3 patent grafts, native RCA w 80% ISR prox to graft insertion-->Med Rx.  . DOE (dyspnea on exertion)   . ED (erectile dysfunction)   . GERD (gastroesophageal reflux disease)    occ  . Gout   . H/O hiatal hernia   . Hyperlipidemia   . Hypertension   . Myocardial infarction ([place])   . Neuropathy    toes  . Obesity   . RBBB (right bundle branch block)   . Stenosis of right internal carotid artery    50-69% (2014)  . Type II diabetes mellitus ([place])   . Umbilical hernia    a. s/p repair.  . Vertigo    Patient Active Problem List   Diagnosis Date Noted  . Unstable angina ([place]) 09/04/2020  . Chronic combined  systolic and diastolic CHF (congestive heart failure) ([place])   . Coronary artery disease involving native coronary artery of native heart with unstable angina pectoris ([place]) 08/12/2020  . Abnormal exercise myocardial perfusion study   . Congestive heart failure (CHF) ([place])   . NSTEMI (non-ST elevated myocardial infarction) ([place]) 02/15/2014  . Type II diabetes mellitus ([place])   . Arthritis of left ankle 08/17/2013  . Stenosis of right internal carotid artery   . Precordial pain 06/03/2012  . TIA (transient ischemic attack) 10/02/2011  . Peripheral neuropathy 04/14/2011  . CAD (coronary artery disease) 11/04/2010  . Hypertension 11/04/2010  . Renal insufficiency 11/04/2010  . Obese 11/04/2010  . CVA (cerebral vascular accident) ([place]) 11/04/2010  . Hyperlipemia 11/04/2010   Home Medication(s) Prior to Admission medications   Medication Sig Start Date End Date Taking? Authorizing Provider  acetaminophen (TYLENOL) 500 MG tablet Take 500 mg by mouth every 6 (six) hours as needed for mild pain.   Yes [provider]  aspirin EC 81 MG tablet Take 1 tablet (81 mg total) by mouth daily. 02/16/14  Yes Adrian Prows, MD  clopidogrel (PLAVIX) 75 MG tablet TAKE 8 TABLETS (600 MG TOTAL) BY MOUTH ONCE FOR 1 DOSE TONIGHT THEN TAKE 1 TABLET DAILY Patient taking differently: Take 75 mg by mouth daily. 09/12/20 09/12/21 Yes  Adrian Prows, MD  metFORMIN (GLUCOPHAGE-XR) 500 MG 24 hr tablet Take 1,000 mg by mouth daily with breakfast.   Yes [provider]  metoprolol succinate (TOPROL-XL) 25 MG 24 hr tablet Take 1/2 tablet (12.5 mg total) by mouth daily. 09/12/20  Yes Adrian Prows, MD  nitroGLYCERIN (NITROSTAT) 0.4 MG SL tablet Place 0.4 mg under the tongue every 5 (five) minutes as needed for chest pain.   Yes [provider]  rosuvastatin (CRESTOR) 40 MG tablet Take 40 mg by mouth daily.   Yes [provider]  pantoprazole (PROTONIX) 40 MG tablet Take 1 tablet (40 mg total) by mouth  daily. Patient not taking: No sig reported 01/09/19   Susy Frizzle, MD                                                                                                                                    Past Surgical History Past Surgical History:  Procedure Laterality Date  . ANKLE ARTHROSCOPY WITH DRILLING/MICROFRACTURE Left 04/13/2013   Procedure: LEFT ANKLE ARTHROSCOPY WITH EXTENSIVE DEBRIEDMENT;  Surgeon: Wylene Simmer, MD;  Location: [place];  Service: Orthopedics;  Laterality: Left;  . ANKLE SURGERY Left 08/18/2013   DR HEWITT  . CARDIAC CATHETERIZATION  2000   stents x3  . CARDIAC CATHETERIZATION  02/15/2014   Procedure: LEFT HEART CATH AND CORS/GRAFTS ANGIOGRAPHY;  Surgeon: Laverda Page, MD;  Location: [facility] CATH LAB;  Service: Cardiovascular;;  . COLONOSCOPY    . CORONARY ARTERY BYPASS GRAFT  1995   LIMA to LAD, SVG to RCA, SVG to OM.   Marland Kitchen CORONARY STENT INTERVENTION N/A 08/12/2020   Procedure: CORONARY STENT INTERVENTION;  Surgeon: Nigel Mormon, MD;  Location: [place] CV LAB;  Service: Cardiovascular;  Laterality: N/A;  . LEFT HEART CATH AND CORS/GRAFTS ANGIOGRAPHY N/A 08/12/2020   Procedure: LEFT HEART CATH AND CORS/GRAFTS ANGIOGRAPHY;  Surgeon: Nigel Mormon, MD;  Location: [place] CV LAB;  Service: Cardiovascular;  Laterality: N/A;  . TOTAL ANKLE ARTHROPLASTY Left 08/17/2013   Procedure: LEFT TOTAL ANKLE REPLACEMENT WITH POSSIBLE GASTROC RECESSION ;  Surgeon: Wylene Simmer, MD;  Location: [place];  Service: Orthopedics;  Laterality: Left;  . UMBILICAL HERNIA REPAIR  12/2007   Family History Family History  Problem Relation Age of Onset  . Aneurysm Mother   . Heart disease Father   . Heart attack Father   . Stroke Brother     Social History Social History   Tobacco Use  . Smoking status: Former Smoker    Packs/day: 1.00    Years: 9.00    Pack years: 9.00    Types: Cigarettes    Quit date: 10/31/1970    Years since quitting: 49.9   . Smokeless tobacco: Former Systems developer    Types: Chew  . Tobacco comment: chewed for 2 years  Vaping Use  . Vaping Use: Never used  Substance Use Topics  .  Alcohol use: Not Currently    Comment: occ wine last 6 months  . Drug use: No   Allergies Brilinta [ticagrelor], Crestor [rosuvastatin calcium], and Lipitor [atorvastatin]  Review of Systems Review of Systems All other systems are reviewed and are negative for acute change except as noted in the HPI  Physical Exam Vital Signs  I have reviewed the triage vital signs BP 129/73   Pulse (!) 50   Resp 11   Ht 5\' 8"  (1.727 m)   SpO2 95%   BMI 35.00 kg/m   Physical Exam Vitals reviewed.  Constitutional:      General: He is not in acute distress.    Appearance: He is well-developed. He is not diaphoretic.  HENT:     Head: Normocephalic and atraumatic.     Jaw: No trismus.     Right Ear: External ear normal.     Left Ear: External ear normal.     Nose: Nose normal.  Eyes:     General: No scleral icterus.    Conjunctiva/sclera: Conjunctivae normal.  Neck:     Trachea: Phonation normal.  Cardiovascular:     Rate and Rhythm: Normal rate and regular rhythm.  Pulmonary:     Effort: Pulmonary effort is normal. No respiratory distress.     Breath sounds: No stridor.  Abdominal:     General: There is no distension.     Tenderness: There is abdominal tenderness in the left lower quadrant. There is left CVA tenderness. There is no guarding or rebound.  Musculoskeletal:        General: Normal range of motion.     Cervical back: Normal range of motion.  Neurological:     Mental Status: He is alert and oriented to person, place, and time.  Psychiatric:        Behavior: Behavior normal.     ED Results and Treatments Labs (all labs ordered are listed, but only abnormal results are displayed) Labs Reviewed  LIPASE, BLOOD - Abnormal; Notable for the following components:      Result Value   Lipase 118 (*)    All other  components within normal limits  COMPREHENSIVE METABOLIC PANEL - Abnormal; Notable for the following components:   Glucose, Bld 154 (*)    Creatinine, Ser 1.52 (*)    Albumin 3.4 (*)    Alkaline Phosphatase 30 (*)    GFR, Estimated 47 (*)    All other components within normal limits  CBC - Abnormal; Notable for the following components:   RBC 4.15 (*)    Hemoglobin 12.8 (*)    All other components within normal limits  URINALYSIS, ROUTINE W REFLEX MICROSCOPIC                                                                                                                         EKG  EKG Interpretation  Date/Time:  Monday Sep 23 2020 04:06:39 EDT Ventricular Rate:  56 PR Interval:  160  QRS Duration: 130 QT Interval:  504 QTC Calculation: 486 R Axis:   118 Text Interpretation: Sinus bradycardia Right bundle branch block Left posterior fascicular block ** Bifascicular block ** Inferior infarct , age undetermined Abnormal ECG No acute changes Confirmed by Addison Lank [phone]) on 09/23/2020 4:43:58 AM      Radiology CT Renal Stone Study  Result Date: 09/23/2020 CLINICAL DATA:  80 year old male with flank and left lower quadrant pain. STEMI last month. EXAM: CT ABDOMEN AND PELVIS WITHOUT CONTRAST TECHNIQUE: Multidetector CT imaging of the abdomen and pelvis was performed following the standard protocol without IV contrast. COMPARISON:  Chest CT 10/03/2019. Report of CT Abdomen and Pelvis 11/01/2000 (no images available). FINDINGS: Lower chest: Chronic interstitial lung disease appears stable at the lung bases with some associated bronchiectasis. No pericardial or pleural effusion. Moderate size paraesophageal type gastric hiatal hernia is stable. Hepatobiliary: Negative noncontrast liver and gallbladder. Pancreas: Negative. Spleen: Negative. Adrenals/Urinary Tract: Normal adrenal glands. Exophytic right renal midpole cyst anteriorly with simple fluid density appears benign. Right lower pole 5  mm calculus. Bilateral perinephric stranding is greater on the left, with only subtle left hydronephrosis but a 4 mm obstructing calculus is present at the left ureterovesical junction on series 5, image 46 and coronal image 52. Punctate superimposed left intrarenal calculus. Beyond the ureteral stone the left ureter is decompressed and negative to the bladder. The right ureter is decompressed and negative. Unremarkable urinary bladder. Incidental pelvic phleboliths. Stomach/Bowel: Negative rectum. Redundant sigmoid colon with moderate diverticulosis. No active inflammation. Negative descending colon. Redundant but otherwise negative transverse colon. Negative cecum with normal retrocecal appendix on series 5, image 62. Decompressed and negative terminal ileum. No dilated small bowel. There is a small volume of oral contrast in the distal small bowel. Negative intraabdominal stomach and duodenum. No free air, free fluid. Vascular/Lymphatic: Aortoiliac calcified atherosclerosis. Normal caliber abdominal aorta. Vascular patency is not evaluated in the absence of IV contrast. No lymphadenopathy. Reproductive: Negative. Other: No pelvic free fluid. Musculoskeletal: No acute osseous abnormality identified. Intermittent disc and endplate degeneration. IMPRESSION: 1. Acute obstructive uropathy on the left with a 4 mm UVJ calculus. Only mild left hydronephrosis but possible forniceal rupture. 2. Mild underlying bilateral nephrolithiasis. 5 mm right renal calculus. 3. No other acute or inflammatory process identified in the noncontrast abdomen and pelvis. Moderate paraesophageal gastric hernia. Sigmoid diverticulosis. Aortic Atherosclerosis (ICD10-I70.0). 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 Electronically Signed   By: Genevie Ann M.D.   On: 09/23/2020 06:06    Pertinent labs & imaging results that were available during my care of the patient were reviewed by me and considered in my medical decision making  (see chart for details).  Medications Ordered in ED Medications  tamsulosin (FLOMAX) capsule 0.4 mg (has no administration in time range)  0.9 %  sodium chloride infusion ( Intravenous New Bag/Given 09/23/20 0514)  ondansetron (ZOFRAN) injection 4 mg (4 mg Intravenous Given 09/23/20 0510)  HYDROmorphone (DILAUDID) injection 0.5 mg (0.5 mg Intravenous Given 09/23/20 0515)  HYDROmorphone (DILAUDID) injection 0.5 mg (0.5 mg Intravenous Given 09/23/20 0614)  ketorolac (TORADOL) 15 MG/ML injection 15 mg (15 mg Intravenous Given 09/23/20 0648)  Procedures Procedures  (including critical care time)  Medical Decision Making / ED Course I have reviewed the nursing notes for this encounter and the patient's prior records (if available in EHR or on provided paperwork).   Chris Reasons. was evaluated in Emergency Department on 09/23/2020 for the symptoms described in the history of present illness. He was evaluated in the context of the global COVID-19 pandemic, which necessitated consideration that the patient might be at risk for infection with the SARS-CoV-2 virus that causes COVID-19. Institutional protocols and algorithms that pertain to the evaluation of patients at risk for COVID-19 are in a state of rapid change based on information released by regulatory bodies including the CDC and federal and state organizations. These policies and algorithms were followed during the patient's care in the ED.    Clinical Course as of 09/23/20 0825  Mon Sep 23, 2020  0504 Will assess for SOB, renal stone, diverticulitis. [PC]    Clinical Course User Index [PC] Babetta Paterson, Grayce Sessions, MD   Work-up is consistent with an obstructed renal stone in the left ureter. Patient's renal function is intact. Treated symptomatically with couple doses of Dilaudid but pain improved after  Toradol.  Currently waiting urinalysis.  Patient care turned over to Dr Almyra Free. Patient case and results discussed in detail; please see their note for further ED managment.      Final Clinical Impression(s) / ED Diagnoses Final diagnoses:  Left ureteral stone      This chart was dictated using voice recognition software.  Despite best efforts to proofread,  errors can occur which can change the documentation meaning.   Fatima Blank, MD 09/23/20 [phone]

## 2020-09-23 NOTE — ED Notes (Signed)
Pt transported to CT at this time.

## 2020-09-23 NOTE — ED Triage Notes (Signed)
Per EMS, pt c/o LLQ pain.  Pt reports he "can't recall last bowel movement."  Pt had a STEMI last month w/ 2 stents placed.    Was given 200 mcg of fentanyl and 4mg  zofran by EMS 18G Right hand 148/88 58HR CBG 141

## 2020-09-23 NOTE — Discharge Instructions (Addendum)
Call your primary care doctor or specialist as discussed in the next 2-3 days.   Return immediately back to the ER if:  Your symptoms worsen within the next 12-24 hours. You develop new symptoms such as new fevers, persistent vomiting, new pain, shortness of breath, or new weakness or numbness, or if you have any other concerns.  

## 2020-09-23 NOTE — ED Notes (Signed)
Pt's O2 dropped slightly to 88% on RA, placed on 2L [place].  Will continue to monitor.

## 2020-09-26 ENCOUNTER — Other Ambulatory Visit: Payer: Self-pay | Admitting: Urology

## 2020-09-26 ENCOUNTER — Other Ambulatory Visit: Payer: Self-pay

## 2020-09-26 ENCOUNTER — Encounter: Payer: Self-pay | Admitting: Family Medicine

## 2020-09-26 ENCOUNTER — Ambulatory Visit (INDEPENDENT_AMBULATORY_CARE_PROVIDER_SITE_OTHER): Payer: Medicare HMO | Admitting: Family Medicine

## 2020-09-26 VITALS — BP 138/86 | HR 80 | Temp 98.3°F | Resp 16 | Ht 68.0 in | Wt 235.0 lb

## 2020-09-26 DIAGNOSIS — N201 Calculus of ureter: Secondary | ICD-10-CM | POA: Diagnosis not present

## 2020-09-26 MED ORDER — OXYCODONE-ACETAMINOPHEN 5-325 MG PO TABS: 1.0000 | ORAL_TABLET | ORAL | 0 refills | Status: DC | PRN

## 2020-09-26 MED ORDER — TAMSULOSIN HCL 0.4 MG PO CAPS: 0.4000 mg | ORAL_CAPSULE | Freq: Every day | ORAL | 0 refills | Status: DC

## 2020-09-26 NOTE — Patient Instructions (Signed)
[facility]- Dr. Nickolas Madrid [address] [place], [place]  10/01/2020- Tuesday  Go to Radiology @ 10am for X-Ray, then OV @11am 

## 2020-09-26 NOTE — Progress Notes (Signed)
Subjective:    Patient ID: Chris [name]., male    DOB: [birth year], 80 y.o.   MRN: [id number]  Patient recently went to the emergency room with left lower quadrant abdominal pain.  CT scan obtained in the emergency room revealed a 4 mm left UVJ stone and mild hydronephrosis.  There was also a 5 mm stone in the right renal pole that was asymptomatic.  He was started on Flomax and given Toradol.  However he is on dual antiplatelet inhibitor so I recommended he stop the Toradol and replace it with narcotics.  He is requesting a referral to a urologist.  He denies any fevers or indications for urgent stone extraction.  I explained to the patient that he will not be able to stop his dual antiplatelet therapy for 12 months.  Therefore they will not likely perform any instrumentation to remove the stone given that it should pass spontaneously on its own unless there is an urgent indication. Past Medical History:  Diagnosis Date  . Abnormal exercise myocardial perfusion study    03/2018- 38% ef, see report  . Arthritis   . Chronic kidney disease   . Congestive heart failure (CHF) ([place]) 5/17   EF 35-40% [name]  . Coronary artery disease    a. 1995 s/p CABG x 3 (VG->RCA, LIMA->LAD, VG->OM);  b. 07/2008 Inf MI/Cath/PCI: VG->RCA 99 - treated with Taxus DES (50mm), LIMA and VG->OM patent, LAD 100, LCX 100, RCA 60d, EF 50%;  c. 09/2008 PCI native RCA  w/ 2.5x23 Xience DES, VG->RCA stent patent;  c. 05/2010 Cath: Native 3VD with 3/3 patent grafts, native RCA w 80% ISR prox to graft insertion-->Med Rx.  . DOE (dyspnea on exertion)   . ED (erectile dysfunction)   . GERD (gastroesophageal reflux disease)    occ  . Gout   . H/O hiatal hernia   . Hyperlipidemia   . Hypertension   . Myocardial infarction ([place])   . Neuropathy    toes  . Obesity   . RBBB (right bundle branch block)   . Stenosis of right internal carotid artery    50-69% (2014)  . Type II diabetes mellitus ([place])   . Umbilical  hernia    a. s/p repair.  . Vertigo     Past Surgical History:  Procedure Laterality Date  . ANKLE ARTHROSCOPY WITH DRILLING/MICROFRACTURE Left 04/13/2013   Procedure: LEFT ANKLE ARTHROSCOPY WITH EXTENSIVE DEBRIEDMENT;  Surgeon: Wylene Simmer, MD;  Location: [place];  Service: Orthopedics;  Laterality: Left;  . ANKLE SURGERY Left 08/18/2013   DR HEWITT  . CARDIAC CATHETERIZATION  2000   stents x3  . CARDIAC CATHETERIZATION  02/15/2014   Procedure: LEFT HEART CATH AND CORS/GRAFTS ANGIOGRAPHY;  Surgeon: Laverda Page, MD;  Location: [facility] CATH LAB;  Service: Cardiovascular;;  . COLONOSCOPY    . CORONARY ARTERY BYPASS GRAFT  1995   LIMA to LAD, SVG to RCA, SVG to OM.   Marland Kitchen CORONARY STENT INTERVENTION N/A 08/12/2020   Procedure: CORONARY STENT INTERVENTION;  Surgeon: Nigel Mormon, MD;  Location: [place] CV LAB;  Service: Cardiovascular;  Laterality: N/A;  . LEFT HEART CATH AND CORS/GRAFTS ANGIOGRAPHY N/A 08/12/2020   Procedure: LEFT HEART CATH AND CORS/GRAFTS ANGIOGRAPHY;  Surgeon: Nigel Mormon, MD;  Location: [place] CV LAB;  Service: Cardiovascular;  Laterality: N/A;  . TOTAL ANKLE ARTHROPLASTY Left 08/17/2013   Procedure: LEFT TOTAL ANKLE REPLACEMENT WITH POSSIBLE GASTROC RECESSION ;  Surgeon:  Wylene Simmer, MD;  Location: [place];  Service: Orthopedics;  Laterality: Left;  . UMBILICAL HERNIA REPAIR  12/2007   Current Outpatient Medications on File Prior to Visit  Medication Sig Dispense Refill  . acetaminophen (TYLENOL) 500 MG tablet Take 500 mg by mouth every 6 (six) hours as needed for mild pain.    Marland Kitchen aspirin EC 81 MG tablet Take 1 tablet (81 mg total) by mouth daily.    . clopidogrel (PLAVIX) 75 MG tablet TAKE 8 TABLETS (600 MG TOTAL) BY MOUTH ONCE FOR 1 DOSE TONIGHT THEN TAKE 1 TABLET DAILY (Patient taking differently: Take 75 mg by mouth daily.) 38 tablet 0  . ketorolac (TORADOL) 10 MG tablet Take 1 tablet (10 mg total) by mouth every 8 (eight) hours  as needed for up to 10 doses. 10 tablet 0  . metFORMIN (GLUCOPHAGE-XR) 500 MG 24 hr tablet Take 1,000 mg by mouth daily with breakfast.    . metoprolol succinate (TOPROL-XL) 25 MG 24 hr tablet Take 1/2 tablet (12.5 mg total) by mouth daily. 30 tablet 1  . nitroGLYCERIN (NITROSTAT) 0.4 MG SL tablet Place 0.4 mg under the tongue every 5 (five) minutes as needed for chest pain.    . rosuvastatin (CRESTOR) 40 MG tablet Take 40 mg by mouth daily.     No current facility-administered medications on file prior to visit.   Allergies  Allergen Reactions  . Brilinta [Ticagrelor] Shortness Of Breath  . Crestor [Rosuvastatin Calcium] Other (See Comments)    Muscle pain- tolerating this 2022, however  . Lipitor [Atorvastatin] Other (See Comments)    Intolerable muscle pain all over    Social History   Socioeconomic History  . Marital status: Married    Spouse name: Not on file  . Number of children: 4  . Years of education: Not on file  . Highest education level: Not on file  Occupational History  . Not on file  Tobacco Use  . Smoking status: Former Smoker    Packs/day: 1.00    Years: 9.00    Pack years: 9.00    Types: Cigarettes    Quit date: 10/31/1970    Years since quitting: 49.9  . Smokeless tobacco: Former Systems developer    Types: Chew  . Tobacco comment: chewed for 2 years  Vaping Use  . Vaping Use: Never used  Substance and Sexual Activity  . Alcohol use: Not Currently    Comment: occ wine last 6 months  . Drug use: No  . Sexual activity: Yes  Other Topics Concern  . Not on file  Social History Narrative   Lives in [place] with wife.  Retired.   Social Determinants of Health   Financial Resource Strain: Low Risk   . Difficulty of Paying Living Expenses: Not very hard  Food Insecurity: Not on file  Transportation Needs: Not on file  Physical Activity: Not on file  Stress: Not on file  Social Connections: Not on file  Intimate Partner Violence: Not on file     Review  of Systems  All other systems reviewed and are negative.      Objective:   Physical Exam Vitals reviewed.  Constitutional:      General: He is not in acute distress.    Appearance: He is obese. He is not ill-appearing or toxic-appearing.  Cardiovascular:     Rate and Rhythm: Normal rate and regular rhythm.     Heart sounds: No murmur heard. No friction rub. No gallop.  Pulmonary:     Effort: Pulmonary effort is normal. No respiratory distress.     Breath sounds: No stridor. No wheezing or rhonchi.  Abdominal:     General: Abdomen is flat. Bowel sounds are normal. There is no distension.     Palpations: Abdomen is soft.     Tenderness: There is no abdominal tenderness. There is no guarding.  Musculoskeletal:     Right lower leg: No edema.     Left lower leg: No edema.  Neurological:     Mental Status: He is alert.           Assessment & Plan:  Left ureteral stone - Plan: Ambulatory referral to Urology  Continue Flomax 0.4 mg daily.  Use Percocet 5/325 1-2 p.o. every 6 hours as needed pain.  Discontinue Toradol given his history of dual antiplatelet therapy.  I do not believe the patient should stop his dual antiplatelet therapy given his recent non-STEMI.  However I will consult urology for any additional advice they may have.  Likely this time will have to pass on its own given his current medical comorbidities.

## 2020-09-27 ENCOUNTER — Ambulatory Visit: Payer: Medicare HMO | Admitting: Cardiology

## 2020-09-30 ENCOUNTER — Other Ambulatory Visit: Payer: Self-pay

## 2020-09-30 DIAGNOSIS — N201 Calculus of ureter: Secondary | ICD-10-CM

## 2020-10-01 ENCOUNTER — Ambulatory Visit
Admission: RE | Admit: 2020-10-01 | Discharge: 2020-10-01 | Disposition: A | Discharge status: Home / Self Care | Payer: Medicare HMO | Attending: Urology | Admitting: Urology

## 2020-10-01 ENCOUNTER — Encounter: Payer: Self-pay | Admitting: Urology

## 2020-10-01 ENCOUNTER — Other Ambulatory Visit: Payer: Self-pay

## 2020-10-01 ENCOUNTER — Other Ambulatory Visit
Admission: RE | Admit: 2020-10-01 | Discharge: 2020-10-01 | Disposition: A | Discharge status: Home / Self Care | Payer: Medicare HMO | Source: Home / Self Care | Attending: Urology | Admitting: Urology

## 2020-10-01 ENCOUNTER — Ambulatory Visit
Admission: RE | Admit: 2020-10-01 | Discharge: 2020-10-01 | Disposition: A | Discharge status: Home / Self Care | Payer: Medicare HMO | Source: Ambulatory Visit | Attending: Urology | Admitting: Urology

## 2020-10-01 ENCOUNTER — Ambulatory Visit (INDEPENDENT_AMBULATORY_CARE_PROVIDER_SITE_OTHER): Payer: Medicare HMO | Admitting: Urology

## 2020-10-01 VITALS — BP 140/80 | HR 78 | Ht 68.0 in | Wt 231.0 lb

## 2020-10-01 DIAGNOSIS — N200 Calculus of kidney: Secondary | ICD-10-CM | POA: Diagnosis not present

## 2020-10-01 DIAGNOSIS — N201 Calculus of ureter: Secondary | ICD-10-CM

## 2020-10-01 DIAGNOSIS — N2889 Other specified disorders of kidney and ureter: Secondary | ICD-10-CM | POA: Diagnosis not present

## 2020-10-01 LAB — URINALYSIS, COMPLETE (UACMP) WITH MICROSCOPIC
Bilirubin Urine: NEGATIVE
Glucose, UA: NEGATIVE mg/dL
Ketones, ur: NEGATIVE mg/dL
Leukocytes,Ua: NEGATIVE
Nitrite: NEGATIVE
Protein, ur: NEGATIVE mg/dL
Specific Gravity, Urine: 1.02 (ref 1.005–1.030)
pH: 5.5 (ref 5.0–8.0)

## 2020-10-01 NOTE — Patient Instructions (Signed)
Kidney Stones  Kidney stones are solid, rock-like deposits that form inside of the kidneys. The kidneys are a pair of organs that make urine. A kidney stone may form in a kidney and move into other parts of the urinary tract, including the tubes that connect the kidneys to the bladder (ureters), the bladder, and the tube that carries urine out of the body (urethra). As the stone moves through these areas, it can cause intense pain and block the flow of urine. Kidney stones are created when high levels of certain minerals are found in the urine. The stones are usually passed out of the body through urination, but in some cases, medical treatment may be needed to remove them. What are the causes? Kidney stones may be caused by:  A condition in which certain glands produce too much parathyroid hormone (primary hyperparathyroidism), which causes too much calcium buildup in the blood.  A buildup of uric acid crystals in the bladder (hyperuricosuria). Uric acid is a chemical that the body produces when you eat certain foods. It usually exits the body in the urine.  Narrowing (stricture) of one or both of the ureters.  A kidney blockage that is present at birth (congenital obstruction).  Past surgery on the kidney or the ureters, such as gastric bypass surgery. What increases the risk? The following factors may make you more likely to develop this condition:  Having had a kidney stone in the past.  Having a family history of kidney stones.  Not drinking enough water.  Eating a diet that is high in protein, salt (sodium), or sugar.  Being overweight or obese. What are the signs or symptoms? Symptoms of a kidney stone may include:  Pain in the side of the abdomen, right below the ribs (flank pain). Pain usually spreads (radiates) to the groin.  Needing to urinate frequently or urgently.  Painful urination.  Blood in the urine (hematuria).  Nausea.  Vomiting.  Fever and chills. How  is this diagnosed? This condition may be diagnosed based on:  Your symptoms and medical history.  A physical exam.  Blood tests.  Urine tests. These may be done before and after the stone passes out of your body through urination.  Imaging tests, such as a CT scan, abdominal X-ray, or ultrasound.  A procedure to examine the inside of the bladder (cystoscopy). How is this treated? Treatment for kidney stones depends on the size, location, and makeup of the stones. Kidney stones will often pass out of the body through urination. You may need to:  Increase your fluid intake to help pass the stone. In some cases, you may be given fluids through an IV and may need to be monitored at the hospital.  Take medicine for pain.  Make changes in your diet to help prevent kidney stones from coming back. Sometimes, medical procedures are needed to remove a kidney stone. This may involve:  A procedure to break up kidney stones using: ? A focused beam of light (laser therapy). ? Shock waves (extracorporeal shock wave lithotripsy).  Surgery to remove kidney stones. This may be needed if you have severe pain or have stones that block your urinary tract. Follow these instructions at home: Medicines  Take over-the-counter and prescription medicines only as told by your health care provider.  Ask your health care provider if the medicine prescribed to you requires you to avoid driving or using heavy machinery. Eating and drinking  Drink enough fluid to keep your urine pale yellow.   You may be instructed to drink at least 8-10 glasses of water each day. This will help you pass the kidney stone.  If directed, change your diet. This may include: ? Limiting how much sodium you eat. ? Eating more fruits and vegetables. ? Limiting how much animal protein--such as red meat, poultry, fish, and eggs--you eat.  Follow instructions from your health care provider about eating or drinking  restrictions. General instructions  Collect urine samples as told by your health care provider. You may need to collect a urine sample: ? 24 hours after you pass the stone. ? 8-12 weeks after passing the kidney stone, and every 6-12 months after that.  Strain your urine every time you urinate, for as long as directed. Use the strainer that your health care provider recommends.  Do not throw out the kidney stone after passing it. Keep the stone so it can be tested by your health care provider. Testing the makeup of your kidney stone may help prevent you from getting kidney stones in the future.  Keep all follow-up visits as told by your health care provider. This is important. You may need follow-up X-rays or ultrasounds to make sure that your stone has passed. How is this prevented? To prevent another kidney stone:  Drink enough fluid to keep your urine pale yellow. This is the best way to prevent kidney stones.  Eat a healthy diet and follow recommendations from your health care provider about foods to avoid. You may be instructed to eat a low-protein diet. Recommendations vary depending on the type of kidney stone that you have.  Maintain a healthy weight.   Where to find more information  National Kidney Foundation (NKF): www.kidney.org  [employer] (UCF): www.urologyhealth.org Contact a health care provider if:  You have pain that gets worse or does not get better with medicine. Get help right away if:  You have a fever or chills.  You develop severe pain.  You develop new abdominal pain.  You faint.  You are unable to urinate. Summary  Kidney stones are solid, rock-like deposits that form inside of the kidneys.  Kidney stones can cause nausea, vomiting, blood in the urine, abdominal pain, and the urge to urinate frequently.  Treatment for kidney stones depends on the size, location, and makeup of the stones. Kidney stones will often pass out of the body  through urination.  Kidney stones can be prevented by drinking enough fluids, eating a healthy diet, and maintaining a healthy weight. This information is not intended to replace advice given to you by your health care provider. Make sure you discuss any questions you have with your health care provider. Document Revised: 09/27/2018 Document Reviewed: 09/27/2018 Elsevier Patient Education  2021 Elsevier Inc.  

## 2020-10-01 NOTE — Progress Notes (Signed)
10/01/20 12:56 PM   Chris [name]. October 29, 1940 [id number]  CC: Left ureteral stone  HPI: I saw Chris [name] in urology clinic today for evaluation of a 4 mm left proximal ureteral stone.  He is a very comorbid 80 year old male with recent NSTEMI and percutaneous coronary intervention with multiple drug-eluting stent placement in March 2022 he remains on anticoagulation with Plavix.  He denies any prior hip episodes of kidney stones.  He presented to the ED on 09/23/2020 with left-sided flank pain and CT showing a 4 mm left proximal ureteral stone.  Urinalysis showed microscopic hematuria but was otherwise non-infected, creatinine was relatively stable at 1.5, and he was discharged on Flomax and with urology follow-up.  He reports he is overall been feeling well.  He denies any chest pain.  He has not had any flank pain over the last few days.  When he was previously having flank pain the Percocet was helping.  I encouraged him to avoid Toradol with his age and CKD.  Urinalysis today with 0-5 squamous cells, 0-5 RBCs, 6-10 WBCs, few bacteria, negative leukocytes, negative nitrate.   PMH: Past Medical History:  Diagnosis Date  . Abnormal exercise myocardial perfusion study    03/2018- 38% ef, see report  . Arthritis   . Chronic kidney disease   . Congestive heart failure (CHF) ([place]) 5/17   EF 35-40% [name]  . Coronary artery disease    a. 1995 s/p CABG x 3 (VG->RCA, LIMA->LAD, VG->OM);  b. 07/2008 Inf MI/Cath/PCI: VG->RCA 99 - treated with Taxus DES (6mm), LIMA and VG->OM patent, LAD 100, LCX 100, RCA 60d, EF 50%;  c. 09/2008 PCI native RCA  w/ 2.5x23 Xience DES, VG->RCA stent patent;  c. 05/2010 Cath: Native 3VD with 3/3 patent grafts, native RCA w 80% ISR prox to graft insertion-->Med Rx.  . DOE (dyspnea on exertion)   . ED (erectile dysfunction)   . GERD (gastroesophageal reflux disease)    occ  . Gout   . H/O hiatal hernia   . Hyperlipidemia   . Hypertension   . Myocardial  infarction ([place])   . Neuropathy    toes  . Obesity   . RBBB (right bundle branch block)   . Stenosis of right internal carotid artery    50-69% (2014)  . Type II diabetes mellitus ([place])   . Umbilical hernia    a. s/p repair.  . Vertigo     Surgical History: Past Surgical History:  Procedure Laterality Date  . ANKLE ARTHROSCOPY WITH DRILLING/MICROFRACTURE Left 04/13/2013   Procedure: LEFT ANKLE ARTHROSCOPY WITH EXTENSIVE DEBRIEDMENT;  Surgeon: Wylene Simmer, MD;  Location: [facility];  Service: Orthopedics;  Laterality: Left;  . ANKLE SURGERY Left 08/18/2013   DR HEWITT  . CARDIAC CATHETERIZATION  2000   stents x3  . CARDIAC CATHETERIZATION  02/15/2014   Procedure: LEFT HEART CATH AND CORS/GRAFTS ANGIOGRAPHY;  Surgeon: Laverda Page, MD;  Location: [facility] CATH LAB;  Service: Cardiovascular;;  . COLONOSCOPY    . CORONARY ARTERY BYPASS GRAFT  1995   LIMA to LAD, SVG to RCA, SVG to OM.   Marland Kitchen CORONARY STENT INTERVENTION N/A 08/12/2020   Procedure: CORONARY STENT INTERVENTION;  Surgeon: Nigel Mormon, MD;  Location: [facility] CV LAB;  Service: Cardiovascular;  Laterality: N/A;  . LEFT HEART CATH AND CORS/GRAFTS ANGIOGRAPHY N/A 08/12/2020   Procedure: LEFT HEART CATH AND CORS/GRAFTS ANGIOGRAPHY;  Surgeon: Nigel Mormon, MD;  Location: [facility] INVASIVE CV  LAB;  Service: Cardiovascular;  Laterality: N/A;  . TOTAL ANKLE ARTHROPLASTY Left 08/17/2013   Procedure: LEFT TOTAL ANKLE REPLACEMENT WITH POSSIBLE GASTROC RECESSION ;  Surgeon: Wylene Simmer, MD;  Location: [facility];  Service: Orthopedics;  Laterality: Left;  . UMBILICAL HERNIA REPAIR  12/2007   Family History: Family History  Problem Relation Age of Onset  . Aneurysm Mother   . Heart disease Father   . Heart attack Father   . Stroke Brother     Social History:  reports that he quit smoking about 49 years ago. His smoking use included cigarettes. He has a 9.00 pack-year smoking history. He has quit using smokeless  tobacco.  His smokeless tobacco use included chew. He reports previous alcohol use. He reports that he does not use drugs.  Physical Exam: BP 140/80   Pulse 78   Ht 5\' 8"  (1.727 m)   Wt 231 lb (104.8 kg)   BMI 35.12 kg/m    Constitutional:  Alert and oriented, No acute distress. Cardiovascular: No clubbing, cyanosis, or edema. Respiratory: Normal respiratory effort, no increased work of breathing. GI: Abdomen is soft, nontender, nondistended, no abdominal masses GU: No CVA tenderness  Laboratory Data: Reviewed, see HPI  Pertinent Imaging: I have personally viewed and interpreted the CT dated 09/23/2020 as well as the KUB today.  4 mm left proximal ureteral stone, and on KUB today appears to be in unchanged location proximally.  Assessment & Plan:   80 year old very comorbid male with recent cardiac event and cardiac stent placement on anticoagulation with Plavix who presents with a 4 mm left proximal ureteral stone.  Pain is currently very well controlled and he has no evidence of infection today.  On KUB today, stone appears to still be located in the left proximal ureter.  We discussed options at length including medical expulsive therapy or left ureteroscopy, laser lithotripsy, and stent placement.  He is not a candidate for shockwave lithotripsy with his need for anticoagulation this evening of recent cardiac stent placement.  He is currently very comfortable with no significant renal colic, no evidence of infection.  Pain has been controlled with pain medications.  I recommended continuing medical expulsive therapy with Flomax, and return precautions works discussed extensively.  With his recent cardiac event, I think a trial of medical expulsive therapy for at least a few weeks is reasonable as he continues to feel well.  We discussed return precautions extensively.  -Continue medical expulsive therapy for left 4 mm proximal ureteral stone with Flomax and pain meds as needed -RTC 2  weeks KUB and urinalysis symptom check, consider ureteroscopy at that time if stone still present  I spent 65 total minutes on the day of the encounter including pre-visit review of the medical record, face-to-face time with the patient, and post visit ordering of labs/imaging/tests.  Nickolas Madrid, MD 10/01/2020  [facility] [address] [place], [place] [address] [phone]

## 2020-10-15 ENCOUNTER — Other Ambulatory Visit
Admission: RE | Admit: 2020-10-15 | Discharge: 2020-10-15 | Disposition: A | Discharge status: Home / Self Care | Payer: Medicare HMO | Source: Home / Self Care | Attending: Urology | Admitting: Urology

## 2020-10-15 ENCOUNTER — Encounter: Payer: Self-pay | Admitting: Urology

## 2020-10-15 ENCOUNTER — Other Ambulatory Visit: Payer: Self-pay | Admitting: *Deleted

## 2020-10-15 ENCOUNTER — Ambulatory Visit (INDEPENDENT_AMBULATORY_CARE_PROVIDER_SITE_OTHER): Payer: Medicare HMO | Admitting: Urology

## 2020-10-15 ENCOUNTER — Other Ambulatory Visit: Payer: Self-pay

## 2020-10-15 ENCOUNTER — Telehealth: Payer: Self-pay | Admitting: Pharmacist

## 2020-10-15 ENCOUNTER — Ambulatory Visit: Admission: RE | Admit: 2020-10-15 | Payer: Medicare HMO | Source: Home / Self Care | Admitting: *Deleted

## 2020-10-15 ENCOUNTER — Ambulatory Visit
Admission: RE | Admit: 2020-10-15 | Discharge: 2020-10-15 | Disposition: A | Discharge status: Home / Self Care | Payer: Medicare HMO | Source: Ambulatory Visit | Attending: Urology | Admitting: Urology

## 2020-10-15 VITALS — BP 146/80 | HR 65 | Ht 68.0 in | Wt 232.0 lb

## 2020-10-15 DIAGNOSIS — N201 Calculus of ureter: Secondary | ICD-10-CM | POA: Diagnosis not present

## 2020-10-15 LAB — URINALYSIS, COMPLETE (UACMP) WITH MICROSCOPIC
Bacteria, UA: NONE SEEN
Bilirubin Urine: NEGATIVE
Glucose, UA: NEGATIVE mg/dL
Hgb urine dipstick: NEGATIVE
Leukocytes,Ua: NEGATIVE
Nitrite: NEGATIVE
Protein, ur: NEGATIVE mg/dL
Specific Gravity, Urine: 1.02 (ref 1.005–1.030)
pH: 5.5 (ref 5.0–8.0)

## 2020-10-15 NOTE — Progress Notes (Signed)
   10/15/2020 3:52 PM   Chris [name]. [birth year] [id number]  Reason for visit: Follow up left ureteral stone  HPI: Comorbid 80 year old male with recent NSTEMI and percutaneous coronary intervention with multiple drug-eluting stent placement in March 2022 who remains on anticoagulation with Plavix.  He presented to the ED on 09/23/2020 with left-sided flank pain and CT showed a 4 mm left proximal ureteral stone.  He was discharged with Flomax and urology follow-up.  When I saw him on 10/01/2020 the stone appeared to still be present on KUB, and he opted to continue medical expulsive therapy as his pain had improved significantly.    He passed his stone a few days later, and brought that with him with him to clinic today.  He denies any pain or gross hematuria.  Stone sent for analysis.Urinalysis today relatively benign with 6-10 WBCs and few bacteria.  We discussed general stone prevention strategies including adequate hydration with goal of producing 2.5 L of urine daily, increasing citric acid intake, increasing calcium intake during high oxalate meals, minimizing animal protein, and decreasing salt intake. Information about dietary recommendations given today.   Call with stone analysis results  [place], [place] [address] [place], [place] [address] [phone]

## 2020-10-15 NOTE — Patient Instructions (Signed)

## 2020-10-15 NOTE — Progress Notes (Addendum)
Chronic Care Management Pharmacy Assistant   Name: Chris [name].  MRN: [id number] DOB: 01-Aug-1940  Reason for Encounter: Disease State For HTN.    Conditions to be addressed/monitored:  CAD, HTN, CHF, Type II DM, HLD.  Recent office visits:  09/26/20 Dr. Dennard Schaumann. For left ureteral stone. STARTED oxycodone-acetaminophen 5-325 mg 1-2 tablets every 4 hours PRN. 08/30/20 Dr. Dennard Schaumann For follow-up. No medication changes. Per note: my medical advice is that he resume the aspirin and Plavix and Crestor immediately as I feel he would be at high risk for complications without them.  Recent consult visits:  10/01/20 U[facility] [place], MD. For left ureteral stone. No medication changes.  Hospital visits:  09/23/20 [facility] [place], Darl Householder, [place], MD. For left ureteral stone. EKG and CT preformed, labs preformed. 09/04/20 [facility] Pat[place]han, [place] J, MD. (20 Hours) For chest pain. STOPPED Entresto, Nitroglycerin,Pseudoephedirine, Triamcinolone cream, Vascepa. 08/12/20 [facility] Adrian Prows, MD. (29 Hours) For acute coronary syndrome. Per note: The patient is to ask his doctor about clopidogrel and rather he should continue to take it.   Medications: Outpatient Encounter Medications as of 10/15/2020  Medication Sig   acetaminophen (TYLENOL) 500 MG tablet Take 500 mg by mouth every 6 (six) hours as needed for mild pain.   aspirin EC 81 MG tablet Take 1 tablet (81 mg total) by mouth daily.   clopidogrel (PLAVIX) 75 MG tablet TAKE 8 TABLETS (600 MG TOTAL) BY MOUTH ONCE FOR 1 DOSE TONIGHT THEN TAKE 1 TABLET DAILY (Patient taking differently: Take 75 mg by mouth daily.)   ketorolac (TORADOL) 10 MG tablet Take 1 tablet (10 mg total) by mouth every 8 (eight) hours as needed for up to 10 doses.   metFORMIN (GLUCOPHAGE-XR) 500 MG 24 hr tablet Take 1,000 mg by mouth daily with breakfast.   metoprolol succinate (TOPROL-XL) 25 MG 24 hr tablet Take 1/2  tablet (12.5 mg total) by mouth daily.   nitroGLYCERIN (NITROSTAT) 0.4 MG SL tablet Place 0.4 mg under the tongue every 5 (five) minutes as needed for chest pain.   oxyCODONE-acetaminophen (PERCOCET) 5-325 MG tablet Take 1-2 tablets by mouth every 4 (four) hours as needed for severe pain.   rosuvastatin (CRESTOR) 40 MG tablet Take 40 mg by mouth daily.   No facility-administered encounter medications on file as of 10/15/2020.    Reviewed chart prior to disease state call. Spoke with patient regarding BP  Recent Office Vitals: BP Readings from Last 3 Encounters:  10/01/20 140/80  09/26/20 138/86  09/23/20 140/69   Pulse Readings from Last 3 Encounters:  10/01/20 78  09/26/20 80  09/23/20 (!) 51    Wt Readings from Last 3 Encounters:  10/01/20 231 lb (104.8 kg)  09/26/20 235 lb (106.6 kg)  09/04/20 230 lb 2.6 oz (104.4 kg)     Kidney Function Lab Results  Component Value Date/Time   CREATININE 1.52 (H) 09/23/2020 04:25 AM   CREATININE 1.39 (H) 09/05/2020 01:55 AM   CREATININE 1.23 (H) 08/30/2020 02:56 PM   CREATININE 1.44 (H) 08/01/2019 08:35 AM   GFRNONAA 47 (L) 09/23/2020 04:25 AM   GFRNONAA 56 (L) 08/30/2020 02:56 PM   GFRAA 65 08/30/2020 02:56 PM    BMP Latest Ref Rng & Units 09/23/2020 09/05/2020 09/04/2020  Glucose 70 - 99 mg/dL 154(H) 95 98  BUN 8 - 23 mg/dL 12 16 16   Creatinine 0.61 - 1.24 mg/dL 1.52(H) 1.39(H) 1.50(H)  BUN/Creat Ratio 6 -  22 (calc) - - -  Sodium 135 - 145 mmol/L 140 137 135  Potassium 3.5 - 5.1 mmol/L 4.0 3.9 4.4  Chloride 98 - 111 mmol/L 107 106 105  CO2 22 - 32 mmol/L 24 24 21(L)  Calcium 8.9 - 10.3 mg/dL 9.0 8.9 9.1    Current antihypertensive regimen:  Metoprolol succinate 25 mg 1/2 daily   How often are you checking your Blood Pressure? Patient state once a week daily   Current home BP readings: 137/74 09/30/20  What recent interventions/DTPs have been made by any provider to improve Blood Pressure control since last CPP Visit:  None.  Any recent hospitalizations or ED visits since last visit with CPP? Yes, documented above.  What diet changes have been made to improve Blood Pressure Control?  Patient stated he has not changed much on his diet.  What exercise is being done to improve your Blood Pressure Control?  Patient stated [place].   Adherence Review: Is the patient currently on ACE/ARB medication?  N/A  Does the patient have >5 day gap between last estimated fill dates? Per misc rtps, no.   Star Rating Drugs: Metformin 500 mg 90 DS 08/08/20, Rosuvastatin 40 mg 90 DS 07/05/20  Follow-Up:Pharmacist Review   Charlann Lange, RMA Clinical Pharmacist Assistant [phone] 10 minutes spent in review, coordination, and documentation.  Reviewed by: Beverly Milch, PharmD Clinical Pharmacist [facility] [phone]

## 2020-10-20 LAB — CALCULI, WITH PHOTOGRAPH (CLINICAL LAB)
Calcium Oxalate Dihydrate: 10 %
Calcium Oxalate Monohydrate: 90 %
Weight Calculi: 54 mg

## 2020-10-22 ENCOUNTER — Telehealth: Payer: Self-pay | Admitting: Pharmacist

## 2020-10-22 NOTE — Chronic Care Management (AMB) (Signed)
    Chronic Care Management  Name: Chris [name].  MRN: [id number] DOB: 07/29/1940  Reason for Encounter: Chart Review/Adherence Check   Recent office visits:  None since last Ccm call  Recent consult visits:  10/15/20 [facility], Urology) - ureteral stone prevention strategies discussed.  He is to minimize animal proteins, decrease salt intake, increase citric acid intake.  Hospital visits:  None in previous 6 months  Medications: Outpatient Encounter Medications as of 10/22/2020  Medication Sig  . acetaminophen (TYLENOL) 500 MG tablet Take 500 mg by mouth every 6 (six) hours as needed for mild pain.  Marland Kitchen aspirin EC 81 MG tablet Take 1 tablet (81 mg total) by mouth daily.  . clopidogrel (PLAVIX) 75 MG tablet TAKE 8 TABLETS (600 MG TOTAL) BY MOUTH ONCE FOR 1 DOSE TONIGHT THEN TAKE 1 TABLET DAILY (Patient taking differently: Take 75 mg by mouth daily.)  . ketorolac (TORADOL) 10 MG tablet Take 1 tablet (10 mg total) by mouth every 8 (eight) hours as needed for up to 10 doses.  . metFORMIN (GLUCOPHAGE-XR) 500 MG 24 hr tablet Take 1,000 mg by mouth daily with breakfast.  . metoprolol succinate (TOPROL-XL) 25 MG 24 hr tablet Take 1/2 tablet (12.5 mg total) by mouth daily.  . nitroGLYCERIN (NITROSTAT) 0.4 MG SL tablet Place 0.4 mg under the tongue every 5 (five) minutes as needed for chest pain.  Marland Kitchen oxyCODONE-acetaminophen (PERCOCET) 5-325 MG tablet Take 1-2 tablets by mouth every 4 (four) hours as needed for severe pain.  . rosuvastatin (CRESTOR) 40 MG tablet Take 40 mg by mouth daily.   No facility-administered encounter medications on file as of 10/22/2020.    Care Gaps: No care gaps were identified when reviewing Care Gap data.  Star Rating Drugs: Metformin 500 mg 90 DS 08/08/20 Rosuvastatin 40 mg 90 DS 07/05/20 Losartan 25mg  07/15/20 90ds - med has been d/c  Beverly Milch, PharmD Clinical Pharmacist [place] [phone]

## 2020-11-01 ENCOUNTER — Telehealth: Payer: Self-pay | Admitting: Pharmacist

## 2020-11-01 NOTE — Progress Notes (Addendum)
    Chronic Care Management Pharmacy Assistant   Name: Chris [name].  MRN: [id number] DOB: 1940/06/21  Reason for Encounter: Adherence Review   Conditions to be addressed/monitored: CAD, HTN, CHF, Type 2 DM, HLD.  Recent office visits:  None since 10/22/20  Recent consult visits:  None since 10/22/20  Hospital visits:  None since 10/22/20  Medications: Outpatient Encounter Medications as of 11/01/2020  Medication Sig   acetaminophen (TYLENOL) 500 MG tablet Take 500 mg by mouth every 6 (six) hours as needed for mild pain.   aspirin EC 81 MG tablet Take 1 tablet (81 mg total) by mouth daily.   clopidogrel (PLAVIX) 75 MG tablet TAKE 8 TABLETS (600 MG TOTAL) BY MOUTH ONCE FOR 1 DOSE TONIGHT THEN TAKE 1 TABLET DAILY (Patient taking differently: Take 75 mg by mouth daily.)   ketorolac (TORADOL) 10 MG tablet Take 1 tablet (10 mg total) by mouth every 8 (eight) hours as needed for up to 10 doses.   metFORMIN (GLUCOPHAGE-XR) 500 MG 24 hr tablet Take 1,000 mg by mouth daily with breakfast.   metoprolol succinate (TOPROL-XL) 25 MG 24 hr tablet Take 1/2 tablet (12.5 mg total) by mouth daily.   nitroGLYCERIN (NITROSTAT) 0.4 MG SL tablet Place 0.4 mg under the tongue every 5 (five) minutes as needed for chest pain.   oxyCODONE-acetaminophen (PERCOCET) 5-325 MG tablet Take 1-2 tablets by mouth every 4 (four) hours as needed for severe pain.   rosuvastatin (CRESTOR) 40 MG tablet Take 40 mg by mouth daily.   No facility-administered encounter medications on file as of 11/01/2020.   Patient stated he is up to date on his medications refills. He stated he does not forget to take his medications. He was able to inform me of his medications and how he is supposed to take them. He stated he's been out doors since the weather has improved. He stated he ensure she drinks a lot of a water and eats three meals daily.  Star Rating Drugs: Metformin 500 mg 90 DS 08/08/20  Rosuvastatin 40 mg 90 DS  07/05/20  Follow-Up:Pharmacist Review  Charlann Lange, [place] Clinical Pharmacist Assistant [phone]

## 2020-11-05 ENCOUNTER — Other Ambulatory Visit: Payer: Self-pay | Admitting: Family Medicine

## 2020-11-06 ENCOUNTER — Other Ambulatory Visit: Payer: Self-pay

## 2020-11-06 ENCOUNTER — Other Ambulatory Visit: Payer: Self-pay | Admitting: Family Medicine

## 2020-11-06 ENCOUNTER — Ambulatory Visit: Payer: Medicare HMO

## 2020-11-06 DIAGNOSIS — R0609 Other forms of dyspnea: Secondary | ICD-10-CM

## 2020-11-06 DIAGNOSIS — I5042 Chronic combined systolic (congestive) and diastolic (congestive) heart failure: Secondary | ICD-10-CM

## 2020-11-06 DIAGNOSIS — I6521 Occlusion and stenosis of right carotid artery: Secondary | ICD-10-CM | POA: Diagnosis not present

## 2020-11-13 ENCOUNTER — Other Ambulatory Visit: Payer: Self-pay

## 2020-11-13 ENCOUNTER — Ambulatory Visit: Payer: Medicare HMO | Admitting: Cardiology

## 2020-11-13 ENCOUNTER — Encounter: Payer: Self-pay | Admitting: Cardiology

## 2020-11-13 VITALS — BP 121/70 | HR 87 | Temp 97.9°F | Resp 16 | Ht 68.0 in | Wt 231.0 lb

## 2020-11-13 DIAGNOSIS — E1122 Type 2 diabetes mellitus with diabetic chronic kidney disease: Secondary | ICD-10-CM

## 2020-11-13 DIAGNOSIS — I25110 Atherosclerotic heart disease of native coronary artery with unstable angina pectoris: Secondary | ICD-10-CM | POA: Diagnosis not present

## 2020-11-13 DIAGNOSIS — I25118 Atherosclerotic heart disease of native coronary artery with other forms of angina pectoris: Secondary | ICD-10-CM

## 2020-11-13 DIAGNOSIS — I6523 Occlusion and stenosis of bilateral carotid arteries: Secondary | ICD-10-CM

## 2020-11-13 DIAGNOSIS — I5042 Chronic combined systolic (congestive) and diastolic (congestive) heart failure: Secondary | ICD-10-CM | POA: Diagnosis not present

## 2020-11-13 DIAGNOSIS — R0609 Other forms of dyspnea: Secondary | ICD-10-CM | POA: Diagnosis not present

## 2020-11-13 DIAGNOSIS — N1832 Chronic kidney disease, stage 3b: Secondary | ICD-10-CM | POA: Diagnosis not present

## 2020-11-13 DIAGNOSIS — Z6835 Body mass index (BMI) 35.0-35.9, adult: Secondary | ICD-10-CM | POA: Diagnosis not present

## 2020-11-13 MED ORDER — VALSARTAN 80 MG PO TABS: 80.0000 mg | ORAL_TABLET | Freq: Every evening | ORAL | 2 refills | Status: DC

## 2020-11-13 NOTE — Progress Notes (Signed)
Primary Physician/Referring:  Susy Frizzle, MD  Patient ID: Chris Reasons., male    DOB: 1940/06/26, 80 y.o.   MRN: [id number]  Chief Complaint  Patient presents with   Congestive Heart Failure   Coronary Artery Disease   Carotid Stenosis   Chest Pain   Follow-up    6 months    HPI:    Chris Putt.  is a 80 y.o. Caucasian male patient with hypertension, hyperlipidemia, diabetes mellitus diagnosed in 2018, CAD,hx of MI with CABG S/P multiple coronary interventions,  H/O stroke in past without residual deficits, chronic systolic and diastolic CHF with EF 76-19% and asymptomatic carotid artery stenosis, IPF presents here for a 78-month office visit.  Since last seen in March 2021 office, he underwent successful PCI to high-grade stenosis of the SVG to RCA.  He has had 1 episode of angina last night for which she had to take 2 sublingual nitroglycerin with relief of chest pain.  Otherwise dyspnea remained stable, no PND or orthopnea, no leg edema. Past Medical History:  Diagnosis Date   Abnormal exercise myocardial perfusion study    03/2018- 38% ef, see report   Arthritis    Chronic kidney disease    Congestive heart failure (CHF) ([place]) 5/17   EF 35-40% [name]   Coronary artery disease    a. 1995 s/p CABG x 3 (VG->RCA, LIMA->LAD, VG->OM);  b. 07/2008 Inf MI/Cath/PCI: VG->RCA 99 - treated with Taxus DES (18mm), LIMA and VG->OM patent, LAD 100, LCX 100, RCA 60d, EF 50%;  c. 09/2008 PCI native RCA  w/ 2.5x23 Xience DES, VG->RCA stent patent;  c. 05/2010 Cath: Native 3VD with 3/3 patent grafts, native RCA w 80% ISR prox to graft insertion-->Med Rx.   DOE (dyspnea on exertion)    ED (erectile dysfunction)    GERD (gastroesophageal reflux disease)    occ   Gout    H/O hiatal hernia    Hyperlipidemia    Hypertension    Myocardial infarction ([place])    Neuropathy    toes   Obesity    RBBB (right bundle branch block)    Stenosis of right internal carotid artery     50-69% (2014)   Type II diabetes mellitus ([place])    Umbilical hernia    a. s/p repair.   Vertigo    Past Surgical History:  Procedure Laterality Date   ANKLE ARTHROSCOPY WITH DRILLING/MICROFRACTURE Left 04/13/2013   Procedure: LEFT ANKLE ARTHROSCOPY WITH EXTENSIVE DEBRIEDMENT;  Surgeon: Wylene Simmer, MD;  Location: [facility];  Service: Orthopedics;  Laterality: Left;   ANKLE SURGERY Left 08/18/2013   DR HEWITT   CARDIAC CATHETERIZATION  2000   stents x3   CARDIAC CATHETERIZATION  02/15/2014   Procedure: LEFT HEART CATH AND CORS/GRAFTS ANGIOGRAPHY;  Surgeon: Laverda Page, MD;  Location: [facility] CATH LAB;  Service: Cardiovascular;;   COLONOSCOPY     CORONARY ARTERY BYPASS GRAFT  1995   LIMA to LAD, SVG to RCA, SVG to OM.    CORONARY STENT INTERVENTION N/A 08/12/2020   Procedure: CORONARY STENT INTERVENTION;  Surgeon: Nigel Mormon, MD;  Location: [facility] CV LAB;  Service: Cardiovascular;  Laterality: N/A;   LEFT HEART CATH AND CORS/GRAFTS ANGIOGRAPHY N/A 08/12/2020   Procedure: LEFT HEART CATH AND CORS/GRAFTS ANGIOGRAPHY;  Surgeon: Nigel Mormon, MD;  Location: [facility] CV LAB;  Service: Cardiovascular;  Laterality: N/A;   TOTAL ANKLE ARTHROPLASTY Left 08/17/2013   Procedure:  LEFT TOTAL ANKLE REPLACEMENT WITH POSSIBLE GASTROC RECESSION ;  Surgeon: Wylene Simmer, MD;  Location: [facility];  Service: Orthopedics;  Laterality: Left;   UMBILICAL HERNIA REPAIR  12/2007   Family History  Problem Relation Age of Onset   Aneurysm Mother    Heart disease Father    Heart attack Father    Stroke Brother     Social History   Tobacco Use   Smoking status: Former    Packs/day: 1.00    Years: 9.00    Pack years: 9.00    Types: Cigarettes    Quit date: 10/31/1970    Years since quitting: 50.0   Smokeless tobacco: Former    Types: Chew   Tobacco comments:    chewed for 2 years  Substance Use Topics   Alcohol use: Not Currently    Comment: occ wine last 6 months    ROS  Review of Systems  Cardiovascular:  Positive for chest pain and dyspnea on exertion. Negative for leg swelling.  Musculoskeletal:  Positive for arthritis.  Gastrointestinal:  Negative for melena.  Objective  Blood pressure 121/70, pulse 87, temperature 97.9 F (36.6 C), temperature source Temporal, resp. rate 16, height 5\' 8"  (1.727 m), weight 231 lb (104.8 kg), SpO2 96 %.  Vitals with BMI 11/13/2020 10/15/2020 10/01/2020  Height 5\' 8"  5\' 8"  5\' 8"   Weight 231 lbs 232 lbs 231 lbs  BMI 35.13 58.85 02.77  Systolic 412 878 676  Diastolic 70 80 80  Pulse 87 65 78     Physical Exam Constitutional:      General: He is not in acute distress.    Appearance: He is well-developed.  Neck:     Vascular: No JVD.  Cardiovascular:     Rate and Rhythm: Normal rate and regular rhythm.     Pulses:          Carotid pulses are 2+ on the right side and 2+ on the left side.    Heart sounds: Normal heart sounds. No murmur heard.   No gallop.     Comments: No JVD, No leg edema.  Fem and Pop pulse difficult to feel due to body habitus.  DP  Normal and PT absent bilateral Pulmonary:     Effort: Pulmonary effort is normal.     Breath sounds: Rales (left worse than right coarse crackles) present.  Abdominal:     General: Bowel sounds are normal.     Palpations: Abdomen is soft.  Musculoskeletal:        General: Normal range of motion.  Skin:    General: Skin is warm and dry.  Neurological:     General: No focal deficit present.     Mental Status: He is oriented to person, place, and time.   Laboratory examination:   Recent Labs    05/15/20 1000 08/12/20 1253 08/30/20 1456 09/04/20 1603 09/05/20 0155 09/23/20 0425  NA 141   < > 139 135 137 140  K 4.7   < > 4.6 4.4 3.9 4.0  CL 104   < > 104 105 106 107  CO2 21   < > 25 21* 24 24  GLUCOSE 134*   < > 92 98 95 154*  BUN 13   < > 16 16 16 12   CREATININE 1.24   < > 1.23* 1.50* 1.39* 1.52*  CALCIUM 9.6   < > 9.8 9.1 8.9 9.0  GFRNONAA  56*   < > 56* 48* 52* 47*  GFRAA 64  --  65  --   --   --    < > = values in this interval not displayed.   CrCl cannot be calculated (Patient's most recent lab result is older than the maximum 21 days allowed.).  CMP Latest Ref Rng & Units 09/23/2020 09/05/2020 09/04/2020  Glucose 70 - 99 mg/dL 154(H) 95 98  BUN 8 - 23 mg/dL 12 16 16   Creatinine 0.61 - 1.24 mg/dL 1.52(H) 1.39(H) 1.50(H)  Sodium 135 - 145 mmol/L 140 137 135  Potassium 3.5 - 5.1 mmol/L 4.0 3.9 4.4  Chloride 98 - 111 mmol/L 107 106 105  CO2 22 - 32 mmol/L 24 24 21(L)  Calcium 8.9 - 10.3 mg/dL 9.0 8.9 9.1  Total Protein 6.5 - 8.1 g/dL 6.9 - -  Total Bilirubin 0.3 - 1.2 mg/dL 0.6 - -  Alkaline Phos 38 - 126 U/L 30(L) - -  AST 15 - 41 U/L 24 - -  ALT 0 - 44 U/L 16 - -   CBC Latest Ref Rng & Units 09/23/2020 09/04/2020 08/30/2020  WBC 4.0 - 10.5 K/uL 10.2 8.4 7.5  Hemoglobin 13.0 - 17.0 g/dL 12.8(L) 13.2 13.8  Hematocrit 39.0 - 52.0 % 39.1 40.3 40.7  Platelets 150 - 400 K/uL 228 230 246   Lipid Panel Recent Labs    05/15/20 1000 08/12/20 1253 08/13/20 0306  CHOL 183 126 110  TRIG 344* 220* 230*  LDLCALC 87 40 29  VLDL  --  44* 46*  HDL 40 42 35*  CHOLHDL  --  3.0 3.1    HEMOGLOBIN A1C Lab Results  Component Value Date   HGBA1C 6.4 (H) 08/12/2020   MPG 136.98 08/12/2020   TSH No results for input(s): TSH in the last 8760 hours.  Medications and allergies   Allergies  Allergen Reactions   Brilinta [Ticagrelor] Shortness Of Breath   Crestor [Rosuvastatin Calcium] Other (See Comments)    Muscle pain- tolerating this 2022, however   Lipitor [Atorvastatin] Other (See Comments)    Intolerable muscle pain all over     Current Outpatient Medications on File Prior to Visit  Medication Sig Dispense Refill   acetaminophen (TYLENOL) 500 MG tablet Take 500 mg by mouth every 6 (six) hours as needed for mild pain.     aspirin EC 81 MG tablet Take 1 tablet (81 mg total) by mouth daily.     clopidogrel (PLAVIX) 75 MG  tablet TAKE 8 TABLETS (600 MG TOTAL) BY MOUTH ONCE FOR 1 DOSE TONIGHT THEN TAKE 1 TABLET DAILY (Patient taking differently: Take 75 mg by mouth daily.) 38 tablet 0   metFORMIN (GLUCOPHAGE-XR) 500 MG 24 hr tablet Take 1,000 mg by mouth daily with breakfast.     metoprolol succinate (TOPROL-XL) 25 MG 24 hr tablet Take 1/2 tablet (12.5 mg total) by mouth daily. 30 tablet 1   nitroGLYCERIN (NITROSTAT) 0.4 MG SL tablet Place 0.4 mg under the tongue every 5 (five) minutes as needed for chest pain.     rosuvastatin (CRESTOR) 40 MG tablet Take 40 mg by mouth daily.     No current facility-administered medications on file prior to visit.    Radiology:   CT angio chest 07/25/2019: 1. No evidence of pulmonary embolism. 2. Scattered ground-glass density throughout both lungs with increased central and subpleural reticulation and areas of mosaic attenuation. No frank honeycombing or significant bronchiectasis. Differential considerations include chronic hypersensitivity pneumonitis or idiopathic interstitial pneumonia such as NSIP. Pulmonary consultation is suggested,  as well as high-resolution chest CT follow-up in 6 months. 3. 5 mm pulmonary nodule along the right major fissure. No follow-up needed if patient is low-risk. Non-contrast chest CT can be considered in 12 months if patient is high-risk.  4.  Aortic atherosclerosis (ICD10-I70.0).  CT scan of the chest 10/04/2019:  1. 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 Findings are consistent with UIP per consensus guidelines. 2. Borderline mild cardiomegaly. 3. Moderate hiatal hernia. 4. Aortic Atherosclerosis    Cardiac Studies:   Lexiscan myoview stress test 04/11/2018: 1. Lexiscan stress test was performed. Exercise capacity was not assessed. No stress symptoms reported. Blood pressure was normal.  The resting and stress electrocardiogram demonstrated  normal sinus rhythm, normal resting conduction, no resting arrhythmias, old inferior and posterior infarct, normal rest repolarization.  Stress EKG is non diagnostic for ischemia as it is a pharmacologic stress. 2. The overall quality of the study is good.  Left ventricular cavity is noted to be normal on the rest and stress studies.  Gated SPECT imaging demonstrates hypokinesis of the basal inferior, basal inferolateral, mid inferior, mid inferolateral, apical inferior and apical lateral myocardial wall(s).  The left ventricular ejection fraction was calculated or visually estimated to be 38%. LSPECT images reveal a large sized, medium intensity, minimally reversible perfusion defect suggestive of large infarct with minimal peri infarct ischemia in LCx/PDA territory.   3. High risk study.  Overnight oximetry test 10/11/2019  Desaturation below 88% 17.6 minutes.  Left heart catheterization 08/12/2020: LM: Normal LAD: 100% occlusion after D1 LCx: 100% prox occlusion RCA: 100% mid occlusion with ISR LIMA-LAD: Patent SVG-OM3: Patent SVG-RCA: Patent prox-mid stent with 40% lumen loss ( 09/2008 PCI with 38 mm Taxus DES)                90% and 80% lesions in distal part of SVG-RCA                90% lesion at SVG-to RCA anastomosis   LVEF 40-45%. LVEDP 18 mmHg   Successful stenting of SVG to RCA, distal segment with 3.5 x 38 mm and anastamosis with 2.5 x 12 mm resolute Onyx DES.  Echocardiogram 11/06/2020:   Left ventricle cavity is normal in size. Moderate concentric hypertrophy of the left ventricle.  Left ventricle regional wall motion findings: Basal inferolateral, Basal inferior, Mid inferolateral and Mid inferior akinesis. Moderate decrease in global wall motion. Visual EF is 35-40%. Doppler evidence of grade II (pseudonormal) diastolic dysfunction, elevated LAP. Left atrial cavity is moderately dilated by volume. Right ventricle cavity is mildly dilated. Normal right ventricular  function. Compared to 08/13/2020, no significant change.  Carotid artery duplex 11/06/2020: Doppler velocity suggests stenosis in the right internal carotid artery (1-15%). Doppler velocity suggests stenosis in the left internal carotid artery (16-49%). Antegrade right vertebral artery flow. Antegrade left vertebral artery flow. Compared to 04/24/2020, right ICA stenosis was 50-69% stenosis. Follow up in one year is appropriate if clinically indicated.    EKG  EKG 11/13/2020: Normal sinus rhythm at rate of 64 beats minute, left atrial enlargement, right axis deviation, cannot exclude inferior infarct old.  Right bundle branch block.  No significant change from 08/12/2020. [place] pulmonary disease pattern.    Assessment   1. Coronary artery disease involving native coronary artery of native heart with unstable angina pectoris ([place])   2. Chronic combined systolic and diastolic heart failure ([place])   3. Dyspnea on exertion  4. Asymptomatic bilateral carotid artery stenosis   5. Type 2 diabetes mellitus with stage 3b chronic kidney disease, without long-term current use of insulin ([place])     No orders of the defined types were placed in this encounter.  Medications Discontinued During This Encounter  Medication Reason   oxyCODONE-acetaminophen (PERCOCET) 5-325 MG tablet Error   ketorolac (TORADOL) 10 MG tablet Error   tamsulosin (FLOMAX) 0.4 MG CAPS capsule Error    Orders Placed This Encounter  Procedures   EKG 12-Lead     Recommendations:   Chris [name].  is a 80 y.o. Caucasian male patient with hypertension, hyperlipidemia, diabetes mellitus diagnosed in 2018, CAD,hx of MI with CABG S/P multiple coronary interventions,  H/O stroke in past without residual deficits, chronic systolic and diastolic CHF with EF 36-06% and asymptomatic carotid artery stenosis.  He had presented with unstable anginal-like symptoms to our office in March 2022, underwent successful angioplasty to  saphenous vein graft to the right coronary artery.  He has had 1 more episode of angina last night.  Discussed with him regarding unstable anginal symptoms and when to seek help.  He will continue to use nitroglycerin on a as needed basis.  With regard to chronic systolic and diastolic heart failure, I recommended that he start valsartan or even potentially consider Entresto, patient does not want to take any new medications.  Hence the order was canceled.  He does have stage III chronic kidney disease, would like to obtain another BMP and also due to shortness of breath we will obtain a BNP.  He has pulmonary fibrosis, has follow-up appointments with pulmonary medicine as well, encouraged him to keep that appointment.  His lipids are uncontrolled, triglyceride elevation is related to his poor diet.  Patient again does not plan to make changes to his medications.  Weight loss also discussed with the patient.  His dyspnea is multifactorial including heart failure, coronary artery disease, obesity and IPF contributing.   Chris Prows, PA-C 11/13/2020, 11:33 AM Office: [phone]

## 2020-11-13 NOTE — Progress Notes (Signed)
Echocardiogram 11/06/2020:   Left ventricle cavity is normal in size. Moderate concentric hypertrophy of the left ventricle.  Left ventricle regional wall motion findings: Basal inferolateral, Basal inferior, Mid inferolateral and Mid inferior akinesis. Moderate decrease in global wall motion. Visual EF is 35-40%. Doppler evidence of grade II (pseudonormal) diastolic dysfunction, elevated LAP. Left atrial cavity is moderately dilated by volume. Right ventricle cavity is mildly dilated. Normal right ventricular function. Compared to 08/09/2019, no significant change.  Will discuss on OV today

## 2020-11-19 ENCOUNTER — Ambulatory Visit: Payer: Medicare HMO | Admitting: Pulmonary Disease

## 2020-11-19 ENCOUNTER — Other Ambulatory Visit: Payer: Self-pay

## 2020-11-19 ENCOUNTER — Encounter: Payer: Self-pay | Admitting: Pulmonary Disease

## 2020-11-19 VITALS — BP 120/68 | HR 55 | Ht 68.0 in | Wt 230.2 lb

## 2020-11-19 DIAGNOSIS — J849 Interstitial pulmonary disease, unspecified: Secondary | ICD-10-CM

## 2020-11-19 NOTE — Progress Notes (Signed)
Chris [name]    [id number]    07-Oct-1940  Primary Care Physician:[name], [name], MD  Referring Physician: Susy Frizzle, MD [address] [place],  [place] [address]  Chief complaint: Consult for pulmonary fibrosis  HPI: 80 year old with CKD, CHF, CAD Evaluated in 2021 by Dr. 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  IPF was suspected and he was supposed to get ILD panel for further work-up but did not follow through.  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 his cardiologist to reestablish care. Complains of chronic dyspnea on exertion over the past 2 years, occasional cough with mucus production.  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  No feather pillows or comforter Smoking history: Quit from high school to age of early 89s Travel history: No significant travel history Relevant family history: Wife has RA ILD and is on [place].  Follows with Dr. Chase Caller  Outpatient Encounter Medications as of 11/19/2020  Medication Sig   acetaminophen (TYLENOL) 500 MG tablet Take 500 mg by mouth every 6 (six) hours as needed for mild pain.   aspirin EC 81 MG tablet Take 1 tablet (81 mg total) by mouth daily.   clopidogrel (PLAVIX) 75 MG tablet TAKE 8 TABLETS (600 MG TOTAL) BY MOUTH ONCE FOR 1 DOSE TONIGHT THEN TAKE 1 TABLET DAILY (Patient taking differently: Take 75 mg by mouth daily.)   metFORMIN (GLUCOPHAGE-XR) 500 MG 24 hr tablet Take 1,000 mg by mouth daily with breakfast.   metoprolol succinate (TOPROL-XL) 25 MG 24 hr tablet Take 1/2 tablet (12.5 mg total) by mouth daily.   nitroGLYCERIN (NITROSTAT) 0.4 MG SL tablet Place 0.4 mg under the tongue every 5 (five) minutes as needed for chest pain.   rosuvastatin (CRESTOR) 40 MG  tablet Take 40 mg by mouth daily.   tamsulosin (FLOMAX) 0.4 MG CAPS capsule Take 0.4 mg by mouth.   No facility-administered encounter medications on file as of 11/19/2020.    Allergies as of 11/19/2020 - Review Complete 11/19/2020  Allergen Reaction Noted   Brilinta [ticagrelor] Shortness Of Breath 06/09/2018   Crestor [rosuvastatin calcium] Other (See Comments) 06/09/2018   Lipitor [atorvastatin] Other (See Comments) 12/12/2013    Past Medical History:  Diagnosis Date   Abnormal exercise myocardial perfusion study    03/2018- 38% ef, see report   Arthritis    Chronic kidney disease    Congestive heart failure (CHF) ([place]) 5/17   EF 35-40% [name]   Coronary artery disease    a. 1995 s/p CABG x 3 (VG->RCA, LIMA->LAD, VG->OM);  b. 07/2008 Inf MI/Cath/PCI: VG->RCA 99 - treated with Taxus DES (25mm), LIMA and VG->OM patent, LAD 100, LCX 100, RCA 60d, EF 50%;  c. 09/2008 PCI native RCA  w/ 2.5x23 Xience DES, VG->RCA stent patent;  c. 05/2010 Cath: Native 3VD with 3/3 patent grafts, native RCA w 80% ISR prox to graft insertion-->Med Rx.   DOE (dyspnea on exertion)    ED (erectile dysfunction)    GERD (gastroesophageal reflux disease)    occ   Gout    H/O hiatal hernia    Hyperlipidemia    Hypertension    Myocardial infarction ([place])    Neuropathy  toes   Obesity    RBBB (right bundle branch block)    Stenosis of right internal carotid artery    50-69% (2014)   Type II diabetes mellitus ([place])    Umbilical hernia    a. s/p repair.   Vertigo     Past Surgical History:  Procedure Laterality Date   ANKLE ARTHROSCOPY WITH DRILLING/MICROFRACTURE Left 04/13/2013   Procedure: LEFT ANKLE ARTHROSCOPY WITH EXTENSIVE DEBRIEDMENT;  Surgeon: Wylene Simmer, MD;  Location: [facility];  Service: Orthopedics;  Laterality: Left;   ANKLE SURGERY Left 08/18/2013   DR HEWITT   CARDIAC CATHETERIZATION  2000   stents x3   CARDIAC CATHETERIZATION  02/15/2014   Procedure: LEFT  HEART CATH AND CORS/GRAFTS ANGIOGRAPHY;  Surgeon: Laverda Page, MD;  Location: [facility] CATH LAB;  Service: Cardiovascular;;   COLONOSCOPY     CORONARY ARTERY BYPASS GRAFT  1995   LIMA to LAD, SVG to RCA, SVG to OM.    CORONARY STENT INTERVENTION N/A 08/12/2020   Procedure: CORONARY STENT INTERVENTION;  Surgeon: Nigel Mormon, MD;  Location: [facility] CV LAB;  Service: Cardiovascular;  Laterality: N/A;   LEFT HEART CATH AND CORS/GRAFTS ANGIOGRAPHY N/A 08/12/2020   Procedure: LEFT HEART CATH AND CORS/GRAFTS ANGIOGRAPHY;  Surgeon: Nigel Mormon, MD;  Location: [facility] CV LAB;  Service: Cardiovascular;  Laterality: N/A;   TOTAL ANKLE ARTHROPLASTY Left 08/17/2013   Procedure: LEFT TOTAL ANKLE REPLACEMENT WITH POSSIBLE GASTROC RECESSION ;  Surgeon: Wylene Simmer, MD;  Location: [facility];  Service: Orthopedics;  Laterality: Left;   UMBILICAL HERNIA REPAIR  12/2007    Family History  Problem Relation Age of Onset   Aneurysm Mother    Heart disease Father    Heart attack Father    Stroke Brother     Social History   Socioeconomic History   Marital status: Married    Spouse name: Not on file   Number of children: 4   Years of education: Not on file   Highest education level: Not on file  Occupational History   Not on file  Tobacco Use   Smoking status: Former    Packs/day: 1.00    Years: 9.00    Pack years: 9.00    Types: Cigarettes    Quit date: 10/31/1970    Years since quitting: 50.0   Smokeless tobacco: Former    Types: Chew   Tobacco comments:    chewed for 2 years  Vaping Use   Vaping Use: Never used  Substance and Sexual Activity   Alcohol use: Not Currently    Comment: occ wine last 6 months   Drug use: No   Sexual activity: Yes  Other Topics Concern   Not on file  Social History Narrative   Lives in [place] with wife.  Retired.   Social Determinants of Health   Financial Resource Strain: Low Risk    Difficulty of Paying Living Expenses: Not very  hard  Food Insecurity: Not on file  Transportation Needs: Not on file  Physical Activity: Not on file  Stress: Not on file  Social Connections: Not on file  Intimate Partner Violence: Not on file    Review of systems: Review of Systems  Constitutional: Negative for fever and chills.  HENT: Negative.   Eyes: Negative for blurred vision.  Respiratory: as per HPI  Cardiovascular: Negative for chest pain and palpitations.  Gastrointestinal: Negative for vomiting, diarrhea, blood per rectum. Genitourinary: Negative for dysuria, urgency, frequency  and hematuria.  Musculoskeletal: Negative for myalgias, back pain and joint pain.  Skin: Negative for itching and rash.  Neurological: Negative for dizziness, tremors, focal weakness, seizures and loss of consciousness.  Endo/Heme/Allergies: Negative for environmental allergies.  Psychiatric/Behavioral: Negative for depression, suicidal ideas and hallucinations.  All other systems reviewed and are negative.  Physical Exam: Blood pressure 120/68, pulse (!) 55, height 5\' 8"  (1.727 m), weight 230 lb 3.2 oz (104.4 kg), SpO2 96 %. 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 I have reviewed the images personally  PFTs:   Labs:  Assessment:  Pulmonary fibrosis Probably has IPF given UIP pattern on CT scan and no exposures or signs and symptoms of connective tissue disease  Will check CTD profile, ILD questionnaire for further evaluation Repeat high-res CT to see if there is any progression, check PFTs  He  will return to clinic in 1 to 2 months with his wife so we can have a detailed discussion on the results and plan for therapy with antifibrotics if needed  Plan/Recommendations: CTD labs, ILD questionnaire High-res CT, PFTs  Marshell Garfinkel MD [facility] 11/19/2020, 10:37 AM  CC: Susy Frizzle, MD

## 2020-11-19 NOTE — Patient Instructions (Signed)
We will get some labs today for further work-up of interstitial lung disease We will give you questionnaire to fill out Schedule high-resolution CT and pulmonary function test for better evaluation for lung Follow-up in 1 to 2 months for review and plan for next steps

## 2020-11-21 ENCOUNTER — Telehealth: Payer: Self-pay | Admitting: Pharmacist

## 2020-11-21 LAB — ANTI-SCLERODERMA ANTIBODY: Scleroderma (Scl-70) (ENA) Antibody, IgG: <1 AI

## 2020-11-21 LAB — SJOGREN'S SYNDROME ANTIBODS(SSA + SSB)
SSA (Ro) (ENA) Antibody, IgG: <1 AI
SSB (La) (ENA) Antibody, IgG: <1 AI

## 2020-11-21 LAB — ANA,IFA RA DIAG PNL W/RFLX TIT/PATN
Anti Nuclear Antibody (ANA): NEGATIVE
Cyclic Citrullin Peptide Ab: <16 UNITS
Rheumatoid fact SerPl-aCnc: <14 IU/mL (ref <14)

## 2020-11-21 LAB — ANCA SCREEN W REFLEX TITER: ANCA Screen: NEGATIVE

## 2020-11-21 NOTE — Progress Notes (Addendum)
    Chronic Care Management Pharmacy Assistant   Name: Cheo Selvey.  MRN: [id number] DOB: 1940-11-22  Reason for Encounter: Adherence Review  Reviewed the patients chart for any medical/health and/or medication changes there were not any changes at this time.   Consults Visit: 11/19/20 Marshell Garfinkel, MD. For Interstitial Pulmonary Disease. No medication changes. 11/13/20 Cardiology Adrian Prows, MD. For follow-up. No medication changes  Follow-Up:Pharmacist Review  Charlann Lange, RMA Clinical Pharmacist Assistant [phone]

## 2020-11-23 LAB — HYPERSENSITIVITY PNEUMONITIS
A. Pullulans Abs: NEGATIVE
A.Fumigatus #1 Abs: NEGATIVE
Micropolyspora faeni, IgG: NEGATIVE
Pigeon Serum Abs: NEGATIVE
Thermoact. Saccharii: NEGATIVE
Thermoactinomyces vulgaris, IgG: NEGATIVE

## 2020-12-04 ENCOUNTER — Ambulatory Visit (INDEPENDENT_AMBULATORY_CARE_PROVIDER_SITE_OTHER)
Admission: RE | Admit: 2020-12-04 | Discharge: 2020-12-04 | Disposition: A | Discharge status: Home / Self Care | Payer: Medicare HMO | Source: Ambulatory Visit | Attending: Pulmonary Disease | Admitting: Pulmonary Disease

## 2020-12-04 ENCOUNTER — Other Ambulatory Visit: Payer: Self-pay

## 2020-12-04 ENCOUNTER — Other Ambulatory Visit: Payer: Self-pay | Admitting: Cardiology

## 2020-12-04 DIAGNOSIS — J479 Bronchiectasis, uncomplicated: Secondary | ICD-10-CM | POA: Diagnosis not present

## 2020-12-04 DIAGNOSIS — J849 Interstitial pulmonary disease, unspecified: Secondary | ICD-10-CM | POA: Diagnosis not present

## 2020-12-04 DIAGNOSIS — I700 Atherosclerosis of aorta: Secondary | ICD-10-CM | POA: Diagnosis not present

## 2020-12-08 LAB — MYOMARKER 3 PLUS PROFILE (RDL)
Anti-EJ Ab (RDL): NEGATIVE
Anti-Jo-1 Ab (RDL): <20 Units (ref <20)
Anti-Ku Ab (RDL): NEGATIVE
Anti-MDA-5 Ab (CADM-140)(RDL): <20 Units (ref <20)
Anti-Mi-2 Ab (RDL): NEGATIVE
Anti-NXP-2 (P140) Ab (RDL): <20 Units (ref <20)
Anti-OJ Ab (RDL): NEGATIVE
Anti-PL-12 Ab (RDL: NEGATIVE
Anti-PL-7 Ab (RDL): NEGATIVE
Anti-PM/Scl-100 Ab (RDL): 76 Units — ABNORMAL HIGH (ref <20)
Anti-SAE1 Ab, IgG (RDL): <20 Units (ref <20)
Anti-SRP Ab (RDL): NEGATIVE
Anti-SS-A 52kD Ab, IgG (RDL): <20 Units (ref <20)
Anti-TIF-1gamma Ab (RDL): <20 Units (ref <20)
Anti-U1 RNP Ab (RDL): <20 Units (ref <20)
Anti-U2 RNP Ab (RDL): NEGATIVE
Anti-U3 RNP (Fibrillarin)(RDL): NEGATIVE

## 2020-12-12 ENCOUNTER — Ambulatory Visit: Payer: Medicare HMO | Admitting: Student

## 2020-12-12 ENCOUNTER — Encounter: Payer: Self-pay | Admitting: Student

## 2020-12-12 ENCOUNTER — Other Ambulatory Visit: Payer: Self-pay

## 2020-12-12 ENCOUNTER — Emergency Department (HOSPITAL_COMMUNITY): Payer: Medicare HMO

## 2020-12-12 ENCOUNTER — Emergency Department (HOSPITAL_COMMUNITY)
Admission: EM | Admit: 2020-12-12 | Discharge: 2020-12-13 | Disposition: A | Discharge status: Home / Self Care | Payer: Medicare HMO | Attending: Emergency Medicine | Admitting: Emergency Medicine

## 2020-12-12 VITALS — BP 130/71 | HR 91 | Temp 98.6°F | Resp 16 | Ht 68.0 in | Wt 234.0 lb

## 2020-12-12 DIAGNOSIS — E782 Mixed hyperlipidemia: Secondary | ICD-10-CM

## 2020-12-12 DIAGNOSIS — I130 Hypertensive heart and chronic kidney disease with heart failure and stage 1 through stage 4 chronic kidney disease, or unspecified chronic kidney disease: Secondary | ICD-10-CM | POA: Diagnosis not present

## 2020-12-12 DIAGNOSIS — Z7984 Long term (current) use of oral hypoglycemic drugs: Secondary | ICD-10-CM | POA: Insufficient documentation

## 2020-12-12 DIAGNOSIS — I5042 Chronic combined systolic (congestive) and diastolic (congestive) heart failure: Secondary | ICD-10-CM

## 2020-12-12 DIAGNOSIS — E1122 Type 2 diabetes mellitus with diabetic chronic kidney disease: Secondary | ICD-10-CM | POA: Diagnosis not present

## 2020-12-12 DIAGNOSIS — N189 Chronic kidney disease, unspecified: Secondary | ICD-10-CM | POA: Diagnosis not present

## 2020-12-12 DIAGNOSIS — Z96662 Presence of left artificial ankle joint: Secondary | ICD-10-CM | POA: Insufficient documentation

## 2020-12-12 DIAGNOSIS — I25119 Atherosclerotic heart disease of native coronary artery with unspecified angina pectoris: Secondary | ICD-10-CM | POA: Diagnosis not present

## 2020-12-12 DIAGNOSIS — I959 Hypotension, unspecified: Secondary | ICD-10-CM | POA: Diagnosis not present

## 2020-12-12 DIAGNOSIS — L500 Allergic urticaria: Secondary | ICD-10-CM | POA: Diagnosis not present

## 2020-12-12 DIAGNOSIS — T7840XA Allergy, unspecified, initial encounter: Secondary | ICD-10-CM | POA: Diagnosis not present

## 2020-12-12 DIAGNOSIS — Z7902 Long term (current) use of antithrombotics/antiplatelets: Secondary | ICD-10-CM | POA: Insufficient documentation

## 2020-12-12 DIAGNOSIS — I25118 Atherosclerotic heart disease of native coronary artery with other forms of angina pectoris: Secondary | ICD-10-CM

## 2020-12-12 DIAGNOSIS — Z87891 Personal history of nicotine dependence: Secondary | ICD-10-CM | POA: Insufficient documentation

## 2020-12-12 DIAGNOSIS — R0602 Shortness of breath: Secondary | ICD-10-CM | POA: Diagnosis not present

## 2020-12-12 DIAGNOSIS — L509 Urticaria, unspecified: Secondary | ICD-10-CM | POA: Diagnosis not present

## 2020-12-12 DIAGNOSIS — N289 Disorder of kidney and ureter, unspecified: Secondary | ICD-10-CM | POA: Diagnosis not present

## 2020-12-12 DIAGNOSIS — Z951 Presence of aortocoronary bypass graft: Secondary | ICD-10-CM | POA: Diagnosis not present

## 2020-12-12 DIAGNOSIS — R0902 Hypoxemia: Secondary | ICD-10-CM | POA: Diagnosis not present

## 2020-12-12 DIAGNOSIS — Z79899 Other long term (current) drug therapy: Secondary | ICD-10-CM | POA: Diagnosis not present

## 2020-12-12 DIAGNOSIS — I517 Cardiomegaly: Secondary | ICD-10-CM | POA: Diagnosis not present

## 2020-12-12 DIAGNOSIS — T782XXA Anaphylactic shock, unspecified, initial encounter: Secondary | ICD-10-CM | POA: Diagnosis not present

## 2020-12-12 DIAGNOSIS — N179 Acute kidney failure, unspecified: Secondary | ICD-10-CM | POA: Insufficient documentation

## 2020-12-12 DIAGNOSIS — E1140 Type 2 diabetes mellitus with diabetic neuropathy, unspecified: Secondary | ICD-10-CM | POA: Insufficient documentation

## 2020-12-12 DIAGNOSIS — R0600 Dyspnea, unspecified: Secondary | ICD-10-CM | POA: Diagnosis not present

## 2020-12-12 DIAGNOSIS — I10 Essential (primary) hypertension: Secondary | ICD-10-CM | POA: Diagnosis not present

## 2020-12-12 DIAGNOSIS — Z7982 Long term (current) use of aspirin: Secondary | ICD-10-CM | POA: Insufficient documentation

## 2020-12-12 DIAGNOSIS — K449 Diaphragmatic hernia without obstruction or gangrene: Secondary | ICD-10-CM | POA: Diagnosis not present

## 2020-12-12 DIAGNOSIS — I509 Heart failure, unspecified: Secondary | ICD-10-CM | POA: Insufficient documentation

## 2020-12-12 LAB — BASIC METABOLIC PANEL
Anion gap: 8 (ref 5–15)
BUN: 18 mg/dL (ref 8–23)
CO2: 18 mmol/L — ABNORMAL LOW (ref 22–32)
Calcium: 8.8 mg/dL — ABNORMAL LOW (ref 8.9–10.3)
Chloride: 109 mmol/L (ref 98–111)
Creatinine, Ser: 1.8 mg/dL — ABNORMAL HIGH (ref 0.61–1.24)
GFR, Estimated: 38 mL/min — ABNORMAL LOW (ref >60)
Glucose, Bld: 151 mg/dL — ABNORMAL HIGH (ref 70–99)
Potassium: 3.8 mmol/L (ref 3.5–5.1)
Sodium: 135 mmol/L (ref 135–145)

## 2020-12-12 LAB — CBC WITH DIFFERENTIAL/PLATELET
Abs Immature Granulocytes: 0.05 10*3/uL (ref 0.00–0.07)
Basophils Absolute: 0 10*3/uL (ref 0.0–0.1)
Basophils Relative: 0 %
Eosinophils Absolute: 0.1 10*3/uL (ref 0.0–0.5)
Eosinophils Relative: 1 %
HCT: 44.3 % (ref 39.0–52.0)
Hemoglobin: 14.1 g/dL (ref 13.0–17.0)
Immature Granulocytes: 0 %
Lymphocytes Relative: 20 %
Lymphs Abs: 2.6 10*3/uL (ref 0.7–4.0)
MCH: 28.8 pg (ref 26.0–34.0)
MCHC: 31.8 g/dL (ref 30.0–36.0)
MCV: 90.4 fL (ref 80.0–100.0)
Monocytes Absolute: 0.9 10*3/uL (ref 0.1–1.0)
Monocytes Relative: 7 %
Neutro Abs: 9.1 10*3/uL — ABNORMAL HIGH (ref 1.7–7.7)
Neutrophils Relative %: 72 %
Platelets: 276 10*3/uL (ref 150–400)
RBC: 4.9 MIL/uL (ref 4.22–5.81)
RDW: 13.5 % (ref 11.5–15.5)
WBC: 12.7 10*3/uL — ABNORMAL HIGH (ref 4.0–10.5)
nRBC: 0 % (ref 0.0–0.2)

## 2020-12-12 LAB — BRAIN NATRIURETIC PEPTIDE: B Natriuretic Peptide: 142.2 pg/mL — ABNORMAL HIGH (ref 0.0–100.0)

## 2020-12-12 MED ORDER — METHYLPREDNISOLONE SODIUM SUCC 125 MG IJ SOLR
125.0000 mg | Freq: Once | INTRAMUSCULAR | Status: AC
  Administered 2020-12-12: 125 mg via INTRAVENOUS
  Filled 2020-12-12: qty 2

## 2020-12-12 MED ORDER — EPINEPHRINE 0.3 MG/0.3ML IJ SOAJ: 0.3000 mg | INTRAMUSCULAR | 0 refills | Status: DC | PRN

## 2020-12-12 NOTE — ED Provider Notes (Signed)
EKG Interpretation  Date/Time:  Thursday December 12 2020 23:26:17 EDT Ventricular Rate:  85 PR Interval:  167 QRS Duration: 137 QT Interval:  400 QTC Calculation: 476 R Axis:   133 Text Interpretation: Sinus rhythm RBBB and LPFB Probable inferior infarct, old Confirmed by Ripley Fraise [phone]) on 12/12/2020 11:45:57 PM       Patient stable, no acute distress. No angioedema. Plan to monitor until 2 AM.  Given history, I feel the benefit of an EpiPen at home outweighs any risk.  He will require referral to allergist.  Follow with PCP in 1 week for recheck, allergy referral, recheck of creatinine as it is mildly elevated today    Ripley Fraise, MD 12/12/20 2347

## 2020-12-12 NOTE — Progress Notes (Signed)
Primary Physician/Referring:  Susy Frizzle, MD  Patient ID: [name], male    DOB: January 29, 1941, 80 y.o.   MRN: [id number]  Chief Complaint  Patient presents with   Coronary artery disease involving native coronary artery of   Chest Pain   Chronic Renal Failure   Follow-up    4 weeks   Results    MRI    HPI:    Graylin Sperling.  is a 80 y.o. 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  Patient underwent PCI to high-grade stenosis of the SVG to RCA in March 2022.  Patient presents for 4-week follow-up of angina pectoris and chronic renal insufficiency.  At last visit [name] and recommended initiation of valsartan or Entresto, however patient refused to start any new medications.  Patient has not had previously ordered BNP or BMP done since last visit.  He does continue to have episodes of chest pain typically following physical activity lasting less than 5 minutes.  He has not tried nitroglycerin during these episodes.  He has slowly been increasing his physical activity, particularly over the last 2 weeks.  He also complains of fatigue over the last few months. Otherwise dyspnea remained stable, no PND or orthopnea, no leg edema.  Past Medical History:  Diagnosis Date   Abnormal exercise myocardial perfusion study    03/2018- 38% ef, see report   Arthritis    Chronic kidney disease    Congestive heart failure (CHF) ([place]) 5/17   EF 35-40% [name]   Coronary artery disease    a. 1995 s/p CABG x 3 (VG->RCA, LIMA->LAD, VG->OM);  b. 07/2008 Inf MI/Cath/PCI: VG->RCA 99 - treated with Taxus DES (53mm), LIMA and VG->OM patent, LAD 100, LCX 100, RCA 60d, EF 50%;  c. 09/2008 PCI native RCA  w/ 2.5x23 Xience DES, VG->RCA  stent patent;  c. 05/2010 Cath: Native 3VD with 3/3 patent grafts, native RCA w 80% ISR prox to graft insertion-->Med Rx.   DOE (dyspnea on exertion)    ED (erectile dysfunction)    GERD (gastroesophageal reflux disease)    occ   Gout    H/O hiatal hernia    Hyperlipidemia    Hypertension    Myocardial infarction ([place])    Neuropathy    toes   Obesity    RBBB (right bundle branch block)    Stenosis of right internal carotid artery    50-69% (2014)   Type II diabetes mellitus ([place])    Umbilical hernia    a. s/p repair.   Vertigo    Past Surgical History:  Procedure Laterality Date   ANKLE ARTHROSCOPY WITH DRILLING/MICROFRACTURE Left 04/13/2013   Procedure: LEFT ANKLE ARTHROSCOPY WITH EXTENSIVE DEBRIEDMENT;  Surgeon: Wylene Simmer, MD;  Location: [facility];  Service: Orthopedics;  Laterality: Left;   ANKLE SURGERY Left 08/18/2013   DR HEWITT   CARDIAC CATHETERIZATION  2000   stents x3   CARDIAC CATHETERIZATION  02/15/2014   Procedure: LEFT HEART CATH AND CORS/GRAFTS ANGIOGRAPHY;  Surgeon: Laverda Page, MD;  Location: [facility] CATH LAB;  Service: Cardiovascular;;   COLONOSCOPY     CORONARY ARTERY BYPASS GRAFT  1995   LIMA to LAD, SVG to RCA, SVG to OM.    CORONARY STENT  INTERVENTION N/A 08/12/2020   Procedure: CORONARY STENT INTERVENTION;  Surgeon: Nigel Mormon, MD;  Location: [facility] CV LAB;  Service: Cardiovascular;  Laterality: N/A;   LEFT HEART CATH AND CORS/GRAFTS ANGIOGRAPHY N/A 08/12/2020   Procedure: LEFT HEART CATH AND CORS/GRAFTS ANGIOGRAPHY;  Surgeon: Nigel Mormon, MD;  Location: [facility] CV LAB;  Service: Cardiovascular;  Laterality: N/A;   TOTAL ANKLE ARTHROPLASTY Left 08/17/2013   Procedure: LEFT TOTAL ANKLE REPLACEMENT WITH POSSIBLE GASTROC RECESSION ;  Surgeon: Wylene Simmer, MD;  Location: [facility];  Service: Orthopedics;  Laterality: Left;   UMBILICAL HERNIA REPAIR  12/2007   Family History  Problem Relation Age of Onset   Aneurysm  Mother    Heart disease Father    Heart attack Father    Stroke Brother     Social History   Tobacco Use   Smoking status: Former    Packs/day: 1.00    Years: 9.00    Pack years: 9.00    Types: Cigarettes    Quit date: 10/31/1970    Years since quitting: 50.1   Smokeless tobacco: Former    Types: Chew   Tobacco comments:    chewed for 2 years  Substance Use Topics   Alcohol use: Not Currently    Comment: occ wine last 6 months   ROS  Review of Systems  Cardiovascular:  Positive for chest pain and dyspnea on exertion (stable). Negative for leg swelling.  Musculoskeletal:  Positive for arthritis.  Gastrointestinal:  Negative for melena.  Objective  Blood pressure 130/71, pulse 91, temperature 98.6 F (37 C), resp. rate 16, height 5\' 8"  (1.727 m), weight 234 lb (106.1 kg), SpO2 95 %.  Vitals with BMI 12/12/2020 11/19/2020 11/13/2020  Height 5\' 8"  5\' 8"  5\' 8"   Weight 234 lbs 230 lbs 3 oz 231 lbs  BMI 35.59 53.64 68.03  Systolic 212 248 250  Diastolic 71 68 70  Pulse 91 55 87   Trace edema   Physical Exam Vitals reviewed.  Constitutional:      General: He is not in acute distress.    Appearance: He is well-developed. He is obese.  Neck:     Vascular: No JVD.  Cardiovascular:     Rate and Rhythm: Normal rate and regular rhythm.     Pulses:          Carotid pulses are 2+ on the right side and 2+ on the left side.    Heart sounds: Normal heart sounds. No murmur heard.   No gallop.     Comments: No JVD, No leg edema.  Fem and Pop pulse difficult to feel due to body habitus.  DP  Normal and PT absent bilateral Pulmonary:     Effort: Pulmonary effort is normal.     Breath sounds: Rales (left worse than right coarse crackles) present.  Musculoskeletal:        General: Normal range of motion.  Neurological:     Mental Status: He is alert.   Laboratory examination:   Recent Labs    05/15/20 1000 08/12/20 1253 08/30/20 1456 09/04/20 1603 09/05/20 0155 09/23/20 0425   NA 141   < > 139 135 137 140  K 4.7   < > 4.6 4.4 3.9 4.0  CL 104   < > 104 105 106 107  CO2 21   < > 25 21* 24 24  GLUCOSE 134*   < > 92 98 95 154*  BUN 13   < >  16 16 16 12   CREATININE 1.24   < > 1.23* 1.50* 1.39* 1.52*  CALCIUM 9.6   < > 9.8 9.1 8.9 9.0  GFRNONAA 56*   < > 56* 48* 52* 47*  GFRAA 64  --  65  --   --   --    < > = values in this interval not displayed.   CrCl cannot be calculated (Patient's most recent lab result is older than the maximum 21 days allowed.).  CMP Latest Ref Rng & Units 09/23/2020 09/05/2020 09/04/2020  Glucose 70 - 99 mg/dL 154(H) 95 98  BUN 8 - 23 mg/dL 12 16 16   Creatinine 0.61 - 1.24 mg/dL 1.52(H) 1.39(H) 1.50(H)  Sodium 135 - 145 mmol/L 140 137 135  Potassium 3.5 - 5.1 mmol/L 4.0 3.9 4.4  Chloride 98 - 111 mmol/L 107 106 105  CO2 22 - 32 mmol/L 24 24 21(L)  Calcium 8.9 - 10.3 mg/dL 9.0 8.9 9.1  Total Protein 6.5 - 8.1 g/dL 6.9 - -  Total Bilirubin 0.3 - 1.2 mg/dL 0.6 - -  Alkaline Phos 38 - 126 U/L 30(L) - -  AST 15 - 41 U/L 24 - -  ALT 0 - 44 U/L 16 - -   CBC Latest Ref Rng & Units 09/23/2020 09/04/2020 08/30/2020  WBC 4.0 - 10.5 K/uL 10.2 8.4 7.5  Hemoglobin 13.0 - 17.0 g/dL 12.8(L) 13.2 13.8  Hematocrit 39.0 - 52.0 % 39.1 40.3 40.7  Platelets 150 - 400 K/uL 228 230 246   Lipid Panel Recent Labs    05/15/20 1000 08/12/20 1253 08/13/20 0306  CHOL 183 126 110  TRIG 344* 220* 230*  LDLCALC 87 40 29  VLDL  --  44* 46*  HDL 40 42 35*  CHOLHDL  --  3.0 3.1    HEMOGLOBIN A1C Lab Results  Component Value Date   HGBA1C 6.4 (H) 08/12/2020   MPG 136.98 08/12/2020   TSH No results for input(s): TSH in the last 8760 hours. Allergies   Allergies  Allergen Reactions   Brilinta [Ticagrelor] Shortness Of Breath   Crestor [Rosuvastatin Calcium] Other (See Comments)    Muscle pain- tolerating this 2022, however   Lipitor [Atorvastatin] Other (See Comments)    Intolerable muscle pain all over       Medications Prior to Visit:    Outpatient Medications Prior to Visit  Medication Sig Dispense Refill   acetaminophen (TYLENOL) 500 MG tablet Take 500 mg by mouth every 6 (six) hours as needed for mild pain.     aspirin EC 81 MG tablet Take 1 tablet (81 mg total) by mouth daily.     clopidogrel (PLAVIX) 75 MG tablet TAKE 8 TABLETS (600 MG TOTAL) BY MOUTH ONCE FOR 1 DOSE TONIGHT THEN TAKE 1 TABLET DAILY (Patient taking differently: Take 75 mg by mouth daily.) 38 tablet 0   metFORMIN (GLUCOPHAGE-XR) 500 MG 24 hr tablet Take 1,000 mg by mouth daily with breakfast.     metoprolol succinate (TOPROL-XL) 25 MG 24 hr tablet Take 1/2 tablet (12.5 mg total) by mouth daily. 30 tablet 1   nitroGLYCERIN (NITROSTAT) 0.4 MG SL tablet Place 0.4 mg under the tongue every 5 (five) minutes as needed for chest pain.     rosuvastatin (CRESTOR) 40 MG tablet TAKE 1 TABLET EVERY DAY 90 tablet 0   tamsulosin (FLOMAX) 0.4 MG CAPS capsule Take 0.4 mg by mouth.     No facility-administered medications prior to visit.     Final  Medications at End of Visit    Current Meds  Medication Sig   acetaminophen (TYLENOL) 500 MG tablet Take 500 mg by mouth every 6 (six) hours as needed for mild pain.   aspirin EC 81 MG tablet Take 1 tablet (81 mg total) by mouth daily.   clopidogrel (PLAVIX) 75 MG tablet TAKE 8 TABLETS (600 MG TOTAL) BY MOUTH ONCE FOR 1 DOSE TONIGHT THEN TAKE 1 TABLET DAILY (Patient taking differently: Take 75 mg by mouth daily.)   metFORMIN (GLUCOPHAGE-XR) 500 MG 24 hr tablet Take 1,000 mg by mouth daily with breakfast.   metoprolol succinate (TOPROL-XL) 25 MG 24 hr tablet Take 1/2 tablet (12.5 mg total) by mouth daily.   nitroGLYCERIN (NITROSTAT) 0.4 MG SL tablet Place 0.4 mg under the tongue every 5 (five) minutes as needed for chest pain.   rosuvastatin (CRESTOR) 40 MG tablet TAKE 1 TABLET EVERY DAY   tamsulosin (FLOMAX) 0.4 MG CAPS capsule Take 0.4 mg by mouth.      Radiology:   CT angio chest 07/25/2019: 1. No evidence of  pulmonary embolism. 2. Scattered ground-glass density throughout both lungs with increased central and subpleural reticulation and areas of mosaic attenuation. No frank honeycombing or significant bronchiectasis. Differential considerations include chronic hypersensitivity pneumonitis or idiopathic interstitial pneumonia such as NSIP. Pulmonary consultation is suggested, as well as high-resolution chest CT follow-up in 6 months. 3. 5 mm pulmonary nodule along the right major fissure. No follow-up needed if patient is low-risk. Non-contrast chest CT can be considered in 12 months if patient is high-risk.  4.  Aortic atherosclerosis (ICD10-I70.0).  CT Chest 12/04/2020:  1. 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 Findings are consistent with UIP per consensus guidelines: Diagnosis of Idiopathic Pulmonary Fibrosis: An Official ATS/ERS/JRS/ALAT Clinical Practice Guideline. [place], Iss 5, [phone], Jan 23 2017. 2. Stable borderline mild cardiomegaly. 3. Large hiatal hernia. 4. Aortic Atherosclerosis (ICD10-I70.0).  Cardiac Studies:   Lexiscan myoview stress test 04/11/2018: 1. Lexiscan stress test was performed. Exercise capacity was not assessed. No stress symptoms reported. Blood pressure was normal.  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  Stress EKG is non diagnostic for ischemia as it is a pharmacologic stress. 2. The overall quality of the study is good.  Left ventricular cavity is noted to be normal on the rest and stress studies.  Gated SPECT imaging demonstrates hypokinesis of the basal inferior, basal inferolateral, mid inferior, mid inferolateral, apical inferior and apical lateral myocardial wall(s).  The left ventricular ejection fraction was  calculated or visually estimated to be 38%. LSPECT images reveal a large sized, medium intensity, minimally reversible perfusion defect suggestive of large infarct with minimal peri infarct ischemia in LCx/PDA territory.   3. High risk study.  Overnight oximetry test 10/11/2019  Desaturation below 88% 17.6 minutes.  Left heart catheterization 08/12/2020: LM: Normal LAD: 100% occlusion after D1 LCx: 100% prox occlusion RCA: 100% mid occlusion with ISR LIMA-LAD: Patent SVG-OM3: Patent SVG-RCA: Patent prox-mid stent with 40% lumen loss ( 09/2008 PCI with 38 mm Taxus DES)                90% and 80% lesions in distal part of SVG-RCA                90% lesion at SVG-to RCA anastomosis   LVEF 40-45%.  LVEDP 18 mmHg   Successful stenting of SVG to RCA, distal segment with 3.5 x 38 mm and anastamosis with 2.5 x 12 mm resolute Onyx DES.  Echocardiogram 11/06/2020:   Left ventricle cavity is normal in size. Moderate concentric hypertrophy of the left ventricle.  Left ventricle regional wall motion findings: Basal inferolateral, Basal inferior, Mid inferolateral and Mid inferior akinesis. Moderate decrease in global wall motion. Visual EF is 35-40%. Doppler evidence of grade II (pseudonormal) diastolic dysfunction, elevated LAP. Left atrial cavity is moderately dilated by volume. Right ventricle cavity is mildly dilated. Normal right ventricular function. Compared to 08/13/2020, no significant change.  Carotid artery duplex 11/06/2020: Doppler velocity suggests stenosis in the right internal carotid artery (1-15%). Doppler velocity suggests stenosis in the left internal carotid artery (16-49%). Antegrade right vertebral artery flow. Antegrade left vertebral artery flow. Compared to 04/24/2020, right ICA stenosis was 50-69% stenosis. Follow up in one year is appropriate if clinically indicated.   EKG   11/13/2020: Normal sinus rhythm at rate of 64 beats minute, left atrial enlargement, right axis  deviation, cannot exclude inferior infarct old.  Right bundle branch block.  No significant change from 08/12/2020. [place] pulmonary disease pattern.    Assessment   1. Coronary artery disease of native artery of native heart with stable angina pectoris ([place])   2. Chronic combined systolic and diastolic heart failure ([place])   3. Mixed hyperlipidemia     No orders of the defined types were placed in this encounter.  There are no discontinued medications.   No orders of the defined types were placed in this encounter.    Recommendations:   Keaghan Staton.  is a 80 y.o. Caucasian male patient with hypertension, hyperlipidemia, diabetes mellitus diagnosed in 2018, CAD, hx of MI with CABG S/P multiple coronary interventions,  H/O stroke in past without residual deficits, chronic systolic and diastolic CHF with EF 36-64% and asymptomatic carotid artery stenosis.  He had presented with unstable anginal-like symptoms to our office in March 2022, underwent successful angioplasty to saphenous vein graft to the right coronary artery.  Patient presents for 4-week follow-up of angina pectoris and chronic renal insufficiency.  At last visit [name] and recommended initiation of valsartan or Entresto, however patient refused to start any new medications.  Again discussed with patient regarding initiation of valsartan or Entresto as well as option to start Imdur given patient's continued anginal symptoms.  Patient refuses medication changes, despite discussion of benefits.  His anginal symptoms are unchanged from previous visit. Patient will obtain previously ordered BNP and BMP.  Again discussed with patient regarding sublingual nitroglycerin use, he prefers to attempt management of chest pain symptoms with as needed sublingual nitroglycerin.  He continues to follow closely with pulmonology for management of IPF.  Triglycerides remain uncontrolled.  Again discussed with patient regarding diet and  lifestyle modifications including weight loss.  Patient's dyspnea is likely multifactorial including heart failure, CAD, obesity, and IPF.  Follow-up in 6 weeks, sooner if needed, for CAD with angina.   Alethia Berthold, PA-C 12/12/2020, 5:41 PM Office: [phone]

## 2020-12-12 NOTE — ED Provider Notes (Signed)
[facility] EMERGENCY DEPARTMENT Provider Note   CSN: [id number] Arrival date & time: 12/12/20  2211     History No chief complaint on file.   Chris [name]. is a 80 y.o. male.  He is brought in by EMS for possible allergic reaction.  He said he went out to eat and had a burger and some vegetables.  He was driving to get some ice cream when he acutely felt short of breath and had a lot of mucus that he was spitting up.  He went home and found himself to be covered in hives.  It was very itchy.  EMS was called and gave him subcu epinephrine.  Wife had also given him 50 mg of Benadryl.  He denies any difficulty speaking or swallowing.  Shortness of breath is improved.  No chest pain.  The history is provided by the patient and the EMS personnel.  Allergic Reaction Presenting symptoms: difficulty breathing, itching and rash   Difficulty breathing:    Severity:  Moderate   Onset quality:  Sudden   Duration:  1 hour   Timing:  Constant   Progression:  Improving Severity:  Moderate Duration:  1 hour Prior allergic episodes:  No prior episodes Worsened by:  Nothing Ineffective treatments:  Antihistamines and epinephrine     Past Medical History:  Diagnosis Date   Abnormal exercise myocardial perfusion study    03/2018- 38% ef, see report   Arthritis    Chronic kidney disease    Congestive heart failure (CHF) ([place]) 5/17   EF 35-40% [name]   Coronary artery disease    a. 1995 s/p CABG x 3 (VG->RCA, LIMA->LAD, VG->OM);  b. 07/2008 Inf MI/Cath/PCI: VG->RCA 99 - treated with Taxus DES (67mm), LIMA and VG->OM patent, LAD 100, LCX 100, RCA 60d, EF 50%;  c. 09/2008 PCI native RCA  w/ 2.5x23 Xience DES, VG->RCA stent patent;  c. 05/2010 Cath: Native 3VD with 3/3 patent grafts, native RCA w 80% ISR prox to graft insertion-->Med Rx.   DOE (dyspnea on exertion)    ED (erectile dysfunction)    GERD (gastroesophageal reflux disease)    occ   Gout    H/O hiatal  hernia    Hyperlipidemia    Hypertension    Myocardial infarction ([place])    Neuropathy    toes   Obesity    RBBB (right bundle branch block)    Stenosis of right internal carotid artery    50-69% (2014)   Type II diabetes mellitus ([place])    Umbilical hernia    a. s/p repair.   Vertigo     Patient Active Problem List   Diagnosis Date Noted   Unstable angina ([place]) 09/04/2020   Chronic combined systolic and diastolic CHF (congestive heart failure) ([place])    Coronary artery disease involving native coronary artery of native heart with unstable angina pectoris ([place]) 08/12/2020   Abnormal exercise myocardial perfusion study    Congestive heart failure (CHF) ([place])    NSTEMI (non-ST elevated myocardial infarction) ([place]) 02/15/2014   Type II diabetes mellitus ([place])    Arthritis of left ankle 08/17/2013   Stenosis of right internal carotid artery    Precordial pain 06/03/2012   TIA (transient ischemic attack) 10/02/2011   Peripheral neuropathy 04/14/2011   CAD (coronary artery disease) 11/04/2010   Hypertension 11/04/2010   Renal insufficiency 11/04/2010   Obese 11/04/2010   CVA (cerebral vascular accident) ([place]) 11/04/2010   Hyperlipemia 11/04/2010  Past Surgical History:  Procedure Laterality Date   ANKLE ARTHROSCOPY WITH DRILLING/MICROFRACTURE Left 04/13/2013   Procedure: LEFT ANKLE ARTHROSCOPY WITH EXTENSIVE DEBRIEDMENT;  Surgeon: Wylene Simmer, MD;  Location: [place];  Service: Orthopedics;  Laterality: Left;   ANKLE SURGERY Left 08/18/2013   DR HEWITT   CARDIAC CATHETERIZATION  2000   stents x3   CARDIAC CATHETERIZATION  02/15/2014   Procedure: LEFT HEART CATH AND CORS/GRAFTS ANGIOGRAPHY;  Surgeon: Laverda Page, MD;  Location: [facility] CATH LAB;  Service: Cardiovascular;;   COLONOSCOPY     CORONARY ARTERY BYPASS GRAFT  1995   LIMA to LAD, SVG to RCA, SVG to OM.    CORONARY STENT INTERVENTION N/A 08/12/2020   Procedure: CORONARY STENT INTERVENTION;  Surgeon:  Nigel Mormon, MD;  Location: [place] CV LAB;  Service: Cardiovascular;  Laterality: N/A;   LEFT HEART CATH AND CORS/GRAFTS ANGIOGRAPHY N/A 08/12/2020   Procedure: LEFT HEART CATH AND CORS/GRAFTS ANGIOGRAPHY;  Surgeon: Nigel Mormon, MD;  Location: [place] CV LAB;  Service: Cardiovascular;  Laterality: N/A;   TOTAL ANKLE ARTHROPLASTY Left 08/17/2013   Procedure: LEFT TOTAL ANKLE REPLACEMENT WITH POSSIBLE GASTROC RECESSION ;  Surgeon: Wylene Simmer, MD;  Location: [place];  Service: Orthopedics;  Laterality: Left;   UMBILICAL HERNIA REPAIR  12/2007       Family History  Problem Relation Age of Onset   Aneurysm Mother    Heart disease Father    Heart attack Father    Stroke Brother     Social History   Tobacco Use   Smoking status: Former    Packs/day: 1.00    Years: 9.00    Pack years: 9.00    Types: Cigarettes    Quit date: 10/31/1970    Years since quitting: 50.1   Smokeless tobacco: Former    Types: Chew   Tobacco comments:    chewed for 2 years  Vaping Use   Vaping Use: Never used  Substance Use Topics   Alcohol use: Not Currently    Comment: occ wine last 6 months   Drug use: No    Home Medications Prior to Admission medications   Medication Sig Start Date End Date Taking? Authorizing Provider  acetaminophen (TYLENOL) 500 MG tablet Take 500 mg by mouth every 6 (six) hours as needed for mild pain.    [provider]  aspirin EC 81 MG tablet Take 1 tablet (81 mg total) by mouth daily. 02/16/14   Adrian Prows, MD  clopidogrel (PLAVIX) 75 MG tablet TAKE 8 TABLETS (600 MG TOTAL) BY MOUTH ONCE FOR 1 DOSE TONIGHT THEN TAKE 1 TABLET DAILY Patient taking differently: Take 75 mg by mouth daily. 09/12/20 09/12/21  Adrian Prows, MD  metFORMIN (GLUCOPHAGE-XR) 500 MG 24 hr tablet Take 1,000 mg by mouth daily with breakfast.    [provider]  metoprolol succinate (TOPROL-XL) 25 MG 24 hr tablet Take 1/2 tablet (12.5 mg total) by mouth daily. 09/12/20    Adrian Prows, MD  nitroGLYCERIN (NITROSTAT) 0.4 MG SL tablet Place 0.4 mg under the tongue every 5 (five) minutes as needed for chest pain.    [provider]  rosuvastatin (CRESTOR) 40 MG tablet TAKE 1 TABLET EVERY DAY 12/04/20   Adrian Prows, MD  tamsulosin (FLOMAX) 0.4 MG CAPS capsule Take 0.4 mg by mouth.    [provider]    Allergies    Brilinta [ticagrelor], Crestor [rosuvastatin calcium], and Lipitor [atorvastatin]  Review of Systems  Review of Systems  Constitutional:  Negative for fever.  HENT:  Negative for sore throat.   Eyes:  Negative for visual disturbance.  Respiratory:  Positive for shortness of breath.   Cardiovascular:  Negative for chest pain.  Gastrointestinal:  Negative for abdominal pain.  Genitourinary:  Negative for dysuria.  Musculoskeletal:  Negative for neck pain.  Skin:  Positive for itching and rash.  Neurological:  Negative for headaches.   Physical Exam Updated Vital Signs BP 129/88   Pulse 77   Temp 98 F (36.7 C) (Oral)   Resp 15   SpO2 95%   Physical Exam Vitals and nursing note reviewed.  Constitutional:      Appearance: Normal appearance. He is well-developed.  HENT:     Head: Normocephalic and atraumatic.  Eyes:     Conjunctiva/sclera: Conjunctivae normal.  Cardiovascular:     Rate and Rhythm: Normal rate and regular rhythm.     Heart sounds: No murmur heard. Pulmonary:     Effort: Pulmonary effort is normal. No respiratory distress.     Breath sounds: Normal breath sounds.  Abdominal:     Palpations: Abdomen is soft.     Tenderness: There is no abdominal tenderness. There is no guarding or rebound.  Musculoskeletal:        General: No deformity or signs of injury. Normal range of motion.     Cervical back: Neck supple.  Skin:    General: Skin is warm and dry.     Comments: He has area of hives over his trunk arms and upper legs.  Neurological:     General: No focal deficit present.     Mental Status: He is  alert.    ED Results / Procedures / Treatments   Labs (all labs ordered are listed, but only abnormal results are displayed) Labs Reviewed  BASIC METABOLIC PANEL - Abnormal; Notable for the following components:      Result Value   CO2 18 (*)    Glucose, Bld 151 (*)    Creatinine, Ser 1.80 (*)    Calcium 8.8 (*)    GFR, Estimated 38 (*)    All other components within normal limits  CBC WITH DIFFERENTIAL/PLATELET - Abnormal; Notable for the following components:   WBC 12.7 (*)    Neutro Abs 9.1 (*)    All other components within normal limits  BRAIN NATRIURETIC PEPTIDE - Abnormal; Notable for the following components:   B Natriuretic Peptide 142.2 (*)    All other components within normal limits    EKG EKG Interpretation  Date/Time:  Thursday December 12 2020 23:26:17 EDT Ventricular Rate:  85 PR Interval:  167 QRS Duration: 137 QT Interval:  400 QTC Calculation: 476 R Axis:   133 Text Interpretation: Sinus rhythm RBBB and LPFB Probable inferior infarct, old Confirmed by Ripley Fraise [phone]) on 12/12/2020 11:45:57 PM Also confirmed by Ripley Fraise [phone]), editor [place], [place] [phone])  on 12/13/2020 8:38:37 AM  Radiology DG Chest Port 1 View  Result Date: 12/12/2020 CLINICAL DATA:  Dyspnea EXAM: PORTABLE CHEST 1 VIEW COMPARISON:  09/04/2020, CT 12/04/2020 FINDINGS: Pulmonary insufflation is stable and symmetric. 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 No superimposed confluent pulmonary infiltrate. No pneumothorax or pleural effusion. Coronary artery bypass grafting has been performed. Cardiac size is mildly enlarged, unchanged. Large hiatal hernia  again noted. The central pulmonary arteries are enlarged, unchanged from prior examination, in keeping with  changes of pulmonary artery hypertension. No acute bone abnormality. IMPRESSION: No radiographic evidence of acute cardiopulmonary disease. 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 Mild global cardiomegaly. 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 Large hiatal hernia. Electronically Signed   By: Fidela Salisbury MD   On: 12/12/2020 22:34    Procedures Procedures   Medications Ordered in ED Medications  methylPREDNISolone sodium succinate (SOLU-MEDROL) 125 mg/2 mL injection 125 mg (has no administration in time range)    ED Course  I have reviewed the triage vital signs and the nursing notes.  Pertinent labs & imaging results that were available during my care of the patient were reviewed by me and considered in my medical decision making (see chart for details).    MDM Rules/Calculators/A&P                          This patient complains of shortness of breath hives low blood pressure; this involves an extensive number of treatment Options and is a complaint that carries with it a high risk of complications and Morbidity. The differential includes allergic reaction, CHF, ACS, anaphylaxis  I ordered, reviewed and interpreted labs, which included CBC with elevated white count possibly reactive, normal hemoglobin, chemistries with low bicarb elevated creatinine I ordered medication IV Solu-Medrol I ordered imaging studies which included chest x-ray and I independently    visualized and interpreted imaging which showed cardiomegaly Additional history obtained from EMS Previous records obtained and reviewed in epic, recent cardiac stents  After the interventions stated above, I reevaluated the patient and found in no distress speaking in full sentences.  Vital signs within normal limits.  He will need to be observed for 3 to 4 hours for his allergic reaction/anaphylaxis.  His care is signed out to oncoming provider Dr. Christy [name].   Anticipate can be discharged if continues to improve.   Final Clinical Impression(s) / ED Diagnoses Final diagnoses:  Allergic reaction, initial encounter  Renal insufficiency    Rx / DC Orders ED Discharge Orders     None        Hayden Rasmussen, MD 12/13/20 [phone]

## 2020-12-12 NOTE — ED Triage Notes (Signed)
Pt BIBA from home due to new allergy. Pt has itching and hives. Pt c/o SOB pta. BP 80s for EMS. Pt took 74 benadry at home and EMS gave.3 of epi.

## 2020-12-13 ENCOUNTER — Other Ambulatory Visit: Payer: Self-pay

## 2020-12-13 DIAGNOSIS — T7840XA Allergy, unspecified, initial encounter: Secondary | ICD-10-CM

## 2020-12-13 DIAGNOSIS — N289 Disorder of kidney and ureter, unspecified: Secondary | ICD-10-CM

## 2020-12-17 ENCOUNTER — Ambulatory Visit (INDEPENDENT_AMBULATORY_CARE_PROVIDER_SITE_OTHER): Payer: Medicare HMO | Admitting: Family Medicine

## 2020-12-17 ENCOUNTER — Encounter: Payer: Self-pay | Admitting: Family Medicine

## 2020-12-17 ENCOUNTER — Other Ambulatory Visit: Payer: Self-pay

## 2020-12-17 VITALS — BP 134/80 | HR 74 | Temp 98.0°F | Ht 68.0 in | Wt 232.2 lb

## 2020-12-17 DIAGNOSIS — T450X5A Adverse effect of antiallergic and antiemetic drugs, initial encounter: Secondary | ICD-10-CM | POA: Diagnosis not present

## 2020-12-17 DIAGNOSIS — T8089XA Other complications following infusion, transfusion and therapeutic injection, initial encounter: Secondary | ICD-10-CM | POA: Diagnosis not present

## 2020-12-17 MED ORDER — PREDNISONE 20 MG PO TABS: ORAL_TABLET | ORAL | 0 refills | Status: DC

## 2020-12-17 NOTE — Progress Notes (Signed)
Subjective:    Patient ID: [name], male    DOB: Sep 06, 1940, 80 y.o.   MRN: [id number]  July 21, the patient went to the emergency room with an allergic reaction.  He states that he was at a [place].  He had country style steak with gravy.  He had mashed potatoes with gravy.  He had some onions and some fried squash.  Shortly after leaving the restaurant he developed hives all over his legs bilaterally his chest and his arms.  He started developing trouble breathing and felt like his lips and tongue were swelling.  He went to the emergency room for an allergic reaction.  He is here today for follow-up.  Since the 21st he has not had any additional beef.  He has eaten pork with no issues.  He denies any other ingestions or exposures on July 21 that would explain the allergic reaction.  Past Medical History:  Diagnosis Date   Abnormal exercise myocardial perfusion study    03/2018- 38% ef, see report   Arthritis    Chronic kidney disease    Congestive heart failure (CHF) ([place]) 5/17   EF 35-40% [name]   Coronary artery disease    a. 1995 s/p CABG x 3 (VG->RCA, LIMA->LAD, VG->OM);  b. 07/2008 Inf MI/Cath/PCI: VG->RCA 99 - treated with Taxus DES (32m), LIMA and VG->OM patent, LAD 100, LCX 100, RCA 60d, EF 50%;  c. 09/2008 PCI native RCA  w/ 2.5x23 Xience DES, VG->RCA stent patent;  c. 05/2010 Cath: Native 3VD with 3/3 patent grafts, native RCA w 80% ISR prox to graft insertion-->Med Rx.   DOE (dyspnea on exertion)    ED (erectile dysfunction)    GERD (gastroesophageal reflux disease)    occ   Gout    H/O hiatal hernia    Hyperlipidemia    Hypertension    Myocardial infarction ([place]    Neuropathy    toes   Obesity    RBBB (right bundle branch block)    Stenosis of right internal carotid artery    50-69% (2014)   [place] II diabetes [place]    Umbilical hernia    a. s/p repair.   Vertigo     Past Surgical History:  Procedure Laterality Date   ANKLE  ARTHROSCOPY WITH DRILLING/MICROFRACTURE Left 04/13/2013   Procedure: LEFT ANKLE ARTHROSCOPY WITH EXTENSIVE DEBRIEDMENT;  Surgeon: JWylene Simmer MD;  [facility]  Service: Orthopedics;  Laterality: Left;   ANKLE SURGERY Left 08/18/2013   DR HEWITT   CARDIAC CATHETERIZATION  2000   stents x3   CARDIAC CATHETERIZATION  02/15/2014   Procedure: LEFT HEART CATH AND CORS/GRAFTS ANGIOGRAPHY;  Surgeon: JLaverda Page MD;  [facility];  Service: Cardiovascular;;   COLONOSCOPY     CORONARY ARTERY BYPASS GRAFT  1995   LIMA to LAD, SVG to RCA, SVG to OM.    CORONARY STENT INTERVENTION N/A 08/12/2020   Procedure: CORONARY STENT INTERVENTION;  Surgeon: PNigel Mormon MD;  [facility];  Service: Cardiovascular;  Laterality: N/A;   LEFT HEART CATH AND CORS/GRAFTS ANGIOGRAPHY N/A 08/12/2020   Procedure: LEFT HEART CATH AND CORS/GRAFTS ANGIOGRAPHY;  Surgeon: PNigel Mormon MD;  [facility];  Service: Cardiovascular;  Laterality: N/A;   TOTAL ANKLE ARTHROPLASTY Left 08/17/2013   Procedure: LEFT TOTAL ANKLE REPLACEMENT WITH POSSIBLE GASTROC RECESSION ;  Surgeon: JWylene Simmer MD;  [place] [facility]  Service:  Orthopedics;  Laterality: Left;   UMBILICAL HERNIA REPAIR  12/2007   Current Outpatient Medications on File Prior to Visit  Medication Sig Dispense Refill   acetaminophen (TYLENOL) 500 MG tablet Take 500 mg by mouth every 6 (six) hours as needed for mild pain.     aspirin EC 81 MG tablet Take 1 tablet (81 mg total) by mouth daily.     clopidogrel (PLAVIX) 75 MG tablet TAKE 8 TABLETS (600 MG TOTAL) BY MOUTH ONCE FOR 1 DOSE TONIGHT THEN TAKE 1 TABLET DAILY (Patient taking differently: Take 75 mg by mouth daily.) 38 tablet 0   EPINEPHrine 0.3 mg/0.3 mL IJ SOAJ injection Inject 0.3 mg into the muscle as needed for anaphylaxis. 1 each 0   metFORMIN (GLUCOPHAGE-XR) 500 MG 24 hr tablet Take 1,000 mg by mouth daily with breakfast.      metoprolol succinate (TOPROL-XL) 25 MG 24 hr tablet Take 1/2 tablet (12.5 mg total) by mouth daily. 30 tablet 1   nitroGLYCERIN (NITROSTAT) 0.4 MG SL tablet Place 0.4 mg under the tongue every 5 (five) minutes as needed for chest pain.     rosuvastatin (CRESTOR) 40 MG tablet TAKE 1 TABLET EVERY DAY 90 tablet 0   tamsulosin (FLOMAX) 0.4 MG CAPS capsule Take 0.4 mg by mouth.     No current facility-administered medications on file prior to visit.   Allergies  Allergen Reactions   Brilinta [Ticagrelor] Shortness Of Breath   Crestor [Rosuvastatin Calcium] Other (See Comments)    Muscle pain- tolerating this 2022, however   Lipitor [Atorvastatin] Other (See Comments)    Intolerable muscle pain all over    Social History   Socioeconomic History   Marital status: Married    Spouse name: Not on file   Number of children: 4   Years of education: Not on file   Highest education level: Not on file  Occupational History   Not on file  Tobacco Use   Smoking status: Former    Packs/day: 1.00    Years: 9.00    Pack years: 9.00    Types: Cigarettes    Quit date: 10/31/1970    Years since quitting: 50.1   Smokeless tobacco: Former    Types: Chew   Tobacco comments:    chewed for 2 years  Vaping Use   Vaping Use: Never used  Substance and Sexual Activity   Alcohol use: Not Currently    Comment: occ wine last 6 months   Drug use: No   Sexual activity: Yes  Other Topics Concern   Not on file  Social History Narrative   Lives in [place] with wife.  Retired.   Social Determinants of Health   Financial Resource Strain: Low Risk    Difficulty of Paying Living Expenses: Not very hard  Food Insecurity: Not on file  Transportation Needs: Not on file  Physical Activity: Not on file  Stress: Not on file  Social Connections: Not on file  Intimate Partner Violence: Not on file     Review of Systems  All other systems reviewed and are negative.     Objective:   Physical  Exam Vitals reviewed.  Constitutional:      General: He is not in acute distress.    Appearance: He is obese. He is not ill-appearing or toxic-appearing.  Cardiovascular:     Rate and Rhythm: Normal rate and regular rhythm.     Heart sounds: No murmur heard.   No friction rub. No gallop.  Pulmonary:     Effort: Pulmonary effort is normal. No respiratory distress.     Breath sounds: No stridor. No wheezing or rhonchi.  Abdominal:     General: Abdomen is flat. Bowel sounds are normal. There is no distension.     Palpations: Abdomen is soft.     Tenderness: There is no abdominal tenderness. There is no guarding.  Musculoskeletal:     Right lower leg: No edema.     Left lower leg: No edema.  Skin:    Findings: No erythema or rash.  Neurological:     Mental Status: He is alert.          Assessment & Plan:  Allergy injection reaction, initial encounter - Plan: Alpha-Gal Panel At this point, I am not sure what causes allergic reaction.  He is requesting to see an allergist which I believe is entirely appropriate.  However based on his history I am concerned about possible alpha gal allergy.  Therefore we will check an alpha gal panel.  I advised the patient to avoid mammalian meat until we have the results of his lab test back.  Also instructed him on how to react to his next allergic reaction.  If he develops hives he can take Benadryl 50 mg every 4 hours and also sent in a prednisone taper pack for hives.  If he develops any swelling in his lips or tongue or trouble breathing he is to use an EpiPen which he has at home.

## 2020-12-19 ENCOUNTER — Other Ambulatory Visit: Payer: Self-pay | Admitting: Family Medicine

## 2020-12-20 ENCOUNTER — Telehealth (HOSPITAL_COMMUNITY): Payer: Self-pay

## 2020-12-20 LAB — ALPHA-GAL PANEL
Beef IgE: 2.07 kU/L — ABNORMAL HIGH (ref <0.35)
Class: 1
Class: 2
Class: 2
Galactose-alpha-1,3-galactose IgE: 11.7 kU/L — ABNORMAL HIGH (ref <0.10)
LAMB/MUTTON IGE: 0.57 kU/L — ABNORMAL HIGH (ref <0.35)
Pork IgE: 2 kU/L — ABNORMAL HIGH (ref <0.35)

## 2020-12-20 NOTE — Telephone Encounter (Signed)
Pt is not interested in the cardiac rehab program. Closed referral 

## 2020-12-24 NOTE — Progress Notes (Signed)
Spoke with pt and notified of results per Dr. Mannam.  Pt verbalized understanding and denied any questions. 

## 2021-01-08 ENCOUNTER — Ambulatory Visit (INDEPENDENT_AMBULATORY_CARE_PROVIDER_SITE_OTHER): Payer: Medicare HMO | Admitting: Pulmonary Disease

## 2021-01-08 ENCOUNTER — Encounter: Payer: Self-pay | Admitting: Pulmonary Disease

## 2021-01-08 ENCOUNTER — Other Ambulatory Visit: Payer: Self-pay

## 2021-01-08 VITALS — BP 124/66 | HR 77 | Temp 97.0°F | Ht 68.0 in | Wt 231.0 lb

## 2021-01-08 DIAGNOSIS — J849 Interstitial pulmonary disease, unspecified: Secondary | ICD-10-CM | POA: Diagnosis not present

## 2021-01-08 LAB — PULMONARY FUNCTION TEST
DL/VA % pred: 81 %
DL/VA: 3.22 ml/min/mmHg/L
DLCO cor % pred: 62 %
DLCO cor: 14.67 ml/min/mmHg
DLCO unc % pred: 61 %
DLCO unc: 14.45 ml/min/mmHg
FEF 25-75 Pre: 4.34 L/sec
FEF2575-%Pred-Pre: 223 %
FEV1-%Pred-Pre: 102 %
FEV1-Pre: 2.81 L
FEV1FVC-%Pred-Pre: 122 %
FEV6-%Pred-Pre: 88 %
FEV6-Pre: 3.16 L
FEV6FVC-%Pred-Pre: 106 %
FVC-%Pred-Pre: 83 %
FVC-Pre: 3.17 L
Pre FEV1/FVC ratio: 89 %
Pre FEV6/FVC Ratio: 100 %
RV % pred: 81 %
RV: 2.02 L
TLC % pred: 78 %
TLC: 5.22 L

## 2021-01-08 NOTE — Progress Notes (Signed)
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  IPF was suspected and he was supposed to get ILD panel for further work-up but did not follow through.  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 Complains of chronic dyspnea on exertion over the past 2 years, occasional cough with mucus production.  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  Follows with Chris [name]  Interim history: Here for review of CT and PFTs Continues to have dyspnea on exertion which is unchanged from prior visit  Outpatient Encounter Medications as of 01/08/2021  Medication Sig   acetaminophen (TYLENOL) 500 MG tablet Take 500 mg by mouth every 6 (six) hours as needed for mild pain.   aspirin EC 81 MG tablet Take 1 tablet (81 mg total) by mouth daily.   clopidogrel (PLAVIX) 75 MG tablet TAKE 8 TABLETS (600 MG TOTAL) BY MOUTH ONCE FOR 1 DOSE TONIGHT THEN TAKE 1 TABLET DAILY (Patient taking differently: Take 75 mg by mouth daily.)   EPINEPHrine 0.3 mg/0.3 mL IJ SOAJ injection Inject 0.3 mg into the muscle as needed for anaphylaxis.   metFORMIN (GLUCOPHAGE-XR)  500 MG 24 hr tablet Take 1,000 mg by mouth daily with breakfast.   metoprolol succinate (TOPROL-XL) 25 MG 24 hr tablet Take 1/2 tablet (12.5 mg total) by mouth daily.   nitroGLYCERIN (NITROSTAT) 0.4 MG SL tablet Place 0.4 mg under the tongue every 5 (five) minutes as needed for chest pain.   rosuvastatin (CRESTOR) 40 MG tablet TAKE 1 TABLET EVERY DAY   tamsulosin (FLOMAX) 0.4 MG CAPS capsule TAKE 1 CAPSULE(0.4 MG) BY MOUTH AT BEDTIME AS NEEDED   [DISCONTINUED] predniSONE (DELTASONE) 20 MG tablet 3 tabs poqday 1-2, 2 tabs poqday 3-4, 1 tab poqday 5-6   No facility-administered encounter medications on file as of 01/08/2021.   Physical Exam: Blood pressure 124/66, pulse 77, temperature (!) 97 F (36.1 C), temperature source Oral, height '5\' 8"'$  (1.727 m), weight 231 lb (104.8 kg), SpO2 98 %. 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  CTD serologies positive for SCL 100 antibody which is likely nonspecific  Reviewed findings in detail with patient and wife today.   Irrespective of the etiology [IPF versus asbestosis] the fibrosis appears progressive and he is a candidate for antifibrotic therapy.  We discussed therapies in detail today and he has elected not to start any of these.  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

## 2021-01-08 NOTE — Progress Notes (Signed)
Spirometry/DLCO and N2 performed. Patient passed out during N2. No post spiromety.

## 2021-01-08 NOTE — Patient Instructions (Addendum)
I have reviewed your CT scan which shows slight worsening of scarring in the lung Per our discussion today we will not start any treatment but continue to monitor Follow-up in clinic in 6 months

## 2021-01-08 NOTE — Patient Instructions (Signed)
Spirometry/DLCO and N2 performed.

## 2021-01-09 ENCOUNTER — Telehealth: Payer: Self-pay | Admitting: Pharmacist

## 2021-01-09 ENCOUNTER — Encounter: Payer: Self-pay | Admitting: Pulmonary Disease

## 2021-01-09 NOTE — Progress Notes (Addendum)
Chronic Care Management Pharmacy Assistant   Name: Chris [name].  MRN: [id number] DOB: [birth year]  Reason for Encounter: Disease State For HTN.   Conditions to be addressed/monitored: CAD, HTN,CVA,CHF,TYPE 2 DM,HLD.  Recent office visits:  12/17/20 Dr Dennard Schaumann For follow-up STARTED Prednisone 20 mg pack.   Recent consult visits:  01/08/21 Marshell Garfinkel, MD. For interstitial pulmonary disease. STOPPED Prednisone 20 mg pack 12/12/20 Cardiology [name], [name], PA-C. For follow-up. No medication changes.   Hospital visits:  12/12/20 [facility] Emergency Department (4 Hours) For allergic reaction. Per note: Epinephrine administered.   Medications: Outpatient Encounter Medications as of 01/09/2021  Medication Sig   acetaminophen (TYLENOL) 500 MG tablet Take 500 mg by mouth every 6 (six) hours as needed for mild pain.   aspirin EC 81 MG tablet Take 1 tablet (81 mg total) by mouth daily.   clopidogrel (PLAVIX) 75 MG tablet TAKE 8 TABLETS (600 MG TOTAL) BY MOUTH ONCE FOR 1 DOSE TONIGHT THEN TAKE 1 TABLET DAILY (Patient taking differently: Take 75 mg by mouth daily.)   EPINEPHrine 0.3 mg/0.3 mL IJ SOAJ injection Inject 0.3 mg into the muscle as needed for anaphylaxis.   metFORMIN (GLUCOPHAGE-XR) 500 MG 24 hr tablet Take 1,000 mg by mouth daily with breakfast.   metoprolol succinate (TOPROL-XL) 25 MG 24 hr tablet Take 1/2 tablet (12.5 mg total) by mouth daily.   nitroGLYCERIN (NITROSTAT) 0.4 MG SL tablet Place 0.4 mg under the tongue every 5 (five) minutes as needed for chest pain.   rosuvastatin (CRESTOR) 40 MG tablet TAKE 1 TABLET EVERY DAY   tamsulosin (FLOMAX) 0.4 MG CAPS capsule TAKE 1 CAPSULE(0.4 MG) BY MOUTH AT BEDTIME AS NEEDED   No facility-administered encounter medications on file as of 01/09/2021.   Reviewed chart prior to disease state call. Spoke with patient regarding BP  Recent Office Vitals: BP Readings from Last 3 Encounters:  01/08/21 124/66   12/17/20 134/80  12/13/20 129/88   Pulse Readings from Last 3 Encounters:  01/08/21 77  12/17/20 74  12/13/20 77    Wt Readings from Last 3 Encounters:  01/08/21 231 lb (104.8 kg)  12/17/20 232 lb 3.2 oz (105.3 kg)  12/12/20 234 lb (106.1 kg)     Kidney Function Lab Results  Component Value Date/Time   CREATININE 1.80 (H) 12/12/2020 10:40 PM   CREATININE 1.52 (H) 09/23/2020 04:25 AM   CREATININE 1.23 (H) 08/30/2020 02:56 PM   CREATININE 1.44 (H) 08/01/2019 08:35 AM   GFRNONAA 38 (L) 12/12/2020 10:40 PM   GFRNONAA 56 (L) 08/30/2020 02:56 PM   GFRAA 65 08/30/2020 02:56 PM    BMP Latest Ref Rng & Units 12/12/2020 09/23/2020 09/05/2020  Glucose 70 - 99 mg/dL 151(H) 154(H) 95  BUN 8 - 23 mg/dL '18 12 16  '$ Creatinine 0.61 - 1.24 mg/dL 1.80(H) 1.52(H) 1.39(H)  BUN/Creat Ratio 6 - 22 (calc) - - -  Sodium 135 - 145 mmol/L 135 140 137  Potassium 3.5 - 5.1 mmol/L 3.8 4.0 3.9  Chloride 98 - 111 mmol/L 109 107 106  CO2 22 - 32 mmol/L 18(L) 24 24  Calcium 8.9 - 10.3 mg/dL 8.8(L) 9.0 8.9    Current antihypertensive regimen:  Metoprolol 25 mg 1/2 tablet by mouth daily.  How often are you checking your Blood Pressure? Patient stated daily  Current home BP readings: Patient stated his most recent blood pressure was 85/49, 107/50   What recent interventions/DTPs have been made by any provider to  improve Blood Pressure control since last CPP Visit: None.   Any recent hospitalizations or ED visits since last visit with CPP? Patient stated no.   What diet changes have been made to improve Blood Pressure Control?  Patient stated he has a good appetite and eats well rounded diet. We dicussed his fluid intake and he stated he drinks enough water.   What exercise is being done to improve your Blood Pressure Control?  Patient stated he does some walking.   Adherence Review: Is the patient currently on ACE/ARB medication? N/A  Does the patient have >5 day gap between last estimated fill  dates? N/A.   Star Rating Drugs: Rosuvastatin 40 mg 90 DS 12/09/20, Metformin 500 mg 90 DS 11/07/20.   Patient stated she passed out yesterday doing a breathing test at the [place] [place] Pharmacist Assistant [phone]  10 minutes spent in review, coordination, and documentation.  Reviewed by: Beverly Milch, PharmD Clinical Pharmacist [phone]

## 2021-01-21 ENCOUNTER — Ambulatory Visit: Payer: Medicare HMO | Admitting: Student

## 2021-01-21 ENCOUNTER — Encounter: Payer: Self-pay | Admitting: Student

## 2021-01-21 ENCOUNTER — Other Ambulatory Visit: Payer: Self-pay

## 2021-01-21 VITALS — BP 132/78 | HR 96 | Temp 97.3°F | Resp 17 | Ht 68.0 in | Wt 230.6 lb

## 2021-01-21 DIAGNOSIS — I951 Orthostatic hypotension: Secondary | ICD-10-CM

## 2021-01-21 DIAGNOSIS — R0609 Other forms of dyspnea: Secondary | ICD-10-CM | POA: Diagnosis not present

## 2021-01-21 DIAGNOSIS — I25118 Atherosclerotic heart disease of native coronary artery with other forms of angina pectoris: Secondary | ICD-10-CM | POA: Diagnosis not present

## 2021-01-21 DIAGNOSIS — R0600 Dyspnea, unspecified: Secondary | ICD-10-CM

## 2021-01-21 NOTE — Progress Notes (Signed)
Primary Physician/Referring:  Susy Frizzle, MD  Patient ID: Chris Reasons., male    DOB: 1940-10-11, 80 y.o.   MRN: [id number]  Chief Complaint  Patient presents with   Chest Pain    6 WEEKS   Coronary Artery Disease   Dizziness    HPI:    Chris Picone.  is a 80 y.o. Caucasian male patient with hypertension, hyperlipidemia, diabetes mellitus diagnosed in 2018, CAD,hx of MI with CABG S/P multiple coronary interventions,  H/O stroke in past without residual deficits, chronic systolic and diastolic CHF with EF 123456 and asymptomatic carotid artery stenosis, IPF.  Patient underwent PCI to high-grade stenosis of the SVG to RCA in March 2022.  Patient presents for 6-week follow-up of CAD and angina.  He reports he has had no recurrence of anginal symptoms since last visit.  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  Patient's primary complaint today is fatigue as well as dizziness upon standing.  Patient reports several episodes of presyncope when he goes from laying or sitting to standing with lightheadedness lasting about 1 minute.  Denies chest pain, palpitations, syncope.  He continues to have dyspnea on exertion which is stable.  Past Medical History:  Diagnosis Date   Abnormal exercise myocardial perfusion study    03/2018- 38% ef, see report   Arthritis    Chronic kidney disease    Congestive heart failure (CHF) ([place]) 5/17   EF 35-40% [name]   Coronary artery disease    a. 1995 s/p CABG x 3 (VG->RCA, LIMA->LAD, VG->OM);  b. 07/2008 Inf MI/Cath/PCI: VG->RCA 99 - treated with Taxus DES (26m), LIMA and VG->OM patent, LAD 100, LCX 100, RCA 60d, EF 50%;  c. 09/2008 PCI native RCA  w/ 2.5x23 Xience DES, VG->RCA stent patent;  c. 05/2010 Cath: Native 3VD with 3/3 patent grafts, native RCA w 80% ISR prox to graft insertion-->Med Rx.   DOE (dyspnea  on exertion)    ED (erectile dysfunction)    GERD (gastroesophageal reflux disease)    occ   Gout    H/O hiatal hernia    Hyperlipidemia    Hypertension    Myocardial infarction ([facility]    Neuropathy    toes   Obesity    RBBB (right bundle branch block)    Stenosis of right internal carotid artery    50-69% (2014)   Type II diabetes mellitus ([facility]    Umbilical hernia    a. s/p repair.   Vertigo    Past Surgical History:  Procedure Laterality Date   ANKLE ARTHROSCOPY WITH DRILLING/MICROFRACTURE Left 04/13/2013   Procedure: LEFT ANKLE ARTHROSCOPY WITH EXTENSIVE DEBRIEDMENT;  Surgeon: JWylene Simmer MD;  [facility]  Service: Orthopedics;  Laterality: Left;   ANKLE SURGERY Left 08/18/2013   DR HEWITT   CARDIAC CATHETERIZATION  2000   stents x3   CARDIAC CATHETERIZATION  02/15/2014   Procedure: LEFT HEART CATH AND CORS/GRAFTS ANGIOGRAPHY;  Surgeon: JLaverda Page MD;  [facility];  Service: Cardiovascular;;   COLONOSCOPY     CORONARY ARTERY BYPASS GRAFT  1995   LIMA to LAD, SVG to RCA, SVG to OM.    CORONARY STENT INTERVENTION N/A 08/12/2020   Procedure: CORONARY STENT INTERVENTION;  Surgeon: PNigel Mormon MD;  [facility];  Service: Cardiovascular;  Laterality: N/A;  LEFT HEART CATH AND CORS/GRAFTS ANGIOGRAPHY N/A 08/12/2020   Procedure: LEFT HEART CATH AND CORS/GRAFTS ANGIOGRAPHY;  Surgeon: Nigel Mormon, MD;  Location: [facility];  Service: Cardiovascular;  Laterality: N/A;   TOTAL ANKLE ARTHROPLASTY Left 08/17/2013   Procedure: LEFT TOTAL ANKLE REPLACEMENT WITH POSSIBLE GASTROC RECESSION ;  Surgeon: Wylene Simmer, MD;  Location: [facility];  Service: Orthopedics;  Laterality: Left;   UMBILICAL HERNIA REPAIR  12/2007   Family History  Problem Relation Age of Onset   Aneurysm Mother    Heart disease Father    Heart attack Father    Stroke Brother     Social History   Tobacco Use   Smoking status: Former     Packs/day: 1.00    Years: 9.00    Pack years: 9.00    Types: Cigarettes    Quit date: 10/31/1970    Years since quitting: 50.2   Smokeless tobacco: Former    Types: Chew   Tobacco comments:    chewed for 2 years in his 20's  Substance Use Topics   Alcohol use: Not Currently    Comment: occ wine last 6 months   ROS  Review of Systems  Constitutional: Negative for malaise/fatigue and weight gain.  Cardiovascular:  Positive for dyspnea on exertion (stable) and near-syncope. Negative for chest pain, claudication, leg swelling, orthopnea, palpitations, paroxysmal nocturnal dyspnea and syncope.  Respiratory:  Negative for shortness of breath.   Musculoskeletal:  Positive for arthritis.  Gastrointestinal:  Negative for melena.  Neurological:  Positive for dizziness.  Objective  Blood pressure 132/78, pulse 96, temperature (!) 97.3 F (36.3 C), temperature source Temporal, resp. rate 17, height '5\' 8"'$  (1.727 m), weight 230 lb 9.6 oz (104.6 kg), SpO2 96 %.  Vitals with BMI 01/21/2021 01/08/2021 12/17/2020  Height '5\' 8"'$  '5\' 8"'$  '5\' 8"'$   Weight 230 lbs 10 oz 231 lbs 232 lbs 3 oz  BMI 35.07 123456 [id number]  Systolic Q000111Q [id number] Q000111Q  Diastolic 78 66 80  Pulse 96 77 74   Orthostatic VS for the past 72 hrs (Last 3 readings):  Orthostatic BP Patient Position BP Location Cuff Size Orthostatic Pulse  01/21/21 1358 93/64 Standing Left Arm Normal 89  01/21/21 1357 113/71 Sitting Left Arm Normal 71  01/21/21 1356 133/79 Supine Left Arm Normal 72    Physical Exam Vitals reviewed.  Constitutional:      General: He is not in acute distress.    Appearance: He is well-developed. He is obese.  Neck:     Vascular: No JVD.  Cardiovascular:     Rate and Rhythm: Normal rate and regular rhythm.     Pulses:          Carotid pulses are 2+ on the right side and 2+ on the left side.    Heart sounds: Normal heart sounds. No murmur heard.   No gallop.     Comments: No JVD, no leg edema.  Fem and Pop pulse difficult to  feel due to body habitus.  DP  Normal and PT absent bilateral Pulmonary:     Effort: Pulmonary effort is normal.     Breath sounds: Rales (left worse than right coarse crackles) present.  Musculoskeletal:     Right lower leg: No edema.     Left lower leg: No edema.  Neurological:     Mental Status: He is alert.   Laboratory examination:   Recent Labs    05/15/20 1000 08/12/20 1253 08/30/20 1456 09/04/20  1603 09/05/20 0155 09/23/20 0425 12/12/20 2240  NA 141   < > 139   < > 137 140 135  K 4.7   < > 4.6   < > 3.9 4.0 3.8  CL 104   < > 104   < > 106 107 109  CO2 21   < > 25   < > 24 24 18*  GLUCOSE 134*   < > 92   < > 95 154* 151*  BUN 13   < > 16   < > '16 12 18  '$ CREATININE 1.24   < > 1.23*   < > 1.39* 1.52* 1.80*  CALCIUM 9.6   < > 9.8   < > 8.9 9.0 8.8*  GFRNONAA 56*   < > 56*   < > 52* 47* 38*  GFRAA 64  --  65  --   --   --   --    < > = values in this interval not displayed.   CrCl cannot be calculated (Patient's most recent lab result is older than the maximum 21 days allowed.).  CMP Latest Ref Rng & Units 12/12/2020 09/23/2020 09/05/2020  Glucose 70 - 99 mg/dL 151(H) 154(H) 95  BUN 8 - 23 mg/dL '18 12 16  '$ Creatinine 0.61 - 1.24 mg/dL 1.80(H) 1.52(H) 1.39(H)  Sodium 135 - 145 mmol/L 135 140 137  Potassium 3.5 - 5.1 mmol/L 3.8 4.0 3.9  Chloride 98 - 111 mmol/L 109 107 106  CO2 22 - 32 mmol/L 18(L) 24 24  Calcium 8.9 - 10.3 mg/dL 8.8(L) 9.0 8.9  Total Protein 6.5 - 8.1 g/dL - 6.9 -  Total Bilirubin 0.3 - 1.2 mg/dL - 0.6 -  Alkaline Phos 38 - 126 U/L - 30(L) -  AST 15 - 41 U/L - 24 -  ALT 0 - 44 U/L - 16 -   CBC Latest Ref Rng & Units 12/12/2020 09/23/2020 09/04/2020  WBC 4.0 - 10.5 K/uL 12.7(H) 10.2 8.4  Hemoglobin 13.0 - 17.0 g/dL 14.1 12.8(L) 13.2  Hematocrit 39.0 - 52.0 % 44.3 39.1 40.3  Platelets 150 - 400 K/uL 276 228 230   Lipid Panel Recent Labs    05/15/20 1000 08/12/20 1253 08/13/20 0306  CHOL 183 126 110  TRIG 344* 220* 230*  LDLCALC 87 40 29  VLDL   --  44* 46*  HDL 40 42 35*  CHOLHDL  --  3.0 3.1    HEMOGLOBIN A1C Lab Results  Component Value Date   HGBA1C 6.4 (H) 08/12/2020   MPG 136.98 08/12/2020   TSH No results for input(s): TSH in the last 8760 hours. Allergies   Allergies  Allergen Reactions   Brilinta [Ticagrelor] Shortness Of Breath   Crestor [Rosuvastatin Calcium] Other (See Comments)    Muscle pain- tolerating this 2022, however   Lipitor [Atorvastatin] Other (See Comments)    Intolerable muscle pain all over     Medications Prior to Visit:   Outpatient Medications Prior to Visit  Medication Sig Dispense Refill   acetaminophen (TYLENOL) 500 MG tablet Take 500 mg by mouth every 6 (six) hours as needed for mild pain.     aspirin EC 81 MG tablet Take 1 tablet (81 mg total) by mouth daily.     clopidogrel (PLAVIX) 75 MG tablet TAKE 8 TABLETS (600 MG TOTAL) BY MOUTH ONCE FOR 1 DOSE TONIGHT THEN TAKE 1 TABLET DAILY (Patient taking differently: Take 75 mg by mouth daily.) 38 tablet 0   EPINEPHrine  0.3 mg/0.3 mL IJ SOAJ injection Inject 0.3 mg into the muscle as needed for anaphylaxis. 1 each 0   metFORMIN (GLUCOPHAGE-XR) 500 MG 24 hr tablet Take 1,000 mg by mouth daily with breakfast.     nitroGLYCERIN (NITROSTAT) 0.4 MG SL tablet Place 0.4 mg under the tongue every 5 (five) minutes as needed for chest pain.     rosuvastatin (CRESTOR) 40 MG tablet TAKE 1 TABLET EVERY DAY 90 tablet 0   tamsulosin (FLOMAX) 0.4 MG CAPS capsule TAKE 1 CAPSULE(0.4 MG) BY MOUTH AT BEDTIME AS NEEDED 30 capsule 3   metoprolol succinate (TOPROL-XL) 25 MG 24 hr tablet Take 1/2 tablet (12.5 mg total) by mouth daily. 30 tablet 1   No facility-administered medications prior to visit.   Final Medications at End of Visit    Current Meds  Medication Sig   acetaminophen (TYLENOL) 500 MG tablet Take 500 mg by mouth every 6 (six) hours as needed for mild pain.   aspirin EC 81 MG tablet Take 1 tablet (81 mg total) by mouth daily.   clopidogrel  (PLAVIX) 75 MG tablet TAKE 8 TABLETS (600 MG TOTAL) BY MOUTH ONCE FOR 1 DOSE TONIGHT THEN TAKE 1 TABLET DAILY (Patient taking differently: Take 75 mg by mouth daily.)   EPINEPHrine 0.3 mg/0.3 mL IJ SOAJ injection Inject 0.3 mg into the muscle as needed for anaphylaxis.   metFORMIN (GLUCOPHAGE-XR) 500 MG 24 hr tablet Take 1,000 mg by mouth daily with breakfast.   nitroGLYCERIN (NITROSTAT) 0.4 MG SL tablet Place 0.4 mg under the tongue every 5 (five) minutes as needed for chest pain.   rosuvastatin (CRESTOR) 40 MG tablet TAKE 1 TABLET EVERY DAY   tamsulosin (FLOMAX) 0.4 MG CAPS capsule TAKE 1 CAPSULE(0.4 MG) BY MOUTH AT BEDTIME AS NEEDED   [DISCONTINUED] metoprolol succinate (TOPROL-XL) 25 MG 24 hr tablet Take 1/2 tablet (12.5 mg total) by mouth daily.    Radiology:   CT angio chest 07/25/2019: 1. No evidence of pulmonary embolism. 2. Scattered ground-glass density throughout both lungs with increased central and subpleural reticulation and areas of mosaic attenuation. No frank honeycombing or significant bronchiectasis. Differential considerations include chronic hypersensitivity pneumonitis or idiopathic interstitial pneumonia such as NSIP. Pulmonary consultation is suggested, as well as high-resolution chest CT follow-up in 6 months. 3. 5 mm pulmonary nodule along the right major fissure. No follow-up needed if patient is low-risk. Non-contrast chest CT can be considered in 12 months if patient is high-risk.  4.  Aortic atherosclerosis (ICD10-I70.0).  CT Chest 12/04/2020:  1. 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 Findings are consistent with UIP per consensus guidelines: Diagnosis of Idiopathic Pulmonary Fibrosis: An Official ATS/ERS/JRS/ALAT Clinical Practice Guideline. [place], Iss 5, [phone], Jan 23 2017. 2. Stable borderline mild cardiomegaly. 3. Large hiatal hernia. 4. Aortic  Atherosclerosis (ICD10-I70.0).  Cardiac Studies:   Lexiscan myoview stress test 04/11/2018: 1. Lexiscan stress test was performed. Exercise capacity was not assessed. No stress symptoms reported. Blood pressure was normal.  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  Stress EKG is non diagnostic for ischemia as it is a pharmacologic stress. 2. The overall quality of the study is good.  Left ventricular cavity is noted to be normal on the rest and stress studies.  Gated SPECT imaging demonstrates hypokinesis of the basal inferior, basal inferolateral, mid inferior,  mid inferolateral, apical inferior and apical lateral myocardial wall(s).  The left ventricular ejection fraction was calculated or visually estimated to be 38%. LSPECT images reveal a large sized, medium intensity, minimally reversible perfusion defect suggestive of large infarct with minimal peri infarct ischemia in LCx/PDA territory.   3. High risk study.  Overnight oximetry test 10/11/2019  Desaturation below 88% 17.6 minutes.  Left heart catheterization 08/12/2020: LM: Normal LAD: 100% occlusion after D1 LCx: 100% prox occlusion RCA: 100% mid occlusion with ISR LIMA-LAD: Patent SVG-OM3: Patent SVG-RCA: Patent prox-mid stent with 40% lumen loss ( 09/2008 PCI with 38 mm Taxus DES)                90% and 80% lesions in distal part of SVG-RCA                90% lesion at SVG-to RCA anastomosis   LVEF 40-45%. LVEDP 18 mmHg   Successful stenting of SVG to RCA, distal segment with 3.5 x 38 mm and anastamosis with 2.5 x 12 mm resolute Onyx DES.  Echocardiogram 11/06/2020:   Left ventricle cavity is normal in size. Moderate concentric hypertrophy of the left ventricle.  Left ventricle regional wall motion findings: Basal inferolateral, Basal inferior, Mid inferolateral and Mid inferior  akinesis. Moderate decrease in global wall motion. Visual EF is 35-40%. Doppler evidence of grade II (pseudonormal) diastolic dysfunction, elevated LAP. Left atrial cavity is moderately dilated by volume. Right ventricle cavity is mildly dilated. Normal right ventricular function. Compared to 08/13/2020, no significant change.  Carotid artery duplex 11/06/2020: Doppler velocity suggests stenosis in the right internal carotid artery (1-15%). Doppler velocity suggests stenosis in the left internal carotid artery (16-49%). Antegrade right vertebral artery flow. Antegrade left vertebral artery flow. Compared to 04/24/2020, right ICA stenosis was 50-69% stenosis. Follow up in one year is appropriate if clinically indicated.   EKG   11/13/2020: Normal sinus rhythm at rate of 64 beats minute, left atrial enlargement, right axis deviation, cannot exclude inferior infarct old.  Right bundle branch block.  No significant change from 08/12/2020. Cnsider pulmonary disease pattern.    Assessment   1. Orthostatic hypotension   2. Coronary artery disease of native artery of native heart with stable angina pectoris ([place])   3. Dyspnea on exertion     No orders of the defined types were placed in this encounter.  Medications Discontinued During This Encounter  Medication Reason   metoprolol succinate (TOPROL-XL) 25 MG 24 hr tablet Side effect (s)     No orders of the defined types were placed in this encounter.    Recommendations:   Chris Milles.  is a 80 y.o. Caucasian male patient with hypertension, hyperlipidemia, diabetes mellitus diagnosed in 2018, CAD, hx of MI with CABG S/P multiple coronary interventions,  H/O stroke in past without residual deficits, chronic systolic and diastolic CHF with EF 123456 and asymptomatic carotid artery stenosis.  He had presented with unstable anginal-like symptoms to our office in March 2022, underwent successful angioplasty to saphenous vein graft to the  right coronary artery.  Patient presents for 6-week follow-up of angina pectoris.  He has had no recurrence of anginal symptoms since last office visit.  However he has been experiencing worsening fatigue as well as a few episodes of near syncope upon standing.  In the office today his orthostatic.  We will therefore discontinue metoprolol and monitor closely.  Encourage patient to monitor blood pressure on a daily basis at home and  bring a written log with him to his office visit.  Patient has previously been recommended initiation of valsartan or Entresto as well as Imdur, however patient has been hesitant to make medication changes.  We will hold off on additional changes at today's office visit.  Follow-up in 3 weeks, sooner if needed, for orthostatic hypotension.   Alethia Berthold, PA-C 01/21/2021, 3:47 PM Office: [phone]

## 2021-01-23 ENCOUNTER — Ambulatory Visit: Payer: Medicare HMO | Admitting: Student

## 2021-02-07 ENCOUNTER — Other Ambulatory Visit: Payer: Self-pay

## 2021-02-07 ENCOUNTER — Ambulatory Visit (INDEPENDENT_AMBULATORY_CARE_PROVIDER_SITE_OTHER): Payer: Medicare HMO

## 2021-02-07 VITALS — Ht 68.0 in | Wt 231.0 lb

## 2021-02-07 DIAGNOSIS — Z0000 Encounter for general adult medical examination without abnormal findings: Secondary | ICD-10-CM

## 2021-02-07 NOTE — Patient Instructions (Signed)
Chris [name] , Thank you for taking time to come for your Medicare Wellness Visit. I appreciate your ongoing commitment to your health goals. Please review the following plan we discussed and let me know if I can assist you in the future.   Screening recommendations/referrals: Colonoscopy: 12/31/2011, Repeat in 10 years  Recommended yearly ophthalmology/optometry visit for glaucoma screening and checkup Recommended yearly dental visit for hygiene and checkup  Vaccinations: Influenza vaccine: 01/2021. Repeat annually  Pneumococcal vaccine: Done 08/18/13 11/13/16 Tdap vaccine: Done 11/13/16 Repeat in 10 years  Shingles vaccine: Shingrix discussed. Please contact your pharmacy for coverage information.     Covid-19: Done 08/08/2019 07/09/2019  Advanced directives: Advance directive discussed with you today. Even though you declined this today, please call our office should you change your mind, and we can give you the proper paperwork for you to fill out.   Conditions/risks identified: Aim for 30 minutes of exercise each day, drink 6-8 glasses of water and eat lots of fruits and vegetables.   Next appointment: Follow up in one year for your annual wellness visit.   Preventive Care 79 Years and Older, Male  Preventive care refers to lifestyle choices and visits with your health care provider that can promote health and wellness. What does preventive care include? A yearly physical exam. This is also called an annual well check. Dental exams once or twice a year. Routine eye exams. Ask your health care provider how often you should have your eyes checked. Personal lifestyle choices, including: Daily care of your teeth and gums. Regular physical activity. Eating a healthy diet. Avoiding tobacco and drug use. Limiting alcohol use. Practicing safe sex. Taking low doses of aspirin every day. Taking vitamin and mineral supplements as recommended by your health care provider. What happens during  an annual well check? The services and screenings done by your health care provider during your annual well check will depend on your age, overall health, lifestyle risk factors, and family history of disease. Counseling  Your health care provider may ask you questions about your: Alcohol use. Tobacco use. Drug use. Emotional well-being. Home and relationship well-being. Sexual activity. Eating habits. History of falls. Memory and ability to understand (cognition). Work and work Statistician. Screening  You may have the following tests or measurements: Height, weight, and BMI. Blood pressure. Lipid and cholesterol levels. These may be checked every 5 years, or more frequently if you are over 89 years old. Skin check. Lung cancer screening. You may have this screening every year starting at age 61 if you have a 30-pack-year history of smoking and currently smoke or have quit within the past 15 years. Fecal occult blood test (FOBT) of the stool. You may have this test every year starting at age 19. Flexible sigmoidoscopy or colonoscopy. You may have a sigmoidoscopy every 5 years or a colonoscopy every 10 years starting at age 95. Prostate cancer screening. Recommendations will vary depending on your family history and other risks. Hepatitis C blood test. Hepatitis B blood test. Sexually transmitted disease (STD) testing. Diabetes screening. This is done by checking your blood sugar (glucose) after you have not eaten for a while (fasting). You may have this done every 1-3 years. Abdominal aortic aneurysm (AAA) screening. You may need this if you are a current or former smoker. Osteoporosis. You may be screened starting at age 45 if you are at high risk. Talk with your health care provider about your test results, treatment options, and if necessary, the need  for more tests. Vaccines  Your health care provider may recommend certain vaccines, such as: Influenza vaccine. This is recommended  every year. Tetanus, diphtheria, and acellular pertussis (Tdap, Td) vaccine. You may need a Td booster every 10 years. Zoster vaccine. You may need this after age 68. Pneumococcal 13-valent conjugate (PCV13) vaccine. One dose is recommended after age 51. Pneumococcal polysaccharide (PPSV23) vaccine. One dose is recommended after age 94. Talk to your health care provider about which screenings and vaccines you need and how often you need them. This information is not intended to replace advice given to you by your health care provider. Make sure you discuss any questions you have with your health care provider. Document Released: 06/07/2015 Document Revised: 01/29/2016 Document Reviewed: 03/12/2015 Elsevier Interactive Patient Education  2017 [place] Prevention in the Home Falls can cause injuries. They can happen to people of all ages. There are many things you can do to make your home safe and to help prevent falls. What can I do on the outside of my home? Regularly fix the edges of walkways and driveways and fix any cracks. Remove anything that might make you trip as you walk through a door, such as a raised step or threshold. Trim any bushes or trees on the path to your home. Use bright outdoor lighting. Clear any walking paths of anything that might make someone trip, such as rocks or tools. Regularly check to see if handrails are loose or broken. Make sure that both sides of any steps have handrails. Any raised decks and porches should have guardrails on the edges. Have any leaves, snow, or ice cleared regularly. Use sand or salt on walking paths during winter. Clean up any spills in your garage right away. This includes oil or grease spills. What can I do in the bathroom? Use night lights. Install grab bars by the toilet and in the tub and shower. Do not use towel bars as grab bars. Use non-skid mats or decals in the tub or shower. If you need to sit down in the shower,  use a plastic, non-slip stool. Keep the floor dry. Clean up any water that spills on the floor as soon as it happens. Remove soap buildup in the tub or shower regularly. Attach bath mats securely with double-sided non-slip rug tape. Do not have throw rugs and other things on the floor that can make you trip. What can I do in the bedroom? Use night lights. Make sure that you have a light by your bed that is easy to reach. Do not use any sheets or blankets that are too big for your bed. They should not hang down onto the floor. Have a firm chair that has side arms. You can use this for support while you get dressed. Do not have throw rugs and other things on the floor that can make you trip. What can I do in the kitchen? Clean up any spills right away. Avoid walking on wet floors. Keep items that you use a lot in easy-to-reach places. If you need to reach something above you, use a strong step stool that has a grab bar. Keep electrical cords out of the way. Do not use floor polish or wax that makes floors slippery. If you must use wax, use non-skid floor wax. Do not have throw rugs and other things on the floor that can make you trip. What can I do with my stairs? Do not leave any items on the stairs.  Make sure that there are handrails on both sides of the stairs and use them. Fix handrails that are broken or loose. Make sure that handrails are as long as the stairways. Check any carpeting to make sure that it is firmly attached to the stairs. Fix any carpet that is loose or worn. Avoid having throw rugs at the top or bottom of the stairs. If you do have throw rugs, attach them to the floor with carpet tape. Make sure that you have a light switch at the top of the stairs and the bottom of the stairs. If you do not have them, ask someone to add them for you. What else can I do to help prevent falls? Wear shoes that: Do not have high heels. Have rubber bottoms. Are comfortable and fit you  well. Are closed at the toe. Do not wear sandals. If you use a stepladder: Make sure that it is fully opened. Do not climb a closed stepladder. Make sure that both sides of the stepladder are locked into place. Ask someone to hold it for you, if possible. Clearly mark and make sure that you can see: Any grab bars or handrails. First and last steps. Where the edge of each step is. Use tools that help you move around (mobility aids) if they are needed. These include: Canes. Walkers. Scooters. Crutches. Turn on the lights when you go into a dark area. Replace any light bulbs as soon as they burn out. Set up your furniture so you have a clear path. Avoid moving your furniture around. If any of your floors are uneven, fix them. If there are any pets around you, be aware of where they are. Review your medicines with your doctor. Some medicines can make you feel dizzy. This can increase your chance of falling. Ask your doctor what other things that you can do to help prevent falls. This information is not intended to replace advice given to you by your health care provider. Make sure you discuss any questions you have with your health care provider. Document Released: 03/07/2009 Document Revised: 10/17/2015 Document Reviewed: 06/15/2014 Elsevier Interactive Patient Education  2017 [employer]

## 2021-02-07 NOTE — Progress Notes (Signed)
Subjective:   Chris [name]. is a 80 y.o. male who presents for an Initial Medicare Annual Wellness Visit. Virtual Visit via Telephone Note  I connected with  Chris [name]. on 02/07/21 at  2:00 PM EDT by telephone and verified that I am speaking with the correct person using two identifiers.  Location: Patient: HOME Provider: [facility] Persons participating in the virtual visit: patient/Nurse Health Advisor   I discussed the limitations, risks, security and privacy concerns of performing an evaluation and management service by telephone and the availability of in person appointments. The patient expressed understanding and agreed to proceed.  Interactive audio and video telecommunications were attempted between this nurse and patient, however failed, due to patient having technical difficulties OR patient did not have access to video capability.  We continued and completed visit with audio only.  Some vital signs may be absent or patient reported.   Chris Driver, LPN  Review of Systems     Cardiac Risk Factors include: advanced age (>4mn, >>22women);diabetes mellitus;dyslipidemia;hypertension;obesity (BMI >30kg/m2);sedentary lifestyle;male gender     Objective:    Today's Vitals   02/07/21 1401  Weight: 231 lb (104.8 kg)  Height: '5\' 8"'$  (1.727 m)   Body mass index is 35.12 kg/m.  Advanced Directives 02/07/2021 08/13/2020 02/16/2014 08/18/2013 08/08/2013 04/11/2013 06/03/2012  Does Patient Have a Medical Advance Directive? No No Yes Patient does not have advance directive;Patient would like information Patient does not have advance directive Patient does not have advance directive;Patient would not like information Patient does not have advance directive;Patient would like information  Type of Advance Directive - - Living will;Healthcare Power of Attorney - - - -  Does patient want to make changes to medical advance directive? - - No - Patient declined - - - -  Copy of  [place] Chart? - - No - copy requested - - - -  Would patient like information on creating a medical advance directive? No - Patient declined No - Patient declined - Advance directive packet given - - Advance directive packet given  Pre-existing out of facility DNR order (yellow form or pink MOST form) - - - No - - No    Current Medications (verified) Outpatient Encounter Medications as of 02/07/2021  Medication Sig   acetaminophen (TYLENOL) 500 MG tablet Take 500 mg by mouth every 6 (six) hours as needed for mild pain.   aspirin EC 81 MG tablet Take 1 tablet (81 mg total) by mouth daily.   clopidogrel (PLAVIX) 75 MG tablet TAKE 8 TABLETS (600 MG TOTAL) BY MOUTH ONCE FOR 1 DOSE TONIGHT THEN TAKE 1 TABLET DAILY (Patient taking differently: Take 75 mg by mouth daily.)   EPINEPHrine 0.3 mg/0.3 mL IJ SOAJ injection Inject 0.3 mg into the muscle as needed for anaphylaxis.   metFORMIN (GLUCOPHAGE-XR) 500 MG 24 hr tablet Take 1,000 mg by mouth daily with breakfast.   nitroGLYCERIN (NITROSTAT) 0.4 MG SL tablet Place 0.4 mg under the tongue every 5 (five) minutes as needed for chest pain.   rosuvastatin (CRESTOR) 40 MG tablet TAKE 1 TABLET EVERY DAY   tamsulosin (FLOMAX) 0.4 MG CAPS capsule TAKE 1 CAPSULE(0.4 MG) BY MOUTH AT BEDTIME AS NEEDED   No facility-administered encounter medications on file as of 02/07/2021.    Allergies (verified) Brilinta [ticagrelor], Crestor [rosuvastatin calcium], and Lipitor [atorvastatin]   History: Past Medical History:  Diagnosis Date   Abnormal exercise myocardial perfusion study  03/2018- 38% ef, see report   Arthritis    Chronic kidney disease    Congestive heart failure (CHF) ([place]) 5/17   EF 35-40% [name]   Coronary artery disease    a. 1995 s/p CABG x 3 (VG->RCA, LIMA->LAD, VG->OM);  b. 07/2008 Inf MI/Cath/PCI: VG->RCA 99 - treated with Taxus DES (66m), LIMA and VG->OM patent, LAD 100, LCX 100, RCA 60d, EF 50%;  c.  09/2008 PCI native RCA  w/ 2.5x23 Xience DES, VG->RCA stent patent;  c. 05/2010 Cath: Native 3VD with 3/3 patent grafts, native RCA w 80% ISR prox to graft insertion-->Med Rx.   DOE (dyspnea on exertion)    ED (erectile dysfunction)    GERD (gastroesophageal reflux disease)    occ   Gout    H/O hiatal hernia    Hyperlipidemia    Hypertension    [place]ocardial infarction ([facility]    Neuropathy    toes   Obesity    RBBB (right bundle branch block)    Stenosis of right internal carotid artery    50-69% (2014)   Type II diabetes mellitus ([place]    Umbilical hernia    a. s/p repair.   Vertigo    Past Surgical History:  Procedure Laterality Date   ANKLE ARTHROSCOPY WITH DRILLING/MICROFRACTURE Left 04/13/2013   Procedure: LEFT ANKLE ARTHROSCOPY WITH EXTENSIVE DEBRIEDMENT;  Surgeon: JWylene Simmer MD;  [facility]  Service: Orthopedics;  Laterality: Left;   ANKLE SURGERY Left 08/18/2013   DR HEWITT   CARDIAC CATHETERIZATION  2000   stents x3   CARDIAC CATHETERIZATION  02/15/2014   Procedure: LEFT HEART CATH AND CORS/GRAFTS ANGIOGRAPHY;  Surgeon: JLaverda Page MD;  [facility];  Service: Cardiovascular;;   COLONOSCOPY     CORONARY ARTERY BYPASS GRAFT  1995   LIMA to LAD, SVG to RCA, SVG to OM.    CORONARY STENT INTERVENTION N/A 08/12/2020   Procedure: CORONARY STENT INTERVENTION;  Surgeon: PNigel Mormon MD;  [facility];  Service: Cardiovascular;  Laterality: N/A;   LEFT HEART CATH AND CORS/GRAFTS ANGIOGRAPHY N/A 08/12/2020   Procedure: LEFT HEART CATH AND CORS/GRAFTS ANGIOGRAPHY;  Surgeon: PNigel Mormon MD;  [facility];  Service: Cardiovascular;  Laterality: N/A;   TOTAL ANKLE ARTHROPLASTY Left 08/17/2013   Procedure: LEFT TOTAL ANKLE REPLACEMENT WITH POSSIBLE GASTROC RECESSION ;  Surgeon: JWylene Simmer MD;  [facility]  Service: Orthopedics;  Laterality: Left;   UMBILICAL HERNIA REPAIR  12/2007   Family  History  Problem Relation Age of Onset   Aneurysm Mother    Heart disease Father    Heart attack Father    Stroke Brother    Social History   Socioeconomic History   Marital status: Married    Spouse name: Not on file   Number of children: 4   Years of education: Not on file   Highest education level: Not on file  Occupational History   Not on file  Tobacco Use   Smoking status: Former    Packs/day: 1.00    Years: 9.00    Pack years: 9.00    Types: Cigarettes    Quit date: 10/31/1970    Years since quitting: 50.3   Smokeless tobacco: Former    Types: Chew   Tobacco comments:    chewed for 2 years in his 20's  Vaping Use   Vaping Use: Never used  Substance and Sexual Activity  Alcohol use: Not Currently    Comment: occ wine last 6 months   Drug use: No   Sexual activity: Yes  Other Topics Concern   Not on file  Social History Narrative   Lives in [place] with wife.  Retired.   Social Determinants of Health   Financial Resource Strain: Low Risk    Difficulty of Paying Living Expenses: Not hard at all  Food Insecurity: No Food Insecurity   Worried About Charity fundraiser in the Last Year: Never true   [place] in the Last Year: Never true  Transportation Needs: No Transportation Needs   Lack of Transportation (Medical): No   Lack of Transportation (Non-Medical): No  Physical Activity: Sufficiently Active   Days of Exercise per Week: 5 days   Minutes of Exercise per Session: 30 min  Stress: Stress Concern Present   Feeling of Stress : To some extent  Social Connections: Engineer, building services of Communication with Friends and Family: More than three times a week   Frequency of Social Gatherings with Friends and Family: More than three times a week   Attends Religious Services: More than 4 times per year   Active Member of [employer] or Organizations: Yes   Attends Music therapist: More than 4 times per year   Marital Status:  Married    Tobacco Counseling Counseling given: Not Answered Tobacco comments: chewed for 2 years in his 20's   Clinical Intake:  Pre-visit preparation completed: Yes  Pain : No/denies pain     BMI - recorded: 35.12 Nutritional Status: BMI > 30  Obese Nutritional Risks: None Diabetes: Yes  How often do you need to have someone help you when you read instructions, pamphlets, or other written materials from your doctor or pharmacy?: 1 - Never  Diabetic?Nutrition Risk Assessment:  Has the patient had any N/V/D within the last 2 months?  No  Does the patient have any non-healing wounds?  No  Has the patient had any unintentional weight loss or weight gain?  No   Diabetes:  Is the patient diabetic?  Yes  If diabetic, was a CBG obtained today?  No  Did the patient bring in their glucometer from home?  No Phone visit. How often do you monitor your CBG's? PRN.   Financial Strains and Diabetes Management:  Are you having any financial strains with the device, your supplies or your medication? No .  Does the patient want to be seen by Chronic Care Management for management of their diabetes?  No  Would the patient like to be referred to a Nutritionist or for Diabetic Management?  No   Diabetic Exams:  Diabetic Eye Exam: Completed 02/10/2019. Overdue for diabetic eye exam. Pt has been advised about the importance in completing this exam. A referral has been placed today. Message sent to referral coordinator for scheduling purposes. Advised pt to expect a call from office referred to regarding appt.  Diabetic Foot Exam: Completed 11/10/2018. Pt has been advised about the importance in completing this exam.    Interpreter Needed?: No  Information entered by :: Randal Buba, LPN   Activities of Daily Living In your present state of health, do you have any difficulty performing the following activities: 02/07/2021 08/12/2020  Hearing? N N  Vision? N N  Difficulty  concentrating or making decisions? Y N  Comment Pt has issues remebering at times. -  Walking or climbing stairs? N N  Dressing  or bathing? N N  Doing errands, shopping? N N  Preparing Food and eating ? N -  Using the Toilet? N -  In the past six months, have you accidently leaked urine? N -  Do you have problems with loss of bowel control? N -  Managing your Medications? N -  Managing your Finances? N -  Housekeeping or managing your Housekeeping? N -  Some recent data might be hidden    Patient Care Team: Susy Frizzle, MD as PCP - General (Family Medicine) Edythe Clarity, [facility] as Pharmacist (Pharmacist)  Indicate any recent Medical Services you may have received from other than [facility] providers in the past year (date may be approximate).     Assessment:   This is a routine wellness examination for Chris [name].  Hearing/Vision screen Hearing Screening - Comments:: No hearing isses.  Vision Screening - Comments:: Glasses. Up to date on eye exam. Did see Dr. Gershon Crane. Pt states he is going to see a different MD but will schedule.   Dietary issues and exercise activities discussed: Current Exercise Habits: Home exercise routine, Type of exercise: walking, Time (Minutes): 30, Frequency (Times/Week): 5, Weekly Exercise (Minutes/Week): 150, Intensity: Mild, Exercise limited by: cardiac condition(s)   Goals Addressed             This Visit's Progress    Have 3 meals a day         Depression Screen PHQ 2/9 Scores 02/07/2021  PHQ - 2 Score 0    Fall Risk Fall Risk  02/07/2021  Falls in the past year? 0  Number falls in past yr: 0  Injury with Fall? 0  Risk for fall due to : No Fall Risks  Follow up Falls prevention discussed    FALL RISK PREVENTION PERTAINING TO THE HOME:  Any stairs in or around the home? Yes  If so, are there any without handrails? No  Home free of loose throw rugs in walkways, pet beds, electrical cords, etc? Yes  Adequate lighting in your home  to reduce risk of falls? Yes   ASSISTIVE DEVICES UTILIZED TO PREVENT FALLS:  Life alert? No  Use of a Chris, walker or w/c? No  Grab bars in the bathroom? Yes  Shower chair or bench in shower? Yes  Elevated toilet seat or a handicapped toilet? Yes   TIMED UP AND GO:  Was the test performed? No . Phone visit.  Cognitive Function:     6CIT Screen 02/07/2021  What Year? 0 points  What month? 0 points  What time? 0 points  Count back from 20 0 points  Months in reverse 4 points  Repeat phrase 0 points  Total Score 4    Immunizations Immunization History  Administered Date(s) Administered   PFIZER(Purple Top)SARS-COV-2 Vaccination 07/09/2019, 08/08/2019   Pneumococcal Conjugate-13 11/13/2016   Pneumococcal Polysaccharide-23 08/18/2013   Tdap 11/13/2016    TDAP status: Up to date  Flu Vaccine status: Up to date  Pneumococcal vaccine status: Up to date  Covid-19 vaccine status: Information provided on how to obtain vaccines.   Qualifies for Shingles Vaccine? Yes   Zostavax completed No   Shingrix Completed?: No.    Education has been provided regarding the importance of this vaccine. Patient has been advised to call insurance company to determine out of pocket expense if they have not yet received this vaccine. Advised may also receive vaccine at local pharmacy or Health Dept. Verbalized acceptance and understanding.  Screening Tests Health  Maintenance  Topic Date Due   Hepatitis C Screening  Never done   Zoster Vaccines- Shingrix (1 of 2) Never done   FOOT EXAM  11/10/2019   URINE MICROALBUMIN  11/11/2019   COVID-19 Vaccine (3 - Booster for Pfizer series) 01/08/2020   OPHTHALMOLOGY EXAM  02/10/2020   INFLUENZA VACCINE  Never done   HEMOGLOBIN A1C  02/12/2021   TETANUS/TDAP  11/14/2026   HPV VACCINES  Aged Out    Health Maintenance  Health Maintenance Due  Topic Date Due   Hepatitis C Screening  Never done   Zoster Vaccines- Shingrix (1 of 2) Never done    FOOT EXAM  11/10/2019   URINE MICROALBUMIN  11/11/2019   COVID-19 Vaccine (3 - Booster for Pfizer series) 01/08/2020   OPHTHALMOLOGY EXAM  02/10/2020   INFLUENZA VACCINE  Never done    Colorectal cancer screening: Type of screening: Colonoscopy. Completed 12/31/2011. Repeat every 10 years  Lung Cancer Screening: (Low Dose CT Chest recommended if Age 73-80 years, 30 pack-year currently smoking OR have quit w/in 15years.) does not qualify.     Additional Screening:  Hepatitis C Screening: does not qualify  Vision Screening: Recommended annual ophthalmology exams for early detection of glaucoma and other disorders of the eye. Is the patient up to date with their annual eye exam?  No  Who is the provider or what is the name of the office in which the patient attends annual eye exams? Dr. Gershon Crane in 2020.  If pt is not established with a provider, would they like to be referred to a provider to establish care? No .   Dental Screening: Recommended annual dental exams for proper oral hygiene  Community Resource Referral / Chronic Care Management: CRR required this visit?  No   CCM required this visit?  No      Plan:     I have personally reviewed and noted the following in the patient's chart:   Medical and social history Use of alcohol, tobacco or illicit drugs  Current medications and supplements including opioid prescriptions. Patient is not currently taking opioid prescriptions. Functional ability and status Nutritional status Physical activity Advanced directives List of other physicians Hospitalizations, surgeries, and ER visits in previous 12 months Vitals Screenings to include cognitive, depression, and falls Referrals and appointments  In addition, I have reviewed and discussed with patient certain preventive protocols, quality metrics, and best practice recommendations. A written personalized care plan for preventive services as well as general preventive health  recommendations were provided to patient.     Chris Driver, LPN   [id number]   Nurse Notes: Pt overdue for eye exam. Offered referral to new eye Doctor. Pt declines at this time. Pt states he is going to call his wife's eye Doctor and make appointment with him. Pt also c/o increased stress due to caring for son, who has had a stroke. Offered [employer] but patient declines at this time. Pt would like paperwork to complete a living will. Papers mailed to patient.

## 2021-02-11 ENCOUNTER — Encounter: Payer: Self-pay | Admitting: Student

## 2021-02-11 ENCOUNTER — Other Ambulatory Visit: Payer: Self-pay

## 2021-02-11 ENCOUNTER — Ambulatory Visit: Payer: Medicare HMO | Admitting: Student

## 2021-02-11 VITALS — BP 126/78 | HR 76 | Temp 97.7°F | Ht 68.0 in | Wt 235.0 lb

## 2021-02-11 DIAGNOSIS — E1122 Type 2 diabetes mellitus with diabetic chronic kidney disease: Secondary | ICD-10-CM

## 2021-02-11 DIAGNOSIS — I951 Orthostatic hypotension: Secondary | ICD-10-CM | POA: Diagnosis not present

## 2021-02-11 DIAGNOSIS — I6523 Occlusion and stenosis of bilateral carotid arteries: Secondary | ICD-10-CM | POA: Diagnosis not present

## 2021-02-11 DIAGNOSIS — N1832 Chronic kidney disease, stage 3b: Secondary | ICD-10-CM

## 2021-02-11 MED ORDER — CLOPIDOGREL BISULFATE 75 MG PO TABS: 75.0000 mg | ORAL_TABLET | Freq: Every day | ORAL | 3 refills | Status: DC

## 2021-02-11 NOTE — Progress Notes (Signed)
Primary Physician/Referring:  Susy Frizzle, MD  Patient ID: Chris Reasons., male    DOB: [birth year], 80 y.o.   MRN: [id number]  Chief Complaint  Patient presents with   Fatigue   Shortness of Breath    HPI:    Chris Lenz.  is a 80 y.o. Caucasian male patient with hypertension, hyperlipidemia, diabetes mellitus diagnosed in 2018, CAD,hx of MI with CABG S/P multiple coronary interventions,  H/O stroke in past without residual deficits, chronic systolic and diastolic CHF with EF 96-78% and asymptomatic carotid artery stenosis, IPF.  Patient underwent PCI to high-grade stenosis of the SVG to RCA in March 2022.  Patient presents for 3-week follow-up of orthostatic hypotension.  At last office visit discontinued metoprolol. Patient's symptoms of fatigue and dizziness have improved since last visit with discontinuation of metoprolol. He does continue to have occasional episodes of dizziness upon standing, but this is less frequent.  Notably patient had also inadvertently also stopped his Plavix. Denies chest pain, palpitations, syncope.  He continues to have dyspnea on exertion which is stable.  Past Medical History:  Diagnosis Date   Abnormal exercise myocardial perfusion study    03/2018- 38% ef, see report   Arthritis    Chronic kidney disease    Congestive heart failure (CHF) ([place]) 5/17   EF 35-40% [name]   Coronary artery disease    a. 1995 s/p CABG x 3 (VG->RCA, LIMA->LAD, VG->OM);  b. 07/2008 Inf MI/Cath/PCI: VG->RCA 99 - treated with Taxus DES (43mm), LIMA and VG->OM patent, LAD 100, LCX 100, RCA 60d, EF 50%;  c. 09/2008 PCI native RCA  w/ 2.5x23 Xience DES, VG->RCA stent patent;  c. 05/2010 Cath: Native 3VD with 3/3 patent grafts, native RCA w 80% ISR prox to graft insertion-->Med Rx.   DOE (dyspnea on exertion)    ED (erectile dysfunction)    GERD (gastroesophageal reflux disease)    occ   Gout    H/O hiatal hernia    Hyperlipidemia    Hypertension     Myocardial infarction ([place])    Neuropathy    toes   Obesity    RBBB (right bundle branch block)    Stenosis of right internal carotid artery    50-69% (2014)   Type II diabetes mellitus ([place])    Umbilical hernia    a. s/p repair.   Vertigo    Past Surgical History:  Procedure Laterality Date   ANKLE ARTHROSCOPY WITH DRILLING/MICROFRACTURE Left 04/13/2013   Procedure: LEFT ANKLE ARTHROSCOPY WITH EXTENSIVE DEBRIEDMENT;  Surgeon: Wylene Simmer, MD;  Location: [facility];  Service: Orthopedics;  Laterality: Left;   ANKLE SURGERY Left 08/18/2013   DR HEWITT   CARDIAC CATHETERIZATION  2000   stents x3   CARDIAC CATHETERIZATION  02/15/2014   Procedure: LEFT HEART CATH AND CORS/GRAFTS ANGIOGRAPHY;  Surgeon: Laverda Page, MD;  Location: [facility] CATH LAB;  Service: Cardiovascular;;   COLONOSCOPY     CORONARY ARTERY BYPASS GRAFT  1995   LIMA to LAD, SVG to RCA, SVG to OM.    CORONARY STENT INTERVENTION N/A 08/12/2020   Procedure: CORONARY STENT INTERVENTION;  Surgeon: Nigel Mormon, MD;  Location: [facility] CV LAB;  Service: Cardiovascular;  Laterality: N/A;   LEFT HEART CATH AND CORS/GRAFTS ANGIOGRAPHY N/A 08/12/2020   Procedure: LEFT HEART CATH AND CORS/GRAFTS ANGIOGRAPHY;  Surgeon: Nigel Mormon, MD;  Location: [facility] CV LAB;  Service: Cardiovascular;  Laterality: N/A;  TOTAL ANKLE ARTHROPLASTY Left 08/17/2013   Procedure: LEFT TOTAL ANKLE REPLACEMENT WITH POSSIBLE GASTROC RECESSION ;  Surgeon: Wylene Simmer, MD;  Location: [facility];  Service: Orthopedics;  Laterality: Left;   UMBILICAL HERNIA REPAIR  12/2007   Family History  Problem Relation Age of Onset   Aneurysm Mother    Heart disease Father    Heart attack Father    Stroke Brother     Social History   Tobacco Use   Smoking status: Former    Packs/day: 1.00    Years: 9.00    Pack years: 9.00    Types: Cigarettes    Quit date: 10/31/1970    Years since quitting: 50.3   Smokeless tobacco: Former     Types: Chew   Tobacco comments:    chewed for 2 years in his 20's  Substance Use Topics   Alcohol use: Not Currently    Comment: occ wine last 6 months   ROS  Review of Systems  Constitutional: Negative for malaise/fatigue and weight gain.  Cardiovascular:  Positive for dyspnea on exertion (stable). Negative for chest pain, claudication, leg swelling, near-syncope, orthopnea, palpitations, paroxysmal nocturnal dyspnea and syncope.  Respiratory:  Negative for shortness of breath.   Musculoskeletal:  Positive for arthritis.  Gastrointestinal:  Negative for melena.  Neurological:  Positive for dizziness (improved).  Objective  Blood pressure 126/78, pulse 76, temperature 97.7 F (36.5 C), height 5\' 8"  (1.727 m), weight 235 lb (106.6 kg), SpO2 96 %.  Vitals with BMI 02/11/2021 02/07/2021 01/21/2021  Height 5\' 8"  5\' 8"  5\' 8"   Weight 235 lbs 231 lbs 230 lbs 10 oz  BMI 35.74 41.74 08.14  Systolic 481 - 856  Diastolic 78 - 78  Pulse 76 - 96   Orthostatic VS for the past 72 hrs (Last 3 readings):  Orthostatic BP Patient Position BP Location Cuff Size Orthostatic Pulse  02/11/21 1400 -- Sitting Left Arm Normal --  02/11/21 1349 108/67 Standing Left Arm Normal 83  02/11/21 1348 111/71 Sitting Left Arm Normal 83  02/11/21 1347 135/79 Supine Left Arm Normal 73    Physical Exam Vitals reviewed.  Constitutional:      General: He is not in acute distress.    Appearance: He is well-developed. He is obese.  Neck:     Vascular: No JVD.  Cardiovascular:     Rate and Rhythm: Normal rate and regular rhythm.     Pulses:          Carotid pulses are 2+ on the right side and 2+ on the left side.    Heart sounds: Normal heart sounds. No murmur heard.   No gallop.     Comments: No JVD, no leg edema.  Fem and Pop pulse difficult to feel due to body habitus.  DP  Normal and PT absent bilateral Pulmonary:     Effort: Pulmonary effort is normal.     Breath sounds: Rales (left worse than right coarse  crackles) present.  Musculoskeletal:     Right lower leg: No edema.     Left lower leg: No edema.  Neurological:     Mental Status: He is alert.  Physical exam unchanged compared to previous.  Laboratory examination:   Recent Labs    05/15/20 1000 08/12/20 1253 08/30/20 1456 09/04/20 1603 09/05/20 0155 09/23/20 0425 12/12/20 2240  NA 141   < > 139   < > 137 140 135  K 4.7   < > 4.6   < >  3.9 4.0 3.8  CL 104   < > 104   < > 106 107 109  CO2 21   < > 25   < > 24 24 18*  GLUCOSE 134*   < > 92   < > 95 154* 151*  BUN 13   < > 16   < > 16 12 18   CREATININE 1.24   < > 1.23*   < > 1.39* 1.52* 1.80*  CALCIUM 9.6   < > 9.8   < > 8.9 9.0 8.8*  GFRNONAA 56*   < > 56*   < > 52* 47* 38*  GFRAA 64  --  65  --   --   --   --    < > = values in this interval not displayed.   CrCl cannot be calculated (Patient's most recent lab result is older than the maximum 21 days allowed.).  CMP Latest Ref Rng & Units 12/12/2020 09/23/2020 09/05/2020  Glucose 70 - 99 mg/dL 151(H) 154(H) 95  BUN 8 - 23 mg/dL 18 12 16   Creatinine 0.61 - 1.24 mg/dL 1.80(H) 1.52(H) 1.39(H)  Sodium 135 - 145 mmol/L 135 140 137  Potassium 3.5 - 5.1 mmol/L 3.8 4.0 3.9  Chloride 98 - 111 mmol/L 109 107 106  CO2 22 - 32 mmol/L 18(L) 24 24  Calcium 8.9 - 10.3 mg/dL 8.8(L) 9.0 8.9  Total Protein 6.5 - 8.1 g/dL - 6.9 -  Total Bilirubin 0.3 - 1.2 mg/dL - 0.6 -  Alkaline Phos 38 - 126 U/L - 30(L) -  AST 15 - 41 U/L - 24 -  ALT 0 - 44 U/L - 16 -   CBC Latest Ref Rng & Units 12/12/2020 09/23/2020 09/04/2020  WBC 4.0 - 10.5 K/uL 12.7(H) 10.2 8.4  Hemoglobin 13.0 - 17.0 g/dL 14.1 12.8(L) 13.2  Hematocrit 39.0 - 52.0 % 44.3 39.1 40.3  Platelets 150 - 400 K/uL 276 228 230   Lipid Panel Recent Labs    05/15/20 1000 08/12/20 1253 08/13/20 0306  CHOL 183 126 110  TRIG 344* 220* 230*  LDLCALC 87 40 29  VLDL  --  44* 46*  HDL 40 42 35*  CHOLHDL  --  3.0 3.1    HEMOGLOBIN A1C Lab Results  Component Value Date   HGBA1C 6.4 (H)  08/12/2020   MPG 136.98 08/12/2020   TSH No results for input(s): TSH in the last 8760 hours.  Allergies   Allergies  Allergen Reactions   Brilinta [Ticagrelor] Shortness Of Breath   Crestor [Rosuvastatin Calcium] Other (See Comments)    Muscle pain- tolerating this 2022, however   Lipitor [Atorvastatin] Other (See Comments)    Intolerable muscle pain all over     Medications Prior to Visit:   Outpatient Medications Prior to Visit  Medication Sig Dispense Refill   acetaminophen (TYLENOL) 500 MG tablet Take 500 mg by mouth every 6 (six) hours as needed for mild pain.     aspirin EC 81 MG tablet Take 1 tablet (81 mg total) by mouth daily.     EPINEPHrine 0.3 mg/0.3 mL IJ SOAJ injection Inject 0.3 mg into the muscle as needed for anaphylaxis. 1 each 0   metFORMIN (GLUCOPHAGE-XR) 500 MG 24 hr tablet Take 1,000 mg by mouth daily with breakfast.     nitroGLYCERIN (NITROSTAT) 0.4 MG SL tablet Place 0.4 mg under the tongue every 5 (five) minutes as needed for chest pain.     rosuvastatin (CRESTOR) 40 MG tablet  TAKE 1 TABLET EVERY DAY 90 tablet 0   tamsulosin (FLOMAX) 0.4 MG CAPS capsule TAKE 1 CAPSULE(0.4 MG) BY MOUTH AT BEDTIME AS NEEDED 30 capsule 3   clopidogrel (PLAVIX) 75 MG tablet TAKE 8 TABLETS (600 MG TOTAL) BY MOUTH ONCE FOR 1 DOSE TONIGHT THEN TAKE 1 TABLET DAILY (Patient taking differently: Take 75 mg by mouth daily.) 38 tablet 0   No facility-administered medications prior to visit.   Final Medications at End of Visit    Current Meds  Medication Sig   acetaminophen (TYLENOL) 500 MG tablet Take 500 mg by mouth every 6 (six) hours as needed for mild pain.   aspirin EC 81 MG tablet Take 1 tablet (81 mg total) by mouth daily.   EPINEPHrine 0.3 mg/0.3 mL IJ SOAJ injection Inject 0.3 mg into the muscle as needed for anaphylaxis.   metFORMIN (GLUCOPHAGE-XR) 500 MG 24 hr tablet Take 1,000 mg by mouth daily with breakfast.   nitroGLYCERIN (NITROSTAT) 0.4 MG SL tablet Place 0.4 mg  under the tongue every 5 (five) minutes as needed for chest pain.   rosuvastatin (CRESTOR) 40 MG tablet TAKE 1 TABLET EVERY DAY   [DISCONTINUED] tamsulosin (FLOMAX) 0.4 MG CAPS capsule TAKE 1 CAPSULE(0.4 MG) BY MOUTH AT BEDTIME AS NEEDED    Radiology:   CT angio chest 07/25/2019: 1. No evidence of pulmonary embolism. 2. Scattered ground-glass density throughout both lungs with increased central and subpleural reticulation and areas of mosaic attenuation. No frank honeycombing or significant bronchiectasis. Differential considerations include chronic hypersensitivity pneumonitis or idiopathic interstitial pneumonia such as NSIP. Pulmonary consultation is suggested, as well as high-resolution chest CT follow-up in 6 months. 3. 5 mm pulmonary nodule along the right major fissure. No follow-up needed if patient is low-risk. Non-contrast chest CT can be considered in 12 months if patient is high-risk.  4.  Aortic atherosclerosis (ICD10-I70.0).  CT Chest 12/04/2020:  1. 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 Findings are consistent with UIP per consensus guidelines: Diagnosis of Idiopathic Pulmonary Fibrosis: An Official ATS/ERS/JRS/ALAT Clinical Practice Guideline. Chris [name], Iss 5, [phone], Jan 23 2017. 2. Stable borderline mild cardiomegaly. 3. Large hiatal hernia. 4. Aortic Atherosclerosis (ICD10-I70.0).  Cardiac Studies:   Lexiscan myoview stress test 04/11/2018: 1. Lexiscan stress test was performed. Exercise capacity was not assessed. No stress symptoms reported. Blood pressure was normal.  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  Stress EKG is non diagnostic for ischemia as it is a pharmacologic stress. 2. The overall quality of the study is  good.  Left ventricular cavity is noted to be normal on the rest and stress studies.  Gated SPECT imaging demonstrates hypokinesis of the basal inferior, basal inferolateral, mid inferior, mid inferolateral, apical inferior and apical lateral myocardial wall(s).  The left ventricular ejection fraction was calculated or visually estimated to be 38%. LSPECT images reveal a large sized, medium intensity, minimally reversible perfusion defect suggestive of large infarct with minimal peri infarct ischemia in LCx/PDA territory.   3. High risk study.  Overnight oximetry test 10/11/2019  Desaturation below 88% 17.6 minutes.  Left heart catheterization 08/12/2020: LM: Normal LAD: 100% occlusion after D1 LCx: 100% prox occlusion RCA: 100% mid occlusion with ISR LIMA-LAD: Patent SVG-OM3: Patent SVG-RCA: Patent prox-mid stent with 40% lumen loss ( 09/2008 PCI with 38 mm Taxus DES)  90% and 80% lesions in distal part of SVG-RCA                90% lesion at SVG-to RCA anastomosis   LVEF 40-45%. LVEDP 18 mmHg   Successful stenting of SVG to RCA, distal segment with 3.5 x 38 mm and anastamosis with 2.5 x 12 mm resolute Onyx DES.  Echocardiogram 11/06/2020:   Left ventricle cavity is normal in size. Moderate concentric hypertrophy of the left ventricle.  Left ventricle regional wall motion findings: Basal inferolateral, Basal inferior, Mid inferolateral and Mid inferior akinesis. Moderate decrease in global wall motion. Visual EF is 35-40%. Doppler evidence of grade II (pseudonormal) diastolic dysfunction, elevated LAP. Left atrial cavity is moderately dilated by volume. Right ventricle cavity is mildly dilated. Normal right ventricular function. Compared to 08/13/2020, no significant change.  Carotid artery duplex 11/06/2020: Doppler velocity suggests stenosis in the right internal carotid artery (1-15%). Doppler velocity suggests stenosis in the left internal carotid artery  (16-49%). Antegrade right vertebral artery flow. Antegrade left vertebral artery flow. Compared to 04/24/2020, right ICA stenosis was 50-69% stenosis. Follow up in one year is appropriate if clinically indicated.   EKG   11/13/2020: Normal sinus rhythm at rate of 64 beats minute, left atrial enlargement, right axis deviation, cannot exclude inferior infarct old.  Right bundle branch block.  No significant change from 08/12/2020. Cnsider pulmonary disease pattern.    Assessment   1. Orthostatic hypotension   2. Type 2 diabetes mellitus with stage 3b chronic kidney disease, without long-term current use of insulin ([place])   3. Asymptomatic bilateral carotid artery stenosis     Meds ordered this encounter  Medications   clopidogrel (PLAVIX) 75 MG tablet    Sig: Take 1 tablet (75 mg total) by mouth daily.    Dispense:  90 tablet    Refill:  3    Medications Discontinued During This Encounter  Medication Reason   tamsulosin (FLOMAX) 0.4 MG CAPS capsule Error   clopidogrel (PLAVIX) 75 MG tablet Discontinued by provider     Orders Placed This Encounter  Procedures   Hgb A1c w/o eAG      Recommendations:   Divante Kotch.  is a 80 y.o. Caucasian male patient with hypertension, hyperlipidemia, diabetes mellitus diagnosed in 2018, CAD, hx of MI with CABG S/P multiple coronary interventions,  H/O stroke in past without residual deficits, chronic systolic and diastolic CHF with EF 67-67% and asymptomatic carotid artery stenosis.  He had presented with unstable anginal-like symptoms to our office in March 2022, underwent successful angioplasty to saphenous vein graft to the right coronary artery.  Patient presents for 3-week follow-up of orthostatic hypotension.  At last office visit discontinued metoprolol.  Patient remains orthostatic in the office today, however symptoms have improved and are now less frequent with discontinuation of metoprolol.  Patient would benefit from initiation of  valsartan or Entresto as well as Imdur and a rechallenge of beta-blocker therapy, however given that he remains orthostatic will hold off at this time.  Advised patient regarding conservative measures including hydration and changing positions slowly.  Patient verbalized understanding agreement.  Patient had inadvertently discontinued Plavix, will resume at this time given successful stenting of SVG to RCA on 08/12/2020, recommend continuing dual antiplatelet therapy until 07/2021 if tolerated.  Patient is requesting A1c check for his PCPs office, we will therefore order this.  Reviewed and discussed with patient carotid artery duplex results, details above.  We will repeat carotid artery  surveillance in 1 year.  Follow-up in 3 months, sooner if needed.   Chris Berthold, PA-C 02/11/2021, 2:48 PM Office: [phone]

## 2021-02-14 ENCOUNTER — Other Ambulatory Visit: Payer: Self-pay

## 2021-02-14 ENCOUNTER — Telehealth: Payer: Self-pay | Admitting: Student

## 2021-02-14 MED ORDER — CLOPIDOGREL BISULFATE 75 MG PO TABS: 75.0000 mg | ORAL_TABLET | Freq: Every day | ORAL | 1 refills | Status: DC

## 2021-02-14 NOTE — Telephone Encounter (Signed)
Patient requesting refill for plavix. Phone # [phone].

## 2021-02-14 NOTE — Telephone Encounter (Signed)
Done

## 2021-02-26 ENCOUNTER — Telehealth: Payer: Self-pay | Admitting: Pharmacist

## 2021-02-26 NOTE — Progress Notes (Signed)
    Chronic Care Management Pharmacy Assistant   Name: Chris [name].  MRN: [id number] DOB: [birth year]  Reason for Encounter: Disease State For DM.   Conditions to be addressed/monitored: CAD, HTN,CVA,CHF,TYPE 2 DM,HLD.  Recent office visits:  02/07/21 Chris Driver, LPN For medicare wellness. No medication changes.  Recent consult visits:  02/11/21 Cardiology [name], [name], PA-C. For follow-up. CHANGED Clopidogrel Bisulfate to 75 mg daily.  01/21/21 Cardiology [name], [name], PA-C. For chest apin. STOPPED Metoprolol.  Hospital visits:  None since 01/09/21.  Medications: Outpatient Encounter Medications as of 02/26/2021  Medication Sig   acetaminophen (TYLENOL) 500 MG tablet Take 500 mg by mouth every 6 (six) hours as needed for mild pain.   aspirin EC 81 MG tablet Take 1 tablet (81 mg total) by mouth daily.   clopidogrel (PLAVIX) 75 MG tablet Take 1 tablet (75 mg total) by mouth daily.   EPINEPHrine 0.3 mg/0.3 mL IJ SOAJ injection Inject 0.3 mg into the muscle as needed for anaphylaxis.   metFORMIN (GLUCOPHAGE-XR) 500 MG 24 hr tablet Take 1,000 mg by mouth daily with breakfast.   nitroGLYCERIN (NITROSTAT) 0.4 MG SL tablet Place 0.4 mg under the tongue every 5 (five) minutes as needed for chest pain.   rosuvastatin (CRESTOR) 40 MG tablet TAKE 1 TABLET EVERY DAY   No facility-administered encounter medications on file as of 02/26/2021.   Recent Relevant Labs: Lab Results  Component Value Date/Time   HGBA1C 6.4 (H) 08/12/2020 01:11 PM   HGBA1C 7.0 (H) 05/15/2020 10:00 AM   MICROALBUR 0.7 11/11/2018 09:21 AM   MICROALBUR 1.3 11/13/2016 12:36 PM    Kidney Function Lab Results  Component Value Date/Time   CREATININE 1.80 (H) 12/12/2020 10:40 PM   CREATININE 1.52 (H) 09/23/2020 04:25 AM   CREATININE 1.23 (H) 08/30/2020 02:56 PM   CREATININE 1.44 (H) 08/01/2019 08:35 AM   GFRNONAA 38 (L) 12/12/2020 10:40 PM   GFRNONAA 56 (L) 08/30/2020 02:56 PM   GFRAA 65  08/30/2020 02:56 PM    Current antihyperglycemic regimen:  Metformin 500 mg 1,000 mg by mouth daily with breakfast.  What recent interventions/DTPs have been made to improve glycemic control:  None.   Have there been any recent hospitalizations or ED visits since last visit with CPP? Patient stated no.   Patient denies hypoglycemic symptoms, including None  Patient denies hyperglycemic symptoms, including none  How often are you checking your blood sugar? Patient does not check his blood sugar because he doesn't know how to use his meter.  What are your blood sugars ranging?  Patient does not check his blood sugar because he doesn't know how to use his meter.  During the week, how often does your blood glucose drop below 70?  Patient does not check his blood sugar because he doesn't know how to use his meter.  Are you checking your feet daily/regularly?  Patient reminded to check his feet regularly.   Adherence Review: Is the patient currently on a STATIN medication? Rosuvastatin 40 mg  Is the patient currently on ACE/ARB medication? N/A.  Does the patient have >5 day gap between last estimated fill dates? Per misc prts, yes.   Care Gaps:Patient is due for his eye and foot exam. Patient needs updated labs drawn.   Star Rating Drugs:Rosuvastatin 40 mg 12/09/20 90 DS, Metformin 500 mg 11/07/20 90 DS.   Follow-Up:Pharmacist Review  Charlann Lange, [place] Pharmacist Assistant [phone]

## 2021-03-10 ENCOUNTER — Ambulatory Visit: Payer: Medicare HMO | Admitting: Allergy & Immunology

## 2021-03-11 ENCOUNTER — Encounter: Payer: Self-pay | Admitting: Allergy & Immunology

## 2021-03-11 ENCOUNTER — Other Ambulatory Visit: Payer: Self-pay

## 2021-03-11 ENCOUNTER — Ambulatory Visit: Payer: Medicare HMO | Admitting: Allergy & Immunology

## 2021-03-11 VITALS — BP 130/76 | HR 84 | Temp 97.3°F | Resp 20 | Ht 68.0 in | Wt 232.2 lb

## 2021-03-11 DIAGNOSIS — T7800XD Anaphylactic reaction due to unspecified food, subsequent encounter: Secondary | ICD-10-CM

## 2021-03-11 DIAGNOSIS — L2089 Other atopic dermatitis: Secondary | ICD-10-CM
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
## 2021-03-11 NOTE — Patient Instructions (Addendum)
1. Anaphylactic shock due to food - Information on alpha gal syndrome provided. - EpiPen is up to date.  - Continue to avoid red meat. - Keep milk in your diet.  - We will check the levels every year to see where this is trending.   2. Flexural atopic dermatitis - Continue with triamcinolone 0.1% ointment twice daily as needed. - Take pictures of the rash.  - You seem to have a good handle on your symptoms. - Samples of Free and Clear detergent provided.   3. Return in about 6 months (around 09/09/2021).    Please inform us of any Emergency Department visits, hospitalizations, or changes in symptoms. Call us before going to the ED for breathing or allergy symptoms since we might be able to fit you in for a sick visit. Feel free to contact us anytime with any questions, problems, or concerns.  It was a pleasure to meet you today!  Websites that have reliable patient information: 1. American Academy of Asthma, Allergy, and Immunology: www.aaaai.org 2. Food Allergy Research and Education (FARE): foodallergy.org 3. Mothers of Asthmatics: http://www.asthmacommunitynetwork.org 4. American College of Allergy, Asthma, and Immunology: www.acaai.org   COVID-19 Vaccine Information can be found at: ShippingScam.co.uk For questions related to vaccine distribution or appointments, please email [email or web address] or call [phone].   We realize that you might be concerned about having an allergic reaction to the COVID19 vaccines. To help with that concern, WE ARE OFFERING THE COVID19 VACCINES IN OUR OFFICE! Ask the front desk for dates!     "Like" [place] on Facebook and Instagram for our latest updates!      A healthy democracy works best when [employer] participate! Make sure you are registered to vote! If you have moved or changed any of your contact information, you will need to get this updated before voting!  In some cases, you  MAY be able to register to vote online: [email or web address]

## 2021-04-01 ENCOUNTER — Telehealth: Payer: Self-pay | Admitting: *Deleted

## 2021-04-01 NOTE — Telephone Encounter (Signed)
Received message from staff: about to get a covid booster and a flu shot. He wants to speak with [name] or you to see if it would be advisable since he has allergies. said he needs a copy of the shots he had last year??? I think that's what he needs to provide the pharmacy with to receive immunizations. He's going to either call back or come up here.  Call placed to patient. [place].

## 2021-04-02 NOTE — Telephone Encounter (Signed)
Call placed to patient. LMTRC.  

## 2021-04-03 NOTE — Telephone Encounter (Signed)
Please advise patient that Shingrix vaccines are not available at our office.   We can provide the flu shot high dose today. If flu shot is given today, please wait 2 weeks before other vaccines.

## 2021-04-04 ENCOUNTER — Other Ambulatory Visit: Payer: Self-pay | Admitting: Family Medicine

## 2021-04-04 NOTE — Telephone Encounter (Signed)
Per chart Metformin was d/c'd:  metFORMIN (GLUCOPHAGE XR) 500 MG 24 hr tablet [[id number]]  DISCONTINUED Discontinued by: Nigel Mormon, MD on 09/04/2020 17:01  Reason: Stop Taking at Discharge    Please advise. Thanks!

## 2021-05-06 ENCOUNTER — Emergency Department (HOSPITAL_COMMUNITY): Payer: Medicare HMO

## 2021-05-06 ENCOUNTER — Observation Stay (HOSPITAL_COMMUNITY)
Admission: EM | Admit: 2021-05-06 | Discharge: 2021-05-07 | Disposition: A | Discharge status: Home / Self Care | Payer: Medicare HMO | Attending: Internal Medicine | Admitting: Internal Medicine

## 2021-05-06 ENCOUNTER — Encounter (HOSPITAL_COMMUNITY): Payer: Self-pay | Admitting: Emergency Medicine

## 2021-05-06 ENCOUNTER — Other Ambulatory Visit: Payer: Self-pay

## 2021-05-06 DIAGNOSIS — Z20822 Contact with and (suspected) exposure to covid-19: Secondary | ICD-10-CM | POA: Diagnosis not present

## 2021-05-06 DIAGNOSIS — I110 Hypertensive heart disease with heart failure: Secondary | ICD-10-CM | POA: Diagnosis not present

## 2021-05-06 DIAGNOSIS — K449 Diaphragmatic hernia without obstruction or gangrene: Secondary | ICD-10-CM | POA: Diagnosis not present

## 2021-05-06 DIAGNOSIS — I25110 Atherosclerotic heart disease of native coronary artery with unstable angina pectoris: Secondary | ICD-10-CM

## 2021-05-06 DIAGNOSIS — I130 Hypertensive heart and chronic kidney disease with heart failure and stage 1 through stage 4 chronic kidney disease, or unspecified chronic kidney disease: Secondary | ICD-10-CM | POA: Diagnosis not present

## 2021-05-06 DIAGNOSIS — I10 Essential (primary) hypertension: Secondary | ICD-10-CM | POA: Diagnosis not present

## 2021-05-06 DIAGNOSIS — N189 Chronic kidney disease, unspecified: Secondary | ICD-10-CM | POA: Diagnosis not present

## 2021-05-06 DIAGNOSIS — Z955 Presence of coronary angioplasty implant and graft: Secondary | ICD-10-CM | POA: Insufficient documentation

## 2021-05-06 DIAGNOSIS — J84112 Idiopathic pulmonary fibrosis: Principal | ICD-10-CM | POA: Diagnosis present

## 2021-05-06 DIAGNOSIS — E119 Type 2 diabetes mellitus without complications: Secondary | ICD-10-CM

## 2021-05-06 DIAGNOSIS — J984 Other disorders of lung: Secondary | ICD-10-CM | POA: Diagnosis not present

## 2021-05-06 DIAGNOSIS — T781XXA Other adverse food reactions, not elsewhere classified, initial encounter: Secondary | ICD-10-CM | POA: Diagnosis not present

## 2021-05-06 DIAGNOSIS — G4733 Obstructive sleep apnea (adult) (pediatric): Secondary | ICD-10-CM | POA: Diagnosis present

## 2021-05-06 DIAGNOSIS — I700 Atherosclerosis of aorta: Secondary | ICD-10-CM | POA: Diagnosis not present

## 2021-05-06 DIAGNOSIS — Z8673 Personal history of transient ischemic attack (TIA), and cerebral infarction without residual deficits: Secondary | ICD-10-CM | POA: Insufficient documentation

## 2021-05-06 DIAGNOSIS — Z96652 Presence of left artificial knee joint: Secondary | ICD-10-CM | POA: Diagnosis not present

## 2021-05-06 DIAGNOSIS — N281 Cyst of kidney, acquired: Secondary | ICD-10-CM | POA: Diagnosis not present

## 2021-05-06 DIAGNOSIS — N2889 Other specified disorders of kidney and ureter: Secondary | ICD-10-CM | POA: Diagnosis not present

## 2021-05-06 DIAGNOSIS — E1122 Type 2 diabetes mellitus with diabetic chronic kidney disease: Secondary | ICD-10-CM | POA: Insufficient documentation

## 2021-05-06 DIAGNOSIS — E785 Hyperlipidemia, unspecified: Secondary | ICD-10-CM | POA: Diagnosis present

## 2021-05-06 DIAGNOSIS — Z7982 Long term (current) use of aspirin: Secondary | ICD-10-CM | POA: Insufficient documentation

## 2021-05-06 DIAGNOSIS — I509 Heart failure, unspecified: Secondary | ICD-10-CM | POA: Diagnosis not present

## 2021-05-06 DIAGNOSIS — I2510 Atherosclerotic heart disease of native coronary artery without angina pectoris: Secondary | ICD-10-CM | POA: Diagnosis not present

## 2021-05-06 DIAGNOSIS — I5042 Chronic combined systolic (congestive) and diastolic (congestive) heart failure: Secondary | ICD-10-CM | POA: Insufficient documentation

## 2021-05-06 DIAGNOSIS — Z87891 Personal history of nicotine dependence: Secondary | ICD-10-CM | POA: Diagnosis not present

## 2021-05-06 DIAGNOSIS — I5022 Chronic systolic (congestive) heart failure: Secondary | ICD-10-CM | POA: Diagnosis present

## 2021-05-06 DIAGNOSIS — E8770 Fluid overload, unspecified: Secondary | ICD-10-CM | POA: Diagnosis not present

## 2021-05-06 DIAGNOSIS — Z951 Presence of aortocoronary bypass graft: Secondary | ICD-10-CM | POA: Diagnosis not present

## 2021-05-06 DIAGNOSIS — Z79899 Other long term (current) drug therapy: Secondary | ICD-10-CM | POA: Diagnosis not present

## 2021-05-06 DIAGNOSIS — Z7984 Long term (current) use of oral hypoglycemic drugs: Secondary | ICD-10-CM | POA: Insufficient documentation

## 2021-05-06 DIAGNOSIS — R079 Chest pain, unspecified: Secondary | ICD-10-CM | POA: Diagnosis not present

## 2021-05-06 DIAGNOSIS — Z7902 Long term (current) use of antithrombotics/antiplatelets: Secondary | ICD-10-CM | POA: Insufficient documentation

## 2021-05-06 DIAGNOSIS — K5730 Diverticulosis of large intestine without perforation or abscess without bleeding: Secondary | ICD-10-CM | POA: Diagnosis not present

## 2021-05-06 DIAGNOSIS — E669 Obesity, unspecified: Secondary | ICD-10-CM | POA: Diagnosis present

## 2021-05-06 HISTORY — DX: Unspecified right bundle-branch block: I45.10

## 2021-05-06 LAB — COMPREHENSIVE METABOLIC PANEL
ALT: 21 U/L (ref 0–44)
AST: 32 U/L (ref 15–41)
Albumin: 4 g/dL (ref 3.5–5.0)
Alkaline Phosphatase: 44 U/L (ref 38–126)
Anion gap: 12 (ref 5–15)
BUN: 16 mg/dL (ref 8–23)
CO2: 21 mmol/L — ABNORMAL LOW (ref 22–32)
Calcium: 9.8 mg/dL (ref 8.9–10.3)
Chloride: 104 mmol/L (ref 98–111)
Creatinine, Ser: 1.33 mg/dL — ABNORMAL HIGH (ref 0.61–1.24)
GFR, Estimated: 55 mL/min — ABNORMAL LOW (ref >60)
Glucose, Bld: 133 mg/dL — ABNORMAL HIGH (ref 70–99)
Potassium: 5 mmol/L (ref 3.5–5.1)
Sodium: 137 mmol/L (ref 135–145)
Total Bilirubin: 0.7 mg/dL (ref 0.3–1.2)
Total Protein: 8.3 g/dL — ABNORMAL HIGH (ref 6.5–8.1)

## 2021-05-06 LAB — I-STAT CHEM 8, ED
BUN: 18 mg/dL (ref 8–23)
Calcium, Ion: 1.09 mmol/L — ABNORMAL LOW (ref 1.15–1.40)
Chloride: 105 mmol/L (ref 98–111)
Creatinine, Ser: 1.3 mg/dL — ABNORMAL HIGH (ref 0.61–1.24)
Glucose, Bld: 133 mg/dL — ABNORMAL HIGH (ref 70–99)
HCT: 44 % (ref 39.0–52.0)
Hemoglobin: 15 g/dL (ref 13.0–17.0)
Potassium: 4.9 mmol/L (ref 3.5–5.1)
Sodium: 140 mmol/L (ref 135–145)
TCO2: 24 mmol/L (ref 22–32)

## 2021-05-06 LAB — CBC WITH DIFFERENTIAL/PLATELET
Abs Immature Granulocytes: 0.03 10*3/uL (ref 0.00–0.07)
Basophils Absolute: 0.1 10*3/uL (ref 0.0–0.1)
Basophils Relative: 1 %
Eosinophils Absolute: 0.3 10*3/uL (ref 0.0–0.5)
Eosinophils Relative: 3 %
HCT: 43.3 % (ref 39.0–52.0)
Hemoglobin: 13.5 g/dL (ref 13.0–17.0)
Immature Granulocytes: 0 %
Lymphocytes Relative: 42 %
Lymphs Abs: 4 10*3/uL (ref 0.7–4.0)
MCH: 26.8 pg (ref 26.0–34.0)
MCHC: 31.2 g/dL (ref 30.0–36.0)
MCV: 86.1 fL (ref 80.0–100.0)
Monocytes Absolute: 0.8 10*3/uL (ref 0.1–1.0)
Monocytes Relative: 8 %
Neutro Abs: 4.4 10*3/uL (ref 1.7–7.7)
Neutrophils Relative %: 46 %
Platelets: 321 10*3/uL (ref 150–400)
RBC: 5.03 MIL/uL (ref 4.22–5.81)
RDW: 15.5 % (ref 11.5–15.5)
WBC: 9.6 10*3/uL (ref 4.0–10.5)
nRBC: 0 % (ref 0.0–0.2)

## 2021-05-06 LAB — RESP PANEL BY RT-PCR (FLU A&B, COVID) ARPGX2
Influenza A by PCR: NEGATIVE
Influenza B by PCR: NEGATIVE
SARS Coronavirus 2 by RT PCR: NEGATIVE

## 2021-05-06 LAB — TROPONIN I (HIGH SENSITIVITY)
Troponin I (High Sensitivity): 11 ng/L (ref <18)
Troponin I (High Sensitivity): 11 ng/L (ref <18)

## 2021-05-06 LAB — BRAIN NATRIURETIC PEPTIDE: B Natriuretic Peptide: 101.4 pg/mL — ABNORMAL HIGH (ref 0.0–100.0)

## 2021-05-06 MED ORDER — GLUCAGON HCL RDNA (DIAGNOSTIC) 1 MG IJ SOLR
1.0000 mg | Freq: Once | INTRAMUSCULAR | Status: AC
  Administered 2021-05-06: 1 mg via INTRAVENOUS
  Filled 2021-05-06: qty 1

## 2021-05-06 MED ORDER — NITROGLYCERIN 0.4 MG SL SUBL
0.4000 mg | SUBLINGUAL_TABLET | SUBLINGUAL | Status: DC | PRN
  Administered 2021-05-06: 21:00:00 0.4 mg via SUBLINGUAL
  Filled 2021-05-06: qty 1

## 2021-05-06 MED ORDER — METHYLPREDNISOLONE SODIUM SUCC 125 MG IJ SOLR
125.0000 mg | Freq: Once | INTRAMUSCULAR | Status: AC
  Administered 2021-05-06: 125 mg via INTRAVENOUS
  Filled 2021-05-06: qty 2

## 2021-05-06 MED ORDER — IOHEXOL 350 MG/ML SOLN
100.0000 mL | Freq: Once | INTRAVENOUS | Status: AC | PRN
  Administered 2021-05-06: 100 mL via INTRAVENOUS

## 2021-05-06 MED ORDER — LORAZEPAM 2 MG/ML IJ SOLN
0.5000 mg | Freq: Once | INTRAMUSCULAR | Status: AC
  Administered 2021-05-06: 0.5 mg via INTRAVENOUS
  Filled 2021-05-06: qty 1

## 2021-05-06 MED ORDER — FUROSEMIDE 10 MG/ML IJ SOLN
40.0000 mg | Freq: Once | INTRAMUSCULAR | Status: AC
  Administered 2021-05-06: 40 mg via INTRAVENOUS
  Filled 2021-05-06: qty 4

## 2021-05-06 MED ORDER — FENTANYL CITRATE PF 50 MCG/ML IJ SOSY
50.0000 ug | PREFILLED_SYRINGE | Freq: Once | INTRAMUSCULAR | Status: AC
Start: 2021-05-06 — End: 2021-05-06
  Administered 2021-05-06: 50 ug via INTRAVENOUS
  Filled 2021-05-06: qty 1

## 2021-05-06 NOTE — Assessment & Plan Note (Signed)
Patient appears euvolemic.  Do not think this is an episode of systolic/diastolic heart failure but rather acute exacerbation of pulmonary fibrosis.  Patient did receive 1 dose of Lasix in the ER.  He is not normally on diuretic therapy at home.  He follows with [facility] cardiology.

## 2021-05-06 NOTE — Assessment & Plan Note (Signed)
On metformin.  Add sliding scale due to initiation of corticosteroids.

## 2021-05-06 NOTE — Assessment & Plan Note (Signed)
Chronic. 

## 2021-05-06 NOTE — Assessment & Plan Note (Signed)
Patient had a recent tick bite couple months ago.  His alpha gal serologies were positive.  He states that he cannot eat any red meat as he becomes violently ill.  He did have some fried cheese sticks today.  I wonder if the oil that was used to fry his cheese sticks may have also been in contact with any red meat.

## 2021-05-06 NOTE — ED Notes (Addendum)
Pt on 2L [place] per Dr.Allen.

## 2021-05-06 NOTE — Assessment & Plan Note (Signed)
On statin.

## 2021-05-06 NOTE — ED Provider Notes (Signed)
[facility] EMERGENCY DEPARTMENT Provider Note   CSN: [id number] Arrival date & time: 05/06/21  1816     History Chief Complaint  Patient presents with   Chest Pain    Chris [name]. is a 80 y.o. male.  80 year old male presents with acute onset of epigastric pain after eating cheese sticks today.  Does have a history of esophageal stricture in the past.  States that the pain is in his epigastric area and radiates to his back.  He is chronically short of breath and has not changed.  No diaphoresis.  States he feels as if something is stuck when he tries to swallow his saliva.  No treatment use for this prior to arrival.      Past Medical History:  Diagnosis Date   Abnormal exercise myocardial perfusion study    03/2018- 38% ef, see report   Arthritis    Chronic kidney disease    Congestive heart failure (CHF) ([place]) 5/17   EF 35-40% [name]   Coronary artery disease    a. 1995 s/p CABG x 3 (VG->RCA, LIMA->LAD, VG->OM);  b. 07/2008 Inf MI/Cath/PCI: VG->RCA 99 - treated with Taxus DES (16mm), LIMA and VG->OM patent, LAD 100, LCX 100, RCA 60d, EF 50%;  c. 09/2008 PCI native RCA  w/ 2.5x23 Xience DES, VG->RCA stent patent;  c. 05/2010 Cath: Native 3VD with 3/3 patent grafts, native RCA w 80% ISR prox to graft insertion-->Med Rx.   DOE (dyspnea on exertion)    ED (erectile dysfunction)    GERD (gastroesophageal reflux disease)    occ   Gout    H/O hiatal hernia    Hyperlipidemia    Hypertension    Myocardial infarction ([place])    Neuropathy    toes   Obesity    RBBB (right bundle branch block)    Stenosis of right internal carotid artery    50-69% (2014)   Type II diabetes mellitus ([place])    Umbilical hernia    a. s/p repair.   Vertigo     Patient Active Problem List   Diagnosis Date Noted   Unstable angina ([place]) 09/04/2020   Chronic combined systolic and diastolic CHF (congestive heart failure) ([place])    Coronary artery disease involving  native coronary artery of native heart with unstable angina pectoris ([place]) 08/12/2020   OSA (obstructive sleep apnea) 03/25/2020   Nocturnal hypoxia 01/17/2020   DOE (dyspnea on exertion) 01/01/2020   Hiatal hernia 01/01/2020   Pulmonary fibrosis ([place]) 01/01/2020   Abnormal exercise myocardial perfusion study    Congestive heart failure (CHF) ([place])    NSTEMI (non-ST elevated myocardial infarction) ([place]) 02/15/2014   Type II diabetes mellitus ([place])    Arthritis of left ankle 08/17/2013   Stenosis of right internal carotid artery    Precordial pain 06/03/2012   TIA (transient ischemic attack) 10/02/2011   Peripheral neuropathy 04/14/2011   CAD (coronary artery disease) 11/04/2010   Hypertension 11/04/2010   Renal insufficiency 11/04/2010   Obese 11/04/2010   CVA (cerebral vascular accident) ([place]) 11/04/2010   Hyperlipemia 11/04/2010    Past Surgical History:  Procedure Laterality Date   ANKLE ARTHROSCOPY WITH DRILLING/MICROFRACTURE Left 04/13/2013   Procedure: LEFT ANKLE ARTHROSCOPY WITH EXTENSIVE DEBRIEDMENT;  Surgeon: Wylene Simmer, MD;  Location: [place];  Service: Orthopedics;  Laterality: Left;   ANKLE SURGERY Left 08/18/2013   DR HEWITT   CARDIAC CATHETERIZATION  2000   stents x3   CARDIAC CATHETERIZATION  02/15/2014   Procedure: LEFT HEART CATH AND CORS/GRAFTS ANGIOGRAPHY;  Surgeon: Laverda Page, MD;  Location: [facility] CATH LAB;  Service: Cardiovascular;;   COLONOSCOPY     CORONARY ARTERY BYPASS GRAFT  1995   LIMA to LAD, SVG to RCA, SVG to OM.    CORONARY STENT INTERVENTION N/A 08/12/2020   Procedure: CORONARY STENT INTERVENTION;  Surgeon: Nigel Mormon, MD;  Location: [place] CV LAB;  Service: Cardiovascular;  Laterality: N/A;   LEFT HEART CATH AND CORS/GRAFTS ANGIOGRAPHY N/A 08/12/2020   Procedure: LEFT HEART CATH AND CORS/GRAFTS ANGIOGRAPHY;  Surgeon: Nigel Mormon, MD;  Location: [place] CV LAB;  Service: Cardiovascular;  Laterality:  N/A;   TOTAL ANKLE ARTHROPLASTY Left 08/17/2013   Procedure: LEFT TOTAL ANKLE REPLACEMENT WITH POSSIBLE GASTROC RECESSION ;  Surgeon: Wylene Simmer, MD;  Location: [facility];  Service: Orthopedics;  Laterality: Left;   UMBILICAL HERNIA REPAIR  12/2007       Family History  Problem Relation Age of Onset   Aneurysm Mother    Heart disease Father    Heart attack Father    Stroke Brother     Social History   Tobacco Use   Smoking status: Former    Packs/day: 1.00    Years: 9.00    Pack years: 9.00    Types: Cigarettes    Quit date: 10/31/1970    Years since quitting: 50.5   Smokeless tobacco: Former    Types: Chew   Tobacco comments:    chewed for 2 years in his 20's  Vaping Use   Vaping Use: Never used  Substance Use Topics   Alcohol use: Not Currently    Comment: occ wine last 6 months   Drug use: No    Home Medications Prior to Admission medications   Medication Sig Start Date End Date Taking? Authorizing Provider  acetaminophen (TYLENOL) 500 MG tablet Take 500 mg by mouth every 6 (six) hours as needed for mild pain.    [provider]  aspirin EC 81 MG tablet Take 1 tablet (81 mg total) by mouth daily. 02/16/14   Adrian Prows, MD  clopidogrel (PLAVIX) 75 MG tablet Take 1 tablet (75 mg total) by mouth daily. 02/14/21 02/09/22  [name], [name], PA-C  EPINEPHrine 0.3 mg/0.3 mL IJ SOAJ injection Inject 0.3 mg into the muscle as needed for anaphylaxis. 12/12/20   Ripley Fraise, MD  metFORMIN (GLUCOPHAGE-XR) 500 MG 24 hr tablet TAKE 2 TABLETS EVERY DAY WITH BREAKFAST Patient taking differently: 1,000 mg daily with breakfast. 04/04/21   Susy Frizzle, MD  nitroGLYCERIN (NITROSTAT) 0.4 MG SL tablet Place 0.4 mg under the tongue every 5 (five) minutes as needed for chest pain.    [provider]  rosuvastatin (CRESTOR) 40 MG tablet TAKE 1 TABLET EVERY DAY 12/04/20   Adrian Prows, MD    Allergies    Brilinta [ticagrelor], Crestor [rosuvastatin calcium], and Lipitor  [atorvastatin]  Review of Systems   Review of Systems  All other systems reviewed and are negative.  Physical Exam Updated Vital Signs BP (!) 152/88 (BP Location: Right Arm)    Pulse 81    Temp 97.9 F (36.6 C) (Oral)    Resp (!) 32    Ht 1.727 m (5\' 8" )    Wt 105.2 kg    SpO2 99%    BMI 35.28 kg/m   Physical Exam Vitals and nursing note reviewed.  Constitutional:      General: He is not in  acute distress.    Appearance: Normal appearance. He is well-developed. He is not toxic-appearing.  HENT:     Head: Normocephalic and atraumatic.  Eyes:     General: Lids are normal.     Conjunctiva/sclera: Conjunctivae normal.     Pupils: Pupils are equal, round, and reactive to light.  Neck:     Thyroid: No thyroid mass.     Trachea: No tracheal deviation.  Cardiovascular:     Rate and Rhythm: Normal rate and regular rhythm.     Heart sounds: Normal heart sounds. No murmur heard.   No gallop.  Pulmonary:     Effort: Pulmonary effort is normal. No respiratory distress.     Breath sounds: Normal breath sounds. No stridor. No decreased breath sounds, wheezing, rhonchi or rales.  Abdominal:     General: There is no distension.     Palpations: Abdomen is soft.     Tenderness: There is abdominal tenderness in the epigastric area. There is no rebound.  Musculoskeletal:        General: No tenderness. Normal range of motion.     Cervical back: Normal range of motion and neck supple.  Skin:    General: Skin is warm and dry.     Findings: No abrasion or rash.  Neurological:     Mental Status: He is alert and oriented to person, place, and time. Mental status is at baseline.     GCS: GCS eye subscore is 4. GCS verbal subscore is 5. GCS motor subscore is 6.     Cranial Nerves: No cranial nerve deficit.     Sensory: No sensory deficit.     Motor: Motor function is intact.  Psychiatric:        Attention and Perception: Attention normal.        Speech: Speech normal.        Behavior: Behavior  normal.    ED Results / Procedures / Treatments   Labs (all labs ordered are listed, but only abnormal results are displayed) Labs Reviewed  I-STAT CHEM 8, ED - Abnormal; Notable for the following components:      Result Value   Creatinine, Ser 1.30 (*)    Glucose, Bld 133 (*)    Calcium, Ion 1.09 (*)    All other components within normal limits  COMPREHENSIVE METABOLIC PANEL  CBC WITH DIFFERENTIAL/PLATELET  TROPONIN I (HIGH SENSITIVITY)    EKG EKG Interpretation  Date/Time:  Tuesday May 06 2021 18:21:51 EST Ventricular Rate:  94 PR Interval:  160 QRS Duration: 126 QT Interval:  400 QTC Calculation: 500 R Axis:   104 Text Interpretation: Normal sinus rhythm Right bundle branch block Lateral infarct , age undetermined Inferior infarct , age undetermined Abnormal ECG No significant change since last tracing Confirmed by Lacretia Leigh ([id number]) on 05/06/2021 7:16:33 PM  Radiology No results found.  Procedures Procedures   Medications Ordered in ED Medications  nitroGLYCERIN (NITROSTAT) SL tablet 0.4 mg (has no administration in time range)  glucagon (human recombinant) (GLUCAGEN) injection 1 mg (has no administration in time range)  LORazepam (ATIVAN) injection 0.5 mg (has no administration in time range)    ED Course  I have reviewed the triage vital signs and the nursing notes.  Pertinent labs & imaging results that were available during my care of the patient were reviewed by me and considered in my medical decision making (see chart for details).    MDM Rules/Calculators/A&P  Final Clinical Impression(s) / ED Diagnoses Final diagnoses:  None  Concern for initial esophageal impaction and treated for this and can swallow secretions appropriate at this time. Patient's chest x-ray and CT consistent with fluid overload.  Troponin negative here.  EKG without acute ischemic findings.  Given Lasix here.  Will be admitted to the  hospitalist service  CRITICAL CARE Performed by: Leota Jacobsen Total critical care time: 45 minutes Critical care time was exclusive of separately billable procedures and treating other patients. Critical care was necessary to treat or prevent imminent or life-threatening deterioration. Critical care was time spent personally by me on the following activities: development of treatment plan with patient and/or surrogate as well as nursing, discussions with consultants, evaluation of patient's response to treatment, examination of patient, obtaining history from patient or surrogate, ordering and performing treatments and interventions, ordering and review of laboratory studies, ordering and review of radiographic studies, pulse oximetry and re-evaluation of patient's condition.   Rx / DC Orders ED Discharge Orders     None        Lacretia Leigh, MD 05/06/21 2226

## 2021-05-06 NOTE — Assessment & Plan Note (Signed)
Patient not on antihypertensive medications due to prior orthostatic hypotension.  See cardiology notes.

## 2021-05-06 NOTE — ACP (Advance Care Planning) (Signed)
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 Specifically, we discussed whether patient would desire cardiopulmonary resuscitation (CPR) in the event of acute cardiopulmonary arrest. We also discussed whether endotracheal intubation and temporary ventilator life support would be desired in the event of acute cardio- or pulmonary decompensation.   Code status order of DNR has been entered in accord with the patient's wishes. no intubation  Living will: no  Health Care Agent / Davonna Belling              Name: sharon Renier: Relationship to Patient: wife   Is agent appointed in legal document no  Time spent today in ACP discussion was  5 mins  Kristopher Oppenheim, DO [facility]

## 2021-05-06 NOTE — Assessment & Plan Note (Signed)
Admit to medical bed.  IV Solu-Medrol 125 mg once and then start prednisone.  Patient follows with [facility] pulmonology.  He is declined antifibrotic therapy with Ofev.

## 2021-05-06 NOTE — Assessment & Plan Note (Signed)
Stable.  Patient had cardiac cath earlier this year.  Continue with aspirin, statin.

## 2021-05-06 NOTE — ED Triage Notes (Signed)
Patient brought in stating he was eating with his wife having sudden chest pain. Patient hyperventilating and dry heaving in triage. Points to left side of chest radiating up left side of neck.

## 2021-05-06 NOTE — ED Notes (Signed)
Patient transported to CT 

## 2021-05-06 NOTE — Subjective & Objective (Signed)
Chief complaint: Shortness of breath History of present illness: 80 year old male with a history obesity, coronary disease, hypertension, hypokalemia, idiopathic pulmonary fibrosis presents the ER today with sudden onset of shortness of breath and some chest pain.  Patient states he was at [employer] and having an appetizer of fried cheese sticks when he started feeling acutely short of breath.  He started having some difficulty with coughing and regurgitation.  He was brought to the ER for evaluation.  CTPA was done to rule out dissection based on his symptoms (??).  This did not show any dissection.  Patient has chronic changes consistent with blood fibrosis.  He has a large hiatal hernia that was seen.  Patient was short of breath but not hypoxic.  O2 started for comfort.  Laboratory evaluation BNP was 101.4.  Interestingly, the patient had a BMP in July 2022 of 142.  He was not in acute heart failure at that point either.  Rest of his labs are premature unremarkable.  Due to his shortness of breath and concern for possible CHF exacerbation, [facility] contacted for admission.

## 2021-05-06 NOTE — ED Notes (Signed)
This RN gave report to Vinnie Level, Therapist, sports and mentioned pt needs a monitor and call bell in room

## 2021-05-06 NOTE — H&P (Signed)
History and Physical    Chris [name]. NIO:[id number] DOB: 1941/01/31 DOA: 05/06/2021  PCP: Susy Frizzle, MD   Patient coming from: Home  I have personally briefly reviewed patient's old medical records in [place]  Chief complaint: Shortness of breath History of present illness: 80 year old male with a history obesity, coronary disease, hypertension, hypokalemia, idiopathic pulmonary fibrosis presents the ER today with sudden onset of shortness of breath and some chest pain.  Patient states he was at [employer] and having an appetizer of fried cheese sticks when he started feeling acutely short of breath.  He started having some difficulty with coughing and regurgitation.  He was brought to the ER for evaluation.  CTPA was done to rule out dissection based on his symptoms (??).  This did not show any dissection.  Patient has chronic changes consistent with blood fibrosis.  He has a large hiatal hernia that was seen.  Patient was short of breath but not hypoxic.  O2 started for comfort.  Laboratory evaluation BNP was 101.4.  Interestingly, the patient had a BMP in July 2022 of 142.  He was not in acute heart failure at that point either.  Rest of his labs are premature unremarkable.  Due to his shortness of breath and concern for possible CHF exacerbation, [facility] hospitalist contacted for admission.   ED Course: not hypoxic on arrival. CTPA negative for PE or dissection.  Patient given some IV glucagon due to concern for possible food bolus.  Patient's breathing has improved.  He was also given IV Lasix due to concern for possible CHF exacerbation.  He is urinating well now.  Review of Systems:  Review of Systems  Constitutional: Negative.   HENT: Negative.    Eyes: Negative.   Respiratory:  Positive for cough, sputum production and shortness of breath. Negative for wheezing.   Cardiovascular:  Positive for chest pain.       Chest pain started after eating some  fried mozzarella cheese sticks.  Gastrointestinal:  Positive for abdominal pain.  Genitourinary: Negative.   Musculoskeletal: Negative.   Skin: Negative.   Neurological: Negative.   Endo/Heme/Allergies: Negative.   Psychiatric/Behavioral: Negative.    All other systems reviewed and are negative.  Past Medical History:  Diagnosis Date   Abnormal exercise myocardial perfusion study    03/2018- 38% ef, see report   Arthritis    Chronic kidney disease    Congestive heart failure (CHF) ([place]) 5/17   EF 35-40% [name]   Coronary artery disease    a. 1995 s/p CABG x 3 (VG->RCA, LIMA->LAD, VG->OM);  b. 07/2008 Inf MI/Cath/PCI: VG->RCA 99 - treated with Taxus DES (58mm), LIMA and VG->OM patent, LAD 100, LCX 100, RCA 60d, EF 50%;  c. 09/2008 PCI native RCA  w/ 2.5x23 Xience DES, VG->RCA stent patent;  c. 05/2010 Cath: Native 3VD with 3/3 patent grafts, native RCA w 80% ISR prox to graft insertion-->Med Rx.   DOE (dyspnea on exertion)    ED (erectile dysfunction)    GERD (gastroesophageal reflux disease)    occ   Gout    H/O hiatal hernia    Hyperlipidemia    Hypertension    Myocardial infarction ([place])    Neuropathy    toes   Obesity    RBBB (right bundle branch block)    Stenosis of right internal carotid artery    50-69% (2014)   Type II diabetes mellitus ([place])    Umbilical hernia  a. s/p repair.   Vertigo     Past Surgical History:  Procedure Laterality Date   ANKLE ARTHROSCOPY WITH DRILLING/MICROFRACTURE Left 04/13/2013   Procedure: LEFT ANKLE ARTHROSCOPY WITH EXTENSIVE DEBRIEDMENT;  Surgeon: Wylene Simmer, MD;  Location: [facility];  Service: Orthopedics;  Laterality: Left;   ANKLE SURGERY Left 08/18/2013   DR HEWITT   CARDIAC CATHETERIZATION  2000   stents x3   CARDIAC CATHETERIZATION  02/15/2014   Procedure: LEFT HEART CATH AND CORS/GRAFTS ANGIOGRAPHY;  Surgeon: Laverda Page, MD;  Location: [facility] C[facility] LAB;  Service: Cardiovascular;;   COLONOSCOPY      CORONARY ARTERY BYPASS GRAFT  1995   LIMA to LAD, SVG to RCA, SVG to OM.    CORONARY STENT INTERVENTION N/A 08/12/2020   Procedure: CORONARY STENT INTERVENTION;  Surgeon: Nigel Mormon, MD;  Location: [facility] CV LAB;  Service: Cardiovascular;  Laterality: N/A;   LEFT HEART CATH AND CORS/GRAFTS ANGIOGRAPHY N/A 08/12/2020   Procedure: LEFT HEART CATH AND CORS/GRAFTS ANGIOGRAPHY;  Surgeon: Nigel Mormon, MD;  Location: [facility] CV LAB;  Service: Cardiovascular;  Laterality: N/A;   TOTAL ANKLE ARTHROPLASTY Left 08/17/2013   Procedure: LEFT TOTAL ANKLE REPLACEMENT WITH POSSIBLE GASTROC RECESSION ;  Surgeon: Wylene Simmer, MD;  Location: [facility];  Service: Orthopedics;  Laterality: Left;   UMBILICAL HERNIA REPAIR  12/2007     reports that he quit smoking about 50 years ago. His smoking use included cigarettes. He has a 9.00 pack-year smoking history. He has quit using smokeless tobacco.  His smokeless tobacco use included chew. He reports that he does not currently use alcohol. He reports that he does not use drugs.  Allergies  Allergen Reactions   Brilinta [Ticagrelor] Shortness Of Breath   Crestor [Rosuvastatin Calcium] Other (See Comments)    Muscle pain- tolerating this 2022, however   Lipitor [Atorvastatin] Other (See Comments)    Intolerable muscle pain all over     Family History  Problem Relation Age of Onset   Aneurysm Mother    Heart disease Father    Heart attack Father    Stroke Brother     Prior to Admission medications   Medication Sig Start Date End Date Taking? Authorizing Provider  acetaminophen (TYLENOL) 500 MG tablet Take 500 mg by mouth every 6 (six) hours as needed for mild pain.   Yes [provider]  aspirin EC 81 MG tablet Take 1 tablet (81 mg total) by mouth daily. 02/16/14  Yes Adrian Prows, MD  clopidogrel (PLAVIX) 75 MG tablet Take 1 tablet (75 mg total) by mouth daily. 02/14/21 02/09/22 Yes [name], [name], PA-C  diphenhydrAMINE HCl  (BENADRYL ALLERGY PO) Take 1 tablet by mouth as needed (hives).   Yes [provider]  EPINEPHrine 0.3 mg/0.3 mL IJ SOAJ injection Inject 0.3 mg into the muscle as needed for anaphylaxis. 12/12/20  Yes Ripley Fraise, MD  metFORMIN (GLUCOPHAGE-XR) 500 MG 24 hr tablet TAKE 2 TABLETS EVERY DAY WITH BREAKFAST Patient taking differently: 1,000 mg daily with breakfast. 04/04/21  Yes Susy Frizzle, MD  nitroGLYCERIN (NITROSTAT) 0.4 MG SL tablet Place 0.4 mg under the tongue every 5 (five) minutes as needed for chest pain.   Yes [provider]  rosuvastatin (CRESTOR) 40 MG tablet TAKE 1 TABLET EVERY DAY Patient taking differently: Take 40 mg by mouth daily. 12/04/20  Yes Adrian Prows, MD  Tamsulosin HCl (FLOMAX PO) Take 1 capsule by mouth at bedtime as needed (bladder).  Yes [provider]  triamcinolone (KENALOG) 0.025 % ointment Apply 1 application topically 2 (two) times daily as needed (itching).   Yes [provider]    Physical Exam: Vitals:   05/06/21 2145 05/06/21 2200 05/06/21 2215 05/06/21 2230  BP: (!) 136/91 (!) 143/91 (!) 154/87 (!) 149/101  Pulse: 92 92 89 94  Resp: 18 17 17 19   Temp:      TempSrc:      SpO2: 97% 98% 99% 99%  Weight:      Height:        Physical Exam Vitals and nursing note reviewed.  Constitutional:      General: He is not in acute distress.    Appearance: He is obese. He is not ill-appearing, toxic-appearing or diaphoretic.  HENT:     Head: Normocephalic and atraumatic.     Nose: Nose normal. No rhinorrhea.  Eyes:     General:        Right eye: No discharge.        Left eye: No discharge.  Cardiovascular:     Rate and Rhythm: Normal rate and regular rhythm.     Pulses: Normal pulses.  Pulmonary:     Effort: No respiratory distress.     Comments: Dry rales in all lung fields. Abdominal:     General: Bowel sounds are normal. There is no distension.     Palpations: Abdomen is soft.     Tenderness: There is no  abdominal tenderness. There is no guarding or rebound.  Musculoskeletal:     Right lower leg: No edema.     Left lower leg: No edema.  Skin:    General: Skin is warm and dry.     Capillary Refill: Capillary refill takes less than 2 seconds.  Neurological:     General: No focal deficit present.     Mental Status: He is alert and oriented to person, place, and time.     Labs on Admission: I have personally reviewed following labs and imaging studies  CBC: Recent Labs  Lab 05/06/21 1843 05/06/21 1847  WBC 9.6  --   NEUTROABS 4.4  --   HGB 13.5 15.0  HCT 43.3 44.0  MCV 86.1  --   PLT 321  --    Basic Metabolic Panel: Recent Labs  Lab 05/06/21 1843 05/06/21 1847  NA 137 140  K 5.0 4.9  CL 104 105  CO2 21*  --   GLUCOSE 133* 133*  BUN 16 18  CREATININE 1.33* 1.30*  CALCIUM 9.8  --    GFR: Estimated Creatinine Clearance: 55 mL/min (A) (by C-G formula based on SCr of 1.3 mg/dL (H)). Liver Function Tests: Recent Labs  Lab 05/06/21 1843  AST 32  ALT 21  ALKPHOS 44  BILITOT 0.7  PROT 8.3*  ALBUMIN 4.0   No results for input(s): LIPASE, AMYLASE in the last 168 hours. No results for input(s): AMMONIA in the last 168 hours. Coagulation Profile: No results for input(s): INR, PROTIME in the last 168 hours. Cardiac Enzymes: No results for input(s): CKTOTAL, CKMB, CKMBINDEX, TROPONINI in the last 168 hours. BNP (last 3 results) No results for input(s): PROBNP in the last 8760 hours. HbA1C: No results for input(s): HGBA1C in the last 72 hours. CBG: No results for input(s): GLUCAP in the last 168 hours. Lipid Profile: No results for input(s): CHOL, HDL, LDLCALC, TRIG, CHOLHDL, LDLDIRECT in the last 72 hours. Thyroid Function Tests: No results for input(s): TSH, T4TOTAL, FREET4,  T3FREE, THYROIDAB in the last 72 hours. Anemia Panel: No results for input(s): VITAMINB12, FOLATE, FERRITIN, TIBC, IRON, RETICCTPCT in the last 72 hours. Urine analysis:    Component Value  Date/Time   COLORURINE YELLOW 10/15/2020 [place] 10/15/2020 1321   LABSPEC 1.020 10/15/2020 1321   PHURINE 5.5 10/15/2020 1321   GLUCOSEU NEGATIVE 10/15/2020 1321   HGBUR NEGATIVE 10/15/2020 1321   BILIRUBINUR NEGATIVE 10/15/2020 1321   [place] (A) 10/15/2020 1321   PROTEINUR NEGATIVE 10/15/2020 1321   NITRITE NEGATIVE 10/15/2020 [place] 10/15/2020 1321    Radiological Exams on Admission: I have personally reviewed images DG Chest Port 1 View  Result Date: 05/06/2021 CLINICAL DATA:  Sudden onset chest pain, hyperventilation EXAM: PORTABLE CHEST 1 VIEW COMPARISON:  05/06/2021 FINDINGS: Single frontal view of the chest demonstrates stable enlargement the cardiac silhouette. Postsurgical changes from CABG. There is central vascular congestion, with diffuse interstitial prominence and scattered basilar ground-glass airspace disease consistent with edema. No large effusion or pneumothorax. No acute bony abnormalities. IMPRESSION: 1. Mild volume overload. Electronically Signed   By: Randa Ngo M.D.   On: 05/06/2021 20:24   CT Angio Chest/Abd/Pel for Dissection W and/or Wo Contrast  Result Date: 05/06/2021 CLINICAL DATA:  Acute aortic syndrome suspected.  Chest pain EXAM: CT ANGIOGRAPHY CHEST, ABDOMEN AND PELVIS TECHNIQUE: Non-contrast CT of the chest was initially obtained. Multidetector CT imaging through the chest, abdomen and pelvis was performed using the standard protocol during bolus administration of intravenous contrast. Multiplanar reconstructed images and MIPs were obtained and reviewed to evaluate the vascular anatomy. CONTRAST:  140mL OMNIPAQUE IOHEXOL 350 MG/ML SOLN COMPARISON:  12/04/2020 FINDINGS: CTA CHEST FINDINGS Cardiovascular: No filling defects in the pulmonary arteries to suggest pulmonary emboli. Heart is normal size. Aorta is normal caliber. Diffusely calcified coronary arteries. Prior CABG. Scattered aortic calcifications.  No aneurysm or dissection. Mediastinum/Nodes: No mediastinal, hilar, or axillary adenopathy. Trachea and esophagus are unremarkable. Thyroid unremarkable. Large hiatal hernia. Lungs/Pleura: Chronic areas of scarring. Scattered ground-glass opacities could reflect early edema. No effusions. Musculoskeletal: Chest wall soft tissues are unremarkable. Review of the MIP images confirms the above findings. CTA ABDOMEN AND PELVIS FINDINGS VASCULAR Aorta: Aortic atherosclerosis.  No aneurysm or dissection. Celiac: Patent without evidence of aneurysm, dissection, vasculitis or significant stenosis. SMA: Patent without evidence of aneurysm, dissection, vasculitis or significant stenosis. Renals: Both renal arteries are patent without evidence of aneurysm, dissection, vasculitis, fibromuscular dysplasia or significant stenosis. IMA: Patent without evidence of aneurysm, dissection, vasculitis or significant stenosis. Inflow: Scattered calcifications.  No aneurysm or dissection. Veins: No obvious venous abnormality within the limitations of this arterial phase study. Review of the MIP images confirms the above findings. NON-VASCULAR Hepatobiliary: No focal hepatic abnormality. Gallbladder unremarkable. Pancreas: No focal abnormality or ductal dilatation. Spleen: No focal abnormality.  Normal size. Adrenals/Urinary Tract: 4 cm cyst in the midpole of the right kidney. 4 mm calcification in the lower pole of the right kidney. No hydronephrosis. Adrenal glands and urinary bladder unremarkable. Stomach/Bowel: Sigmoid diverticulosis. No active diverticulitis. Stomach and small bowel decompressed, unremarkable. Normal appendix Lymphatic: No adenopathy Reproductive: Mildly prominent prostate. Other: No free fluid or free air. Musculoskeletal: No acute bony abnormality. Review of the MIP images confirms the above findings. IMPRESSION: No evidence of aortic aneurysm or dissection. No evidence of pulmonary embolus. Scattered ground-glass  opacities in the lungs could reflect early edema. Prior CABG. Large hiatal hernia. Sigmoid diverticulosis. Electronically Signed   By: Rolm Baptise  M.D.   On: 05/06/2021 20:10    EKG: I have personally reviewed EKG: NSR    Assessment/Plan Principal Problem:   Acute exacerbation of idiopathic pulmonary fibrosis (HCC) Active Problems:   CAD (coronary artery disease)   Hypertension   Obese   Hyperlipemia   Type II diabetes mellitus (HCC)   Chronic combined systolic and diastolic CHF (congestive heart failure) (HCC)   OSA (obstructive sleep apnea)   Allergic reaction to alpha-gal    Acute exacerbation of idiopathic pulmonary fibrosis ([place]) Admit to medical bed.  IV Solu-Medrol 125 mg once and then start prednisone.  Patient [facility] with [facility] pulmonology.  He is declined antifibrotic therapy with Ofev.  CAD (coronary artery disease) Stable.  Patient had cardiac cath earlier this year.  Continue with aspirin, statin.  Hypertension Patient not on antihypertensive medications due to prior orthostatic hypotension.  See cardiology notes.  Obese Chronic.  Hyperlipemia On statin.  Type II diabetes mellitus ([place]) On metformin.  Add sliding scale due to initiation of corticosteroids.  Chronic combined systolic and diastolic CHF (congestive heart failure) ([place]) Patient appears euvolemic.  Do not think this is an episode of systolic/diastolic heart failure but rather acute exacerbation of pulmonary fibrosis.  Patient did receive 1 dose of Lasix in the ER.  He is not normally on diuretic therapy at home.  He follows with [facility] cardiology.  OSA (obstructive sleep apnea) Chronic.  Allergic reaction to alpha-gal Patient had a recent tick bite couple months ago.  His alpha gal serologies were positive.  He states that he cannot eat any red meat as he becomes violently ill.  He did have some fried cheese sticks today.  I wonder if the oil that was used to fry his cheese sticks may have  also been in contact with any red meat.  DVT prophylaxis: Lovenox Code Status: DNR/DNI(Do NOT Intubate) verified with patient Family Communication: no family at bedside  Disposition Plan: return home  Consults called: home Admission status: Observation, Med-Surg   Kristopher Oppenheim, DO [facility] 05/06/2021, 11:17 PM

## 2021-05-06 NOTE — ED Provider Notes (Signed)
Emergency Medicine Provider Triage Evaluation Note  Chris [name]. , a 80 y.o. male  was evaluated in triage.  Pt complains of acute onset chest pain that radiates into his posterior neck with associated nausea vomiting.  Patient reports this is similar to his previous MI.  Review of Systems  Positive: As above Negative: Palpitations  Physical Exam  BP (!) 152/88 (BP Location: Right Arm)    Pulse 81    Temp 97.9 F (36.6 C) (Oral)    Resp (!) 32    Ht 5\' 8"  (1.727 m)    Wt 105.2 kg    SpO2 99%    BMI 35.28 kg/m  Gen:   Awake, no distress   Resp:  Normal effort  MSK:   Moves extremities without difficulty  Other:  Abdomen tender to palpation  Medical Decision Making  Medically screening exam initiated at 6:29 PM.  Appropriate orders placed.  Chris [name]. was informed that the remainder of the evaluation will be completed by another provider, this initial triage assessment does not replace that evaluation, and the importance of remaining in the ED until their evaluation is complete.  Given description of chest pain and significant abdominal pain on palpation which is new we will order dissection study.  Will give nitro but hold off on aspirin until patient gets room and has CT scan.  Asymmetric blood pressure readings and upper extremities.   Evlyn Courier, PA-C 05/06/21 1833    Lacretia Leigh, MD 05/09/21 [phone]

## 2021-05-07 DIAGNOSIS — J84112 Idiopathic pulmonary fibrosis: Secondary | ICD-10-CM | POA: Diagnosis not present

## 2021-05-07 LAB — RESPIRATORY PANEL BY PCR

## 2021-05-07 LAB — COMPREHENSIVE METABOLIC PANEL
ALT: 17 U/L (ref 0–44)
AST: 26 U/L (ref 15–41)
Albumin: 3.7 g/dL (ref 3.5–5.0)
Alkaline Phosphatase: 45 U/L (ref 38–126)
Anion gap: 9 (ref 5–15)
BUN: 16 mg/dL (ref 8–23)
CO2: 24 mmol/L (ref 22–32)
Calcium: 9.5 mg/dL (ref 8.9–10.3)
Chloride: 102 mmol/L (ref 98–111)
Creatinine, Ser: 1.41 mg/dL — ABNORMAL HIGH (ref 0.61–1.24)
GFR, Estimated: 51 mL/min — ABNORMAL LOW (ref >60)
Glucose, Bld: 162 mg/dL — ABNORMAL HIGH (ref 70–99)
Potassium: 4.4 mmol/L (ref 3.5–5.1)
Sodium: 135 mmol/L (ref 135–145)
Total Bilirubin: 0.4 mg/dL (ref 0.3–1.2)
Total Protein: 8 g/dL (ref 6.5–8.1)

## 2021-05-07 LAB — CBC WITH DIFFERENTIAL/PLATELET
Abs Immature Granulocytes: 0.03 10*3/uL (ref 0.00–0.07)
Basophils Absolute: 0 10*3/uL (ref 0.0–0.1)
Basophils Relative: 0 %
Eosinophils Absolute: 0 10*3/uL (ref 0.0–0.5)
Eosinophils Relative: 0 %
HCT: 41.4 % (ref 39.0–52.0)
Hemoglobin: 13.2 g/dL (ref 13.0–17.0)
Immature Granulocytes: 0 %
Lymphocytes Relative: 15 %
Lymphs Abs: 1.5 10*3/uL (ref 0.7–4.0)
MCH: 26.9 pg (ref 26.0–34.0)
MCHC: 31.9 g/dL (ref 30.0–36.0)
MCV: 84.3 fL (ref 80.0–100.0)
Monocytes Absolute: 0.1 10*3/uL (ref 0.1–1.0)
Monocytes Relative: 1 %
Neutro Abs: 8.3 10*3/uL — ABNORMAL HIGH (ref 1.7–7.7)
Neutrophils Relative %: 84 %
Platelets: 302 10*3/uL (ref 150–400)
RBC: 4.91 MIL/uL (ref 4.22–5.81)
RDW: 15.5 % (ref 11.5–15.5)
WBC: 9.9 10*3/uL (ref 4.0–10.5)
nRBC: 0 % (ref 0.0–0.2)

## 2021-05-07 LAB — CBG MONITORING, ED
Glucose-Capillary: 188 mg/dL — ABNORMAL HIGH (ref 70–99)
Glucose-Capillary: 167 mg/dL — ABNORMAL HIGH (ref 70–99)

## 2021-05-07 LAB — HEMOGLOBIN A1C
Hgb A1c MFr Bld: 7.1 % — ABNORMAL HIGH (ref 4.8–5.6)
Mean Plasma Glucose: 157.07 mg/dL

## 2021-05-07 LAB — MAGNESIUM: Magnesium: 2.2 mg/dL (ref 1.7–2.4)

## 2021-05-07 MED ORDER — INSULIN ASPART 100 UNIT/ML IJ SOLN
0.0000 [IU] | Freq: Three times a day (TID) | INTRAMUSCULAR | Status: DC
  Administered 2021-05-07 (×2): 3 [IU] via SUBCUTANEOUS

## 2021-05-07 MED ORDER — ROSUVASTATIN CALCIUM 20 MG PO TABS
40.0000 mg | ORAL_TABLET | Freq: Every day | ORAL | Status: DC
  Administered 2021-05-07: 09:00:00 40 mg via ORAL
  Filled 2021-05-07: qty 2

## 2021-05-07 MED ORDER — PREDNISONE 20 MG PO TABS
40.0000 mg | ORAL_TABLET | Freq: Every day | ORAL | Status: DC
  Administered 2021-05-07: 08:00:00 40 mg via ORAL
  Filled 2021-05-07: qty 2

## 2021-05-07 MED ORDER — CLOPIDOGREL BISULFATE 75 MG PO TABS
75.0000 mg | ORAL_TABLET | Freq: Every day | ORAL | Status: DC
  Administered 2021-05-07: 09:00:00 75 mg via ORAL
  Filled 2021-05-07: qty 1

## 2021-05-07 MED ORDER — INSULIN ASPART 100 UNIT/ML IJ SOLN: 0.0000 [IU] | Freq: Every day | INTRAMUSCULAR | Status: DC

## 2021-05-07 MED ORDER — ASPIRIN EC 81 MG PO TBEC
81.0000 mg | DELAYED_RELEASE_TABLET | Freq: Every day | ORAL | Status: DC
  Administered 2021-05-07: 09:00:00 81 mg via ORAL
  Filled 2021-05-07: qty 1

## 2021-05-07 MED ORDER — ALBUTEROL SULFATE (2.5 MG/3ML) 0.083% IN NEBU: 2.5000 mg | INHALATION_SOLUTION | RESPIRATORY_TRACT | Status: DC | PRN

## 2021-05-07 MED ORDER — ENOXAPARIN SODIUM 40 MG/0.4ML IJ SOSY
40.0000 mg | PREFILLED_SYRINGE | INTRAMUSCULAR | Status: DC
  Administered 2021-05-07: 13:00:00 40 mg via SUBCUTANEOUS
  Filled 2021-05-07: qty 0.4

## 2021-05-07 MED ORDER — TAMSULOSIN HCL 0.4 MG PO CAPS: 0.4000 mg | ORAL_CAPSULE | Freq: Every evening | ORAL | Status: DC | PRN

## 2021-05-07 NOTE — ED Notes (Signed)
Patient 02 was 91% walking without 02.

## 2021-05-07 NOTE — Discharge Summary (Signed)
Physician Discharge Summary  Chris [name]. ZJI:[id number] DOB: 10-Oct-1940 DOA: 05/06/2021  PCP: Susy Frizzle, MD  Admit date: 05/06/2021 Discharge date: 05/07/2021  Admitted From: Home Disposition: Home  Recommendations for Outpatient Follow-up:  Follow up with PCP in 1-2 weeks Please obtain BMP/CBC in one week Please follow up with GI as discussed  Home Health: None Equipment/Devices: None  Discharge Condition: Stable CODE STATUS: Full Diet recommendation: Low-salt low-fat bland diet  Brief/Interim Summary: 80 year old male with a history obesity, coronary disease, hypertension, hypokalemia, idiopathic pulmonary fibrosis presents the ER today with sudden onset of shortness of breath and some chest pain after eating "too much" cheese.  Ultimately had coughing with regurgitation of food.  Subsequently had some dyspnea without hypoxia, CTA in the ED ruled out dissection but did show large hiatal hernia.  Given patient is not hypoxic symptoms with negative troponin EKG changes and large hiatal hernia we discussed his symptoms are likely secondary to obstructive issues surrounding hiatal hernia.  Patient will follow-up with outpatient GI as well as surgery for further discussion about possible need for intervention but we discussed that with dietary changes he may not need surgical intervention given his age and comorbid conditions he would prefer to avoid any surgery or procedures which is certainly reasonable.  Patient was ambulated to ensure no ongoing hypoxia in the setting of advanced pulmonary fibrosis, able to ambulate without hypoxia around the ED unit.  Discharge home with close follow-up with GI surgery and PCP as scheduled.  Symptomatic hiatal hernia  See above  Idiopathic pulmonary fibrosis ([place]) Acute exacerbation ruled out Follow-up at [facility] pulmonology.  He is declined antifibrotic therapy with Ofev.   CAD (coronary artery disease) Stable.  Patient had  cardiac cath earlier this year.  Continue with aspirin, statin.   Hypertension Patient not on antihypertensive medications due to prior orthostatic hypotension.  See cardiology notes.   Obese Chronic.   Hyperlipemia On statin.   Type II diabetes mellitus ([place]) On metformin.  Add sliding scale due to initiation of corticosteroids.   Chronic combined systolic and diastolic CHF (congestive heart failure) ([place]) Patient appears euvolemic.  Follow-up outpatient cardiology   OSA (obstructive sleep apnea) Chronic.   Allergic reaction to alpha-gal Patient had a recent tick bite couple months ago.  His alpha gal serologies were positive.  He states that he cannot eat any red meat as he becomes violently ill.  He did have some fried cheese sticks today.  I wonder if the oil that was used to fry his cheese sticks may have also been in contact with any red meat.   Discharge Instructions   Allergies as of 05/07/2021       Reactions   Brilinta [ticagrelor] Shortness Of Breath   Alpha-gal Hives   Crestor [rosuvastatin Calcium] Other (See Comments)   Muscle pain- tolerating this 2022, however   Lipitor [atorvastatin] Other (See Comments)   Intolerable muscle pain all over         Medication List     TAKE these medications    acetaminophen 500 MG tablet Commonly known as: TYLENOL Take 500 mg by mouth every 6 (six) hours as needed for mild pain.   aspirin EC 81 MG tablet Take 1 tablet (81 mg total) by mouth daily.   BENADRYL ALLERGY PO Take 1 tablet by mouth as needed (hives).   clopidogrel 75 MG tablet Commonly known as: PLAVIX Take 1 tablet (75 mg total) by mouth daily.   EPINEPHrine 0.3  mg/0.3 mL Soaj injection Commonly known as: EPI-PEN Inject 0.3 mg into the muscle as needed for anaphylaxis.   FLOMAX PO Take 1 capsule by mouth at bedtime as needed (bladder).   metFORMIN 500 MG 24 hr tablet Commonly known as: GLUCOPHAGE-XR TAKE 2 TABLETS EVERY DAY WITH  BREAKFAST What changed: See the new instructions.   nitroGLYCERIN 0.4 MG SL tablet Commonly known as: NITROSTAT Place 0.4 mg under the tongue every 5 (five) minutes as needed for chest pain.   rosuvastatin 40 MG tablet Commonly known as: CRESTOR TAKE 1 TABLET EVERY DAY   triamcinolone 0.025 % ointment Commonly known as: KENALOG Apply 1 application topically 2 (two) times daily as needed (itching).        Allergies  Allergen Reactions   Brilinta [Ticagrelor] Shortness Of Breath   Alpha-Gal Hives   Crestor [Rosuvastatin Calcium] Other (See Comments)    Muscle pain- tolerating this 2022, however   Lipitor [Atorvastatin] Other (See Comments)    Intolerable muscle pain all over     Consultations: None  Procedures/Studies: DG Chest Port 1 View  Result Date: 05/06/2021 CLINICAL DATA:  Sudden onset chest pain, hyperventilation EXAM: PORTABLE CHEST 1 VIEW COMPARISON:  05/06/2021 FINDINGS: Single frontal view of the chest demonstrates stable enlargement the cardiac silhouette. Postsurgical changes from CABG. There is central vascular congestion, with diffuse interstitial prominence and scattered basilar ground-glass airspace disease consistent with edema. No large effusion or pneumothorax. No acute bony abnormalities. IMPRESSION: 1. Mild volume overload. Electronically Signed   By: Randa Ngo M.D.   On: 05/06/2021 20:24   CT Angio Chest/Abd/Pel for Dissection W and/or Wo Contrast  Result Date: 05/06/2021 CLINICAL DATA:  Acute aortic syndrome suspected.  Chest pain EXAM: CT ANGIOGRAPHY CHEST, ABDOMEN AND PELVIS TECHNIQUE: Non-contrast CT of the chest was initially obtained. Multidetector CT imaging through the chest, abdomen and pelvis was performed using the standard protocol during bolus administration of intravenous contrast. Multiplanar reconstructed images and MIPs were obtained and reviewed to evaluate the vascular anatomy. CONTRAST:  165mL OMNIPAQUE IOHEXOL 350 MG/ML SOLN  COMPARISON:  12/04/2020 FINDINGS: CTA CHEST FINDINGS Cardiovascular: No filling defects in the pulmonary arteries to suggest pulmonary emboli. Heart is normal size. Aorta is normal caliber. Diffusely calcified coronary arteries. Prior CABG. Scattered aortic calcifications. No aneurysm or dissection. Mediastinum/Nodes: No mediastinal, hilar, or axillary adenopathy. Trachea and esophagus are unremarkable. Thyroid unremarkable. Large hiatal hernia. Lungs/Pleura: Chronic areas of scarring. Scattered ground-glass opacities could reflect early edema. No effusions. Musculoskeletal: Chest wall soft tissues are unremarkable. Review of the MIP images confirms the above findings. CTA ABDOMEN AND PELVIS FINDINGS VASCULAR Aorta: Aortic atherosclerosis.  No aneurysm or dissection. Celiac: Patent without evidence of aneurysm, dissection, vasculitis or significant stenosis. SMA: Patent without evidence of aneurysm, dissection, vasculitis or significant stenosis. Renals: Both renal arteries are patent without evidence of aneurysm, dissection, vasculitis, fibromuscular dysplasia or significant stenosis. IMA: Patent without evidence of aneurysm, dissection, vasculitis or significant stenosis. Inflow: Scattered calcifications.  No aneurysm or dissection. Veins: No obvious venous abnormality within the limitations of this arterial phase study. Review of the MIP images confirms the above findings. NON-VASCULAR Hepatobiliary: No focal hepatic abnormality. Gallbladder unremarkable. Pancreas: No focal abnormality or ductal dilatation. Spleen: No focal abnormality.  Normal size. Adrenals/Urinary Tract: 4 cm cyst in the midpole of the right kidney. 4 mm calcification in the lower pole of the right kidney. No hydronephrosis. Adrenal glands and urinary bladder unremarkable. Stomach/Bowel: Sigmoid diverticulosis. No active diverticulitis. Stomach and small bowel decompressed,  unremarkable. Normal appendix Lymphatic: No adenopathy Reproductive:  Mildly prominent prostate. Other: No free fluid or free air. Musculoskeletal: No acute bony abnormality. Review of the MIP images confirms the above findings. IMPRESSION: No evidence of aortic aneurysm or dissection. No evidence of pulmonary embolus. Scattered ground-glass opacities in the lungs could reflect early edema. Prior CABG. Large hiatal hernia. Sigmoid diverticulosis. Electronically Signed   By: Rolm Baptise M.D.   On: 05/06/2021 20:10     Subjective: No acute issues or events overnight, symptoms resolved, tolerating p.o. now without any difficulty otherwise denies chest pain shortness of breath headache fevers chills nausea vomiting diarrhea or constipation.   Discharge Exam: Vitals:   05/07/21 1246 05/07/21 1325  BP:  131/68  Pulse:  86  Resp:  16  Temp: 97.9 F (36.6 C)   SpO2:  94%   Vitals:   05/07/21 1215 05/07/21 1230 05/07/21 1246 05/07/21 1325  BP: 120/81 131/87  131/68  Pulse: 91 88  86  Resp: (!) 22 11  16   Temp:   97.9 F (36.6 C)   TempSrc:   Oral   SpO2: 95% 95%  94%  Weight:      Height:        General: Pt is alert, awake, not in acute distress Cardiovascular: RRR, S1/S2 +, no rubs, no gallops Respiratory: CTA bilaterally, no wheezing, no rhonchi Abdominal: Soft, NT, ND, bowel sounds + Extremities: no edema, no cyanosis    The results of significant diagnostics from this hospitalization (including imaging, microbiology, ancillary and laboratory) are listed below for reference.     Microbiology: Recent Results (from the past 240 hour(s))  Resp Panel by RT-PCR (Flu A&B, Covid) Nasopharyngeal Swab     Status: None   Collection Time: 05/06/21 10:50 PM   Specimen: Nasopharyngeal Swab; Nasopharyngeal(NP) swabs in vial transport medium  Result Value Ref Range Status   SARS Coronavirus 2 by RT PCR NEGATIVE NEGATIVE Final    Comment: (NOTE) SARS-CoV-2 target nucleic acids are NOT DETECTED.  The SARS-CoV-2 RNA is generally detectable in upper  respiratory specimens during the acute phase of infection. The lowest concentration of SARS-CoV-2 viral copies this assay can detect is 138 copies/mL. A negative result does not preclude SARS-Cov-2 infection and should not be used as the sole basis for treatment or other patient management decisions. A negative result may occur with  improper specimen collection/handling, submission of specimen other than nasopharyngeal swab, presence of viral mutation(s) within the areas targeted by this assay, and inadequate number of viral copies(<138 copies/mL). A negative result must be combined with clinical observations, patient history, and epidemiological information. The expected result is Negative.  Fact Sheet for Patients:  EntrepreneurPulse.com.au  Fact Sheet for Healthcare Providers:  IncredibleEmployment.be  This test is no t yet approved or cleared by the [place] FDA and  has been authorized for detection and/or diagnosis of SARS-CoV-2 by FDA under an Emergency Use Authorization (EUA). This EUA will remain  in effect (meaning this test can be used) for the duration of the COVID-19 declaration under Section 564(b)(1) of the Act, 21 U.S.C.section 360bbb-3(b)(1), unless the authorization is terminated  or revoked sooner.       Influenza A by PCR NEGATIVE NEGATIVE Final   Influenza B by PCR NEGATIVE NEGATIVE Final    Comment: (NOTE) The Xpert Xpress SARS-CoV-2/FLU/RSV plus assay is intended as an aid in the diagnosis of influenza from Nasopharyngeal swab specimens and should not be used as a sole basis for treatment.  Nasal washings and aspirates are unacceptable for Xpert Xpress SARS-CoV-2/FLU/RSV testing.  Fact Sheet for Patients: EntrepreneurPulse.com.au  Fact Sheet for Healthcare Providers: IncredibleEmployment.be  This test is not yet approved or cleared by the [place] FDA and has been  authorized for detection and/or diagnosis of SARS-CoV-2 by FDA under an Emergency Use Authorization (EUA). This EUA will remain in effect (meaning this test can be used) for the duration of the COVID-19 declaration under Section 564(b)(1) of the Act, 21 U.S.C. section 360bbb-3(b)(1), unless the authorization is terminated or revoked.  Performed at [facility], [place] [address] [place], [place] [address]   Respiratory (~20 pathogens) panel by PCR     Status: None   Collection Time: 05/06/21 10:50 PM   Specimen: Nasopharyngeal Swab; Respiratory  Result Value Ref Range Status   Adenovirus NOT DETECTED NOT DETECTED Final   Coronavirus 229E NOT DETECTED NOT DETECTED Final    Comment: (NOTE) The Coronavirus on the Respiratory Panel, DOES NOT test for the novel  Coronavirus (2019 nCoV)    Coronavirus HKU1 NOT DETECTED NOT DETECTED Final   Coronavirus NL63 NOT DETECTED NOT DETECTED Final   Coronavirus OC43 NOT DETECTED NOT DETECTED Final   Metapneumovirus NOT DETECTED NOT DETECTED Final   Rhinovirus / Enterovirus NOT DETECTED NOT DETECTED Final   Influenza A NOT DETECTED NOT DETECTED Final   Influenza B NOT DETECTED NOT DETECTED Final   Parainfluenza Virus 1 NOT DETECTED NOT DETECTED Final   Parainfluenza Virus 2 NOT DETECTED NOT DETECTED Final   Parainfluenza Virus 3 NOT DETECTED NOT DETECTED Final   Parainfluenza Virus 4 NOT DETECTED NOT DETECTED Final   Respiratory Syncytial Virus NOT DETECTED NOT DETECTED Final   Bordetella pertussis NOT DETECTED NOT DETECTED Final   Bordetella Parapertussis NOT DETECTED NOT DETECTED Final   Chlamydophila pneumoniae NOT DETECTED NOT DETECTED Final   Mycoplasma pneumoniae NOT DETECTED NOT DETECTED Final    Comment: Performed at [facility], [place]. [address], [place], [place] [address]     Labs: BNP (last 3 results) Recent Labs    12/12/20 2218 05/06/21 2030  BNP 142.2* 759.1*   Basic Metabolic Panel: Recent Labs  Lab  05/06/21 1843 05/06/21 1847 05/07/21 0355  NA 137 140 135  K 5.0 4.9 4.4  CL 104 105 102  CO2 21*  --  24  GLUCOSE 133* 133* 162*  BUN 16 18 16   CREATININE 1.33* 1.30* 1.41*  CALCIUM 9.8  --  9.5  MG  --   --  2.2   Liver Function Tests: Recent Labs  Lab 05/06/21 1843 05/07/21 0355  AST 32 26  ALT 21 17  ALKPHOS 44 45  BILITOT 0.7 0.4  PROT 8.3* 8.0  ALBUMIN 4.0 3.7   No results for input(s): LIPASE, AMYLASE in the last 168 hours. No results for input(s): AMMONIA in the last 168 hours. CBC: Recent Labs  Lab 05/06/21 1843 05/06/21 1847 05/07/21 0355  WBC 9.6  --  9.9  NEUTROABS 4.4  --  8.3*  HGB 13.5 15.0 13.2  HCT 43.3 44.0 41.4  MCV 86.1  --  84.3  PLT 321  --  302   Cardiac Enzymes: No results for input(s): CKTOTAL, CKMB, CKMBINDEX, TROPONINI in the last 168 hours. BNP: Invalid input(s): POCBNP CBG: Recent Labs  Lab 05/07/21 0806 05/07/21 1204  GLUCAP 188* 167*   D-Dimer No results for input(s): DDIMER in the last 72 hours. Hgb A1c Recent Labs    05/07/21  0355  HGBA1C 7.1*   Lipid Profile No results for input(s): CHOL, HDL, LDLCALC, TRIG, CHOLHDL, LDLDIRECT in the last 72 hours. Thyroid function studies No results for input(s): TSH, T4TOTAL, T3FREE, THYROIDAB in the last 72 hours.  Invalid input(s): FREET3 Anemia work up No results for input(s): VITAMINB12, FOLATE, FERRITIN, TIBC, IRON, RETICCTPCT in the last 72 hours. Urinalysis    Component Value Date/Time   COLORURINE YELLOW 10/15/2020 [facility] 10/15/2020 1321   LABSPEC 1.020 10/15/2020 1321   PHURINE 5.5 10/15/2020 1321   GLUCOSEU NEGATIVE 10/15/2020 1321   HGBUR NEGATIVE 10/15/2020 1321   BILIRUBINUR NEGATIVE 10/15/2020 1321   [place] (A) 10/15/2020 1321   PROTEINUR NEGATIVE 10/15/2020 1321   NITRITE NEGATIVE 10/15/2020 1321   LEUKOCYTESUR NEGATIVE 10/15/2020 1321   Sepsis Labs Invalid input(s): PROCALCITONIN,  WBC,  LACTICIDVEN Microbiology Recent  Results (from the past 240 hour(s))  Resp Panel by RT-PCR (Flu A&B, Covid) Nasopharyngeal Swab     Status: None   Collection Time: 05/06/21 10:50 PM   Specimen: Nasopharyngeal Swab; Nasopharyngeal(NP) swabs in vial transport medium  Result Value Ref Range Status   SARS Coronavirus 2 by RT PCR NEGATIVE NEGATIVE Final    Comment: (NOTE) SARS-CoV-2 target nucleic acids are NOT DETECTED.  The SARS-CoV-2 RNA is generally detectable in upper respiratory specimens during the acute phase of infection. The lowest concentration of SARS-CoV-2 viral copies this assay can detect is 138 copies/mL. A negative result does not preclude SARS-Cov-2 infection and should not be used as the sole basis for treatment or other patient management decisions. A negative result may occur with  improper specimen collection/handling, submission of specimen other than nasopharyngeal swab, presence of viral mutation(s) within the areas targeted by this assay, and inadequate number of viral copies(<138 copies/mL). A negative result must be combined with clinical observations, patient history, and epidemiological information. The expected result is Negative.  Fact Sheet for Patients:  EntrepreneurPulse.com.au  Fact Sheet for Healthcare Providers:  IncredibleEmployment.be  This test is no t yet approved or cleared by the [place] FDA and  has been authorized for detection and/or diagnosis of SARS-CoV-2 by FDA under an Emergency Use Authorization (EUA). This EUA will remain  in effect (meaning this test can be used) for the duration of the COVID-19 declaration under Section 564(b)(1) of the Act, 21 U.S.C.section 360bbb-3(b)(1), unless the authorization is terminated  or revoked sooner.       Influenza A by PCR NEGATIVE NEGATIVE Final   Influenza B by PCR NEGATIVE NEGATIVE Final    Comment: (NOTE) The Xpert Xpress SARS-CoV-2/FLU/RSV plus assay is intended as an aid in the  diagnosis of influenza from Nasopharyngeal swab specimens and should not be used as a sole basis for treatment. Nasal washings and aspirates are unacceptable for Xpert Xpress SARS-CoV-2/FLU/RSV testing.  Fact Sheet for Patients: EntrepreneurPulse.com.au  Fact Sheet for Healthcare Providers: IncredibleEmployment.be  This test is not yet approved or cleared by the [place] FDA and has been authorized for detection and/or diagnosis of SARS-CoV-2 by FDA under an Emergency Use Authorization (EUA). This EUA will remain in effect (meaning this test can be used) for the duration of the COVID-19 declaration under Section 564(b)(1) of the Act, 21 U.S.C. section 360bbb-3(b)(1), unless the authorization is terminated or revoked.  Performed at [facility], [place] [address] [place], [place] [address]   Respiratory (~20 pathogens) panel by PCR     Status: None   Collection Time: 05/06/21 10:50 PM  Specimen: Nasopharyngeal Swab; Respiratory  Result Value Ref Range Status   Adenovirus NOT DETECTED NOT DETECTED Final   Coronavirus 229E NOT DETECTED NOT DETECTED Final    Comment: (NOTE) The Coronavirus on the Respiratory Panel, DOES NOT test for the novel  Coronavirus (2019 nCoV)    Coronavirus HKU1 NOT DETECTED NOT DETECTED Final   Coronavirus NL63 NOT DETECTED NOT DETECTED Final   Coronavirus OC43 NOT DETECTED NOT DETECTED Final   Metapneumovirus NOT DETECTED NOT DETECTED Final   Rhinovirus / Enterovirus NOT DETECTED NOT DETECTED Final   Influenza A NOT DETECTED NOT DETECTED Final   Influenza B NOT DETECTED NOT DETECTED Final   Parainfluenza Virus 1 NOT DETECTED NOT DETECTED Final   Parainfluenza Virus 2 NOT DETECTED NOT DETECTED Final   Parainfluenza Virus 3 NOT DETECTED NOT DETECTED Final   Parainfluenza Virus 4 NOT DETECTED NOT DETECTED Final   Respiratory Syncytial Virus NOT DETECTED NOT DETECTED Final   Bordetella pertussis NOT DETECTED  NOT DETECTED Final   Bordetella Parapertussis NOT DETECTED NOT DETECTED Final   Chlamydophila pneumoniae NOT DETECTED NOT DETECTED Final   Mycoplasma pneumoniae NOT DETECTED NOT DETECTED Final    Comment: Performed at [facility], [place]. [address], [place], [place] [address]     Time coordinating discharge: Over 30 minutes  SIGNED:   Little Ishikawa, DO [facility] 05/07/2021, 5:54 PM Pager   If 7PM-7AM, please contact night-coverage [email or web address]

## 2021-05-07 NOTE — ED Notes (Signed)
Pt resting on stretcher with eyes closed, respirations even and unlabored. Pt given blanket and pillow per request. Ice water placed on bedside table. No acute changes noted. Will continue to monitor. Lights off, side rails up x2, call bell within reach, urinal at bedside.

## 2021-05-07 NOTE — Progress Notes (Deleted)
Primary Physician/Referring:  Susy Frizzle, MD  Patient ID: [name], male    DOB: Sep 12, 1940, 80 y.o.   MRN: [id number]  No chief complaint on file.   HPI:    Chris [name].  is a 80 y.o. Caucasian male patient with hypertension, hyperlipidemia, diabetes mellitus diagnosed in 2018, CAD,hx of MI with CABG S/P multiple coronary interventions,  H/O stroke in past without residual deficits, chronic systolic and diastolic CHF with EF 03-50% and asymptomatic carotid artery stenosis, IPF.  Patient underwent PCI to high-grade stenosis of the SVG to RCA in March 2022.  Patient presents for 3-week follow-up of orthostatic hypotension.  At last office visit discontinued metoprolol. Patient's symptoms of fatigue and dizziness have improved since last visit with discontinuation of metoprolol. He does continue to have occasional episodes of dizziness upon standing, but this is less frequent.  Notably patient had also inadvertently also stopped his Plavix. Denies chest pain, palpitations, syncope.  He continues to have dyspnea on exertion which is stable.  Past Medical History:  Diagnosis Date   Abnormal exercise myocardial perfusion study    03/2018- 38% ef, see report   Arthritis    Chronic kidney disease    Congestive heart failure (CHF) ([place]) 09/2015   EF 35-40% [name]   Coronary artery disease    a. 1995 s/p CABG x 3 (VG->RCA, LIMA->LAD, VG->OM);  b. 07/2008 Inf MI/Cath/PCI: VG->RCA 99 - treated with Taxus DES (78mm), LIMA and VG->OM patent, LAD 100, LCX 100, RCA 60d, EF 50%;  c. 09/2008 PCI native RCA  w/ 2.5x23 Xience DES, VG->RCA stent patent;  c. 05/2010 Cath: Native 3VD with 3/3 patent grafts, native RCA w 80% ISR prox to graft insertion-->Med Rx.   DOE (dyspnea on exertion)    ED (erectile dysfunction)    GERD (gastroesophageal reflux disease)    occ   Gout    H/O hiatal hernia    Hyperlipidemia    Hypertension    Myocardial infarction ([place])    Neuropathy     toes   Obesity    RBBB    RBBB (right bundle branch block)    Stenosis of right internal carotid artery    50-69% (2014)   Type II diabetes mellitus ([place])    Umbilical hernia    a. s/p repair.   Vertigo    Past Surgical History:  Procedure Laterality Date   ANKLE ARTHROSCOPY WITH DRILLING/MICROFRACTURE Left 04/13/2013   Procedure: LEFT ANKLE ARTHROSCOPY WITH EXTENSIVE DEBRIEDMENT;  Surgeon: Wylene Simmer, MD;  Location: [facility];  Service: Orthopedics;  Laterality: Left;   ANKLE SURGERY Left 08/18/2013   DR HEWITT   CARDIAC CATHETERIZATION  2000   stents x3   CARDIAC CATHETERIZATION  02/15/2014   Procedure: LEFT HEART CATH AND CORS/GRAFTS ANGIOGRAPHY;  Surgeon: Laverda Page, MD;  Location: [facility] CATH LAB;  Service: Cardiovascular;;   COLONOSCOPY     CORONARY ARTERY BYPASS GRAFT  1995   LIMA to LAD, SVG to RCA, SVG to OM.    CORONARY STENT INTERVENTION N/A 08/12/2020   Procedure: CORONARY STENT INTERVENTION;  Surgeon: Nigel Mormon, MD;  Location: [facility] CV LAB;  Service: Cardiovascular;  Laterality: N/A;   LEFT HEART CATH AND CORS/GRAFTS ANGIOGRAPHY N/A 08/12/2020   Procedure: LEFT HEART CATH AND CORS/GRAFTS ANGIOGRAPHY;  Surgeon: Nigel Mormon, MD;  Location: [facility] CV LAB;  Service: Cardiovascular;  Laterality: N/A;   TOTAL ANKLE ARTHROPLASTY Left 08/17/2013  Procedure: LEFT TOTAL ANKLE REPLACEMENT WITH POSSIBLE GASTROC RECESSION ;  Surgeon: Wylene Simmer, MD;  Location: [place];  Service: Orthopedics;  Laterality: Left;   UMBILICAL HERNIA REPAIR  12/2007   Family History  Problem Relation Age of Onset   Aneurysm Mother    Heart disease Father    Heart attack Father    Stroke Brother     Social History   Tobacco Use   Smoking status: Former    Packs/day: 1.00    Years: 9.00    Pack years: 9.00    Types: Cigarettes    Quit date: 10/31/1970    Years since quitting: 50.5   Smokeless tobacco: Former    Types: Chew   Tobacco comments:     chewed for 2 years in his 20's  Substance Use Topics   Alcohol use: Not Currently    Comment: occ wine last 6 months   ROS  Review of Systems  Constitutional: Negative for malaise/fatigue and weight gain.  Cardiovascular:  Positive for dyspnea on exertion (stable). Negative for chest pain, claudication, leg swelling, near-syncope, orthopnea, palpitations, paroxysmal nocturnal dyspnea and syncope.  Respiratory:  Negative for shortness of breath.   Musculoskeletal:  Positive for arthritis.  Gastrointestinal:  Negative for melena.  Neurological:  Positive for dizziness (improved).  Objective  There were no vitals taken for this visit.  Vitals with BMI 05/07/2021 05/07/2021 05/07/2021  Height - - -  Weight - - -  BMI - - -  Systolic 485 462 703  Diastolic 87 81 93  Pulse 88 91 93   No data found.   Physical Exam Vitals reviewed.  Constitutional:      General: He is not in acute distress.    Appearance: He is well-developed. He is obese.  Neck:     Vascular: No JVD.  Cardiovascular:     Rate and Rhythm: Normal rate and regular rhythm.     Pulses:          Carotid pulses are 2+ on the right side and 2+ on the left side.    Heart sounds: Normal heart sounds. No murmur heard.   No gallop.     Comments: No JVD, no leg edema.  Fem and Pop pulse difficult to feel due to body habitus.  DP  Normal and PT absent bilateral Pulmonary:     Effort: Pulmonary effort is normal.     Breath sounds: Rales (left worse than right coarse crackles) present.  Musculoskeletal:     Right lower leg: No edema.     Left lower leg: No edema.  Neurological:     Mental Status: He is alert.  Physical exam unchanged compared to previous.  Laboratory examination:   Recent Labs    05/15/20 1000 08/12/20 1253 08/30/20 1456 09/04/20 1603 12/12/20 2240 05/06/21 1843 05/06/21 1847 05/07/21 0355  NA 141   < > 139   < > 135 137 140 135  K 4.7   < > 4.6   < > 3.8 5.0 4.9 4.4  CL 104   < > 104   < >  109 104 105 102  CO2 21   < > 25   < > 18* 21*  --  24  GLUCOSE 134*   < > 92   < > 151* 133* 133* 162*  BUN 13   < > 16   < > 18 16 18 16   CREATININE 1.24   < > 1.23*   < >  1.80* 1.33* 1.30* 1.41*  CALCIUM 9.6   < > 9.8   < > 8.8* 9.8  --  9.5  GFRNONAA 56*   < > 56*   < > 38* 55*  --  51*  GFRAA 64  --  65  --   --   --   --   --    < > = values in this interval not displayed.    estimated creatinine clearance is 50.8 mL/min (A) (by C-G formula based on SCr of 1.41 mg/dL (H)).  CMP Latest Ref Rng & Units 05/07/2021 05/06/2021 05/06/2021  Glucose 70 - 99 mg/dL 162(H) 133(H) 133(H)  BUN 8 - 23 mg/dL 16 18 16   Creatinine 0.61 - 1.24 mg/dL 1.41(H) 1.30(H) 1.33(H)  Sodium 135 - 145 mmol/L 135 140 137  Potassium 3.5 - 5.1 mmol/L 4.4 4.9 5.0  Chloride 98 - 111 mmol/L 102 105 104  CO2 22 - 32 mmol/L 24 - 21(L)  Calcium 8.9 - 10.3 mg/dL 9.5 - 9.8  Total Protein 6.5 - 8.1 g/dL 8.0 - 8.3(H)  Total Bilirubin 0.3 - 1.2 mg/dL 0.4 - 0.7  Alkaline Phos 38 - 126 U/L 45 - 44  AST 15 - 41 U/L 26 - 32  ALT 0 - 44 U/L 17 - 21   CBC Latest Ref Rng & Units 05/07/2021 05/06/2021 05/06/2021  WBC 4.0 - 10.5 K/uL 9.9 - 9.6  Hemoglobin 13.0 - 17.0 g/dL 13.2 15.0 13.5  Hematocrit 39.0 - 52.0 % 41.4 44.0 43.3  Platelets 150 - 400 K/uL 302 - 321   Lipid Panel Recent Labs    05/15/20 1000 08/12/20 1253 08/13/20 0306  CHOL 183 126 110  TRIG 344* 220* 230*  LDLCALC 87 40 29  VLDL  --  44* 46*  HDL 40 42 35*  CHOLHDL  --  3.0 3.1     HEMOGLOBIN A1C Lab Results  Component Value Date   HGBA1C 7.1 (H) 05/07/2021   MPG 157.07 05/07/2021   TSH No results for input(s): TSH in the last 8760 hours.  Allergies   Allergies  Allergen Reactions   Brilinta [Ticagrelor] Shortness Of Breath   Alpha-Gal Hives   Crestor [Rosuvastatin Calcium] Other (See Comments)    Muscle pain- tolerating this 2022, however   Lipitor [Atorvastatin] Other (See Comments)    Intolerable muscle pain all over      Medications Prior to Visit:   Facility-Administered Medications Prior to Visit  Medication Dose Route Frequency Provider Last Rate Last Admin   albuterol (PROVENTIL) (2.5 MG/3ML) 0.083% nebulizer solution 2.5 mg  2.5 mg Nebulization Q2H PRN Kristopher Oppenheim, DO       aspirin EC tablet 81 mg  81 mg Oral Daily Kristopher Oppenheim, DO   81 mg at 05/07/21 0900   clopidogrel (PLAVIX) tablet 75 mg  75 mg Oral Daily Kristopher Oppenheim, DO   75 mg at 05/07/21 0900   enoxaparin (LOVENOX) injection 40 mg  40 mg Subcutaneous Q24H Kristopher Oppenheim, DO   40 mg at 05/07/21 1308   insulin aspart (novoLOG) injection 0-15 Units  0-15 Units Subcutaneous TID WC Kristopher Oppenheim, DO   3 Units at 05/07/21 1242   insulin aspart (novoLOG) injection 0-5 Units  0-5 Units Subcutaneous QHS Kristopher Oppenheim, DO       nitroGLYCERIN (NITROSTAT) SL tablet 0.4 mg  0.4 mg Sublingual Q5 min PRN Kristopher Oppenheim, DO   0.4 mg at 05/06/21 2106   predniSONE (DELTASONE) tablet 40 mg  40  mg Oral Q breakfast Kristopher Oppenheim, DO   40 mg at 05/07/21 1610   rosuvastatin (CRESTOR) tablet 40 mg  40 mg Oral Daily Kristopher Oppenheim, DO   40 mg at 05/07/21 0900   tamsulosin (FLOMAX) capsule 0.4 mg  0.4 mg Oral QHS PRN Kristopher Oppenheim, DO       Outpatient Medications Prior to Visit  Medication Sig Dispense Refill   acetaminophen (TYLENOL) 500 MG tablet Take 500 mg by mouth every 6 (six) hours as needed for mild pain.     aspirin EC 81 MG tablet Take 1 tablet (81 mg total) by mouth daily.     clopidogrel (PLAVIX) 75 MG tablet Take 1 tablet (75 mg total) by mouth daily. 10 tablet 1   diphenhydrAMINE HCl (BENADRYL ALLERGY PO) Take 1 tablet by mouth as needed (hives).     EPINEPHrine 0.3 mg/0.3 mL IJ SOAJ injection Inject 0.3 mg into the muscle as needed for anaphylaxis. 1 each 0   metFORMIN (GLUCOPHAGE-XR) 500 MG 24 hr tablet TAKE 2 TABLETS EVERY DAY WITH BREAKFAST (Patient taking differently: 1,000 mg daily with breakfast.) 180 tablet 3   nitroGLYCERIN (NITROSTAT) 0.4 MG SL tablet Place 0.4 mg under the  tongue every 5 (five) minutes as needed for chest pain.     rosuvastatin (CRESTOR) 40 MG tablet TAKE 1 TABLET EVERY DAY (Patient taking differently: Take 40 mg by mouth daily.) 90 tablet 0   Tamsulosin HCl (FLOMAX PO) Take 1 capsule by mouth at bedtime as needed (bladder).     triamcinolone (KENALOG) 0.025 % ointment Apply 1 application topically 2 (two) times daily as needed (itching).     Final Medications at End of Visit    No outpatient medications have been marked as taking for the 05/08/21 encounter (Appointment) with Rayetta Pigg, Dayquan Buys C, PA-C.    Radiology:   CT angio chest 07/25/2019: 1. No evidence of pulmonary embolism. 2. Scattered ground-glass density throughout both lungs with increased central and subpleural reticulation and areas of mosaic attenuation. No frank honeycombing or significant bronchiectasis. Differential considerations include chronic hypersensitivity pneumonitis or idiopathic interstitial pneumonia such as NSIP. Pulmonary consultation is suggested, as well as high-resolution chest CT follow-up in 6 months. 3. 5 mm pulmonary nodule along the right major fissure. No follow-up needed if patient is low-risk. Non-contrast chest CT can be considered in 12 months if patient is high-risk.  4.  Aortic atherosclerosis (ICD10-I70.0).  CT Chest 12/04/2020:  1. 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 Findings are consistent with UIP per consensus guidelines: Diagnosis of Idiopathic Pulmonary Fibrosis: An Official ATS/ERS/JRS/ALAT Clinical Practice Guideline. [place], Iss 5, [phone], Jan 23 2017. 2. Stable borderline mild cardiomegaly. 3. Large hiatal hernia. 4. Aortic Atherosclerosis (ICD10-I70.0).  Cardiac Studies:   Lexiscan myoview stress test 04/11/2018: 1. Lexiscan stress test was performed. Exercise capacity was not assessed. No stress symptoms reported. Blood  pressure was normal.  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  Stress EKG is non diagnostic for ischemia as it is a pharmacologic stress. 2. The overall quality of the study is good.  Left ventricular cavity is noted to be normal on the rest and stress studies.  Gated SPECT imaging demonstrates hypokinesis of the basal inferior, basal inferolateral, mid inferior, mid inferolateral, apical inferior and apical lateral myocardial wall(s).  The left ventricular ejection fraction  was calculated or visually estimated to be 38%. LSPECT images reveal a large sized, medium intensity, minimally reversible perfusion defect suggestive of large infarct with minimal peri infarct ischemia in LCx/PDA territory.   3. High risk study.  Overnight oximetry test 10/11/2019  Desaturation below 88% 17.6 minutes.  Left heart catheterization 08/12/2020: LM: Normal LAD: 100% occlusion after D1 LCx: 100% prox occlusion RCA: 100% mid occlusion with ISR LIMA-LAD: Patent SVG-OM3: Patent SVG-RCA: Patent prox-mid stent with 40% lumen loss ( 09/2008 PCI with 38 mm Taxus DES)                90% and 80% lesions in distal part of SVG-RCA                90% lesion at SVG-to RCA anastomosis   LVEF 40-45%. LVEDP 18 mmHg   Successful stenting of SVG to RCA, distal segment with 3.5 x 38 mm and anastamosis with 2.5 x 12 mm resolute Onyx DES.  Echocardiogram 11/06/2020:   Left ventricle cavity is normal in size. Moderate concentric hypertrophy of the left ventricle.  Left ventricle regional wall motion findings: Basal inferolateral, Basal inferior, Mid inferolateral and Mid inferior akinesis. Moderate decrease in global wall motion. Visual EF is 35-40%. Doppler evidence of grade II (pseudonormal) diastolic dysfunction, elevated LAP. Left atrial cavity is moderately dilated by volume. Right  ventricle cavity is mildly dilated. Normal right ventricular function. Compared to 08/13/2020, no significant change.  Carotid artery duplex 11/06/2020: Doppler velocity suggests stenosis in the right internal carotid artery (1-15%). Doppler velocity suggests stenosis in the left internal carotid artery (16-49%). Antegrade right vertebral artery flow. Antegrade left vertebral artery flow. Compared to 04/24/2020, right ICA stenosis was 50-69% stenosis. Follow up in one year is appropriate if clinically indicated.   EKG   11/13/2020: Normal sinus rhythm at rate of 64 beats minute, left atrial enlargement, right axis deviation, cannot exclude inferior infarct old.  Right bundle branch block.  No significant change from 08/12/2020. [place] pulmonary disease pattern.    Assessment   No diagnosis found.   No orders of the defined types were placed in this encounter.   There are no discontinued medications.    No orders of the defined types were placed in this encounter.     Recommendations:   Alric Geise.  is a 80 y.o. Caucasian male patient with hypertension, hyperlipidemia, diabetes mellitus diagnosed in 2018, CAD, hx of MI with CABG S/P multiple coronary interventions,  H/O stroke in past without residual deficits, chronic systolic and diastolic CHF with EF 19-50% and asymptomatic carotid artery stenosis.  He had presented with unstable anginal-like symptoms to our office in March 2022, underwent successful angioplasty to saphenous vein graft to the right coronary artery.  Patient presents for 3-week follow-up of orthostatic hypotension.  At last office visit discontinued metoprolol.  Patient remains orthostatic in the office today, however symptoms have improved and are now less frequent with discontinuation of metoprolol.  Patient would benefit from initiation of valsartan or Entresto as well as Imdur and a rechallenge of beta-blocker therapy, however given that he remains  orthostatic will hold off at this time.  Advised patient regarding conservative measures including hydration and changing positions slowly.  Patient verbalized understanding agreement.  Patient had inadvertently discontinued Plavix, will resume at this time given successful stenting of SVG to RCA on 08/12/2020, recommend continuing dual antiplatelet therapy until 07/2021 if tolerated.  Patient is requesting A1c check for his PCPs office, we  will therefore order this.  Reviewed and discussed with patient carotid artery duplex results, details above.  We will repeat carotid artery surveillance in 1 year.  Follow-up in 3 months, sooner if needed.   Alethia Berthold, PA-C 05/07/2021, 1:12 PM Office: [phone]

## 2021-05-08 ENCOUNTER — Ambulatory Visit: Payer: Medicare HMO | Admitting: Student

## 2021-05-09 ENCOUNTER — Telehealth: Payer: Self-pay

## 2021-05-09 NOTE — Telephone Encounter (Signed)
Transition Care Management Follow-up Telephone Call Date of discharge and from where: 05/07/21 [facility] Diagnosis: CHF How have you been since you were released from the hospital? Spoke with patient's wife, Katharine Look, and she states patient is doing much better.  Any questions or concerns? No  Items Reviewed: Did the pt receive and understand the discharge instructions provided? Yes  Medications obtained and verified? Yes  Other? No  Any new allergies since your discharge? No  Dietary orders reviewed? Y Do you have support at home? Yes   Home Care and Equipment/Supplies: Were home health services ordered? no If so, what is the name of the agency? N/A  Has the agency set up a time to come to the patient's home? not applicable Were any new equipment or medical supplies ordered?  No What is the name of the medical supply agency? N/A Were you able to get the supplies/equipment? not applicable Do you have any questions related to the use of the equipment or supplies? No  Functional Questionnaire: (I = Independent and D = Dependent) ADLs: I  Bathing/Dressing- I  Meal Prep- I  Eating- I  Maintaining continence- I  Transferring/Ambulation- I  Managing Meds- I  Follow up appointments reviewed:  PCP Hospital f/u appt confirmed? Yes  Scheduled to see Dr. Dennard Schaumann on 05/15/21 @ 12. [facility] f/u appt confirmed?  N/A   Are transportation arrangements needed? No  If their condition worsens, is the pt aware to call PCP or go to the Emergency Dept.? Yes Was the patient provided with contact information for the PCP's office or ED? Yes Was to pt encouraged to call back with questions or concerns? Yes

## 2021-05-13 ENCOUNTER — Other Ambulatory Visit: Payer: Self-pay

## 2021-05-13 ENCOUNTER — Encounter (HOSPITAL_COMMUNITY): Payer: Self-pay | Admitting: Emergency Medicine

## 2021-05-13 ENCOUNTER — Emergency Department (HOSPITAL_COMMUNITY)
Admission: EM | Admit: 2021-05-13 | Discharge: 2021-05-13 | Disposition: A | Discharge status: Home / Self Care | Payer: Medicare HMO | Attending: Emergency Medicine | Admitting: Emergency Medicine

## 2021-05-13 ENCOUNTER — Emergency Department (HOSPITAL_COMMUNITY): Payer: Medicare HMO

## 2021-05-13 DIAGNOSIS — Z7902 Long term (current) use of antithrombotics/antiplatelets: Secondary | ICD-10-CM | POA: Insufficient documentation

## 2021-05-13 DIAGNOSIS — E1140 Type 2 diabetes mellitus with diabetic neuropathy, unspecified: Secondary | ICD-10-CM | POA: Insufficient documentation

## 2021-05-13 DIAGNOSIS — E1122 Type 2 diabetes mellitus with diabetic chronic kidney disease: Secondary | ICD-10-CM | POA: Insufficient documentation

## 2021-05-13 DIAGNOSIS — Z96662 Presence of left artificial ankle joint: Secondary | ICD-10-CM | POA: Insufficient documentation

## 2021-05-13 DIAGNOSIS — Z87891 Personal history of nicotine dependence: Secondary | ICD-10-CM | POA: Insufficient documentation

## 2021-05-13 DIAGNOSIS — Z955 Presence of coronary angioplasty implant and graft: Secondary | ICD-10-CM | POA: Insufficient documentation

## 2021-05-13 DIAGNOSIS — N189 Chronic kidney disease, unspecified: Secondary | ICD-10-CM | POA: Insufficient documentation

## 2021-05-13 DIAGNOSIS — Z951 Presence of aortocoronary bypass graft: Secondary | ICD-10-CM | POA: Diagnosis not present

## 2021-05-13 DIAGNOSIS — I5042 Chronic combined systolic (congestive) and diastolic (congestive) heart failure: Secondary | ICD-10-CM | POA: Diagnosis not present

## 2021-05-13 DIAGNOSIS — K449 Diaphragmatic hernia without obstruction or gangrene: Secondary | ICD-10-CM | POA: Diagnosis not present

## 2021-05-13 DIAGNOSIS — I2510 Atherosclerotic heart disease of native coronary artery without angina pectoris: Secondary | ICD-10-CM | POA: Diagnosis not present

## 2021-05-13 DIAGNOSIS — R079 Chest pain, unspecified: Secondary | ICD-10-CM | POA: Diagnosis not present

## 2021-05-13 DIAGNOSIS — Z7982 Long term (current) use of aspirin: Secondary | ICD-10-CM | POA: Diagnosis not present

## 2021-05-13 DIAGNOSIS — I130 Hypertensive heart and chronic kidney disease with heart failure and stage 1 through stage 4 chronic kidney disease, or unspecified chronic kidney disease: Secondary | ICD-10-CM | POA: Insufficient documentation

## 2021-05-13 DIAGNOSIS — K440 Diaphragmatic hernia with obstruction, without gangrene: Secondary | ICD-10-CM | POA: Insufficient documentation

## 2021-05-13 DIAGNOSIS — R1013 Epigastric pain: Secondary | ICD-10-CM

## 2021-05-13 DIAGNOSIS — R109 Unspecified abdominal pain: Secondary | ICD-10-CM | POA: Diagnosis not present

## 2021-05-13 DIAGNOSIS — Z7984 Long term (current) use of oral hypoglycemic drugs: Secondary | ICD-10-CM | POA: Insufficient documentation

## 2021-05-13 DIAGNOSIS — R0789 Other chest pain: Secondary | ICD-10-CM | POA: Diagnosis not present

## 2021-05-13 DIAGNOSIS — R0689 Other abnormalities of breathing: Secondary | ICD-10-CM | POA: Diagnosis not present

## 2021-05-13 DIAGNOSIS — R000 Tachycardia, unspecified: Secondary | ICD-10-CM | POA: Diagnosis not present

## 2021-05-13 LAB — CBC
HCT: 40.3 % (ref 39.0–52.0)
Hemoglobin: 12.1 g/dL — ABNORMAL LOW (ref 13.0–17.0)
MCH: 26.4 pg (ref 26.0–34.0)
MCHC: 30 g/dL (ref 30.0–36.0)
MCV: 87.8 fL (ref 80.0–100.0)
Platelets: 271 10*3/uL (ref 150–400)
RBC: 4.59 MIL/uL (ref 4.22–5.81)
RDW: 15.4 % (ref 11.5–15.5)
WBC: 8.1 10*3/uL (ref 4.0–10.5)
nRBC: 0 % (ref 0.0–0.2)

## 2021-05-13 LAB — COMPREHENSIVE METABOLIC PANEL
ALT: 17 U/L (ref 0–44)
AST: 25 U/L (ref 15–41)
Albumin: 3.6 g/dL (ref 3.5–5.0)
Alkaline Phosphatase: 41 U/L (ref 38–126)
Anion gap: 9 (ref 5–15)
BUN: 15 mg/dL (ref 8–23)
CO2: 23 mmol/L (ref 22–32)
Calcium: 9 mg/dL (ref 8.9–10.3)
Chloride: 105 mmol/L (ref 98–111)
Creatinine, Ser: 1.32 mg/dL — ABNORMAL HIGH (ref 0.61–1.24)
GFR, Estimated: 55 mL/min — ABNORMAL LOW (ref >60)
Glucose, Bld: 108 mg/dL — ABNORMAL HIGH (ref 70–99)
Potassium: 4.2 mmol/L (ref 3.5–5.1)
Sodium: 137 mmol/L (ref 135–145)
Total Bilirubin: 0.8 mg/dL (ref 0.3–1.2)
Total Protein: 7.6 g/dL (ref 6.5–8.1)

## 2021-05-13 LAB — TROPONIN I (HIGH SENSITIVITY)
Troponin I (High Sensitivity): 10 ng/L (ref <18)
Troponin I (High Sensitivity): 10 ng/L (ref <18)

## 2021-05-13 LAB — LIPASE, BLOOD: Lipase: 64 U/L — ABNORMAL HIGH (ref 11–51)

## 2021-05-13 MED ORDER — PANTOPRAZOLE SODIUM 40 MG PO TBEC: 40.0000 mg | DELAYED_RELEASE_TABLET | Freq: Every day | ORAL | 0 refills | Status: DC

## 2021-05-13 MED ORDER — FAMOTIDINE 20 MG PO TABS: 20.0000 mg | ORAL_TABLET | Freq: Two times a day (BID) | ORAL | 0 refills | Status: DC

## 2021-05-13 MED ORDER — MYLANTA MAXIMUM STRENGTH 400-400-40 MG/5ML PO SUSP: 10.0000 mL | Freq: Four times a day (QID) | ORAL | 0 refills | Status: DC | PRN

## 2021-05-13 MED ORDER — PANTOPRAZOLE SODIUM 40 MG IV SOLR
40.0000 mg | Freq: Once | INTRAVENOUS | Status: AC
  Administered 2021-05-13: 16:00:00 40 mg via INTRAVENOUS
  Filled 2021-05-13: qty 40

## 2021-05-13 MED ORDER — ALUM & MAG HYDROXIDE-SIMETH 200-200-20 MG/5ML PO SUSP
30.0000 mL | Freq: Once | ORAL | Status: AC
  Administered 2021-05-13: 16:00:00 30 mL via ORAL
  Filled 2021-05-13: qty 30

## 2021-05-13 MED ORDER — LIDOCAINE VISCOUS HCL 2 % MT SOLN
15.0000 mL | Freq: Once | OROMUCOSAL | Status: AC
  Administered 2021-05-13: 16:00:00 15 mL via ORAL
  Filled 2021-05-13: qty 15

## 2021-05-13 NOTE — ED Triage Notes (Signed)
Pt BIB [facility] from home. Complaint of abdominal pain and chest pain following eating lunch today. Pt diagnosed with hiatal hernia yesterday. Pt received 200 mcg fentanyl, 4 mg zofran, 300 NS via EMS.

## 2021-05-13 NOTE — Discharge Instructions (Addendum)
Please follow up with your PCP for further evaluation of your pain  Pick up medications and take as prescribed

## 2021-05-13 NOTE — ED Provider Notes (Signed)
[facility] EMERGENCY DEPARTMENT Provider Note   CSN: [id number] Arrival date & time: 05/13/21  1524     History Chief Complaint  Patient presents with   Abdominal Pain   Chest Pain    Chris [name]. is a 80 y.o. male with PMHx HTN, HLD, CAD s/p MI, CHF, CKD, Diabetes who presents to the ED today via EMS with complaint of gradual onset, constant, sharp/burning, epigastric abdominal pain radiating into left chest that began 15 minutes after eating a peanut butter and banana sandwich.  Per EMS patient was recently seen and diagnosed with a hiatal hernia.  He also has a history of MI however told EMS that this felt different.  He has had nausea and dry heaving.  No diarrhea.  His last normal bowel movement was yesterday.  He denies any shortness of breath or diaphoresis.  He was provided with 200 mcg of fentanyl in route as well as 4 mg of Zofran with only mild improvement in symptoms.   Per chart review: ED visit on 12/13-12/14 for epigastric pain s/p cheese sticks however found to be fluid overloaded at that time and admitted to medicine. Pt did have a CTA done without findings of PE and large hiatal hernia present.   The history is provided by the patient, medical records and the EMS personnel.      Past Medical History:  Diagnosis Date   Abnormal exercise myocardial perfusion study    03/2018- 38% ef, see report   Arthritis    Chronic kidney disease    Congestive heart failure (CHF) ([place]) 09/2015   EF 35-40% [name]   Coronary artery disease    a. 1995 s/p CABG x 3 (VG->RCA, LIMA->LAD, VG->OM);  b. 07/2008 Inf MI/Cath/PCI: VG->RCA 99 - treated with Taxus DES (19mm), LIMA and VG->OM patent, LAD 100, LCX 100, RCA 60d, EF 50%;  c. 09/2008 PCI native RCA  w/ 2.5x23 Xience DES, VG->RCA stent patent;  c. 05/2010 Cath: Native 3VD with 3/3 patent grafts, native RCA w 80% ISR prox to graft insertion-->Med Rx.   DOE (dyspnea on exertion)    ED (erectile  dysfunction)    GERD (gastroesophageal reflux disease)    occ   Gout    H/O hiatal hernia    Hyperlipidemia    Hypertension    Myocardial infarction ([place])    Neuropathy    toes   Obesity    RBBB    RBBB (right bundle branch block)    Stenosis of right internal carotid artery    50-69% (2014)   Type II diabetes mellitus ([place])    Umbilical hernia    a. s/p repair.   Vertigo     Patient Active Problem List   Diagnosis Date Noted   Acute exacerbation of idiopathic pulmonary fibrosis ([place]) 05/06/2021   Allergic reaction to alpha-gal 05/06/2021   Unstable angina ([place]) 09/04/2020   Chronic combined systolic and diastolic CHF (congestive heart failure) ([place])    Coronary artery disease involving native coronary artery of native heart with unstable angina pectoris ([place]) 08/12/2020   OSA (obstructive sleep apnea) 03/25/2020   Nocturnal hypoxia 01/17/2020   DOE (dyspnea on exertion) 01/01/2020   Hiatal hernia 01/01/2020   Pulmonary fibrosis ([place]) 01/01/2020   Abnormal exercise myocardial perfusion study    Congestive heart failure (CHF) ([place])    NSTEMI (non-ST elevated myocardial infarction) ([place]) 02/15/2014   Type II diabetes mellitus (H[facility])    Arthritis of left ankle  08/17/2013   Stenosis of right internal carotid artery    Precordial pain 06/03/2012   TIA (transient ischemic attack) 10/02/2011   Peripheral neuropathy 04/14/2011   CAD (coronary artery disease) 11/04/2010   Hypertension 11/04/2010   Renal insufficiency 11/04/2010   Obese 11/04/2010   CVA (cerebral vascular accident) ([place]) 11/04/2010   Hyperlipemia 11/04/2010    Past Surgical History:  Procedure Laterality Date   ANKLE ARTHROSCOPY WITH DRILLING/MICROFRACTURE Left 04/13/2013   Procedure: LEFT ANKLE ARTHROSCOPY WITH EXTENSIVE DEBRIEDMENT;  Surgeon: Wylene Simmer, MD;  Location: [facility];  Service: Orthopedics;  Laterality: Left;   ANKLE SURGERY Left 08/18/2013   DR HEWITT   CARDIAC  CATHETERIZATION  2000   stents x3   CARDIAC CATHETERIZATION  02/15/2014   Procedure: LEFT HEART CATH AND CORS/GRAFTS ANGIOGRAPHY;  Surgeon: Laverda Page, MD;  Location: [facility] CATH LAB;  Service: Cardiovascular;;   COLONOSCOPY     CORONARY ARTERY BYPASS GRAFT  1995   LIMA to LAD, SVG to RCA, SVG to OM.    CORONARY STENT INTERVENTION N/A 08/12/2020   Procedure: CORONARY STENT INTERVENTION;  Surgeon: Nigel Mormon, MD;  Location: [facility] CV LAB;  Service: Cardiovascular;  Laterality: N/A;   LEFT HEART CATH AND CORS/GRAFTS ANGIOGRAPHY N/A 08/12/2020   Procedure: LEFT HEART CATH AND CORS/GRAFTS ANGIOGRAPHY;  Surgeon: Nigel Mormon, MD;  Location: [facility] CV LAB;  Service: Cardiovascular;  Laterality: N/A;   TOTAL ANKLE ARTHROPLASTY Left 08/17/2013   Procedure: LEFT TOTAL ANKLE REPLACEMENT WITH POSSIBLE GASTROC RECESSION ;  Surgeon: Wylene Simmer, MD;  Location: [facility];  Service: Orthopedics;  Laterality: Left;   UMBILICAL HERNIA REPAIR  12/2007       Family History  Problem Relation Age of Onset   Aneurysm Mother    Heart disease Father    Heart attack Father    Stroke Brother     Social History   Tobacco Use   Smoking status: Former    Packs/day: 1.00    Years: 9.00    Pack years: 9.00    Types: Cigarettes    Quit date: 10/31/1970    Years since quitting: 50.5   Smokeless tobacco: Former    Types: Chew   Tobacco comments:    chewed for 2 years in his 20's  Vaping Use   Vaping Use: Never used  Substance Use Topics   Alcohol use: Not Currently    Comment: occ wine last 6 months   Drug use: No    Home Medications Prior to Admission medications   Medication Sig Start Date End Date Taking? Authorizing Provider  pantoprazole (PROTONIX) 40 MG tablet Take 1 tablet (40 mg total) by mouth daily. 05/13/21 06/12/21 Yes Charlyne Robertshaw, PA-C  acetaminophen (TYLENOL) 500 MG tablet Take 500 mg by mouth every 6 (six) hours as needed for mild pain.    [provider]  aspirin EC 81 MG tablet Take 1 tablet (81 mg total) by mouth daily. 02/16/14   Adrian Prows, MD  clopidogrel (PLAVIX) 75 MG tablet Take 1 tablet (75 mg total) by mouth daily. 02/14/21 02/09/22  [name], [name], PA-C  diphenhydrAMINE HCl (BENADRYL ALLERGY PO) Take 1 tablet by mouth as needed (hives).    [provider]  EPINEPHrine 0.3 mg/0.3 mL IJ SOAJ injection Inject 0.3 mg into the muscle as needed for anaphylaxis. 12/12/20   Ripley Fraise, MD  metFORMIN (GLUCOPHAGE-XR) 500 MG 24 hr tablet TAKE 2 TABLETS EVERY DAY WITH BREAKFAST Patient taking  differently: 1,000 mg daily with breakfast. 04/04/21   Susy Frizzle, MD  nitroGLYCERIN (NITROSTAT) 0.4 MG SL tablet Place 0.4 mg under the tongue every 5 (five) minutes as needed for chest pain.    [provider]  rosuvastatin (CRESTOR) 40 MG tablet TAKE 1 TABLET EVERY DAY Patient taking differently: Take 40 mg by mouth daily. 12/04/20   Adrian Prows, MD  Tamsulosin HCl (FLOMAX PO) Take 1 capsule by mouth at bedtime as needed (bladder).    [provider]  triamcinolone (KENALOG) 0.025 % ointment Apply 1 application topically 2 (two) times daily as needed (itching).    [provider]    Allergies    Brilinta [ticagrelor], Alpha-gal, Crestor [rosuvastatin calcium], and Lipitor [atorvastatin]  Review of Systems   Review of Systems  Respiratory:  Negative for shortness of breath.   All other systems reviewed and are negative.  Physical Exam Updated Vital Signs BP 121/77    Pulse 60    Temp 97.6 F (36.4 C) (Oral)    Resp 15    SpO2 94%   Physical Exam Vitals and nursing note reviewed.  Constitutional:      Appearance: He is obese. He is not ill-appearing or diaphoretic.     Comments: Dry heaving  HENT:     Head: Normocephalic and atraumatic.  Eyes:     Conjunctiva/sclera: Conjunctivae normal.  Cardiovascular:     Rate and Rhythm: Regular rhythm.     Heart sounds: Normal heart sounds.   Pulmonary:     Effort: Pulmonary effort is normal.     Breath sounds: Normal breath sounds. No wheezing, rhonchi or rales.  Chest:     Chest wall: No tenderness.  Abdominal:     General: Abdomen is protuberant.     Palpations: Abdomen is soft.     Tenderness: There is abdominal tenderness in the epigastric area.  Musculoskeletal:     Cervical back: Neck supple.  Skin:    General: Skin is warm and dry.  Neurological:     Mental Status: He is alert.    ED Results / Procedures / Treatments   Labs (all labs ordered are listed, but only abnormal results are displayed) Labs Reviewed  CBC - Abnormal; Notable for the following components:      Result Value   Hemoglobin 12.1 (*)    All other components within normal limits  COMPREHENSIVE METABOLIC PANEL - Abnormal; Notable for the following components:   Glucose, Bld 108 (*)    Creatinine, Ser 1.32 (*)    GFR, Estimated 55 (*)    All other components within normal limits  LIPASE, BLOOD - Abnormal; Notable for the following components:   Lipase 64 (*)    All other components within normal limits  TROPONIN I (HIGH SENSITIVITY)  TROPONIN I (HIGH SENSITIVITY)    EKG EKG Interpretation  Date/Time:  Tuesday May 13 2021 15:36:59 EST Ventricular Rate:  78 PR Interval:  177 QRS Duration: 144 QT Interval:  442 QTC Calculation: 504 R Axis:   138 Text Interpretation: Sinus rhythm Ventricular premature complex Nonspecific intraventricular conduction delay Probable anterolateral infarct, old No significant change since last tracing Abnormal ECG Confirmed by Carmin Muskrat [phone]) on 05/13/2021 3:54:28 PM  Radiology DG Chest Portable 1 View  Result Date: 05/13/2021 CLINICAL DATA:  Abdominal pain and chest pain EXAM: PORTABLE CHEST 1 VIEW COMPARISON:  05/06/2021 FINDINGS: Midline sternotomy wires overlies stable enlarged cardiac silhouette. Low lung volumes. There is patchy lower lobe airspace  disease which is improved compared to  comparison exam 1 week prior. Upper lungs are clear. No focal consolidation IMPRESSION: 1. Improvement in bibasilar opacities suggest improving pulmonary edema pattern. 2. Low lung volumes. 3. No focal consolidation. Electronically Signed   By: Suzy Bouchard M.D.   On: 05/13/2021 16:09    Procedures Procedures   Medications Ordered in ED Medications  pantoprazole (PROTONIX) injection 40 mg (40 mg Intravenous Given 05/13/21 1613)  alum & mag hydroxide-simeth (MAALOX/MYLANTA) 200-200-20 MG/5ML suspension 30 mL (30 mLs Oral Given 05/13/21 1613)    And  lidocaine (XYLOCAINE) 2 % viscous mouth solution 15 mL (15 mLs Oral Given 05/13/21 1613)    ED Course  I have reviewed the triage vital signs and the nursing notes.  Pertinent labs & imaging results that were available during my care of the patient were reviewed by me and considered in my medical decision making (see chart for details).    MDM Rules/Calculators/A&P                          80 year old male who presents to the ED today with complaint of epigastric pain radiating to chest after eating a banana and peanut butter sandwich earlier today. Seen earlier this week and diagnosed with hiatal hernia. On arrival to the ED pt's VSS however appears uncomfortable and dry heaving. Received fentanyl and zofran en route. On exam he is noted to have epigastric TTP. Will plan for abdominal labs as well as EKG, trop, and CXR given hx of CAD however do suspect his symptoms are related to his hiatel hernia. Will provide protonix and GI cocktail.   EKG unchanged from previous CXR clear  On reevaluation prior to labs returning pt reports significant improvement in symptoms after medications. Will plan to dispo accordingly after labs. Given chest pain started today will need repeat trop testing  CBC without leukocytosis Hgb stable at 12.1 CMP with creatinine 1.32 (baseline) for patient. Glucose 108. No other electrolyte abnormalities. LFTs  unremarkable.  Lipase 64 Troponin of 10  At shift change case signed out to Dr. Matilde Sprang who will dispo accordingly after repeat troponin.   This note was prepared using Dragon voice recognition software and may include unintentional dictation errors due to the inherent limitations of voice recognition software.       Final Clinical Impression(s) / ED Diagnoses Final diagnoses:  Epigastric pain  Hiatal hernia    Rx / DC Orders ED Discharge Orders          Ordered    pantoprazole (PROTONIX) 40 MG tablet  Daily        05/13/21 [place], Romell Wolden, PA-C 05/13/21 1854    Teressa Lower, MD 05/13/21 [phone]

## 2021-05-22 ENCOUNTER — Other Ambulatory Visit: Payer: Self-pay

## 2021-05-22 ENCOUNTER — Encounter: Payer: Self-pay | Admitting: Student

## 2021-05-22 ENCOUNTER — Ambulatory Visit: Payer: Medicare HMO | Admitting: Student

## 2021-05-22 ENCOUNTER — Encounter: Payer: Self-pay | Admitting: Family Medicine

## 2021-05-22 ENCOUNTER — Ambulatory Visit (INDEPENDENT_AMBULATORY_CARE_PROVIDER_SITE_OTHER): Payer: Medicare HMO | Admitting: Family Medicine

## 2021-05-22 VITALS — BP 132/82 | HR 65 | Temp 97.2°F | Resp 18 | Ht 68.0 in | Wt 230.0 lb

## 2021-05-22 VITALS — BP 142/79 | HR 65 | Temp 98.2°F | Ht 68.0 in | Wt 233.0 lb

## 2021-05-22 DIAGNOSIS — K449 Diaphragmatic hernia without obstruction or gangrene: Secondary | ICD-10-CM

## 2021-05-22 DIAGNOSIS — K224 Dyskinesia of esophagus: Secondary | ICD-10-CM

## 2021-05-22 DIAGNOSIS — I6523 Occlusion and stenosis of bilateral carotid arteries: Secondary | ICD-10-CM

## 2021-05-22 DIAGNOSIS — I5042 Chronic combined systolic (congestive) and diastolic (congestive) heart failure: Secondary | ICD-10-CM

## 2021-05-22 DIAGNOSIS — I25118 Atherosclerotic heart disease of native coronary artery with other forms of angina pectoris: Secondary | ICD-10-CM | POA: Diagnosis not present

## 2021-05-22 MED ORDER — FAMOTIDINE 20 MG PO TABS: 20.0000 mg | ORAL_TABLET | Freq: Two times a day (BID) | ORAL | 1 refills | Status: DC

## 2021-05-22 MED ORDER — PANTOPRAZOLE SODIUM 40 MG PO TBEC: 40.0000 mg | DELAYED_RELEASE_TABLET | Freq: Every day | ORAL | 3 refills | Status: DC

## 2021-05-22 MED ORDER — VALSARTAN 40 MG PO TABS: 40.0000 mg | ORAL_TABLET | Freq: Every day | ORAL | 3 refills | Status: DC

## 2021-05-22 NOTE — Progress Notes (Signed)
Primary Physician/Referring:  Susy Frizzle, MD  Patient ID: Chris [name]., male    DOB: 01/05/1941, 80 y.o.   MRN: [id number]  Chief Complaint  Patient presents with   Hypertension   Hyperlipidemia   Coronary Artery Disease   HFrEF   Follow-up    3 month    HPI:    Chris [name].  is a 80 y.o. 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  Patient underwent PCI to high-grade stenosis of the SVG to RCA in March 2022.  Uptitration of guideline directed medical therapy for heart failure has been limited due to orthostatic hypotension and patient's symptoms of dizziness.  Patient has been resistant to additional medications as well as he feels well overall.  Patient was admitted 05/06/2021 - 05/07/2021 with epigastric pain at which time CT scan revealed large hiatal hernia which is likely etiology of patient's symptoms, at that time troponin and BNP were normal.  Patient has since followed up with PCP who has referred him for GI and surgical evaluation regarding management of hiatal hernia.  Patient is otherwise feeling well without recurrence of dizziness.  Denies chest pain, palpitations, syncope.  He does continue to have dyspnea on exertion which is stable.  Past Medical History:  Diagnosis Date   Abnormal exercise myocardial perfusion study    03/2018- 38% ef, see report   Arthritis    Chronic kidney disease    Congestive heart failure (CHF) ([place]) 09/2015   EF 35-40% [name]   Coronary artery disease    a. 1995 s/p CABG x 3 (VG->RCA, LIMA->LAD, VG->OM);  b. 07/2008 Inf MI/Cath/PCI: VG->RCA 99 - treated with Taxus DES (68mm), LIMA and VG->OM patent, LAD 100, LCX 100, RCA 60d, EF 50%;  c. 09/2008 PCI native RCA  w/  2.5x23 Xience DES, VG->RCA stent patent;  c. 05/2010 Cath: Native 3VD with 3/3 patent grafts, native RCA w 80% ISR prox to graft insertion-->Med Rx.   DOE (dyspnea on exertion)    ED (erectile dysfunction)    GERD (gastroesophageal reflux disease)    occ   Gout    H/O hiatal hernia    Hyperlipidemia    Hypertension    Myocardial infarction ([place])    Neuropathy    toes   Obesity    RBBB    RBBB (right bundle branch block)    Stenosis of right internal carotid artery    50-69% (2014)   Type II diabetes mellitus ([place])    Umbilical hernia    a. s/p repair.   Vertigo    Past Surgical History:  Procedure Laterality Date   ANKLE ARTHROSCOPY WITH DRILLING/MICROFRACTURE Left 04/13/2013   Procedure: LEFT ANKLE ARTHROSCOPY WITH EXTENSIVE DEBRIEDMENT;  Surgeon: Wylene Simmer, MD;  Location: [facility];  Service: Orthopedics;  Laterality: Left;   ANKLE SURGERY Left 08/18/2013   DR HEWITT   CARDIAC CATHETERIZATION  2000   stents x3   CARDIAC CATHETERIZATION  02/15/2014   Procedure: LEFT HEART CATH AND CORS/GRAFTS ANGIOGRAPHY;  Surgeon: Laverda Page, MD;  Location: [facility] CATH LAB;  Service: Cardiovascular;;   COLONOSCOPY     CORONARY ARTERY BYPASS GRAFT  1995   LIMA to LAD, SVG to RCA, SVG to OM.    CORONARY STENT  INTERVENTION N/A 08/12/2020   Procedure: CORONARY STENT INTERVENTION;  Surgeon: Nigel Mormon, MD;  Location: [facility] CV LAB;  Service: Cardiovascular;  Laterality: N/A;   LEFT HEART CATH AND CORS/GRAFTS ANGIOGRAPHY N/A 08/12/2020   Procedure: LEFT HEART CATH AND CORS/GRAFTS ANGIOGRAPHY;  Surgeon: Nigel Mormon, MD;  Location: [place] CV LAB;  Service: Cardiovascular;  Laterality: N/A;   TOTAL ANKLE ARTHROPLASTY Left 08/17/2013   Procedure: LEFT TOTAL ANKLE REPLACEMENT WITH POSSIBLE GASTROC RECESSION ;  Surgeon: Wylene Simmer, MD;  Location: [place];  Service: Orthopedics;  Laterality: Left;   UMBILICAL HERNIA REPAIR  12/2007   Family History  Problem  Relation Age of Onset   Aneurysm Mother    Heart disease Father    Heart attack Father    Stroke Brother     Social History   Tobacco Use   Smoking status: Former    Packs/day: 1.00    Years: 9.00    Pack years: 9.00    Types: Cigarettes    Quit date: 10/31/1970    Years since quitting: 50.5   Smokeless tobacco: Former    Types: Chew   Tobacco comments:    chewed for 2 years in his 20's  Substance Use Topics   Alcohol use: Not Currently    Comment: occ wine last 6 months   ROS  Review of Systems  Constitutional: Negative for malaise/fatigue and weight gain.  Cardiovascular:  Negative for chest pain, claudication, dyspnea on exertion, leg swelling, near-syncope, orthopnea, palpitations, paroxysmal nocturnal dyspnea and syncope.  Respiratory:  Negative for shortness of breath.   Musculoskeletal:  Positive for arthritis.  Gastrointestinal:  Negative for melena.  Neurological:  Positive for dizziness (improved).  Objective  Blood pressure (!) 142/79, pulse 65, temperature 98.2 F (36.8 C), temperature source Temporal, height 5\' 8"  (1.727 m), weight 233 lb (105.7 kg), SpO2 98 %.  Vitals with BMI 05/22/2021 05/22/2021 05/13/2021  Height 5\' 8"  5\' 8"  -  Weight 233 lbs 230 lbs -  BMI 73.41 93.79 -  Systolic 024 097 353  Diastolic 79 82 73  Pulse 65 65 62   Orthostatic VS for the past 72 hrs (Last 3 readings):  Patient Position BP Location Cuff Size  05/22/21 1429 Sitting Left Arm Normal    Physical Exam Vitals reviewed.  Constitutional:      General: He is not in acute distress.    Appearance: He is well-developed. He is obese.  Neck:     Vascular: No JVD.  Cardiovascular:     Rate and Rhythm: Normal rate and regular rhythm.     Pulses:          Carotid pulses are 2+ on the right side and 2+ on the left side.    Heart sounds: Normal heart sounds. No murmur heard.   No gallop.     Comments: No JVD, no leg edema.  Fem and Pop pulse difficult to feel due to body habitus.   DP  Normal and PT absent bilateral Pulmonary:     Effort: Pulmonary effort is normal.     Breath sounds: Rales (left worse than right coarse crackles) present.  Musculoskeletal:     Right lower leg: No edema.     Left lower leg: No edema.  Neurological:     Mental Status: He is alert.  Physical exam unchanged compared to previous.  Laboratory examination:   Recent Labs    08/30/20 1456 09/04/20 1603 05/06/21 1843 05/06/21 1847 05/07/21 0355  05/13/21 1543  NA 139   < > 137 140 135 137  K 4.6   < > 5.0 4.9 4.4 4.2  CL 104   < > 104 105 102 105  CO2 25   < > 21*  --  24 23  GLUCOSE 92   < > 133* 133* 162* 108*  BUN 16   < > 16 18 16 15   CREATININE 1.23*   < > 1.33* 1.30* 1.41* 1.32*  CALCIUM 9.8   < > 9.8  --  9.5 9.0  GFRNONAA 56*   < > 55*  --  51* 55*  GFRAA 65  --   --   --   --   --    < > = values in this interval not displayed.   estimated creatinine clearance is 54.3 mL/min (A) (by C-G formula based on SCr of 1.32 mg/dL (H)).  CMP Latest Ref Rng & Units 05/13/2021 05/07/2021 05/06/2021  Glucose 70 - 99 mg/dL 108(H) 162(H) 133(H)  BUN 8 - 23 mg/dL 15 16 18   Creatinine 0.61 - 1.24 mg/dL 1.32(H) 1.41(H) 1.30(H)  Sodium 135 - 145 mmol/L 137 135 140  Potassium 3.5 - 5.1 mmol/L 4.2 4.4 4.9  Chloride 98 - 111 mmol/L 105 102 105  CO2 22 - 32 mmol/L 23 24 -  Calcium 8.9 - 10.3 mg/dL 9.0 9.5 -  Total Protein 6.5 - 8.1 g/dL 7.6 8.0 -  Total Bilirubin 0.3 - 1.2 mg/dL 0.8 0.4 -  Alkaline Phos 38 - 126 U/L 41 45 -  AST 15 - 41 U/L 25 26 -  ALT 0 - 44 U/L 17 17 -   CBC Latest Ref Rng & Units 05/13/2021 05/07/2021 05/06/2021  WBC 4.0 - 10.5 K/uL 8.1 9.9 -  Hemoglobin 13.0 - 17.0 g/dL 12.1(L) 13.2 15.0  Hematocrit 39.0 - 52.0 % 40.3 41.4 44.0  Platelets 150 - 400 K/uL 271 302 -   Lipid Panel Recent Labs    08/12/20 1253 08/13/20 0306  CHOL 126 110  TRIG 220* 230*  LDLCALC 40 29  VLDL 44* 46*  HDL 42 35*  CHOLHDL 3.0 3.1    HEMOGLOBIN A1C Lab Results   Component Value Date   HGBA1C 7.1 (H) 05/07/2021   MPG 157.07 05/07/2021   TSH No results for input(s): TSH in the last 8760 hours.  Allergies   Allergies  Allergen Reactions   Brilinta [Ticagrelor] Shortness Of Breath   Alpha-Gal Hives   Crestor [Rosuvastatin Calcium] Other (See Comments)    Muscle pain- tolerating this 2022, however   Lipitor [Atorvastatin] Other (See Comments)    Intolerable muscle pain all over     Medications Prior to Visit:   Outpatient Medications Prior to Visit  Medication Sig Dispense Refill   acetaminophen (TYLENOL) 500 MG tablet Take 500 mg by mouth every 6 (six) hours as needed for mild pain.     alum & mag hydroxide-simeth (MYLANTA MAXIMUM STRENGTH) 400-400-40 MG/5ML suspension Take 10 mLs by mouth every 6 (six) hours as needed for indigestion. 355 mL 0   aspirin EC 81 MG tablet Take 1 tablet (81 mg total) by mouth daily.     clopidogrel (PLAVIX) 75 MG tablet Take 1 tablet (75 mg total) by mouth daily. 10 tablet 1   diphenhydrAMINE HCl (BENADRYL ALLERGY PO) Take 1 tablet by mouth as needed (hives).     EPINEPHrine 0.3 mg/0.3 mL IJ SOAJ injection Inject 0.3 mg into the muscle as needed for anaphylaxis.  1 each 0   famotidine (PEPCID) 20 MG tablet Take 1 tablet (20 mg total) by mouth 2 (two) times daily. 60 tablet 1   metFORMIN (GLUCOPHAGE-XR) 500 MG 24 hr tablet TAKE 2 TABLETS EVERY DAY WITH BREAKFAST (Patient taking differently: 1,000 mg daily with breakfast.) 180 tablet 3   nitroGLYCERIN (NITROSTAT) 0.4 MG SL tablet Place 0.4 mg under the tongue every 5 (five) minutes as needed for chest pain.     pantoprazole (PROTONIX) 40 MG tablet Take 1 tablet (40 mg total) by mouth daily. 90 tablet 3   rosuvastatin (CRESTOR) 40 MG tablet TAKE 1 TABLET EVERY DAY (Patient taking differently: Take 40 mg by mouth daily.) 90 tablet 0   Tamsulosin HCl (FLOMAX PO) Take 1 capsule by mouth at bedtime as needed (bladder).     triamcinolone (KENALOG) 0.025 % ointment Apply  1 application topically 2 (two) times daily as needed (itching).     No facility-administered medications prior to visit.   Final Medications at End of Visit    Current Meds  Medication Sig   acetaminophen (TYLENOL) 500 MG tablet Take 500 mg by mouth every 6 (six) hours as needed for mild pain.   alum & mag hydroxide-simeth (MYLANTA MAXIMUM STRENGTH) 400-400-40 MG/5ML suspension Take 10 mLs by mouth every 6 (six) hours as needed for indigestion.   aspirin EC 81 MG tablet Take 1 tablet (81 mg total) by mouth daily.   clopidogrel (PLAVIX) 75 MG tablet Take 1 tablet (75 mg total) by mouth daily.   diphenhydrAMINE HCl (BENADRYL ALLERGY PO) Take 1 tablet by mouth as needed (hives).   EPINEPHrine 0.3 mg/0.3 mL IJ SOAJ injection Inject 0.3 mg into the muscle as needed for anaphylaxis.   famotidine (PEPCID) 20 MG tablet Take 1 tablet (20 mg total) by mouth 2 (two) times daily.   metFORMIN (GLUCOPHAGE-XR) 500 MG 24 hr tablet TAKE 2 TABLETS EVERY DAY WITH BREAKFAST (Patient taking differently: 1,000 mg daily with breakfast.)   nitroGLYCERIN (NITROSTAT) 0.4 MG SL tablet Place 0.4 mg under the tongue every 5 (five) minutes as needed for chest pain.   pantoprazole (PROTONIX) 40 MG tablet Take 1 tablet (40 mg total) by mouth daily.   rosuvastatin (CRESTOR) 40 MG tablet TAKE 1 TABLET EVERY DAY (Patient taking differently: Take 40 mg by mouth daily.)   Tamsulosin HCl (FLOMAX PO) Take 1 capsule by mouth at bedtime as needed (bladder).   triamcinolone (KENALOG) 0.025 % ointment Apply 1 application topically 2 (two) times daily as needed (itching).   valsartan (DIOVAN) 40 MG tablet Take 1 tablet (40 mg total) by mouth daily.    Radiology:   CT angio chest 07/25/2019: 1. No evidence of pulmonary embolism. 2. Scattered ground-glass density throughout both lungs with increased central and subpleural reticulation and areas of mosaic attenuation. No frank honeycombing or significant bronchiectasis. Differential  considerations include chronic hypersensitivity pneumonitis or idiopathic interstitial pneumonia such as NSIP. Pulmonary consultation is suggested, as well as high-resolution chest CT follow-up in 6 months. 3. 5 mm pulmonary nodule along the right major fissure. No follow-up needed if patient is low-risk. Non-contrast chest CT can be considered in 12 months if patient is high-risk.  4.  Aortic atherosclerosis (ICD10-I70.0).  CT Chest 12/04/2020:  1. 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 Findings are consistent with UIP per consensus guidelines: Diagnosis of Idiopathic Pulmonary Fibrosis: An Official ATS/ERS/JRS/ALAT Clinical Practice Guideline. [place], Iss 5,  [id number], Jan 23 2017. 2. Stable borderline mild cardiomegaly. 3. Large hiatal hernia. 4. Aortic Atherosclerosis (ICD10-I70.0).  Cardiac Studies:   Lexiscan myoview stress test 04/11/2018: 1. Lexiscan stress test was performed. Exercise capacity was not assessed. No stress symptoms reported. Blood pressure was normal.  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  Stress EKG is non diagnostic for ischemia as it is a pharmacologic stress. 2. The overall quality of the study is good.  Left ventricular cavity is noted to be normal on the rest and stress studies.  Gated SPECT imaging demonstrates hypokinesis of the basal inferior, basal inferolateral, mid inferior, mid inferolateral, apical inferior and apical lateral myocardial wall(s).  The left ventricular ejection fraction was calculated or visually estimated to be 38%. LSPECT images reveal a large sized, medium intensity, minimally reversible perfusion defect suggestive of large infarct with minimal peri infarct ischemia in LCx/PDA territory.   3.  High risk study.  Overnight oximetry test 10/11/2019  Desaturation below 88% 17.6 minutes.  Left heart catheterization 08/12/2020: LM: Normal LAD: 100% occlusion after D1 LCx: 100% prox occlusion RCA: 100% mid occlusion with ISR LIMA-LAD: Patent SVG-OM3: Patent SVG-RCA: Patent prox-mid stent with 40% lumen loss ( 09/2008 PCI with 38 mm Taxus DES)                90% and 80% lesions in distal part of SVG-RCA                90% lesion at SVG-to RCA anastomosis   LVEF 40-45%. LVEDP 18 mmHg   Successful stenting of SVG to RCA, distal segment with 3.5 x 38 mm and anastamosis with 2.5 x 12 mm resolute Onyx DES.  Echocardiogram 11/06/2020:   Left ventricle cavity is normal in size. Moderate concentric hypertrophy of the left ventricle.  Left ventricle regional wall motion findings: Basal inferolateral, Basal inferior, Mid inferolateral and Mid inferior akinesis. Moderate decrease in global wall motion. Visual EF is 35-40%. Doppler evidence of grade II (pseudonormal) diastolic dysfunction, elevated LAP. Left atrial cavity is moderately dilated by volume. Right ventricle cavity is mildly dilated. Normal right ventricular function. Compared to 08/13/2020, no significant change.  Carotid artery duplex 11/06/2020: Doppler velocity suggests stenosis in the right internal carotid artery (1-15%). Doppler velocity suggests stenosis in the left internal carotid artery (16-49%). Antegrade right vertebral artery flow. Antegrade left vertebral artery flow. Compared to 04/24/2020, right ICA stenosis was 50-69% stenosis. Follow up in one year is appropriate if clinically indicated.   EKG   11/13/2020: Normal sinus rhythm at rate of 64 beats minute, left atrial enlargement, right axis deviation, cannot exclude inferior infarct old.  Right bundle branch block.  No significant change from 08/12/2020. [place] pulmonary disease pattern.    Assessment   1. Chronic combined systolic and diastolic heart failure  ([place])   2. Coronary artery disease of native artery of native heart with stable angina pectoris ([place])   3. Asymptomatic bilateral carotid artery stenosis     Meds ordered this encounter  Medications   valsartan (DIOVAN) 40 MG tablet    Sig: Take 1 tablet (40 mg total) by mouth daily.    Dispense:  30 tablet    Refill:  3    There are no discontinued medications.    Orders Placed This Encounter  Procedures   Basic metabolic panel    Standing Status:   Future  Standing Expiration Date:   05/22/2022      Recommendations:   Stavros Cail.  is a 80 y.o. Caucasian male patient with hypertension, hyperlipidemia, diabetes mellitus diagnosed in 2018, CAD,hx of MI with CABG S/P multiple coronary interventions,  H/O stroke in past without residual deficits, chronic systolic and diastolic CHF with EF 46-96% and asymptomatic carotid artery stenosis, IPF.  Patient underwent PCI to high-grade stenosis of the SVG to RCA in March 2022.  Uptitration of guideline directed medical therapy for heart failure has been limited due to orthostatic hypotension and patient's symptoms of dizziness.  Patient has been resistant to additional medications as well as he feels well overall.    Patient is clinically euvolemic without evidence of acute heart failure.  Blood pressure is mildly elevated in the office today.  After lengthy discussion patient is willing to rechallenge valsartan, not Entresto, with repeat BMP in 1 week.  Patient would benefit from initiation of Imdur and a rechallenge of beta-blocker therapy, however will hold off at this time given history of orthostatic hypotension and patient's hesitancy.  Recommend continuing dual antiplatelet therapy until 07/2021.   Follow-up in 3 months, sooner if needed, for CAD and heart failure, carotid stenosis.    Alethia Berthold, PA-C 05/22/2021, 3:26 PM Office: [phone]

## 2021-05-22 NOTE — Progress Notes (Signed)
Subjective:    Patient ID: Chris Reasons., male    DOB: [birth year], 80 y.o.   MRN: [id number] Recently admitted to hospital (DC summary below for my reference)12/13-12/14:  Brief/Interim Summary: 80 year old male with a history obesity, coronary disease, hypertension, hypokalemia, idiopathic pulmonary fibrosis presents the ER today with sudden onset of shortness of breath and some chest pain after eating "too much" cheese.  Ultimately had coughing with regurgitation of food.  Subsequently had some dyspnea without hypoxia, CTA in the ED ruled out dissection but did show large hiatal hernia.   Given patient is not hypoxic symptoms with negative troponin EKG changes and large hiatal hernia we discussed his symptoms are likely secondary to obstructive issues surrounding hiatal hernia.  Patient will follow-up with outpatient GI as well as surgery for further discussion about possible need for intervention but we discussed that with dietary changes he may not need surgical intervention given his age and comorbid conditions he would prefer to avoid any surgery or procedures which is certainly reasonable.   Patient was ambulated to ensure no ongoing hypoxia in the setting of advanced pulmonary fibrosis, able to ambulate without hypoxia around the ED unit.   Discharge home with close follow-up with GI surgery and PCP as scheduled.   Symptomatic hiatal hernia  See above   Idiopathic pulmonary fibrosis ([place]) Acute exacerbation ruled out Follow-up at [facility] pulmonology.  He is declined antifibrotic therapy with Ofev.   CAD (coronary artery disease) Stable.  Patient had cardiac cath earlier this year.  Continue with aspirin, statin.   Hypertension Patient not on antihypertensive medications due to prior orthostatic hypotension.  See cardiology notes.   Obese Chronic.   Hyperlipemia On statin.   Type II diabetes mellitus ([place]) On metformin.  Add sliding scale due to initiation of  corticosteroids.  HgA1c was 7.1.   Chronic combined systolic and diastolic CHF (congestive heart failure) ([place]) Patient appears euvolemic.  Follow-up outpatient cardiology   OSA (obstructive sleep apnea) Chronic.   Allergic reaction to alpha-gal Patient had a recent tick bite couple months ago.  His alpha gal serologies were positive.  He states that he cannot eat any red meat as he becomes violently ill.  He did have some fried cheese sticks today.  I wonder if the oil that was used to fry his cheese sticks may have also been in contact with any red meat.  Went back to ER 12/20 and was discharge on PPI-protonix and famotidine for hiatal hernia.    05/22/21 Patient feels much better since he started the Protonix and Pepcid.  He has not had any further attacks since.  He is also elevated the head of his bed at home by 4 inches.  He is trying to avoid foods that trigger indigestion.  He states that the attacks felt like a cramp in the center of his chest.  They would occur suddenly after eating.  He states he felt like someone grabbed him underneath his sternum and was twisting.  Attacks would last several hours and would resolve after he got to the emergency room with pain medication.  He denies any melena or hematochezia.  He denies any shortness of breath.  He denies any pleurisy or hemoptysis.  He denies any nausea or vomiting or diarrhea.  Past Medical History:  Diagnosis Date   Abnormal exercise myocardial perfusion study    03/2018- 38% ef, see report   Arthritis    Chronic kidney disease    Congestive  heart failure (CHF) ([place]) 09/2015   EF 35-40% [name]   Coronary artery disease    a. 1995 s/p CABG x 3 (VG->RCA, LIMA->LAD, VG->OM);  b. 07/2008 Inf MI/Cath/PCI: VG->RCA 99 - treated with Taxus DES (75mm), LIMA and VG->OM patent, LAD 100, LCX 100, RCA 60d, EF 50%;  c. 09/2008 PCI native RCA  w/ 2.5x23 Xience DES, VG->RCA stent patent;  c. 05/2010 Cath: Native 3VD with 3/3 patent  grafts, native RCA w 80% ISR prox to graft insertion-->Med Rx.   DOE (dyspnea on exertion)    ED (erectile dysfunction)    GERD (gastroesophageal reflux disease)    occ   Gout    H/O hiatal hernia    Hyperlipidemia    Hypertension    Myocardial infarction ([place])    Neuropathy    toes   Obesity    RBBB    RBBB (right bundle branch block)    Stenosis of right internal carotid artery    50-69% (2014)   Type II diabetes mellitus ([place])    Umbilical hernia    a. s/p repair.   Vertigo     Past Surgical History:  Procedure Laterality Date   ANKLE ARTHROSCOPY WITH DRILLING/MICROFRACTURE Left 04/13/2013   Procedure: LEFT ANKLE ARTHROSCOPY WITH EXTENSIVE DEBRIEDMENT;  Surgeon: Wylene Simmer, MD;  Location: [place];  Service: Orthopedics;  Laterality: Left;   ANKLE SURGERY Left 08/18/2013   DR HEWITT   CARDIAC CATHETERIZATION  2000   stents x3   CARDIAC CATHETERIZATION  02/15/2014   Procedure: LEFT HEART CATH AND CORS/GRAFTS ANGIOGRAPHY;  Surgeon: Laverda Page, MD;  Location: [facility] CATH LAB;  Service: Cardiovascular;;   COLONOSCOPY     CORONARY ARTERY BYPASS GRAFT  1995   LIMA to LAD, SVG to RCA, SVG to OM.    CORONARY STENT INTERVENTION N/A 08/12/2020   Procedure: CORONARY STENT INTERVENTION;  Surgeon: Nigel Mormon, MD;  Location: [place] CV LAB;  Service: Cardiovascular;  Laterality: N/A;   LEFT HEART CATH AND CORS/GRAFTS ANGIOGRAPHY N/A 08/12/2020   Procedure: LEFT HEART CATH AND CORS/GRAFTS ANGIOGRAPHY;  Surgeon: Nigel Mormon, MD;  Location: [place] [facility] CV LAB;  Service: Cardiovascular;  Laterality: N/A;   TOTAL ANKLE ARTHROPLASTY Left 08/17/2013   Procedure: LEFT TOTAL ANKLE REPLACEMENT WITH POSSIBLE GASTROC RECESSION ;  Surgeon: Wylene Simmer, MD;  Location: [place];  Service: Orthopedics;  Laterality: Left;   UMBILICAL HERNIA REPAIR  12/2007   Current Outpatient Medications on File Prior to Visit  Medication Sig Dispense Refill   acetaminophen  (TYLENOL) 500 MG tablet Take 500 mg by mouth every 6 (six) hours as needed for mild pain.     alum & mag hydroxide-simeth (MYLANTA MAXIMUM STRENGTH) 400-400-40 MG/5ML suspension Take 10 mLs by mouth every 6 (six) hours as needed for indigestion. 355 mL 0   aspirin EC 81 MG tablet Take 1 tablet (81 mg total) by mouth daily.     clopidogrel (PLAVIX) 75 MG tablet Take 1 tablet (75 mg total) by mouth daily. 10 tablet 1   diphenhydrAMINE HCl (BENADRYL ALLERGY PO) Take 1 tablet by mouth as needed (hives).     EPINEPHrine 0.3 mg/0.3 mL IJ SOAJ injection Inject 0.3 mg into the muscle as needed for anaphylaxis. 1 each 0   famotidine (PEPCID) 20 MG tablet Take 1 tablet (20 mg total) by mouth 2 (two) times daily. 30 tablet 0   metFORMIN (GLUCOPHAGE-XR) 500 MG 24 hr tablet TAKE 2 TABLETS EVERY DAY  WITH BREAKFAST (Patient taking differently: 1,000 mg daily with breakfast.) 180 tablet 3   nitroGLYCERIN (NITROSTAT) 0.4 MG SL tablet Place 0.4 mg under the tongue every 5 (five) minutes as needed for chest pain.     pantoprazole (PROTONIX) 40 MG tablet Take 1 tablet (40 mg total) by mouth daily. 30 tablet 0   rosuvastatin (CRESTOR) 40 MG tablet TAKE 1 TABLET EVERY DAY (Patient taking differently: Take 40 mg by mouth daily.) 90 tablet 0   Tamsulosin HCl (FLOMAX PO) Take 1 capsule by mouth at bedtime as needed (bladder).     triamcinolone (KENALOG) 0.025 % ointment Apply 1 application topically 2 (two) times daily as needed (itching).     No current facility-administered medications on file prior to visit.   Allergies  Allergen Reactions   Brilinta [Ticagrelor] Shortness Of Breath   Alpha-Gal Hives   Crestor [Rosuvastatin Calcium] Other (See Comments)    Muscle pain- tolerating this 2022, however   Lipitor [Atorvastatin] Other (See Comments)    Intolerable muscle pain all over    Social History   Socioeconomic History   Marital status: Married    Spouse name: Not on file   Number of children: 4   Years of  education: Not on file   Highest education level: Not on file  Occupational History   Not on file  Tobacco Use   Smoking status: Former    Packs/day: 1.00    Years: 9.00    Pack years: 9.00    Types: Cigarettes    Quit date: 10/31/1970    Years since quitting: 50.5   Smokeless tobacco: Former    Types: Chew   Tobacco comments:    chewed for 2 years in his 20's  Vaping Use   Vaping Use: Never used  Substance and Sexual Activity   Alcohol use: Not Currently    Comment: occ wine last 6 months   Drug use: No   Sexual activity: Yes  Other Topics Concern   Not on file  Social History Narrative   Lives in [place] with wife.  Retired.   Social Determinants of Health   Financial Resource Strain: Low Risk    Difficulty of Paying Living Expenses: Not hard at all  Food Insecurity: No Food Insecurity   Worried About Charity fundraiser in the Last Year: Never true   [place] in the Last Year: Never true  Transportation Needs: No Transportation Needs   Lack of Transportation (Medical): No   Lack of Transportation (Non-Medical): No  Physical Activity: Sufficiently Active   Days of Exercise per Week: 5 days   Minutes of Exercise per Session: 30 min  Stress: Stress Concern Present   Feeling of Stress : To some extent  Social Connections: Engineer, building services of Communication with Friends and Family: More than three times a week   Frequency of Social Gatherings with Friends and Family: More than three times a week   Attends Religious Services: More than 4 times per year   Active Member of [employer] or Organizations: Yes   Attends Music therapist: More than 4 times per year   Marital Status: Married  Human resources officer Violence: Not At Risk   Fear of Current or Ex-Partner: No   Emotionally Abused: No   Physically Abused: No   Sexually Abused: No     Review of Systems  All other systems reviewed and are negative.     Objective:   Physical  Exam Vitals reviewed.  Constitutional:      General: He is not in acute distress.    Appearance: He is obese. He is not ill-appearing or toxic-appearing.  Cardiovascular:     Rate and Rhythm: Normal rate and regular rhythm.     Heart sounds: No murmur heard.   No friction rub. No gallop.  Pulmonary:     Effort: Pulmonary effort is normal. No respiratory distress.     Breath sounds: No stridor. No wheezing or rhonchi.  Abdominal:     General: Abdomen is flat. Bowel sounds are normal. There is no distension.     Palpations: Abdomen is soft.     Tenderness: There is no abdominal tenderness. There is no guarding.  Musculoskeletal:     Right lower leg: No edema.     Left lower leg: No edema.  Neurological:     Mental Status: He is alert.          Assessment & Plan:  Hiatal hernia  Esophageal spasm Believe the patient was experiencing an esophageal spasm brought on by acid reflux and a hiatal hernia.  I recommended continuing Protonix and famotidine for 6 weeks.  After 6 weeks if he remains asymptomatic, I would decrease to Protonix 40 mg daily alone.  I will consult GI as requested by the emergency room and await their recommendations.  Patient has never had an EGD.

## 2021-06-03 ENCOUNTER — Telehealth: Payer: Self-pay | Admitting: Family Medicine

## 2021-06-03 NOTE — Telephone Encounter (Signed)
Patient called to follow up on referral to GI specialist to address hiatal hernia. Patient requesting call back from referral coordinator to schedule asap. Please advise at [phone]. Ok to leave a detailed message if patient doesn't answer.

## 2021-06-09 ENCOUNTER — Telehealth: Payer: Self-pay | Admitting: Pharmacist

## 2021-06-09 NOTE — Progress Notes (Addendum)
Chronic Care Management Pharmacy Assistant   Name: Chris [name].  MRN: [id number] DOB: October 26, 1940   Reason for Encounter: Disease State - Hypertension Call    Recent office visits:  05/22/21 Jenna Luo, MD - Family Medicine - Hiatal hernia - Referral to GI. Believe the patient was experiencing an esophageal spasm brought on by acid reflux and a hiatal hernia.  I recommended continuing Protonix and famotidine for 6 weeks.  After 6 weeks if he remains asymptomatic, I would decrease to Protonix 40 mg daily alone.  I will consult GI as requested by the emergency room and await their recommendations.  Recent consult visits:  05/22/21 Lawerance Cruel, PA-C - Cardiology - Chronic combined systolic and diastolic heart failure -  Labs were ordered. valsartan (DIOVAN) 40 MG tablet prescribed. Repeat BMP in 1 week. Follow up in 3 months.  03/11/21 Salvatore Marvel, MD - Anaphylactic shock - Asthma / Allergy - Continue to avoid red meat. Follow up in 6 months.   Hospital visits: 05/13/21 Medication Reconciliation was completed by comparing discharge summary, patients EMR and Pharmacy list, and upon discussion with patient.  Admitted to the hospital on 05/13/21 due to Epi gastric pain. Discharge date was 05/13/21. Discharged from [place]?Medications Started at [facility] Discharge:?? alum & mag hydroxide-simeth (MYLANTA MAXIMUM STRENGTH) 400-400-40 MG/5ML suspension famotidine (PEPCID) 20 MG tablet pantoprazole (PROTONIX) 40 MG tablet  Medication Changes at Hospital Discharge: None noted.   Medications Discontinued at Hospital Discharge: None noted.   Medications that remain the same after Hospital Discharge:??  All other medications will remain the same.    Hospital visits: 05/06/21 Medication Reconciliation was completed by comparing discharge summary, patients EMR and Pharmacy list, and upon discussion with patient.  Admitted to the hospital on 05/06/21  due to CHF. Discharge date was 05/07/21. Discharged from [place]?Medications Started at [facility] Discharge:?? None noted.   Medication Changes at Hospital Discharge: None noted.   Medications Discontinued at Hospital Discharge: None noted.   Medications that remain the same after Hospital Discharge:??  All other medications will remain the same.   Hospital visits: 12/12/20 - 12/13/20 Medication Reconciliation was completed by comparing discharge summary, patients EMR and Pharmacy list, and upon discussion with patient.  Admitted to the hospital on 12/12/20 due to allergic reaction. Discharge date was 12/13/20. Discharged from [place]?Medications Started at [facility] Discharge:?? EPINEPHrine 0.3 mg/0.3 mL IJ SOAJ injection  Medication Changes at Hospital Discharge: None noted.   Medications Discontinued at Hospital Discharge: None noted.  Medications that remain the same after Hospital Discharge:??  All other medications will remain the same.   Medications: Outpatient Encounter Medications as of 06/09/2021  Medication Sig   acetaminophen (TYLENOL) 500 MG tablet Take 500 mg by mouth every 6 (six) hours as needed for mild pain.   alum & mag hydroxide-simeth (MYLANTA MAXIMUM STRENGTH) 400-400-40 MG/5ML suspension Take 10 mLs by mouth every 6 (six) hours as needed for indigestion.   aspirin EC 81 MG tablet Take 1 tablet (81 mg total) by mouth daily.   clopidogrel (PLAVIX) 75 MG tablet Take 1 tablet (75 mg total) by mouth daily.   diphenhydrAMINE HCl (BENADRYL ALLERGY PO) Take 1 tablet by mouth as needed (hives).   EPINEPHrine 0.3 mg/0.3 mL IJ SOAJ injection Inject 0.3 mg into the muscle as needed for anaphylaxis.   famotidine (PEPCID) 20 MG tablet Take 1 tablet (20 mg total)  by mouth 2 (two) times daily.   metFORMIN (GLUCOPHAGE-XR) 500 MG 24 hr tablet TAKE 2 TABLETS EVERY DAY WITH BREAKFAST (Patient taking differently: 1,000 mg daily with  breakfast.)   nitroGLYCERIN (NITROSTAT) 0.4 MG SL tablet Place 0.4 mg under the tongue every 5 (five) minutes as needed for chest pain.   pantoprazole (PROTONIX) 40 MG tablet Take 1 tablet (40 mg total) by mouth daily.   rosuvastatin (CRESTOR) 40 MG tablet TAKE 1 TABLET EVERY DAY (Patient taking differently: Take 40 mg by mouth daily.)   Tamsulosin HCl (FLOMAX PO) Take 1 capsule by mouth at bedtime as needed (bladder).   triamcinolone (KENALOG) 0.025 % ointment Apply 1 application topically 2 (two) times daily as needed (itching).   valsartan (DIOVAN) 40 MG tablet Take 1 tablet (40 mg total) by mouth daily.   No facility-administered encounter medications on file as of 06/09/2021.    Current antihypertensive regimen:  Valsartan 40 mg 1 tablet daily    How often are you checking your Blood Pressure?  Patient reported checking blood pressures daily.    Current home BP readings: 130/75 yesterday    What recent interventions/DTPs have been made by any provider to improve Blood Pressure control since last CPP Visit:  Patient was placed back on Valsartan 40 mg daily by Dr Dennard Schaumann.    Any recent hospitalizations or ED visits since last visit with CPP?  Patient had 3 ED visits on 12/12/20, 05/06/21, and 05/13/21   What diet changes have been made to improve Blood Pressure Control?  Patient recently stopped eating red meat due to an allergic reaction.    What exercise is being done to improve your Blood Pressure Control?  Patient reported he tries to remain active and walks his dog.     Adherence Review: Is the patient currently on ACE/ARB medication? Yes Does the patient have >5 day gap between last estimated fill dates? No   Care Gaps  AWV: done 02/07/21 Colonoscopy: unknown  DM Eye Exam: due 02/10/20 DM Foot Exam: due 11/10/19 Microalbumin: done 11/11/18 HbgAIC: done 05/07/21 (7.1) DEXA: N/A Mammogram: N/A  Star Rating Drugs: Valsartan (DIOVAN) 40 MG tablet - last filled  unavailable Rosuvastatin (CRESTOR) 40 MG tablet - last filled 04/07/21 90 days  MetFORMIN (GLUCOPHAGE-XR) 500 MG 24 hr tablet - last filled 04/07/21 90 days   Future Appointments  Date Time Provider [place]  08/20/2021  2:15 PM Alethia Berthold, PA-C PCV-PCV None  11/11/2021  2:00 PM PCV-ECHO/VAS 1 PCV-IMG None  02/13/2022  1:15 PM BSFM-NURSE HEALTH ADVISOR B[facility]M-BSFM None   Jobe Gibbon, [place] Clinical Pharmacist Assistant  [phone]  7 minutes spent in review, coordination, and documentation.  Reviewed by: Beverly Milch, PharmD Clinical Pharmacist [phone]

## 2021-06-13 ENCOUNTER — Ambulatory Visit: Payer: Medicare HMO | Admitting: Gastroenterology

## 2021-06-13 ENCOUNTER — Encounter: Payer: Self-pay | Admitting: Gastroenterology

## 2021-06-13 VITALS — BP 110/60 | HR 97 | Ht 68.0 in | Wt 234.0 lb

## 2021-06-13 DIAGNOSIS — R079 Chest pain, unspecified: Secondary | ICD-10-CM | POA: Insufficient documentation

## 2021-06-13 DIAGNOSIS — Z7902 Long term (current) use of antithrombotics/antiplatelets: Secondary | ICD-10-CM | POA: Insufficient documentation

## 2021-06-13 DIAGNOSIS — K449 Diaphragmatic hernia without obstruction or gangrene: Secondary | ICD-10-CM

## 2021-06-13 DIAGNOSIS — R1310 Dysphagia, unspecified: Secondary | ICD-10-CM

## 2021-06-13 DIAGNOSIS — Z7901 Long term (current) use of anticoagulants: Secondary | ICD-10-CM | POA: Insufficient documentation

## 2021-06-13 DIAGNOSIS — R0789 Other chest pain: Secondary | ICD-10-CM

## 2021-06-13 NOTE — Patient Instructions (Signed)
If you are age 79 or older, your body mass index should be between 23-30. Your Body mass index is 35.58 kg/m. If this is out of the aforementioned range listed, please consider follow up with your Primary Care Provider. ________________________________________________________  The [facility] providers would like to encourage you to use [facility] to communicate with providers for non-urgent requests or questions.  Due to long hold times on the telephone, sending your provider a message by [facility] may be a faster and more efficient way to get a response.  Please allow 48 business hours for a response.  Please remember that this is for non-urgent requests.  _______________________________________________________  Chris [name] have been scheduled for an endoscopy. Please follow written instructions given to you at your visit today. If you use inhalers (even only as needed), please bring them with you on the day of your procedure.  You will be contacted by our office prior to your procedure for directions on holding your Plavix.  If you do not hear from our office 1 week prior to your scheduled procedure, please call [phone] to discuss.   Follow up pending the results of your Endoscopy or as needed.  Thank you for entrusting me with your care and choosing [facility].  Chris Bogus, PA-C

## 2021-06-13 NOTE — Telephone Encounter (Signed)
[facility] Pre-operative Risk Assessment     Request for surgical clearance:     Endoscopy Procedure  What type of surgery is being performed?     Endoscopy  When is this surgery scheduled?     07/02/2021  What type of clearance is required ?   Pharmacy  Are there any medications that need to be held prior to surgery and how long? Plavix starting 5 days prior  Practice name and name of physician performing surgery?      [facility]  What is your office phone and fax number?      Phone- [phone]  [phone]  Anesthesia type (None, local, MAC, general) ?       MAC

## 2021-06-13 NOTE — Progress Notes (Signed)
06/13/2021 Chris [name]. [id number] Nov 25, 1940   HISTORY OF PRESENT ILLNESS: This is a 81 year old male who is new to our office.  He has been referred here by his PCP, Dr. Dennard Schaumann, for evaluation regarding a large hiatal hernia that was seen on recent CT imaging.  The patient had 2 episodes of severe central chest pain back in December.  These occurred a week apart.  Extensive evaluation included CT angio of the chest, abdomen, pelvis.  It showed a large hiatal hernia.  He was placed on pantoprazole 40 mg daily and Pepcid 20 mg twice daily.  He says that since beginning those medications he has not had any more episodes.  Leading up to all that he had not been on any type of acid reflux therapy and did not really feel like he got frequent acid reflux, etc. He reports dysphagia to pills, occurring up in his pharynx.  He says that he has difficulty swallowing them, they tend to get stuck and one time they all stuck together in a big glob and he ended up getting them to come back up and they were all "cemented together".  He has never had an EGD in the past.  He reports that he had a colonoscopy several years ago and is never going to have another one performed.  He is on Plavix for history of coronary artery disease and just had 2 stents placed in March 2022.  His cardiologist is [name].   Past Medical History:  Diagnosis Date   Abnormal exercise myocardial perfusion study    03/2018- 38% ef, see report   Arthritis    Chronic kidney disease    Congestive heart failure (CHF) ([place]) 09/2015   EF 35-40% [name]   Coronary artery disease    a. 1995 s/p CABG x 3 (VG->RCA, LIMA->LAD, VG->OM);  b. 07/2008 Inf MI/Cath/PCI: VG->RCA 99 - treated with Taxus DES (25mm), LIMA and VG->OM patent, LAD 100, LCX 100, RCA 60d, EF 50%;  c. 09/2008 PCI native RCA  w/ 2.5x23 Xience DES, VG->RCA stent patent;  c. 05/2010 Cath: Native 3VD with 3/3 patent grafts, native RCA w 80% ISR prox to graft  insertion-->Med Rx.   DOE (dyspnea on exertion)    ED (erectile dysfunction)    GERD (gastroesophageal reflux disease)    occ   Gout    H/O hiatal hernia    Hyperlipidemia    Hypertension    Myocardial infarction ([place])    Neuropathy    toes   Obesity    RBBB    RBBB (right bundle branch block)    Stenosis of right internal carotid artery    50-69% (2014)   Type II diabetes mellitus ([place])    Umbilical hernia    a. s/p repair.   Vertigo    Past Surgical History:  Procedure Laterality Date   ANKLE ARTHROSCOPY WITH DRILLING/MICROFRACTURE Left 04/13/2013   Procedure: LEFT ANKLE ARTHROSCOPY WITH EXTENSIVE DEBRIEDMENT;  Surgeon: Wylene Simmer, MD;  Location: [place];  Service: Orthopedics;  Laterality: Left;   ANKLE SURGERY Left 08/18/2013   DR HEWITT   CARDIAC CATHETERIZATION  2000   stents x3   CARDIAC CATHETERIZATION  02/15/2014   Procedure: LEFT HEART CATH AND CORS/GRAFTS ANGIOGRAPHY;  Surgeon: Laverda Page, MD;  Location: [facility] CATH LAB;  Service: Cardiovascular;;   COLONOSCOPY     CORONARY ARTERY BYPASS GRAFT  1995   LIMA to LAD, SVG to RCA, SVG  to OM.    CORONARY STENT INTERVENTION N/A 08/12/2020   Procedure: CORONARY STENT INTERVENTION;  Surgeon: Nigel Mormon, MD;  Location: [place] CV LAB;  Service: Cardiovascular;  Laterality: N/A;   LEFT HEART CATH AND CORS/GRAFTS ANGIOGRAPHY N/A 08/12/2020   Procedure: LEFT HEART CATH AND CORS/GRAFTS ANGIOGRAPHY;  Surgeon: Nigel Mormon, MD;  Location: [place] CV LAB;  Service: Cardiovascular;  Laterality: N/A;   TOTAL ANKLE ARTHROPLASTY Left 08/17/2013   Procedure: LEFT TOTAL ANKLE REPLACEMENT WITH POSSIBLE GASTROC RECESSION ;  Surgeon: Wylene Simmer, MD;  Location: [place];  Service: Orthopedics;  Laterality: Left;   UMBILICAL HERNIA REPAIR  12/2007    reports that he quit smoking about 50 years ago. His smoking use included cigarettes. He has a 9.00 pack-year smoking history. He has quit using  smokeless tobacco.  His smokeless tobacco use included chew. He reports that he does not currently use alcohol. He reports that he does not use drugs. family history includes Aneurysm in his mother; Heart attack in his father; Heart disease in his father; Stroke in his brother. Allergies  Allergen Reactions   Brilinta [Ticagrelor] Shortness Of Breath   Alpha-Gal Hives   Crestor [Rosuvastatin Calcium] Other (See Comments)    Muscle pain- tolerating this 2022, however   Lipitor [Atorvastatin] Other (See Comments)    Intolerable muscle pain all over       Outpatient Encounter Medications as of 06/13/2021  Medication Sig   acetaminophen (TYLENOL) 500 MG tablet Take 500 mg by mouth every 6 (six) hours as needed for mild pain.   alum & mag hydroxide-simeth (MYLANTA MAXIMUM STRENGTH) 400-400-40 MG/5ML suspension Take 10 mLs by mouth every 6 (six) hours as needed for indigestion.   aspirin EC 81 MG tablet Take 1 tablet (81 mg total) by mouth daily.   clopidogrel (PLAVIX) 75 MG tablet Take 1 tablet (75 mg total) by mouth daily.   diphenhydrAMINE HCl (BENADRYL ALLERGY PO) Take 1 tablet by mouth as needed (hives).   EPINEPHrine 0.3 mg/0.3 mL IJ SOAJ injection Inject 0.3 mg into the muscle as needed for anaphylaxis.   famotidine (PEPCID) 20 MG tablet Take 1 tablet (20 mg total) by mouth 2 (two) times daily.   metFORMIN (GLUCOPHAGE-XR) 500 MG 24 hr tablet TAKE 2 TABLETS EVERY DAY WITH BREAKFAST (Patient taking differently: 1,000 mg daily with breakfast.)   nitroGLYCERIN (NITROSTAT) 0.4 MG SL tablet Place 0.4 mg under the tongue every 5 (five) minutes as needed for chest pain.   pantoprazole (PROTONIX) 40 MG tablet Take 1 tablet (40 mg total) by mouth daily.   rosuvastatin (CRESTOR) 40 MG tablet TAKE 1 TABLET EVERY DAY (Patient taking differently: Take 40 mg by mouth daily.)   Tamsulosin HCl (FLOMAX PO) Take 1 capsule by mouth at bedtime as needed (bladder).   triamcinolone (KENALOG) 0.025 % ointment  Apply 1 application topically 2 (two) times daily as needed (itching).   valsartan (DIOVAN) 40 MG tablet Take 1 tablet (40 mg total) by mouth daily.   No facility-administered encounter medications on file as of 06/13/2021.     REVIEW OF SYSTEMS  : All other systems reviewed and negative except where noted in the History of Present Illness.   PHYSICAL EXAM: BP 110/60    Pulse 97    Ht 5\' 8"  (1.727 m)    Wt 234 lb (106.1 kg)    SpO2 98%    BMI 35.58 kg/m  General: Well developed white male in no acute distress Head:  Normocephalic and atraumatic Eyes:  Sclerae anicteric, conjunctiva pink. Ears: Normal auditory acuity Lungs: Clear throughout to auscultation; no W/R/R. Heart: Regular rate and rhythm; no M/R/G. Abdomen: Soft, non-distended.  BS present.  Non-tender. Musculoskeletal: Symmetrical with no gross deformities  Skin: No lesions on visible extremities Extremities: No edema  Neurological: Alert oriented x 4, grossly non-focal Psychological:  Alert and cooperative. Normal mood and affect  ASSESSMENT AND PLAN: *Atypical chest pain/dysphagia/large hiatal hernia: Had 2 episodes of severe central chest pain back in December.  CT scan showed large hiatal hernia.  He was placed on pantoprazole 40 mg daily and Pepcid twice daily.  Has not had any further episodes since then.  I think that he should undergo EGD.  Question if this could been esophagitis versus esophageal spasms, I think less likely volvulus of the hiatal hernia, etc. as that was not seen on CT imaging.  We will plan for this with Dr. Rush Landmark.  Continue pantoprazole 40 mg daily and Pepcid twice daily for now.  Dysphagia to pills may need to be evaluated by ENT or possibly modified barium swallow study pending results of EGD. *Chronic antiplatelet use with Plavix due to history of coronary artery disease with stent.  He just had stents placed in March 2022.  We will reach out to cardiology to see if they will allow [place] to hold  the Plavix for 5 days or if they prefer that he wait until he completes a year of therapy. *Colorectal cancer screening: Patient states that he had a colonoscopy several years ago and he will never have another one.  We discussed the possibility of doing a Cologuard instead with the understanding that if that were positive then we would strongly suggest a colonoscopy.  He will consider that for the future, but declined for now.   CC:  Susy Frizzle, MD

## 2021-06-14 NOTE — Progress Notes (Signed)
Attending Physician's Attestation   I have reviewed the chart.   I agree with the Advanced Practitioner's note, impression, and recommendations with any updates as below. If unable to obtain approval for Plavix hold, then recommend we begin with upper GI series/barium swallow to further define the hiatal hernia in real-time.   Justice Britain, MD [facility] Advanced Endoscopy Office # [phone]

## 2021-06-16 NOTE — Telephone Encounter (Signed)
Would prefer to wait until patient has completed a year of DAPT. If the endoscopy could be postponed until  March or April 2023 would recommend that. If not I can discuss further with interventionalist if they are okay with holding Plavix sooner.

## 2021-06-16 NOTE — Telephone Encounter (Signed)
Left message for patient to return phone call to discuss pushing back his Endoscopy.

## 2021-06-17 NOTE — Telephone Encounter (Signed)
Left message for patient to call the office. I will also send a message to him through [place].

## 2021-06-17 NOTE — Telephone Encounter (Signed)
Patient has returned phone call. I have made him aware that he will be unable to hold his Plavix, and that we will need to cancel his appointment. I have let him know that since it has been suggested that he would be able to proceed in March or April that I will hold this message so that I can call him in a few weeks to get him rescheduled. He has expressed understanding.

## 2021-06-30 NOTE — Telephone Encounter (Signed)
I have called patient to discuss doing an upper GI series. He states that he has "a call in to [place]" and will discuss everything with him. He advised that he would call me back and let me know if he wants to proceed.

## 2021-06-30 NOTE — Telephone Encounter (Signed)
Zehr, Laban Emperor, PA-C  Talent, Yvonne Kendall, [place]  Dr. Rush Landmark said if they don't want him to hold it then let's do an UGI series instead.  Please schedule that if patient is agreeable.        Previous Messages   ----- Message -----  From: Aleatha Borer, CMA  Sent: 06/17/2021   4:20 PM EST  To: Loralie Champagne, PA-C  Subject: FYI                                             Just wanted to give you heads up that I had to cancel this patient's procedure. He is aware. Cardiology wants him to wait until March/April t o be able to hold Plavix.

## 2021-07-02 ENCOUNTER — Encounter: Payer: Medicare HMO | Admitting: Gastroenterology

## 2021-08-12 ENCOUNTER — Other Ambulatory Visit: Payer: Self-pay | Admitting: Student

## 2021-08-12 DIAGNOSIS — I5042 Chronic combined systolic (congestive) and diastolic (congestive) heart failure: Secondary | ICD-10-CM | POA: Diagnosis not present

## 2021-08-13 LAB — BASIC METABOLIC PANEL
BUN/Creatinine Ratio: 13 (ref 10–24)
BUN: 18 mg/dL (ref 8–27)
CO2: 22 mmol/L (ref 20–29)
Calcium: 9.9 mg/dL (ref 8.6–10.2)
Chloride: 103 mmol/L (ref 96–106)
Creatinine, Ser: 1.35 mg/dL — ABNORMAL HIGH (ref 0.76–1.27)
Glucose: 140 mg/dL — ABNORMAL HIGH (ref 70–99)
Potassium: 4.7 mmol/L (ref 3.5–5.2)
Sodium: 139 mmol/L (ref 134–144)
eGFR: 54 mL/min/{1.73_m2} — ABNORMAL LOW (ref >59)

## 2021-08-20 ENCOUNTER — Encounter: Payer: Self-pay | Admitting: Student

## 2021-08-20 ENCOUNTER — Ambulatory Visit: Payer: Medicare HMO | Admitting: Student

## 2021-08-20 VITALS — BP 123/74 | HR 98 | Temp 98.0°F | Resp 17 | Ht 68.0 in | Wt 234.0 lb

## 2021-08-20 DIAGNOSIS — R0609 Other forms of dyspnea: Secondary | ICD-10-CM

## 2021-08-20 DIAGNOSIS — I5042 Chronic combined systolic (congestive) and diastolic (congestive) heart failure: Secondary | ICD-10-CM | POA: Diagnosis not present

## 2021-08-20 DIAGNOSIS — I25118 Atherosclerotic heart disease of native coronary artery with other forms of angina pectoris: Secondary | ICD-10-CM

## 2021-08-20 NOTE — Progress Notes (Signed)
? ?Primary Physician/Referring:  Susy Frizzle, MD ? ?Patient ID: Chris Reasons., male    DOB: April 29, 1941, 81 y.o.   MRN: [id number] ? ?Chief Complaint  ?Patient presents with  ? HLD  ? HF  ?  3 MONTH  ? ? ?HPI:   ? ?Chris Reasons.  is a 81 y.o. Caucasian male patient with hypertension, hyperlipidemia, diabetes mellitus diagnosed in 2018, CAD,hx of MI with CABG S/P multiple coronary interventions,  H/O stroke in past without residual deficits, chronic systolic and diastolic CHF with EF 17-40% and asymptomatic carotid artery stenosis, IPF.  Patient underwent PCI to high-grade stenosis of the SVG to RCA in March 2022. ? ?Uptitration of guideline directed medical therapy for heart failure has been limited due to orthostatic hypotension and patient's symptoms of dizziness.  Patient has been resistant to additional medications as well as he feels well overall. ? ?Patient presents for 79-monthfollow-up.  Since last office visit patient was seen by gastroenterology who recommended endoscopy to evaluate severity hiatal hernia, as suspect this is contributing to patient's shortness of breath.  Patient continues to have dyspnea on exertion which is stable.  He also has occasional episodes of atypical chest pain, particularly after eating. ? ?Past Medical History:  ?Diagnosis Date  ? Abnormal exercise myocardial perfusion study   ? 03/2018- 38% ef, see report  ? Arthritis   ? Chronic kidney disease   ? [place]  ? EF 35-40% Dr. CVear Clock ? Coronary artery disease   ? a. 1995 s/p CABG x 3 (VG->RCA, LIMA->LAD, VG->OM);  b. 07/2008 Inf MI/Cath/PCI: VG->RCA 99 - treated with Taxus DES (363m, LIMA and VG->OM patent, LAD 100, LCX 100, RCA 60d, EF 50%;  c. 09/2008 PCI native RCA  w/ 2.5x23 Xience DES, VG->RCA stent patent;  c. 05/2010 Cath: Native 3VD with 3/3 patent grafts, native RCA w 80% ISR prox to graft insertion-->Med Rx.  ? DOE (dyspnea on exertion)   ? ED (erectile dysfunction)    ? GERD (gastroesophageal reflux disease)   ? occ  ? Gout   ? H/O hiatal hernia   ? Hyperlipidemia   ? Hypertension   ? [facility]  ? Neuropathy   ? toes  ? Obesity   ? RBBB   ? RBBB (right bundle branch block)   ? Stenosis of right internal carotid artery   ? 50-69% (2014)  ? Type II diabetes mellitus ([place]  ? Umbilical hernia   ? a. s/p repair.  ? Vertigo   ? ?Past Surgical History:  ?Procedure Laterality Date  ? ANKLE ARTHROSCOPY WITH DRILLING/MICROFRACTURE Left 04/13/2013  ? Procedure: LEFT ANKLE ARTHROSCOPY WITH EXTENSIVE DEBRIEDMENT;  Surgeon: JoWylene SimmerMD;  [facility] Service[facility];  Laterality: Left;  ? ANKLE SURGERY Left 08/18/2013  ? DR HEWITT  ? CARDIAC CATHETERIZATION  2000  ? stents x3  ? CARDIAC CATHETERIZATION  02/15/2014  ? Procedure: LEFT HEART CATH AND CORS/GRAFTS ANGIOGRAPHY;  Surgeon: JaLaverda PageMD;  [facility];  Service: Cardiovascular;;  ? COLONOSCOPY    ? CORONARY ARTERY BYPASS GRAFT  1995  ? LIMA to LAD, SVG to RCA, SVG to OM.   ? CORONARY STENT INTERVENTION N/A 08/12/2020  ? Procedure: CORONARY STENT INTERVENTION;  Surgeon: PaNigel MormonMD;  [facility];  Service: Cardiovascular;  Laterality: N/A;  ? LEFT HEART CATH AND CORS/GRAFTS ANGIOGRAPHY N/A 08/12/2020  ?  Procedure: LEFT HEART CATH AND CORS/GRAFTS ANGIOGRAPHY;  Surgeon: Nigel Mormon, MD;  Location: [place] CV LAB;  Service: Cardiovascular;  Laterality: N/A;  ? TOTAL ANKLE ARTHROPLASTY Left 08/17/2013  ? Procedure: LEFT TOTAL ANKLE REPLACEMENT WITH POSSIBLE GASTROC RECESSION ;  Surgeon: Wylene Simmer, MD;  Location: [place];  Service: Orthopedics;  Laterality: Left;  ? UMBILICAL HERNIA REPAIR  12/2007  ? ?Family History  ?Problem Relation Age of Onset  ? Aneurysm Mother   ? Heart disease Father   ? Heart attack Father   ? Stroke Brother   ?  ?Social History  ? ?Tobacco Use  ? Smoking status: Former  ?  Packs/day: 1.00  ?  Years: 9.00   ?  Pack years: 9.00  ?  Types: Cigarettes  ?  Quit date: 10/31/1970  ?  Years since quitting: 50.8  ? Smokeless tobacco: Former  ?  Types: Chew  ? Tobacco comments:  ?  chewed for 2 years in his 20's  ?Substance Use Topics  ? Alcohol use: Not Currently  ?  Comment: occ wine last 6 months  ? ?ROS  ?Review of Systems  ?Constitutional: Negative for malaise/fatigue and weight gain.  ?Cardiovascular:  Positive for dyspnea on exertion (stable). Negative for chest pain, claudication, leg swelling, near-syncope, orthopnea, palpitations, paroxysmal nocturnal dyspnea and syncope.  ?Musculoskeletal:  Positive for arthritis.  ?Gastrointestinal:  Negative for melena.  ?Neurological:  Positive for dizziness (improved).  ?Objective  ?Blood pressure 123/74, pulse 98, temperature 98 ?F (36.7 ?C), temperature source Temporal, resp. rate 17, height '5\' 8"'$  (1.727 m), weight 234 lb (106.1 kg), SpO2 98 %.  ? ?  08/20/2021  ?  2:05 PM 06/13/2021  ?  1:53 PM 05/22/2021  ?  2:29 PM  ?Vitals with BMI  ?Height '5\' 8"'$  '5\' 8"'$  '5\' 8"'$   ?Weight 234 lbs 234 lbs 233 lbs  ?BMI 35.59 35.59 35.44  ?Systolic 242 353 614  ?Diastolic 74 60 79  ?Pulse 98 97 65  ? ?Orthostatic VS for the past 72 hrs (Last 3 readings): ? Patient Position BP Location Cuff Size  ?08/20/21 1405 Sitting Left Arm Large  ? ? Physical Exam ?Vitals reviewed.  ?Constitutional:   ?   General: He is not in acute distress. ?   Appearance: He is well-developed. He is obese.  ?Neck:  ?   Vascular: No JVD.  ?Cardiovascular:  ?   Rate and Rhythm: Normal rate and regular rhythm.  ?   Pulses:     ?     Carotid pulses are 2+ on the right side and 2+ on the left side. ?   Heart sounds: Normal heart sounds. No murmur heard. ?  No gallop.  ?   Comments: No JVD, no leg edema.  Fem and Pop pulse difficult to feel due to body habitus.  DP  Normal and PT absent bilateral ?Pulmonary:  ?   Effort: Pulmonary effort is normal.  ?   Breath sounds: Rales (left worse than right coarse crackles) present.   ?Musculoskeletal:  ?   Right lower leg: No edema.  ?   Left lower leg: No edema.  ?Neurological:  ?   Mental Status: He is alert.  ?Physical exam unchanged compared to previous office visit. ? ?Laboratory examination:  ? ?Recent Labs  ?  08/30/20 ?1456 09/04/20 ?1603 05/06/21 ?1843 05/06/21 ?1847 05/07/21 ?0355 05/13/21 ?1543 08/12/21 ?1253  ?NA 139   < > 137   < > 135 137 139  ?  K 4.6   < > 5.0   < > 4.4 4.2 4.7  ?CL 104   < > 104   < > 102 105 103  ?CO2 25   < > 21*  --  '24 23 22  '$ ?GLUCOSE 92   < > 133*   < > 162* 108* 140*  ?BUN 16   < > 16   < > '16 15 18  '$ ?CREATININE 1.23*   < > 1.33*   < > 1.41* 1.32* 1.35*  ?CALCIUM 9.8   < > 9.8  --  9.5 9.0 9.9  ?GFRNONAA 56*   < > 55*  --  51* 55*  --   ?GFRAA 65  --   --   --   --   --   --   ? < > = values in this interval not displayed.  ? ?estimated creatinine clearance is 53.3 mL/min (A) (by C-G formula based on SCr of 1.35 mg/dL (H)).  ? ?  Latest Ref Rng & Units 08/12/2021  ? 12:53 PM 05/13/2021  ?  3:43 PM 05/07/2021  ?  3:55 AM  ?CMP  ?Glucose 70 - 99 mg/dL 140   108   162    ?BUN 8 - 27 mg/dL '18   15   16    '$ ?Creatinine 0.76 - 1.27 mg/dL 1.35   1.32   1.41    ?Sodium 134 - 144 mmol/L 139   137   135    ?Potassium 3.5 - 5.2 mmol/L 4.7   4.2   4.4    ?Chloride 96 - 106 mmol/L 103   105   102    ?CO2 20 - 29 mmol/L '22   23   24    '$ ?Calcium 8.6 - 10.2 mg/dL 9.9   9.0   9.5    ?Total Protein 6.5 - 8.1 g/dL  7.6   8.0    ?Total Bilirubin 0.3 - 1.2 mg/dL  0.8   0.4    ?Alkaline Phos 38 - 126 U/L  41   45    ?AST 15 - 41 U/L  25   26    ?ALT 0 - 44 U/L  17   17    ? ? ?  Latest Ref Rng & Units 05/13/2021  ?  3:43 PM 05/07/2021  ?  3:55 AM 05/06/2021  ?  6:47 PM  ?CBC  ?WBC 4.0 - 10.5 K/uL 8.1   9.9     ?Hemoglobin 13.0 - 17.0 g/dL 12.1   13.2   15.0    ?Hematocrit 39.0 - 52.0 % 40.3   41.4   44.0    ?Platelets 150 - 400 K/uL 271   302     ? ?Lipid Panel ?No results for input(s): CHOL, TRIG, LDLCALC, VLDL, HDL, CHOLHDL, LDLDIRECT in the last 8760 hours. ?  ?HEMOGLOBIN  A1C ?Lab Results  ?Component Value Date  ? HGBA1C 7.1 (H) 05/07/2021  ? MPG 157.07 05/07/2021  ? ?TSH ?No results for input(s): TSH in the last 8760 hours. ? ?Allergies  ? ?Allergies  ?Allergen Reactions  ? Brilint

## 2021-09-04 ENCOUNTER — Encounter (HOSPITAL_COMMUNITY): Payer: Self-pay | Admitting: *Deleted

## 2021-09-04 ENCOUNTER — Other Ambulatory Visit: Payer: Self-pay

## 2021-09-04 ENCOUNTER — Inpatient Hospital Stay (HOSPITAL_COMMUNITY)
Admission: EM | Admit: 2021-09-04 | Discharge: 2021-09-05 | DRG: 246 | Disposition: A | Discharge status: Home / Self Care | Payer: Medicare HMO | Attending: Cardiology | Admitting: Cardiology

## 2021-09-04 ENCOUNTER — Emergency Department (HOSPITAL_COMMUNITY): Payer: Medicare HMO

## 2021-09-04 ENCOUNTER — Encounter (HOSPITAL_COMMUNITY)
Admission: EM | Disposition: A | Discharge status: Home / Self Care | Payer: Self-pay | Source: Home / Self Care | Attending: Cardiology

## 2021-09-04 DIAGNOSIS — Z951 Presence of aortocoronary bypass graft: Secondary | ICD-10-CM | POA: Diagnosis not present

## 2021-09-04 DIAGNOSIS — N1831 Chronic kidney disease, stage 3a: Secondary | ICD-10-CM | POA: Diagnosis not present

## 2021-09-04 DIAGNOSIS — Z96662 Presence of left artificial ankle joint: Secondary | ICD-10-CM | POA: Diagnosis present

## 2021-09-04 DIAGNOSIS — Z955 Presence of coronary angioplasty implant and graft: Secondary | ICD-10-CM

## 2021-09-04 DIAGNOSIS — E669 Obesity, unspecified: Secondary | ICD-10-CM | POA: Diagnosis present

## 2021-09-04 DIAGNOSIS — I5043 Acute on chronic combined systolic (congestive) and diastolic (congestive) heart failure: Secondary | ICD-10-CM | POA: Diagnosis not present

## 2021-09-04 DIAGNOSIS — I25810 Atherosclerosis of coronary artery bypass graft(s) without angina pectoris: Secondary | ICD-10-CM | POA: Diagnosis not present

## 2021-09-04 DIAGNOSIS — Z7984 Long term (current) use of oral hypoglycemic drugs: Secondary | ICD-10-CM

## 2021-09-04 DIAGNOSIS — I951 Orthostatic hypotension: Secondary | ICD-10-CM | POA: Diagnosis present

## 2021-09-04 DIAGNOSIS — I10 Essential (primary) hypertension: Secondary | ICD-10-CM | POA: Diagnosis not present

## 2021-09-04 DIAGNOSIS — R231 Pallor: Secondary | ICD-10-CM | POA: Diagnosis not present

## 2021-09-04 DIAGNOSIS — Z87891 Personal history of nicotine dependence: Secondary | ICD-10-CM

## 2021-09-04 DIAGNOSIS — I252 Old myocardial infarction: Secondary | ICD-10-CM | POA: Diagnosis not present

## 2021-09-04 DIAGNOSIS — Z6835 Body mass index (BMI) 35.0-35.9, adult: Secondary | ICD-10-CM

## 2021-09-04 DIAGNOSIS — M109 Gout, unspecified: Secondary | ICD-10-CM | POA: Diagnosis present

## 2021-09-04 DIAGNOSIS — Z888 Allergy status to other drugs, medicaments and biological substances status: Secondary | ICD-10-CM

## 2021-09-04 DIAGNOSIS — Z7982 Long term (current) use of aspirin: Secondary | ICD-10-CM | POA: Diagnosis not present

## 2021-09-04 DIAGNOSIS — I213 ST elevation (STEMI) myocardial infarction of unspecified site: Secondary | ICD-10-CM | POA: Diagnosis not present

## 2021-09-04 DIAGNOSIS — K219 Gastro-esophageal reflux disease without esophagitis: Secondary | ICD-10-CM | POA: Diagnosis present

## 2021-09-04 DIAGNOSIS — I255 Ischemic cardiomyopathy: Secondary | ICD-10-CM | POA: Diagnosis not present

## 2021-09-04 DIAGNOSIS — Z20822 Contact with and (suspected) exposure to covid-19: Secondary | ICD-10-CM | POA: Diagnosis present

## 2021-09-04 DIAGNOSIS — I130 Hypertensive heart and chronic kidney disease with heart failure and stage 1 through stage 4 chronic kidney disease, or unspecified chronic kidney disease: Secondary | ICD-10-CM | POA: Diagnosis not present

## 2021-09-04 DIAGNOSIS — E1122 Type 2 diabetes mellitus with diabetic chronic kidney disease: Secondary | ICD-10-CM | POA: Diagnosis not present

## 2021-09-04 DIAGNOSIS — I214 Non-ST elevation (NSTEMI) myocardial infarction: Secondary | ICD-10-CM | POA: Diagnosis not present

## 2021-09-04 DIAGNOSIS — N529 Male erectile dysfunction, unspecified: Secondary | ICD-10-CM | POA: Diagnosis present

## 2021-09-04 DIAGNOSIS — I25110 Atherosclerotic heart disease of native coronary artery with unstable angina pectoris: Secondary | ICD-10-CM | POA: Diagnosis present

## 2021-09-04 DIAGNOSIS — E7800 Pure hypercholesterolemia, unspecified: Secondary | ICD-10-CM | POA: Diagnosis present

## 2021-09-04 DIAGNOSIS — Z8249 Family history of ischemic heart disease and other diseases of the circulatory system: Secondary | ICD-10-CM

## 2021-09-04 DIAGNOSIS — R079 Chest pain, unspecified: Secondary | ICD-10-CM | POA: Diagnosis not present

## 2021-09-04 DIAGNOSIS — R000 Tachycardia, unspecified: Secondary | ICD-10-CM | POA: Diagnosis not present

## 2021-09-04 DIAGNOSIS — I2510 Atherosclerotic heart disease of native coronary artery without angina pectoris: Secondary | ICD-10-CM | POA: Diagnosis not present

## 2021-09-04 DIAGNOSIS — Z8673 Personal history of transient ischemic attack (TIA), and cerebral infarction without residual deficits: Secondary | ICD-10-CM | POA: Diagnosis not present

## 2021-09-04 DIAGNOSIS — Z79899 Other long term (current) drug therapy: Secondary | ICD-10-CM

## 2021-09-04 DIAGNOSIS — I517 Cardiomegaly: Secondary | ICD-10-CM | POA: Diagnosis not present

## 2021-09-04 DIAGNOSIS — R0789 Other chest pain: Secondary | ICD-10-CM | POA: Diagnosis not present

## 2021-09-04 DIAGNOSIS — E782 Mixed hyperlipidemia: Secondary | ICD-10-CM | POA: Diagnosis not present

## 2021-09-04 DIAGNOSIS — I25700 Atherosclerosis of coronary artery bypass graft(s), unspecified, with unstable angina pectoris: Secondary | ICD-10-CM | POA: Diagnosis not present

## 2021-09-04 DIAGNOSIS — I249 Acute ischemic heart disease, unspecified: Principal | ICD-10-CM

## 2021-09-04 DIAGNOSIS — R0602 Shortness of breath: Secondary | ICD-10-CM | POA: Diagnosis not present

## 2021-09-04 DIAGNOSIS — I5042 Chronic combined systolic (congestive) and diastolic (congestive) heart failure: Secondary | ICD-10-CM | POA: Diagnosis not present

## 2021-09-04 HISTORY — PX: CORONARY STENT INTERVENTION: CATH118234

## 2021-09-04 HISTORY — PX: LEFT HEART CATH AND CORS/GRAFTS ANGIOGRAPHY: CATH118250

## 2021-09-04 LAB — CBC
HCT: 34.8 % — ABNORMAL LOW (ref 39.0–52.0)
Hemoglobin: 10.9 g/dL — ABNORMAL LOW (ref 13.0–17.0)
MCH: 25.3 pg — ABNORMAL LOW (ref 26.0–34.0)
MCHC: 31.3 g/dL (ref 30.0–36.0)
MCV: 80.7 fL (ref 80.0–100.0)
Platelets: 271 10*3/uL (ref 150–400)
RBC: 4.31 MIL/uL (ref 4.22–5.81)
RDW: 16.3 % — ABNORMAL HIGH (ref 11.5–15.5)
WBC: 8.6 10*3/uL (ref 4.0–10.5)
nRBC: 0 % (ref 0.0–0.2)

## 2021-09-04 LAB — COMPREHENSIVE METABOLIC PANEL
ALT: 15 U/L (ref 0–44)
AST: 24 U/L (ref 15–41)
Albumin: 3.4 g/dL — ABNORMAL LOW (ref 3.5–5.0)
Alkaline Phosphatase: 35 U/L — ABNORMAL LOW (ref 38–126)
Anion gap: 7 (ref 5–15)
BUN: 17 mg/dL (ref 8–23)
CO2: 23 mmol/L (ref 22–32)
Calcium: 9.1 mg/dL (ref 8.9–10.3)
Chloride: 108 mmol/L (ref 98–111)
Creatinine, Ser: 1.35 mg/dL — ABNORMAL HIGH (ref 0.61–1.24)
GFR, Estimated: 54 mL/min — ABNORMAL LOW (ref >60)
Glucose, Bld: 103 mg/dL — ABNORMAL HIGH (ref 70–99)
Potassium: 4.3 mmol/L (ref 3.5–5.1)
Sodium: 138 mmol/L (ref 135–145)
Total Bilirubin: 0.5 mg/dL (ref 0.3–1.2)
Total Protein: 7.1 g/dL (ref 6.5–8.1)

## 2021-09-04 LAB — I-STAT CHEM 8, ED
BUN: 17 mg/dL (ref 8–23)
Calcium, Ion: 1.08 mmol/L — ABNORMAL LOW (ref 1.15–1.40)
Chloride: 108 mmol/L (ref 98–111)
Creatinine, Ser: 1.3 mg/dL — ABNORMAL HIGH (ref 0.61–1.24)
Glucose, Bld: 97 mg/dL (ref 70–99)
HCT: 34 % — ABNORMAL LOW (ref 39.0–52.0)
Hemoglobin: 11.6 g/dL — ABNORMAL LOW (ref 13.0–17.0)
Potassium: 4.4 mmol/L (ref 3.5–5.1)
Sodium: 138 mmol/L (ref 135–145)
TCO2: 22 mmol/L (ref 22–32)

## 2021-09-04 LAB — POCT ACTIVATED CLOTTING TIME: Activated Clotting Time: 269 seconds

## 2021-09-04 LAB — PROTIME-INR
INR: 1 (ref 0.8–1.2)
Prothrombin Time: 13.5 seconds (ref 11.4–15.2)

## 2021-09-04 LAB — CBG MONITORING, ED: Glucose-Capillary: 95 mg/dL (ref 70–99)

## 2021-09-04 LAB — TROPONIN I (HIGH SENSITIVITY)
Troponin I (High Sensitivity): 23 ng/L — ABNORMAL HIGH (ref <18)
Troponin I (High Sensitivity): 350 ng/L (ref <18)

## 2021-09-04 LAB — RESP PANEL BY RT-PCR (FLU A&B, COVID) ARPGX2
Influenza A by PCR: NEGATIVE
Influenza B by PCR: NEGATIVE
SARS Coronavirus 2 by RT PCR: NEGATIVE

## 2021-09-04 LAB — GLUCOSE, CAPILLARY
Glucose-Capillary: 97 mg/dL (ref 70–99)
Glucose-Capillary: 150 mg/dL — ABNORMAL HIGH (ref 70–99)

## 2021-09-04 LAB — BRAIN NATRIURETIC PEPTIDE: B Natriuretic Peptide: 190.3 pg/mL — ABNORMAL HIGH (ref 0.0–100.0)

## 2021-09-04 SURGERY — LEFT HEART CATH AND CORS/GRAFTS ANGIOGRAPHY
Anesthesia: LOCAL

## 2021-09-04 MED ORDER — CLOPIDOGREL BISULFATE 75 MG PO TABS
75.0000 mg | ORAL_TABLET | Freq: Every day | ORAL | Status: DC
  Administered 2021-09-05: 75 mg via ORAL
  Filled 2021-09-04: qty 1

## 2021-09-04 MED ORDER — NITROGLYCERIN IN D5W 200-5 MCG/ML-% IV SOLN
0.0000 ug/min | INTRAVENOUS | Status: DC
  Administered 2021-09-04: 10 ug/min via INTRAVENOUS
  Filled 2021-09-04: qty 250

## 2021-09-04 MED ORDER — NITROGLYCERIN 0.4 MG SL SUBL
0.4000 mg | SUBLINGUAL_TABLET | SUBLINGUAL | Status: DC | PRN
  Administered 2021-09-04 – 2021-09-05 (×6): 0.4 mg via SUBLINGUAL
  Filled 2021-09-04 (×3): qty 1

## 2021-09-04 MED ORDER — FENTANYL CITRATE PF 50 MCG/ML IJ SOSY
PREFILLED_SYRINGE | INTRAMUSCULAR | Status: AC
  Administered 2021-09-04: 50 ug
  Filled 2021-09-04: qty 1

## 2021-09-04 MED ORDER — FENTANYL CITRATE PF 50 MCG/ML IJ SOSY: 50.0000 ug | PREFILLED_SYRINGE | Freq: Once | INTRAMUSCULAR | Status: AC

## 2021-09-04 MED ORDER — ACETAMINOPHEN 325 MG PO TABS
650.0000 mg | ORAL_TABLET | ORAL | Status: DC | PRN
  Administered 2021-09-04: 650 mg via ORAL
  Filled 2021-09-04: qty 2

## 2021-09-04 MED ORDER — TIROFIBAN (AGGRASTAT) BOLUS VIA INFUSION
25.0000 ug/kg | Freq: Once | INTRAVENOUS | Status: AC
  Administered 2021-09-04: 2642.5 ug via INTRAVENOUS
  Filled 2021-09-04: qty 53

## 2021-09-04 MED ORDER — FAMOTIDINE IN NACL 20-0.9 MG/50ML-% IV SOLN
INTRAVENOUS | Status: AC
Start: 2021-09-04 — End: ?
  Filled 2021-09-04: qty 50

## 2021-09-04 MED ORDER — HEPARIN SODIUM (PORCINE) 1000 UNIT/ML IJ SOLN
INTRAMUSCULAR | Status: AC
  Filled 2021-09-04: qty 10

## 2021-09-04 MED ORDER — NITROGLYCERIN 2 % TD OINT
0.5000 [in_us] | TOPICAL_OINTMENT | Freq: Once | TRANSDERMAL | Status: AC
  Administered 2021-09-04: 0.5 [in_us] via TOPICAL
  Filled 2021-09-04: qty 1

## 2021-09-04 MED ORDER — NITROGLYCERIN 1 MG/10 ML FOR IR/CATH LAB
INTRA_ARTERIAL | Status: DC | PRN
  Administered 2021-09-04: 200 ug via INTRACORONARY

## 2021-09-04 MED ORDER — IRBESARTAN 75 MG PO TABS
75.0000 mg | ORAL_TABLET | Freq: Every day | ORAL | Status: DC
  Administered 2021-09-04 – 2021-09-05 (×2): 75 mg via ORAL
  Filled 2021-09-04 (×2): qty 1

## 2021-09-04 MED ORDER — ASPIRIN EC 81 MG PO TBEC
81.0000 mg | DELAYED_RELEASE_TABLET | Freq: Every day | ORAL | Status: DC
  Administered 2021-09-05: 81 mg via ORAL
  Filled 2021-09-04: qty 1

## 2021-09-04 MED ORDER — FENTANYL CITRATE (PF) 100 MCG/2ML IJ SOLN
INTRAMUSCULAR | Status: AC
  Filled 2021-09-04: qty 2

## 2021-09-04 MED ORDER — VERAPAMIL HCL 2.5 MG/ML IV SOLN
INTRAVENOUS | Status: DC | PRN
  Administered 2021-09-04: 5 mL via INTRA_ARTERIAL

## 2021-09-04 MED ORDER — TIROFIBAN HCL IV 12.5 MG/250 ML
0.0750 ug/kg/min | INTRAVENOUS | Status: DC
  Administered 2021-09-04: 0.075 ug/kg/min via INTRAVENOUS
  Filled 2021-09-04: qty 250

## 2021-09-04 MED ORDER — FAMOTIDINE IN NACL 20-0.9 MG/50ML-% IV SOLN
INTRAVENOUS | Status: AC | PRN
  Administered 2021-09-04: 20 mg via INTRAVENOUS

## 2021-09-04 MED ORDER — NITROGLYCERIN 1 MG/10 ML FOR IR/CATH LAB
INTRA_ARTERIAL | Status: AC
  Filled 2021-09-04: qty 10

## 2021-09-04 MED ORDER — VERAPAMIL HCL 2.5 MG/ML IV SOLN
INTRAVENOUS | Status: AC
  Filled 2021-09-04: qty 2

## 2021-09-04 MED ORDER — HEPARIN (PORCINE) IN NACL 1000-0.9 UT/500ML-% IV SOLN
INTRAVENOUS | Status: DC | PRN
  Administered 2021-09-04 (×2): 500 mL

## 2021-09-04 MED ORDER — FENTANYL CITRATE (PF) 100 MCG/2ML IJ SOLN
INTRAMUSCULAR | Status: DC | PRN
  Administered 2021-09-04: 25 ug via INTRAVENOUS

## 2021-09-04 MED ORDER — SODIUM CHLORIDE 0.9% FLUSH
3.0000 mL | Freq: Two times a day (BID) | INTRAVENOUS | Status: DC
  Administered 2021-09-04: 3 mL via INTRAVENOUS

## 2021-09-04 MED ORDER — PANTOPRAZOLE SODIUM 40 MG PO TBEC
40.0000 mg | DELAYED_RELEASE_TABLET | Freq: Every day | ORAL | Status: DC
  Administered 2021-09-04 – 2021-09-05 (×2): 40 mg via ORAL
  Filled 2021-09-04 (×2): qty 1

## 2021-09-04 MED ORDER — LIDOCAINE HCL (PF) 1 % IJ SOLN
INTRAMUSCULAR | Status: DC | PRN
  Administered 2021-09-04: 2 mL

## 2021-09-04 MED ORDER — HEPARIN SODIUM (PORCINE) 5000 UNIT/ML IJ SOLN
4000.0000 [IU] | Freq: Once | INTRAMUSCULAR | Status: AC
  Administered 2021-09-04: 4000 [IU] via INTRAVENOUS

## 2021-09-04 MED ORDER — HEPARIN (PORCINE) IN NACL 1000-0.9 UT/500ML-% IV SOLN
INTRAVENOUS | Status: AC
  Filled 2021-09-04: qty 1000

## 2021-09-04 MED ORDER — SODIUM CHLORIDE 0.9 % WEIGHT BASED INFUSION: 1.0000 mL/kg/h | INTRAVENOUS | Status: DC

## 2021-09-04 MED ORDER — ROSUVASTATIN CALCIUM 20 MG PO TABS
40.0000 mg | ORAL_TABLET | Freq: Every day | ORAL | Status: DC
  Administered 2021-09-05: 40 mg via ORAL
  Filled 2021-09-04: qty 2

## 2021-09-04 MED ORDER — IOHEXOL 350 MG/ML SOLN
INTRAVENOUS | Status: DC | PRN
  Administered 2021-09-04: 10 mL

## 2021-09-04 MED ORDER — LIDOCAINE HCL (PF) 1 % IJ SOLN
INTRAMUSCULAR | Status: AC
  Filled 2021-09-04: qty 30

## 2021-09-04 MED ORDER — SODIUM CHLORIDE 0.9 % IV SOLN: INTRAVENOUS | Status: AC

## 2021-09-04 MED ORDER — SODIUM CHLORIDE 0.9 % IV SOLN: 250.0000 mL | INTRAVENOUS | Status: DC | PRN

## 2021-09-04 MED ORDER — METOPROLOL SUCCINATE ER 25 MG PO TB24: 25.0000 mg | ORAL_TABLET | Freq: Every day | ORAL | Status: DC

## 2021-09-04 MED ORDER — ASPIRIN 81 MG PO CHEW: 81.0000 mg | CHEWABLE_TABLET | ORAL | Status: DC

## 2021-09-04 MED ORDER — HEPARIN (PORCINE) 25000 UT/250ML-% IV SOLN
1100.0000 [IU]/h | INTRAVENOUS | Status: DC
  Administered 2021-09-04: 1100 [IU]/h via INTRAVENOUS
  Filled 2021-09-04: qty 250

## 2021-09-04 MED ORDER — MIDAZOLAM HCL 2 MG/2ML IJ SOLN
INTRAMUSCULAR | Status: AC
  Filled 2021-09-04: qty 2

## 2021-09-04 MED ORDER — MIDAZOLAM HCL 2 MG/2ML IJ SOLN
INTRAMUSCULAR | Status: DC | PRN
Start: 2021-09-04 — End: 2021-09-04
  Administered 2021-09-04: 1 mg via INTRAVENOUS

## 2021-09-04 MED ORDER — HEPARIN SODIUM (PORCINE) 1000 UNIT/ML IJ SOLN
INTRAMUSCULAR | Status: DC | PRN
  Administered 2021-09-04: 5000 [IU] via INTRAVENOUS
  Administered 2021-09-04: 3000 [IU] via INTRAVENOUS
  Administered 2021-09-04: 2000 [IU] via INTRAVENOUS

## 2021-09-04 MED ORDER — SODIUM CHLORIDE 0.9% FLUSH: 3.0000 mL | INTRAVENOUS | Status: DC | PRN

## 2021-09-04 MED ORDER — SODIUM CHLORIDE 0.9 % WEIGHT BASED INFUSION
3.0000 mL/kg/h | INTRAVENOUS | Status: DC
  Administered 2021-09-04: 3 mL/kg/h via INTRAVENOUS

## 2021-09-04 MED ORDER — ONDANSETRON HCL 4 MG/2ML IJ SOLN: 4.0000 mg | Freq: Four times a day (QID) | INTRAMUSCULAR | Status: DC | PRN

## 2021-09-04 MED ORDER — ASPIRIN 81 MG PO CHEW
81.0000 mg | CHEWABLE_TABLET | ORAL | Status: DC
Start: 2021-09-04 — End: 2021-09-04

## 2021-09-04 MED ORDER — SODIUM CHLORIDE 0.9 % WEIGHT BASED INFUSION: 3.0000 mL/kg/h | INTRAVENOUS | Status: DC

## 2021-09-04 MED ORDER — CLOPIDOGREL BISULFATE 300 MG PO TABS
ORAL_TABLET | ORAL | Status: DC | PRN
  Administered 2021-09-04: 600 mg via ORAL

## 2021-09-04 SURGICAL SUPPLY — 15 items
CATH EXTRAC PRONTO 5.5F 138CM (CATHETERS) ×1 IMPLANT
CATH INFINITI 5FR MULTPACK ANG (CATHETERS) ×1 IMPLANT
CATH VISTA GUIDE 6FR AR2 (CATHETERS) ×1 IMPLANT
DEVICE RAD COMP TR BAND LRG (VASCULAR PRODUCTS) ×1 IMPLANT
DEVICE SPIDERFX EMB PROT 5MM (WIRE) ×1 IMPLANT
ELECT DEFIB PAD ADLT CADENCE (PAD) ×1 IMPLANT
GLIDESHEATH SLEND SS 6F .021 (SHEATH) ×1 IMPLANT
GUIDEWIRE INQWIRE 1.5J.035X260 (WIRE) IMPLANT
INQWIRE 1.5J .035X260CM (WIRE) ×2
KIT ENCORE 26 ADVANTAGE (KITS) ×1 IMPLANT
KIT HEART LEFT (KITS) ×2 IMPLANT
PACK CARDIAC CATHETERIZATION (CUSTOM PROCEDURE TRAY) ×2 IMPLANT
STENT ONYX FRONTIER 4.0X12 (Permanent Stent) ×1 IMPLANT
TRANSDUCER W/STOPCOCK (MISCELLANEOUS) ×2 IMPLANT
TUBING CIL FLEX 10 FLL-RA (TUBING) ×2 IMPLANT

## 2021-09-04 NOTE — ED Notes (Signed)
Pharm tech at [facility] ?

## 2021-09-04 NOTE — ED Triage Notes (Signed)
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 NSL x2 present. Pain free on arrival. Grunting but denies sob or pain. VSS. NS 700cc in PTA. ?

## 2021-09-04 NOTE — ED Notes (Signed)
Cath lab here for pt, report given and care transferred to cath lab team in ED.  ?

## 2021-09-04 NOTE — Interval H&P Note (Signed)
History and Physical Interval Note: ? ?09/04/2021 ?3:27 PM ? ?Chris [name].  has presented today for surgery, with the diagnosis of chest pain.  The various methods of treatment have been discussed with the patient and family. After consideration of risks, benefits and other options for treatment, the patient has consented to  Procedure(s): ?LEFT HEART CATH AND CORS/GRAFTS ANGIOGRAPHY (N/A) and possible angioplasty as a surgical intervention.  The patient's history has been reviewed, patient examined, no change in status, stable for surgery.  I have reviewed the patient's chart and labs.  Questions were answered to the patient's satisfaction.   ?Cath Lab Visit (complete for each Cath Lab visit) ? ?Clinical Evaluation Leading to the Procedure:  ? ?ACS: Yes.   ? ?Non-ACS:   ? ?Anginal Classification: CCS IV ? ?Anti-ischemic medical therapy: No Therapy ? ?Non-Invasive Test Results: No non-invasive testing performed ? ?Prior CABG: Previous CABG ? ? ?Chris [name] ? ? ?

## 2021-09-04 NOTE — ED Notes (Signed)
Cards MD at [facility] ?

## 2021-09-04 NOTE — Consult Note (Signed)
CARDIOLOGY History and Physical.  ?Patient ID: ?Chris [name] Reasons. ?MRN: [id number] ?DOB/AGE: 1940/09/17 81 y.o. ? ?Admit date: 09/04/2021 ?Attending physician: Charlesetta Shanks, MD ?Primary Physician:  Susy Frizzle, MD ?Outpatient Cardiologist: Dr. Adrian Prows ?Inpatient Cardiologist: Rex Kras, DO, [facility] ? ?Reason of consultation: Chest Pain ?Referring physician: Charlesetta Shanks, MD ? ?Chief complaint: chest pain.  ? ?HPI:  ?Chris [name]. is a 81 y.o. Caucasian male who presents with a chief complaint of " chest pain." His past medical history and cardiovascular risk factors include: Hypertension, hyperlipidemia, diabetes mellitus, CAD, history of MI with status post CABG with multiple coronary interventions thereafter, history of stroke in the past without residual deficits, chronic systolic and diastolic heart failure, asymptomatic carotid artery stenosis, pulmonary fibrosis, advanced age. ? ?Patient was working in his yard yesterday and started having exertional shortness of breath and chest pain, sat down and the symptoms resolved in approximately 10 minutes.  Patient states that he took it easy over the course of yesterday and this morning started having worsening precordial chest pain with minimal activity, the intensity was initially 10 out of 10, took 3 sublingual nitroglycerin tablets without any significant improvement and called EMS and patient was brought to the ED for further evaluation and management.  Intermittently experiencing left jaw pain.  He denies any focal neurologic deficits.  Shortness of breath is chronic and stable.  Denies orthopnea, paroxysmal nocturnal dyspnea or lower extremity swelling. ? ?After sublingual nitroglycerin as well as Nitropatch his symptoms are 2 out of 10 at the time of the encounter. ? ?ALLERGIES: ?Allergies  ?Allergen Reactions  ? Brilinta [Ticagrelor] Shortness Of Breath  ? Alpha-Gal Hives  ? Crestor [Rosuvastatin Calcium] Other (See Comments)  ?  Muscle pain-  tolerating this 2022, however  ? Lipitor [Atorvastatin] Other (See Comments)  ?  Intolerable muscle pain all over   ? ? ?PAST MEDICAL HISTORY: ?Past Medical History:  ?Diagnosis Date  ? Abnormal exercise myocardial perfusion study   ? 03/2018- 38% ef, see report  ? Arthritis   ? Chronic kidney disease   ? Congestive heart failure (CHF) ([place]) 09/2015  ? EF 35-40% [name]  ? Coronary artery disease   ? a. 1995 s/p CABG x 3 (VG->RCA, LIMA->LAD, VG->OM);  b. 07/2008 Inf MI/Cath/PCI: VG->RCA 99 - treated with Taxus DES (67m), LIMA and VG->OM patent, LAD 100, LCX 100, RCA 60d, EF 50%;  c. 09/2008 PCI native RCA  w/ 2.5x23 Xience DES, VG->RCA stent patent;  c. 05/2010 Cath: Native 3VD with 3/3 patent grafts, native RCA w 80% ISR prox to graft insertion-->Med Rx.  ? DOE (dyspnea on exertion)   ? ED (erectile dysfunction)   ? GERD (gastroesophageal reflux disease)   ? occ  ? Gout   ? H/O hiatal hernia   ? Hyperlipidemia   ? Hypertension   ? Myocardial infarction ([facility]   ? Neuropathy   ? toes  ? Obesity   ? RBBB   ? RBBB (right bundle branch block)   ? Stenosis of right internal carotid artery   ? 50-69% (2014)  ? [place]   ? Umbilical hernia   ? a. s/p repair.  ? Vertigo   ? ? ?PAST SURGICAL HISTORY: ?Past Surgical History:  ?Procedure Laterality Date  ? ANKLE ARTHROSCOPY WITH DRILLING/MICROFRACTURE Left 04/13/2013  ? Procedure: LEFT ANKLE ARTHROSCOPY WITH EXTENSIVE DEBRIEDMENT;  Surgeon: JWylene Simmer MD;  [place] [facility]  Service: Orthopedics;  Laterality:  Left;  ? ANKLE SURGERY Left 08/18/2013  ? DR HEWITT  ? CARDIAC CATHETERIZATION  2000  ? stents x3  ? CARDIAC CATHETERIZATION  02/15/2014  ? Procedure: LEFT HEART CATH AND CORS/GRAFTS ANGIOGRAPHY;  Surgeon: Laverda Page, MD;  Location: [facility] CATH LAB;  Service: Cardiovascular;;  ? COLONOSCOPY    ? CORONARY ARTERY BYPASS GRAFT  1995  ? LIMA to LAD, SVG to RCA, SVG to OM.   ? CORONARY STENT INTERVENTION N/A 08/12/2020  ?  Procedure: CORONARY STENT INTERVENTION;  Surgeon: Nigel Mormon, MD;  Location: [place] CV LAB;  Service: Cardiovascular;  Laterality: N/A;  ? LEFT HEART CATH AND CORS/GRAFTS ANGIOGRAPHY N/A 08/12/2020  ? Procedure: LEFT HEART CATH AND CORS/GRAFTS ANGIOGRAPHY;  Surgeon: Nigel Mormon, MD;  Location: [place] CV LAB;  Service: Cardiovascular;  Laterality: N/A;  ? TOTAL ANKLE ARTHROPLASTY Left 08/17/2013  ? Procedure: LEFT TOTAL ANKLE REPLACEMENT WITH POSSIBLE GASTROC RECESSION ;  Surgeon: Wylene Simmer, MD;  Location: [place];  Service: Orthopedics;  Laterality: Left;  ? UMBILICAL HERNIA REPAIR  12/2007  ? ? ?FAMILY HISTORY: ?The patient's family history includes Aneurysm in his mother; Heart attack in his father; Heart disease in his father; Stroke in his brother. ?  ?SOCIAL HISTORY:  ?The patient  reports that he quit smoking about 50 years ago. His smoking use included cigarettes. He has a 9.00 pack-year smoking history. He has quit using smokeless tobacco.  His smokeless tobacco use included chew. He reports that he does not currently use alcohol. He reports that he does not use drugs. ? ?MEDICATIONS: ?Current Outpatient Medications  ?Medication Instructions  ? acetaminophen (TYLENOL) 500 mg, Oral, Every 6 hours PRN  ? alum & mag hydroxide-simeth (MYLANTA MAXIMUM STRENGTH) 400-400-40 MG/5ML suspension 10 mLs, Oral, Every 6 hours PRN  ? aspirin EC 81 mg, Oral, Daily  ? EPINEPHrine (EPI-PEN) 0.3 mg, Intramuscular, As needed  ? famotidine (PEPCID) 20 mg, Oral, 2 times daily  ? famotidine (PEPCID) 20 mg, Oral, Every evening  ? metFORMIN (GLUCOPHAGE-XR) 500 MG 24 hr tablet TAKE 2 TABLETS EVERY DAY WITH BREAKFAST  ? nitroGLYCERIN (NITROSTAT) 0.4 mg, Sublingual, Every 5 min PRN  ? pantoprazole (PROTONIX) 40 mg, Oral, Daily  ? pantoprazole (PROTONIX) 40 mg, Oral, Daily  ? rosuvastatin (CRESTOR) 40 MG tablet TAKE 1 TABLET EVERY DAY  ? triamcinolone (KENALOG) 0.025 % ointment 1 application., Topical, 2 times  daily PRN  ? valsartan (DIOVAN) 40 mg, Oral, Daily  ? ? ?REVIEW OF SYSTEMS: ?Review of Systems  ?Constitutional: Negative for chills and fever.  ?HENT:  Negative for hoarse voice and nosebleeds.   ?Eyes:  Negative for discharge, double vision and pain.  ?Cardiovascular:  Positive for chest pain and dyspnea on exertion. Negative for claudication, leg swelling, near-syncope, orthopnea, palpitations, paroxysmal nocturnal dyspnea and syncope.  ?     Jaw pain  ?Respiratory:  Positive for shortness of breath. Negative for hemoptysis.   ?Musculoskeletal:  Negative for muscle cramps and myalgias.  ?Gastrointestinal:  Negative for abdominal pain, constipation, diarrhea, hematemesis, hematochezia, melena, nausea and vomiting.  ?Neurological:  Positive for dizziness and light-headedness.  ?All other systems reviewed and are negative. ? ?PHYSICAL EXAM: ? ?  09/04/2021  ?  1:30 PM 09/04/2021  ?  1:15 PM 09/04/2021  ?  1:00 PM  ?Vitals with BMI  ?Systolic 175 102 585  ?Diastolic 277 87 72  ?Pulse 55 63 48  ? ? ? ?Intake/Output Summary (Last 24 hours) at 09/04/2021 1354 ?Last  data filed at 09/04/2021 1236 ?Gross per 24 hour  ?Intake 700 ml  ?Output --  ?Net 700 ml  ?  ?Net IO Since Admission: 700 mL [09/04/21 1354] ? ?CONSTITUTIONAL: Age-appropriate, hemodynamically stable, no acute distress.  ?SKIN: Skin is warm and dry. No rash noted. No cyanosis. No pallor. No jaundice ?HEAD: Normocephalic and atraumatic.  ?EYES: No scleral icterus ?MOUTH/THROAT: Moist oral membranes.  ?NECK: No JVD present. No thyromegaly noted. No carotid bruits  ?CHEST Normal respiratory effort. No intercostal retractions  ?LUNGS: Clear to auscultation in the upper lung fields with dry crackles noted at bilateral bases right greater than left. ?CARDIOVASCULAR: Regular, bradycardic, positive S1-S2, no murmurs rubs or gallops appreciated. ?ABDOMINAL: soft, nontender, nondistended, positive bowel sounds in all 4 quadrants, no apparent ascites.  ?EXTREMITIES: No  pitting edema, warm to touch, endovascular vein graft site is well-healed on the right lower extremity, 2+ bilateral DP pulses.  Palpable bilateral PT pulses. ?HEMATOLOGIC: No significant bruising ?NEUROLOGI

## 2021-09-04 NOTE — ED Provider Notes (Signed)
?[place] ?Provider Note ? ? ?CSN: [id number] ?Arrival date & time: 09/04/21  1219 ? ?  ? ?History ? ?Chief Complaint  ?Patient presents with  ? Chest Pain  ? ? ?Chris [name]. is a 81 y.o. male who presents with near syncope and sob. He has a pmh of hypertension, hyperlipidemia, diabetes mellitus diagnosed in 2018, stroke without residual deficits, carotid artery stenosis, combined systolic and diastolic heart failure wit EF of 35 to 40% with last echocardiogram being 11/06/2020. Patient also has a past medical history of coronary artery bypass graft with both native and graft arterial disease status post stenting in March 2022 and multiple previous coronary interventions.  Patient states that he was doing some yard work yesterday and  while he was drilling in  some screws he had onset of shortness of breath and felt like "my legs were going to come out from under me."  He states that he was very close to his riding lawnmower and walked over to the lawnmower and sat on the seat for approximately 10 minutes.  He states that his symptoms resolved he felt much better and he got up to go do more work.  He states that he got "about 2 screws in" when he had onset of the exact same symptoms.  He went inside his home and laid down with resolution of his symptoms and did not do anything for the rest of the day.  Today the patient states that he got up to eat breakfast and had some Cheerios.  He went outside to sit on his porch swing when he had onset of severe chest pressure, shortness of breath and feeling as if he were going to pass out.  He took 3 nitroglycerin without relief of his symptoms and called 911.  When EMS around they found the patient diaphoretic and called a code STEMI based on a twelve-lead that showed V1 and V2 isolated ST segment depression with concern for posterior MI.  This was discontinued by consulting physician in the ER.  Patient states that he feels better  but is still feeling quite short of breath.  He has no other complaints at this time. ? ? ?No language interpreter was used.  ? ?HPI: A 81 year old patient with a history of CVA, peripheral artery disease, treated diabetes, hypertension, hypercholesterolemia and obesity presents for evaluation of chest pain. Initial onset of pain was approximately 1-3 hours ago. The patient's chest pain is described as heaviness/pressure/tightness and is worse with exertion. The patient reports some diaphoresis. The patient's chest pain is middle- or left-sided, is not well-localized, is not sharp and does not radiate to the arms/jaw/neck. The patient does not complain of nausea. The patient has a family history of coronary artery disease in a first-degree relative with onset less than age 41. The patient has not smoked in the past 90 days.  ? ?Home Medications ?Prior to Admission medications   ?Medication Sig Start Date End Date Taking? Authorizing Provider  ?acetaminophen (TYLENOL) 500 MG tablet Take 500 mg by mouth every 6 (six) hours as needed for mild pain.   Yes [provider]  ?aspirin EC 81 MG tablet Take 1 tablet (81 mg total) by mouth daily. 02/16/14  Yes Adrian Prows, MD  ?EPINEPHrine 0.3 mg/0.3 mL IJ SOAJ injection Inject 0.3 mg into the muscle as needed for anaphylaxis. 12/12/20  Yes Ripley Fraise, MD  ?famotidine (PEPCID) 20 MG tablet Take 20 mg by mouth every evening.  Yes [provider]  ?metFORMIN (GLUCOPHAGE-XR) 500 MG 24 hr tablet TAKE 2 TABLETS EVERY DAY WITH BREAKFAST ?Patient taking differently: 500 mg in the morning and at bedtime. 04/04/21  Yes Susy Frizzle, MD  ?nitroGLYCERIN (NITROSTAT) 0.4 MG SL tablet Place 0.4 mg under the tongue every 5 (five) minutes as needed for chest pain.   Yes [provider]  ?pantoprazole (PROTONIX) 40 MG tablet Take 40 mg by mouth daily.   Yes [provider]  ?rosuvastatin (CRESTOR) 40 MG tablet TAKE 1 TABLET EVERY DAY ?Patient taking  differently: Take 40 mg by mouth daily. 12/04/20  Yes Adrian Prows, MD  ?triamcinolone (KENALOG) 0.025 % ointment Apply 1 application topically 2 (two) times daily as needed (itching).   Yes [provider]  ?valsartan (DIOVAN) 40 MG tablet Take 1 tablet (40 mg total) by mouth daily. 05/22/21  Yes [name], [name], PA-C  ?alum & mag hydroxide-simeth (MYLANTA MAXIMUM STRENGTH) 400-400-40 MG/5ML suspension Take 10 mLs by mouth every 6 (six) hours as needed for indigestion. ?Patient not taking: Reported on 09/04/2021 05/13/21   Kommor, Debe Coder, MD  ?famotidine (PEPCID) 20 MG tablet Take 1 tablet (20 mg total) by mouth 2 (two) times daily. ?Patient not taking: Reported on 09/04/2021 05/22/21   Susy Frizzle, MD  ?pantoprazole (PROTONIX) 40 MG tablet Take 1 tablet (40 mg total) by mouth daily. 05/22/21 08/20/21  Susy Frizzle, MD  ?   ? ?Allergies    ?Brilinta [ticagrelor], Alpha-gal, Crestor [rosuvastatin calcium], and Lipitor [atorvastatin]   ? ?Review of Systems   ?Review of Systems ? ?Physical Exam ?Updated Vital Signs ?BP 94/66   Pulse (!) 49   Temp 98 ?F (36.7 ?C) (Oral)   Resp 13   Ht '5\' 8"'$  (1.727 m)   Wt 105.7 kg   SpO2 97%   BMI 35.43 kg/m?  ?Physical Exam ?Vitals and nursing note reviewed.  ?Constitutional:   ?   General: He is not in acute distress. ?   Appearance: He is well-developed. He is not diaphoretic.  ?HENT:  ?   Head: Normocephalic and atraumatic.  ?Eyes:  ?   General: No scleral icterus. ?   Conjunctiva/sclera: Conjunctivae normal.  ?Cardiovascular:  ?   Rate and Rhythm: Regular rhythm. Bradycardia present.  ?   Heart sounds: Normal heart sounds.  ?Pulmonary:  ?   Effort: Pulmonary effort is normal. No respiratory distress.  ?   Breath sounds: Normal breath sounds.  ?   Comments: Breathing appears mildly labored ?Abdominal:  ?   Palpations: Abdomen is soft.  ?   Tenderness: There is no abdominal tenderness.  ?Musculoskeletal:  ?   Cervical back: Normal range of motion and neck  supple.  ?Skin: ?   General: Skin is warm and dry.  ?Neurological:  ?   Mental Status: He is alert.  ?Psychiatric:     ?   Behavior: Behavior normal.  ? ? ? ? ? ?ED Results / Procedures / Treatments   ?Labs ?(all labs ordered are listed, but only abnormal results are displayed) ?Labs Reviewed  ?CBC - Abnormal; Notable for the following components:  ?    Result Value  ? Hemoglobin 10.9 (*)   ? HCT 34.8 (*)   ? MCH 25.3 (*)   ? RDW 16.3 (*)   ? All other components within normal limits  ?COMPREHENSIVE METABOLIC PANEL - Abnormal; Notable for the following components:  ? Glucose, Bld 103 (*)   ? Creatinine, Ser 1.35 (*)   ?  Albumin 3.4 (*)   ? Alkaline Phosphatase 35 (*)   ? GFR, Estimated 54 (*)   ? All other components within normal limits  ?BRAIN NATRIURETIC PEPTIDE - Abnormal; Notable for the following components:  ? B Natriuretic Peptide 190.3 (*)   ? All other components within normal limits  ?I-STAT CHEM 8, ED - Abnormal; Notable for the following components:  ? Creatinine, Ser 1.30 (*)   ? Calcium, Ion 1.08 (*)   ? Hemoglobin 11.6 (*)   ? HCT 34.0 (*)   ? All other components within normal limits  ?TROPONIN I (HIGH SENSITIVITY) - Abnormal; Notable for the following components:  ? Troponin I (High Sensitivity) 23 (*)   ? All other components within normal limits  ?RESP PANEL BY RT-PCR (FLU A&B, COVID) ARPGX2  ?PROTIME-INR  ?CBG MONITORING, ED  ?TROPONIN I (HIGH SENSITIVITY)  ? ? ?EKG ?EKG Interpretation ? ?Date/Time:  Thursday September 04 2021 12:28:19 EDT ?Ventricular Rate:  49 ?PR Interval:  185 ?QRS Duration: 146 ?QT Interval:  491 ?QTC Calculation: 444 ?R Axis:   95 ?Text Interpretation: Sinus bradycardia RBBB and LPFB Inferior infarct, old rate slower but otherwise no change from previous Confirmed by Charlesetta Shanks [phone]) on 09/04/2021 12:47:01 PM ? ?Radiology ?DG Chest Port 1 View ? ?Result Date: 09/04/2021 ?CLINICAL DATA:  Shortness of breath. EXAM: PORTABLE CHEST 1 VIEW COMPARISON:  May 13, 2021.  FINDINGS: Stable cardiomegaly. Sternotomy wires are noted. Mildly increased bilateral interstitial densities are noted concerning for pulmonary edema. Bony thorax is unremarkable. IMPRESSION: Mildly increased bi

## 2021-09-04 NOTE — Progress Notes (Signed)
ANTICOAGULATION CONSULT NOTE - Initial Consult ? ?Pharmacy Consult for heparin and tirofiban ?Indication: chest pain/ACS ? ?Allergies  ?Allergen Reactions  ? Brilinta [Ticagrelor] Shortness Of Breath  ? Alpha-Gal Hives  ? Crestor [Rosuvastatin Calcium] Other (See Comments)  ?  Muscle pain- tolerating this 2022, however  ? Lipitor [Atorvastatin] Other (See Comments)  ?  Intolerable muscle pain all over   ? ? ?Patient Measurements: ?Height: '5\' 8"'$  (172.7 cm) ?Weight: 105.7 kg (233 lb) ?IBW/kg (Calculated) : 68.4 ?Heparin Dosing Weight: 91.6 kg ? ?Vital Signs: ?Temp: 98 ?F (36.7 ?C) (04/13 1229) ?Temp Source: Oral (04/13 1229) ?BP: 114/70 (04/13 1345) ?Pulse Rate: 65 (04/13 1345) ? ?Labs: ?Recent Labs  ?  09/04/21 ?1230 09/04/21 ?1326  ?HGB 10.9* 11.6*  ?HCT 34.8* 34.0*  ?PLT 271  --   ?LABPROT 13.5  --   ?INR 1.0  --   ?CREATININE 1.35* 1.30*  ?TROPONINIHS 23*  --   ? ? ?Estimated Creatinine Clearance: 55.2 mL/min (A) (by C-G formula based on SCr of 1.3 mg/dL (H)). ? ? ?Medical History: ?Past Medical History:  ?Diagnosis Date  ? Abnormal exercise myocardial perfusion study   ? 03/2018- 38% ef, see report  ? Arthritis   ? Chronic kidney disease   ? Congestive heart failure (CHF) ([place]) 09/2015  ? EF 35-40% [name]  ? Coronary artery disease   ? a. 1995 s/p CABG x 3 (VG->RCA, LIMA->LAD, VG->OM);  b. 07/2008 Inf MI/Cath/PCI: VG->RCA 99 - treated with Taxus DES (69m), LIMA and VG->OM patent, LAD 100, LCX 100, RCA 60d, EF 50%;  c. 09/2008 PCI native RCA  w/ 2.5x23 Xience DES, VG->RCA stent patent;  c. 05/2010 Cath: Native 3VD with 3/3 patent grafts, native RCA w 80% ISR prox to graft insertion-->Med Rx.  ? DOE (dyspnea on exertion)   ? ED (erectile dysfunction)   ? GERD (gastroesophageal reflux disease)   ? occ  ? Gout   ? H/O hiatal hernia   ? Hyperlipidemia   ? Hypertension   ? Myocardial infarction ([facility]   ? Neuropathy   ? toes  ? Obesity   ? RBBB   ? RBBB (right bundle branch block)   ? Stenosis of right internal  carotid artery   ? 50-69% (2014)  ? [place] II [place] ([place]   ? Umbilical hernia   ? a. s/p repair.  ? Vertigo   ? ?Assessment: ?81yo M with history of MI status post CABG and multiple coronary interventions. Now presenting with chest pain, shortness of breath, and near syncope. Pharmacy consulted to start heparin and tirofiban for ACS.  ? ?H/H slightly low but stable at 11.6, platelets within normal limits. Per Dr. GEinar Gip start tirofiban as soon as possible due to patient's history of prior interventions. Patient already received heparin IV bolus of 4000 units per MD.  ? ?Goal of Therapy:  ?Heparin level 0.3-0.7 units/ml ?Monitor platelets by anticoagulation protocol: Yes ?  ?Plan:  ?Heparin 1100 units/hr  ?Tirofiban 2642.5 mcg IV bolus followed by tirofiban 0.075 mcg/kg/min ?F/u after cath ?Monitor for s/sx of bleeding ? ?Thank you for including pharmacy in the care of this patient. ? ?BZenaida Deed PharmD ?PGY1 Acute Care Pharmacy Resident  ?Phone: [phone]?09/04/2021  5:23 PM ? ?Please check [email or web address] for unit-specific pharmacy phone numbers. ? ? ? ?

## 2021-09-04 NOTE — Progress Notes (Signed)
ANTICOAGULATION CONSULT NOTE - Initial Consult ? ?Pharmacy Consult for tirofiban ?Indication: chest pain/ACS ? ?Allergies  ?Allergen Reactions  ? Brilinta [Ticagrelor] Shortness Of Breath  ? Alpha-Gal Hives  ? Crestor [Rosuvastatin Calcium] Other (See Comments)  ?  Muscle pain- tolerating this 2022, however  ? Lipitor [Atorvastatin] Other (See Comments)  ?  Intolerable muscle pain all over   ? ? ?Patient Measurements: ?Height: '5\' 8"'$  (172.7 cm) ?Weight: 105.7 kg (233 lb) ?IBW/kg (Calculated) : 68.4 ?Heparin Dosing Weight: 91.6 kg ? ?Vital Signs: ?Temp: 97.4 ?F (36.3 ?C) (04/13 1727) ?Temp Source: Oral (04/13 1727) ?BP: 111/67 (04/13 1727) ?Pulse Rate: 66 (04/13 1727) ? ?Labs: ?Recent Labs  ?  09/04/21 ?1230 09/04/21 ?1326 09/04/21 ?1433  ?HGB 10.9* 11.6*  --   ?HCT 34.8* 34.0*  --   ?PLT 271  --   --   ?LABPROT 13.5  --   --   ?INR 1.0  --   --   ?CREATININE 1.35* 1.30*  --   ?TROPONINIHS 23*  --  350*  ? ? ? ?Estimated Creatinine Clearance: 55.2 mL/min (A) (by C-G formula based on SCr of 1.3 mg/dL (H)). ? ? ?Medical History: ?Past Medical History:  ?Diagnosis Date  ? Abnormal exercise myocardial perfusion study   ? 03/2018- 38% ef, see report  ? Arthritis   ? Chronic kidney disease   ? Congestive heart failure (CHF) ([place]) 09/2015  ? EF 35-40% [name]  ? Coronary artery disease   ? a. 1995 s/p CABG x 3 (VG->RCA, LIMA->LAD, VG->OM);  b. 07/2008 Inf MI/Cath/PCI: VG->RCA 99 - treated with Taxus DES (43m), LIMA and VG->OM patent, LAD 100, LCX 100, RCA 60d, EF 50%;  c. 09/2008 PCI native RCA  w/ 2.5x23 Xience DES, VG->RCA stent patent;  c. 05/2010 Cath: Native 3VD with 3/3 patent grafts, native RCA w 80% ISR prox to graft insertion-->Med Rx.  ? DOE (dyspnea on exertion)   ? ED (erectile dysfunction)   ? GERD (gastroesophageal reflux disease)   ? occ  ? Gout   ? H/O hiatal hernia   ? Hyperlipidemia   ? Hypertension   ? [facility]ocardial infarction ([facility]   ? Neuropathy   ? toes  ? Obesity   ? RBBB   ? RBBB (right bundle  branch block)   ? Stenosis of right internal carotid artery   ? 50-69% (2014)  ? Type II diabetes mellitus ([place]   ? Umbilical hernia   ? a. s/p repair.  ? Vertigo   ? ?Assessment: ?81yo M with history of MI status post CABG and multiple coronary interventions. Now presenting with chest pain, shortness of breath, and near syncope. Pharmacy consulted to start heparin and tirofiban for ACS.  ? ?Patient underwent DES placement in cath. Heparin now stopped. Tirofiban to continue until bag is empty per Dr. GEinar Gip ? ?Goal of Therapy:  ?Heparin level 0.3-0.7 units/ml ?Monitor platelets by anticoagulation protocol: Yes ?  ?Plan:  ?Continue 0.075 mcg/kg/min post-cath until bag empty - discussed with RN ?Monitor for s/sx of bleeding ? ?Thank you for including pharmacy in the care of this patient. ? ?JCathrine Muster PharmD ?PGY2 Cardiology Pharmacy Resident ?Phone: [phone]?09/04/2021  5:49 PM ? ?Please check [email or web address] for unit-specific pharmacy phone numbers. ? ? ? ?

## 2021-09-04 NOTE — ED Provider Notes (Signed)
I provided a substantive portion of the care of this patient.  I personally performed the entirety of the medical decision making for this encounter. ? ?EKG Interpretation ? ?Date/Time:  Thursday September 04 2021 12:28:19 EDT ?Ventricular Rate:  49 ?PR Interval:  185 ?QRS Duration: 146 ?QT Interval:  491 ?QTC Calculation: 444 ?R Axis:   95 ?Text Interpretation: Sinus bradycardia RBBB and LPFB Inferior infarct, old rate slower but otherwise no change from previous Confirmed by Charlesetta Shanks [phone]) on 09/04/2021 12:47:01 PM  ? ?Patient has extensive cardiac history.  He has been having waxing and waning chest pain over 2 days with associated lightheadedness and diaphoresis.  History concerning for unstable angina.  EKGs reviewed by myself show unchanged right bundle branch block on EKG done in the ED but review of EMS EKGs shows some increased ST depression anteriorly concerning for waxing and waning ischemia.  Will empirically start heparin.  Cardiology consulted by Francee Piccolo.  Agree with plan of management. ?  ?Charlesetta Shanks, MD ?09/04/21 1338 ? ?

## 2021-09-04 NOTE — H&P (View-Only) (Signed)
CARDIOLOGY History and Physical.  ?Patient ID: ?Chris [name]. ?MRN: [id number] ?DOB/AGE: [birth year] 81 y.o. ? ?Admit date: 09/04/2021 ?Attending physician: Charlesetta Shanks, MD ?Primary Physician:  Susy Frizzle, MD ?Outpatient Cardiologist: Dr. Adrian Prows ?Inpatient Cardiologist: Rex Kras, DO, [facility] ? ?Reason of consultation: Chest Pain ?Referring physician: Charlesetta Shanks, MD ? ?Chief complaint: chest pain.  ? ?HPI:  ?Chris [name]. is a 81 y.o. Caucasian male who presents with a chief complaint of " chest pain." His past medical history and cardiovascular risk factors include: Hypertension, hyperlipidemia, diabetes mellitus, CAD, history of MI with status post CABG with multiple coronary interventions thereafter, history of stroke in the past without residual deficits, chronic systolic and diastolic heart failure, asymptomatic carotid artery stenosis, pulmonary fibrosis, advanced age. ? ?Patient was working in his yard yesterday and started having exertional shortness of breath and chest pain, sat down and the symptoms resolved in approximately 10 minutes.  Patient states that he took it easy over the course of yesterday and this morning started having worsening precordial chest pain with minimal activity, the intensity was initially 10 out of 10, took 3 sublingual nitroglycerin tablets without any significant improvement and called EMS and patient was brought to the ED for further evaluation and management.  Intermittently experiencing left jaw pain.  He denies any focal neurologic deficits.  Shortness of breath is chronic and stable.  Denies orthopnea, paroxysmal nocturnal dyspnea or lower extremity swelling. ? ?After sublingual nitroglycerin as well as Nitropatch his symptoms are 2 out of 10 at the time of the encounter. ? ?ALLERGIES: ?Allergies  ?Allergen Reactions  ? Brilinta [Ticagrelor] Shortness Of Breath  ? Alpha-Gal Hives  ? Crestor [Rosuvastatin Calcium] Other (See Comments)  ?  Muscle pain-  tolerating this 2022, however  ? Lipitor [Atorvastatin] Other (See Comments)  ?  Intolerable muscle pain all over   ? ? ?PAST MEDICAL HISTORY: ?Past Medical History:  ?Diagnosis Date  ? Abnormal exercise myocardial perfusion study   ? 03/2018- 38% ef, see report  ? Arthritis   ? Chronic kidney disease   ? Congestive heart failure (CHF) ([place]) 09/2015  ? EF 35-40% [name]  ? Coronary artery disease   ? a. 1995 s/p CABG x 3 (VG->RCA, LIMA->LAD, VG->OM);  b. 07/2008 Inf MI/Cath/PCI: VG->RCA 99 - treated with Taxus DES (80m), LIMA and VG->OM patent, LAD 100, LCX 100, RCA 60d, EF 50%;  c. 09/2008 PCI native RCA  w/ 2.5x23 Xience DES, VG->RCA stent patent;  c. 05/2010 Cath: Native 3VD with 3/3 patent grafts, native RCA w 80% ISR prox to graft insertion-->Med Rx.  ? DOE (dyspnea on exertion)   ? ED (erectile dysfunction)   ? GERD (gastroesophageal reflux disease)   ? occ  ? Gout   ? H/O hiatal hernia   ? Hyperlipidemia   ? Hypertension   ? Myocardial infarction ([facility]   ? Neuropathy   ? toes  ? Obesity   ? RBBB   ? RBBB (right bundle branch block)   ? Stenosis of right internal carotid artery   ? 50-69% (2014)  ? Type II diabetes mellitus ([place]   ? Umbilical hernia   ? a. s/p repair.  ? Vertigo   ? ? ?PAST SURGICAL HISTORY: ?Past Surgical History:  ?Procedure Laterality Date  ? ANKLE ARTHROSCOPY WITH DRILLING/MICROFRACTURE Left 04/13/2013  ? Procedure: LEFT ANKLE ARTHROSCOPY WITH EXTENSIVE DEBRIEDMENT;  Surgeon: JWylene Simmer MD;  [place]  Service: Orthopedics;  Laterality:  Left;  ? ANKLE SURGERY Left 08/18/2013  ? DR HEWITT  ? CARDIAC CATHETERIZATION  2000  ? stents x3  ? CARDIAC CATHETERIZATION  02/15/2014  ? Procedure: LEFT HEART CATH AND CORS/GRAFTS ANGIOGRAPHY;  Surgeon: Laverda Page, MD;  Location: [facility] CATH LAB;  Service: Cardiovascular;;  ? COLONOSCOPY    ? CORONARY ARTERY BYPASS GRAFT  1995  ? LIMA to LAD, SVG to RCA, SVG to OM.   ? CORONARY STENT INTERVENTION N/A 08/12/2020  ?  Procedure: CORONARY STENT INTERVENTION;  Surgeon: Nigel Mormon, MD;  Location: [place] CV LAB;  Service: Cardiovascular;  Laterality: N/A;  ? LEFT HEART CATH AND CORS/GRAFTS ANGIOGRAPHY N/A 08/12/2020  ? Procedure: LEFT HEART CATH AND CORS/GRAFTS ANGIOGRAPHY;  Surgeon: Nigel Mormon, MD;  Location: [place] CV LAB;  Service: Cardiovascular;  Laterality: N/A;  ? TOTAL ANKLE ARTHROPLASTY Left 08/17/2013  ? Procedure: LEFT TOTAL ANKLE REPLACEMENT WITH POSSIBLE GASTROC RECESSION ;  Surgeon: Wylene Simmer, MD;  Location: [place];  Service: Orthopedics;  Laterality: Left;  ? UMBILICAL HERNIA REPAIR  12/2007  ? ? ?FAMILY HISTORY: ?The patient's family history includes Aneurysm in his mother; Heart attack in his father; Heart disease in his father; Stroke in his brother. ?  ?SOCIAL HISTORY:  ?The patient  reports that he quit smoking about 50 years ago. His smoking use included cigarettes. He has a 9.00 pack-year smoking history. He has quit using smokeless tobacco.  His smokeless tobacco use included chew. He reports that he does not currently use alcohol. He reports that he does not use drugs. ? ?MEDICATIONS: ?Current Outpatient Medications  ?Medication Instructions  ? acetaminophen (TYLENOL) 500 mg, Oral, Every 6 hours PRN  ? alum & mag hydroxide-simeth (MYLANTA MAXIMUM STRENGTH) 400-400-40 MG/5ML suspension 10 mLs, Oral, Every 6 hours PRN  ? aspirin EC 81 mg, Oral, Daily  ? EPINEPHrine (EPI-PEN) 0.3 mg, Intramuscular, As needed  ? famotidine (PEPCID) 20 mg, Oral, 2 times daily  ? famotidine (PEPCID) 20 mg, Oral, Every evening  ? metFORMIN (GLUCOPHAGE-XR) 500 MG 24 hr tablet TAKE 2 TABLETS EVERY DAY WITH BREAKFAST  ? nitroGLYCERIN (NITROSTAT) 0.4 mg, Sublingual, Every 5 min PRN  ? pantoprazole (PROTONIX) 40 mg, Oral, Daily  ? pantoprazole (PROTONIX) 40 mg, Oral, Daily  ? rosuvastatin (CRESTOR) 40 MG tablet TAKE 1 TABLET EVERY DAY  ? triamcinolone (KENALOG) 0.025 % ointment 1 application., Topical, 2 times  daily PRN  ? valsartan (DIOVAN) 40 mg, Oral, Daily  ? ? ?REVIEW OF SYSTEMS: ?Review of Systems  ?Constitutional: Negative for chills and fever.  ?HENT:  Negative for hoarse voice and nosebleeds.   ?Eyes:  Negative for discharge, double vision and pain.  ?Cardiovascular:  Positive for chest pain and dyspnea on exertion. Negative for claudication, leg swelling, near-syncope, orthopnea, palpitations, paroxysmal nocturnal dyspnea and syncope.  ?     Jaw pain  ?Respiratory:  Positive for shortness of breath. Negative for hemoptysis.   ?Musculoskeletal:  Negative for muscle cramps and myalgias.  ?Gastrointestinal:  Negative for abdominal pain, constipation, diarrhea, hematemesis, hematochezia, melena, nausea and vomiting.  ?Neurological:  Positive for dizziness and light-headedness.  ?All other systems reviewed and are negative. ? ?PHYSICAL EXAM: ? ?  09/04/2021  ?  1:30 PM 09/04/2021  ?  1:15 PM 09/04/2021  ?  1:00 PM  ?Vitals with BMI  ?Systolic 937 169 678  ?Diastolic 938 87 72  ?Pulse 55 63 48  ? ? ? ?Intake/Output Summary (Last 24 hours) at 09/04/2021 1354 ?Last  data filed at 09/04/2021 1236 ?Gross per 24 hour  ?Intake 700 ml  ?Output --  ?Net 700 ml  ?  ?Net IO Since Admission: 700 mL [09/04/21 1354] ? ?CONSTITUTIONAL: Age-appropriate, hemodynamically stable, no acute distress.  ?SKIN: Skin is warm and dry. No rash noted. No cyanosis. No pallor. No jaundice ?HEAD: Normocephalic and atraumatic.  ?EYES: No scleral icterus ?MOUTH/THROAT: Moist oral membranes.  ?NECK: No JVD present. No thyromegaly noted. No carotid bruits  ?CHEST Normal respiratory effort. No intercostal retractions  ?LUNGS: Clear to auscultation in the upper lung fields with dry crackles noted at bilateral bases right greater than left. ?CARDIOVASCULAR: Regular, bradycardic, positive S1-S2, no murmurs rubs or gallops appreciated. ?ABDOMINAL: soft, nontender, nondistended, positive bowel sounds in all 4 quadrants, no apparent ascites.  ?EXTREMITIES: No  pitting edema, warm to touch, endovascular vein graft site is well-healed on the right lower extremity, 2+ bilateral DP pulses.  Palpable bilateral PT pulses. ?HEMATOLOGIC: No significant bruising ?NEUROLOGI

## 2021-09-05 ENCOUNTER — Encounter (HOSPITAL_COMMUNITY): Payer: Self-pay | Admitting: Cardiology

## 2021-09-05 ENCOUNTER — Other Ambulatory Visit (HOSPITAL_COMMUNITY): Payer: Self-pay

## 2021-09-05 LAB — CBC
HCT: 32.5 % — ABNORMAL LOW (ref 39.0–52.0)
Hemoglobin: 9.9 g/dL — ABNORMAL LOW (ref 13.0–17.0)
MCH: 24.4 pg — ABNORMAL LOW (ref 26.0–34.0)
MCHC: 30.5 g/dL (ref 30.0–36.0)
MCV: 80.2 fL (ref 80.0–100.0)
Platelets: 257 10*3/uL (ref 150–400)
RBC: 4.05 MIL/uL — ABNORMAL LOW (ref 4.22–5.81)
RDW: 16.3 % — ABNORMAL HIGH (ref 11.5–15.5)
WBC: 7.3 10*3/uL (ref 4.0–10.5)
nRBC: 0 % (ref 0.0–0.2)

## 2021-09-05 LAB — GLUCOSE, CAPILLARY
Glucose-Capillary: 154 mg/dL — ABNORMAL HIGH (ref 70–99)
Glucose-Capillary: 123 mg/dL — ABNORMAL HIGH (ref 70–99)

## 2021-09-05 MED ORDER — CARVEDILOL 3.125 MG PO TABS
3.1250 mg | ORAL_TABLET | Freq: Two times a day (BID) | ORAL | 1 refills | Status: DC
  Filled 2021-09-05: qty 60, 30d supply, fill #0

## 2021-09-05 MED ORDER — POTASSIUM CHLORIDE CRYS ER 20 MEQ PO TBCR
20.0000 meq | EXTENDED_RELEASE_TABLET | Freq: Every day | ORAL | 1 refills | Status: DC
  Filled 2021-09-05: qty 30, 30d supply, fill #0

## 2021-09-05 MED ORDER — CLOPIDOGREL BISULFATE 75 MG PO TABS
75.0000 mg | ORAL_TABLET | Freq: Every day | ORAL | 1 refills | Status: DC
  Filled 2021-09-05: qty 30, 30d supply, fill #0

## 2021-09-05 MED ORDER — FUROSEMIDE 20 MG PO TABS
20.0000 mg | ORAL_TABLET | Freq: Every day | ORAL | 11 refills | Status: DC
  Filled 2021-09-05: qty 30, 30d supply, fill #0

## 2021-09-05 MED ORDER — FUROSEMIDE 40 MG PO TABS
40.0000 mg | ORAL_TABLET | Freq: Every day | ORAL | Status: DC
  Administered 2021-09-05: 40 mg via ORAL
  Filled 2021-09-05: qty 1

## 2021-09-05 MED ORDER — FUROSEMIDE 40 MG PO TABS
40.0000 mg | ORAL_TABLET | ORAL | 1 refills | Status: DC
  Filled 2021-09-05: qty 30, 30d supply, fill #0

## 2021-09-05 NOTE — Discharge Summary (Addendum)
Physician Discharge Summary  ?Patient ID: ?Chris [name]. ?MRN: [id number] ?DOB/AGE: 07/28/1940 81 y.o. ?Susy Frizzle, MD  ? ?Admit date: 09/04/2021 ?Discharge date: 09/05/2021 ? ?Primary Discharge Diagnosis ?NSTEMI ?Coronary artery disease of the bypass graft with NSTEMI/unstable angina ?Coronary artery disease of native vessel with chronic stable angina ?Acute on chronic systolic and diastolic heart failure with severe LV systolic dysfunction, EF 30 to 35%. ?Controlled type 2 diabetes mellitus without hyperglycemia with stage IIIa chronic kidney disease ? ? ?Significant Diagnostic Studies: ? ?Left Heart Catheterization 09/04/21:  ?LV: 121/10, EDP 26 mmHg.  Ao 118/62, mean 83 mmHg.  No pressure gradient across the aortic valve. ?LM: Large vessel, mild disease. ?LAD: Occluded in the midsegment.  Distal segment supplied by LIMA to LAD which is widely patent. ?LCx: Occluded in the proximal to mid segment.  OM1 is supplied by SVG to OM which is widely patent. ?RCA: Occluded in the midsegment just proximal to previously placed stent.  SVG to RCA is patent, previously placed 3.5 x 38 mm resolute Onyx DES is widely patent in the distal RCA, proximal to mid 38 mm Taxus DES placed in May 2010 has a thrombotic 80% stenosis, focal.  There is mild luminal loss throughout the stent. ?  ?Intervention data: Successful filter wire protected aspiration thrombectomy followed by stenting of the proximal segment of the SVG to RCA with implantation of a 4.0 x 12 mm resolute frontier DES at 18 atmospheric pressure, post stenosis 0%.  TIMI-3 to TIMI-3 flow maintained. ? ?Recommendation: Patient has multiple stents and Plavix was discontinued just recently.  Would recommend continuing aspirin for 1 year along with Plavix and then continuing Plavix indefinitely.  Needs risk modification.  90 mL contrast utilized. ? ?Radiology: ? ?DG Chest Port 1 View  09/04/2021 ?CLINICAL DATA:  Shortness of breath. EXAM: PORTABLE CHEST 1 VIEW  COMPARISON:  May 13, 2021. FINDINGS: Stable cardiomegaly. Sternotomy wires are noted. Mildly increased bilateral interstitial densities are noted concerning for pulmonary edema. Bony thorax is unremarkable. IMPRESSION: Mildly increased bilateral interstitial densities are noted concerning for possible pulmonary edema. ? ? ?Hospital Course: Chris Mcniel. is a 81 y.o. Caucasian male patient with hypertension, hyperlipidemia, diabetes mellitus diagnosed in 2018, CAD,hx of MI with CABG S/P multiple coronary interventions,  H/O stroke in past without residual deficits, chronic systolic and diastolic CHF with EF 54-27% and asymptomatic carotid artery stenosis, IPF. ? ?He was admitted to the hospital on 09/04/2021 with chest pain, brought in via EMS, initially STEMI was activated however EKG was unremarkable from previous but upon presentation to the emergency room had significant inferolateral ST depressions suggestive of ongoing acute ischemia.  He was also noticing gradually worsening shortness of breath even with minimal activity. ? ?He was admitted to the hospital on IV heparin and Aggrastat and Nitropaste with improvement in chest pain and was taken to the cardiac catheterization lab the same day and underwent successful PCI to in-stent thrombotic lesion to previously placed access stent from 2010.  A second layer of stent was placed 4.0 x 12 mm resolute stent after having performed thrombectomy.  Excellent result was obtained.  He remained asymptomatic the following day and ambulated with cardiac rehab without any complications.  Hence felt stable for discharge. ? ?Recommendations on discharge: Patient was started back on low-dose of beta-blocker, initially he was bradycardic.  However upon discharge date, heart rate has remained stable, and in view of cardiomyopathy, he would benefit from being on a beta-blocker, low-dose carvedilol  started.  I have had extensive discussion with the patient regarding weight  loss and diet modification, also spoke to his wife over the telephone in presence of the patient.  He was also started back on Plavix in view of NSTEMI and thrombotic lesion.  Suspect he will probably need long-term Plavix after 1 year and can discontinue aspirin. ? 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  I would prefer him to be on Entresto and also Jardiance or Farxiga in view of LV systolic dysfunction.  He will also need outpatient echocardiogram. ? ?Discharge Exam: ? ?  09/05/2021  ?  8:12 AM 09/05/2021  ?  4:20 AM 09/05/2021  ?  2:54 AM  ?Vitals with BMI  ?Systolic 449 675 916  ?Diastolic 69 79 71  ?Pulse 69 64   ?  ? ?Physical Exam ?Vitals reviewed.  ?Constitutional:   ?   General: He is not in acute distress. ?   Appearance: He is well-developed. He is obese.  ?Neck:  ?   Vascular: JVD present.  ?Cardiovascular:  ?   Rate and Rhythm: Normal rate and regular rhythm.  ?   Pulses:     ?     Carotid pulses are 2+ on the right side and 2+ on the left side with bruit. ?     Dorsalis pedis pulses are 2+ on the right side and 2+ on the left side.  ?     Posterior tibial pulses are 0 on the right side and 0 on the left side.  ?   Heart sounds: Normal heart sounds. No murmur heard. ?  No gallop.  ?Pulmonary:  ?   Effort: Pulmonary effort is normal.  ?   Breath sounds: Rales (left worse than right coarse crackles) present.  ?Musculoskeletal:  ?   Right lower leg: No edema.  ?   Left lower leg: No edema.  ?Skin: ?   General: Skin is warm.  ?Neurological:  ?   Mental Status: He is alert.  ? ? ?Labs: ?  ?Lab Results  ?Component Value Date  ? WBC 7.3 09/05/2021  ? HGB 9.9 (L) 09/05/2021  ? HCT 32.5 (L) 09/05/2021  ? MCV 80.2 09/05/2021  ? PLT 257 09/05/2021  ?  ?Recent Labs  ?Lab 09/04/21 ?1230 09/04/21 ?1326  ?NA 138 138  ?K 4.3 4.4  ?CL 108 108  ?CO2 23  --   ?BUN 17 17  ?CREATININE 1.35* 1.30*  ?CALCIUM 9.1  --   ?PROT 7.1  --    ?BILITOT 0.5  --   ?ALKPHOS 35*  --   ?ALT 15  --   ?AST 24  --   ?GLUCOSE 103* 97  ? ?Lab Results  ?Component Value Date  ? CHOL 110 08/13/2020  ? HDL 35 (L) 08/13/2020  ? Daphne 29 08/13/2020  ? LDLDIRECT 58 08/01/2019  ? TRIG 230 (H) 08/13/2020  ? CHOLHDL 3.1 08/13/2020  ?   ?BNP (last 3 results) ?Recent Labs  ?  12/12/20 ?2218 05/06/21 ?2030 09/04/21 ?1230  ?BNP 142.2* 101.4* 190.3*  ? ? ?HEMOGLOBIN A1C ?Lab Results  ?Component Value Date  ? HGBA1C 7.1 (H) 05/07/2021  ? MPG 157.07 05/07/2021  ? ? ?Cardiac Panel (last 3 results) ?Recent Labs  ?  09/04/21 ?1230 09/04/21 ?1433  ?TROPONINIHS 23* 350*  ?  ? ?TSH ?No results for input(s): TSH in the last 8760 hours. ? ?FOLLOW  UP PLANS AND APPOINTMENTS ?Discharge Instructions   ? ? Amb Referral to Cardiac Rehabilitation   Complete by: As directed ?  ? Diagnosis: Coronary Stents  ? After initial evaluation and assessments completed: Virtual Based Care may be provided alone or in conjunction with Phase 2 Cardiac Rehab based on patient barriers.: Yes  ? ?  ? ?Allergies as of 09/05/2021   ? ?   Reactions  ? Brilinta [ticagrelor] Shortness Of Breath  ? Alpha-gal Hives  ? Crestor [rosuvastatin Calcium] Other (See Comments)  ? Muscle pain- tolerating this 2022, however  ? Lipitor [atorvastatin] Other (See Comments)  ? Intolerable muscle pain all over   ? ?  ? ?  ?Medication List  ?  ? ?STOP taking these medications   ? ?Mylanta Maximum Strength 400-400-40 MG/5ML suspension ?Generic drug: alum & mag hydroxide-simeth ?  ?triamcinolone 0.025 % ointment ?Commonly known as: KENALOG ?  ? ?  ? ?TAKE these medications   ? ?acetaminophen 500 MG tablet ?Commonly known as: TYLENOL ?Take 500 mg by mouth every 6 (six) hours as needed for mild pain. ?  ?aspirin EC 81 MG tablet ?Take 1 tablet (81 mg total) by mouth daily. ?  ?carvedilol 3.125 MG tablet ?Commonly known as: Coreg ?Take 1 tablet (3.125 mg total) by mouth 2 (two) times daily. ?  ?clopidogrel 75 MG tablet ?Commonly known as:  PLAVIX ?Take 1 tablet (75 mg total) by mouth daily with breakfast. ?  ?EPINEPHrine 0.3 mg/0.3 mL Soaj injection ?Commonly known as: EPI-PEN ?Inject 0.3 mg into the muscle as needed for anaphylaxis. ?  ?famotidi

## 2021-09-05 NOTE — Progress Notes (Signed)
TR BAND REMOVAL ? ?LOCATION:    left radial ? ?DEFLATED PER PROTOCOL:    Yes.   ? ?TIME BAND OFF / DRESSING APPLIED:    2130 ? ?SITE UPON ARRIVAL:    Level 1 ? ?SITE AFTER BAND REMOVAL:    Level 1 ? ?CIRCULATION SENSATION AND MOVEMENT:    Within Normal Limits   Yes.   ? ?COMMENTS:   Pt.tolerated procedure well  ?

## 2021-09-05 NOTE — Progress Notes (Signed)
CARDIAC REHAB PHASE I  ? ?Stent education completed with pt. Pt educated on importance of ASA and Plavix. Pt given heart healthy diet. Reviewed site care, restrictions, and exercise guidelines. Will refer to CRP II [place]. Pt anxious to d/c. ? ?[id number] ?Rufina Falco, RN BSN ?09/05/2021 ?10:36 AM ? ?

## 2021-09-08 ENCOUNTER — Telehealth: Payer: Self-pay

## 2021-09-08 ENCOUNTER — Ambulatory Visit (INDEPENDENT_AMBULATORY_CARE_PROVIDER_SITE_OTHER): Payer: Medicare HMO | Admitting: Family Medicine

## 2021-09-08 ENCOUNTER — Other Ambulatory Visit: Payer: Self-pay | Admitting: Cardiology

## 2021-09-08 ENCOUNTER — Other Ambulatory Visit: Payer: Self-pay | Admitting: Student

## 2021-09-08 VITALS — BP 115/80 | HR 96 | Temp 97.0°F | Ht 68.0 in | Wt 232.0 lb

## 2021-09-08 DIAGNOSIS — I509 Heart failure, unspecified: Secondary | ICD-10-CM

## 2021-09-08 DIAGNOSIS — H5340 Unspecified visual field defects: Secondary | ICD-10-CM

## 2021-09-08 DIAGNOSIS — E1159 Type 2 diabetes mellitus with other circulatory complications: Secondary | ICD-10-CM

## 2021-09-08 DIAGNOSIS — H538 Other visual disturbances: Secondary | ICD-10-CM | POA: Diagnosis not present

## 2021-09-08 DIAGNOSIS — I2510 Atherosclerotic heart disease of native coronary artery without angina pectoris: Secondary | ICD-10-CM | POA: Diagnosis not present

## 2021-09-08 NOTE — Progress Notes (Signed)
? ?Subjective:  ? ? Patient ID: Chris [name]., male    DOB: 1941-02-06, 81 y.o.   MRN: [id number] ?Admitted to hospital 4/13-4/15 with NSTEMI- ?Hospital Course: [facility]. is a 81 y.o. Caucasian male patient with hypertension, hyperlipidemia, diabetes mellitus diagnosed in 2018, CAD,hx of MI with CABG S/P multiple coronary interventions,  H/O stroke in past without residual deficits, chronic systolic and diastolic CHF with EF 08-67% and asymptomatic carotid artery stenosis, IPF. ? ?He was admitted to the hospital on 09/04/2021 with chest pain, brought in via EMS, initially STEMI was activated however EKG was unremarkable from previous but upon presentation to the emergency room had significant inferolateral ST depressions suggestive of ongoing acute ischemia.  He was also noticing gradually worsening shortness of breath even with minimal activity. ? ?He was admitted to the hospital on IV heparin and Aggrastat and Nitropaste with improvement in chest pain and was taken to the cardiac catheterization lab the same day and underwent successful PCI to in-stent thrombotic lesion to previously placed access stent from 2010.  A second layer of stent was placed 4.0 x 12 mm resolute stent after having performed thrombectomy.  Excellent result was obtained.  He remained asymptomatic the following day and ambulated with cardiac rehab without any complications.  Hence felt stable for discharge. ?  ?Recommendations on discharge: Patient was started back on low-dose of beta-blocker, initially he was bradycardic.  However upon discharge date, heart rate has remained stable, and in view of cardiomyopathy, he would benefit from being on a beta-blocker, low-dose carvedilol started.  I have had extensive discussion with the patient regarding weight loss and diet modification, also spoke to his wife over the telephone in presence of the patient.  He was also started back on Plavix in view of NSTEMI and thrombotic lesion.   Suspect he will probably need long-term Plavix after 1 year and can discontinue aspirin. ?  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  I would prefer him to be on Entresto and also Jardiance or Farxiga in view of LV systolic dysfunction.  He will also need outpatient echocardiogram. ?  ? ?09/08/21 ?Patient presents today for hospital discharge follow-up.  He denies any residual chest pain or shortness of breath.  He is still taking metformin for his diabetes.  He declines to go back on [place].  However we had a long discussion today regarding Jardiance/Farxiga and the decrease cardiovascular mortality associated with this especially given his congestive heart failure.  He does not recall ever trying medicine or having any side effects from the medicine.  His biggest concern today is blurry vision.  He states that prior to the heart attack, he had a fixed visual field deficit in his left eye.  He describes it as a black "C".  It would move in the center of his vision whenever he was looking.  After his recent heart attack, he reports transient visual changes.  He states after he takes his blood pressure medicine, he will have a darkening of his vision over his left lateral heel.  This is only in the left eye.  It is transient and then resolves.  To me it sounds like temporary ischemia to the retina he does not have any evidence of a permanent retinal detachment, limited funduscopic exam.  He denies any eye pain.  There is no erythema.  He has not seen an ophthalmologist  in years. ?Past Medical History:  ?Diagnosis Date  ? Abnormal exercise myocardial perfusion study   ? 03/2018- 38% ef, see report  ? Arthritis   ? Chronic kidney disease   ? Congestive heart failure (CHF) ([place]) 09/2015  ? EF 35-40% [name]  ? Coronary artery disease   ? a. 1995 s/p CABG x 3 (VG->RCA, LIMA->LAD, VG->OM);  b. 07/2008 Inf MI/Cath/PCI:  VG->RCA 99 - treated with Taxus DES (60m), LIMA and VG->OM patent, LAD 100, LCX 100, RCA 60d, EF 50%;  c. 09/2008 PCI native RCA  w/ 2.5x23 Xience DES, VG->RCA stent patent;  c. 05/2010 Cath: Native 3VD with 3/3 patent grafts, native RCA w 80% ISR prox to graft insertion-->Med Rx.  ? DOE (dyspnea on exertion)   ? ED (erectile dysfunction)   ? GERD (gastroesophageal reflux disease)   ? occ  ? Gout   ? H/O hiatal hernia   ? Hyperlipidemia   ? Hypertension   ? Myocardial infarction ([facility]   ? Neuropathy   ? toes  ? Obesity   ? RBBB   ? RBBB (right bundle branch block)   ? Stenosis of right internal carotid artery   ? 50-69% (2014)  ? Type II diabetes mellitus ([place]   ? Umbilical hernia   ? a. s/p repair.  ? Vertigo   ? ? ?Past Surgical History:  ?Procedure Laterality Date  ? ANKLE ARTHROSCOPY WITH DRILLING/MICROFRACTURE Left 04/13/2013  ? Procedure: LEFT ANKLE ARTHROSCOPY WITH EXTENSIVE DEBRIEDMENT;  Surgeon: JWylene Simmer MD;  [place] [facility]  Service: Orthopedics;  Laterality: Left;  ? ANKLE SURGERY Left 08/18/2013  ? DR HEWITT  ? CARDIAC CATHETERIZATION  2000  ? stents x3  ? CARDIAC CATHETERIZATION  02/15/2014  ? Procedure: LEFT HEART CATH AND CORS/GRAFTS ANGIOGRAPHY;  Surgeon: JLaverda Page MD;  [facility];  Service: Cardiovascular;;  ? COLONOSCOPY    ? CORONARY ARTERY BYPASS GRAFT  1995  ? LIMA to LAD, SVG to RCA, SVG to OM.   ? CORONARY STENT INTERVENTION N/A 08/12/2020  ? Procedure: CORONARY STENT INTERVENTION;  Surgeon: PNigel Mormon MD;  [facility];  Service: Cardiovascular;  Laterality: N/A;  ? CORONARY STENT INTERVENTION N/A 09/04/2021  ? Procedure: CORONARY STENT INTERVENTION;  Surgeon: GAdrian Prows MD;  [facility];  Service: Cardiovascular;  Laterality: N/A;  ? LEFT HEART CATH AND CORS/GRAFTS ANGIOGRAPHY N/A 08/12/2020  ? Procedure: LEFT HEART CATH AND CORS/GRAFTS ANGIOGRAPHY;  Surgeon: PNigel Mormon MD;  [facility];  Service: Cardiovascular;  Laterality: N/A;  ? LEFT HEART CATH AND CORS/GRAFTS ANGIOGRAPHY N/A 09/04/2021  ? Procedure: LEFT HEART CATH AND CORS/GRAFTS ANGIOGRAPHY;  Surgeon: GAdrian Prows MD;  [facility];  Service: Cardiovascular;  Laterality: N/A;  ? TOTAL ANKLE ARTHROPLASTY Left 08/17/2013  ? Procedure: LEFT TOTAL ANKLE REPLACEMENT WITH POSSIBLE GASTROC RECESSION ;  Surgeon: JWylene Simmer MD;  [place] [facility]  Service: Orthopedics;  Laterality: Left;  ? UMBILICAL HERNIA REPAIR  12/2007  ? ?Current Outpatient Medications on File Prior to Visit  ?Medication Sig Dispense Refill  ? acetaminophen (TYLENOL) 500 MG tablet Take 500 mg by mouth every 6 (six) hours as needed for mild pain.    ? aspirin EC 81 MG tablet Take 1 tablet (81 mg total) by mouth daily.    ? carvedilol (COREG) 3.125 MG tablet Take 1 tablet (3.125 mg total) by mouth 2 (two) times daily. [place]  tablet 1  ? clopidogrel (PLAVIX) 75 MG tablet Take 1 tablet (75 mg total) by mouth daily with breakfast. 30 tablet 1  ? EPINEPHrine 0.3 mg/0.3 mL IJ SOAJ injection Inject 0.3 mg into the muscle as needed for anaphylaxis. 1 each 0  ? famotidine (PEPCID) 20 MG tablet Take 20 mg by mouth every evening.    ? furosemide (LASIX) 20 MG tablet Take 1 tablet (20 mg total) by mouth daily. 30 tablet 11  ? metFORMIN (GLUCOPHAGE-XR) 500 MG 24 hr tablet TAKE 2 TABLETS EVERY DAY WITH BREAKFAST (Patient taking differently: 500 mg in the morning and at bedtime.) 180 tablet 3  ? nitroGLYCERIN (NITROSTAT) 0.4 MG SL tablet Place 0.4 mg under the tongue every 5 (five) minutes as needed for chest pain.    ? pantoprazole (PROTONIX) 40 MG tablet Take 1 tablet (40 mg total) by mouth daily. 90 tablet 3  ? potassium chloride SA (KLOR-CON M) 20 MEQ tablet Take 1 tablet (20 mEq total) by mouth daily. 30 tablet 1  ? ?No current facility-administered medications on file prior to visit.  ? ?Allergies  ?Allergen Reactions  ? Brilinta [Ticagrelor] Shortness Of Breath  ?  Alpha-Gal Hives  ? Crestor [Rosuvastatin Calcium] Other (See Comments)  ?  Muscle pain- tolerating this 2022, however  ? Lipitor [Atorvastatin] Other (See Comments)  ?  Intolerable muscle pain all over   ? ?Social

## 2021-09-09 LAB — CBC WITH DIFFERENTIAL/PLATELET
Absolute Monocytes: 872 cells/uL (ref 200–950)
Basophils Absolute: 50 cells/uL (ref 0–200)
Basophils Relative: 0.6 %
Eosinophils Absolute: 324 cells/uL (ref 15–500)
Eosinophils Relative: 3.9 %
HCT: 35.8 % — ABNORMAL LOW (ref 38.5–50.0)
Hemoglobin: 11.3 g/dL — ABNORMAL LOW (ref 13.2–17.1)
Lymphs Abs: 2590 cells/uL (ref 850–3900)
MCH: 24.6 pg — ABNORMAL LOW (ref 27.0–33.0)
MCHC: 31.6 g/dL — ABNORMAL LOW (ref 32.0–36.0)
MCV: 78 fL — ABNORMAL LOW (ref 80.0–100.0)
MPV: 9.7 fL (ref 7.5–12.5)
Monocytes Relative: 10.5 %
Neutro Abs: 4465 cells/uL (ref 1500–7800)
Neutrophils Relative %: 53.8 %
Platelets: 325 10*3/uL (ref 140–400)
RBC: 4.59 10*6/uL (ref 4.20–5.80)
RDW: 16 % — ABNORMAL HIGH (ref 11.0–15.0)
Total Lymphocyte: 31.2 %
WBC: 8.3 10*3/uL (ref 3.8–10.8)

## 2021-09-09 LAB — COMPLETE METABOLIC PANEL WITH GFR
AG Ratio: 1.1 (calc) (ref 1.0–2.5)
ALT: 11 U/L (ref 9–46)
AST: 23 U/L (ref 10–35)
Albumin: 4.3 g/dL (ref 3.6–5.1)
Alkaline phosphatase (APISO): 51 U/L (ref 35–144)
BUN/Creatinine Ratio: 15 (calc) (ref 6–22)
BUN: 29 mg/dL — ABNORMAL HIGH (ref 7–25)
CO2: 22 mmol/L (ref 20–32)
Calcium: 9.8 mg/dL (ref 8.6–10.3)
Chloride: 105 mmol/L (ref 98–110)
Creat: 1.98 mg/dL — ABNORMAL HIGH (ref 0.70–1.28)
Globulin: 3.9 g/dL (calc) — ABNORMAL HIGH (ref 1.9–3.7)
Glucose, Bld: 104 mg/dL — ABNORMAL HIGH (ref 65–99)
Potassium: 5.4 mmol/L — ABNORMAL HIGH (ref 3.5–5.3)
Sodium: 139 mmol/L (ref 135–146)
Total Bilirubin: 0.5 mg/dL (ref 0.2–1.2)
Total Protein: 8.2 g/dL — ABNORMAL HIGH (ref 6.1–8.1)
eGFR: 34 mL/min/{1.73_m2} — ABNORMAL LOW (ref >=60)

## 2021-09-09 LAB — HEMOGLOBIN A1C
Hgb A1c MFr Bld: 6.9 % of total Hgb — ABNORMAL HIGH (ref <5.7)
Mean Plasma Glucose: 151 mg/dL
eAG (mmol/L): 8.4 mmol/L

## 2021-09-10 ENCOUNTER — Telehealth (HOSPITAL_COMMUNITY): Payer: Self-pay

## 2021-09-10 NOTE — Telephone Encounter (Signed)
Called and spoke with pt in regards to CR, pt stated he is not interested at this time.   Closed referral 

## 2021-09-10 NOTE — Telephone Encounter (Signed)
Pt insurance is active and benefits verified through [facility]. Co-pay $10.00, DED $0.00/$0.00 met, out of pocket $3,400.00/$40.00 met, co-insurance 0%. No pre-authorization required. Passport, 09/10/21 @ 2:50PM, OIT#[id number] ?  ?Will contact patient to see if he is interested in the Cardiac Rehab Program. If interested, patient will need to complete follow up appt. Once completed, patient will be contacted for scheduling upon review by the RN Navigator.  ?

## 2021-09-11 DIAGNOSIS — H43393 Other vitreous opacities, bilateral: Secondary | ICD-10-CM | POA: Diagnosis not present

## 2021-09-11 DIAGNOSIS — H53461 Homonymous bilateral field defects, right side: Secondary | ICD-10-CM | POA: Diagnosis not present

## 2021-09-11 DIAGNOSIS — E119 Type 2 diabetes mellitus without complications: Secondary | ICD-10-CM | POA: Diagnosis not present

## 2021-09-11 DIAGNOSIS — H02831 Dermatochalasis of right upper eyelid: Secondary | ICD-10-CM | POA: Diagnosis not present

## 2021-09-11 DIAGNOSIS — H02834 Dermatochalasis of left upper eyelid: Secondary | ICD-10-CM | POA: Diagnosis not present

## 2021-09-11 DIAGNOSIS — H353131 Nonexudative age-related macular degeneration, bilateral, early dry stage: Secondary | ICD-10-CM | POA: Diagnosis not present

## 2021-09-11 DIAGNOSIS — H538 Other visual disturbances: Secondary | ICD-10-CM | POA: Diagnosis not present

## 2021-09-11 DIAGNOSIS — H26493 Other secondary cataract, bilateral: Secondary | ICD-10-CM | POA: Diagnosis not present

## 2021-09-11 DIAGNOSIS — H43812 Vitreous degeneration, left eye: Secondary | ICD-10-CM | POA: Diagnosis not present

## 2021-09-11 LAB — HM DIABETES EYE EXAM

## 2021-09-11 NOTE — Progress Notes (Signed)
? ?Primary Physician/Referring:  Susy Frizzle, MD ? ?Patient ID: Chris Reasons., male    DOB: 1941/05/03, 81 y.o.   MRN: [id number] ? ?Chief Complaint  ?Patient presents with  ? Follow-up  ?  1 week  ? Hospitalization Follow-up  ? Chronic combined systolic and diastolic heart failure ([place])  ? ? ?HPI:   ? ?Chris Reasons.  is a 81 y.o. Caucasian male patient with hypertension, hyperlipidemia, diabetes mellitus diagnosed in 2018, CAD,hx of MI with CABG S/P multiple coronary interventions,  H/O stroke in past without residual deficits, chronic systolic and diastolic CHF with EF 02-54% and asymptomatic carotid artery stenosis, IPF.  Patient underwent PCI to high-grade stenosis of the SVG to RCA in March 2022. ? ?Uptitration of guideline directed medical therapy for heart failure has been limited due to orthostatic hypotension and patient's symptoms of dizziness.  Patient in the past had been resistant to additional medications as he had felt well overall.  ? ?Patient was admitted again with NSTEMI 09/04/2021-09/05/2021, underwent successful PCI to in-stent thrombotic lesion to previously placed access stent from 2010.  A second layer of stent was placed 4.0 x 12 mm resolute stent after having performed thrombectomy. Patient was subsequently discharged, at which time resumed Plavix.  ? ?Patient now present for follow up. Patient has had no recurrence of chest pain since discharge. He continues to have dyspnea, but with improvement since discharge.  Following discharge patient was seen by his PCP at which time he started him on Farxiga.  Patient is tolerating dual antiplatelet therapy without issue.  Patient states he is open to [facility] instead of valsartan, however he reports that PCP did labs yesterday and noted "kidney problems".  We will request records from PCP office. ? ?Past Medical History:  ?Diagnosis Date  ? Abnormal exercise myocardial perfusion study   ? 03/2018- 38% ef, see report  ? Arthritis   ?  Chronic kidney disease   ? Congestive heart failure (CHF) ([place]) 09/2015  ? EF 35-40% [name]  ? Coronary artery disease   ? a. 1995 s/p CABG x 3 (VG->RCA, LIMA->LAD, VG->OM);  b. 07/2008 Inf MI/Cath/PCI: VG->RCA 99 - treated with Taxus DES (68m), LIMA and VG->OM patent, LAD 100, LCX 100, RCA 60d, EF 50%;  c. 09/2008 PCI native RCA  w/ 2.5x23 Xience DES, VG->RCA stent patent;  c. 05/2010 Cath: Native 3VD with 3/3 patent grafts, native RCA w 80% ISR prox to graft insertion-->Med Rx.  ? DOE (dyspnea on exertion)   ? ED (erectile dysfunction)   ? GERD (gastroesophageal reflux disease)   ? occ  ? Gout   ? H/O hiatal hernia   ? Hyperlipidemia   ? Hypertension   ? Myocardial infarction ([facility]   ? Neuropathy   ? toes  ? Obesity   ? RBBB   ? RBBB (right bundle branch block)   ? Stenosis of right internal carotid artery   ? 50-69% (2014)  ? Type II diabetes mellitus ([place]   ? Umbilical hernia   ? a. s/p repair.  ? Vertigo   ? ?Past Surgical History:  ?Procedure Laterality Date  ? ANKLE ARTHROSCOPY WITH DRILLING/MICROFRACTURE Left 04/13/2013  ? Procedure: LEFT ANKLE ARTHROSCOPY WITH EXTENSIVE DEBRIEDMENT;  Surgeon: JWylene Simmer MD;  [place] [facility]  Service: Orthopedics;  Laterality: Left;  ? ANKLE SURGERY Left 08/18/2013  ? DR HEWITT  ? CARDIAC CATHETERIZATION  2000  ? stents x3  ? CARDIAC CATHETERIZATION  02/15/2014  ? Procedure: LEFT HEART CATH AND CORS/GRAFTS ANGIOGRAPHY;  Surgeon: Laverda Page, MD;  Location[facility];  Service: Cardiovascular;;  ? COLONOSCOPY    ? CORONARY ARTERY BYPASS GRAFT  1995  ? LIMA to LAD, SVG to RCA, SVG to OM.   ? CORONARY STENT INTERVENTION N/A 08/12/2020  ? Procedure: CORONARY STENT INTERVENTION;  Surgeon: Nigel Mormon, MD;  Location: [place] [facility];  Service: Cardiovascular;  Laterality: N/A;  ? CORONARY STENT INTERVENTION N/A 09/04/2021  ? Procedure: CORONARY STENT INTERVENTION;  Surgeon: Adrian Prows, MD;  Location: [place] [facility];  Service:  Cardiovascular;  Laterality: N/A;  ? LEFT HEART CATH AND CORS/GRAFTS ANGIOGRAPHY N/A 08/12/2020  ? Procedure: LEFT HEART CATH AND CORS/GRAFTS ANGIOGRAPHY;  Surgeon: Nigel Mormon, MD;  Location: [place] [facility];  Service: Cardiovascular;  Laterality: N/A;  ? LEFT HEART CATH AND CORS/GRAFTS ANGIOGRAPHY N/A 09/04/2021  ? Procedure: LEFT HEART CATH AND CORS/GRAFTS ANGIOGRAPHY;  Surgeon: Adrian Prows, MD;  Location: [place] [facility];  Service: Cardiovascular;  Laterality: N/A;  ? TOTAL ANKLE ARTHROPLASTY Left 08/17/2013  ? Procedure: LEFT TOTAL ANKLE REPLACEMENT WITH POSSIBLE GASTROC RECESSION ;  Surgeon: Wylene Simmer, MD;  Location: [place];  Service: Orthopedics;  Laterality: Left;  ? UMBILICAL HERNIA REPAIR  12/2007  ? ?Family History  ?Problem Relation Age of Onset  ? Aneurysm Mother   ? Heart disease Father   ? Heart attack Father   ? Stroke Brother   ?  ?Social History  ? ?Tobacco Use  ? Smoking status: Former  ?  Packs/day: 1.00  ?  Years: 9.00  ?  Pack years: 9.00  ?  Types: Cigarettes  ?  Quit date: 10/31/1970  ?  Years since quitting: 50.9  ? Smokeless tobacco: Former  ?  Types: Chew  ? Tobacco comments:  ?  chewed for 2 years in his 20's  ?Substance Use Topics  ? Alcohol use: Not Currently  ?  Comment: occ wine last 6 months  ? ?ROS  ?Review of Systems  ?Constitutional: Negative for malaise/fatigue and weight gain.  ?Cardiovascular:  Positive for dyspnea on exertion (stable). Negative for chest pain, claudication, leg swelling, near-syncope, orthopnea, palpitations, paroxysmal nocturnal dyspnea and syncope.  ?Musculoskeletal:  Positive for arthritis.  ?Gastrointestinal:  Negative for melena.  ?Neurological:  Negative for dizziness.  ?Objective  ?Blood pressure 113/71, pulse 74, temperature 97.6 ?F (36.4 ?C), temperature source Temporal, resp. rate 17, height '5\' 8"'$  (1.727 m), weight 230 lb 9.6 oz (104.6 kg), SpO2 93 %.  ? ?  09/12/2021  ?  8:58 AM 09/08/2021  ?  2:54 PM 09/05/2021  ?  8:12 AM  ?Vitals with BMI   ?Height '5\' 8"'$  '5\' 8"'$    ?Weight 230 lbs 10 oz 232 lbs   ?BMI 35.07 35.28   ?Systolic 595 638 756  ?Diastolic 71 80 69  ?Pulse 74 96 69  ? ?Orthostatic VS for the past 72 hrs (Last 3 readings): ? Patient Position BP Location Cuff Size  ?09/12/21 0858 Sitting Left Arm Normal  ? Physical Exam ?Vitals reviewed.  ?Constitutional:   ?   General: He is not in acute distress. ?   Appearance: He is well-developed. He is obese.  ?Neck:  ?   Vascular: No JVD.  ?Cardiovascular:  ?   Rate and Rhythm: Normal rate and regular rhythm.  ?   Pulses:     ?     Carotid pulses are 2+ on  the right side and 2+ on the left side. ?   Heart sounds: Normal heart sounds. No murmur heard. ?  No gallop.  ?   Comments: No JVD, no leg edema.  Fem and Pop pulse difficult to feel due to body habitus.  DP  Normal and PT absent bilateral ? ?Radial access site well-healed.  ?Pulmonary:  ?   Effort: Pulmonary effort is normal.  ?   Breath sounds: Rales (left worse than right coarse crackles) present.  ?Musculoskeletal:  ?   Right lower leg: No edema.  ?   Left lower leg: No edema.  ?Neurological:  ?   Mental Status: He is alert.  ? ? ?Laboratory examination:  ? ?Recent Labs  ?  05/07/21 ?0355 05/13/21 ?1543 08/12/21 ?1253 09/04/21 ?1230 09/04/21 ?1326 09/08/21 ?1532  ?NA 135 137 139 138 138 139  ?K 4.4 4.2 4.7 4.3 4.4 5.4*  ?CL 102 105 103 108 108 105  ?CO2 '24 23 22 23  '$ --  22  ?GLUCOSE 162* 108* 140* 103* 97 104*  ?BUN '16 15 18 17 17 '$ 29*  ?CREATININE 1.41* 1.32* 1.35* 1.35* 1.30* 1.98*  ?CALCIUM 9.5 9.0 9.9 9.1  --  9.8  ?GFRNONAA 51* 55*  --  54*  --   --   ? ?estimated creatinine clearance is 36.1 mL/min (A) (by C-G formula based on SCr of 1.98 mg/dL (H)).  ? ?  Latest Ref Rng & Units 09/08/2021  ?  3:32 PM 09/04/2021  ?  1:26 PM 09/04/2021  ? 12:30 PM  ?CMP  ?Glucose 65 - 99 mg/dL 104   97   103    ?BUN 7 - 25 mg/dL '29   17   17    '$ ?Creatinine 0.70 - 1.28 mg/dL 1.98   1.30   1.35    ?Sodium 135 - 146 mmol/L 139   138   138    ?Potassium 3.5 - 5.3 mmol/L  5.4   4.4   4.3    ?Chloride 98 - 110 mmol/L 105   108   108    ?CO2 20 - 32 mmol/L 22    23    ?Calcium 8.6 - 10.3 mg/dL 9.8    9.1    ?Total Protein 6.1 - 8.1 g/dL 8.2    7.1    ?Total Bilirubin 0.2 - 1.

## 2021-09-12 ENCOUNTER — Ambulatory Visit: Payer: Medicare HMO | Admitting: Student

## 2021-09-12 ENCOUNTER — Other Ambulatory Visit: Payer: Self-pay | Admitting: Student

## 2021-09-12 ENCOUNTER — Encounter: Payer: Self-pay | Admitting: Student

## 2021-09-12 VITALS — BP 113/71 | HR 74 | Temp 97.6°F | Resp 17 | Ht 68.0 in | Wt 230.6 lb

## 2021-09-12 DIAGNOSIS — I25118 Atherosclerotic heart disease of native coronary artery with other forms of angina pectoris: Secondary | ICD-10-CM

## 2021-09-12 DIAGNOSIS — I5042 Chronic combined systolic (congestive) and diastolic (congestive) heart failure: Secondary | ICD-10-CM | POA: Diagnosis not present

## 2021-09-12 MED ORDER — ENTRESTO 24-26 MG PO TABS: 1.0000 | ORAL_TABLET | Freq: Two times a day (BID) | ORAL | 0 refills | Status: DC

## 2021-09-12 MED ORDER — POTASSIUM CHLORIDE 20 MEQ PO PACK: 20.0000 meq | PACK | Freq: Every day | ORAL | 3 refills | Status: DC

## 2021-09-12 MED ORDER — ROSUVASTATIN CALCIUM 40 MG PO TABS: 40.0000 mg | ORAL_TABLET | Freq: Every day | ORAL | 0 refills | Status: DC

## 2021-09-12 MED ORDER — NITROGLYCERIN 0.4 MG SL SUBL: 0.4000 mg | SUBLINGUAL_TABLET | SUBLINGUAL | 3 refills | Status: DC | PRN

## 2021-09-16 ENCOUNTER — Other Ambulatory Visit: Payer: Medicare HMO

## 2021-10-02 DIAGNOSIS — I5042 Chronic combined systolic (congestive) and diastolic (congestive) heart failure: Secondary | ICD-10-CM | POA: Diagnosis not present

## 2021-10-02 NOTE — Progress Notes (Signed)
Chronic Care Management Pharmacy Note  10/16/2021 Name:  Chris [name]. MRN:  [id number] DOB:  [birth year]  Summary: FU PharmD. Patient has stopped Lisabeth Register, Carvedilol on his own and restarted Metformin.  Feels better like this per his report.  Explained benefit but he does not want to take any of them.  Recommendations/Changes made from today's visit: FU with cardiology - monitor for swelling, weight gain and SOB  Plan: FU 6 months with me   Subjective: Chris [name]. is an 81 y.o. year old male who is a primary patient of [name], [name], MD.  The CCM team was consulted for assistance with disease management and care coordination needs.    Engaged with patient by telephone for follow up visit in response to provider referral for pharmacy case management and/or care coordination services.   Consent to Services:  The patient was given the following information about Chronic Care Management services today, agreed to services, and gave verbal consent: 1. CCM service includes personalized support from designated clinical staff supervised by the primary care provider, including individualized plan of care and coordination with other care providers 2. 24/7 contact phone numbers for assistance for urgent and routine care needs. 3. Service will only be billed when office clinical staff spend 20 minutes or more in a month to coordinate care. 4. Only one practitioner may furnish and bill the service in a calendar month. 5.The patient may stop CCM services at any time (effective at the end of the month) by phone call to the office staff. 6. The patient will be responsible for cost sharing (co-pay) of up to 20% of the service fee (after annual deductible is met). Patient agreed to services and consent obtained.  Patient Care Team: Susy Frizzle, MD as PCP - General (Family Medicine) Edythe Clarity, [facility] as Pharmacist (Pharmacist)  Recent office visits:  05/22/21 Jenna Luo, MD - Family Medicine - Hiatal hernia - Referral to GI. Believe the patient was experiencing an esophageal spasm brought on by acid reflux and a hiatal hernia.  I recommended continuing Protonix and famotidine for 6 weeks.  After 6 weeks if he remains asymptomatic, I would decrease to Protonix 40 mg daily alone.  I will consult GI as requested by the emergency room and await their recommendations.   Recent consult visits:  05/22/21 Lawerance Cruel, PA-C - Cardiology - Chronic combined systolic and diastolic heart failure -  Labs were ordered. valsartan (DIOVAN) 40 MG tablet prescribed. Repeat BMP in 1 week. Follow up in 3 months.   03/11/21 Salvatore Marvel, MD - Anaphylactic shock - Asthma / Allergy - Continue to avoid red meat. Follow up in 6 months.    Hospital visits: 05/13/21 Medication Reconciliation was completed by comparing discharge summary, patient's EMR and Pharmacy list, and upon discussion with patient.   Admitted to the hospital on 05/13/21 due to Epi gastric pain. Discharge date was 05/13/21. Discharged from [place]?Medications Started at [facility] Discharge:?? alum & mag hydroxide-simeth (MYLANTA MAXIMUM STRENGTH) 400-400-40 MG/5ML suspension famotidine (PEPCID) 20 MG tablet pantoprazole (PROTONIX) 40 MG tablet   Medication Changes at Hospital Discharge: None noted.    Medications Discontinued at Hospital Discharge: None noted.    Medications that remain the same after Hospital Discharge:??  All other medications will remain the same.     Hospital visits: 05/06/21 Medication Reconciliation was completed by comparing discharge summary, patient's EMR and Pharmacy list, and upon discussion  with patient.   Admitted to the hospital on 05/06/21 due to CHF. Discharge date was 05/07/21. Discharged from [place]?Medications Started at [facility] Discharge:?? None noted.    Medication Changes at Hospital Discharge: None noted.     Medications Discontinued at Hospital Discharge: None noted.    Medications that remain the same after Hospital Discharge:??  All other medications will remain the same.    Hospital visits: 12/12/20 - 12/13/20 Medication Reconciliation was completed by comparing discharge summary, patient's EMR and Pharmacy list, and upon discussion with patient.   Admitted to the hospital on 12/12/20 due to allergic reaction. Discharge date was 12/13/20. Discharged from [place]?Medications Started at [facility] Discharge:?? EPINEPHrine 0.3 mg/0.3 mL IJ SOAJ injection   Medication Changes at Hospital Discharge: None noted.    Medications Discontinued at Hospital Discharge: None noted.   Medications that remain the same after Hospital Discharge:??  All other medications will remain the same.    Objective:  Lab Results  Component Value Date   CREATININE 2.02 (H) 10/03/2021   BUN 18 10/03/2021   EGFR 51 (L) 10/02/2021   GFRNONAA 33 (L) 10/03/2021   GFRAA 65 08/30/2020   NA 139 10/03/2021   K 3.7 10/03/2021   CALCIUM 9.4 10/03/2021   CO2 22 10/03/2021   GLUCOSE 162 (H) 10/03/2021    Lab Results  Component Value Date/Time   HGBA1C 6.9 (H) 09/08/2021 03:32 PM   HGBA1C 7.1 (H) 05/07/2021 03:55 AM   MICROALBUR 0.7 11/11/2018 09:21 AM   MICROALBUR 1.3 11/13/2016 12:36 PM    Last diabetic Eye exam:  Lab Results  Component Value Date/Time   HMDIABEYEEXA No Retinopathy 02/10/2019 12:00 AM    Last diabetic Foot exam: No results found for: HMDIABFOOTEX   Lab Results  Component Value Date   CHOL 110 08/13/2020   HDL 35 (L) 08/13/2020   LDLCALC 29 08/13/2020   LDLDIRECT 58 08/01/2019   TRIG 230 (H) 08/13/2020   CHOLHDL 3.1 08/13/2020       Latest Ref Rng & Units 10/03/2021   11:27 PM 09/08/2021    3:32 PM 09/04/2021   12:30 PM  Hepatic Function  Total Protein 6.5 - 8.1 g/dL 8.0   8.2   7.1    Albumin 3.5 - 5.0 g/dL 3.8    3.4    AST 15 - 41 U/L '24   23   24    ' ALT  0 - 44 U/L '15   11   15    ' Alk Phosphatase 38 - 126 U/L 41    35    Total Bilirubin 0.3 - 1.2 mg/dL 0.7   0.5   0.5    Bilirubin, Direct 0.0 - 0.2 mg/dL 0.1        Lab Results  Component Value Date/Time   TSH 4.86 (H) 08/01/2019 08:35 AM   TSH 2.000 02/15/2014 07:16 PM       Latest Ref Rng & Units 10/03/2021   11:27 PM 09/08/2021    3:32 PM 09/05/2021    2:30 AM  CBC  WBC 4.0 - 10.5 K/uL 8.9   8.3   7.3    Hemoglobin 13.0 - 17.0 g/dL 11.3   11.3   9.9    Hematocrit 39.0 - 52.0 % 36.0   35.8   32.5    Platelets 150 - 400 K/uL 304   325   257  No results found for: VD25OH  Clinical ASCVD: Yes  The ASCVD Risk score (Arnett DK, et al., 2019) failed to calculate for the following [name]:   The patient has a prior MI or stroke diagnosis       02/07/2021    2:21 PM  Depression screen PHQ 2/9  Decreased Interest 0  Down, Depressed, Hopeless 0  PHQ - 2 Score 0      Social History   Tobacco Use  Smoking Status Former   Packs/day: 1.00   Years: 9.00   Pack years: 9.00   Types: Cigarettes   Quit date: 10/31/1970   Years since quitting: 50.9  Smokeless Tobacco Former   Types: Chew  Tobacco Comments   chewed for 2 years in his 20's   BP Readings from Last 3 Encounters:  10/04/21 112/64  09/12/21 113/71  09/08/21 115/80   Pulse Readings from Last 3 Encounters:  10/04/21 68  09/12/21 74  09/08/21 96   Wt Readings from Last 3 Encounters:  09/12/21 230 lb 9.6 oz (104.6 kg)  09/08/21 232 lb (105.2 kg)  09/04/21 233 lb (105.7 kg)   BMI Readings from Last 3 Encounters:  09/12/21 35.06 kg/m  09/08/21 35.28 kg/m  09/04/21 35.43 kg/m    Assessment/Interventions: Review of patient past medical history, allergies, medications, health status, including review of consultants reports, laboratory and other test data, was performed as part of comprehensive evaluation and provision of chronic care management services.   SDOH:  (Social Determinants of Health)  assessments and interventions performed: No  SDOH Screenings   Alcohol Screen: Low Risk    Last Alcohol Screening Score (AUDIT): 0  Depression (PHQ2-9): Low Risk    PHQ-2 Score: 0  Financial Resource Strain: Low Risk    Difficulty of Paying Living Expenses: Not hard at all  Food Insecurity: No Food Insecurity   Worried About Charity fundraiser in the Last Year: Never true   Ran Out of Food in the Last Year: Never true  Housing: Low Risk    Last Housing Risk Score: 0  Physical Activity: Sufficiently Active   Days of Exercise per Week: 5 days   Minutes of Exercise per Session: 30 min  Social Connections: Engineer, building services of Communication with Friends and Family: More than three times a week   Frequency of Social Gatherings with Friends and Family: More than three times a week   Attends Religious Services: More than 4 times per year   Active Member of [employer] or Organizations: Yes   Attends Music therapist: More than 4 times per year   Marital Status: Married  Stress: Stress Concern Present   Feeling of Stress : To some extent  Tobacco Use: Medium Risk   Smoking Tobacco Use: Former   Smokeless Tobacco Use: Former   Passive Exposure: Not on Pensions consultant Needs: No Transportation Needs   Film/video editor (Medical): No   Lack of Transportation (Non-Medical): No    CCM Care Plan  Allergies  Allergen Reactions   Brilinta [Ticagrelor] Shortness Of Breath   Alpha-Gal Hives   Crestor [Rosuvastatin Calcium] Other (See Comments)    Muscle pain- tolerating this 2022, however   Lipitor [Atorvastatin] Other (See Comments)    Intolerable muscle pain all over     Medications Reviewed Today     Reviewed by Edythe Clarity, [facility] (Pharmacist) on 10/16/21 at 1045  Med List Status: <None>   Medication Order Taking? Sig Documenting  Provider Last Dose Status Informant  acetaminophen (TYLENOL) 500 MG tablet [id number] Yes Take 500 mg by mouth every  6 (six) hours as needed for mild pain. [provider] Taking Active Spouse/Significant Other  aspirin EC 81 MG tablet [id number] Yes Take 1 tablet (81 mg total) by mouth daily. Adrian Prows, MD Taking Active Spouse/Significant Other  carvedilol (COREG) 3.125 MG tablet [id number] No Take 1 tablet (3.125 mg total) by mouth 2 (two) times daily.  Patient not taking: Reported on 10/16/2021   Adrian Prows, MD Not Taking Active   clopidogrel (PLAVIX) 75 MG tablet [id number] Yes Take 1 tablet (75 mg total) by mouth daily with breakfast. Adrian Prows, MD Taking Active   dapagliflozin propanediol (FARXIGA) 10 MG TABS tablet [id number] No Take 10 mg by mouth daily.  Patient not taking: Reported on 10/16/2021   [provider] Not Taking Active   EPINEPHrine 0.3 mg/0.3 mL IJ SOAJ injection [id number] Yes Inject 0.3 mg into the muscle as needed for anaphylaxis. Ripley Fraise, MD Taking Active Spouse/Significant Other  famotidine (PEPCID) 20 MG tablet [id number] Yes Take 20 mg by mouth every evening. [provider] Taking Active Spouse/Significant Other  furosemide (LASIX) 20 MG tablet [id number] Yes Take 1 tablet (20 mg total) by mouth daily. Adrian Prows, MD Taking Active   nitroGLYCERIN (NITROSTAT) 0.4 MG SL tablet [id number] Yes Place 1 tablet (0.4 mg total) under the tongue every 5 (five) minutes as needed for chest pain. [name], [name], PA-C Taking Active   pantoprazole (PROTONIX) 40 MG tablet [id number]  Take 1 tablet (40 mg total) by mouth daily. Susy Frizzle, MD  Expired 09/12/21 2359   potassium chloride (KLOR-CON) 20 MEQ packet [id number] Yes Take 20 mEq by mouth daily. [name], [name], PA-C Taking Active   rosuvastatin (CRESTOR) 40 MG tablet [id number] Yes Take 1 tablet (40 mg total) by mouth daily. [name], [name], PA-C Taking Active   sacubitril-valsartan (ENTRESTO) 24-26 MG [id number] No Take 1 tablet by mouth 2 (two) times daily.  Patient not taking: Reported on  10/16/2021   Verneda Skill Not Taking Active             Patient Active Problem List   Diagnosis Date Noted   NSTEMI (non-ST elevated myocardial infarction) ([place]) 09/04/2021   Dysphagia 06/13/2021   Atypical chest pain 06/13/2021   Antiplatelet or antithrombotic long-term use 06/13/2021   Acute exacerbation of idiopathic pulmonary fibrosis ([place]) 05/06/2021   Allergic reaction to alpha-gal 05/06/2021   Unstable angina ([place]) 09/04/2020   Chronic combined systolic and diastolic CHF (congestive heart failure) ([place])    Coronary artery disease involving native coronary artery of native heart with unstable angina pectoris ([place]) 08/12/2020   OSA (obstructive sleep apnea) 03/25/2020   Nocturnal hypoxia 01/17/2020   DOE (dyspnea on exertion) 01/01/2020   Large hiatal hernia 01/01/2020   Pulmonary fibrosis ([place]) 01/01/2020   Abnormal exercise myocardial perfusion study    Congestive heart failure (CHF) (HCC)    Non-ST elevation (NSTEMI) myocardial infarction ([place]) 02/15/2014   Type II diabetes mellitus ([place])    Arthritis of left ankle 08/17/2013   Stenosis of right internal carotid artery    Precordial pain 06/03/2012   TIA (transient ischemic attack) 10/02/2011   Peripheral neuropathy 04/14/2011   CAD (coronary artery disease) 11/04/2010   Hypertension 11/04/2010   Renal insufficiency 11/04/2010   Obese 11/04/2010   CVA (cerebral vascular accident) ([place]) 11/04/2010   Hyperlipemia 11/04/2010    Immunization History  Administered Date(s) Administered   PFIZER(Purple Top)SARS-COV-2 Vaccination 07/09/2019, 08/08/2019   Pneumococcal Conjugate-13 11/13/2016   Pneumococcal Polysaccharide-23 08/18/2013   Tdap 11/13/2016    Conditions to be addressed/monitored:  HTN, CAD, CHF, Hx of TIA/CVA, Type II DM  Care Plan : General Pharmacy (Adult)  Updates made by Edythe Clarity, RPH since 10/16/2021 12:00 AM     Problem: HTN, CAD, CHF, Hx of TIA/CVA, Type II DM    Priority: High  Onset Date: 10/16/2021     Long-Range Goal: Patient-Specific Goal   Start Date: 10/16/2021  Expected End Date: 04/18/2022  This Visit's Progress: On track  Priority: High  Note:   Current Barriers:  Adherence to medical treatment  Pharmacist Clinical Goal(s):  Patient will achieve adherence to monitoring guidelines and medication adherence to achieve therapeutic efficacy achieve improvement in med adherence as evidenced by fill dates through collaboration with PharmD and provider.   Interventions: 1:1 collaboration with Susy Frizzle, MD regarding development and update of comprehensive plan of care as evidenced by provider attestation and co-signature Inter-disciplinary care team collaboration (see longitudinal plan of care) Comprehensive medication review performed; medication list updated in electronic medical record  Hypertension (BP goal <130/80) -Controlled -Current treatment: None noted -Medications previously tried: Entresto, valsartan, carvedilol  -Current home readings: 126/65 -Current dietary habits: watching sugars, carbs, sweets -Current exercise habits: minimal -Denies hypotensive/hypertensive symptoms -Educated on BP goals and benefits of medications for prevention of heart attack, stroke and kidney damage; Exercise goal of 150 minutes per week; Importance of home blood pressure monitoring; Symptoms of hypotension and importance of maintaining adequate hydration; -Counseled to monitor BP at home a few times per week, document, and provide log at future appointments -He stopped Entresto, stopped carvedilol due to making him feel "horrible".  He is aware of risks of not taking these medications and reports BP has been normal.  Feels much better not taking them.  He reports falls walking to the car while he was on these medications. -Recommended he follow up with cardiology on this, will make them aware of medication changes  Diabetes (A1c goal  <7%) -Controlled -Current medications: Metformin 520m two tablets dailyAppropriate, Effective, Safe, Accessible -Medications previously tried: FIran -Current home glucose readings fasting glucose: not checking often at home post prandial glucose: not checking often at home -Denies hypoglycemic/hyperglycemic symptoms -Stopped Farxiga along with other meds due to making him feel bad. -Educated on A1c and blood sugar goals; Benefits on Farxiga for DM and other comorbidities -Counseled to check feet daily and get yearly eye exams -Recommended he monitor glucose at home, continue to work on lifestyle mods for euglycemia.  Heart Failure (Goal: manage symptoms and prevent exacerbations) -Uncontrolled -Last ejection fraction: 35-40%  -Current treatment: None -Medications previously tried: FLisabeth Register carvedilol  -Current home BP/HR readings: "normal" -Educated on Benefits of medications for managing symptoms and prolonging life Importance of weighing daily; if you gain more than 3 pounds in one day or 5 pounds in one week, contact providers Importance of blood pressure control -He refuses to take the medications for his heart, stating he feels better without them.  Stopped all of them at one time so unsure which was causing his symptoms.  Has had issues with Entresto previously.  Recommended he FU with cardiology ASAP, he has upcoming appt on 6/2 -Notified cardiology and PCP of med changes, patient to FU with cardiology on 6/2.  He is aware of benefits of these meds and just feels better without them.  Patient Goals/Self-Care Activities Patient will:  - take medications as prescribed as evidenced by patient report and record review check blood pressure a few times per week, document, and provide at future appointments  Follow Up Plan: The care management team will reach out to the patient again over the next 180 days.       Medication Assistance: None required.  Patient  affirms current coverage meets needs.  Compliance/Adherence/Medication fill history: Care Gaps: Eye exam, foot exam  Star-Rating Drugs: Rosuvastatin 53m 09/09/21 90ds  [facility]  [facility] [name] [place]Phone: [phone]: [phone] [address] [place]Phone: [phone]: [phone] Uses pill box? Yes Pt endorses 50% compliance  We discussed: Benefits of medication synchronization, packaging and delivery as well as enhanced pharmacist oversight with [employer]. Patient decided to: Continue current medication management strategy  Care Plan and Follow Up Patient Decision:  Patient agrees to Care Plan and Follow-up.  Plan: The care management team will reach out to the patient again over the next 180 days.  CBeverly Milch PharmD, CPP Clinical Pharmacist Practitioner [phone]

## 2021-10-03 ENCOUNTER — Emergency Department (HOSPITAL_COMMUNITY)
Admission: EM | Admit: 2021-10-03 | Discharge: 2021-10-04 | Disposition: A | Discharge status: Home / Self Care | Payer: Medicare HMO | Attending: Emergency Medicine | Admitting: Emergency Medicine

## 2021-10-03 ENCOUNTER — Other Ambulatory Visit: Payer: Self-pay

## 2021-10-03 DIAGNOSIS — Z7902 Long term (current) use of antithrombotics/antiplatelets: Secondary | ICD-10-CM | POA: Diagnosis not present

## 2021-10-03 DIAGNOSIS — K222 Esophageal obstruction: Secondary | ICD-10-CM | POA: Insufficient documentation

## 2021-10-03 DIAGNOSIS — K449 Diaphragmatic hernia without obstruction or gangrene: Secondary | ICD-10-CM | POA: Diagnosis not present

## 2021-10-03 DIAGNOSIS — Z7982 Long term (current) use of aspirin: Secondary | ICD-10-CM | POA: Insufficient documentation

## 2021-10-03 DIAGNOSIS — N189 Chronic kidney disease, unspecified: Secondary | ICD-10-CM | POA: Insufficient documentation

## 2021-10-03 DIAGNOSIS — R0789 Other chest pain: Secondary | ICD-10-CM | POA: Diagnosis not present

## 2021-10-03 DIAGNOSIS — J9811 Atelectasis: Secondary | ICD-10-CM | POA: Diagnosis not present

## 2021-10-03 DIAGNOSIS — I2510 Atherosclerotic heart disease of native coronary artery without angina pectoris: Secondary | ICD-10-CM | POA: Insufficient documentation

## 2021-10-03 DIAGNOSIS — R079 Chest pain, unspecified: Secondary | ICD-10-CM | POA: Diagnosis not present

## 2021-10-03 DIAGNOSIS — T18128A Food in esophagus causing other injury, initial encounter: Secondary | ICD-10-CM | POA: Diagnosis not present

## 2021-10-03 LAB — BASIC METABOLIC PANEL
BUN/Creatinine Ratio: 11 (ref 10–24)
BUN: 15 mg/dL (ref 8–27)
CO2: 20 mmol/L (ref 20–29)
Calcium: 9.8 mg/dL (ref 8.6–10.2)
Chloride: 105 mmol/L (ref 96–106)
Creatinine, Ser: 1.41 mg/dL — ABNORMAL HIGH (ref 0.76–1.27)
Glucose: 86 mg/dL (ref 70–99)
Potassium: 4.6 mmol/L (ref 3.5–5.2)
Sodium: 142 mmol/L (ref 134–144)
eGFR: 51 mL/min/{1.73_m2} — ABNORMAL LOW (ref >59)

## 2021-10-03 NOTE — ED Provider Triage Note (Addendum)
?  Emergency Medicine Provider Triage Evaluation Note ? ?MRN:  [id number]  ?Arrival date & time: 10/03/21    ?Medically screening exam initiated at 11:24 PM.   ?CC:   ?Chest Pain and food bolus ?  ?HPI:  ?Chris [name]. is a 81 y.o. year-old male presents to the ED with chief complaint of chest pain.  Onset tonight.  He states that he was eating some chicken by and feels like it is stuck in his throat.  He has tried eating and drinking since, but immediately vomits anything he ingests back up.  He states that it still feels stuck in his throat. ? ?Sees [facility]. ? ?Additionally, patient reports recent MI and catheterization. ? ?History provided by patient. ?ROS:  ?-As included in HPI ?PE:  ? ?Vitals:  ? 10/03/21 2303  ?BP: 107/63  ?Pulse: 72  ?Resp: 16  ?Temp: 97.7 ?F (36.5 ?C)  ?SpO2: 98%  ?  ?Non-toxic appearing ?No respiratory distress ? ?MDM:  ?Based on signs and symptoms, esophageal food bolus is highest on my differential, followed by ACS. ?I've ordered labs in triage to expedite lab/diagnostic workup. ? ?Patient was informed that the remainder of the evaluation will be completed by another provider, this initial triage assessment does not replace that evaluation, and the importance of remaining in the ED until their evaluation is complete. ? ?  ?Montine Circle, PA-C ?10/03/21 2325 ? ?  ?Montine Circle, PA-C ?10/03/21 2326 ? ?

## 2021-10-03 NOTE — ED Triage Notes (Signed)
Pt reported to ED with c/o pain to middle of chest. States he has a hx of hiatal hernia but could not receive intervention d/t being on blood thinners. States he recently had cardiac arrest after heart attack last week.  States he ate some "chicken pie" today and it got stuck in his throat. Attempted to drink water but food did not go down.  ?

## 2021-10-04 ENCOUNTER — Emergency Department (HOSPITAL_COMMUNITY): Payer: Medicare HMO

## 2021-10-04 DIAGNOSIS — K449 Diaphragmatic hernia without obstruction or gangrene: Secondary | ICD-10-CM | POA: Diagnosis not present

## 2021-10-04 DIAGNOSIS — R079 Chest pain, unspecified: Secondary | ICD-10-CM | POA: Diagnosis not present

## 2021-10-04 DIAGNOSIS — J9811 Atelectasis: Secondary | ICD-10-CM | POA: Diagnosis not present

## 2021-10-04 LAB — CBC
HCT: 36 % — ABNORMAL LOW (ref 39.0–52.0)
Hemoglobin: 11.3 g/dL — ABNORMAL LOW (ref 13.0–17.0)
MCH: 24.9 pg — ABNORMAL LOW (ref 26.0–34.0)
MCHC: 31.4 g/dL (ref 30.0–36.0)
MCV: 79.5 fL — ABNORMAL LOW (ref 80.0–100.0)
Platelets: 304 10*3/uL (ref 150–400)
RBC: 4.53 MIL/uL (ref 4.22–5.81)
RDW: 17.1 % — ABNORMAL HIGH (ref 11.5–15.5)
WBC: 8.9 10*3/uL (ref 4.0–10.5)
nRBC: 0 % (ref 0.0–0.2)

## 2021-10-04 LAB — BASIC METABOLIC PANEL
Anion gap: 10 (ref 5–15)
BUN: 18 mg/dL (ref 8–23)
CO2: 22 mmol/L (ref 22–32)
Calcium: 9.4 mg/dL (ref 8.9–10.3)
Chloride: 107 mmol/L (ref 98–111)
Creatinine, Ser: 2.02 mg/dL — ABNORMAL HIGH (ref 0.61–1.24)
GFR, Estimated: 33 mL/min — ABNORMAL LOW (ref >60)
Glucose, Bld: 162 mg/dL — ABNORMAL HIGH (ref 70–99)
Potassium: 3.7 mmol/L (ref 3.5–5.1)
Sodium: 139 mmol/L (ref 135–145)

## 2021-10-04 LAB — HEPATIC FUNCTION PANEL
ALT: 15 U/L (ref 0–44)
AST: 24 U/L (ref 15–41)
Albumin: 3.8 g/dL (ref 3.5–5.0)
Alkaline Phosphatase: 41 U/L (ref 38–126)
Bilirubin, Direct: 0.1 mg/dL (ref 0.0–0.2)
Indirect Bilirubin: 0.6 mg/dL (ref 0.3–0.9)
Total Bilirubin: 0.7 mg/dL (ref 0.3–1.2)
Total Protein: 8 g/dL (ref 6.5–8.1)

## 2021-10-04 LAB — LIPASE, BLOOD: Lipase: 55 U/L — ABNORMAL HIGH (ref 11–51)

## 2021-10-04 LAB — TROPONIN I (HIGH SENSITIVITY): Troponin I (High Sensitivity): 10 ng/L (ref <18)

## 2021-10-04 NOTE — ED Provider Notes (Signed)
?Chris [name] ?Provider Note ? ? ?CSN: [id number] ?Arrival date & time: 10/03/21  2256 ? ?  ? ?History ? ?Chief Complaint  ?Patient presents with  ? Chest Pain  ? food bolus  ? ? ?Chris [name]. is a 81 y.o. male. ? ?The history is provided by the patient and medical records.  ?Chest Pain ?Chris [name]. is a 81 y.o. male who presents to the Emergency Department complaining of food stuck in throat.  He presents to the emergency department due to concern that he had a chicken pot pie stuck in his throat.  He was eating a chicken pot pie and it felt like it got stuck at the point of his lower chest/hiatal hernia region.  He developed severe pain at this time and was unable to keep anything down that he attempted to drink.  This occurred at dinnertime.  About 2 hours after his ED arrival he felt like it passed and he has been able to swallow liquids since that time.  ? ?Home Medications ?Prior to Admission medications   ?Medication Sig Start Date End Date Taking? Authorizing Provider  ?acetaminophen (TYLENOL) 500 MG tablet Take 500 mg by mouth every 6 (six) hours as needed for mild pain.    [provider]  ?aspirin EC 81 MG tablet Take 1 tablet (81 mg total) by mouth daily. 02/16/14   Adrian Prows, MD  ?carvedilol (COREG) 3.125 MG tablet Take 1 tablet (3.125 mg total) by mouth 2 (two) times daily. 09/05/21 09/05/22  Adrian Prows, MD  ?clopidogrel (PLAVIX) 75 MG tablet Take 1 tablet (75 mg total) by mouth daily with breakfast. 09/06/21   Adrian Prows, MD  ?dapagliflozin propanediol (FARXIGA) 10 MG TABS tablet Take 10 mg by mouth daily.    [provider]  ?EPINEPHrine 0.3 mg/0.3 mL IJ SOAJ injection Inject 0.3 mg into the muscle as needed for anaphylaxis. 12/12/20   Ripley Fraise, MD  ?famotidine (PEPCID) 20 MG tablet Take 20 mg by mouth every evening.    [provider]  ?furosemide (LASIX) 20 MG tablet Take 1 tablet (20 mg total) by mouth daily. 09/05/21  09/05/22  Adrian Prows, MD  ?nitroGLYCERIN (NITROSTAT) 0.4 MG SL tablet Place 1 tablet (0.4 mg total) under the tongue every 5 (five) minutes as needed for chest pain. 09/12/21   [name], [name], PA-C  ?pantoprazole (PROTONIX) 40 MG tablet Take 1 tablet (40 mg total) by mouth daily. 05/22/21 09/12/21  Susy Frizzle, MD  ?potassium chloride (KLOR-CON) 20 MEQ packet Take 20 mEq by mouth daily. 09/12/21   [name], [name], PA-C  ?rosuvastatin (CRESTOR) 40 MG tablet Take 1 tablet (40 mg total) by mouth daily. 09/12/21   [name], [name], PA-C  ?sacubitril-valsartan (ENTRESTO) 24-26 MG Take 1 tablet by mouth 2 (two) times daily. 09/12/21   [name], [name], PA-C  ?   ? ?Allergies    ?Brilinta [ticagrelor], Alpha-gal, Crestor [rosuvastatin calcium], and Lipitor [atorvastatin]   ? ?Review of Systems   ?Review of Systems  ?Cardiovascular:  Positive for chest pain.  ?All other systems reviewed and are negative. ? ?Physical Exam ?Updated Vital Signs ?BP 112/64   Pulse 68   Temp 98 ?F (36.7 ?C) (Oral)   Resp 16   SpO2 98%  ?Physical Exam ?Vitals and nursing note reviewed.  ?Constitutional:   ?   Appearance: He is well-developed.  ?HENT:  ?   Head: Normocephalic and atraumatic.  ?Cardiovascular:  ?  Rate and Rhythm: Normal rate and regular rhythm.  ?   Heart sounds: No murmur heard. ?Pulmonary:  ?   Effort: Pulmonary effort is normal. No respiratory distress.  ?   Breath sounds: Normal breath sounds.  ?Abdominal:  ?   Palpations: Abdomen is soft.  ?   Tenderness: There is no abdominal tenderness. There is no guarding or rebound.  ?Musculoskeletal:     ?   General: No tenderness.  ?Skin: ?   General: Skin is warm and dry.  ?Neurological:  ?   Mental Status: He is alert and oriented to person, place, and time.  ?Psychiatric:     ?   Behavior: Behavior normal.  ? ? ?ED Results / Procedures / Treatments   ?Labs ?(all labs ordered are listed, but only abnormal results are displayed) ?Labs Reviewed  ?BASIC METABOLIC  PANEL - Abnormal; Notable for the following components:  ?    Result Value  ? Glucose, Bld 162 (*)   ? Creatinine, Ser 2.02 (*)   ? GFR, Estimated 33 (*)   ? All other components within normal limits  ?CBC - Abnormal; Notable for the following components:  ? Hemoglobin 11.3 (*)   ? HCT 36.0 (*)   ? MCV 79.5 (*)   ? MCH 24.9 (*)   ? RDW 17.1 (*)   ? All other components within normal limits  ?LIPASE, BLOOD - Abnormal; Notable for the following components:  ? Lipase 55 (*)   ? All other components within normal limits  ?HEPATIC FUNCTION PANEL  ?TROPONIN I (HIGH SENSITIVITY)  ? ? ?EKG ?EKG Interpretation ? ?Date/Time:  Friday Oct 03 2021 23:09:27 EDT ?Ventricular Rate:  71 ?PR Interval:  186 ?QRS Duration: 130 ?QT Interval:  464 ?QTC Calculation: 504 ?R Axis:   137 ?Text Interpretation: Sinus rhythm with occasional Premature ventricular complexes Right bundle branch block Possible Lateral infarct , age undetermined Inferior infarct , age undetermined Abnormal ECG Confirmed by Quintella Reichert [id number]) on 10/04/2021 1:51:58 AM ? ?Radiology ?DG Chest 2 View ? ?Result Date: 10/04/2021 ?CLINICAL DATA:  Chest pain. EXAM: CHEST - 2 VIEW COMPARISON:  09/04/2021. FINDINGS: The heart size and mediastinal contours are stable. There is a moderate to large hiatal hernia with air-fluid levels. Lung volumes are low and atelectasis is noted at the lung bases. Interstitial prominence is noted bilaterally and unchanged from the prior exam. No consolidation, effusion, or pneumothorax. Sternotomy wires are present over the midline. Degenerative changes are noted in the thoracic spine. IMPRESSION: 1. Interstitial prominence in the lungs bilaterally, possible edema or infiltrate 2. Low lung volumes with atelectasis at the lung bases. 3. Moderate to large hiatal hernia. Electronically Signed   By: Brett Fairy M.D.   On: 10/04/2021 01:46   ? ?Procedures ?Procedures  ? ? ?Medications Ordered in ED ?Medications - No data to display ? ?ED Course/  Medical Decision Making/ A&P ?  ?                        ?Medical Decision Making ?Amount and/or Complexity of Data Reviewed ?Labs: ordered. ? ? ?Patient with extensive medical history including coronary artery disease, CKD, hiatal hernia here for evaluation of chest discomfort with sensation of food getting stuck in his throat.  During his ED wait the sensation passed and he was able to swallow liquids without difficulty.  Labs with stable renal insufficiency when compared in epic.  Chest x-ray personally reviewed.  He he has  a large hiatal hernia.  Current clinical picture is not consistent with pulmonary edema, acute CHF, pneumonia, ACS.  Discussed with patient home care for esophageal foreign body that is since resolved.  Discussed importance of GI follow-up as well as return precautions for new or concerning symptoms. ? ? ? ? ? ? ?Final Clinical Impression(s) / ED Diagnoses ?Final diagnoses:  ?Esophageal obstruction due to food impaction  ? ? ?Rx / DC Orders ?ED Discharge Orders   ? ? None  ? ?  ? ? ?  ?Quintella Reichert, MD ?10/04/21 [phone] ? ?

## 2021-10-04 NOTE — Discharge Instructions (Addendum)
Continue your current medications as prescribed.  Get rechecked immediately if you have symptoms of food stuck in your throat again.  Take very small bites and chew your food thoroughly. ?

## 2021-10-04 NOTE — ED Notes (Signed)
PO challenge performed on this patient, patient was able to tolerate sips of water and saltine crackers without vomiting nor nausea. MD Ralene Bathe made aware. ?

## 2021-10-06 NOTE — Progress Notes (Signed)
Called and spoke to patient he is aware of results. He did ask about his stomach "what are we going to do about it" patient also made [place] aware he had to go to the ED I told patient to f/u with whoever is following up with him about his hernia he said he did but they won't do anything about it.

## 2021-10-07 NOTE — Progress Notes (Signed)
We do not manage hiatal hernias, if that is what he is referring to. So there is nothing we would recommend regarding his stomach. Will defer to GI/PCP.

## 2021-10-15 ENCOUNTER — Ambulatory Visit: Payer: Medicare HMO

## 2021-10-15 DIAGNOSIS — I5042 Chronic combined systolic (congestive) and diastolic (congestive) heart failure: Secondary | ICD-10-CM | POA: Diagnosis not present

## 2021-10-15 DIAGNOSIS — I6523 Occlusion and stenosis of bilateral carotid arteries: Secondary | ICD-10-CM

## 2021-10-16 ENCOUNTER — Ambulatory Visit (INDEPENDENT_AMBULATORY_CARE_PROVIDER_SITE_OTHER): Payer: Medicare HMO | Admitting: Pharmacist

## 2021-10-16 DIAGNOSIS — I10 Essential (primary) hypertension: Secondary | ICD-10-CM

## 2021-10-16 DIAGNOSIS — E119 Type 2 diabetes mellitus without complications: Secondary | ICD-10-CM

## 2021-10-16 DIAGNOSIS — I5042 Chronic combined systolic (congestive) and diastolic (congestive) heart failure: Secondary | ICD-10-CM

## 2021-10-16 NOTE — Patient Instructions (Addendum)
Visit Information   Goals Addressed             This Visit's Progress    Track and Manage Symptoms-Heart Failure       Timeframe:  Long-Range Goal Priority:  High Start Date:  10/16/21                           Expected End Date: 04/18/22                      Follow Up Date 01/16/22    - begin a heart failure diary - develop a rescue plan - eat more whole grains, fruits and vegetables, lean meats and healthy fats - track symptoms and what helps feel better or worse    Why is this important?   You will be able to handle your symptoms better if you keep track of them.  Making some simple changes to your lifestyle will help.  Eating healthy is one thing you can do to take good care of yourself.    Notes:        Patient Care Plan: General Pharmacy (Adult)     Problem Identified: HTN, CAD, CHF, Hx of TIA/CVA, Type II DM   Priority: High  Onset Date: 10/16/2021     Long-Range Goal: Patient-Specific Goal   Start Date: 10/16/2021  Expected End Date: 04/18/2022  This Visit's Progress: On track  Priority: High  Note:   Current Barriers:  Adherence to medical treatment  Pharmacist Clinical Goal(s):  Patient will achieve adherence to monitoring guidelines and medication adherence to achieve therapeutic efficacy achieve improvement in med adherence as evidenced by fill dates through collaboration with PharmD and provider.   Interventions: 1:1 collaboration with Susy Frizzle, MD regarding development and update of comprehensive plan of care as evidenced by provider attestation and co-signature Inter-disciplinary care team collaboration (see longitudinal plan of care) Comprehensive medication review performed; medication list updated in electronic medical record  Hypertension (BP goal <130/80) -Controlled -Current treatment: None noted -Medications previously tried: Entresto, valsartan, carvedilol  -Current home readings: 126/65 -Current dietary habits: watching  sugars, carbs, sweets -Current exercise habits: minimal -Denies hypotensive/hypertensive symptoms -Educated on BP goals and benefits of medications for prevention of heart attack, stroke and kidney damage; Exercise goal of 150 minutes per week; Importance of home blood pressure monitoring; Symptoms of hypotension and importance of maintaining adequate hydration; -Counseled to monitor BP at home a few times per week, document, and provide log at future appointments -He stopped Entresto, stopped carvedilol due to making him feel "horrible".  He is aware of risks of not taking these medications and reports BP has been normal.  Feels much better not taking them.  He reports falls walking to the car while he was on these medications. -Recommended he follow up with cardiology on this, will make them aware of medication changes  Diabetes (A1c goal <7%) -Controlled -Current medications: Metformin '500mg'$  two tablets dailyAppropriate, Effective, Safe, Accessible -Medications previously tried: [place]  -Current home glucose readings fasting glucose: not checking often at home post prandial glucose: not checking often at home -Denies hypoglycemic/hyperglycemic symptoms -Stopped Farxiga along with other meds due to making him feel bad. -Educated on A1c and blood sugar goals; Benefits on Farxiga for DM and other comorbidities -Counseled to check feet daily and get yearly eye exams -Recommended he monitor glucose at home, continue to work on lifestyle mods for euglycemia.  Heart Failure (  Goal: manage symptoms and prevent exacerbations) -Uncontrolled -Last ejection fraction: 35-40%  -Current treatment: None -Medications previously tried: Lisabeth Register, carvedilol  -Current home BP/HR readings: "normal" -Educated on Benefits of medications for managing symptoms and prolonging life Importance of weighing daily; if you gain more than 3 pounds in one day or 5 pounds in one week, contact  providers Importance of blood pressure control -He refuses to take the medications for his heart, stating he feels better without them.  Stopped all of them at one time so unsure which was causing his symptoms.  Has had issues with Entresto previously.  Recommended he FU with cardiology ASAP, he has upcoming appt on 6/2 -Notified cardiology and PCP of med changes, patient to FU with cardiology on 6/2.  He is aware of benefits of these meds and just feels better without them.  Patient Goals/Self-Care Activities Patient will:  - take medications as prescribed as evidenced by patient report and record review check blood pressure a few times per week, document, and provide at future appointments  Follow Up Plan: The care management team will reach out to the patient again over the next 180 days.       The patient verbalized understanding of instructions, educational materials, and care plan provided today and DECLINED offer to receive copy of patient instructions, educational materials, and care plan.  Telephone follow up appointment with pharmacy team member scheduled for: 6 months  Edythe Clarity, [place], PharmD, [place] Clinical Pharmacist Practitioner [place] [phone]

## 2021-10-22 DIAGNOSIS — E1159 Type 2 diabetes mellitus with other circulatory complications: Secondary | ICD-10-CM | POA: Diagnosis not present

## 2021-10-22 DIAGNOSIS — I110 Hypertensive heart disease with heart failure: Secondary | ICD-10-CM

## 2021-10-22 DIAGNOSIS — Z87891 Personal history of nicotine dependence: Secondary | ICD-10-CM

## 2021-10-22 DIAGNOSIS — I509 Heart failure, unspecified: Secondary | ICD-10-CM | POA: Diagnosis not present

## 2021-10-22 DIAGNOSIS — Z7984 Long term (current) use of oral hypoglycemic drugs: Secondary | ICD-10-CM | POA: Diagnosis not present

## 2021-10-23 NOTE — Progress Notes (Unsigned)
Primary Physician/Referring:  Susy Frizzle, MD  Patient ID: Chris Reasons., male    DOB: [birth year], 81 y.o.   MRN: [id number]  No chief complaint on file.   HPI:    Chris Venn.  is a 81 y.o. Caucasian male patient with hypertension, hyperlipidemia, diabetes mellitus diagnosed in 2018, CAD,hx of MI with CABG S/P multiple coronary interventions,  H/O stroke in past without residual deficits, chronic systolic and diastolic CHF with EF 53-97% and asymptomatic carotid artery stenosis, IPF.  Patient underwent PCI to high-grade stenosis of the SVG to RCA in March 2022.  Uptitration of guideline directed medical therapy for heart failure has been limited due to orthostatic hypotension and patient's symptoms of dizziness.  Patient in the past had been resistant to additional medications as he had felt well overall.   Patient was admitted again with NSTEMI 09/04/2021-09/05/2021, underwent successful PCI to in-stent thrombotic lesion to previously placed access stent from 2010.  A second layer of stent was placed 4.0 x 12 mm resolute stent after having performed thrombectomy. Patient was subsequently discharged, at which time resumed Plavix.   Patient now present for follow up. Patient has had no recurrence of chest pain since discharge. He continues to have dyspnea, but with improvement since discharge.  Following discharge patient was seen by his PCP at which time he started him on Farxiga.  Patient is tolerating dual antiplatelet therapy without issue.  Patient states he is open to [facility] instead of valsartan, however he reports that PCP did labs yesterday and noted "kidney problems".  We will request records from PCP office.  Past Medical History:  Diagnosis Date   Abnormal exercise myocardial perfusion study    03/2018- 38% ef, see report   Arthritis    Chronic kidney disease    Congestive heart failure (CHF) ([place]) 09/2015   EF 35-40% [name]   Coronary artery disease     a. 1995 s/p CABG x 3 (VG->RCA, LIMA->LAD, VG->OM);  b. 07/2008 Inf MI/Cath/PCI: VG->RCA 99 - treated with Taxus DES (76m), LIMA and VG->OM patent, LAD 100, LCX 100, RCA 60d, EF 50%;  c. 09/2008 PCI native RCA  w/ 2.5x23 Xience DES, VG->RCA stent patent;  c. 05/2010 Cath: Native 3VD with 3/3 patent grafts, native RCA w 80% ISR prox to graft insertion-->Med Rx.   DOE (dyspnea on exertion)    ED (erectile dysfunction)    GERD (gastroesophageal reflux disease)    occ   Gout    H/O hiatal hernia    Hyperlipidemia    Hypertension    Myocardial infarction (HProtection    Neuropathy    toes   Obesity    RBBB    RBBB (right bundle branch block)    Stenosis of right internal carotid artery    50-69% (2014)   Type II diabetes mellitus ([place]    Umbilical hernia    a. s/p repair.   Vertigo    Past Surgical History:  Procedure Laterality Date   ANKLE ARTHROSCOPY WITH DRILLING/MICROFRACTURE Left 04/13/2013   Procedure: LEFT ANKLE ARTHROSCOPY WITH EXTENSIVE DEBRIEDMENT;  Surgeon: JWylene Simmer MD;  [facility]  Service: Orthopedics;  Laterality: Left;   ANKLE SURGERY Left 08/18/2013   DR HEWITT   CARDIAC CATHETERIZATION  2000   stents x3   CARDIAC CATHETERIZATION  02/15/2014   Procedure: LEFT HEART CATH AND CORS/GRAFTS ANGIOGRAPHY;  Surgeon: JLaverda Page MD;  [facility];  Service: Cardiovascular;;  COLONOSCOPY     CORONARY ARTERY BYPASS GRAFT  1995   LIMA to LAD, SVG to RCA, SVG to OM.    CORONARY STENT INTERVENTION N/A 08/12/2020   Procedure: CORONARY STENT INTERVENTION;  Surgeon: Nigel Mormon, MD;  Location: [place] CV LAB;  Service: Cardiovascular;  Laterality: N/A;   CORONARY STENT INTERVENTION N/A 09/04/2021   Procedure: CORONARY STENT INTERVENTION;  Surgeon: Adrian Prows, MD;  Location: [place] CV LAB;  Service: Cardiovascular;  Laterality: N/A;   LEFT HEART CATH AND CORS/GRAFTS ANGIOGRAPHY N/A 08/12/2020   Procedure: LEFT HEART CATH AND  CORS/GRAFTS ANGIOGRAPHY;  Surgeon: Nigel Mormon, MD;  Location: [place] CV LAB;  Service: Cardiovascular;  Laterality: N/A;   LEFT HEART CATH AND CORS/GRAFTS ANGIOGRAPHY N/A 09/04/2021   Procedure: LEFT HEART CATH AND CORS/GRAFTS ANGIOGRAPHY;  Surgeon: Adrian Prows, MD;  Location: [place] CV LAB;  Service: Cardiovascular;  Laterality: N/A;   TOTAL ANKLE ARTHROPLASTY Left 08/17/2013   Procedure: LEFT TOTAL ANKLE REPLACEMENT WITH POSSIBLE GASTROC RECESSION ;  Surgeon: Wylene Simmer, MD;  Location: [place];  Service: Orthopedics;  Laterality: Left;   UMBILICAL HERNIA REPAIR  12/2007   Family History  Problem Relation Age of Onset   Aneurysm Mother    Heart disease Father    Heart attack Father    Stroke Brother     Social History   Tobacco Use   Smoking status: Former    Packs/day: 1.00    Years: 9.00    Pack years: 9.00    Types: Cigarettes    Quit date: 10/31/1970    Years since quitting: 51.0   Smokeless tobacco: Former    Types: Chew   Tobacco comments:    chewed for 2 years in his 20's  Substance Use Topics   Alcohol use: Not Currently    Comment: occ wine last 6 months   ROS  Review of Systems  Constitutional: Negative for malaise/fatigue and weight gain.  Cardiovascular:  Positive for dyspnea on exertion (stable). Negative for chest pain, claudication, leg swelling, near-syncope, orthopnea, palpitations, paroxysmal nocturnal dyspnea and syncope.  Musculoskeletal:  Positive for arthritis.  Gastrointestinal:  Negative for melena.  Neurological:  Negative for dizziness.  Objective  There were no vitals taken for this visit.     10/04/2021    4:22 AM 10/03/2021   11:03 PM 09/12/2021    8:58 AM  Vitals with BMI  Height   '5\' 8"'$   Weight   230 lbs 10 oz  BMI   60.10  Systolic 932 355 732  Diastolic 64 63 71  Pulse 68 72 74   No data found.  Physical Exam Vitals reviewed.  Constitutional:      General: He is not in acute distress.    Appearance: He is  well-developed. He is obese.  Neck:     Vascular: No JVD.  Cardiovascular:     Rate and Rhythm: Normal rate and regular rhythm.     Pulses:          Carotid pulses are 2+ on the right side and 2+ on the left side.    Heart sounds: Normal heart sounds. No murmur heard.   No gallop.     Comments: No JVD, no leg edema.  Fem and Pop pulse difficult to feel due to body habitus.  DP  Normal and PT absent bilateral  Radial access site well-healed.  Pulmonary:     Effort: Pulmonary effort is normal.  Breath sounds: Rales (left worse than right coarse crackles) present.  Musculoskeletal:     Right lower leg: No edema.     Left lower leg: No edema.  Neurological:     Mental Status: He is alert.    Laboratory examination:   Recent Labs    05/13/21 1543 08/12/21 1253 09/04/21 1230 09/04/21 1326 09/08/21 1532 10/02/21 1150 10/03/21 2327  NA 137   < > 138   < > 139 142 139  K 4.2   < > 4.3   < > 5.4* 4.6 3.7  CL 105   < > 108   < > 105 105 107  CO2 23   < > 23  --  '22 20 22  '$ GLUCOSE 108*   < > 103*   < > 104* 86 162*  BUN 15   < > 17   < > 29* 15 18  CREATININE 1.32*   < > 1.35*   < > 1.98* 1.41* 2.02*  CALCIUM 9.0   < > 9.1  --  9.8 9.8 9.4  GFRNONAA 55*  --  54*  --   --   --  33*   < > = values in this interval not displayed.    CrCl cannot be calculated (Unknown ideal weight.).     Latest Ref Rng & Units 10/03/2021   11:27 PM 10/02/2021   11:50 AM 09/08/2021    3:32 PM  CMP  Glucose 70 - 99 mg/dL 162   86   104    BUN 8 - 23 mg/dL '18   15   29    '$ Creatinine 0.61 - 1.24 mg/dL 2.02   1.41   1.98    Sodium 135 - 145 mmol/L 139   142   139    Potassium 3.5 - 5.1 mmol/L 3.7   4.6   5.4    Chloride 98 - 111 mmol/L 107   105   105    CO2 22 - 32 mmol/L '22   20   22    '$ Calcium 8.9 - 10.3 mg/dL 9.4   9.8   9.8    Total Protein 6.5 - 8.1 g/dL 8.0    8.2    Total Bilirubin 0.3 - 1.2 mg/dL 0.7    0.5    Alkaline Phos 38 - 126 U/L 41      AST 15 - 41 U/L 24    23    ALT 0 -  44 U/L 15    11        Latest Ref Rng & Units 10/03/2021   11:27 PM 09/08/2021    3:32 PM 09/05/2021    2:30 AM  CBC  WBC 4.0 - 10.5 K/uL 8.9   8.3   7.3    Hemoglobin 13.0 - 17.0 g/dL 11.3   11.3   9.9    Hematocrit 39.0 - 52.0 % 36.0   35.8   32.5    Platelets 150 - 400 K/uL 304   325   257     Lipid Panel No results for input(s): CHOL, TRIG, LDLCALC, VLDL, HDL, CHOLHDL, LDLDIRECT in the last 8760 hours.   HEMOGLOBIN A1C Lab Results  Component Value Date   HGBA1C 6.9 (H) 09/08/2021   MPG 151 09/08/2021   TSH No results for input(s): TSH in the last 8760 hours.  Allergies   Allergies  Allergen Reactions   Brilinta [Ticagrelor] Shortness Of Breath   Alpha-Gal Hives  Crestor [Rosuvastatin Calcium] Other (See Comments)    Muscle pain- tolerating this 2022, however   Lipitor [Atorvastatin] Other (See Comments)    Intolerable muscle pain all over     Medications Prior to Visit:   Outpatient Medications Prior to Visit  Medication Sig Dispense Refill   acetaminophen (TYLENOL) 500 MG tablet Take 500 mg by mouth every 6 (six) hours as needed for mild pain.     aspirin EC 81 MG tablet Take 1 tablet (81 mg total) by mouth daily.     carvedilol (COREG) 3.125 MG tablet Take 1 tablet (3.125 mg total) by mouth 2 (two) times daily. (Patient not taking: Reported on 10/16/2021) 60 tablet 1   clopidogrel (PLAVIX) 75 MG tablet Take 1 tablet (75 mg total) by mouth daily with breakfast. 30 tablet 1   dapagliflozin propanediol (FARXIGA) 10 MG TABS tablet Take 10 mg by mouth daily. (Patient not taking: Reported on 10/16/2021)     EPINEPHrine 0.3 mg/0.3 mL IJ SOAJ injection Inject 0.3 mg into the muscle as needed for anaphylaxis. 1 each 0   famotidine (PEPCID) 20 MG tablet Take 20 mg by mouth every evening.     furosemide (LASIX) 20 MG tablet Take 1 tablet (20 mg total) by mouth daily. 30 tablet 11   nitroGLYCERIN (NITROSTAT) 0.4 MG SL tablet Place 1 tablet (0.4 mg total) under the tongue every 5  (five) minutes as needed for chest pain. 15 tablet 3   pantoprazole (PROTONIX) 40 MG tablet Take 1 tablet (40 mg total) by mouth daily. 90 tablet 3   potassium chloride (KLOR-CON) 20 MEQ packet Take 20 mEq by mouth daily. 90 packet 3   rosuvastatin (CRESTOR) 40 MG tablet Take 1 tablet (40 mg total) by mouth daily. 7 tablet 0   sacubitril-valsartan (ENTRESTO) 24-26 MG Take 1 tablet by mouth 2 (two) times daily. (Patient not taking: Reported on 10/16/2021) 60 tablet 0   No facility-administered medications prior to visit.   Final Medications at End of Visit    No outpatient medications have been marked as taking for the 10/24/21 encounter (Appointment) with Rayetta Pigg, Sonakshi Rolland C, PA-C.    Radiology:   CT angio chest 07/25/2019: 1. No evidence of pulmonary embolism. 2. Scattered ground-glass density throughout both lungs with increased central and subpleural reticulation and areas of mosaic attenuation. No frank honeycombing or significant bronchiectasis. Differential considerations include chronic hypersensitivity pneumonitis or idiopathic interstitial pneumonia such as NSIP. Pulmonary consultation is suggested, as well as high-resolution chest CT follow-up in 6 months. 3. 5 mm pulmonary nodule along the right major fissure. No follow-up needed if patient is low-risk. Non-contrast chest CT can be considered in 12 months if patient is high-risk.  4.  Aortic atherosclerosis (ICD10-I70.0).  CT Chest 12/04/2020:  1. 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 Findings are consistent with UIP per consensus guidelines: Diagnosis of Idiopathic Pulmonary Fibrosis: An Official ATS/ERS/JRS/ALAT Clinical Practice Guideline. [place], Iss 5, [id number], Jan 23 2017. 2. Stable borderline mild cardiomegaly. 3. Large hiatal hernia. 4. Aortic Atherosclerosis (ICD10-I70.0).  Cardiac Studies:   Lexiscan myoview stress  test 04/11/2018: 1. Lexiscan stress test was performed. Exercise capacity was not assessed. No stress symptoms reported. Blood pressure was normal.  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  Stress EKG is non diagnostic for ischemia as it is a  pharmacologic stress. 2. The overall quality of the study is good.  Left ventricular cavity is noted to be normal on the rest and stress studies.  Gated SPECT imaging demonstrates hypokinesis of the basal inferior, basal inferolateral, mid inferior, mid inferolateral, apical inferior and apical lateral myocardial wall(s).  The left ventricular ejection fraction was calculated or visually estimated to be 38%. LSPECT images reveal a large sized, medium intensity, minimally reversible perfusion defect suggestive of large infarct with minimal peri infarct ischemia in LCx/PDA territory.   3. High risk study.  Overnight oximetry test 10/11/2019  Desaturation below 88% 17.6 minutes.  Echocardiogram 11/06/2020:   Left ventricle cavity is normal in size. Moderate concentric hypertrophy of the left ventricle.  Left ventricle regional wall motion findings: Basal inferolateral, Basal inferior, Mid inferolateral and Mid inferior akinesis. Moderate decrease in global wall motion. Visual EF is 35-40%. Doppler evidence of grade II (pseudonormal) diastolic dysfunction, elevated LAP. Left atrial cavity is moderately dilated by volume. Right ventricle cavity is mildly dilated. Normal right ventricular function. Compared to 08/13/2020, no significant change.  Carotid artery duplex 11/06/2020: Doppler velocity suggests stenosis in the right internal carotid artery (1-15%). Doppler velocity suggests stenosis in the left internal carotid artery (16-49%). Antegrade right vertebral artery flow. Antegrade left vertebral artery  flow. Compared to 04/24/2020, right ICA stenosis was 50-69% stenosis. Follow up in one year is appropriate if clinically indicated.  Left Heart Catheterization 09/04/21:  LV: 121/10, EDP 26 mmHg.  Ao 118/62, mean 83 mmHg.  No pressure gradient across the aortic valve. LM: Large vessel, mild disease. LAD: Occluded in the midsegment.  Distal segment supplied by LIMA to LAD which is widely patent. LCx: Occluded in the proximal to mid segment.  OM1 is supplied by SVG to OM which is widely patent. RCA: Occluded in the midsegment just proximal to previously placed stent.  SVG to RCA is patent, previously placed 3.5 x 38 mm resolute Onyx DES is widely patent in the distal RCA, proximal to mid 38 mm Taxus DES placed in May 2010 has a thrombotic 80% stenosis, focal.  There is mild luminal loss throughout the stent.   Intervention data: Successful filter wire protected aspiration thrombectomy followed by stenting of the proximal segment of the SVG to RCA with implantation of a 4.0 x 12 mm resolute frontier DES at 18 atmospheric pressure, post stenosis 0%.  TIMI-3 to TIMI-3 flow maintained.  Recommendation: Patient has multiple stents and Plavix was discontinued just recently.  Would recommend continuing aspirin for 1 year along with Plavix and then continuing Plavix indefinitely.  Needs risk modification.  90 mL contrast utilized.  EKG  09/12/2021: Sinus rhythm at a rate of 63 bpm.  Left atrial enlargement.  Right axis, consider inferior infarct old.  Right bundle branch block with secondary ST-T changes.    11/13/2020: Normal sinus rhythm at rate of 64 beats minute, left atrial enlargement, right axis deviation, cannot exclude inferior infarct old.  Right bundle branch block.  No significant change from 08/12/2020. Cnsider pulmonary disease pattern.    Assessment   No diagnosis found.   No orders of the defined types were placed in this encounter.  There are no discontinued medications.    No orders  of the defined types were placed in this encounter.   Recommendations:   Chris [name].  is a 81 y.o. Caucasian male patient with hypertension, hyperlipidemia, diabetes mellitus diagnosed in 2018, CAD,hx of MI with CABG S/P multiple coronary interventions,  H/O  stroke in past without residual deficits, chronic systolic and diastolic CHF with EF 57-32% and asymptomatic carotid artery stenosis, IPF.  Patient underwent PCI to high-grade stenosis of the SVG to RCA in March 2022.  Uptitration of guideline directed medical therapy for heart failure has been limited due to orthostatic hypotension and patient's symptoms of dizziness.  Patient in the past had been resistant to additional medications as he had felt well overall.   Patient was admitted again with NSTEMI 09/04/2021-09/05/2021, underwent successful PCI to in-stent thrombotic lesion to previously placed access stent from 2010.  A second layer of stent was placed 4.0 x 12 mm resolute stent after having performed thrombectomy. Patient was subsequently discharged, at which time resumed Plavix.   Patient now present for follow up.  Patient's dyspnea has improved, although he does continue to have dyspnea on exertion which is chronic.  He has had no recurrence of chest pain since discharge.  Patient reports medication compliance.  He is tolerating dual antiplatelet therapy, would recommend this for at least 1 year then except indefinitely.  Patient is now on guideline directed medical therapy for heart failure including Coreg, Farxiga, valsartan.  Willing to switch to low-dose Entresto from valsartan, however will obtain recent renal function testing from PCPs office prior to this.  Given recent NSTEMI will obtain repeat echocardiogram.  Follow-up in 6 to 8 weeks, sooner if needed.   Alethia Berthold, PA-C 10/23/2021, 10:21 AM Office: [phone]

## 2021-10-24 ENCOUNTER — Ambulatory Visit: Payer: Medicare HMO | Admitting: Student

## 2021-10-24 ENCOUNTER — Encounter: Payer: Self-pay | Admitting: Student

## 2021-10-24 VITALS — BP 120/67 | HR 77 | Temp 98.1°F | Resp 16 | Ht 68.0 in | Wt 231.8 lb

## 2021-10-24 DIAGNOSIS — I6523 Occlusion and stenosis of bilateral carotid arteries: Secondary | ICD-10-CM

## 2021-10-24 DIAGNOSIS — I5042 Chronic combined systolic (congestive) and diastolic (congestive) heart failure: Secondary | ICD-10-CM | POA: Diagnosis not present

## 2021-10-24 DIAGNOSIS — I25118 Atherosclerotic heart disease of native coronary artery with other forms of angina pectoris: Secondary | ICD-10-CM | POA: Diagnosis not present

## 2021-10-30 ENCOUNTER — Encounter: Payer: Self-pay | Admitting: Family Medicine

## 2021-11-11 ENCOUNTER — Other Ambulatory Visit: Payer: Medicare HMO

## 2021-11-27 ENCOUNTER — Other Ambulatory Visit: Payer: Self-pay | Admitting: Student

## 2022-01-01 ENCOUNTER — Telehealth: Payer: Self-pay | Admitting: Pharmacist

## 2022-01-01 NOTE — Progress Notes (Addendum)
Chronic Care Management Pharmacy Assistant   Name: Chris [name].  MRN: [id number] DOB:    Reason for Encounter: Disease State - Hypertension Call     Recent office visits:  None noted.  Recent consult visits:  10/24/21 Lawerance Cruel, PA-C - Cardiology - CAD -  No medication changes. Follow up in 6 months.   Hospital visits: 10/03/21 Medication Reconciliation was completed by comparing discharge summary, patient's EMR and Pharmacy list, and upon discussion with patient.  Admitted to the hospital on 10/03/21 due to Chest pain. Discharge date was 10/06/21. Discharged from [place]?Medications Started at [facility] Discharge:?? None noted.  Medication Changes at Hospital Discharge: None noted.  Medications Discontinued at Hospital Discharge: None noted.  Medications that remain the same after Hospital Discharge:??  All other medications will remain the same.    Hospital visits: 09/04/21 - 09/05/21 Medication Reconciliation was completed by comparing discharge summary, patient's EMR and Pharmacy list, and upon discussion with patient.  Admitted to the hospital on 09/04/21 due to NSTEMI. Discharge date was 09/05/21. Discharged from [place]?Medications Started at [facility] Discharge:?? carvedilol (Coreg) clopidogrel (PLAVIX) furosemide (Lasix) potassium chloride SA (KLOR-CON M)  Medication Changes at Hospital Discharge: famotidine (PEPCID)  Medications Discontinued at Hospital Discharge: Mylanta Maximum Strength 400-400-40 MG/5ML suspension (alum & mag hydroxide-simeth) triamcinolone 0.025 % ointment (KENALOG)  Medications that remain the same after Hospital Discharge:??  All other medications will remain the same.     Medications: Outpatient Encounter Medications as of 01/01/2022  Medication Sig   acetaminophen (TYLENOL) 500 MG tablet Take 500 mg by mouth every 6 (six) hours as needed for mild pain.   aspirin EC 81 MG tablet  Take 1 tablet (81 mg total) by mouth daily.   carvedilol (COREG) 3.125 MG tablet Take 1 tablet (3.125 mg total) by mouth 2 (two) times daily.   clopidogrel (PLAVIX) 75 MG tablet TAKE 1 TABLET EVERY DAY   EPINEPHrine 0.3 mg/0.3 mL IJ SOAJ injection Inject 0.3 mg into the muscle as needed for anaphylaxis.   famotidine (PEPCID) 20 MG tablet Take 20 mg by mouth every evening.   furosemide (LASIX) 20 MG tablet Take 1 tablet (20 mg total) by mouth daily. (Patient not taking: Reported on 10/24/2021)   nitroGLYCERIN (NITROSTAT) 0.4 MG SL tablet Place 1 tablet (0.4 mg total) under the tongue every 5 (five) minutes as needed for chest pain.   pantoprazole (PROTONIX) 40 MG tablet Take 1 tablet (40 mg total) by mouth daily.   rosuvastatin (CRESTOR) 40 MG tablet Take 1 tablet (40 mg total) by mouth daily.   No facility-administered encounter medications on file as of 01/01/2022.   Current antihypertensive regimen:  carvedilol (COREG) 3.125 MG tablet furosemide (LASIX) 20 MG tablet  How often are you checking your Blood Pressure?  Patient reported checking blood pressures several times a week.   Current home BP readings: 130/70 (average patient reported)    What recent interventions/DTPs have been made by any provider to improve Blood Pressure control since last CPP Visit:  Patient denied any recent medication changes.    Any recent hospitalizations or ED visits since last visit with CPP? Patient did have hospital visits on 10/03/21 and 09/04/21   What diet changes have been made to improve Blood Pressure Control?  Patient is on a low sodium diet.    What exercise is being done to improve your Blood Pressure Control?   Patient reports  he is active daily and walks for exercise.    Adherence Review: Is the patient currently on ACE/ARB medication?  Does the patient have >5 day gap between last estimated fill dates?       Care Gaps   AWV: done 02/07/21 Colonoscopy: unknown  DM Eye Exam: due  02/10/20 DM Foot Exam: due 11/10/19 Microalbumin: done 11/11/18 HbgAIC: done 09/08/21 (6.9) DEXA: N/A Mammogram: N/A   Star Rating Drugs: Valsartan (DIOVAN) 40 MG tablet - last filled 11/26/21 90 days Rosuvastatin (CRESTOR) 40 MG tablet - last filled 11/26/21 90 days  MetFORMIN (GLUCOPHAGE-XR) 500 MG 24 hr tablet - last filled 11/12/21 90 days     Future Appointments  Date Time Provider [place]  02/13/2022  2:00 PM [facility]-NURSE HEALTH ADVISOR [facility]-BS[facility]  04/24/2022 10:15 AM [facility]V-ECHO/VAS 1 PCV-IMG None  05/01/2022  1:30 PM Adrian Prows, MD PCV-PCV None     Jobe Gibbon, [facility] Clinical Pharmacist Assistant  [phone]

## 2022-02-13 DIAGNOSIS — Z0000 Encounter for general adult medical examination without abnormal findings: Secondary | ICD-10-CM

## 2022-02-13 NOTE — Progress Notes (Deleted)
Subjective:   Chris [name]. is a 81 y.o. male who presents for an Initial Medicare Annual Wellness Visit.  Review of Systems    ***       Objective:    There were no vitals filed for this visit. There is no height or weight on file to calculate BMI.     10/03/2021   11:18 PM 05/13/2021    3:35 PM 05/07/2021   12:40 PM 02/07/2021    2:33 PM 08/13/2020    9:00 AM 02/16/2014    6:00 AM 08/18/2013   10:42 AM  Advanced Directives  Does Patient Have a Medical Advance Directive? No No No No No Yes Patient does not have advance directive;Patient would like information  Type of Advance Directive      Living will;Healthcare Power of Attorney   Does patient want to make changes to medical advance directive?      No - Patient declined   Copy of [place] in Chart?      No - copy requested   Would patient like information on creating a medical advance directive?  No - Patient declined No - Patient declined No - Patient declined No - Patient declined  Advance directive packet given  Pre-existing out of facility DNR order (yellow form or pink MOST form)       No    Current Medications (verified) Outpatient Encounter Medications as of 02/13/2022  Medication Sig   acetaminophen (TYLENOL) 500 MG tablet Take 500 mg by mouth every 6 (six) hours as needed for mild pain.   aspirin EC 81 MG tablet Take 1 tablet (81 mg total) by mouth daily.   carvedilol (COREG) 3.125 MG tablet Take 1 tablet (3.125 mg total) by mouth 2 (two) times daily.   clopidogrel (PLAVIX) 75 MG tablet TAKE 1 TABLET EVERY DAY   EPINEPHrine 0.3 mg/0.3 mL IJ SOAJ injection Inject 0.3 mg into the muscle as needed for anaphylaxis.   famotidine (PEPCID) 20 MG tablet Take 20 mg by mouth every evening.   furosemide (LASIX) 20 MG tablet Take 1 tablet (20 mg total) by mouth daily. (Patient not taking: Reported on 10/24/2021)   nitroGLYCERIN (NITROSTAT) 0.4 MG SL tablet Place 1 tablet (0.4 mg total) under the tongue  every 5 (five) minutes as needed for chest pain.   pantoprazole (PROTONIX) 40 MG tablet Take 1 tablet (40 mg total) by mouth daily.   rosuvastatin (CRESTOR) 40 MG tablet Take 1 tablet (40 mg total) by mouth daily.   No facility-administered encounter medications on file as of 02/13/2022.    Allergies (verified) Brilinta [ticagrelor], Alpha-gal, Crestor [rosuvastatin calcium], and Lipitor [atorvastatin]   History: Past Medical History:  Diagnosis Date   Abnormal exercise myocardial perfusion study    03/2018- 38% ef, see report   Arthritis    Chronic kidney disease    Congestive heart failure (CHF) ([place]) 09/2015   EF 35-40% [name]   Coronary artery disease    a. 1995 s/p CABG x 3 (VG->RCA, LIMA->LAD, VG->OM);  b. 07/2008 Inf MI/Cath/PCI: VG->RCA 99 - treated with Taxus DES (57m), LIMA and VG->OM patent, LAD 100, LCX 100, RCA 60d, EF 50%;  c. 09/2008 PCI native RCA  w/ 2.5x23 Xience DES, VG->RCA stent patent;  c. 05/2010 Cath: Native 3VD with 3/3 patent grafts, native RCA w 80% ISR prox to graft insertion-->Med Rx.   DOE (dyspnea on exertion)    ED (erectile dysfunction)    GERD (  gastroesophageal reflux disease)    occ   Gout    H/O hiatal hernia    Hyperlipidemia    Hypertension    Myocardial infarction (HCC)    Neuropathy    toes   Obesity    RBBB    RBBB (right bundle branch block)    Stenosis of right internal carotid artery    50-69% (2014)   Type II diabetes mellitus ([place])    Umbilical hernia    a. s/p repair.   Vertigo    Past Surgical History:  Procedure Laterality Date   ANKLE ARTHROSCOPY WITH DRILLING/MICROFRACTURE Left 04/13/2013   Procedure: LEFT ANKLE ARTHROSCOPY WITH EXTENSIVE DEBRIEDMENT;  Surgeon: Wylene Simmer, MD;  Location: [facility];  Service: Orthopedics;  Laterality: Left;   ANKLE SURGERY Left 08/18/2013   DR HEWITT   CARDIAC CATHETERIZATION  2000   stents x3   CARDIAC CATHETERIZATION  02/15/2014   Procedure: LEFT HEART CATH  AND CORS/GRAFTS ANGIOGRAPHY;  Surgeon: Laverda Page, MD;  Location: [facility] CATH LAB;  Service: Cardiovascular;;   COLONOSCOPY     CORONARY ARTERY BYPASS GRAFT  1995   LIMA to LAD, SVG to RCA, SVG to OM.    CORONARY STENT INTERVENTION N/A 08/12/2020   Procedure: CORONARY STENT INTERVENTION;  Surgeon: Nigel Mormon, MD;  Location: [facility] CV LAB;  Service: Cardiovascular;  Laterality: N/A;   CORONARY STENT INTERVENTION N/A 09/04/2021   Procedure: CORONARY STENT INTERVENTION;  Surgeon: Adrian Prows, MD;  Location: [facility] CV LAB;  Service: Cardiovascular;  Laterality: N/A;   LEFT HEART CATH AND CORS/GRAFTS ANGIOGRAPHY N/A 08/12/2020   Procedure: LEFT HEART CATH AND CORS/GRAFTS ANGIOGRAPHY;  Surgeon: Nigel Mormon, MD;  Location: [facility] [facility] CV LAB;  Service: Cardiovascular;  Laterality: N/A;   LEFT HEART CATH AND CORS/GRAFTS ANGIOGRAPHY N/A 09/04/2021   Procedure: LEFT HEART CATH AND CORS/GRAFTS ANGIOGRAPHY;  Surgeon: Adrian Prows, MD;  Location: [facility] CV LAB;  Service: Cardiovascular;  Laterality: N/A;   TOTAL ANKLE ARTHROPLASTY Left 08/17/2013   Procedure: LEFT TOTAL ANKLE REPLACEMENT WITH POSSIBLE GASTROC RECESSION ;  Surgeon: Wylene Simmer, MD;  Location: [facility];  Service: Orthopedics;  Laterality: Left;   UMBILICAL HERNIA REPAIR  12/2007   Family History  Problem Relation Age of Onset   Aneurysm Mother    Heart disease Father    Heart attack Father    Stroke Brother    Social History   Socioeconomic History   Marital status: Married    Spouse name: Not on file   Number of children: 4   Years of education: Not on file   Highest education level: Not on file  Occupational History   Not on file  Tobacco Use   Smoking status: Former    Packs/day: 1.00    Years: 9.00    Total pack years: 9.00    Types: Cigarettes    Quit date: 10/31/1970    Years since quitting: 51.3   Smokeless tobacco: Former    Types: Chew   Tobacco comments:    chewed for 2 years in his 20's   Vaping Use   Vaping Use: Never used  Substance and Sexual Activity   Alcohol use: Not Currently    Comment: occ wine last 6 months   Drug use: No   Sexual activity: Yes  Other Topics Concern   Not on file  Social History Narrative   Lives in [place] with wife.  Retired.   Social Determinants of Health  Financial Resource Strain: Low Risk  (02/07/2021)   Overall Financial Resource Strain (CARDIA)    Difficulty of Paying Living Expenses: Not hard at all  Food Insecurity: No Food Insecurity (02/07/2021)   Hunger Vital Sign    Worried About Running Out of Food in the Last Year: Never true    Ran Out of Food in the Last Year: Never true  Transportation Needs: No Transportation Needs (02/07/2021)   PRAPARE - Hydrologist (Medical): No    Lack of Transportation (Non-Medical): No  Physical Activity: Sufficiently Active (02/07/2021)   Exercise Vital Sign    Days of Exercise per Week: 5 days    Minutes of Exercise per Session: 30 min  Stress: Stress Concern Present (02/07/2021)   [place]    Feeling of Stress : To some extent  Social Connections: Socially Integrated (02/07/2021)   Social Connection and Isolation Panel [NHANES]    Frequency of Communication with Friends and Family: More than three times a week    Frequency of Social Gatherings with Friends and Family: More than three times a week    Attends Religious Services: More than 4 times per year    Active Member of [employer] or Organizations: Yes    Attends Music therapist: More than 4 times per year    Marital Status: Married    Tobacco Counseling Counseling given: Not Answered Tobacco comments: chewed for 2 years in his 20's   Clinical Intake:                 Diabetic?***         Activities of Daily Living     No data to display          Patient Care Team: Susy Frizzle, MD as PCP -  General (Family Medicine) Edythe Clarity, [facility] as Pharmacist (Pharmacist)  Indicate any recent Medical Services you may have received from other than [facility] providers in the past year (date may be approximate).     Assessment:   This is a routine wellness examination for [place].  Hearing/Vision screen No results found.  Dietary issues and exercise activities discussed:     Goals Addressed   None    Depression Screen    02/07/2021    2:21 PM  PHQ 2/9 Scores  PHQ - 2 Score 0    Fall Risk    02/07/2021    2:34 PM  [place] in the past year? 0  Number falls in past yr: 0  Injury with Fall? 0  Risk for fall due to : No Fall Risks  Follow up Falls prevention discussed    [place]:  Any stairs in or around the home? {YES/NO:21197} If so, are there any without handrails? {YES/NO:21197} Home free of loose throw rugs in walkways, pet beds, electrical cords, etc? {YES/NO:21197} Adequate lighting in your home to reduce risk of falls? {YES/NO:21197}  ASSISTIVE DEVICES UTILIZED TO PREVENT FALLS:  Life alert? {YES/NO:21197} Use of a cane, walker or w/c? {YES/NO:21197} Grab bars in the bathroom? {YES/NO:21197} Shower chair or bench in shower? {YES/NO:21197} Elevated toilet seat or a handicapped toilet? {YES/NO:21197}  TIMED UP AND GO:  Was the test performed? {YES/NO:21197}.  Length of time to ambulate 10 feet: *** sec.   {Appearance of JSHF:0263785}  Cognitive Function:        02/07/2021    2:38  PM  6CIT Screen  What Year? 0 points  What month? 0 points  What time? 0 points  Count back from 20 0 points  Months in reverse 4 points  Repeat phrase 0 points  Total Score 4 points    Immunizations Immunization History  Administered Date(s) Administered   PFIZER(Purple Top)SARS-COV-2 Vaccination 07/09/2019, 08/08/2019   Pneumococcal Conjugate-13 11/13/2016   Pneumococcal Polysaccharide-23 08/18/2013   Tdap  11/13/2016    {TDAP status:2101805}  {Flu Vaccine status:2101806}  {Pneumococcal vaccine status:2101807}  {Covid-19 vaccine status:2101808}  Qualifies for Shingles Vaccine? {YES/NO:21197}  Zostavax completed {YES/NO:21197}  {Shingrix Completed?:2101804}  Screening Tests Health Maintenance  Topic Date Due   Hepatitis C Screening  Never done   Zoster Vaccines- Shingrix (1 of 2) Never done   COVID-19 Vaccine (3 - Pfizer series) 10/03/2019   FOOT EXAM  11/10/2019   Diabetic kidney evaluation - Urine ACR  11/11/2019   INFLUENZA VACCINE  Never done   HEMOGLOBIN A1C  03/10/2022   OPHTHALMOLOGY EXAM  09/12/2022   Diabetic kidney evaluation - GFR measurement  10/04/2022   TETANUS/TDAP  11/14/2026   Pneumonia Vaccine 49+ Years old  Completed   HPV VACCINES  Aged Out    Health Maintenance  Health Maintenance Due  Topic Date Due   Hepatitis C Screening  Never done   Zoster Vaccines- Shingrix (1 of 2) Never done   COVID-19 Vaccine (3 - Pfizer series) 10/03/2019   FOOT EXAM  11/10/2019   Diabetic kidney evaluation - Urine ACR  11/11/2019   INFLUENZA VACCINE  Never done    {Colorectal cancer screening:2101809}  Lung Cancer Screening: (Low Dose CT Chest recommended if Age 81-80 years, 30 pack-year currently smoking OR have quit w/in 15years.) {DOES NOT does:27190::"does not"} qualify.   Lung Cancer Screening Referral: ***  Additional Screening:  Hepatitis C Screening: {DOES NOT does:27190::"does not"} qualify; Completed ***  Vision Screening: Recommended annual ophthalmology exams for early detection of glaucoma and other disorders of the eye. Is the patient up to date with their annual eye exam?  {YES/NO:21197} Who is the provider or what is the name of the office in which the patient attends annual eye exams? *** If pt is not established with a provider, would they like to be referred to a provider to establish care? {YES/NO:21197}.   Dental Screening: Recommended annual  dental exams for proper oral hygiene  Community Resource Referral / Chronic Care Management: CRR required this visit?  {YES/NO:21197}  CCM required this visit?  {YES/NO:21197}     Plan:     I have personally reviewed and noted the following in the patient's chart:   Medical and social history Use of alcohol, tobacco or illicit drugs  Current medications and supplements including opioid prescriptions. {Opioid Prescriptions:[phone]} Functional ability and status Nutritional status Physical activity Advanced directives List of other physicians Hospitalizations, surgeries, and ER visits in previous 12 months Vitals Screenings to include cognitive, depression, and falls Referrals and appointments  In addition, I have reviewed and discussed with patient certain preventive protocols, quality metrics, and best practice recommendations. A written personalized care plan for preventive services as well as general preventive health recommendations were provided to patient.     Colman Cater, [place]   02/13/2022   Nurse Notes: ***

## 2022-02-27 ENCOUNTER — Telehealth: Payer: Self-pay | Admitting: Pharmacist

## 2022-02-27 NOTE — Progress Notes (Signed)
Chronic Care Management Pharmacy Assistant   Name: Chris [name].  MRN: [id number] DOB: February 10, 1941   Reason for Encounter: Disease State - Hypertension Call    Recent office visits:  10/22/21 Jenna Luo, MD - Family Medicine - Hypertension - No notes available.   Recent consult visits:  None noted.  Hospital visits: 10/03/21 - 10/06/21 Medication Reconciliation was completed by comparing discharge summary, patient's EMR and Pharmacy list, and upon discussion with patient.  Admitted to the hospital on 10/03/21 due to Esophageal obstruction due to food impaction. Discharge date was 10/06/21. Discharged from [place]?Medications Started at [facility] Discharge:?? None noted.  Medication Changes at Hospital Discharge: None noted  Medications Discontinued at Hospital Discharge: None noted  Medications that remain the same after Hospital Discharge:??  All other medications will remain the same.     Hospital visits: 09/04/21 - 09/05/21 Medication Reconciliation was completed by comparing discharge summary, patient's EMR and Pharmacy list, and upon discussion with patient.  Admitted to the hospital on 09/04/21 due to NSTEMI. Discharge date was 09/05/21. Discharged from [place]?Medications Started at [facility] Discharge:?? carvedilol (Coreg) clopidogrel (PLAVIX) Start taking on: September 06, 2021 furosemide (Lasix) potassium chloride SA (KLOR-CON M)  Medication Changes at Hospital Discharge: famotidine (PEPCID) pantoprazole (PROTONIX)  Medications Discontinued at Hospital Discharge: Mylanta Maximum Strength 400-400-40 MG/5ML suspension (alum & mag hydroxide-simeth) triamcinolone 0.025 % ointment (KENALOG)  Medications that remain the same after Hospital Discharge:??  All other medications will remain the same.    Medications: Outpatient Encounter Medications as of 02/27/2022  Medication Sig   acetaminophen (TYLENOL) 500 MG tablet  Take 500 mg by mouth every 6 (six) hours as needed for mild pain.   aspirin EC 81 MG tablet Take 1 tablet (81 mg total) by mouth daily.   carvedilol (COREG) 3.125 MG tablet Take 1 tablet (3.125 mg total) by mouth 2 (two) times daily.   clopidogrel (PLAVIX) 75 MG tablet TAKE 1 TABLET EVERY DAY   EPINEPHrine 0.3 mg/0.3 mL IJ SOAJ injection Inject 0.3 mg into the muscle as needed for anaphylaxis.   famotidine (PEPCID) 20 MG tablet Take 20 mg by mouth every evening.   furosemide (LASIX) 20 MG tablet Take 1 tablet (20 mg total) by mouth daily. (Patient not taking: Reported on 10/24/2021)   nitroGLYCERIN (NITROSTAT) 0.4 MG SL tablet Place 1 tablet (0.4 mg total) under the tongue every 5 (five) minutes as needed for chest pain.   pantoprazole (PROTONIX) 40 MG tablet Take 1 tablet (40 mg total) by mouth daily.   rosuvastatin (CRESTOR) 40 MG tablet Take 1 tablet (40 mg total) by mouth daily.   No facility-administered encounter medications on file as of 02/27/2022.    Current antihypertensive regimen:  Valsartan 40 mg 1 tablet daily  Furosemide (LASIX) 20 MG tablet Carvedilol (COREG) 3.125 MG tablet  How often are you checking your Blood Pressure?  Patient reported checking blood pressures  Current home BP readings:     What recent interventions/DTPs have been made by any provider to improve Blood Pressure control since last CPP Visit:  Patient denied any recent changes in medications since last visit with CPP    Any recent hospitalizations or ED visits since last visit with CPP? Patient did have an ED visit on 10/03/21 and ED to hospital admission on 09/04/21   What diet changes have been made to improve Blood Pressure Control?  Patient was placed on Carvedilol  and Furosemide during 09/04/21 hospitalization.    What exercise is being done to improve your Blood Pressure Control?   Patient reported   Adherence Review: Is the patient currently on ACE/ARB medication? Yes Does the patient have >5  day gap between last estimated fill dates? No    Care Gaps   AWV: done 02/07/21 Colonoscopy: unknown  DM Eye Exam: due 02/10/20 DM Foot Exam: due 11/10/19 Microalbumin: done 11/11/18 HbgAIC: done 09/08/21 (6.9) DEXA: N/A Mammogram: N/A    Star Rating Drugs: Valsartan (DIOVAN) 40 MG tablet - last filled 11/26/21 90 days  Rosuvastatin (CRESTOR) 40 MG tablet - last filled 11/26/21 90 days  MetFORMIN (GLUCOPHAGE-XR) 500 MG 24 hr tablet - last filled 01/24/22 90 days     Future Appointments  Date Time Provider [place]  04/24/2022 10:15 AM PCV-ECHO/VAS 1 PCV-IMG None  05/01/2022  1:30 PM Adrian Prows, MD PCV-PCV None   Multiple attempts were made to contact patient. Attempts were unsuccessful. / ls,CMA   Jobe Gibbon, [place] Pharmacist Assistant  [phone]

## 2022-03-03 ENCOUNTER — Telehealth: Payer: Self-pay | Admitting: Pharmacist

## 2022-03-03 NOTE — Progress Notes (Signed)
    Chronic Care Management  Error / duplicate

## 2022-03-27 ENCOUNTER — Other Ambulatory Visit: Payer: Self-pay | Admitting: Family Medicine

## 2022-03-27 ENCOUNTER — Other Ambulatory Visit: Payer: Self-pay

## 2022-03-27 NOTE — Telephone Encounter (Signed)
Requested Prescriptions  Pending Prescriptions Disp Refills   rosuvastatin (CRESTOR) 40 MG tablet 7 tablet 0    Sig: Take 1 tablet (40 mg total) by mouth daily.     Cardiovascular:  Antilipid - Statins 2 Failed - 03/27/2022  3:28 PM      Failed - Cr in normal range and within 360 days    Creat  Date Value Ref Range Status  09/08/2021 1.98 (H) 0.70 - 1.28 mg/dL Final   Creatinine, Ser  Date Value Ref Range Status  10/03/2021 2.02 (H) 0.61 - 1.24 mg/dL Final         Failed - Lipid Panel in normal range within the last 12 months    Cholesterol, Total  Date Value Ref Range Status  05/15/2020 183 100 - 199 mg/dL Final   Cholesterol  Date Value Ref Range Status  08/13/2020 110 0 - 200 mg/dL Final   LDL Cholesterol (Calc)  Date Value Ref Range Status  08/01/2019 69 mg/dL (calc) Final    Comment:    Reference range: <100 . Desirable range <100 mg/dL for primary prevention;   <70 mg/dL for patients with CHD or diabetic patients  with > or = 2 CHD risk factors. [place] Kitchen LDL-C is now calculated using the Martin-Hopkins  calculation, which is a validated novel method providing  better accuracy than the Friedewald equation in the  estimation of LDL-C.  Cresenciano Genre et al. Annamaria Helling. 6063;016(01): 2061-2068  (http://education.[employer].com/faq/FAQ164)    LDL Chol Calc (NIH)  Date Value Ref Range Status  05/15/2020 87 0 - 99 mg/dL Final   LDL Cholesterol  Date Value Ref Range Status  08/13/2020 29 0 - 99 mg/dL Final    Comment:           Total Cholesterol/HDL:CHD Risk Coronary Heart Disease Risk Table                     Men   Women  1/2 Average Risk   3.4   3.3  Average Risk       5.0   4.4  2 X Average Risk   9.6   7.1  3 X Average Risk  23.4   11.0        Use the calculated Patient Ratio above and the CHD Risk Table to determine the patient's CHD Risk.        ATP III CLASSIFICATION (LDL):  <100     mg/dL   Optimal  100-129  mg/dL   Near or Above                     Optimal  130-159  mg/dL   Borderline  160-189  mg/dL   High  >190     mg/dL   Very High Performed at [facility] [address] [place], [place] [address]    Direct LDL  Date Value Ref Range Status  08/01/2019 58 <100 mg/dL Final    Comment:    . Desirable range <100 mg/dL for primary prevention;   <70 mg/dL for patients with CHD or diabetic patients  with > or = 2 CHD risk factors. Marland Kitchen    HDL  Date Value Ref Range Status  08/13/2020 35 (L) >40 mg/dL Final  05/15/2020 40 >39 mg/dL Final   Triglycerides  Date Value Ref Range Status  08/13/2020 230 (H) <150 mg/dL Final         Passed - Patient is not pregnant  Passed - Valid encounter within last 12 months    Recent Outpatient Visits           6 months ago ASCVD (arteriosclerotic cardiovascular disease)   [place] [name], [name], MD   10 months ago Hiatal hernia   [place] [name], [name], MD   1 year ago Allergy injection reaction, initial encounter   [place] [facility] [name], MD   1 year ago Left ureteral stone   [place] Susy Frizzle, MD   1 year ago Interstitial pulmonary disease ([place])   [place] [name], [name], MD       Future Appointments             In 1 month Adrian Prows, MD [facility]

## 2022-03-27 NOTE — Telephone Encounter (Signed)
  Prescription Request  03/27/2022  Pharmacy requesting new prescription.  Is this a "Controlled Substance" medicine? No  LOV: 03/27/2022   What is the name of the medication or equipment?   rosuvastatin (CRESTOR) 40 MG tablet   Have you contacted your pharmacy to request a refill? Yes   Which pharmacy would you like this sent to?   [facility], [address], [place] [address] Phone: [phone]  Fax: [phone]     Patient notified that their request is being sent to the clinical staff for review and that they should receive a response within 2 business days.   Please advise pharmacist.

## 2022-03-27 NOTE — Telephone Encounter (Signed)
  Prescription Request  03/27/2022  Is this a "Controlled Substance" medicine? No  LOV: 03/03/2022   What is the name of the medication or equipment? rosuvastatin (CRESTOR) 40 MG tablet [[id number]]   Have you contacted your pharmacy to request a refill? Yes   Which pharmacy would you like this sent to?   [facility], [place]   Patient notified that their request is being sent to the clinical staff for review and that they should receive a response within 2 business days.   Please advise at [phone]

## 2022-03-27 NOTE — Telephone Encounter (Signed)
Requested Prescriptions  Pending Prescriptions Disp Refills   rosuvastatin (CRESTOR) 40 MG tablet 7 tablet 0    Sig: Take 1 tablet (40 mg total) by mouth daily.     Cardiovascular:  Antilipid - Statins 2 Failed - 03/27/2022  4:05 PM      Failed - Cr in normal range and within 360 days    Creat  Date Value Ref Range Status  09/08/2021 1.98 (H) 0.70 - 1.28 mg/dL Final   Creatinine, Ser  Date Value Ref Range Status  10/03/2021 2.02 (H) 0.61 - 1.24 mg/dL Final         Failed - Lipid Panel in normal range within the last 12 months    Cholesterol, Total  Date Value Ref Range Status  05/15/2020 183 100 - 199 mg/dL Final   Cholesterol  Date Value Ref Range Status  08/13/2020 110 0 - 200 mg/dL Final   LDL Cholesterol (Calc)  Date Value Ref Range Status  08/01/2019 69 mg/dL (calc) Final    Comment:    Reference range: <100 . Desirable range <100 mg/dL for primary prevention;   <70 mg/dL for patients with CHD or diabetic patients  with > or = 2 CHD risk factors. [place] Kitchen LDL-C is now calculated using the Martin-Hopkins  calculation, which is a validated novel method providing  better accuracy than the Friedewald equation in the  estimation of LDL-C.  Cresenciano Genre et al. Annamaria Helling. 2536;644(03): 2061-2068  (http://education.[facility].com/faq/FAQ164)    LDL Chol Calc (NIH)  Date Value Ref Range Status  05/15/2020 87 0 - 99 mg/dL Final   LDL Cholesterol  Date Value Ref Range Status  08/13/2020 29 0 - 99 mg/dL Final    Comment:           Total Cholesterol/HDL:CHD Risk Coronary Heart Disease Risk Table                     Men   Women  1/2 Average Risk   3.4   3.3  Average Risk       5.0   4.4  2 X Average Risk   9.6   7.1  3 X Average Risk  23.4   11.0        Use the calculated Patient Ratio above and the CHD Risk Table to determine the patient's CHD Risk.        ATP III CLASSIFICATION (LDL):  <100     mg/dL   Optimal  100-129  mg/dL   Near or Above                     Optimal  130-159  mg/dL   Borderline  160-189  mg/dL   High  >190     mg/dL   Very High Performed at [facility] [address] [place], [place] [address]    Direct LDL  Date Value Ref Range Status  08/01/2019 58 <100 mg/dL Final    Comment:    . Desirable range <100 mg/dL for primary prevention;   <70 mg/dL for patients with CHD or diabetic patients  with > or = 2 CHD risk factors. Marland Kitchen    HDL  Date Value Ref Range Status  08/13/2020 35 (L) >40 mg/dL Final  05/15/2020 40 >39 mg/dL Final   Triglycerides  Date Value Ref Range Status  08/13/2020 230 (H) <150 mg/dL Final         Passed - Patient is not pregnant  Passed - Valid encounter within last 12 months    Recent Outpatient Visits           6 months ago ASCVD (arteriosclerotic cardiovascular disease)   [place] [name], [name], MD   10 months ago Hiatal hernia   [place] [name], [name], MD   1 year ago Allergy injection reaction, initial encounter   [place] Dennard Schaumann [name], MD   1 year ago Left ureteral stone   [place] Susy Frizzle, MD   1 year ago Interstitial pulmonary disease ([place])   [place] [name], [name], MD       Future Appointments             In 1 month Adrian Prows, MD [facility]

## 2022-03-28 ENCOUNTER — Other Ambulatory Visit: Payer: Self-pay | Admitting: Cardiology

## 2022-03-30 ENCOUNTER — Other Ambulatory Visit: Payer: Self-pay

## 2022-03-30 DIAGNOSIS — I25110 Atherosclerotic heart disease of native coronary artery with unstable angina pectoris: Secondary | ICD-10-CM

## 2022-03-30 DIAGNOSIS — I6521 Occlusion and stenosis of right carotid artery: Secondary | ICD-10-CM

## 2022-03-30 DIAGNOSIS — I214 Non-ST elevation (NSTEMI) myocardial infarction: Secondary | ICD-10-CM

## 2022-03-30 MED ORDER — ROSUVASTATIN CALCIUM 40 MG PO TABS: 40.0000 mg | ORAL_TABLET | Freq: Every day | ORAL | 0 refills | Status: DC

## 2022-04-02 ENCOUNTER — Other Ambulatory Visit: Payer: Self-pay | Admitting: Family Medicine

## 2022-04-02 DIAGNOSIS — I214 Non-ST elevation (NSTEMI) myocardial infarction: Secondary | ICD-10-CM

## 2022-04-02 DIAGNOSIS — I6521 Occlusion and stenosis of right carotid artery: Secondary | ICD-10-CM

## 2022-04-02 DIAGNOSIS — I25110 Atherosclerotic heart disease of native coronary artery with unstable angina pectoris: Secondary | ICD-10-CM

## 2022-04-02 NOTE — Telephone Encounter (Signed)
Duplicate Rx- 45/8/09 #[id number] verified received at pharmacy Requested Prescriptions  Pending Prescriptions Disp Refills   rosuvastatin (CRESTOR) 40 MG tablet 30 tablet 0    Sig: Take 1 tablet (40 mg total) by mouth daily.     Cardiovascular:  Antilipid - Statins 2 Failed - 04/02/2022 10:26 AM      Failed - Cr in normal range and within 360 days    Creat  Date Value Ref Range Status  09/08/2021 1.98 (H) 0.70 - 1.28 mg/dL Final   Creatinine, Ser  Date Value Ref Range Status  10/03/2021 2.02 (H) 0.61 - 1.24 mg/dL Final         Failed - Lipid Panel in normal range within the last 12 months    Cholesterol, Total  Date Value Ref Range Status  05/15/2020 183 100 - 199 mg/dL Final   Cholesterol  Date Value Ref Range Status  08/13/2020 110 0 - 200 mg/dL Final   LDL Cholesterol (Calc)  Date Value Ref Range Status  08/01/2019 69 mg/dL (calc) Final    Comment:    Reference range: <100 . Desirable range <100 mg/dL for primary prevention;   <70 mg/dL for patients with CHD or diabetic patients  with > or = 2 CHD risk factors. Mar[place] Kitchen LDL-C is now calculated using the Martin-Hopkins  calculation, which is a validated novel method providing  better accuracy than the Friedewald equation in the  estimation of LDL-C.  Cresenciano Genre et al. Annamaria Helling. 3382;505(39): 2061-2068  (http://education.[facility].com/faq/FAQ164)    LDL Chol Calc (NIH)  Date Value Ref Range Status  05/15/2020 87 0 - 99 mg/dL Final   LDL Cholesterol  Date Value Ref Range Status  08/13/2020 29 0 - 99 mg/dL Final    Comment:           Total Cholesterol/HDL:CHD Risk Coronary Heart Disease Risk Table                     Men   Women  1/2 Average Risk   3.4   3.3  Average Risk       5.0   4.4  2 X Average Risk   9.6   7.1  3 X Average Risk  23.4   11.0        Use the calculated Patient Ratio above and the CHD Risk Table to determine the patient's CHD Risk.        ATP III CLASSIFICATION (LDL):  <100     mg/dL    Optimal  100-129  mg/dL   Near or Above                    Optimal  130-159  mg/dL   Borderline  160-189  mg/dL   High  >190     mg/dL   Very High Performed at [facility] [address], [place], [place] [address]    Direct LDL  Date Value Ref Range Status  08/01/2019 58 <100 mg/dL Final    Comment:    . Desirable range <100 mg/dL for primary prevention;   <70 mg/dL for patients with CHD or diabetic patients  with > or = 2 CHD risk factors. Marland Kitchen    HDL  Date Value Ref Range Status  08/13/2020 35 (L) >40 mg/dL Final  05/15/2020 40 >39 mg/dL Final   Triglycerides  Date Value Ref Range Status  08/13/2020 230 (H) <150 mg/dL Final         Passed -  Patient is not pregnant      Passed - Valid encounter within last 12 months    Recent Outpatient Visits           6 months ago ASCVD (arteriosclerotic cardiovascular disease)   [place] [name], [name], MD   10 months ago Hiatal hernia   [place] [name], [name], MD   1 year ago Allergy injection reaction, initial encounter   [place] Dennard Schaumann [name], MD   1 year ago Left ureteral stone   [place] Susy Frizzle, MD   1 year ago Interstitial pulmonary disease ([place])   [place] [name], [name], MD       Future Appointments             In 4 days [name], [name], MD [place], [place]   In 4 weeks Adrian Prows, MD [facility]

## 2022-04-02 NOTE — Telephone Encounter (Signed)
  Prescription Request  04/02/2022  Is this a "Controlled Substance" medicine? No  LOV: 03/27/2022   What is the name of the medication or equipment?   rosuvastatin (CRESTOR) 40 MG tablet [[id number]]   Have you contacted your pharmacy to request a refill? Yes   Which pharmacy would you like this sent to?  [facility], [address] [place] [address] Phone: [phone] Fax: [phone]   Patient notified that their request is being sent to the clinical staff for review and that they should receive a response within 2 business days.   **PHARMACY REQUESTING NEW SCRIPT**  Please advise pharmacist.

## 2022-04-06 ENCOUNTER — Ambulatory Visit (INDEPENDENT_AMBULATORY_CARE_PROVIDER_SITE_OTHER): Payer: Medicare HMO | Admitting: Family Medicine

## 2022-04-06 VITALS — BP 128/82 | HR 84 | Resp 16 | Ht 68.0 in | Wt 229.0 lb

## 2022-04-06 DIAGNOSIS — M20021 Boutonniere deformity of right finger(s): Secondary | ICD-10-CM

## 2022-04-06 DIAGNOSIS — I214 Non-ST elevation (NSTEMI) myocardial infarction: Secondary | ICD-10-CM

## 2022-04-06 DIAGNOSIS — E1159 Type 2 diabetes mellitus with other circulatory complications: Secondary | ICD-10-CM

## 2022-04-06 DIAGNOSIS — I6521 Occlusion and stenosis of right carotid artery: Secondary | ICD-10-CM | POA: Diagnosis not present

## 2022-04-06 DIAGNOSIS — I5042 Chronic combined systolic (congestive) and diastolic (congestive) heart failure: Secondary | ICD-10-CM

## 2022-04-06 DIAGNOSIS — J8410 Pulmonary fibrosis, unspecified: Secondary | ICD-10-CM | POA: Diagnosis not present

## 2022-04-06 DIAGNOSIS — I25110 Atherosclerotic heart disease of native coronary artery with unstable angina pectoris: Secondary | ICD-10-CM | POA: Diagnosis not present

## 2022-04-06 DIAGNOSIS — K469 Unspecified abdominal hernia without obstruction or gangrene: Secondary | ICD-10-CM

## 2022-04-06 DIAGNOSIS — Z91018 Allergy to other foods: Secondary | ICD-10-CM

## 2022-04-06 MED ORDER — EPINEPHRINE 0.3 MG/0.3ML IJ SOAJ: 0.3000 mg | INTRAMUSCULAR | 0 refills | Status: DC | PRN

## 2022-04-06 MED ORDER — CLOPIDOGREL BISULFATE 75 MG PO TABS: 75.0000 mg | ORAL_TABLET | Freq: Every day | ORAL | 2 refills | Status: DC

## 2022-04-06 MED ORDER — PANTOPRAZOLE SODIUM 40 MG PO TBEC: 40.0000 mg | DELAYED_RELEASE_TABLET | Freq: Every day | ORAL | 0 refills | Status: DC

## 2022-04-06 MED ORDER — ROSUVASTATIN CALCIUM 40 MG PO TABS: 40.0000 mg | ORAL_TABLET | Freq: Every day | ORAL | 0 refills | Status: DC

## 2022-04-06 MED ORDER — FUROSEMIDE 20 MG PO TABS: 20.0000 mg | ORAL_TABLET | Freq: Every day | ORAL | 11 refills | Status: DC

## 2022-04-06 MED ORDER — METFORMIN HCL ER 500 MG PO TB24: 500.0000 mg | ORAL_TABLET | Freq: Two times a day (BID) | ORAL | 3 refills | Status: DC

## 2022-04-06 MED ORDER — NITROGLYCERIN 0.4 MG SL SUBL: 0.4000 mg | SUBLINGUAL_TABLET | SUBLINGUAL | 3 refills | Status: AC | PRN

## 2022-04-06 MED ORDER — CARVEDILOL 3.125 MG PO TABS: 3.1250 mg | ORAL_TABLET | Freq: Two times a day (BID) | ORAL | 3 refills | Status: DC

## 2022-04-06 NOTE — Progress Notes (Signed)
Subjective:    Patient ID: Chris [name]., male    DOB: [birth year], 81 y.o.   MRN: [id number]   Patient is a 81 y/o WM with a history of hypertension, hyperlipidemia, diabetes mellitus, CAD,hx of MI with CABG S/P multiple coronary interventions,  H/O stroke in past without residual deficits, chronic systolic and diastolic CHF with EF 02-63% and asymptomatic carotid artery stenosis who presents for routine follow up.  Since I last saw the patient, he has stopped Entresto.  He also apparently has stopped carvedilol.  He has no recollection of why he stopped carvedilol despite his history of heart disease and congestive heart failure.  I gave him Wilder Glade at our last visit which she stopped due to cost.  He is currently only taking metformin for his sugars.  He is not checking his blood sugar.  He has 2 specific concerns today.  First he reports a cough productive of mucus on a daily basis.  He is taking famotidine and pantoprazole for his hiatal hernia but he has untreated pulmonary fibrosis.  On pulmonary exam today he has bibasilar crackles that are prominent and I suspect that decreased vital capacity is likely contributing to his inability to clear the mucus from his airways.  He has not followed up with pulmonology.  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  He states that he fell and injured his hand a few weeks ago and he is unable to extend his DIP joint on the fourth digit   Past Medical History:  Diagnosis Date   Abnormal exercise myocardial perfusion study    03/2018- 38% ef, see report   Arthritis    Chronic kidney disease    Congestive heart failure (CHF) ([place]) 09/2015   EF 35-40% [name]   Coronary artery disease    a. 1995 s/p CABG x 3 (VG->RCA, LIMA->LAD, VG->OM);  b. 07/2008 Inf MI/Cath/PCI: VG->RCA 99 - treated with Taxus DES (68m), LIMA and VG->OM patent, LAD 100, LCX 100, RCA 60d, EF 50%;  c. 09/2008 PCI native RCA  w/ 2.5x23 Xience  DES, VG->RCA stent patent;  c. 05/2010 Cath: Native 3VD with 3/3 patent grafts, native RCA w 80% ISR prox to graft insertion-->Med Rx.   DOE (dyspnea on exertion)    ED (erectile dysfunction)    GERD (gastroesophageal reflux disease)    occ   Gout    H/O hiatal hernia    Hyperlipidemia    Hypertension    [place]    Neuropathy    toes   Obesity    RBBB    RBBB (right bundle branch block)    Stenosis of right internal carotid artery    50-69% (2014)   Type II diabetes mellitus ([place]    Umbilical hernia    a. s/p repair.   Vertigo     Past Surgical History:  Procedure Laterality Date   ANKLE ARTHROSCOPY WITH DRILLING/MICROFRACTURE Left 04/13/2013   Procedure: LEFT ANKLE ARTHROSCOPY WITH EXTENSIVE DEBRIEDMENT;  Surgeon: JWylene Simmer MD;  [facility]  Service: Orthopedics;  Laterality: Left;   ANKLE SURGERY Left 08/18/2013   DR HEWITT   CARDIAC CATHETERIZATION  2000   stents x3   CARDIAC CATHETERIZATION  02/15/2014   Procedure: LEFT HEART CATH AND CORS/GRAFTS ANGIOGRAPHY;  Surgeon: JLaverda Page MD;  [facility];  Service: Cardiovascular;;   COLONOSCOPY     CORONARY ARTERY BYPASS GRAFT  1995   LIMA to LAD, SVG to RCA, SVG to OM.    CORONARY STENT INTERVENTION N/A 08/12/2020   Procedure: CORONARY STENT INTERVENTION;  Surgeon: Nigel Mormon, MD;  Location: [facility];  Service: Cardiovascular;  Laterality: N/A;   CORONARY STENT INTERVENTION N/A 09/04/2021   Procedure: CORONARY STENT INTERVENTION;  Surgeon: Adrian Prows, MD;  Location: [facility];  Service: Cardiovascular;  Laterality: N/A;   LEFT HEART CATH AND CORS/GRAFTS ANGIOGRAPHY N/A 08/12/2020   Procedure: LEFT HEART CATH AND CORS/GRAFTS ANGIOGRAPHY;  Surgeon: Nigel Mormon, MD;  Location: [facility];  Service: Cardiovascular;  Laterality: N/A;   LEFT HEART CATH AND CORS/GRAFTS ANGIOGRAPHY N/A 09/04/2021   Procedure: LEFT HEART CATH AND  CORS/GRAFTS ANGIOGRAPHY;  Surgeon: Adrian Prows, MD;  Location: [facility];  Service: Cardiovascular;  Laterality: N/A;   TOTAL ANKLE ARTHROPLASTY Left 08/17/2013   Procedure: LEFT TOTAL ANKLE REPLACEMENT WITH POSSIBLE GASTROC RECESSION ;  Surgeon: Wylene Simmer, MD;  Location: [facility];  Service: Orthopedics;  Laterality: Left;   UMBILICAL HERNIA REPAIR  12/2007   Current Outpatient Medications on File Prior to Visit  Medication Sig Dispense Refill   acetaminophen (TYLENOL) 500 MG tablet Take 500 mg by mouth every 6 (six) hours as needed for mild pain.     aspirin EC 81 MG tablet Take 1 tablet (81 mg total) by mouth daily.     carvedilol (COREG) 3.125 MG tablet Take 1 tablet (3.125 mg total) by mouth 2 (two) times daily. 60 tablet 1   clopidogrel (PLAVIX) 75 MG tablet TAKE 1 TABLET EVERY DAY 90 tablet 2   EPINEPHrine 0.3 mg/0.3 mL IJ SOAJ injection Inject 0.3 mg into the muscle as needed for anaphylaxis. 1 each 0   famotidine (PEPCID) 20 MG tablet Take 20 mg by mouth every evening.     furosemide (LASIX) 20 MG tablet Take 1 tablet (20 mg total) by mouth daily. (Patient not taking: Reported on 10/24/2021) 30 tablet 11   metFORMIN (GLUCOPHAGE-XR) 500 MG 24 hr tablet Take 500 mg by mouth 2 (two) times daily.     nitroGLYCERIN (NITROSTAT) 0.4 MG SL tablet Place 1 tablet (0.4 mg total) under the tongue every 5 (five) minutes as needed for chest pain. 15 tablet 3   pantoprazole (PROTONIX) 40 MG tablet Take 1 tablet (40 mg total) by mouth daily. 90 tablet 3   rosuvastatin (CRESTOR) 40 MG tablet Take 1 tablet (40 mg total) by mouth daily. 30 tablet 0   No current facility-administered medications on file prior to visit.   Allergies  Allergen Reactions   Brilinta [Ticagrelor] Shortness Of Breath   Alpha-Gal Hives   Crestor [Rosuvastatin Calcium] Other (See Comments)    Muscle pain- tolerating this 2022, however   Lipitor [Atorvastatin] Other (See Comments)    Intolerable muscle pain all over     Social History   Socioeconomic History   Marital status: Married    Spouse name: Not on file   Number of children: 4   Years of education: Not on file   Highest education level: Not on file  Occupational History   Not on file  Tobacco Use   Smoking status: Former    Packs/day: 1.00    Years: 9.00    Total pack years: 9.00    Types: Cigarettes    Quit date: 10/31/1970    Years since quitting: 51.4   Smokeless tobacco: Former    Types: Loss adjuster, chartered  Tobacco comments:    chewed for 2 years in his 20's  Vaping Use   Vaping Use: Never used  Substance and Sexual Activity   Alcohol use: Not Currently    Comment: occ wine last 6 months   Drug use: No   Sexual activity: Yes  Other Topics Concern   Not on file  Social History Narrative   Lives in [place] with wife.  Retired.   Social Determinants of Health   Financial Resource Strain: Low Risk  (02/07/2021)   Overall Financial Resource Strain (CARDIA)    Difficulty of Paying Living Expenses: Not hard at all  Food Insecurity: No Food Insecurity (02/07/2021)   Hunger Vital Sign    Worried About Running Out of Food in the Last Year: Never true    Ran Out of Food in the Last Year: Never true  Transportation Needs: No Transportation Needs (02/07/2021)   PRAPARE - Hydrologist (Medical): No    Lack of Transportation (Non-Medical): No  Physical Activity: Sufficiently Active (02/07/2021)   Exercise Vital Sign    Days of Exercise per Week: 5 days    Minutes of Exercise per Session: 30 min  Stress: Stress Concern Present (02/07/2021)   [place]    Feeling of Stress : To some extent  Social Connections: Socially Integrated (02/07/2021)   Social Connection and Isolation Panel [NHANES]    Frequency of Communication with Friends and Family: More than three times a week    Frequency of Social Gatherings with Friends and Family: More than three  times a week    Attends Religious Services: More than 4 times per year    Active Member of [employer] or Organizations: Yes    Attends Archivist Meetings: More than 4 times per year    Marital Status: Married  Human resources officer Violence: Not At Risk (02/07/2021)   Humiliation, Afraid, Rape, and Kick questionnaire    Fear of Current or Ex-Partner: No    Emotionally Abused: No    Physically Abused: No    Sexually Abused: No     Review of Systems  All other systems reviewed and are negative.      Objective:   Physical Exam Vitals reviewed.  Constitutional:      General: He is not in acute distress.    Appearance: He is obese. He is not ill-appearing or toxic-appearing.  Cardiovascular:     Rate and Rhythm: Normal rate and regular rhythm.     Heart sounds: No murmur heard.    No friction rub. No gallop.  Pulmonary:     Effort: Pulmonary effort is normal. No respiratory distress.     Breath sounds: No stridor. Rales present. No wheezing or rhonchi.  Abdominal:     General: Abdomen is flat. Bowel sounds are normal. There is no distension.     Palpations: Abdomen is soft.     Tenderness: There is no abdominal tenderness. There is no guarding.  Musculoskeletal:     Right lower leg: No edema.     Left lower leg: No edema.  Skin:    Findings: No erythema or rash.  Neurological:     Mental Status: He is alert.           Assessment & Plan:  Coronary artery disease involving native coronary artery of native heart with unstable angina pectoris ([employer])  Stenosis of right internal carotid artery  Chronic combined systolic  and diastolic CHF (congestive heart failure) ([place])  Type 2 diabetes mellitus with other circulatory complication, without long-term current use of insulin ([place]) - Plan: CBC with Differential/Platelet, COMPLETE METABOLIC PANEL WITH GFR, Hemoglobin A1c, Lipid panel, Protein / Creatinine Ratio, Urine  Pulmonary fibrosis ([place]) - Plan: Ambulatory referral to  Pulmonology  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  He is not on maximum therapy for heart failure or diabetes due to his personal decision not to take the medication.  I emphasized for the patient the importance of taking the carvedilol.  I see no reason not to use this medicine as it is an expensive so I did send back in the carvedilol due to his history of heart failure and coronary artery disease.  I will check a fasting lipid panel.  His goal LDL cholesterol is less than 70.  I will check an A1c.  If his A1c is greater than 6.5 I would strongly suggest Jardiance to reduce his long-term morbidity and mortality.  I will consult pulmonology for his pulmonary fibrosis as I do not see any other option to try to help manage his chronic cough.  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  I explained to the patient that this is likely a torn extensor tendon and will require surgery to correct.  Diabetic foot exam was performed today and shows significant neuropathy in both feet as he is unable to feel the 10 g monofilament bilaterally

## 2022-04-07 ENCOUNTER — Telehealth: Payer: Self-pay

## 2022-04-07 NOTE — Telephone Encounter (Signed)
Pt called to let you know that the reason he stopped his Carvedilol was because he was having issues with dizziness and was falling.

## 2022-04-08 LAB — LIPID PANEL
Cholesterol: 135 mg/dL (ref <200)
HDL: 46 mg/dL (ref >=40)
LDL Cholesterol (Calc): 59 mg/dL (calc)
Non-HDL Cholesterol (Calc): 89 mg/dL (calc) (ref <130)
Total CHOL/HDL Ratio: 2.9 (calc) (ref <5.0)
Triglycerides: 237 mg/dL — ABNORMAL HIGH (ref <150)

## 2022-04-08 LAB — HEMOGLOBIN A1C
Hgb A1c MFr Bld: 6.8 %{Hb} — ABNORMAL HIGH
Mean Plasma Glucose: 148 mg/dL
eAG (mmol/L): 8.2 mmol/L

## 2022-04-08 LAB — CBC WITH DIFFERENTIAL/PLATELET
Absolute Monocytes: 607 cells/uL (ref 200–950)
Basophils Absolute: 41 cells/uL (ref 0–200)
Basophils Relative: 0.6 %
Eosinophils Absolute: 421 cells/uL (ref 15–500)
Eosinophils Relative: 6.1 %
HCT: 36.5 % — ABNORMAL LOW (ref 38.5–50.0)
Hemoglobin: 11.2 g/dL — ABNORMAL LOW (ref 13.2–17.1)
Lymphs Abs: 2491 cells/uL (ref 850–3900)
MCH: 23.1 pg — ABNORMAL LOW (ref 27.0–33.0)
MCHC: 30.7 g/dL — ABNORMAL LOW (ref 32.0–36.0)
MCV: 75.4 fL — ABNORMAL LOW (ref 80.0–100.0)
MPV: 9.2 fL (ref 7.5–12.5)
Monocytes Relative: 8.8 %
Neutro Abs: 3340 cells/uL (ref 1500–7800)
Neutrophils Relative %: 48.4 %
Platelets: 362 10*3/uL (ref 140–400)
RBC: 4.84 10*6/uL (ref 4.20–5.80)
RDW: 18.3 % — ABNORMAL HIGH (ref 11.0–15.0)
Total Lymphocyte: 36.1 %
WBC: 6.9 10*3/uL (ref 3.8–10.8)

## 2022-04-08 LAB — COMPLETE METABOLIC PANEL WITH GFR
AG Ratio: 1 (calc) (ref 1.0–2.5)
ALT: 10 U/L (ref 9–46)
AST: 19 U/L (ref 10–35)
Albumin: 4.1 g/dL (ref 3.6–5.1)
Alkaline phosphatase (APISO): 48 U/L (ref 35–144)
BUN: 12 mg/dL (ref 7–25)
CO2: 24 mmol/L (ref 20–32)
Calcium: 9.5 mg/dL (ref 8.6–10.3)
Chloride: 104 mmol/L (ref 98–110)
Creat: 1.2 mg/dL (ref 0.70–1.28)
Globulin: 4 g/dL (calc) — ABNORMAL HIGH (ref 1.9–3.7)
Glucose, Bld: 112 mg/dL — ABNORMAL HIGH (ref 65–99)
Potassium: 4.4 mmol/L (ref 3.5–5.3)
Sodium: 140 mmol/L (ref 135–146)
Total Bilirubin: 0.5 mg/dL (ref 0.2–1.2)
Total Protein: 8.1 g/dL (ref 6.1–8.1)
eGFR: 62 mL/min/{1.73_m2} (ref >=60)

## 2022-04-08 LAB — PROTEIN / CREATININE RATIO, URINE

## 2022-04-10 ENCOUNTER — Other Ambulatory Visit: Payer: Self-pay

## 2022-04-10 MED ORDER — EMPAGLIFLOZIN 25 MG PO TABS: 25.0000 mg | ORAL_TABLET | Freq: Every day | ORAL | 1 refills | Status: DC

## 2022-04-15 DIAGNOSIS — M20011 Mallet finger of right finger(s): Secondary | ICD-10-CM | POA: Diagnosis not present

## 2022-04-15 DIAGNOSIS — M79644 Pain in right finger(s): Secondary | ICD-10-CM | POA: Diagnosis not present

## 2022-04-15 DIAGNOSIS — M25641 Stiffness of right hand, not elsewhere classified: Secondary | ICD-10-CM | POA: Diagnosis not present

## 2022-04-15 IMAGING — DX DG CHEST 1V PORT
1 series · 1 of 1 positions shown · non-contrast
Comparison: Radiograph 08/12/2020, CT 10/03/2019

CLINICAL DATA: Chest pain and pressure.

EXAM:
PORTABLE CHEST 1 VIEW

[chest]
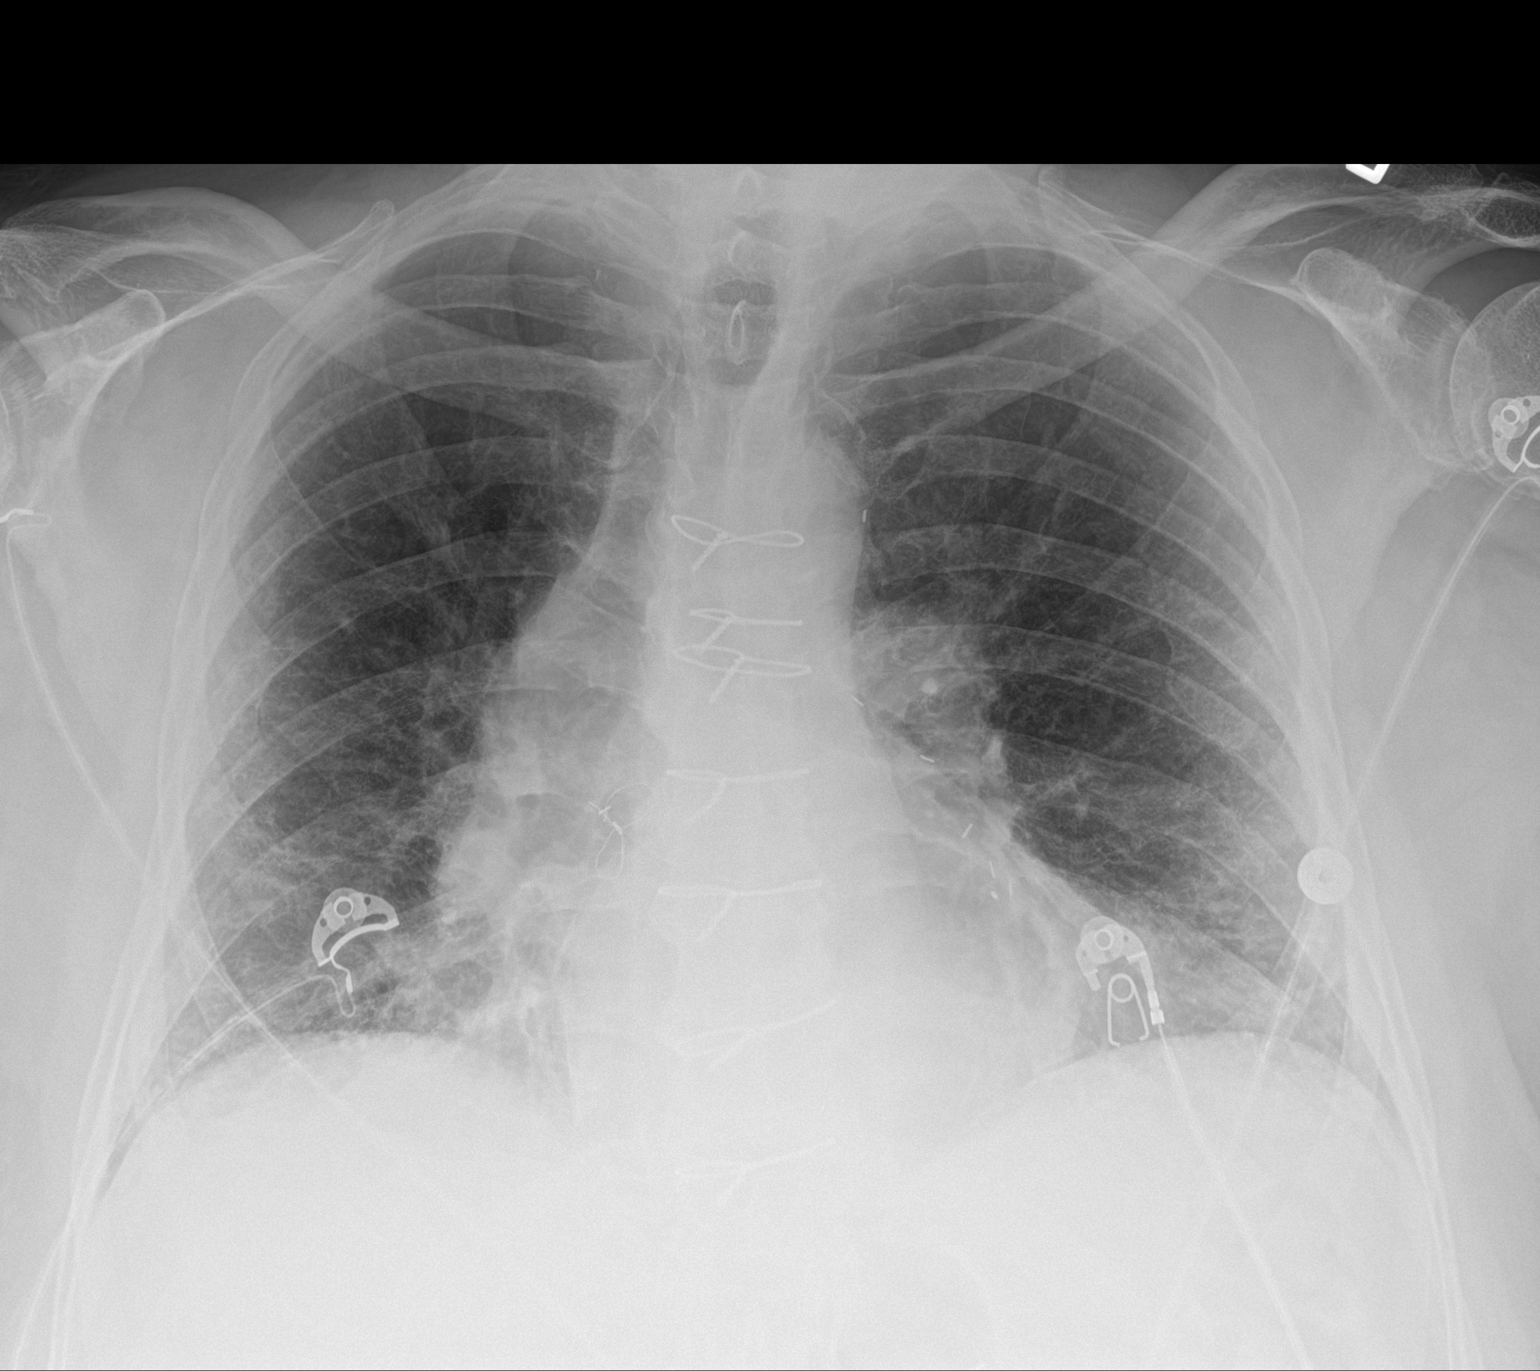

[1 of 1 positions shown; findings below may reference images not displayed]

FINDINGS: Post median sternotomy and CABG. Stable mild cardiomegaly. Unchanged
mediastinal contours with mild bilateral hilar prominence.
Retrocardiac hiatal hernia is again seen. Subpleural reticulation
and interstitial coarsening, corresponding to fibrosis on prior
chest CT. No confluent consolidation, large pleural effusion or
pneumothorax.
IMPRESSION: 1. No acute chest findings.
2. Stable mild cardiomegaly. Chronic interstitial lung disease.
3. Hiatal hernia.

## 2022-04-24 ENCOUNTER — Ambulatory Visit: Payer: Medicare HMO

## 2022-04-24 DIAGNOSIS — I6523 Occlusion and stenosis of bilateral carotid arteries: Secondary | ICD-10-CM

## 2022-04-27 ENCOUNTER — Other Ambulatory Visit: Payer: Medicare HMO

## 2022-04-27 ENCOUNTER — Telehealth: Payer: Self-pay

## 2022-04-27 NOTE — Telephone Encounter (Signed)
Pt states he started the Carvediol 3.125 mg and took x 3-4 days that caused him to be very dizzy and fatigue. Pt states he stopped the medication 2 days ago.   Pt states he has neuropathy in both feet and asks what can he do to help it? Pt states he is having pain in his feet at night that is causing him not to be able to sleep.

## 2022-04-28 ENCOUNTER — Other Ambulatory Visit: Payer: Self-pay

## 2022-04-28 DIAGNOSIS — E1159 Type 2 diabetes mellitus with other circulatory complications: Secondary | ICD-10-CM

## 2022-04-28 MED ORDER — GABAPENTIN 300 MG PO CAPS: 300.0000 mg | ORAL_CAPSULE | Freq: Every day | ORAL | 1 refills | Status: DC

## 2022-05-01 ENCOUNTER — Ambulatory Visit: Payer: Medicare HMO | Admitting: Cardiology

## 2022-05-01 ENCOUNTER — Encounter: Payer: Self-pay | Admitting: Cardiology

## 2022-05-01 VITALS — BP 132/85 | HR 71 | Resp 16 | Ht 68.0 in | Wt 228.0 lb

## 2022-05-01 DIAGNOSIS — E782 Mixed hyperlipidemia: Secondary | ICD-10-CM | POA: Diagnosis not present

## 2022-05-01 DIAGNOSIS — N1832 Chronic kidney disease, stage 3b: Secondary | ICD-10-CM | POA: Diagnosis not present

## 2022-05-01 DIAGNOSIS — I6523 Occlusion and stenosis of bilateral carotid arteries: Secondary | ICD-10-CM

## 2022-05-01 DIAGNOSIS — I5032 Chronic diastolic (congestive) heart failure: Secondary | ICD-10-CM | POA: Diagnosis not present

## 2022-05-01 DIAGNOSIS — I25118 Atherosclerotic heart disease of native coronary artery with other forms of angina pectoris: Secondary | ICD-10-CM | POA: Diagnosis not present

## 2022-05-01 DIAGNOSIS — E1122 Type 2 diabetes mellitus with diabetic chronic kidney disease: Secondary | ICD-10-CM | POA: Diagnosis not present

## 2022-05-01 MED ORDER — SEMAGLUTIDE (1 MG/DOSE) 4 MG/3ML ~~LOC~~ SOPN: 1.0000 mg | PEN_INJECTOR | SUBCUTANEOUS | 1 refills | Status: DC

## 2022-05-01 MED ORDER — SEMAGLUTIDE(0.25 OR 0.5MG/DOS) 2 MG/1.5ML ~~LOC~~ SOPN
0.2500 mg | PEN_INJECTOR | Freq: Once | SUBCUTANEOUS | Status: AC
  Administered 2022-05-01: 0.25 mg via SUBCUTANEOUS

## 2022-05-01 NOTE — Progress Notes (Unsigned)
Primary Physician/Referring:  Susy Frizzle, MD  Patient ID: Chris Reasons., male    DOB: 17-Apr-1941, 81 y.o.   MRN: [id number]  Chief Complaint  Patient presents with   Coronary Artery Disease   Follow-up    6 month    HPI:    Chris [name].  is a 81 y.o. Caucasian male patient with hypertension, hyperlipidemia, diabetes mellitus, H/O stroke in past without residual deficits, asymptomatic carotid artery stenosis and IPF, CAD,hx of MI with CABG S/P multiple coronary interventions,  PCI to high-grade stenosis of the SVG to RCA in March 2022 and again in April 2023 needing aspiration thrombectomy followed by repeat stenting to the SVG to RCA when he presented with NSTEMI, EF now has improved from  35-40%  to normal in May 2023.  This is a 37-monthoffice visit, presently doing well, has not had any recurrence of angina pectoris.  Has chronic dyspnea related to both obesity and IPF, no PND or orthopnea or leg edema.  Past Medical History:  Diagnosis Date   Abnormal exercise myocardial perfusion study    03/2018- 38% ef, see report   Arthritis    Chronic kidney disease    Congestive heart failure (CHF) ([facility] 09/2015   Coronary artery disease    DOE (dyspnea on exertion)    ED (erectile dysfunction)    GERD (gastroesophageal reflux disease)    occ   Gout    H/O hiatal hernia    Hyperlipidemia    Hypertension    Myocardial infarction (HCC)    Neuropathy    toes   Obesity    RBBB (right bundle branch block)    Stenosis of right internal carotid artery    Type II diabetes mellitus ([facility]    Umbilical hernia    a. s/p repair.   Vertigo    Past Surgical History:  Procedure Laterality Date   ANKLE ARTHROSCOPY WITH DRILLING/MICROFRACTURE Left 04/13/2013   Procedure: LEFT ANKLE ARTHROSCOPY WITH EXTENSIVE DEBRIEDMENT;  Surgeon: JWylene Simmer MD;  Location: [facility]  Service: Orthopedics;  Laterality: Left;   ANKLE SURGERY Left 08/18/2013   DR HEWITT    CARDIAC CATHETERIZATION  2000   stents x3   CARDIAC CATHETERIZATION  02/15/2014   Procedure: LEFT HEART CATH AND CORS/GRAFTS ANGIOGRAPHY;  Surgeon: JLaverda Page MD;  [facility];  Service: Cardiovascular;;   COLONOSCOPY     CORONARY ARTERY BYPASS GRAFT  1995   LIMA to LAD, SVG to RCA, SVG to OM.    CORONARY STENT INTERVENTION N/A 08/12/2020   Procedure: CORONARY STENT INTERVENTION;  Surgeon: PNigel Mormon MD;  [facility];  Service: Cardiovascular;  Laterality: N/A;   CORONARY STENT INTERVENTION N/A 09/04/2021   Procedure: CORONARY STENT INTERVENTION;  Surgeon: GAdrian Prows MD;  [facility];  Service: Cardiovascular;  Laterality: N/A;   LEFT HEART CATH AND CORS/GRAFTS ANGIOGRAPHY N/A 08/12/2020   Procedure: LEFT HEART CATH AND CORS/GRAFTS ANGIOGRAPHY;  Surgeon: PNigel Mormon MD;  [place] [facility];  Service: Cardiovascular;  Laterality: N/A;   LEFT HEART CATH AND CORS/GRAFTS ANGIOGRAPHY N/A 09/04/2021   Procedure: LEFT HEART CATH AND CORS/GRAFTS ANGIOGRAPHY;  Surgeon: GAdrian Prows MD;  [facility];  Service: Cardiovascular;  Laterality: N/A;   TOTAL ANKLE ARTHROPLASTY Left 08/17/2013   Procedure: LEFT TOTAL ANKLE REPLACEMENT WITH POSSIBLE GASTROC RECESSION ;  Surgeon: JWylene Simmer MD;  [place] [facility]  Service: Orthopedics;  Laterality: Left;   UMBILICAL HERNIA REPAIR  12/2007   Family History  Problem Relation Age of Onset   Aneurysm Mother    Heart disease Father    Heart attack Father    Stroke Brother     Social History   Tobacco Use   Smoking status: Former    Packs/day: 1.00    Years: 9.00    Total pack years: 9.00    Types: Cigarettes    Quit date: 10/31/1970    Years since quitting: 51.5   Smokeless tobacco: Former    Types: Chew   Tobacco comments:    chewed for 2 years in his 20's  Substance Use Topics   Alcohol use: Not Currently    Comment: occ wine last 6 months   ROS  Review  of Systems  Cardiovascular:  Positive for dyspnea on exertion. Negative for chest pain and leg swelling.  Respiratory:  Positive for sputum production (chronic).    Objective  Blood pressure 132/85, pulse 71, resp. rate 16, height '5\' 8"'$  (1.727 m), weight 228 lb (103.4 kg), SpO2 95 %.     05/01/2022    1:46 PM 04/06/2022    7:56 AM 10/24/2021   10:47 AM  Vitals with BMI  Height '5\' 8"'$  '5\' 8"'$  '5\' 8"'$   Weight 228 lbs 229 lbs 231 lbs 13 oz  BMI 34.68 75.64 33.29  Systolic 518 841 660  Diastolic 85 82 67  Pulse 71 84 77   Orthostatic VS for the past 72 hrs (Last 3 readings):  Patient Position BP Location Cuff Size  05/01/22 1346 Sitting Left Arm Large   Physical Exam Vitals reviewed.  Constitutional:      Appearance: He is well-developed. He is obese.  Neck:     Vascular: No carotid bruit or JVD.  Cardiovascular:     Rate and Rhythm: Normal rate and regular rhythm.     Heart sounds: Murmur heard.     Early systolic murmur is present with a grade of 2/6 at the upper right sternal border.     No gallop.  Pulmonary:     Effort: Pulmonary effort is normal.     Breath sounds: Examination of the right-middle field reveals rales. Examination of the right-lower field reveals rales. Examination of the left-lower field reveals rales. Rales (left worse than right coarse crackles) present.  Abdominal:     General: Bowel sounds are normal.     Palpations: Abdomen is soft.  Musculoskeletal:     Right lower leg: No edema.     Left lower leg: No edema.     Laboratory examination:   Recent Labs    05/13/21 1543 08/12/21 1253 09/04/21 1230 09/04/21 1326 10/02/21 1150 10/03/21 2327 04/06/22 0822  NA 137   < > 138   < > 142 139 140  K 4.2   < > 4.3   < > 4.6 3.7 4.4  CL 105   < > 108   < > 105 107 104  CO2 23   < > 23   < > '20 22 24  '$ GLUCOSE 108*   < > 103*   < > 86 162* 112*  BUN 15   < > 17   < > '15 18 12  '$ CREATININE 1.32*   < > 1.35*   < > 1.41* 2.02* 1.20  CALCIUM 9.0   < > 9.1   <  > 9.8 9.4 9.5  GFRNONAA 55*  --  54*  --   --  33*  --    < > = values in this interval not displayed.      Latest Ref Rng & Units 04/06/2022    8:22 AM 10/03/2021   11:27 PM 10/02/2021   11:50 AM  CMP  Glucose 65 - 99 mg/dL 112  162  86   BUN 7 - 25 mg/dL '12  18  15   '$ Creatinine 0.70 - 1.28 mg/dL 1.20  2.02  1.41   Sodium 135 - 146 mmol/L 140  139  142   Potassium 3.5 - 5.3 mmol/L 4.4  3.7  4.6   Chloride 98 - 110 mmol/L 104  107  105   CO2 20 - 32 mmol/L '24  22  20   '$ Calcium 8.6 - 10.3 mg/dL 9.5  9.4  9.8   Total Protein 6.1 - 8.1 g/dL 8.1  8.0    Total Bilirubin 0.2 - 1.2 mg/dL 0.5  0.7    Alkaline Phos 38 - 126 U/L  41    AST 10 - 35 U/L 19  24    ALT 9 - 46 U/L 10  15        Latest Ref Rng & Units 04/06/2022    8:22 AM 10/03/2021   11:27 PM 09/08/2021    3:32 PM  CBC  WBC 3.8 - 10.8 Thousand/uL 6.9  8.9  8.3   Hemoglobin 13.2 - 17.1 g/dL 11.2  11.3  11.3   Hematocrit 38.5 - 50.0 % 36.5  36.0  35.8   Platelets 140 - 400 Thousand/uL 362  304  325    Lipid Panel Recent Labs    04/06/22 0822  CHOL 135  TRIG 237*  LDLCALC 59  HDL 46  CHOLHDL 2.9     HEMOGLOBIN A1C Lab Results  Component Value Date   HGBA1C 6.8 (H) 04/06/2022   MPG 148 04/06/2022   Lab Results  Component Value Date   TSH 4.86 (H) 08/01/2019    Allergies   Allergies  Allergen Reactions   Brilinta [Ticagrelor] Shortness Of Breath   Alpha-Gal Hives   Crestor [Rosuvastatin Calcium] Other (See Comments)    Muscle pain- tolerating this 2022, however   Lipitor [Atorvastatin] Other (See Comments)    Intolerable muscle pain all over     Medications   Current Outpatient Medications:    acetaminophen (TYLENOL) 500 MG tablet, Take 500 mg by mouth every 6 (six) hours as needed for mild pain., Disp: , Rfl:    aspirin EC 81 MG tablet, Take 1 tablet (81 mg total) by mouth daily., Disp: , Rfl:    clopidogrel (PLAVIX) 75 MG tablet, Take 1 tablet (75 mg total) by mouth daily., Disp: 90 tablet, Rfl: 2    empagliflozin (JARDIANCE) 25 MG TABS tablet, Take 1 tablet (25 mg total) by mouth daily before breakfast., Disp: 30 tablet, Rfl: 1   EPINEPHrine 0.3 mg/0.3 mL IJ SOAJ injection, Inject 0.3 mg into the muscle as needed for anaphylaxis., Disp: 1 each, Rfl: 0   furosemide (LASIX) 20 MG tablet, Take 1 tablet (20 mg total) by mouth daily., Disp: 30 tablet, Rfl: 11   metFORMIN (GLUCOPHAGE-XR) 500 MG 24 hr tablet, Take 1 tablet (500 mg total) by mouth 2 (two) times daily., Disp: 90 tablet, Rfl: 3   nitroGLYCERIN (NITROSTAT) 0.4 MG SL tablet, Place 1 tablet (0.4 mg total) under the tongue every 5 (five) minutes as needed for chest pain., Disp: 15 tablet, Rfl: 3  pantoprazole (PROTONIX) 40 MG tablet, Take 1 tablet (40 mg total) by mouth daily., Disp: 30 tablet, Rfl: 0   rosuvastatin (CRESTOR) 40 MG tablet, Take 1 tablet (40 mg total) by mouth daily., Disp: 30 tablet, Rfl: 0   Semaglutide, 1 MG/DOSE, 4 MG/3ML SOPN, Inject 1 mg into the skin once a week., Disp: 9 mL, Rfl: 1    Radiology:   CT angio chest 07/25/2019: 1. No evidence of pulmonary embolism. 2. Scattered ground-glass density throughout both lungs with increased central and subpleural reticulation and areas of mosaic attenuation. No frank honeycombing or significant bronchiectasis. Differential considerations include chronic hypersensitivity pneumonitis or idiopathic interstitial pneumonia such as NSIP. Pulmonary consultation is suggested, as well as high-resolution chest CT follow-up in 6 months. 3. 5 mm pulmonary nodule along the right major fissure. No follow-up needed if patient is low-risk. Non-contrast chest CT can be considered in 12 months if patient is high-risk.  4.  Aortic atherosclerosis (ICD10-I70.0).  CT Chest 12/04/2020:  1. 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 Findings are consistent with UIP per consensus guidelines: Diagnosis of Idiopathic Pulmonary  Fibrosis: An Official ATS/ERS/JRS/ALAT Clinical Practice Guideline. [place], Iss 5, [phone], Jan 23 2017. 2. Stable borderline mild cardiomegaly. 3. Large hiatal hernia. 4. Aortic Atherosclerosis (ICD10-I70.0).  Cardiac Studies:   Left Heart Catheterization 09/04/21:  LV: 121/10, EDP 26 mmHg.  Ao 118/62, mean 83 mmHg.  No pressure gradient across the aortic valve. LM: Large vessel, mild disease. LAD: Occluded in the midsegment.  Distal segment supplied by LIMA to LAD which is widely patent. LCx: Occluded in the proximal to mid segment.  OM1 is supplied by SVG to OM which is widely patent. RCA: Occluded in the midsegment just proximal to previously placed stent.  SVG to RCA is patent, previously placed 3.5 x 38 mm resolute Onyx DES is widely patent in the distal RCA, proximal to mid 38 mm Taxus DES placed in May 2010 has a thrombotic 80% stenosis, focal.  There is mild luminal loss throughout the stent.   Intervention data: Successful filter wire protected aspiration thrombectomy followed by stenting of the proximal segment of the SVG to RCA with implantation of a 4.0 x 12 mm resolute frontier DES at 18 atmospheric pressure, post stenosis 0%.  TIMI-3 to TIMI-3 flow maintained.    Echocardiogram 10/15/2021:  Normal LV systolic function with visual EF 55-60%. Left ventricle cavity  is normal in size. Normal left ventricular wall thickness. Normal global  wall motion. Abnormal septal wall motion due to post-operative septum.  Normal diastolic filling pattern, normal LAP.  Pericardium is normal. Insignificant pericardial effusion. There is no hemodynamic significance.  Compared to 11/06/2020 LVEF 35-40% and RWMA have now improved, otherwise no significant change.   Carotid artery duplex 04/24/2022: Duplex suggests stenosis in the right internal carotid artery (16-49%). Duplex suggests stenosis in the left internal carotid artery (16-49%). Compared to the study  done on 10/15/2021, right ICA stenosis of >50% normal appears to have regressed. Follow up in one year is appropriate if clinically indicated.  EKG   EKG 05/01/2022: Normal sinus rhythm at rate of 69 bpm, left atrial enlargement, right axis deviation, left posterior fascicular block.  Right bundle branch block.  Bifascicular block.  Possible right ventricular hypertrophy.  Compared to 09/12/2021, no significant change.  Assessment   1. Coronary artery disease of native artery of native heart with stable angina pectoris ([place])  2. Asymptomatic bilateral carotid artery stenosis   3. Chronic diastolic heart failure ([place])   4. Mixed hyperlipidemia   5. Type 2 diabetes mellitus with stage 3b chronic kidney disease, without long-term current use of insulin (HCC)     Meds ordered this encounter  Medications   Semaglutide, 1 MG/DOSE, 4 MG/3ML SOPN    Sig: Inject 1 mg into the skin once a week.    Dispense:  9 mL    Refill:  1   Semaglutide(0.25 or 0.'5MG'$ /DOS) SOPN 0.25 mg   Medications Discontinued During This Encounter  Medication Reason   famotidine (PEPCID) 20 MG tablet Patient Preference   carvedilol (COREG) 3.125 MG tablet Side effect (s)   gabapentin (NEURONTIN) 300 MG capsule Patient Preference   Administrations This Visit     Semaglutide(0.25 or 0.'5MG'$ /DOS) SOPN 0.25 mg     Admin Date 05/01/2022 Action Given Dose 0.25 mg Route Subcutaneous Administered By Gaye Alken, CMA             Orders Placed This Encounter  Procedures   EKG 12-Lead     Recommendations:   Chris Wurm.  is a 81 y.o. Caucasian male patient with hypertension, hyperlipidemia, diabetes mellitus, H/O stroke in past without residual deficits, asymptomatic carotid artery stenosis and IPF, CAD,hx of MI with CABG S/P multiple coronary interventions,  PCI to high-grade stenosis of the SVG to RCA in March 2022 and again in April 2023 needing aspiration thrombectomy followed by repeat stenting to the  SVG to RCA when he presented with NSTEMI, EF now has improved from  35-40%  to normal in May 2023.  1. Coronary artery disease of native artery of native heart with stable angina pectoris [facility]) Patient remains asymptomatic without recurrence of angina pectoris.  Presently on DAPT, will continue this for additional 6 months in view of ACS and discontinue aspirin at a later date.  He had thrombotic event at the stented segment of the SVG to RCA and its will need long-term Plavix.  2. Asymptomatic bilateral carotid artery stenosis Carotid stenosis appears to have improved and progressed to <50%, will need repeat carotid artery duplex in a year.  3. Chronic diastolic heart failure ([place]) He appears to be well compensated.  Presently tolerating Jardiance that was recently started.  He is not on a beta-blocker or an ACE inhibitor as he does not like to take medications and it makes him feel bad.  4. Mixed hyperlipidemia Presently tolerating Crestor 40 mg, lipids specifically LDL is very well-controlled however elevated triglycerides related to uncontrolled diabetes mellitus.  5. Type 2 diabetes mellitus with stage 3b chronic kidney disease, without long-term current use of insulin (HCC) After extensive discussion, obesity, poor eating habits, heart failure, CAD, Ozempic would be a ideal choice, I given him sample and started him on therapy of 0.25 mg and will uptitrate this to maximum tolerated dose.  Having small meals, avoiding fatty meals, or even eating half a meal and waiting for 15 to 20 minutes before completing the other half will certainly alleviate his nausea and vomiting.  I would like to see him back in 6 weeks to evaluate his tolerance.    Adrian Prows, MD, [facility] 05/02/2022, 8:03 AM Office: [phone] Fax: [phone] Pager: [phone]

## 2022-05-02 ENCOUNTER — Encounter: Payer: Self-pay | Admitting: Cardiology

## 2022-05-04 ENCOUNTER — Other Ambulatory Visit: Payer: Medicare HMO

## 2022-05-14 ENCOUNTER — Telehealth: Payer: Self-pay | Admitting: Family Medicine

## 2022-05-14 ENCOUNTER — Ambulatory Visit (INDEPENDENT_AMBULATORY_CARE_PROVIDER_SITE_OTHER): Payer: Medicare HMO

## 2022-05-14 ENCOUNTER — Telehealth: Payer: Self-pay

## 2022-05-14 VITALS — Ht 68.0 in | Wt 229.0 lb

## 2022-05-14 DIAGNOSIS — Z0000 Encounter for general adult medical examination without abnormal findings: Secondary | ICD-10-CM | POA: Diagnosis not present

## 2022-05-14 NOTE — Telephone Encounter (Signed)
Error

## 2022-05-14 NOTE — Progress Notes (Signed)
Subjective:   Chris [name]. is a 81 y.o. male who presents for Medicare Annual/Subsequent preventive examination.  I connected with  Chris [name]. on 05/14/22 by a audio enabled telemedicine application and verified that I am speaking with the correct person using two identifiers.  Patient Location: Home  Provider Location: Home Office  I discussed the limitations of evaluation and management by telemedicine. The patient expressed understanding and agreed to proceed.  Review of Systems     Cardiac Risk Factors include: advanced age (>75mn, >>72women);diabetes mellitus;dyslipidemia;male gender;hypertension     Objective:    Today's Vitals   05/14/22 1615  Weight: 229 lb (103.9 kg)  Height: _0  (1.727 m)   Body mass index is 34.82 kg/m.     05/14/2022    4:17 PM 10/03/2021   11:18 PM 05/13/2021    3:35 PM 05/07/2021   12:40 PM 02/07/2021    2:33 PM 08/13/2020    9:00 AM 02/16/2014    6:00 AM  Advanced Directives  Does Patient Have a Medical Advance Directive? Yes _1  Yes  Type of Advance Directive Living will;Healthcare Power of Attorney      Living will;Healthcare Power of Attorney  Does patient want to make changes to medical advance directive? No - Patient declined      No - Patient declined  Copy of [place] Chart? No - copy requested      No - copy requested  Would patient like information on creating a medical advance directive?   No - Patient declined No - Patient declined No - Patient declined No - Patient declined     Current Medications (verified) Outpatient Encounter Medications as of 05/14/2022  Medication Sig   acetaminophen (TYLENOL) 500 MG tablet Take 500 mg by mouth every 6 (six) hours as needed for mild pain.   aspirin EC 81 MG tablet Take 1 tablet (81 mg total) by mouth daily.   clopidogrel (PLAVIX) 75 MG tablet Take 1 tablet (75 mg total) by mouth daily.   empagliflozin (JARDIANCE) 25 MG TABS tablet Take 1  tablet (25 mg total) by mouth daily before breakfast.   EPINEPHrine 0.3 mg/0.3 mL IJ SOAJ injection Inject 0.3 mg into the muscle as needed for anaphylaxis.   furosemide (LASIX) 20 MG tablet Take 1 tablet (20 mg total) by mouth daily.   metFORMIN (GLUCOPHAGE-XR) 500 MG 24 hr tablet Take 1 tablet (500 mg total) by mouth 2 (two) times daily.   nitroGLYCERIN (NITROSTAT) 0.4 MG SL tablet Place 1 tablet (0.4 mg total) under the tongue every 5 (five) minutes as needed for chest pain.   rosuvastatin (CRESTOR) 40 MG tablet Take 1 tablet (40 mg total) by mouth daily.   Semaglutide, 1 MG/DOSE, 4 MG/3ML SOPN Inject 1 mg into the skin once a week.   pantoprazole (PROTONIX) 40 MG tablet Take 1 tablet (40 mg total) by mouth daily.   No facility-administered encounter medications on file as of 05/14/2022.    Allergies (verified) Brilinta [ticagrelor], Alpha-gal, Crestor [rosuvastatin calcium], and Lipitor [atorvastatin]   History: Past Medical History:  Diagnosis Date   Abnormal exercise myocardial perfusion study    03/2018- 38% ef, see report   Arthritis    Chronic kidney disease    Congestive heart failure (CHF) ([place] 09/2015   Coronary artery disease    DOE (dyspnea on exertion)    ED (erectile dysfunction)    GERD (gastroesophageal reflux disease)  occ   Gout    H/O hiatal hernia    Hyperlipidemia    Hypertension    Myocardial infarction (HCC)    Neuropathy    toes   Obesity    RBBB (right bundle branch block)    Stenosis of right internal carotid artery    Type II diabetes mellitus ([place])    Umbilical hernia    a. s/p repair.   Vertigo    Past Surgical History:  Procedure Laterality Date   ANKLE ARTHROSCOPY WITH DRILLING/MICROFRACTURE Left 04/13/2013   Procedure: LEFT ANKLE ARTHROSCOPY WITH EXTENSIVE DEBRIEDMENT;  Surgeon: Wylene Simmer, MD;  Location: [place];  Service: Orthopedics;  Laterality: Left;   ANKLE SURGERY Left 08/18/2013   DR HEWITT   CARDIAC  CATHETERIZATION  2000   stents x3   CARDIAC CATHETERIZATION  02/15/2014   Procedure: LEFT HEART CATH AND CORS/GRAFTS ANGIOGRAPHY;  Surgeon: Laverda Page, MD;  Location: [facility] CATH LAB;  Service: Cardiovascular;;   COLONOSCOPY     CORONARY ARTERY BYPASS GRAFT  1995   LIMA to LAD, SVG to RCA, SVG to OM.    CORONARY STENT INTERVENTION N/A 08/12/2020   Procedure: CORONARY STENT INTERVENTION;  Surgeon: Nigel Mormon, MD;  Location: [place] CV LAB;  Service: Cardiovascular;  Laterality: N/A;   CORONARY STENT INTERVENTION N/A 09/04/2021   Procedure: CORONARY STENT INTERVENTION;  Surgeon: Adrian Prows, MD;  Location: [place] CV LAB;  Service: Cardiovascular;  Laterality: N/A;   LEFT HEART CATH AND CORS/GRAFTS ANGIOGRAPHY N/A 08/12/2020   Procedure: LEFT HEART CATH AND CORS/GRAFTS ANGIOGRAPHY;  Surgeon: Nigel Mormon, MD;  Location: [place] CV LAB;  Service: Cardiovascular;  Laterality: N/A;   LEFT HEART CATH AND CORS/GRAFTS ANGIOGRAPHY N/A 09/04/2021   Procedure: LEFT HEART CATH AND CORS/GRAFTS ANGIOGRAPHY;  Surgeon: Adrian Prows, MD;  Location: [place] CV LAB;  Service: Cardiovascular;  Laterality: N/A;   TOTAL ANKLE ARTHROPLASTY Left 08/17/2013   Procedure: LEFT TOTAL ANKLE REPLACEMENT WITH POSSIBLE GASTROC RECESSION ;  Surgeon: Wylene Simmer, MD;  Location: [place];  Service: Orthopedics;  Laterality: Left;   UMBILICAL HERNIA REPAIR  12/2007   Family History  Problem Relation Age of Onset   Aneurysm Mother    Heart disease Father    Heart attack Father    Stroke Brother    Social History   Socioeconomic History   Marital status: Married    Spouse name: Not on file   Number of children: 4   Years of education: Not on file   Highest education level: Not on file  Occupational History   Not on file  Tobacco Use   Smoking status: Former    Packs/day: 1.00    Years: 9.00    Total pack years: 9.00    Types: Cigarettes    Quit date: 10/31/1970    Years since quitting:  51.5   Smokeless tobacco: Former    Types: Chew   Tobacco comments:    chewed for 2 years in his 20's  Vaping Use   Vaping Use: Never used  Substance and Sexual Activity   Alcohol use: Not Currently    Comment: occ wine last 6 months   Drug use: No   Sexual activity: Yes  Other Topics Concern   Not on file  Social History Narrative   Lives in [place] with wife.  Retired.   Social Determinants of Health   Financial Resource Strain: Low Risk  (05/14/2022)   Overall Financial Resource  Strain (CARDIA)    Difficulty of Paying Living Expenses: Not hard at all  Food Insecurity: No Food Insecurity (05/14/2022)   Hunger Vital Sign    Worried About Running Out of Food in the Last Year: Never true    Ran Out of Food in the Last Year: Never true  Transportation Needs: No Transportation Needs (05/14/2022)   PRAPARE - Hydrologist (Medical): No    Lack of Transportation (Non-Medical): No  Physical Activity: Insufficiently Active (05/14/2022)   Exercise Vital Sign    Days of Exercise per Week: 3 days    Minutes of Exercise per Session: 30 min  Stress: No Stress Concern Present (05/14/2022)   [place]    Feeling of Stress : Only a little  Social Connections: Socially Integrated (05/14/2022)   Social Connection and Isolation Panel [NHANES]    Frequency of Communication with Friends and Family: More than three times a week    Frequency of Social Gatherings with Friends and Family: Three times a week    Attends Religious Services: More than 4 times per year    Active Member of Clubs or Organizations: Yes    Attends Music therapist: More than 4 times per year    Marital Status: Married    Tobacco Counseling Counseling given: Not Answered Tobacco comments: chewed for 2 years in his 20's   Clinical Intake:  Pre-visit preparation completed: Yes  Pain : No/denies pain      Diabetes: Yes CBG done?: No Did pt. bring in CBG monitor from home?: No  How often do you need to have someone help you when you read instructions, pamphlets, or other written materials from your doctor or pharmacy?: 1 - Never  Diabetic?Yes Nutrition Risk Assessment:  Has the patient had any N/V/D within the last 2 months?  No  Does the patient have any non-healing wounds?  No  Has the patient had any unintentional weight loss or weight gain?  No   Diabetes:  Is the patient diabetic?  Yes  If diabetic, was a CBG obtained today?  No  Did the patient bring in their glucometer from home?  No  How often do you monitor your CBG's? As needed .   Financial Strains and Diabetes Management:  Are you having any financial strains with the device, your supplies or your medication? No .  Does the patient want to be seen by Chronic Care Management for management of their diabetes?  No  Would the patient like to be referred to a Nutritionist or for Diabetic Management?  No   Diabetic Exams:  Diabetic Eye Exam: Completed 09/11/21 Diabetic Foot Exam: Completed 04/06/22   Interpreter Needed?: No  Information entered by :: [name] LPN   Activities of Daily Living    05/14/2022    4:17 PM  In your present state of health, do you have any difficulty performing the following activities:  Hearing? 0  Vision? 0  Difficulty concentrating or making decisions? 0  Walking or climbing stairs? 0  Dressing or bathing? 0  Doing errands, shopping? 0  Preparing Food and eating ? N  Using the Toilet? N  In the past six months, have you accidently leaked urine? N  Do you have problems with loss of bowel control? N  Managing your Medications? N  Managing your Finances? N  Housekeeping or managing your Housekeeping? N    Patient Care Team: [place],  [name], MD as PCP - General (Family Medicine) Edythe Clarity, [facility] as Pharmacist (Pharmacist)  Indicate any recent Medical Services  you may have received from other than [facility] providers in the past year (date may be approximate).     Assessment:   This is a routine wellness examination for Francisca.  Hearing/Vision screen Hearing Screening - Comments:: Denies hearing difficulty   Vision Screening - Comments:: up to date with routine eye exams with Dr. Katy Fitch    Dietary issues and exercise activities discussed: Current Exercise Habits: Home exercise routine, Type of exercise: walking, Time (Minutes): 30, Frequency (Times/Week): 3, Weekly Exercise (Minutes/Week): 90, Intensity: Mild   Goals Addressed   None   Depression Screen    05/14/2022    4:16 PM 04/06/2022    7:58 AM 02/07/2021    2:21 PM  PHQ 2/9 Scores  PHQ - 2 Score 0 0 0    Fall Risk    05/14/2022    4:15 PM 04/06/2022    7:58 AM 02/07/2021    2:34 PM  [place] in the past year? 0 0 0  Number falls in past yr: 0 0 0  Injury with Fall? 0 0 0  Risk for fall due to : No Fall Risks No Fall Risks No Fall Risks  Follow up Falls evaluation completed;Education provided;Falls prevention discussed Falls prevention discussed Falls prevention discussed    FALL RISK PREVENTION PERTAINING TO THE HOME:  Any stairs in or around the home? Yes  If so, are there any without handrails? No  Home free of loose throw rugs in walkways, pet beds, electrical cords, etc? Yes  Adequate lighting in your home to reduce risk of falls? Yes   ASSISTIVE DEVICES UTILIZED TO PREVENT FALLS:  Life alert? No  Use of a cane, walker or w/c? No  Grab bars in the bathroom? Yes  Shower chair or bench in shower? No  Elevated toilet seat or a handicapped toilet? Yes   TIMED UP AND GO:  Was the test performed? No . Telephonic visit   Cognitive Function:        05/14/2022    4:18 PM 02/07/2021    2:38 PM  6CIT Screen  What Year? 0 points 0 points  What month? 0 points 0 points  What time? 0 points 0 points  Count back from 20 0 points 0 points  Months in  reverse 0 points 4 points  Repeat phrase 2 points 0 points  Total Score 2 points 4 points    Immunizations Immunization History  Administered Date(s) Administered   PFIZER(Purple Top)SARS-COV-2 Vaccination 07/09/2019, 08/08/2019   Pneumococcal Conjugate-13 11/13/2016   Pneumococcal Polysaccharide-23 08/18/2013   Tdap 11/13/2016    TDAP status: Up to date  Flu Vaccine status: Up to date  Pneumococcal vaccine status: Up to date  Covid-19 vaccine status: Information provided on how to obtain vaccines.   Qualifies for Shingles Vaccine? Yes   Zostavax completed No   Shingrix Completed?: No.    Education has been provided regarding the importance of this vaccine. Patient has been advised to call insurance company to determine out of pocket expense if they have not yet received this vaccine. Advised may also receive vaccine at local pharmacy or Health Dept. Verbalized acceptance and understanding.  Screening Tests Health Maintenance  Topic Date Due   COVID-19 Vaccine (3 - 2023-24 season) 01/23/2022   Zoster Vaccines- Shingrix (1 of 2) 07/07/2022 (Originally 03/23/1991)   INFLUENZA VACCINE  08/23/2022 (Originally 12/23/2021)   Hepatitis C Screening  04/07/2023 (Originally 03/23/1959)   OPHTHALMOLOGY EXAM  09/12/2022   HEMOGLOBIN A1C  10/05/2022   Diabetic kidney evaluation - eGFR measurement  04/07/2023   Diabetic kidney evaluation - Urine ACR  04/07/2023   FOOT EXAM  04/07/2023   Medicare Annual Wellness (AWV)  05/15/2023   DTaP/Tdap/Td (2 - Td or Tdap) 11/14/2026   Pneumonia Vaccine 5+ Years old  Completed   HPV VACCINES  Aged Out    Health Maintenance  Health Maintenance Due  Topic Date Due   COVID-19 Vaccine (3 - 2023-24 season) 01/23/2022    Colorectal cancer screening: No longer required.   Lung Cancer Screening: (Low Dose CT Chest recommended if Age 26-80 years, 30 pack-year currently smoking OR have quit w/in 15years.) does not qualify.   Lung Cancer Screening  Referral: n/a   Additional Screening:  Hepatitis C Screening: does not qualify  Vision Screening: Recommended annual ophthalmology exams for early detection of glaucoma and other disorders of the eye. Is the patient up to date with their annual eye exam?  Yes  Who is the provider or what is the name of the office in which the patient attends annual eye exams? Dr. Katy Fitch  If pt is not established with a provider, would they like to be referred to a provider to establish care? No .   Dental Screening: Recommended annual dental exams for proper oral hygiene  Community Resource Referral / Chronic Care Management: CRR required this visit?  No   CCM required this visit?  No      Plan:     I have personally reviewed and noted the following in the patient's chart:   Medical and social history Use of alcohol, tobacco or illicit drugs  Current medications and supplements including opioid prescriptions. Patient is not currently taking opioid prescriptions. Functional ability and status Nutritional status Physical activity Advanced directives List of other physicians Hospitalizations, surgeries, and ER visits in previous 12 months Vitals Screenings to include cognitive, depression, and falls Referrals and appointments  In addition, I have reviewed and discussed with patient certain preventive protocols, quality metrics, and best practice recommendations. A written personalized care plan for preventive services as well as general preventive health recommendations were provided to patient.     Vanetta Mulders, [place]   [id number]  Due to this being a virtual visit, the after visit summary with patients personalized plan was offered to patient via mail or my-chart. per request, patient was mailed a copy of AVS.  Nurse Notes: No concerns

## 2022-05-14 NOTE — Patient Instructions (Signed)
Chris [name] , Thank you for taking time to come for your Medicare Wellness Visit. I appreciate your ongoing commitment to your health goals. Please review the following plan we discussed and let me know if I can assist you in the future.   These are the goals we discussed:  Goals      Have 3 meals a day     Pharmacy Care Plan:     CARE PLAN ENTRY (see longitudinal plan of care for additional care plan information)  Current Barriers:  Chronic Disease Management support, education, and care coordination needs related to Hypertension, Hyperlipidemia, Diabetes, and Heart Failure   Hypertension BP Readings from Last 3 Encounters:  11/28/19 110/66  11/16/19 108/68  09/21/19 100/60  Pharmacist Clinical Goal(s): Over the next 180 days, patient will work with PharmD and providers to maintain BP goal <130/80 Current regimen:  Entresto 49-'51mg'$  Interventions: Discussed medication adherence Reviewed most recent office BP Patient self care activities - Over the next 180 days, patient will: Check BP periodically, document, and provide at future appointments Ensure daily salt intake < 2300 mg/day Focus on medication adherence  Hyperlipidemia Lab Results  Component Value Date/Time   LDLCALC 69 08/01/2019 08:35 AM   LDLDIRECT 58 08/01/2019 08:35 AM  Pharmacist Clinical Goal(s): Over the next 180 days, patient will work with PharmD and providers to maintain LDL goal < 70 Current regimen:  Rosuvastatin '40mg'$  daily Interventions: Counseled on importance of statin medications Discussed goals of therapy with most recent lipid panel Patient self care activities - Over the next 180 days, patient will: Continue to focus on medication adherence using pill box  Diabetes Lab Results  Component Value Date/Time   HGBA1C 6.4 (H) 11/10/2018 04:06 PM   HGBA1C 6.6 (H) 09/24/2017 10:03 AM  Pharmacist Clinical Goal(s): Over the next 180 days, patient will work with PharmD and providers to achieve A1c goal  <7% Current regimen:  Metformin '500mg'$  twice daily with meals Interventions: Reviewed most recent A1c Discussed importance of medication adherence Patient self care activities - Over the next 180 days, patient will: Check blood sugar at OVs, document, and provide at future appointments Contact provider with any episodes of hypoglycemia  Heart Failure Pharmacist Clinical Goal(s) Over the next 180 days, patient will work with PharmD and providers to optimize medication and minimize symptoms of HF Current regimen:  Entresto 49-'51mg'$  Interventions: Identified barrier of medication adherence Discussed memory aids for evening medication administration Patient self care activities - Over the next 180 days, patient will: Begin taking Entresto twice daily as directed Contact providers with any new or worsening symptoms  Initial goal documentation      Track and Manage Symptoms-Heart Failure     Timeframe:  Long-Range Goal Priority:  High Start Date:  10/16/21                           Expected End Date: 04/18/22                      Follow Up Date 01/16/22    - begin a heart failure diary - develop a rescue plan - eat more whole grains, fruits and vegetables, lean meats and healthy fats - track symptoms and what helps feel better or worse    Why is this important?   You will be able to handle your symptoms better if you keep track of them.  Making some simple changes to your lifestyle will  help.  Eating healthy is one thing you can do to take good care of yourself.    Notes:         This is a list of the screening recommended for you and due dates:  Health Maintenance  Topic Date Due   COVID-19 Vaccine (3 - 2023-24 season) 01/23/2022   Zoster (Shingles) Vaccine (1 of 2) 07/07/2022*   Flu Shot  08/23/2022*   Hepatitis C Screening: USPSTF Recommendation to screen - Ages 18-79 yo.  04/07/2023*   Eye exam for diabetics  09/12/2022   Hemoglobin A1C  10/05/2022   Yearly kidney  function blood test for diabetes  04/07/2023   Yearly kidney health urinalysis for diabetes  04/07/2023   Complete foot exam   04/07/2023   Medicare Annual Wellness Visit  05/15/2023   DTaP/Tdap/Td vaccine (2 - Td or Tdap) 11/14/2026   Pneumonia Vaccine  Completed   HPV Vaccine  Aged Out  *Topic was postponed. The date shown is not the original due date.    Advanced directives: Please bring a copy of your health care power of attorney and living will to the office to be added to your chart at your convenience.   Conditions/risks identified: Aim for 30 minutes of exercise or brisk walking, 6-8 glasses of water, and 5 servings of fruits and vegetables each day.   Next appointment: Follow up in one year for your annual wellness visit.   Preventive Care 60 Years and Older, Male  Preventive care refers to lifestyle choices and visits with your health care provider that can promote health and wellness. What does preventive care include? A yearly physical exam. This is also called an annual well check. Dental exams once or twice a year. Routine eye exams. Ask your health care provider how often you should have your eyes checked. Personal lifestyle choices, including: Daily care of your teeth and gums. Regular physical activity. Eating a healthy diet. Avoiding tobacco and drug use. Limiting alcohol use. Practicing safe sex. Taking low doses of aspirin every day. Taking vitamin and mineral supplements as recommended by your health care provider. What happens during an annual well check? The services and screenings done by your health care provider during your annual well check will depend on your age, overall health, lifestyle risk factors, and family history of disease. Counseling  Your health care provider may ask you questions about your: Alcohol use. Tobacco use. Drug use. Emotional well-being. Home and relationship well-being. Sexual activity. Eating habits. History of  falls. Memory and ability to understand (cognition). Work and work Statistician. Screening  You may have the following tests or measurements: Height, weight, and BMI. Blood pressure. Lipid and cholesterol levels. These may be checked every 5 years, or more frequently if you are over 21 years old. Skin check. Lung cancer screening. You may have this screening every year starting at age 75 if you have a 30-pack-year history of smoking and currently smoke or have quit within the past 15 years. Fecal occult blood test (FOBT) of the stool. You may have this test every year starting at age 2. Flexible sigmoidoscopy or colonoscopy. You may have a sigmoidoscopy every 5 years or a colonoscopy every 10 years starting at age 27. Prostate cancer screening. Recommendations will vary depending on your family history and other risks. Hepatitis C blood test. Hepatitis B blood test. Sexually transmitted disease (STD) testing. Diabetes screening. This is done by checking your blood sugar (glucose) after you have not eaten for a  while (fasting). You may have this done every 1-3 years. Abdominal aortic aneurysm (AAA) screening. You may need this if you are a current or former smoker. Osteoporosis. You may be screened starting at age 64 if you are at high risk. Talk with your health care provider about your test results, treatment options, and if necessary, the need for more tests. Vaccines  Your health care provider may recommend certain vaccines, such as: Influenza vaccine. This is recommended every year. Tetanus, diphtheria, and acellular pertussis (Tdap, Td) vaccine. You may need a Td booster every 10 years. Zoster vaccine. You may need this after age 3. Pneumococcal 13-valent conjugate (PCV13) vaccine. One dose is recommended after age 70. Pneumococcal polysaccharide (PPSV23) vaccine. One dose is recommended after age 22. Talk to your health care provider about which screenings and vaccines you need and  how often you need them. This information is not intended to replace advice given to you by your health care provider. Make sure you discuss any questions you have with your health care provider. Document Released: 06/07/2015 Document Revised: 01/29/2016 Document Reviewed: 03/12/2015 Elsevier Interactive Patient Education  2017 [place] Prevention in the Home Falls can cause injuries. They can happen to people of all ages. There are many things you can do to make your home safe and to help prevent falls. What can I do on the outside of my home? Regularly fix the edges of walkways and driveways and fix any cracks. Remove anything that might make you trip as you walk through a door, such as a raised step or threshold. Trim any bushes or trees on the path to your home. Use bright outdoor lighting. Clear any walking paths of anything that might make someone trip, such as rocks or tools. Regularly check to see if handrails are loose or broken. Make sure that both sides of any steps have handrails. Any raised decks and porches should have guardrails on the edges. Have any leaves, snow, or ice cleared regularly. Use sand or salt on walking paths during winter. Clean up any spills in your garage right away. This includes oil or grease spills. What can I do in the bathroom? Use night lights. Install grab bars by the toilet and in the tub and shower. Do not use towel bars as grab bars. Use non-skid mats or decals in the tub or shower. If you need to sit down in the shower, use a plastic, non-slip stool. Keep the floor dry. Clean up any water that spills on the floor as soon as it happens. Remove soap buildup in the tub or shower regularly. Attach bath mats securely with double-sided non-slip rug tape. Do not have throw rugs and other things on the floor that can make you trip. What can I do in the bedroom? Use night lights. Make sure that you have a light by your bed that is easy to  reach. Do not use any sheets or blankets that are too big for your bed. They should not hang down onto the floor. Have a firm chair that has side arms. You can use this for support while you get dressed. Do not have throw rugs and other things on the floor that can make you trip. What can I do in the kitchen? Clean up any spills right away. Avoid walking on wet floors. Keep items that you use a lot in easy-to-reach places. If you need to reach something above you, use a strong step stool that has a grab  bar. Keep electrical cords out of the way. Do not use floor polish or wax that makes floors slippery. If you must use wax, use non-skid floor wax. Do not have throw rugs and other things on the floor that can make you trip. What can I do with my stairs? Do not leave any items on the stairs. Make sure that there are handrails on both sides of the stairs and use them. Fix handrails that are broken or loose. Make sure that handrails are as long as the stairways. Check any carpeting to make sure that it is firmly attached to the stairs. Fix any carpet that is loose or worn. Avoid having throw rugs at the top or bottom of the stairs. If you do have throw rugs, attach them to the floor with carpet tape. Make sure that you have a light switch at the top of the stairs and the bottom of the stairs. If you do not have them, ask someone to add them for you. What else can I do to help prevent falls? Wear shoes that: Do not have high heels. Have rubber bottoms. Are comfortable and fit you well. Are closed at the toe. Do not wear sandals. If you use a stepladder: Make sure that it is fully opened. Do not climb a closed stepladder. Make sure that both sides of the stepladder are locked into place. Ask someone to hold it for you, if possible. Clearly mark and make sure that you can see: Any grab bars or handrails. First and last steps. Where the edge of each step is. Use tools that help you move  around (mobility aids) if they are needed. These include: Canes. Walkers. Scooters. Crutches. Turn on the lights when you go into a dark area. Replace any light bulbs as soon as they burn out. Set up your furniture so you have a clear path. Avoid moving your furniture around. If any of your floors are uneven, fix them. If there are any pets around you, be aware of where they are. Review your medicines with your doctor. Some medicines can make you feel dizzy. This can increase your chance of falling. Ask your doctor what other things that you can do to help prevent falls. This information is not intended to replace advice given to you by your health care provider. Make sure you discuss any questions you have with your health care provider. Document Released: 03/07/2009 Document Revised: 10/17/2015 Document Reviewed: 06/15/2014 Elsevier Interactive Patient Education  2017 [employer].

## 2022-05-14 NOTE — Telephone Encounter (Signed)
Left message to change his AWVS today to do by telephone due to [facility] working remotely.   If patient does not want to do by phone, please reschedule to another day.  Thank you,  [facility]  Ambulatory Clinical Support for Care Management [place] [facility]  ??[phone] or ??[phone]

## 2022-06-03 ENCOUNTER — Telehealth: Payer: Self-pay | Admitting: Pharmacist

## 2022-06-03 NOTE — Progress Notes (Signed)
Care Management & Coordination Services Pharmacy Team  Reason for Encounter: Hypertension  Contacted patient on 06/03/22 to discuss hypertension disease state.   Recent office visits:  04/06/22 Jenna Luo. MD - CAD - Family Medicine - Labs were ordered. Referral placed to Pulmonology and Ortho. Follow up as scheduled.   Recent consult visits:  05/01/22 Adrian Prows, MD - Cardiology - CAD - EKG performed. Semaglutide, 1 MG/DOSE, 4 MG/3ML SOPN prescribed. Follow up in 6 weeks.   Hospital visits:  None in previous 6 months  Medications: Outpatient Encounter Medications as of 06/03/2022  Medication Sig   acetaminophen (TYLENOL) 500 MG tablet Take 500 mg by mouth every 6 (six) hours as needed for mild pain.   aspirin EC 81 MG tablet Take 1 tablet (81 mg total) by mouth daily.   clopidogrel (PLAVIX) 75 MG tablet Take 1 tablet (75 mg total) by mouth daily.   empagliflozin (JARDIANCE) 25 MG TABS tablet Take 1 tablet (25 mg total) by mouth daily before breakfast.   EPINEPHrine 0.3 mg/0.3 mL IJ SOAJ injection Inject 0.3 mg into the muscle as needed for anaphylaxis.   furosemide (LASIX) 20 MG tablet Take 1 tablet (20 mg total) by mouth daily.   metFORMIN (GLUCOPHAGE-XR) 500 MG 24 hr tablet Take 1 tablet (500 mg total) by mouth 2 (two) times daily.   nitroGLYCERIN (NITROSTAT) 0.4 MG SL tablet Place 1 tablet (0.4 mg total) under the tongue every 5 (five) minutes as needed for chest pain.   pantoprazole (PROTONIX) 40 MG tablet Take 1 tablet (40 mg total) by mouth daily.   rosuvastatin (CRESTOR) 40 MG tablet Take 1 tablet (40 mg total) by mouth daily.   Semaglutide, 1 MG/DOSE, 4 MG/3ML SOPN Inject 1 mg into the skin once a week.   No facility-administered encounter medications on file as of 06/03/2022.    Recent Office Vitals: BP Readings from Last 3 Encounters:  05/01/22 132/85  04/06/22 128/82  10/24/21 120/67   Pulse Readings from Last 3 Encounters:  05/01/22 71  04/06/22 84  10/24/21 77     Wt Readings from Last 3 Encounters:  05/14/22 229 lb (103.9 kg)  05/01/22 228 lb (103.4 kg)  04/06/22 229 lb (103.9 kg)     Kidney Function Lab Results  Component Value Date/Time   CREATININE 1.20 04/06/2022 08:22 AM   CREATININE 2.02 (H) 10/03/2021 11:27 PM   CREATININE 1.41 (H) 10/02/2021 11:50 AM   CREATININE 1.98 (H) 09/08/2021 03:32 PM   GFRNONAA 33 (L) 10/03/2021 11:27 PM   GFRNONAA 56 (L) 08/30/2020 02:56 PM   GFRAA 65 08/30/2020 02:56 PM       Latest Ref Rng & Units 04/06/2022    8:22 AM 10/03/2021   11:27 PM 10/02/2021   11:50 AM  BMP  Glucose 65 - 99 mg/dL 112  162  86   BUN 7 - 25 mg/dL '12  18  15   '$ Creatinine 0.70 - 1.28 mg/dL 1.20  2.02  1.41   BUN/Creat Ratio 6 - 22 (calc) SEE NOTE:   11   Sodium 135 - 146 mmol/L 140  139  142   Potassium 3.5 - 5.3 mmol/L 4.4  3.7  4.6   Chloride 98 - 110 mmol/L 104  107  105   CO2 20 - 32 mmol/L '24  22  20   '$ Calcium 8.6 - 10.3 mg/dL 9.5  9.4  9.8      Current antihypertensive regimen:  furosemide (LASIX) 20 MG tablet  Patient verbally confirms he is taking the above medications as directed. Yes  How often are you checking your Blood Pressure? weekly  he checks his blood pressure in the morning after taking his medication.  Current home BP readings: patient states he is checking several days a week and they are "fine" he did not have any specific readings to report at the time of the call.   DATE:             BP               PULSE    Wrist or arm cuff:  Caffeine intake: limits Salt intake:limits OTC medications including pseudoephedrine or NSAIDs? limits  Any readings above 180/120? No If yes any symptoms of hypertensive emergency? patient denies any symptoms of high blood pressure   What recent interventions/DTPs have been made by any provider to improve Blood Pressure control since last CPP Visit:  Patient Carvedilol was d/cd by Dr Einar Gip 05/01/22  Any recent hospitalizations or ED visits since last  visit with CPP? No  What diet changes have been made to improve Blood Pressure Control?  Patient reported he watched his salt intake and weighs weekly due to CAD.  What exercise is being done to improve your Blood Pressure Control?  Patient reported he tries to remains active as possible.  Adherence Review: Is the patient currently on ACE/ARB medication? No Does the patient have >5 day gap between last estimated fill dates? No  Star Rating Drugs:  Medication:  Last Fill: Day Supply Rosuvastatin '40mg'$  03/30/22  90 Metformin '500mg'$  04/06/22 90 Jardiance '25mg'$  04/10/22 90   No future appointments.    [facility]

## 2022-06-15 NOTE — Progress Notes (Signed)
Care Management & Coordination Services Pharmacy Note  06/25/2022 Name:  Chris [name]. MRN:  [id number] DOB:  1941-03-19  Summary: PharmD FU visit.  Not taking Coreg or Entresto due to adverse effects.  Had stopped Lasix due to frequent urination but counseled him on the purpose and benefits to heart.  Agrees to start back.  Tolerating Jardiance although he was out for a few weeks.  Due for A1c check in a few weeks to assess Jardiance.  Will start to monitor weight for fluids at home.  Recommendations/Changes made from today's visit: Recheck A1c at next OV  Follow up plan: FU 6 months CMA to check fluid status periodically   Subjective: Chris [name]. is an 82 y.o. year old male who is a primary patient of Chris [name], Chris Mcgee, MD.  The care coordination team was consulted for assistance with disease management and care coordination needs.    Engaged with patient by telephone for follow up visit.  Recent office visits:  04/06/22 Chris [name]. MD - CAD - Family Medicine - Labs were ordered. Referral placed to Pulmonology and Ortho. Follow up as scheduled.    Recent consult visits:  05/01/22 Chris Prows, MD - Cardiology - CAD - EKG performed. Semaglutide, 1 MG/DOSE, 4 MG/3ML SOPN prescribed. Follow up in 6 weeks.    Objective:  Lab Results  Component Value Date   CREATININE 1.20 04/06/2022   BUN 12 04/06/2022   EGFR 62 04/06/2022   GFRNONAA 33 (L) 10/03/2021   GFRAA 65 08/30/2020   NA 140 04/06/2022   K 4.4 04/06/2022   CALCIUM 9.5 04/06/2022   CO2 24 04/06/2022   GLUCOSE 112 (H) 04/06/2022    Lab Results  Component Value Date/Time   HGBA1C 6.8 (H) 04/06/2022 08:22 AM   HGBA1C 6.9 (H) 09/08/2021 03:32 PM   MICROALBUR 0.7 11/11/2018 09:21 AM   MICROALBUR 1.3 11/13/2016 12:36 PM    Last diabetic Eye exam:  Lab Results  Component Value Date/Time   HMDIABEYEEXA No Retinopathy 09/11/2021 12:00 AM    Last diabetic Foot exam: No results found for: "HMDIABFOOTEX"    Lab Results  Component Value Date   CHOL 135 04/06/2022   HDL 46 04/06/2022   LDLCALC 59 04/06/2022   LDLDIRECT 58 08/01/2019   TRIG 237 (H) 04/06/2022   CHOLHDL 2.9 04/06/2022       Latest Ref Rng & Units 04/06/2022    8:22 AM 10/03/2021   11:27 PM 09/08/2021    3:32 PM  Hepatic Function  Total Protein 6.1 - 8.1 g/dL 8.1  8.0  8.2   Albumin 3.5 - 5.0 g/dL  3.8    AST 10 - 35 U/L '19  24  23   '$ ALT 9 - 46 U/L '10  15  11   '$ Alk Phosphatase 38 - 126 U/L  41    Total Bilirubin 0.2 - 1.2 mg/dL 0.5  0.7  0.5   Bilirubin, Direct 0.0 - 0.2 mg/dL  0.1      Lab Results  Component Value Date/Time   TSH 4.86 (H) 08/01/2019 08:35 AM   TSH 2.000 02/15/2014 07:16 PM       Latest Ref Rng & Units 04/06/2022    8:22 AM 10/03/2021   11:27 PM 09/08/2021    3:32 PM  CBC  WBC 3.8 - 10.8 Thousand/uL 6.9  8.9  8.3   Hemoglobin 13.2 - 17.1 g/dL 11.2  11.3  11.3   Hematocrit 38.5 - 50.0 % 36.5  36.0  35.8   Platelets 140 - 400 Thousand/uL 362  304  325     No results found for: "VD25OH", "VITAMINB12"  Clinical ASCVD: Yes  The ASCVD Risk score (Arnett DK, et al., 2019) failed to calculate for the following [name]:   The patient has a prior MI or stroke diagnosis        05/14/2022    4:16 PM 04/06/2022    7:58 AM 02/07/2021    2:21 PM  Depression screen PHQ 2/9  Decreased Interest 0 0 0  Down, Depressed, Hopeless 0 0 0  PHQ - 2 Score 0 0 0     Social History   Tobacco Use  Smoking Status Former   Packs/day: 1.00   Years: 9.00   Total pack years: 9.00   Types: Cigarettes   Quit date: 10/31/1970   Years since quitting: 51.6  Smokeless Tobacco Former   Types: Chew  Tobacco Comments   chewed for 2 years in his 20's   BP Readings from Last 3 Encounters:  05/01/22 132/85  04/06/22 128/82  10/24/21 120/67   Pulse Readings from Last 3 Encounters:  05/01/22 71  04/06/22 84  10/24/21 77   Wt Readings from Last 3 Encounters:  05/14/22 229 lb (103.9 kg)  05/01/22 228 lb  (103.4 kg)  04/06/22 229 lb (103.9 kg)   BMI Readings from Last 3 Encounters:  05/14/22 34.82 kg/m  05/01/22 34.67 kg/m  04/06/22 34.82 kg/m    Allergies  Allergen Reactions   Brilinta [Ticagrelor] Shortness Of Breath   Alpha-Gal Hives   Crestor [Rosuvastatin Calcium] Other (See Comments)    Muscle pain- tolerating this 2022, however   Lipitor [Atorvastatin] Other (See Comments)    Intolerable muscle pain all over     Medications Reviewed Today     Reviewed by Edythe Clarity, [facility] (Pharmacist) on 06/25/22 at 1452  Med List Status: <None>   Medication Order Taking? Sig Documenting Provider Last Dose Status Informant  acetaminophen (TYLENOL) 500 MG tablet [id number] Yes Take 500 mg by mouth every 6 (six) hours as needed for mild pain. [provider] Taking Active Spouse/Significant Other  aspirin EC 81 MG tablet [id number] Yes Take 1 tablet (81 mg total) by mouth daily. Chris Prows, MD Taking Active Spouse/Significant Other  clopidogrel (PLAVIX) 75 MG tablet [id number] Yes Take 1 tablet (75 mg total) by mouth daily. Susy Frizzle, MD Taking Active   empagliflozin (JARDIANCE) 25 MG TABS tablet [id number] Yes TAKE 1 TABLET EVERY DAY BEFORE BREAKFAST Susy Frizzle, MD Taking Active   EPINEPHRINE 0.3 mg/0.3 mL IJ SOAJ injection [id number] Yes INJECT 1 PEN ( 0.3 MG ) INTO THE MUSCLE AS NEEDED FOR ANAPHYLAXIS. Susy Frizzle, MD Taking Active   furosemide (LASIX) 20 MG tablet [id number] Yes Take 1 tablet (20 mg total) by mouth daily. Susy Frizzle, MD Taking Active   metFORMIN (GLUCOPHAGE-XR) 500 MG 24 hr tablet [id number] Yes Take 1 tablet (500 mg total) by mouth 2 (two) times daily. Susy Frizzle, MD Taking Active   nitroGLYCERIN (NITROSTAT) 0.4 MG SL tablet [id number] Yes Place 1 tablet (0.4 mg total) under the tongue every 5 (five) minutes as needed for chest pain. Susy Frizzle, MD Taking Active   pantoprazole (PROTONIX) 40 MG tablet [id number] Yes TAKE 1  TABLET EVERY DAY Susy Frizzle, MD Taking Active   rosuvastatin (CRESTOR) 40 MG tablet [id number] Yes Take 1 tablet (40 mg total) by mouth daily. Susy Frizzle,  MD Taking Active   Semaglutide, 1 MG/DOSE, 4 MG/3ML SOPN [id number] Yes Inject 1 mg into the skin once a week. Chris Prows, MD Taking Active             SDOH:  (Social Determinants of Health) assessments and interventions performed: No, done within the year Financial Resource Strain: Low Risk  (05/14/2022)   Overall Financial Resource Strain (CARDIA)    Difficulty of Paying Living Expenses: Not hard at all    SDOH Interventions    Flowsheet Row Clinical Support from 05/14/2022 in [place] from 02/07/2021 in [place] Interventions    Food Insecurity Interventions Intervention Not Indicated Intervention Not Indicated  Housing Interventions Intervention Not Indicated Intervention Not Indicated  Transportation Interventions Intervention Not Indicated Intervention Not Indicated  Utilities Interventions Intervention Not Indicated --  Alcohol Usage Interventions Intervention Not Indicated (Score <7) --  Financial Strain Interventions Intervention Not Indicated Intervention Not Indicated  Physical Activity Interventions Intervention Not Indicated Intervention Not Indicated  Stress Interventions Intervention Not Indicated Offered Community Wellness Resources  Social Connections Interventions Intervention Not Indicated Intervention Not Indicated      SDOH Screenings   Food Insecurity: No Food Insecurity (05/14/2022)  Housing: Low Risk  (05/14/2022)  Transportation Needs: No Transportation Needs (05/14/2022)  Utilities: Not At Risk (05/14/2022)  Alcohol Screen: Low Risk  (05/14/2022)  Depression (PHQ2-9): Low Risk  (05/14/2022)  Financial Resource Strain: Low Risk  (05/14/2022)  Physical Activity: Insufficiently Active (05/14/2022)  Social  Connections: Socially Integrated (05/14/2022)  Stress: No Stress Concern Present (05/14/2022)  Tobacco Use: Medium Risk (05/14/2022)    Medication Assistance: None required.  Patient affirms current coverage meets needs.  Medication Access: Within the past 30 days, how often has patient missed a dose of medication? 2 weeks of Jardiance Is a pillbox or other method used to improve adherence? Yes  Factors that may affect medication adherence? nonadherence to medications Are meds synced by current pharmacy? No  Are meds delivered by current pharmacy? Yes  Does patient experience delays in picking up medications due to transportation concerns? No   [facility] Services Reviewed: Is patient disadvantaged to use [facility]?: Yes  Current Rx insurance plan: [employer] Name and location of Current pharmacy:  [facility] Delivery - [place], [place] [address] Phone: [phone] Fax: [phone]  [place], [place] - [place] AT [place] [address] [place] [address] Phone: [phone] Fax: [phone]  [facility] services reviewed with patient today?: Yes  Patient requests to transfer care to [facility]?: No  Reason patient declined to change pharmacies: Disadvantaged due to insurance/mail order  Compliance/Adherence/Medication fill history: Care Gaps: N/A  Star-Rating Drugs: Rosuvastatin '40mg'$  06/06/22 90ds Metformin ER '500mg'$  06/18/22 90ds Jardiance '25mg'$  04/10/22 60ds  Assessment/Plan   Hypertension (BP goal <130/80) -Controlled -Current treatment: None noted -Medications previously tried: Entresto, valsartan, carvedilol  -Current home readings: 126/65 -Current dietary habits: watching sugars, carbs, sweets -Current exercise habits: minimal -Denies hypotensive/hypertensive symptoms -Educated on BP goals and benefits of  medications for prevention of heart attack, stroke and kidney damage; Exercise goal of 150 minutes per week; Importance of home blood pressure monitoring; Symptoms of hypotension and importance of maintaining adequate hydration; -Counseled to monitor BP at home a few times per week, document, and provide log at future appointments -He stopped  Entresto, stopped carvedilol due to making him feel "horrible".  He is aware of risks of not taking these medications and reports BP has been normal.  Feels much better not taking them.  He reports falls walking to the car while he was on these medications. -Recommended he follow up with cardiology on this, will make them aware of medication changes  Diabetes (A1c goal <7%) 06/25/22 -Controlled -Current medications: Metformin '500mg'$  two tablets dailyAppropriate, Effective, Safe, Accessible Jardiance '25mg'$  Appropriate, Effective, Safe, Accessible -Medications previously tried: [place], Ozempic (nausea) -Current home glucose readings fasting glucose: not checking often at home post prandial glucose: not checking often at home -Denies hypoglycemic/hyperglycemic symptoms -Stopped Farxiga along with other meds due to making him feel bad. -Educated on A1c and blood sugar goals; -He does not check glucose at home currently.  Started on Jardiance and tolerating well. -Agrees to schedule visit for updated A1c. No changes to meds at this time - he will need new rx for Jardiance sent to mail order so he does not run out again.  Heart Failure (Goal: manage symptoms and prevent exacerbations) 06/25/22 -Uncontrolled -Last ejection fraction: 35-40%  -Current treatment: Jardiance '25mg'$  Appropriate, Effective, Safe, Accessible Furosemide '20mg'$  Appropriate, Effective, Safe, Accessible -Medications previously tried: Lisabeth Register, carvedilol  -Current home BP/HR readings: "normal" -Educated on Benefits of medications for managing symptoms and prolonging  life Importance of weighing daily; if you gain more than 3 pounds in one day or 5 pounds in one week, contact providers Importance of blood pressure control -He continues to not want to take carvedilol or Entresto.  Stated they made him feel very dizzy.  He is tolerating Jardiance.  Discussed the importance of monitoring fluids and weight.  He does have SOB.  He had stopped his Furosemide due to frequent urination but counseled on its purpose and he is agreeable to start back.   He will start to monitor weights to track fluids. No changes to meds - be sure to not miss jardiance doses!  Patient Goals/Self-Care Activities Patient will:  - take medications as prescribed as evidenced by patient report and record review check blood pressure a few times per week, document, and provide at future appointments  Follow Up Plan: The care management team will reach out to the patient again over the next 180 days.       Beverly Milch, PharmD, CPP Clinical Pharmacist Practitioner [place] [phone]

## 2022-06-18 ENCOUNTER — Other Ambulatory Visit: Payer: Self-pay | Admitting: Family Medicine

## 2022-06-18 DIAGNOSIS — Z91018 Allergy to other foods: Secondary | ICD-10-CM

## 2022-06-18 DIAGNOSIS — K469 Unspecified abdominal hernia without obstruction or gangrene: Secondary | ICD-10-CM

## 2022-06-24 ENCOUNTER — Other Ambulatory Visit: Payer: Self-pay | Admitting: Family Medicine

## 2022-06-25 ENCOUNTER — Ambulatory Visit: Payer: Medicare HMO | Admitting: Pharmacist

## 2022-06-25 DIAGNOSIS — E1122 Type 2 diabetes mellitus with diabetic chronic kidney disease: Secondary | ICD-10-CM | POA: Diagnosis not present

## 2022-06-25 DIAGNOSIS — I509 Heart failure, unspecified: Secondary | ICD-10-CM | POA: Diagnosis not present

## 2022-06-25 DIAGNOSIS — J8410 Pulmonary fibrosis, unspecified: Secondary | ICD-10-CM | POA: Diagnosis not present

## 2022-06-25 DIAGNOSIS — I130 Hypertensive heart and chronic kidney disease with heart failure and stage 1 through stage 4 chronic kidney disease, or unspecified chronic kidney disease: Secondary | ICD-10-CM | POA: Diagnosis not present

## 2022-06-25 NOTE — Telephone Encounter (Signed)
Requested Prescriptions  Pending Prescriptions Disp Refills   JARDIANCE 25 MG TABS tablet [Pharmacy Med Name: JARDIANCE 25 MG Tablet] 90 tablet 0    Sig: TAKE 1 TABLET EVERY [place]     Endocrinology:  Diabetes - SGLT2 Inhibitors Failed - 06/24/2022  4:24 PM      Failed - Valid encounter within last 6 months    Recent Outpatient Visits           9 months ago ASCVD (arteriosclerotic cardiovascular disease)   [place] [name], [name], MD   1 year ago Hiatal hernia   [place] [name], [name], MD   1 year ago Allergy injection reaction, initial encounter   [place] Susy Frizzle, MD   1 year ago Left ureteral stone   [place] Susy Frizzle, MD   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 No urine received.  Result canceled by the ancillary.          Passed - HBA1C is between 0 and 7.9 and within 180 days    Hgb A1c MFr Bld  Date Value Ref Range Status  04/06/2022 6.8 (H) <5.7 % of total Hgb Final    Comment:    For someone without known diabetes, a hemoglobin A1c value of 6.5% or greater indicates that they may have  diabetes and this should be confirmed with a follow-up  test. . For someone with known diabetes, a value <7% indicates  that their diabetes is well controlled and a value  greater than or equal to 7% indicates suboptimal  control. A1c targets should be individualized based on  duration of diabetes, age, comorbid conditions, and  other considerations. . Currently, no consensus exists regarding use of hemoglobin A1c for diagnosis of diabetes for  children. .          Passed - eGFR in normal range and within 360 days    GFR, Est African American  Date Value Ref Range Status  08/30/2020 65 > OR = 60 mL/min/1.85m Final   GFR, Est Non African American  Date Value Ref Range Status  08/30/2020 56 (L) > OR = 60 mL/min/1.767mFinal   GFR, Estimated  Date Value Ref Range Status  10/03/2021 33 (L) >60 mL/min Final    Comment:    (NOTE) Calculated using the CKD-EPI Creatinine Equation (2021)    eGFR  Date Value Ref Range Status  04/06/2022 62 > OR = 60 mL/min/1.7368minal  10/02/2021 51 (L) >59 mL/min/1.73 Final

## 2022-06-29 ENCOUNTER — Telehealth: Payer: Self-pay

## 2022-06-29 NOTE — Telephone Encounter (Signed)
Patient's dentist called and is having a tooth extraction in the morning. Patient is currently on PLAVIX and they need to know if it needs to be held. Please advise.   Also, they only need verbal, but are faxing over clearance form today.

## 2022-06-29 NOTE — Telephone Encounter (Signed)
They do not have to hold plavix. No antibiotic prophylaxis needed.

## 2022-07-01 ENCOUNTER — Encounter: Payer: Self-pay | Admitting: Cardiology

## 2022-07-01 NOTE — Telephone Encounter (Signed)
Patient had appointment moved to Friday.  Please send e-clearance to:  Sander Nephew, DDS [phone]  [place], [place] [address]  Fax: [phone]

## 2022-07-02 ENCOUNTER — Other Ambulatory Visit: Payer: Self-pay

## 2022-07-02 DIAGNOSIS — Z79899 Other long term (current) drug therapy: Secondary | ICD-10-CM

## 2022-08-30 ENCOUNTER — Other Ambulatory Visit: Payer: Self-pay | Admitting: Family Medicine

## 2022-08-30 DIAGNOSIS — E1159 Type 2 diabetes mellitus with other circulatory complications: Secondary | ICD-10-CM

## 2022-09-01 NOTE — Telephone Encounter (Signed)
Requested Prescriptions  Pending Prescriptions Disp Refills   metFORMIN (GLUCOPHAGE-XR) 500 MG 24 hr tablet [Pharmacy Med Name: METFORMIN HYDROCHLORIDE ER 500 MG Tablet Extended Release 24 Hour] 180 tablet 0    Sig: TAKE 1 TABLET TWICE DAILY     Endocrinology:  Diabetes - Biguanides Failed - 08/30/2022  3:42 AM      Failed - B12 Level in normal range and within 720 days    No results found for: "VITAMINB12"       Failed - Valid encounter within last 6 months    Recent Outpatient Visits           11 months ago ASCVD (arteriosclerotic cardiovascular disease)   [facility] Donita Brooks, MD   1 year ago Hiatal hernia   [facility] [name], [name], MD   1 year ago Allergy injection reaction, initial encounter   [facility] Donita Brooks, MD   1 year ago Left ureteral stone   [facility] Donita Brooks, MD   2 years ago 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 [name], [name], MD              Passed - Cr in normal range and within 360 days    Creat  Date Value Ref Range Status  04/06/2022 1.20 0.70 - 1.28 mg/dL Final   Creatinine, Urine  Date Value Ref Range Status  04/06/2022 CANCELED      Comment:    TEST NOT PERFORMED . No urine received.  Result canceled by the ancillary.          Passed - HBA1C is between 0 and 7.9 and within 180 days    Hgb A1c MFr Bld  Date Value Ref Range Status  04/06/2022 6.8 (H) <5.7 % of total Hgb Final    Comment:    For someone without known diabetes, a hemoglobin A1c value of 6.5% or greater indicates that they may have  diabetes and this should be confirmed with a follow-up  test. . For someone with known diabetes, a value <7% indicates  that their diabetes is well controlled and a value  greater than or equal to 7% indicates suboptimal  control. A1c targets should be individualized based on  duration of diabetes, age,  comorbid conditions, and  other considerations. . Currently, no consensus exists regarding use of hemoglobin A1c for diagnosis of diabetes for children. .          Passed - eGFR in normal range and within 360 days    GFR, Est African American  Date Value Ref Range Status  08/30/2020 65 > OR = 60 mL/min/1.82m2 Final   GFR, Est Non African American  Date Value Ref Range Status  08/30/2020 56 (L) > OR = 60 mL/min/1.12m2 Final   GFR, Estimated  Date Value Ref Range Status  10/03/2021 33 (L) >60 mL/min Final    Comment:    (NOTE) Calculated using the CKD-EPI Creatinine Equation (2021)    eGFR  Date Value Ref Range Status  04/06/2022 62 > OR = 60 mL/min/1.14m2 Final  10/02/2021 51 (L) >59 mL/min/1.73 Final         Passed - CBC within normal limits and completed in the last 12 months    WBC  Date Value Ref Range Status  04/06/2022 6.9 3.8 - 10.8 Thousand/uL Final   RBC  Date Value Ref Range Status  04/06/2022 4.84 4.20 -  5.80 Million/uL Final   Hemoglobin  Date Value Ref Range Status  04/06/2022 11.2 (L) 13.2 - 17.1 g/dL Final   HCT  Date Value Ref Range Status  04/06/2022 36.5 (L) 38.5 - 50.0 % Final   MCHC  Date Value Ref Range Status  04/06/2022 30.7 (L) 32.0 - 36.0 g/dL Final   [facility]  Date Value Ref Range Status  04/06/2022 23.1 (L) 27.0 - 33.0 pg Final   MCV  Date Value Ref Range Status  04/06/2022 75.4 (L) 80.0 - 100.0 fL Final   No results found for: "PLTCOUNTKUC", "LABPLAT", "POCPLA" RDW  Date Value Ref Range Status  04/06/2022 18.3 (H) 11.0 - 15.0 % Final

## 2022-11-10 ENCOUNTER — Other Ambulatory Visit: Payer: Self-pay | Admitting: Family Medicine

## 2022-11-12 ENCOUNTER — Telehealth: Payer: Self-pay

## 2022-11-12 ENCOUNTER — Other Ambulatory Visit: Payer: Self-pay

## 2022-11-12 DIAGNOSIS — I6521 Occlusion and stenosis of right carotid artery: Secondary | ICD-10-CM

## 2022-11-12 DIAGNOSIS — I5042 Chronic combined systolic (congestive) and diastolic (congestive) heart failure: Secondary | ICD-10-CM

## 2022-11-12 DIAGNOSIS — I25110 Atherosclerotic heart disease of native coronary artery with unstable angina pectoris: Secondary | ICD-10-CM

## 2022-11-12 MED ORDER — FUROSEMIDE 20 MG PO TABS: 20.0000 mg | ORAL_TABLET | Freq: Every day | ORAL | 3 refills | Status: DC

## 2022-11-12 NOTE — Telephone Encounter (Signed)
Prescription Request  11/12/2022  LOV: 05/14/22  What is the name of the medication or equipment? furosemide (LASIX) 20 MG tablet [[id number]]  Have you contacted your pharmacy to request a refill? Yes   Which pharmacy would you like this sent to?  [facility] - [place], [place] - [address] [place] [address] Phone: [phone] Fax: [phone]    Patient notified that their request is being sent to the clinical staff for review and that they should receive a response within 2 business days.   Please advise at [facility] [phone]

## 2022-11-13 ENCOUNTER — Other Ambulatory Visit: Payer: Self-pay | Admitting: Family Medicine

## 2022-11-13 DIAGNOSIS — E1159 Type 2 diabetes mellitus with other circulatory complications: Secondary | ICD-10-CM

## 2022-11-16 ENCOUNTER — Other Ambulatory Visit: Payer: Self-pay

## 2022-11-16 DIAGNOSIS — E1159 Type 2 diabetes mellitus with other circulatory complications: Secondary | ICD-10-CM

## 2022-11-16 MED ORDER — LANCET DEVICE MISC
1.0000 | Freq: Three times a day (TID) | 0 refills | Status: AC
Start: 2022-11-16 — End: 2022-12-16

## 2022-11-16 MED ORDER — BLOOD GLUCOSE TEST VI STRP: 1.0000 | ORAL_STRIP | Freq: Three times a day (TID) | 0 refills | Status: AC

## 2022-11-16 MED ORDER — LANCETS MISC. MISC
1.0000 | Freq: Three times a day (TID) | 3 refills | Status: AC
Start: 2022-11-16 — End: 2022-12-16

## 2022-11-16 MED ORDER — BLOOD GLUCOSE MONITORING SUPPL DEVI: 1.0000 | Freq: Three times a day (TID) | 0 refills | Status: AC

## 2022-11-22 ENCOUNTER — Other Ambulatory Visit: Payer: Self-pay | Admitting: Family Medicine

## 2022-11-22 DIAGNOSIS — I5042 Chronic combined systolic (congestive) and diastolic (congestive) heart failure: Secondary | ICD-10-CM

## 2022-11-22 DIAGNOSIS — I6521 Occlusion and stenosis of right carotid artery: Secondary | ICD-10-CM

## 2022-11-22 DIAGNOSIS — I25110 Atherosclerotic heart disease of native coronary artery with unstable angina pectoris: Secondary | ICD-10-CM

## 2022-12-15 IMAGING — CT CT ANGIO CHEST-ABD-PELV FOR DISSECTION W/ AND WO/W CM
2 of 7 series · 14 of 46 positions shown, 18 images · non-contrast
Comparison: 12/04/2020

CLINICAL DATA: Acute aortic syndrome suspected.  Chest pain

EXAM:
CT ANGIOGRAPHY CHEST, ABDOMEN AND PELVIS
TECHNIQUE: Non-contrast CT of the chest was initially obtained.

[Series 7: dissection 2mm · axial · 0.71mm/px · z∈[+748,+1246]mm · 11 of 297 slices shown, 15 images]
[im 32/297  soft-tissue]
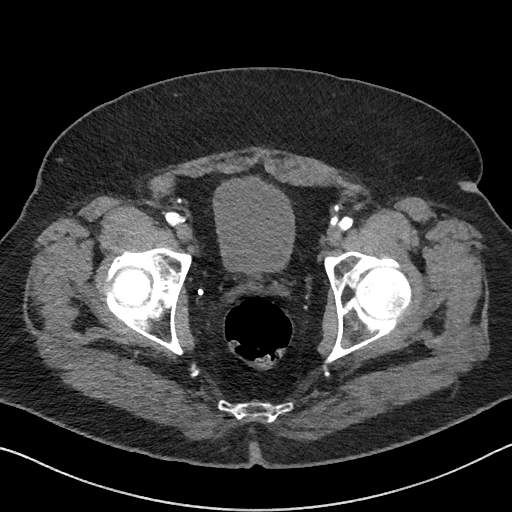
[im 32/297  bone]
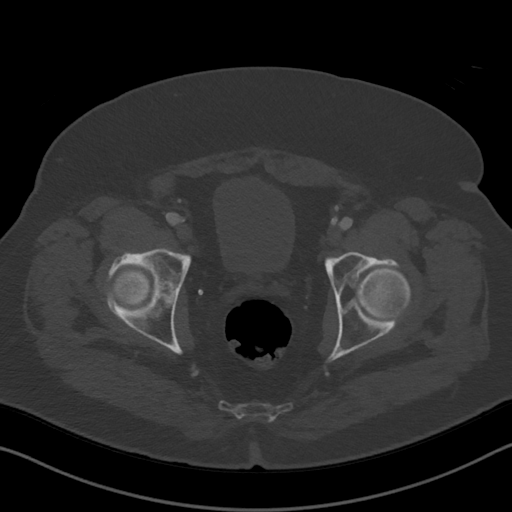
[im 63/297  soft-tissue]
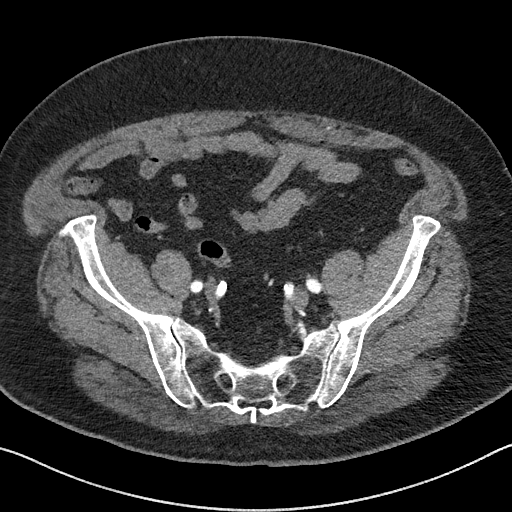
[im 94/297  soft-tissue]
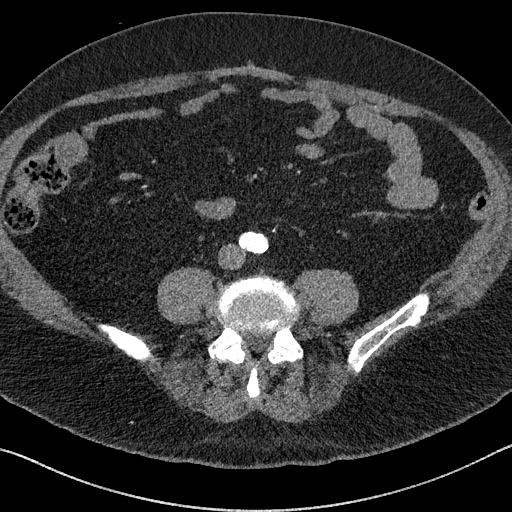
[im 125/297  soft-tissue]
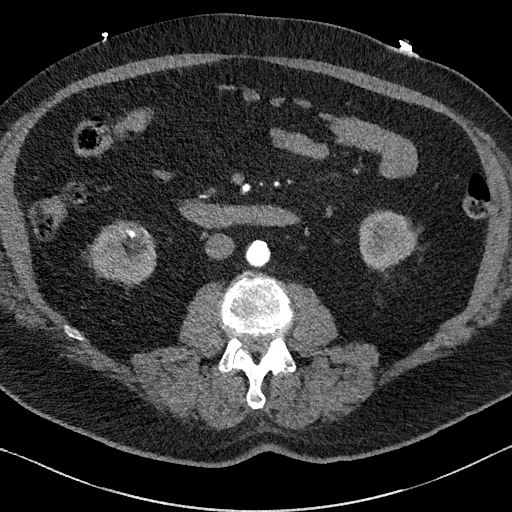
[im 156/297  soft-tissue]
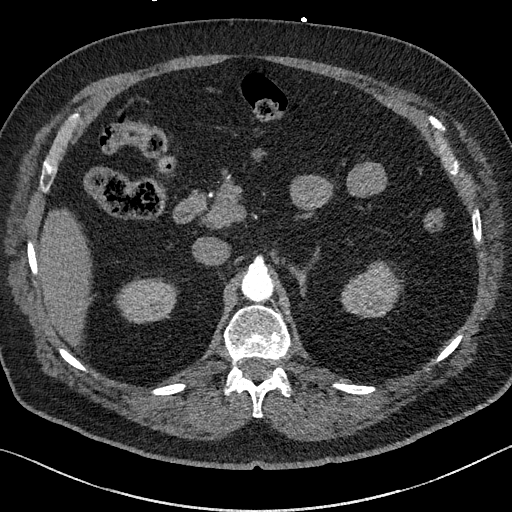
[im 187/297  soft-tissue]
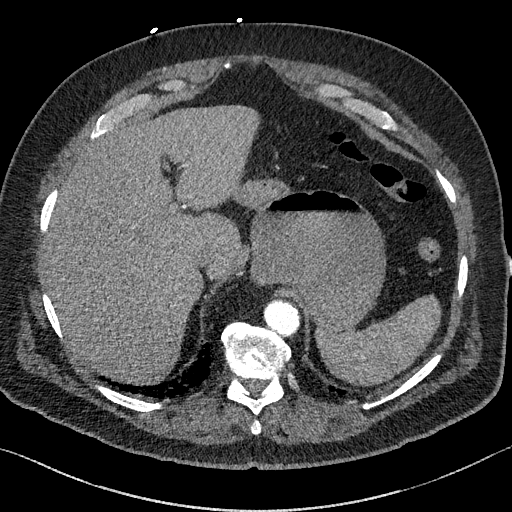
[im 219/297  soft-tissue]
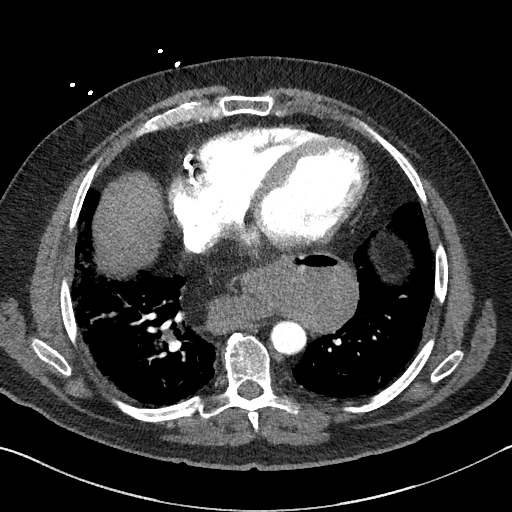
[im 234/297  lung]
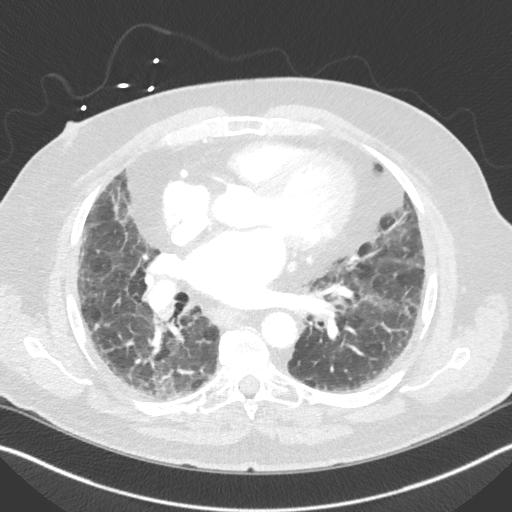
[im 250/297  soft-tissue]
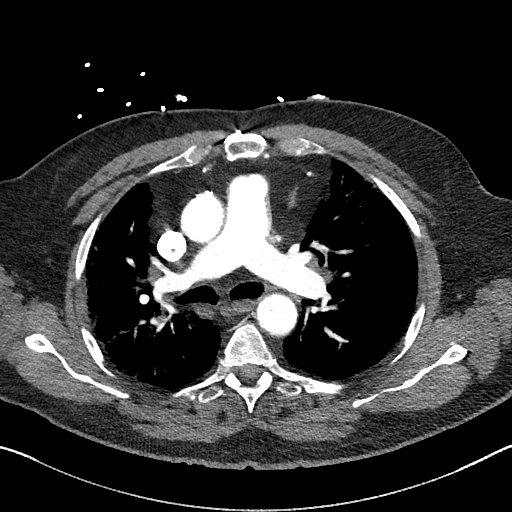
[im 250/297  lung]
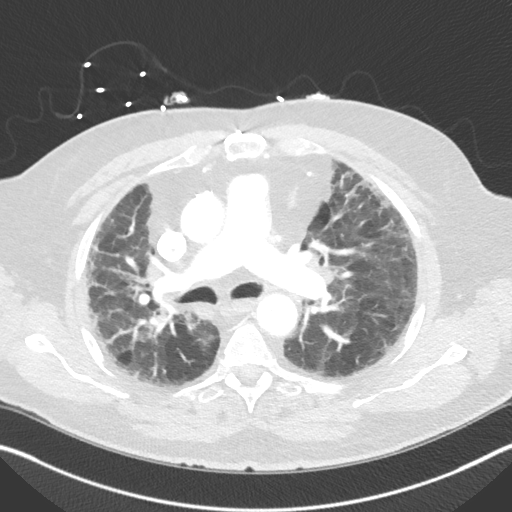
[im 265/297  lung]
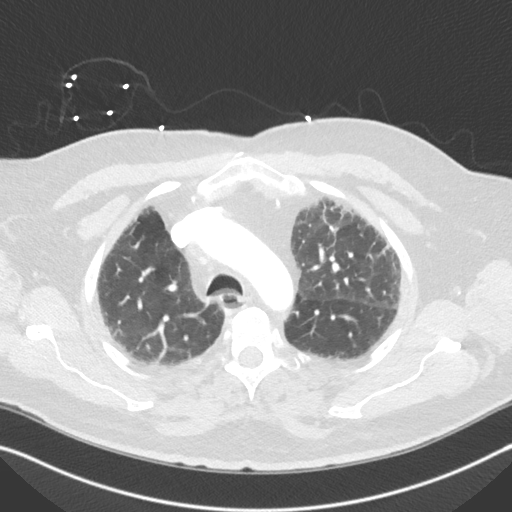
[im 281/297  soft-tissue]
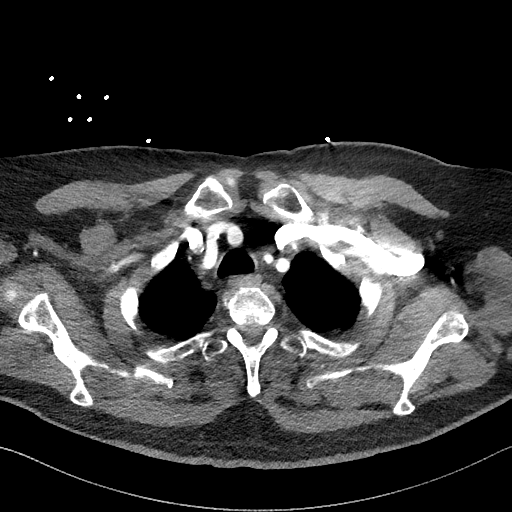
[im 281/297  lung]
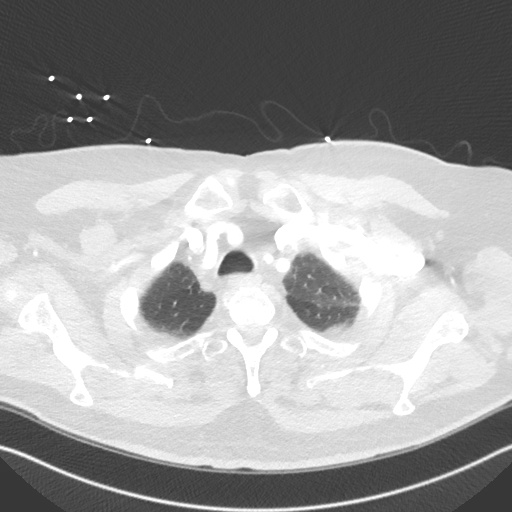
[im 281/297  bone]
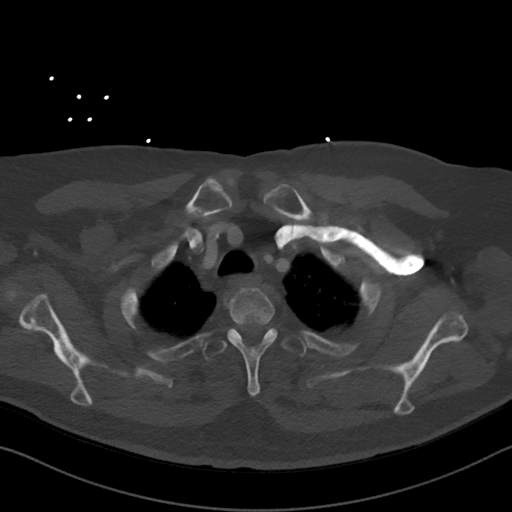

[Series 10: dissection 2mm cor · coronal · 1.16mm/px · 3 of 177 slices shown]
[im 45/177  soft-tissue]
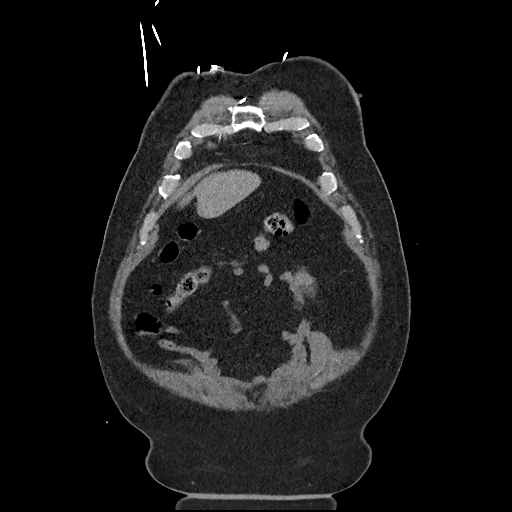
[im 89/177  soft-tissue]
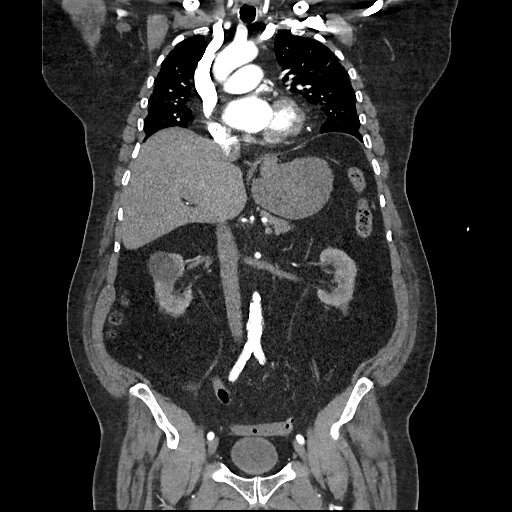
[im 133/177  soft-tissue]
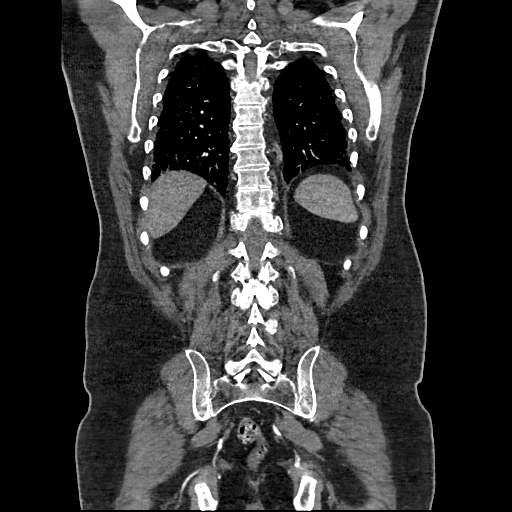

[14 of 46 positions shown; findings below may reference images not displayed]

Multidetector CT imaging through the chest, abdomen and pelvis was
performed using the standard protocol during bolus administration of
intravenous contrast. Multiplanar reconstructed images and MIPs were
obtained and reviewed to evaluate the vascular anatomy.

CONTRAST:  100mL OMNIPAQUE IOHEXOL 350 MG/ML SOLN
FINDINGS: CTA CHEST FINDINGS

Cardiovascular: No filling defects in the pulmonary arteries to
suggest pulmonary emboli. Heart is normal size. Aorta is normal
caliber. Diffusely calcified coronary arteries. Prior CABG.
Scattered aortic calcifications. No aneurysm or dissection.

Mediastinum/Nodes: No mediastinal, hilar, or axillary adenopathy.
Trachea and esophagus are unremarkable. Thyroid unremarkable. Large
hiatal hernia.

Lungs/Pleura: Chronic areas of scarring. Scattered ground-glass
opacities could reflect early edema. No effusions.

Musculoskeletal: Chest wall soft tissues are unremarkable.

Review of the MIP images confirms the above findings.

CTA ABDOMEN AND PELVIS FINDINGS

VASCULAR

Aorta: Aortic atherosclerosis.  No aneurysm or dissection.

Celiac: Patent without evidence of aneurysm, dissection, vasculitis
or significant stenosis.

SMA: Patent without evidence of aneurysm, dissection, vasculitis or
significant stenosis.

Renals: Both renal arteries are patent without evidence of aneurysm,
dissection, vasculitis, fibromuscular dysplasia or significant
stenosis.

IMA: Patent without evidence of aneurysm, dissection, vasculitis or
significant stenosis.

Inflow: Scattered calcifications.  No aneurysm or dissection.

Veins: No obvious venous abnormality within the limitations of this
arterial phase study.

Review of the MIP images confirms the above findings.

NON-VASCULAR

Hepatobiliary: No focal hepatic abnormality. Gallbladder
unremarkable.

Pancreas: No focal abnormality or ductal dilatation.

Spleen: No focal abnormality.  Normal size.

Adrenals/Urinary Tract: 4 cm cyst in the midpole of the right
kidney. 4 mm calcification in the lower pole of the right kidney. No
hydronephrosis. Adrenal glands and urinary bladder unremarkable.

Stomach/Bowel: Sigmoid diverticulosis. No active diverticulitis.
Stomach and small bowel decompressed, unremarkable. Normal appendix

Lymphatic: No adenopathy

Reproductive: Mildly prominent prostate.

Other: No free fluid or free air.

Musculoskeletal: No acute bony abnormality.

Review of the MIP images confirms the above findings.
IMPRESSION: No evidence of aortic aneurysm or dissection.

No evidence of pulmonary embolus.

Scattered ground-glass opacities in the lungs could reflect early
edema.

Prior CABG.

Large hiatal hernia.

Sigmoid diverticulosis.

## 2022-12-15 IMAGING — DX DG CHEST 1V PORT
1 series · 1 of 1 positions shown · non-contrast
Comparison: 05/06/2021

CLINICAL DATA: Sudden onset chest pain, hyperventilation

EXAM:
PORTABLE CHEST 1 VIEW

[chest ap]
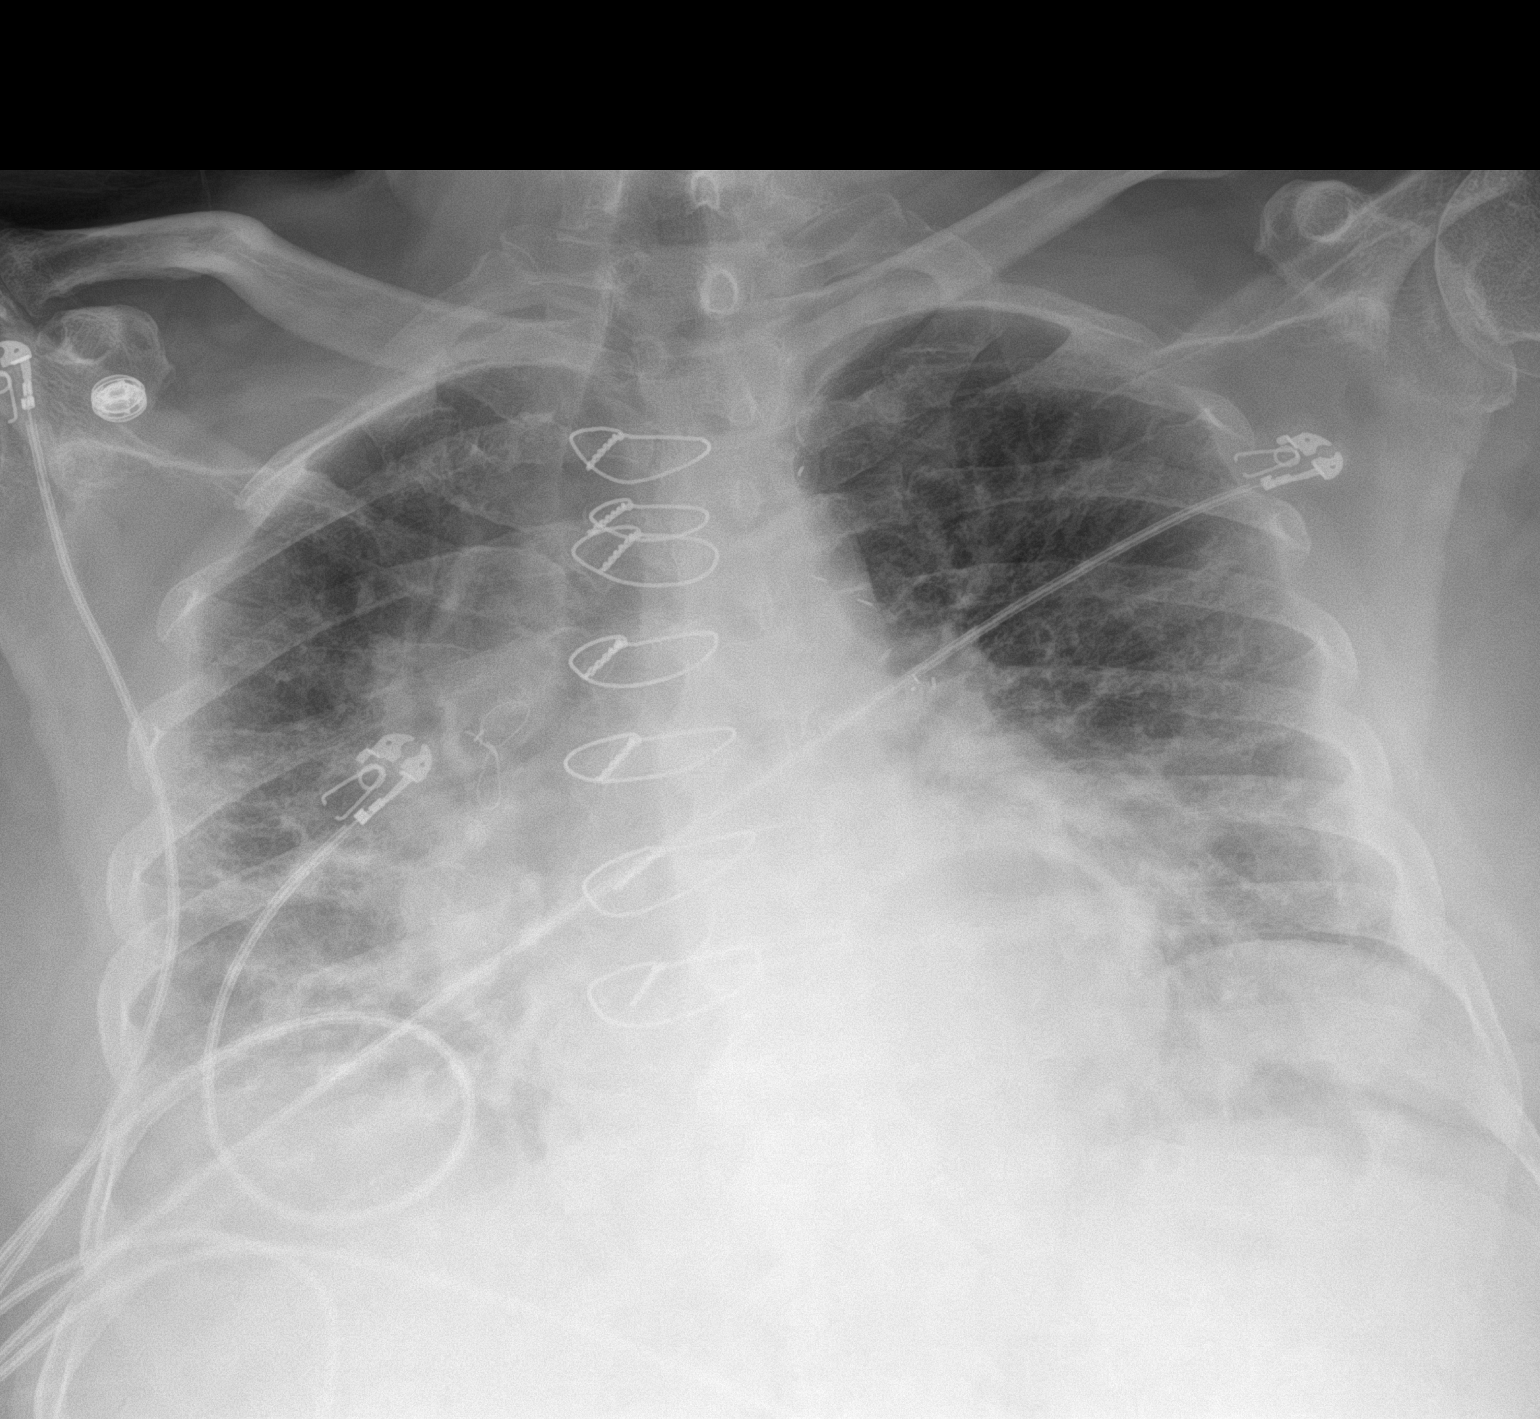

[1 of 1 positions shown; findings below may reference images not displayed]

FINDINGS: Single frontal view of the chest demonstrates stable enlargement the
cardiac silhouette. Postsurgical changes from CABG. There is central
vascular congestion, with diffuse interstitial prominence and
scattered basilar ground-glass airspace disease consistent with
edema. No large effusion or pneumothorax. No acute bony
abnormalities.
IMPRESSION: 1. Mild volume overload.

## 2022-12-25 ENCOUNTER — Encounter: Payer: Self-pay | Admitting: Pharmacist

## 2022-12-25 ENCOUNTER — Encounter: Payer: Self-pay | Admitting: Family Medicine

## 2022-12-25 ENCOUNTER — Ambulatory Visit (INDEPENDENT_AMBULATORY_CARE_PROVIDER_SITE_OTHER): Payer: Medicare HMO | Admitting: Family Medicine

## 2022-12-25 VITALS — HR 96 | Ht 68.0 in | Wt 219.4 lb

## 2022-12-25 DIAGNOSIS — R1012 Left upper quadrant pain: Secondary | ICD-10-CM

## 2022-12-25 DIAGNOSIS — Z125 Encounter for screening for malignant neoplasm of prostate: Secondary | ICD-10-CM | POA: Diagnosis not present

## 2022-12-25 DIAGNOSIS — E1159 Type 2 diabetes mellitus with other circulatory complications: Secondary | ICD-10-CM | POA: Diagnosis not present

## 2022-12-25 DIAGNOSIS — R634 Abnormal weight loss: Secondary | ICD-10-CM | POA: Diagnosis not present

## 2022-12-25 DIAGNOSIS — Z7984 Long term (current) use of oral hypoglycemic drugs: Secondary | ICD-10-CM | POA: Diagnosis not present

## 2022-12-25 LAB — CBC WITH DIFFERENTIAL/PLATELET
Absolute Monocytes: 585 cells/uL (ref 200–950)
Basophils Absolute: 40 cells/uL (ref 0–200)
Basophils Relative: 0.5 %
Eosinophils Absolute: 253 cells/uL (ref 15–500)
Eosinophils Relative: 3.2 %
HCT: 36.7 % — ABNORMAL LOW (ref 38.5–50.0)
Hemoglobin: 10.9 g/dL — ABNORMAL LOW (ref 13.2–17.1)
Lymphs Abs: 2433 cells/uL (ref 850–3900)
MCH: 21.5 pg — ABNORMAL LOW (ref 27.0–33.0)
MCHC: 29.7 g/dL — ABNORMAL LOW (ref 32.0–36.0)
MCV: 72.2 fL — ABNORMAL LOW (ref 80.0–100.0)
MPV: 9.4 fL (ref 7.5–12.5)
Monocytes Relative: 7.4 %
Neutro Abs: 4590 cells/uL (ref 1500–7800)
Neutrophils Relative %: 58.1 %
Platelets: 345 10*3/uL (ref 140–400)
RBC: 5.08 10*6/uL (ref 4.20–5.80)
RDW: 18.4 % — ABNORMAL HIGH (ref 11.0–15.0)
Total Lymphocyte: 30.8 %
WBC: 7.9 10*3/uL (ref 3.8–10.8)

## 2022-12-25 MED ORDER — SUCRALFATE 1 G PO TABS: 1.0000 g | ORAL_TABLET | Freq: Three times a day (TID) | ORAL | 0 refills | Status: DC

## 2022-12-25 NOTE — Progress Notes (Signed)
Subjective:    Patient ID: Chris [name]., male    DOB: 09-23-1940, 82 y.o.   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  First, the patient has 10 pounds of unintentional weight loss over the last month or so.  He reports near daily left upper quadrant pain.  He states that his intense pain is there all day long.  The pain is located inferior to his ribs and the left side.  Food intensifies the pain.  He also reports explosive diarrhea as soon as he eats.  He has been taking Pepto-Bismol on a daily basis.  As result he reports black stools.  He denies fever or chills.  He is taking pantoprazole.  He is also taking metformin.  He is not taking Ozempic.  He stopped this on his own in December.  Wt Readings from Last 3 Encounters:  12/25/22 219 lb 6.4 oz (99.5 kg)  05/14/22 229 lb (103.9 kg)  05/01/22 228 lb (103.4 kg)    Past Medical History:  Diagnosis Date   Abnormal exercise myocardial perfusion study    03/2018- 38% ef, see report   Arthritis    Chronic kidney disease    Congestive heart failure (CHF) (HCC) 09/2015   Coronary artery disease    DOE (dyspnea on exertion)    ED (erectile dysfunction)    GERD (gastroesophageal reflux disease)    occ   Gout    H/O hiatal hernia    Hyperlipidemia    Hypertension    Myocardial infarction (HCC)    Neuropathy    toes   Obesity    RBBB (right bundle branch block)    Stenosis of right internal carotid artery    Type II diabetes mellitus (HCC)    Umbilical hernia    a. s/p repair.   Vertigo     Past Surgical History:  Procedure Laterality Date   ANKLE ARTHROSCOPY WITH DRILLING/MICROFRACTURE Left 04/13/2013   Procedure: LEFT ANKLE  ARTHROSCOPY WITH EXTENSIVE DEBRIEDMENT;  Surgeon: Toni Arthurs, MD;  Location: [facility];  Service: Orthopedics;  Laterality: Left;   ANKLE SURGERY Left 08/18/2013   DR HEWITT   CARDIAC CATHETERIZATION  2000   stents x3   CARDIAC CATHETERIZATION  02/15/2014   Procedure: LEFT HEART CATH AND CORS/GRAFTS ANGIOGRAPHY;  Surgeon: Pamella Pert, MD;  Location: [facility] CATH LAB;  Service: Cardiovascular;;   COLONOSCOPY     CORONARY ARTERY BYPASS GRAFT  1995   LIMA to LAD, SVG to RCA, SVG to OM.    CORONARY STENT INTERVENTION N/A 08/12/2020   Procedure: CORONARY STENT INTERVENTION;  Surgeon: Elder Negus, MD;  Location: [facility] INVAS[facility] CV LAB;  Service: Cardiovascular;  Laterality: N/A;   CORONARY STENT INTERVENTION N/A 09/04/2021   Procedure: CORONARY STENT INTERVENTION;  Surgeon: Yates Decamp, MD;  Location: [facility] CV [facility];  Service: Cardiovascular;  Laterality: N/A;   LEFT HEART CATH AND CORS/GRAFTS ANGIOGRAPHY N/A 08/12/2020   Procedure: LEFT HEART CATH AND CORS/GRAFTS ANGIOGRAPHY;  Surgeon: Elder Negus, MD;  [facility]: [facility];  Service: Cardiovascular;  Laterality: N/A;   LEFT HEART CATH AND CORS/GRAFTS ANGIOGRAPHY N/A 09/04/2021   Procedure: LEFT HEART CATH AND CORS/GRAFTS ANGIOGRAPHY;  Surgeon:  Yates Decamp, MD;  Location: [facility] INVASIVE CV LAB;  Service: Cardiovascular;  Laterality: N/A;   TOTAL ANKLE ARTHROPLASTY Left 08/17/2013   Procedure: LEFT TOTAL ANKLE REPLACEMENT WITH POSSIBLE GASTROC RECESSION ;  Surgeon: Toni Arthurs, MD;  Location: [facility];  Service: Orthopedics;  Laterality: Left;   UMBILICAL HERNIA REPAIR  12/2007   Current Outpatient Medications on File Prior to Visit  Medication Sig Dispense Refill   acetaminophen (TYLENOL) 500 MG tablet Take 500 mg by mouth every 6 (six) hours as needed for mild pain.     aspirin EC 81 MG tablet Take 1 tablet (81 mg total) by mouth daily.     Blood Glucose Monitoring Suppl DEVI 1 each by Does not apply route in the  morning, at noon, and at bedtime. May substitute to any manufacturer covered by patient's insurance. 1 each 0   clopidogrel (PLAVIX) 75 MG tablet TAKE 1 TABLET EVERY DAY 90 tablet 3   empagliflozin (JARDIANCE) 25 MG TABS tablet TAKE 1 TABLET EVERY DAY BEFORE BREAKFAST 90 tablet 0   EPINEPHRINE 0.3 mg/0.3 mL IJ SOAJ injection INJECT 1 PEN ( 0.3 MG ) INTO THE MUSCLE AS NEEDED FOR ANAPHYLAXIS. 2 each 3   furosemide (LASIX) 20 MG tablet Take 1 tablet (20 mg total) by mouth daily. 30 tablet 3   metFORMIN (GLUCOPHAGE-XR) 500 MG 24 hr tablet TAKE 1 TABLET TWICE DAILY 180 tablet 3   nitroGLYCERIN (NITROSTAT) 0.4 MG SL tablet Place 1 tablet (0.4 mg total) under the tongue every 5 (five) minutes as needed for chest pain. 15 tablet 3   pantoprazole (PROTONIX) 40 MG tablet TAKE 1 TABLET EVERY DAY 90 tablet 3   rosuvastatin (CRESTOR) 40 MG tablet Take 1 tablet (40 mg total) by mouth daily. 30 tablet 0   Semaglutide, 1 MG/DOSE, 4 MG/3ML SOPN Inject 1 mg into the skin once a week. 9 mL 1   No current facility-administered medications on file prior to visit.   Allergies  Allergen Reactions   Brilinta [Ticagrelor] Shortness Of Breath   Alpha-Gal Hives   Crestor [Rosuvastatin Calcium] Other (See Comments)    Muscle pain- tolerating this 2022, however   Lipitor [Atorvastatin] Other (See Comments)    Intolerable muscle pain all over    Social History   Socioeconomic History   Marital status: Married    Spouse name: Not on file   Number of children: 4   Years of education: Not on file   Highest education level: Not on file  Occupational History   Not on file  Tobacco Use   Smoking status: Former    Current packs/day: 0.00    Average packs/day: 1 pack/day for 9.0 years (9.0 ttl pk-yrs)    Types: Cigarettes    Start date: 10/30/1961    Quit date: 10/31/1970    Years since quitting: 52.1   Smokeless tobacco: Former    Types: Chew   Tobacco comments:    chewed for 2 years in his 20's  Vaping Use    Vaping status: Never Used  Substance and Sexual Activity   Alcohol use: Not Currently    Comment: occ wine last 6 months   Drug use: No   Sexual activity: Yes  Other Topics Concern   Not on file  Social History Narrative   Lives in [place] with wife.  Retired.   Social Determinants of Health   Financial Resource Strain: Low Risk  (05/14/2022)   Overall Financial Resource Strain (CARDIA)    Difficulty  of Paying Living Expenses: Not hard at all  Food Insecurity: No Food Insecurity (05/14/2022)   Hunger Vital Sign    Worried About Running Out of Food in the Last Year: Never true    Ran Out of Food in the Last Year: Never true  Transportation Needs: No Transportation Needs (05/14/2022)   PRAPARE - Administrator, Civil Service (Medical): No    Lack of Transportation (Non-Medical): No  Physical Activity: Insufficiently Active (05/14/2022)   Exercise Vital Sign    Days of Exercise per Week: 3 days    Minutes of Exercise per Session: 30 min  Stress: No Stress Concern Present (05/14/2022)   [employer] of Occupational Health - Occupational Stress Questionnaire    Feeling of Stress : Only a little  Social Connections: Socially Integrated (05/14/2022)   Social Connection and Isolation Panel [NHANES]    Frequency of Communication with Friends and Family: More than three times a week    Frequency of Social Gatherings with Friends and Family: Three times a week    Attends Religious Services: More than 4 times per year    Active Member of Clubs or Organizations: Yes    Attends Banker Meetings: More than 4 times per year    Marital Status: Married  Catering manager Violence: Not At Risk (05/14/2022)   Humiliation, Afraid, Rape, and Kick questionnaire    Fear of Current or Ex-Partner: No    Emotionally Abused: No    Physically Abused: No    Sexually Abused: No     Review of Systems  All other systems reviewed and are negative.      Objective:    Physical Exam Vitals reviewed.  Constitutional:      General: He is not in acute distress.    Appearance: He is obese. He is not ill-appearing or toxic-appearing.  Cardiovascular:     Rate and Rhythm: Normal rate and regular rhythm.     Heart sounds: No murmur heard.    No friction rub. No gallop.  Pulmonary:     Effort: Pulmonary effort is normal. No respiratory distress.     Breath sounds: No stridor. No wheezing, rhonchi or rales.  Abdominal:     General: Abdomen is flat. Bowel sounds are normal. There is no distension.     Palpations: Abdomen is soft.     Tenderness: There is abdominal tenderness in the left upper quadrant. There is no guarding.  Musculoskeletal:     Right lower leg: No edema.     Left lower leg: No edema.  Skin:    Findings: No erythema or rash.  Neurological:     Mental Status: He is alert.           Assessment & Plan:  Unintentional weight loss - Plan: CBC with Differential/Platelet, COMPLETE METABOLIC PANEL WITH GFR, Hemoglobin A1c, Lipase  Type 2 diabetes mellitus with other circulatory complication, without long-term current use of insulin (HCC) - Plan: CBC with Differential/Platelet, COMPLETE METABOLIC PANEL WITH GFR, Hemoglobin A1c  LUQ pain - Plan: CBC with Differential/Platelet, COMPLETE METABOLIC PANEL WITH GFR, Hemoglobin A1c, Lipase  Prostate cancer screening - Plan: PSA Patient presents with 10 pounds of unintentional weight loss over the last month or so, daily left upper quadrant pain, black stool, and explosive diarrhea.  Start by discontinuing metformin to see if this could be the cause of the diarrhea.  I am concerned about gastritis versus peptic ulcer disease.  Increase pantoprazole to 40  mg twice daily and add sucralfate 1 g p.o. q. ACH S.  Recheck in 1 week to see if symptoms are improving.  Given the weight loss and the pain, I want to schedule the patient for a CAT scan to rule out malignancy.  Meanwhile check baseline lab work  including a CBC CMP A1c and given the left upper quadrant pain check a lipase.  Patient also requested a screen for prostate cancer with a PSA

## 2022-12-25 NOTE — Progress Notes (Signed)
Patient previously followed by [facility] pharmacist. Per clinical review, no pharmacist appointment needed at this time.

## 2022-12-29 ENCOUNTER — Encounter: Payer: Self-pay | Admitting: Family Medicine

## 2022-12-31 ENCOUNTER — Encounter: Payer: Medicare HMO | Admitting: Pharmacist

## 2023-01-05 ENCOUNTER — Ambulatory Visit
Admission: RE | Admit: 2023-01-05 | Discharge: 2023-01-05 | Disposition: A | Discharge status: Home / Self Care | Payer: Medicare HMO | Source: Ambulatory Visit | Attending: Family Medicine | Admitting: Family Medicine

## 2023-01-05 DIAGNOSIS — R634 Abnormal weight loss: Secondary | ICD-10-CM | POA: Diagnosis not present

## 2023-01-05 DIAGNOSIS — R1012 Left upper quadrant pain: Secondary | ICD-10-CM

## 2023-01-05 DIAGNOSIS — N281 Cyst of kidney, acquired: Secondary | ICD-10-CM | POA: Diagnosis not present

## 2023-01-05 DIAGNOSIS — K8020 Calculus of gallbladder without cholecystitis without obstruction: Secondary | ICD-10-CM | POA: Diagnosis not present

## 2023-01-05 DIAGNOSIS — K449 Diaphragmatic hernia without obstruction or gangrene: Secondary | ICD-10-CM | POA: Diagnosis not present

## 2023-01-05 MED ORDER — IOPAMIDOL (ISOVUE-300) INJECTION 61%
100.0000 mL | Freq: Once | INTRAVENOUS | Status: AC | PRN
  Administered 2023-01-05: 100 mL via INTRAVENOUS

## 2023-01-06 ENCOUNTER — Other Ambulatory Visit: Payer: Medicare HMO

## 2023-01-06 NOTE — Telephone Encounter (Signed)
No action done

## 2023-01-28 ENCOUNTER — Ambulatory Visit (INDEPENDENT_AMBULATORY_CARE_PROVIDER_SITE_OTHER): Payer: Medicare HMO | Admitting: Family Medicine

## 2023-01-28 VITALS — BP 118/56 | HR 101 | Temp 97.6°F | Ht 68.0 in | Wt 223.0 lb

## 2023-01-28 DIAGNOSIS — R634 Abnormal weight loss: Secondary | ICD-10-CM

## 2023-01-28 DIAGNOSIS — R1012 Left upper quadrant pain: Secondary | ICD-10-CM

## 2023-01-28 MED ORDER — SITAGLIPTIN PHOSPHATE 100 MG PO TABS: 100.0000 mg | ORAL_TABLET | Freq: Every day | ORAL | 5 refills | Status: DC

## 2023-01-28 NOTE — Progress Notes (Signed)
Subjective:    Patient ID: Chris [name]., male    DOB: 01/19/1941, 82 y.o.   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  First, the patient has 10 pounds of unintentional weight loss over the last month or so.  He reports near daily left upper quadrant pain.  He states that his intense pain is there all day long.  The pain is located inferior to his ribs and the left side.  Food intensifies the pain.  He also reports explosive diarrhea as soon as he eats.  He has been taking Pepto-Bismol on a daily basis.  As result he reports black stools.  He denies fever or chills.  He is taking pantoprazole.  He is also taking metformin.  He is not taking Ozempic.  He stopped this on his own in December.  At that time, my plan was: Patient presents with 10 pounds of unintentional weight loss over the last month or so, daily left upper quadrant pain, black stool, and explosive diarrhea.  Start by discontinuing metformin to see if this could be the cause of the diarrhea.  I am concerned about gastritis versus peptic ulcer disease.  Increase pantoprazole to 40 mg twice daily and add sucralfate 1 g p.o. q. ACH S.  Recheck in 1 week to see if symptoms are improving.  Given the weight loss and the pain, I want to schedule the patient for a CAT scan to rule out malignancy.  Meanwhile check baseline lab work including a CBC CMP A1c and given the left upper quadrant pain check a lipase.  Patient also requested a screen for prostate cancer with a PSA  01/28/23 CT scan showed: IMPRESSION: 1. 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 2. Large paraesophageal hiatal hernia. 3. Right kidney cyst. 4. Diverticulosis 5.  Mildly prominent prostate.  Wt Readings from Last 3 Encounters:  01/28/23 223 lb (101.2 kg)  12/25/22 219 lb 6.4 oz (99.5 kg)  05/14/22 229 lb (103.9 kg)   Patient states that the left upper quadrant abdominal pain has resolved.  He is no longer having any chest pain.  He states he feels much better.  His weight has improved as his appetite has returned.  He denies any melena or hematochezia.  He denies any nausea or vomiting.  He is still taking pantoprazole along with sucralfate.  The diarrhea has also subsided.  He is no longer on metformin. Past Medical History:  Diagnosis Date   Abnormal exercise myocardial perfusion study    03/2018- 38% ef, see report   Arthritis    Chronic kidney disease    Congestive heart failure (CHF) (HCC) 09/2015   Coronary artery disease    DOE (dyspnea on exertion)    ED (erectile dysfunction)    GERD (gastroesophageal reflux disease)    occ   Gout    H/O hiatal hernia    Hyperlipidemia    Hypertension    Myocardial infarction (HCC)    Neuropathy    toes   Obesity    RBBB (right bundle branch block)    Stenosis of right internal carotid artery    Type II diabetes mellitus (HCC)    Umbilical hernia    a. s/p  repair.   Vertigo     Past Surgical History:  Procedure Laterality Date   ANKLE ARTHROSCOPY WITH DRILLING/MICROFRACTURE Left 04/13/2013   Procedure: LEFT ANKLE ARTHROSCOPY WITH EXTENSIVE DEBRIEDMENT;  Surgeon: Toni Arthurs, MD;  Location:  SURGERY CENTER;  Service: Orthopedics;  Laterality: Left;   ANKLE SURGERY Left 08/18/2013   DR HEWITT   CARDIAC CATHETERIZATION  2000   stents x3   CARDIAC CATHETERIZATION  02/15/2014   Procedure: LEFT HEART CATH AND CORS/GRAFTS ANGIOGRAPHY;  Surgeon: Pamella Pert, MD;  Location: [facility] CATH LAB;  Service: Cardiovascular;;   COLONOSCOPY     CORONARY ARTERY BYPASS GRAFT  1995   LIMA to LAD, SVG to RCA, SVG to OM.    CORONARY STENT INTERVENTION N/A 08/12/2020   Procedure: CORONARY STENT  INTERVENTION;  Surgeon: Elder Negus, MD;  [facility]: [facility] CV [facility];  Service: Cardiovascular;  Laterality: N/A;   CORONARY STENT INTERVENTION N/A 09/04/2021   Procedure: CORONARY STENT INTERVENTION;  Surgeon: Yates Decamp, MD;  [facility]: [facility];  Service: Cardiovascular;  Laterality: N/A;   LEFT HEART CATH AND CORS/GRAFTS ANGIOGRAPHY N/A 08/12/2020   Procedure: LEFT HEART CATH AND CORS/GRAFTS ANGIOGRAPHY;  Surgeon: Elder Negus, MD;  [facility]: [facility];  Service: Cardiovascular;  Laterality: N/A;   LEFT HEART CATH AND CORS/GRAFTS ANGIOGRAPHY N/A 09/04/2021   Procedure: LEFT HEART CATH AND CORS/GRAFTS ANGIOGRAPHY;  Surgeon: Yates Decamp, MD;  [facility]: [facility];  Service: Cardiovascular;  Laterality: N/A;   TOTAL ANKLE ARTHROPLASTY Left 08/17/2013   Procedure: LEFT TOTAL ANKLE REPLACEMENT WITH POSSIBLE GASTROC RECESSION ;  Surgeon: Toni Arthurs, MD;  Location: [facility] OR;  Service: Orthopedics;  Laterality: Left;   UMBILICAL HERNIA REPAIR  12/2007   Current Outpatient Medications on File Prior to Visit  Medication Sig Dispense Refill   acetaminophen (TYLENOL) 500 MG tablet Take 500 mg by mouth every 6 (six) hours as needed for mild pain.     aspirin EC 81 MG tablet Take 1 tablet (81 mg total) by mouth daily.     Blood Glucose Monitoring Suppl DEVI 1 each by Does not apply route in the morning, at noon, and at bedtime. May substitute to any manufacturer covered by patient's insurance. 1 each 0   clopidogrel (PLAVIX) 75 MG tablet TAKE 1 TABLET EVERY DAY 90 tablet 3   empagliflozin (JARDIANCE) 25 MG TABS tablet TAKE 1 TABLET EVERY DAY BEFORE BREAKFAST 90 tablet 0   furosemide (LASIX) 20 MG tablet Take 1 tablet (20 mg total) by mouth daily. 30 tablet 3   nitroGLYCERIN (NITROSTAT) 0.4 MG SL tablet Place 1 tablet (0.4 mg total) under the tongue every 5 (five) minutes as needed for chest pain. 15 tablet 3   pantoprazole (PROTONIX) 40 MG tablet TAKE 1 TABLET EVERY DAY  90 tablet 3   rosuvastatin (CRESTOR) 40 MG tablet Take 1 tablet (40 mg total) by mouth daily. 30 tablet 0   Semaglutide, 1 MG/DOSE, 4 MG/3ML SOPN Inject 1 mg into the skin once a week. 9 mL 1   sucralfate (CARAFATE) 1 g tablet Take 1 tablet (1 g total) by mouth 4 (four) times daily -  with meals and at bedtime. 120 tablet 0   metFORMIN (GLUCOPHAGE-XR) 500 MG 24 hr tablet TAKE 1 TABLET TWICE DAILY (Patient not taking: Reported on 01/28/2023) 180 tablet 3   No current facility-administered medications on file prior to visit.   Allergies  Allergen Reactions   Brilinta [Ticagrelor] Shortness Of  Breath   Alpha-Gal Hives   Crestor [Rosuvastatin Calcium] Other (See Comments)    Muscle pain- tolerating this 2022, however   Lipitor [Atorvastatin] Other (See Comments)    Intolerable muscle pain all over    Social History   Socioeconomic History   Marital status: Married    Spouse name: Not on file   Number of children: 4   Years of education: Not on file   Highest education level: Not on file  Occupational History   Not on file  Tobacco Use   Smoking status: Former    Current packs/day: 0.00    Average packs/day: 1 pack/day for 9.0 years (9.0 ttl pk-yrs)    Types: Cigarettes    Start date: 10/30/1961    Quit date: 10/31/1970    Years since quitting: 52.2   Smokeless tobacco: Former    Types: Chew   Tobacco comments:    chewed for 2 years in his 20's  Vaping Use   Vaping status: Never Used  Substance and Sexual Activity   Alcohol use: Not Currently    Comment: occ wine last 6 months   Drug use: No   Sexual activity: Yes  Other Topics Concern   Not on file  Social History Narrative   Lives in [place] with wife.  Retired.   Social Determinants of Health   Financial Resource Strain: Low Risk  (05/14/2022)   Overall Financial Resource Strain (CARDIA)    Difficulty of Paying Living Expenses: Not hard at all  Food Insecurity: No Food Insecurity (05/14/2022)   Hunger Vital Sign     Worried About Running Out of Food in the Last Year: Never true    Ran Out of Food in the Last Year: Never true  Transportation Needs: No Transportation Needs (05/14/2022)   PRAPARE - Administrator, Civil Service (Medical): No    Lack of Transportation (Non-Medical): No  Physical Activity: Insufficiently Active (05/14/2022)   Exercise Vital Sign    Days of Exercise per Week: 3 days    Minutes of Exercise per Session: 30 min  Stress: No Stress Concern Present (05/14/2022)   [employer] of Occupational Health - Occupational Stress Questionnaire    Feeling of Stress : Only a little  Social Connections: Socially Integrated (05/14/2022)   Social Connection and Isolation Panel [NHANES]    Frequency of Communication with Friends and Family: More than three times a week    Frequency of Social Gatherings with Friends and Family: Three times a week    Attends Religious Services: More than 4 times per year    Active Member of Clubs or Organizations: Yes    Attends Banker Meetings: More than 4 times per year    Marital Status: Married  Catering manager Violence: Not At Risk (05/14/2022)   Humiliation, Afraid, Rape, and Kick questionnaire    Fear of Current or Ex-Partner: No    Emotionally Abused: No    Physically Abused: No    Sexually Abused: No     Review of Systems  All other systems reviewed and are negative.      Objective:   Physical Exam Vitals reviewed.  Constitutional:      General: He is not in acute distress.    Appearance: He is obese. He is not ill-appearing or toxic-appearing.  Cardiovascular:     Rate and Rhythm: Normal rate and regular rhythm.     Heart sounds: No murmur heard.    No friction rub.  No gallop.  Pulmonary:     Effort: Pulmonary effort is normal. No respiratory distress.     Breath sounds: No stridor. No wheezing, rhonchi or rales.  Abdominal:     General: Abdomen is flat. Bowel sounds are normal. There is no  distension.     Palpations: Abdomen is soft.     Tenderness: There is no abdominal tenderness. There is no guarding.  Musculoskeletal:     Right lower leg: No edema.     Left lower leg: No edema.  Skin:    Findings: No erythema or rash.  Neurological:     Mental Status: He is alert.           Assessment & Plan:  Unintentional weight loss  LUQ pain I suspect the patient had an ulcer.  Recommended he continue sucralfate for a total of 8 weeks.  Then discontinue the medication.  Continue pantoprazole twice daily indefinitely given his hiatal hernia.  Recommended switching metformin to Januvia 100 mg daily for diabetes given the diarrhea that he was experiencing with metformin.  He will check on the price.

## 2023-01-31 ENCOUNTER — Other Ambulatory Visit: Payer: Self-pay | Admitting: Family Medicine

## 2023-02-01 ENCOUNTER — Other Ambulatory Visit: Payer: Self-pay | Admitting: Family Medicine

## 2023-02-01 DIAGNOSIS — I5042 Chronic combined systolic (congestive) and diastolic (congestive) heart failure: Secondary | ICD-10-CM

## 2023-02-01 DIAGNOSIS — I6521 Occlusion and stenosis of right carotid artery: Secondary | ICD-10-CM

## 2023-02-01 DIAGNOSIS — I25110 Atherosclerotic heart disease of native coronary artery with unstable angina pectoris: Secondary | ICD-10-CM

## 2023-02-01 NOTE — Telephone Encounter (Signed)
Prescription Request  02/01/2023  LOV: 01/28/2023  What is the name of the medication or equipment? furosemide (LASIX) 20 MG tablet   Have you contacted your pharmacy to request a refill? Yes   Which pharmacy would you like this sent to?  [facility] - [place], [place] - [address] [place] [address] Phone: [phone] Fax: [phone]    Patient notified that their request is being sent to the clinical staff for review and that they should receive a response within 2 business days.   Please advise at [facility] [phone]

## 2023-02-02 NOTE — Telephone Encounter (Signed)
Requested Prescriptions  Pending Prescriptions Disp Refills   sucralfate (CARAFATE) 1 g tablet [Pharmacy Med Name: SUCRALFATE 1GM TABLETS] 360 tablet 0    Sig: TAKE 1 TABLET(1 GRAM) BY MOUTH FOUR TIMES DAILY, WITH MEALS AND AT BEDTIME     Gastroenterology: Antiacids Failed - 01/31/2023 10:52 AM      Failed - Valid encounter within last 12 months    Recent Outpatient Visits           1 year ago ASCVD (arteriosclerotic cardiovascular disease)   [facility] Donita Brooks, MD   1 year ago Hiatal hernia   [facility] [name], [name], MD   2 years ago Allergy injection reaction, initial encounter   [facility] Donita Brooks, MD   2 years ago Left ureteral stone   [facility] Donita Brooks, MD   2 years ago Interstitial pulmonary disease (HCC)   [facility] [name], [name], MD

## 2023-02-02 NOTE — Telephone Encounter (Signed)
Requested medications are due for refill today.  yes  Requested medications are on the active medications list.  yes  Last refill. 11/12/2022 #30 3 rf  Future visit scheduled.   no  Notes to clinic.  Missing lab.    Requested Prescriptions  Pending Prescriptions Disp Refills   furosemide (LASIX) 20 MG tablet 30 tablet 3    Sig: Take 1 tablet (20 mg total) by mouth daily.     Cardiovascular:  Diuretics - Loop Failed - 02/01/2023  1:37 PM      Failed - Cr in normal range and within 180 days    Creat  Date Value Ref Range Status  12/25/2022 1.49 (H) 0.70 - 1.28 mg/dL Final   Creatinine, Urine  Date Value Ref Range Status  04/06/2022 CANCELED      Comment:    TEST NOT PERFORMED . No urine received.  Result canceled by the ancillary.          Failed - Mg Level in normal range and within 180 days    Magnesium  Date Value Ref Range Status  05/07/2021 2.2 1.7 - 2.4 mg/dL Final    Comment:    Performed at [facility], [address] [place], [place] [address]         Failed - Valid encounter within last 6 months    Recent Outpatient Visits           1 year ago ASCVD (arteriosclerotic cardiovascular disease)   [facility] Donita Brooks, MD   1 year ago Hiatal hernia   [facility] Tanya Nones, [name], MD   2 years ago Allergy injection reaction, initial encounter   [facility] Donita Brooks, MD   2 years ago Left ureteral stone   [facility] Donita Brooks, MD   2 years ago Interstitial pulmonary disease (HCC)   [facility] [name], [name], MD              Passed - K in normal range and within 180 days    Potassium  Date Value Ref Range Status  12/25/2022 3.9 3.5 - 5.3 mmol/L Final         Passed - Ca in normal range and within 180 days    Calcium  Date Value Ref Range Status  12/25/2022 9.5 8.6 - 10.3 mg/dL Final   Calcium, Ion  Date Value Ref Range  Status  09/04/2021 1.08 (L) 1.15 - 1.40 mmol/L Final         Passed - Na in normal range and within 180 days    Sodium  Date Value Ref Range Status  12/25/2022 141 135 - 146 mmol/L Final  10/02/2021 142 134 - 144 mmol/L Final         Passed - Cl in normal range and within 180 days    Chloride  Date Value Ref Range Status  12/25/2022 104 98 - 110 mmol/L Final         Passed - Last BP in normal range    BP Readings from Last 1 Encounters:  01/28/23 (!) 118/56

## 2023-02-23 ENCOUNTER — Telehealth: Payer: Self-pay

## 2023-02-23 ENCOUNTER — Other Ambulatory Visit: Payer: Self-pay | Admitting: Family Medicine

## 2023-02-23 MED ORDER — ALLOPURINOL 100 MG PO TABS: 100.0000 mg | ORAL_TABLET | Freq: Every day | ORAL | 3 refills | Status: DC

## 2023-02-23 NOTE — Telephone Encounter (Signed)
Prescription Request  02/23/2023  LOV: 01/28/23  What is the name of the medication or equipment? allopurinol (ZYLOPRIM) 100 MG tablet [[id number]]  DISCONTINUED  Have you contacted your pharmacy to request a refill? No   Which pharmacy would you like this sent to?  [facility] - [place], [place] - [address] [place] [address] Phone: [phone] Fax: [phone]    Patient notified that their request is being sent to the clinical staff for review and that they should receive a response within 2 business days.   Please advise at [facility] [phone]  Prescription Request  02/23/2023  LOV: 01/28/23  What is the name of the medication or equipment? furosemide (LASIX) 20 MG tablet [[id number]]   Have you contacted your pharmacy to request a refill? No   Which pharmacy would you like this sent to?  [facility] - [place], [place] - [address] [place] [address] Phone: [phone] Fax: [phone]    Patient notified that their request is being sent to the clinical staff for review and that they should receive a response within 2 business days.   Please advise at [facility] [phone]

## 2023-02-26 ENCOUNTER — Telehealth: Payer: Self-pay | Admitting: Family Medicine

## 2023-02-26 ENCOUNTER — Other Ambulatory Visit: Payer: Self-pay | Admitting: Family Medicine

## 2023-02-26 DIAGNOSIS — Z760 Encounter for issue of repeat prescription: Secondary | ICD-10-CM | POA: Diagnosis not present

## 2023-02-26 DIAGNOSIS — M10362 Gout due to renal impairment, left knee: Secondary | ICD-10-CM | POA: Diagnosis not present

## 2023-02-26 MED ORDER — PREDNISONE 20 MG PO TABS: ORAL_TABLET | ORAL | 0 refills | Status: DC

## 2023-02-26 NOTE — Telephone Encounter (Signed)
Patient called requesting an appointment for Gout. He states it is in his left knee and toe and is unable to walk due to the gout. He is asking for a prescription called into [facility] on [address] in [place].   CB# [phone]

## 2023-03-03 ENCOUNTER — Encounter: Payer: Self-pay | Admitting: Family Medicine

## 2023-03-03 ENCOUNTER — Ambulatory Visit: Payer: Medicare HMO | Admitting: Family Medicine

## 2023-03-03 VITALS — BP 126/72 | HR 88 | Temp 97.6°F | Ht 68.0 in | Wt 224.0 lb

## 2023-03-03 DIAGNOSIS — M109 Gout, unspecified: Secondary | ICD-10-CM

## 2023-03-03 NOTE — Progress Notes (Signed)
Subjective:    Patient ID: Chris [name]., male    DOB: April 06, 1941, 82 y.o.   MRN: [id number]  Patient recently had a gout flare in his left knee.  He went to his urgent care and was given prednisone and colchicine.  Gout flare is 100% resolved now.  He states that he had frequent gout flares when he was younger and drink alcohol.  He used to be on allopurinol but he quit this 2 years ago.  This is his first gout flare in the last 2 years.  He admits to drinking a large beer and also eating a large plate of shrimp prior to the attack. Past Medical History:  Diagnosis Date   Abnormal exercise myocardial perfusion study    03/2018- 38% ef, see report   Arthritis    Chronic kidney disease    Congestive heart failure (CHF) (HCC) 09/2015   Coronary artery disease    DOE (dyspnea on exertion)    ED (erectile dysfunction)    GERD (gastroesophageal reflux disease)    occ   Gout    H/O hiatal hernia    Hyperlipidemia    Hypertension    Myocardial infarction (HCC)    Neuropathy    toes   Obesity    RBBB (right bundle branch block)    Stenosis of right internal carotid artery    Type II diabetes mellitus (HCC)    Umbilical hernia    a. s/p repair.   Vertigo     Past Surgical History:  Procedure Laterality Date   ANKLE ARTHROSCOPY WITH DRILLING/MICROFRACTURE Left 04/13/2013   Procedure: LEFT ANKLE ARTHROSCOPY WITH EXTENSIVE DEBRIEDMENT;  Surgeon: Toni Arthurs, MD;  Location: [facility];  Service: Orthopedics;  Laterality: Left;   ANKLE SURGERY Left 08/18/2013   DR HEWITT   CARDIAC CATHETERIZATION  2000   stents x3   CARDIAC CATHETERIZATION  02/15/2014   Procedure: LEFT HEART CATH AND CORS/GRAFTS ANGIOGRAPHY;  Surgeon: Pamella Pert, MD;  Location: [facility] CATH LAB;  Service: Cardiovascular;;   COLONOSCOPY     CORONARY ARTERY BYPASS GRAFT  1995   LIMA to LAD, SVG to RCA, SVG to OM.    CORONARY STENT INTERVENTION N/A 08/12/2020   Procedure: CORONARY STENT  INTERVENTION;  Surgeon: Elder Negus, MD;  Location: [facility] INVASIVE CV LAB;  Service: Cardiovascular;  Laterality: N/A;   CORONARY STENT INTERVENTION N/A 09/04/2021   Procedure: CORONARY STENT INTERVENTION;  Surgeon: Yates Decamp, MD;  Location: [facility] CV LAB;  Service: Cardiovascular;  Laterality: N/A;   LEFT HEART CATH AND CORS/GRAFTS ANGIOGRAPHY N/A 08/12/2020   Procedure: LEFT HEART CATH AND CORS/GRAFTS ANGIOGRAPHY;  Surgeon: Elder Negus, MD;  Location: [facility];  Service: Cardiovascular;  Laterality: N/A;   LEFT HEART CATH AND CORS/GRAFTS ANGIOGRAPHY N/A 09/04/2021   Procedure: LEFT HEART CATH AND CORS/GRAFTS ANGIOGRAPHY;  Surgeon: Yates Decamp, MD;  Location: [facility];  Service: Cardiovascular;  Laterality: N/A;   TOTAL ANKLE ARTHROPLASTY Left 08/17/2013   Procedure: LEFT TOTAL ANKLE REPLACEMENT WITH POSSIBLE GASTROC RECESSION ;  Surgeon: Toni Arthurs, MD;  Location: [facility] OR;  Service: Orthopedics;  Laterality: Left;   UMBILICAL HERNIA REPAIR  12/2007   Current Outpatient Medications on File Prior to Visit  Medication Sig Dispense Refill   acetaminophen (TYLENOL) 500 MG tablet Take 500 mg by mouth every 6 (six) hours as needed for mild pain.     allopurinol (ZYLOPRIM) 100 MG tablet Take 1 tablet (  100 mg total) by mouth daily. 90 tablet 3   aspirin EC 81 MG tablet Take 1 tablet (81 mg total) by mouth daily.     Blood Glucose Monitoring Suppl DEVI 1 each by Does not apply route in the morning, at noon, and at bedtime. May substitute to any manufacturer covered by patient's insurance. 1 each 0   clopidogrel (PLAVIX) 75 MG tablet TAKE 1 TABLET EVERY DAY 90 tablet 3   colchicine 0.6 MG tablet Take 0.6 mg by mouth daily.     empagliflozin (JARDIANCE) 25 MG TABS tablet TAKE 1 TABLET EVERY DAY BEFORE BREAKFAST 90 tablet 0   furosemide (LASIX) 20 MG tablet Take 1 tablet (20 mg total) by mouth daily. 30 tablet 3   metFORMIN (GLUCOPHAGE-XR) 500 MG 24 hr tablet TAKE 1 TABLET  TWICE DAILY 180 tablet 3   nitroGLYCERIN (NITROSTAT) 0.4 MG SL tablet Place 1 tablet (0.4 mg total) under the tongue every 5 (five) minutes as needed for chest pain. 15 tablet 3   pantoprazole (PROTONIX) 40 MG tablet TAKE 1 TABLET EVERY DAY 90 tablet 3   predniSONE (DELTASONE) 20 MG tablet 3 tabs poqday 1-2, 2 tabs poqday 3-4, 1 tab poqday 5-6 12 tablet 0   rosuvastatin (CRESTOR) 40 MG tablet Take 1 tablet (40 mg total) by mouth daily. 30 tablet 0   Semaglutide, 1 MG/DOSE, 4 MG/3ML SOPN Inject 1 mg into the skin once a week. 9 mL 1   sitaGLIPtin (JANUVIA) 100 MG tablet Take 1 tablet (100 mg total) by mouth daily. 30 tablet 5   sucralfate (CARAFATE) 1 g tablet TAKE 1 TABLET(1 GRAM) BY MOUTH FOUR TIMES DAILY, WITH MEALS AND AT BEDTIME 360 tablet 0   No current facility-administered medications on file prior to visit.   Allergies  Allergen Reactions   Brilinta [Ticagrelor] Shortness Of Breath   Alpha-Gal Hives   Crestor [Rosuvastatin Calcium] Other (See Comments)    Muscle pain- tolerating this 2022, however   Lipitor [Atorvastatin] Other (See Comments)    Intolerable muscle pain all over    Social History   Socioeconomic History   Marital status: Married    Spouse name: Not on file   Number of children: 4   Years of education: Not on file   Highest education level: Not on file  Occupational History   Not on file  Tobacco Use   Smoking status: Former    Current packs/day: 0.00    Average packs/day: 1 pack/day for 9.0 years (9.0 ttl pk-yrs)    Types: Cigarettes    Start date: 10/30/1961    Quit date: 10/31/1970    Years since quitting: 52.3   Smokeless tobacco: Former    Types: Chew   Tobacco comments:    chewed for 2 years in his 20's  Vaping Use   Vaping status: Never Used  Substance and Sexual Activity   Alcohol use: Not Currently    Comment: occ wine last 6 months   Drug use: No   Sexual activity: Yes  Other Topics Concern   Not on file  Social History Narrative    Lives in [place] with wife.  Retired.   Social Determinants of Health   Financial Resource Strain: Low Risk  (05/14/2022)   Overall Financial Resource Strain (CARDIA)    Difficulty of Paying Living Expenses: Not hard at all  Food Insecurity: No Food Insecurity (05/14/2022)   Hunger Vital Sign    Worried About Running Out of Food in the Last  Year: Never true    Ran Out of Food in the Last Year: Never true  Transportation Needs: No Transportation Needs (05/14/2022)   PRAPARE - Administrator, Civil Service (Medical): No    Lack of Transportation (Non-Medical): No  Physical Activity: Insufficiently Active (05/14/2022)   Exercise Vital Sign    Days of Exercise per Week: 3 days    Minutes of Exercise per Session: 30 min  Stress: No Stress Concern Present (05/14/2022)   [employer] of Occupational Health - Occupational Stress Questionnaire    Feeling of Stress : Only a little  Social Connections: Socially Integrated (05/14/2022)   Social Connection and Isolation Panel [NHANES]    Frequency of Communication with Friends and Family: More than three times a week    Frequency of Social Gatherings with Friends and Family: Three times a week    Attends Religious Services: More than 4 times per year    Active Member of Clubs or Organizations: Yes    Attends Banker Meetings: More than 4 times per year    Marital Status: Married  Catering manager Violence: Not At Risk (05/14/2022)   Humiliation, Afraid, Rape, and Kick questionnaire    Fear of Current or Ex-Partner: No    Emotionally Abused: No    Physically Abused: No    Sexually Abused: No     Review of Systems  All other systems reviewed and are negative.      Objective:   Physical Exam Vitals reviewed.  Constitutional:      General: He is not in acute distress.    Appearance: He is obese. He is not ill-appearing or toxic-appearing.  Cardiovascular:     Rate and Rhythm: Normal rate and regular  rhythm.     Heart sounds: No murmur heard.    No friction rub. No gallop.  Pulmonary:     Effort: Pulmonary effort is normal. No respiratory distress.     Breath sounds: No stridor. No wheezing, rhonchi or rales.  Abdominal:     General: Abdomen is flat. Bowel sounds are normal. There is no distension.     Palpations: Abdomen is soft.     Tenderness: There is no abdominal tenderness. There is no guarding.  Musculoskeletal:     Right lower leg: No edema.     Left lower leg: No edema.  Skin:    Findings: No erythema or rash.  Neurological:     Mental Status: He is alert.           Assessment & Plan:  Acute gout of left knee, unspecified cause Gout flare has resolved.  He denies any pain in the now.  There is no erythema or warmth today.  Discussed starting allopurinol for prevention.  I explained to the patient that he would need to take colchicine with allopurinol for the first 3 months.  I explained that changes in the uric acid level can sometimes precipitate gout flares.  Patient understands.  He elects not to start allopurinol today.  Instead he elects to try to work on his diet to try to prevent future exacerbations.  If it occurs again, he will consider starting allopurinol

## 2023-04-01 ENCOUNTER — Telehealth: Payer: Self-pay | Admitting: Cardiology

## 2023-04-01 NOTE — Telephone Encounter (Signed)
Left message for patient to call back  

## 2023-04-01 NOTE — Telephone Encounter (Signed)
Pt c/o Shortness Of Breath: STAT if SOB developed within the last 24 hours or pt is noticeably SOB on the phone  1. Are you currently SOB (can you hear that pt is SOB on the phone)? Yes, cannot hear over the phone   2. How long have you been experiencing SOB? 2-3 weeks   3. Are you SOB when sitting or when up moving around? Moving around   4. Are you currently experiencing any other symptoms? Just feels he can't do anything do to SOB   Wants appt with Dr. Jacinto Halim in regards to this.

## 2023-04-08 ENCOUNTER — Ambulatory Visit: Payer: Medicare HMO | Admitting: Cardiology

## 2023-04-08 ENCOUNTER — Other Ambulatory Visit: Payer: Self-pay | Admitting: Family Medicine

## 2023-04-08 DIAGNOSIS — K469 Unspecified abdominal hernia without obstruction or gangrene: Secondary | ICD-10-CM

## 2023-04-08 NOTE — Progress Notes (Deleted)
Cardiology Office Note:  .   Date:  04/08/2023  ID:  Chris [name]., DOB 1941-05-19, MRN [id number] PCP: Donita Brooks, MD  [facility] Providers Cardiologist:  None { Click to update primary MD,subspecialty MD or APP then REFRESH:1}   History of Present Illness: Chris Ore Kopf Montez Hageman. is a 82 y.o. male with a past medical history of hypertension, hyperlipidemia, diabetes, history of stroke with no residual deficits, carotid artery stenosis, IPF, CAD status post CABG with multiple subsequent PCI's, who returns today to follow-up on his coronary artery disease.   Coronary artery disease with history of MI with CABG (1995) x3 vessels with LIMA to the LAD, SVG to RCA, SVG to OM, status post multiple coronary interventions.cardiac catheterization in 2000 with stent was placed x 3.  Repeat left heart catheterization in 01/2014 with grafts without changes.  He can underwent PCI to high-grade stenosis of the SVG to RCA in March 2022 and again in April 2023 needing aspiration thrombectomy followed by repeat stenting to the SVG to RCA who presented with an NSTEMI, EF had normalized from 35-40 to normal in May 2023.  Carotid duplex completed 09/2021 revealed no significant change from the 2021 study with follow-up recommended in 6 months.  He showed right ICA 50 to 69% and left ICA 1-15%.  The study was completed in 04/24/2022 which revealed right ICA of 60-49%, and left ICA 60-49%, recommended repeat study yearly.   He was last seen in clinic 05/01/2022 by Dr. Jacinto Halim.  At that time he was doing well with no recurrence of angina.  Continued to have chronic dyspnea.  He was continued on long-term Plavix.  He was started on Ozempic 0.25 mg will uptitrate to maximum dose as tolerated.  No further medication changes were made and no further testing was ordered.  He returns to clinic today ROS: 10 point review of systems has been reviewed and considered negative except what is listed in the  HPI  Studies Reviewed: Marland Kitchen        Left Heart Catheterization 09/04/21:  LV: 121/10, EDP 26 mmHg.  Ao 118/62, mean 83 mmHg.  No pressure gradient across the aortic valve. LM: Large vessel, mild disease. LAD: Occluded in the midsegment.  Distal segment supplied by LIMA to LAD which is widely patent. LCx: Occluded in the proximal to mid segment.  OM1 is supplied by SVG to OM which is widely patent. RCA: Occluded in the midsegment just proximal to previously placed stent.  SVG to RCA is patent, previously placed 3.5 x 38 mm resolute Onyx DES is widely patent in the distal RCA, proximal to mid 38 mm Taxus DES placed in May 2010 has a thrombotic 80% stenosis, focal.  There is mild luminal loss throughout the stent.   Intervention data: Successful filter wire protected aspiration thrombectomy followed by stenting of the proximal segment of the SVG to RCA with implantation of a 4.0 x 12 mm resolute frontier DES at 18 atmospheric pressure, post stenosis 0%.  TIMI-3 to TIMI-3  flow maintained.   Echocardiogram 10/15/2021:  Normal LV systolic function with visual EF 55-60%. Left ventricle cavity  is normal in size. Normal left ventricular wall thickness. Normal global  wall motion. Abnormal septal wall motion due to post-operative septum.  Normal diastolic filling pattern, normal LAP.  Pericardium is normal. Insignificant pericardial effusion. There is no hemodynamic significance.  Compared to 11/06/2020 LVEF 35-40% and RWMA have now improved, otherwise no significant change.  Carotid artery duplex 04/24/2022: Duplex suggests stenosis in the right internal carotid artery (16-49%). Duplex suggests stenosis in the left internal carotid artery (16-49%). Compared to the study done on 10/15/2021, right ICA stenosis of >50% normal appears to have regressed. Follow up in one year is appropriate if clinically indicated. Risk Assessment/Calculations:     No BP recorded.  {Refresh Note OR Click here to enter  BP  :1}***       Physical Exam:   VS:  There were no vitals taken for this visit.   Wt Readings from Last 3 Encounters:  03/03/23 224 lb (101.6 kg)  01/28/23 223 lb (101.2 kg)  12/25/22 219 lb 6.4 oz (99.5 kg)    GEN: Well nourished, well developed in no acute distress NECK: No JVD; No carotid bruits CARDIAC: ***RRR, + murmurs, rubs, gallops RESPIRATORY:  Clear to auscultation without rales, wheezing or rhonchi  ABDOMEN: Soft, non-tender, non-distended EXTREMITIES:  No edema; No deformity   ASSESSMENT AND PLAN: .   ***    {Are you ordering a CV Procedure (e.g. stress test, cath, DCCV, TEE, etc)?   Press F2        :[id number]}  Dispo: ***  Signed, Mishon Blubaugh, NP

## 2023-04-08 NOTE — Telephone Encounter (Signed)
Requested Prescriptions  Refused Prescriptions Disp Refills   pantoprazole (PROTONIX) 40 MG tablet [Pharmacy Med Name: Pantoprazole Sodium Oral Tablet Delayed Release 40 MG] 90 tablet 3    Sig: TAKE 1 TABLET EVERY DAY     Gastroenterology: Proton Pump Inhibitors Failed - 04/08/2023  2:55 AM      Failed - Valid encounter within last 12 months    Recent Outpatient Visits           1 year ago ASCVD (arteriosclerotic cardiovascular disease)   [facility] Donita Brooks, MD   1 year ago Hiatal hernia   [facility] [name], [name], MD   2 years ago Allergy injection reaction, initial encounter   [facility] Donita Brooks, MD   2 years ago Left ureteral stone   [facility] Donita Brooks, MD   2 years ago Interstitial pulmonary disease (HCC)   [facility] [name], [name], MD       Future Appointments             Today Charlsie Quest, NP [facility]

## 2023-04-15 IMAGING — DX DG CHEST 1V PORT
1 series · 1 of 1 positions shown · non-contrast
Comparison: May 13, 2021.

CLINICAL DATA: Shortness of breath.

EXAM:
PORTABLE CHEST 1 VIEW

[chest]
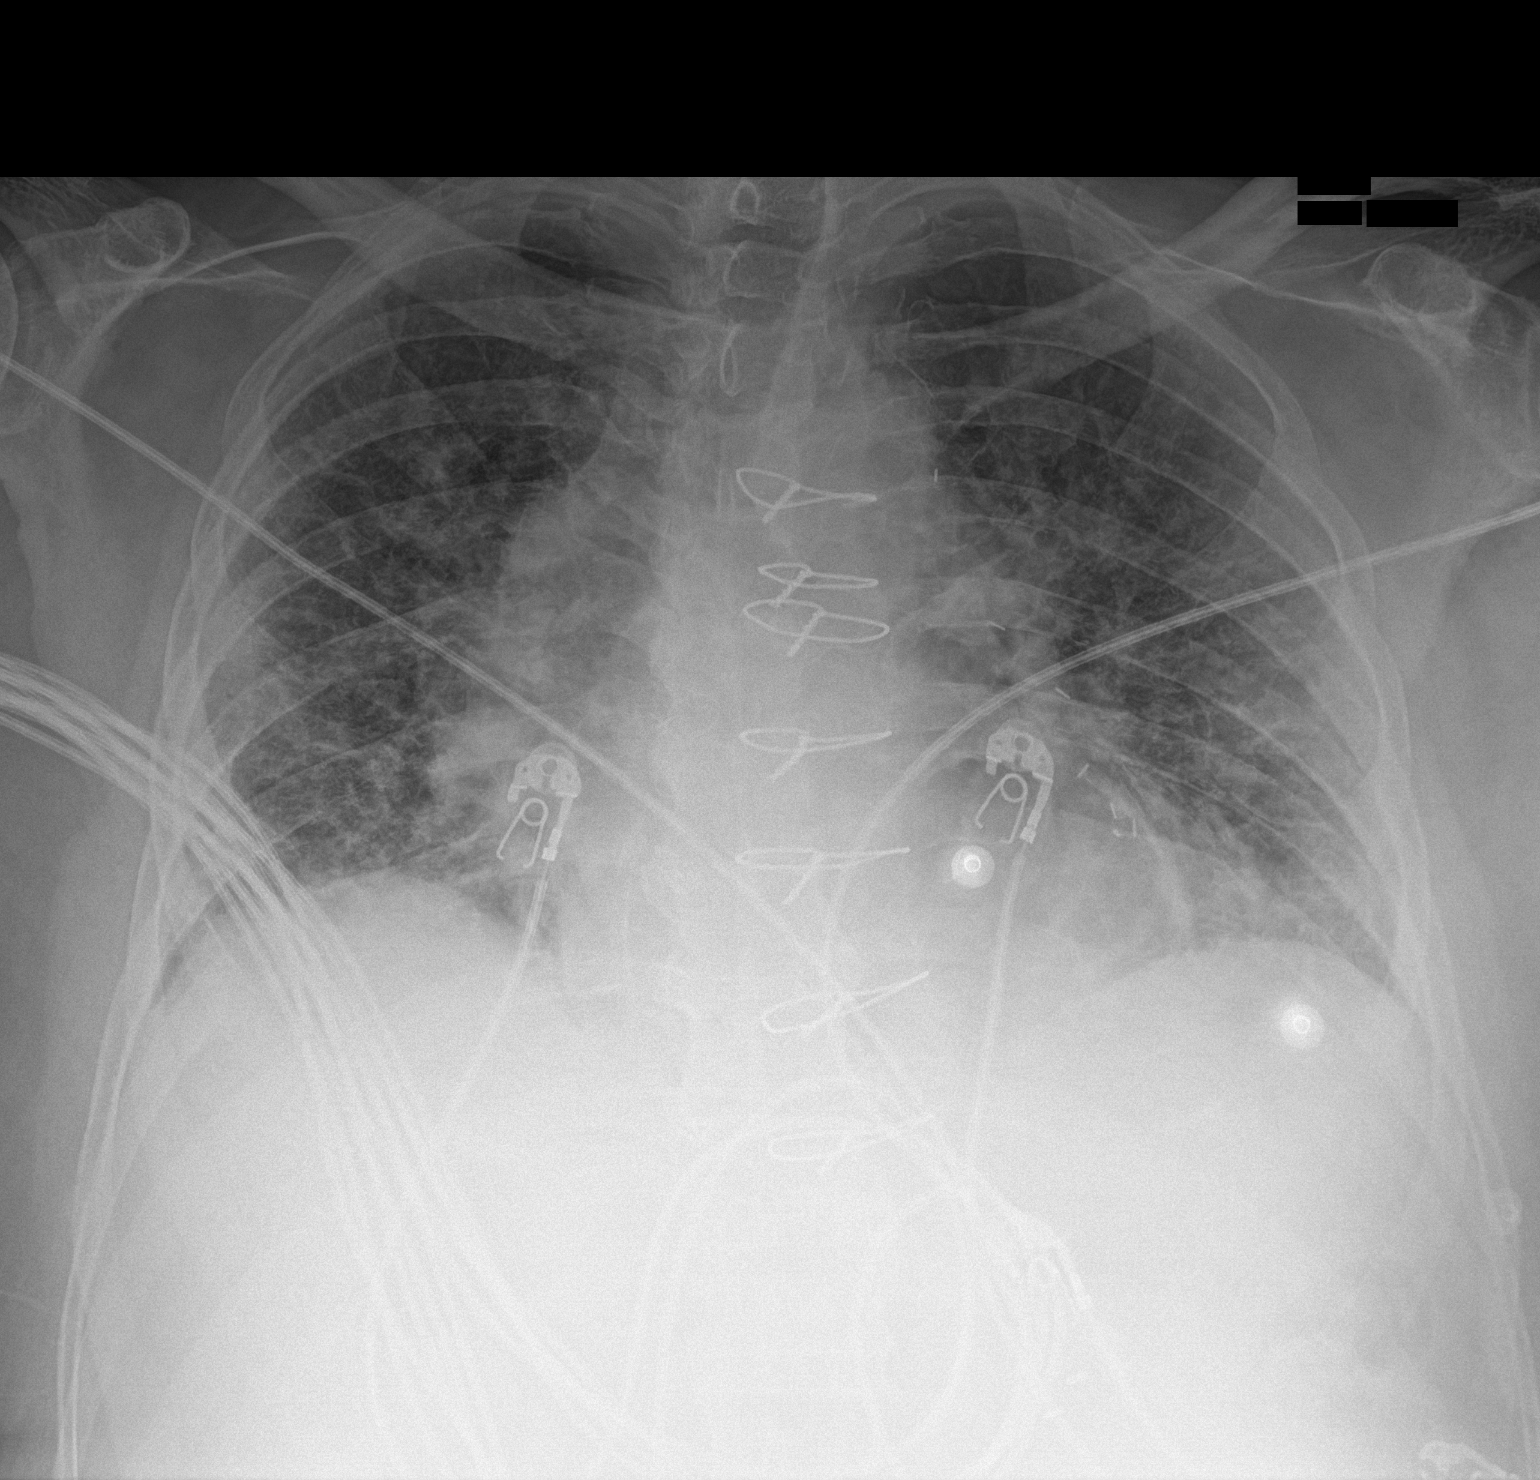

[1 of 1 positions shown; findings below may reference images not displayed]

FINDINGS: Stable cardiomegaly. Sternotomy wires are noted. Mildly increased
bilateral interstitial densities are noted concerning for pulmonary
edema. Bony thorax is unremarkable.
IMPRESSION: Mildly increased bilateral interstitial densities are noted
concerning for possible pulmonary edema.

## 2023-04-20 ENCOUNTER — Telehealth: Payer: Self-pay

## 2023-04-20 ENCOUNTER — Ambulatory Visit: Payer: Medicare HMO | Attending: Cardiology | Admitting: Cardiology

## 2023-04-20 ENCOUNTER — Encounter: Payer: Self-pay | Admitting: Cardiology

## 2023-04-20 VITALS — BP 110/64 | HR 80 | Ht 68.0 in | Wt 225.8 lb

## 2023-04-20 DIAGNOSIS — E66811 Obesity, class 1: Secondary | ICD-10-CM | POA: Diagnosis not present

## 2023-04-20 DIAGNOSIS — E1122 Type 2 diabetes mellitus with diabetic chronic kidney disease: Secondary | ICD-10-CM

## 2023-04-20 DIAGNOSIS — E782 Mixed hyperlipidemia: Secondary | ICD-10-CM | POA: Diagnosis not present

## 2023-04-20 DIAGNOSIS — N1832 Chronic kidney disease, stage 3b: Secondary | ICD-10-CM

## 2023-04-20 DIAGNOSIS — R0609 Other forms of dyspnea: Secondary | ICD-10-CM | POA: Diagnosis not present

## 2023-04-20 DIAGNOSIS — I5032 Chronic diastolic (congestive) heart failure: Secondary | ICD-10-CM

## 2023-04-20 DIAGNOSIS — I6523 Occlusion and stenosis of bilateral carotid arteries: Secondary | ICD-10-CM | POA: Diagnosis not present

## 2023-04-20 DIAGNOSIS — I25118 Atherosclerotic heart disease of native coronary artery with other forms of angina pectoris: Secondary | ICD-10-CM

## 2023-04-20 MED ORDER — FUROSEMIDE 20 MG PO TABS: 20.0000 mg | ORAL_TABLET | Freq: Every day | ORAL | 3 refills | Status: DC

## 2023-04-20 NOTE — Telephone Encounter (Signed)
Pt called and states that his Cardiologist is recommending him to restart his Lasix 20 mg after visit today. It looks like the prescription was sent in today by Charlsie Quest, NP.   Per Cardiologist visit notes:  Medication Instructions:  Your physician has recommended you make the following change in your medication:   START: furosemide (LASIX) 20 MG tablet - Take 1 tablet (20 mg total) by mouth daily   *If you need a refill on your cardiac medications before your next appointment, please call your pharmacy*

## 2023-04-20 NOTE — Patient Instructions (Signed)
Medication Instructions:  Your physician has recommended you make the following change in your medication:  START: furosemide (LASIX) 20 MG tablet - Take 1 tablet (20 mg total) by mouth daily  *If you need a refill on your cardiac medications before your next appointment, please call your pharmacy*  Lab Work: Your provider would like for you to have following labs drawn today BMET & CBC.   If you have labs (blood work) drawn today and your tests are completely normal, you will receive your results only by: MyChart Message (if you have MyChart) OR A paper copy in the mail If you have any lab test that is abnormal or we need to change your treatment, we will call you to review the results.  Testing/Procedures: Your physician has requested that you have an echocardiogram. Echocardiography is a painless test that uses sound waves to create images of your heart. It provides your doctor with information about the size and shape of your heart and how well your heart's chambers and valves are working. This procedure takes approximately one hour. There are no restrictions for this procedure. Please do NOT wear cologne, perfume, aftershave, or lotions (deodorant is allowed). Please arrive 15 minutes prior to your appointment time.  Please note: We ask at that you not bring children with you during ultrasound (echo/ vascular) testing. Due to room size and safety concerns, children are not allowed in the ultrasound rooms during exams. Our front office staff cannot provide observation of children in our lobby area while testing is being conducted. An adult accompanying a patient to their appointment will only be allowed in the ultrasound room at the discretion of the ultrasound technician under special circumstances. We apologize for any inconvenience.   Follow-Up: At [facility], you and your health needs are our priority.  As part of our continuing mission to provide you with exceptional heart  care, we have created designated Provider Care Teams.  These Care Teams include your primary Cardiologist (physician) and Advanced Practice Providers (APPs -  Physician Assistants and Nurse Practitioners) who all work together to provide you with the care you need, when you need it.  Your next appointment:   After echocardiogram   Provider:   Charlsie Quest, NP    Other Instructions - None

## 2023-04-20 NOTE — Progress Notes (Unsigned)
Cardiology Office Note:  .   Date:  04/20/2023  ID:  [name]., DOB 01/06/1941, MRN [id number] PCP: Donita Brooks, MD  [facility] Providers Cardiologist:  None { Click to update primary MD,subspecialty MD or APP then REFRESH:1}   History of Present Illness: Chris [name]. is a 82 y.o. male with a past medical history of hypertension, hyperlipidemia, diabetes, history of stroke with no residual deficits, carotid artery stenosis, IPF, CAD status post CABG with multiple subsequent PCI's, who returns today to follow-up on his coronary artery disease.   Coronary artery disease with history of MI with CABG (1995) x3 vessels with LIMA to the LAD, SVG to RCA, SVG to OM, status post multiple coronary interventions.cardiac catheterization in 2000 with stent was placed x 3.  Repeat left heart catheterization in 01/2014 with grafts without changes.  He can underwent PCI to high-grade stenosis of the SVG to RCA in March 2022 and again in April 2023 needing aspiration thrombectomy followed by repeat stenting to the SVG to RCA who presented with an NSTEMI, EF had normalized from 35-40 to normal in May 2023.  Carotid duplex completed 09/2021 revealed no significant change from the 2021 study with follow-up recommended in 6 months.  He showed right ICA 50 to 69% and left ICA 1-15%.  The study was completed in 04/24/2022 which revealed right ICA of 60-49%, and left ICA 60-49%, recommended repeat study yearly.   He was last seen in clinic 05/01/2022 by Dr. Jacinto Halim.  At that time he was doing well with no recurrence of angina.  Continued to have chronic dyspnea.  He was continued on long-term Plavix.  He was started on Ozempic 0.25 mg will uptitrate to maximum dose as tolerated.  No further medication changes were made and no further testing was ordered.  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  Ao 118/62, mean 83 mmHg.  No pressure gradient across the aortic valve. LM: Large vessel, mild disease. LAD: Occluded in the midsegment.  Distal segment supplied by LIMA to LAD which is widely patent. LCx: Occluded in the proximal to mid segment.  OM1 is supplied by SVG to OM which is widely patent. RCA: Occluded in the midsegment just proximal to previously placed stent.  SVG to RCA is patent, previously placed 3.5 x 38 mm resolute Onyx DES is widely patent in the distal RCA, proximal to mid 38 mm Taxus DES placed in May 2010 has a thrombotic 80% stenosis, focal.  There is mild luminal loss throughout the stent.   Intervention data: Successful filter wire protected aspiration thrombectomy followed by stenting of the proximal segment of the SVG to RCA with implantation of a 4.0 x 12 mm resolute frontier DES at 18 atmospheric pressure, post stenosis 0%.  TIMI-3 to TIMI-3  flow maintained.   Echocardiogram 10/15/2021:  Normal LV systolic function with visual EF 55-60%. Left ventricle cavity  is normal in size. Normal left ventricular wall thickness. Normal global  wall motion. Abnormal septal wall motion due to post-operative septum.  Normal diastolic filling pattern, normal LAP.  Pericardium is normal. Insignificant pericardial effusion. There is no hemodynamic significance.  Compared to 11/06/2020  LVEF 35-40% and RWMA have now improved, otherwise no significant change.    Carotid artery duplex 04/24/2022: Duplex suggests stenosis in the right internal carotid artery (16-49%). Duplex suggests stenosis in the left internal carotid artery (16-49%). Compared to the study done on 10/15/2021, right ICA stenosis of >50% normal appears to have regressed. Follow up in one year is appropriate if clinically indicated. Risk Assessment/Calculations:             Physical Exam:   VS:  BP 110/64 (BP Location: Right Arm, Patient Position: Sitting, Cuff Size: Normal)   Pulse 80   Ht 5\' 8"  (1.727 m)   Wt 225 lb 12.8 oz (102.4 kg)   BMI 34.33 kg/m    Wt Readings from Last 3 Encounters:  04/20/23 225 lb 12.8 oz (102.4 kg)  03/03/23 224 lb (101.6 kg)  01/28/23 223 lb (101.2 kg)    GEN: Well nourished, well developed in no acute distress NECK: No JVD; No carotid bruits CARDIAC: RRR, + murmurs, rubs, gallops RESPIRATORY:  Clear to auscultation without rales, wheezing or rhonchi  ABDOMEN: Soft, non-tender, non-distended EXTREMITIES:  No edema; No deformity   ASSESSMENT AND PLAN: .   ***    {Are you ordering a CV Procedure (e.g. stress test, cath, DCCV, TEE, etc)?   Press F2        :[id number]}  Dispo: ***  Signed, Brezlyn Manrique, NP

## 2023-04-21 ENCOUNTER — Other Ambulatory Visit: Payer: Self-pay

## 2023-04-21 LAB — CBC
Hematocrit: 36.1 % — ABNORMAL LOW (ref 37.5–51.0)
Hemoglobin: 10.5 g/dL — ABNORMAL LOW (ref 13.0–17.7)
MCH: 20.6 pg — ABNORMAL LOW (ref 26.6–33.0)
MCHC: 29.1 g/dL — ABNORMAL LOW (ref 31.5–35.7)
MCV: 71 fL — ABNORMAL LOW (ref 79–97)
Platelets: 342 10*3/uL (ref 150–450)
RBC: 5.1 x10E6/uL (ref 4.14–5.80)
RDW: 19.1 % — ABNORMAL HIGH (ref 11.6–15.4)
WBC: 9.3 10*3/uL (ref 3.4–10.8)

## 2023-04-21 LAB — BASIC METABOLIC PANEL
BUN/Creatinine Ratio: 10 (ref 10–24)
BUN: 13 mg/dL (ref 8–27)
CO2: 22 mmol/L (ref 20–29)
Calcium: 9.6 mg/dL (ref 8.6–10.2)
Chloride: 105 mmol/L (ref 96–106)
Creatinine, Ser: 1.27 mg/dL (ref 0.76–1.27)
Glucose: 80 mg/dL (ref 70–99)
Potassium: 4.3 mmol/L (ref 3.5–5.2)
Sodium: 143 mmol/L (ref 134–144)
eGFR: 57 mL/min/{1.73_m2} — ABNORMAL LOW (ref >59)

## 2023-04-21 MED ORDER — FUROSEMIDE 20 MG PO TABS: 20.0000 mg | ORAL_TABLET | Freq: Every day | ORAL | 0 refills | Status: DC

## 2023-04-21 NOTE — Telephone Encounter (Signed)
Requested Prescriptions  Pending Prescriptions Disp Refills   furosemide (LASIX) 20 MG tablet 30 tablet 0    Sig: Take 1 tablet (20 mg total) by mouth daily.     Cardiovascular:  Diuretics - Loop Failed - 04/21/2023  4:49 PM      Failed - Mg Level in normal range and within 180 days    Magnesium  Date Value Ref Range Status  05/07/2021 2.2 1.7 - 2.4 mg/dL Final    Comment:    Performed at [facility], [address] [place], [place] [address]         Failed - Valid encounter within last 6 months    Recent Outpatient Visits           1 year ago ASCVD (arteriosclerotic cardiovascular disease)   [facility] Donita Brooks, MD   1 year ago Hiatal hernia   [facility] Tanya Nones, [name], MD   2 years ago Allergy injection reaction, initial encounter   [facility] Donita Brooks, MD   2 years ago Left ureteral stone   [facility] Donita Brooks, MD   2 years ago Interstitial pulmonary disease (HCC)   [facility] [name], [name], MD       Future Appointments             In 1 month Charlsie Quest, NP  [facility] - K in normal range and within 180 days    Potassium  Date Value Ref Range Status  04/20/2023 4.3 3.5 - 5.2 mmol/L Final         Passed - Ca in normal range and within 180 days    Calcium  Date Value Ref Range Status  04/20/2023 9.6 8.6 - 10.2 mg/dL Final   Calcium, Ion  Date Value Ref Range Status  09/04/2021 1.08 (L) 1.15 - 1.40 mmol/L Final         Passed - Na in normal range and within 180 days    Sodium  Date Value Ref Range Status  04/20/2023 143 134 - 144 mmol/L Final         Passed - Cr in normal range and within 180 days    Creat  Date Value Ref Range Status  12/25/2022 1.49 (H) 0.70 - 1.28 mg/dL Final   Creatinine, Ser  Date Value Ref Range Status  04/20/2023 1.27 0.76 - 1.27 mg/dL Final    Creatinine, Urine  Date Value Ref Range Status  04/06/2022 CANCELED      Comment:    TEST NOT PERFORMED . No urine received.  Result canceled by the ancillary.          Passed - Cl in normal range and within 180 days    Chloride  Date Value Ref Range Status  04/20/2023 105 96 - 106 mmol/L Final         Passed - Last BP in normal range    BP Readings from Last 1 Encounters:  04/20/23 110/64

## 2023-04-21 NOTE — Progress Notes (Signed)
Blood counts remain stable. Slightly anemic. Kidney function and electrolytes are stable. Nothing to cause current symptoms of SOB.

## 2023-04-21 NOTE — Telephone Encounter (Signed)
Copied from CRM [id number]. Topic: Clinical - Prescription Issue >> Apr 21, 2023  3:47 PM Chris [name] wrote: Reason for CRM:  furosemide (LASIX) 20 MG tablet I advised patient prescription had been sent into [facility] on yesterday for his mail order supply  Patient stated hes very concerned he was instructed by his cardiologist to start right back up on it he is swelling and wanted to see if at least a months worth could be sent to the [facility] in [place] on [address].

## 2023-04-28 ENCOUNTER — Ambulatory Visit: Payer: Medicare HMO | Admitting: Cardiology

## 2023-05-07 ENCOUNTER — Telehealth: Payer: Self-pay

## 2023-05-07 ENCOUNTER — Other Ambulatory Visit: Payer: Self-pay

## 2023-05-07 DIAGNOSIS — E1159 Type 2 diabetes mellitus with other circulatory complications: Secondary | ICD-10-CM

## 2023-05-07 MED ORDER — EMPAGLIFLOZIN 25 MG PO TABS
25.0000 mg | ORAL_TABLET | Freq: Every day | ORAL | 0 refills | Status: DC
Start: 2023-05-07 — End: 2023-06-29

## 2023-05-07 NOTE — Telephone Encounter (Signed)
Copied from CRM [id number]. Topic: General - Other >> May 07, 2023 12:06 PM Chris [name] wrote: Reason for CRM: pt called in and wants to speak to provider about appointment he had with cardiologist he has some questions

## 2023-05-11 ENCOUNTER — Ambulatory Visit: Payer: Medicare HMO | Attending: Cardiology

## 2023-05-11 DIAGNOSIS — R0609 Other forms of dyspnea: Secondary | ICD-10-CM

## 2023-05-11 LAB — ECHOCARDIOGRAM COMPLETE
Area-P 1/2: 4.06 cm2
S' Lateral: 3.9 cm

## 2023-05-12 ENCOUNTER — Other Ambulatory Visit: Payer: Self-pay | Admitting: Family Medicine

## 2023-05-13 NOTE — Progress Notes (Signed)
Can we request images of his prior studies completed by Dr. Verl Dicker office for Dr. Mariah Milling to compare studies.

## 2023-05-24 ENCOUNTER — Other Ambulatory Visit: Payer: Self-pay | Admitting: Family Medicine

## 2023-05-24 DIAGNOSIS — K469 Unspecified abdominal hernia without obstruction or gangrene: Secondary | ICD-10-CM

## 2023-05-27 ENCOUNTER — Ambulatory Visit: Payer: Medicare HMO | Admitting: Cardiology

## 2023-05-27 NOTE — Progress Notes (Deleted)
 Cardiology Office Note:  .   Date:  05/27/2023  ID:  Chris [name]., DOB 1940-08-08, MRN [id number] PCP: [name], MD  [facility] Providers Cardiologist:  None { Click to update primary MD,subspecialty MD or APP then REFRESH:1}   History of Present Illness: Chris [name]. is a 83 y.o. male with a past medical history of hypertension, hyperlipidemia, diabetes, history of stroke with no residual defects, carotid artery stenosis, IPF, CAD status post CABG with multiple subsequent PCI's, who returns today for follow-up.   Coronary artery disease with history of MI with CABG (1995) x3 vessels with LIMA to the LAD, SVG to RCA, SVG to OM, status post multiple coronary interventions.cardiac catheterization in 2000 with stent was placed x 3. Repeat left heart catheterization in 01/2014 with grafts without changes. He can underwent PCI to high-grade stenosis of the SVG to RCA in March 2022 and again in April 2023 needing aspiration thrombectomy followed by repeat stenting to the SVG to RCA who presented with an NSTEMI, EF had normalized from 35-40 to normal in May 2023. Carotid duplex completed 09/2021 revealed no significant change from the 2021 study with follow-up recommended in 6 months. He showed right ICA 50 to 69% and left ICA 1-15%. The study was completed in 04/24/2022 which revealed right ICA of 60-49%, and left ICA 60-49%, recommended repeat study yearly.    He was last seen in clinic 04/20/2023.  At that time he had continued complaints of shortness of breath of the last several months.  He had noted to be out of his furosemide .  He was under an increased amount of stress and due to close friend who died earlier in the day.  He was scheduled for an updated echocardiogram.  He returns to clinic today  ROS: 10 point review of systems were reviewed and considered negative with exception was been listed in the HPI  Studies Reviewed: .       2D echo 05/11/2023 1. Left  ventricular ejection fraction, by estimation, is 35 to 40%. Left  ventricular ejection fraction by PLAX is 47 %. The left ventricle has  moderately decreased function. The left ventricle demonstrates regional  wall motion abnormalities (mild global   hypokinesis with severe inferior wall hypokinesis). Left ventricular  diastolic parameters are consistent with Grade I diastolic dysfunction  (impaired relaxation).   2. Right ventricular systolic function is mild to moderately reduced. The  right ventricular size is mildly enlarged. Tricuspid regurgitation signal  is inadequate for assessing PA pressure.   3. The mitral valve is normal in structure. Mild mitral valve  regurgitation. No evidence of mitral stenosis.   4. The aortic valve is normal in structure. Aortic valve regurgitation is  not visualized. No aortic stenosis is present.   5. The inferior vena cava is normal in size with greater than 50%  respiratory variability, suggesting right atrial pressure of 3 mmHg.   Left Heart Catheterization 09/04/21:  LV: 121/10, EDP 26 mmHg.  Ao 118/62, mean 83 mmHg.  No pressure gradient across the aortic valve. LM: Large vessel, mild disease. LAD: Occluded in the midsegment.  Distal segment supplied by LIMA to LAD which is widely patent. LCx: Occluded in the proximal to mid segment.  OM1 is supplied by SVG to OM which is widely patent. RCA: Occluded in the midsegment just proximal to previously placed stent.  SVG to RCA is patent, previously placed 3.5 x 38 mm resolute Onyx DES  is widely patent in the distal RCA, proximal to mid 38 mm Taxus DES placed in May 2010 has a thrombotic 80% stenosis, focal.  There is mild luminal loss throughout the stent.   Intervention data: Successful filter wire protected aspiration thrombectomy followed by stenting of the proximal segment of the SVG to RCA with implantation of a 4.0 x 12 mm resolute frontier DES at 18 atmospheric pressure, post stenosis 0%.  TIMI-3 to  TIMI-3  flow maintained.    Echocardiogram 10/15/2021:  Normal LV systolic function with visual EF 55-60%. Left ventricle cavity  is normal in size. Normal left ventricular wall thickness. Normal global  wall motion. Abnormal septal wall motion due to post-operative septum.  Normal diastolic filling pattern, normal LAP.  Pericardium is normal. Insignificant pericardial effusion. There is no hemodynamic significance.  Compared to 11/06/2020 LVEF 35-40% and RWMA have now improved, otherwise no significant change.    Carotid artery duplex 04/24/2022: Duplex suggests stenosis in the right internal carotid artery (16-49%). Duplex suggests stenosis in the left internal carotid artery (16-49%). Compared to the study done on 10/15/2021, right ICA stenosis of >50% normal appears to have regressed. Follow up in one year is appropriate if clinically indicated. Risk Assessment/Calculations:     No BP recorded.  {Refresh Note OR Click here to enter BP  :1}***       Physical Exam:   VS:  There were no vitals taken for this visit.   Wt Readings from Last 3 Encounters:  04/20/23 225 lb 12.8 oz (102.4 kg)  03/03/23 224 lb (101.6 kg)  01/28/23 223 lb (101.2 kg)    GEN: Well nourished, well developed in no acute distress NECK: No JVD; No carotid bruits CARDIAC: ***RRR, no murmurs, rubs, gallops RESPIRATORY:  Clear to auscultation without rales, wheezing or rhonchi  ABDOMEN: Soft, non-tender, non-distended EXTREMITIES:  No edema; No deformity   ASSESSMENT AND PLAN: .   ***    {Are you ordering a CV Procedure (e.g. stress test, cath, DCCV, TEE, etc)?   Press F2        :[id number]}  Dispo: ***  Signed, Arria Naim, NP

## 2023-05-31 ENCOUNTER — Other Ambulatory Visit: Payer: Self-pay

## 2023-05-31 ENCOUNTER — Ambulatory Visit: Payer: Self-pay | Admitting: Family Medicine

## 2023-05-31 DIAGNOSIS — K469 Unspecified abdominal hernia without obstruction or gangrene: Secondary | ICD-10-CM

## 2023-05-31 MED ORDER — PANTOPRAZOLE SODIUM 40 MG PO TBEC
40.0000 mg | DELAYED_RELEASE_TABLET | Freq: Every day | ORAL | 1 refills | Status: DC
Start: 2023-05-31 — End: 2023-09-03

## 2023-05-31 NOTE — Telephone Encounter (Signed)
 Copied from CRM [id number]. Topic: Clinical - Medical Advice >> May 31, 2023  1:08 PM Antonio H wrote: Reason for CRM: Patient is currently in pain, needing a refill on a medication for his stomach ulcers but not sure of the name of it, says it is shaped like a football, currently in the hospital with his wife and the added stress is making him hurt even worse.   Chief Complaint: stomach pain due to ulcer Symptoms: declined triage Frequency: ongoing as he is out of medication  Pertinent Negatives: Declined triage Disposition: [] ED /[] Urgent Care (no appt availability in office) / [] Appointment(In office/virtual)/ []  [place] [facility]/ [] Home Care/ [] Refused Recommended Disposition /[] [facility]/ [x]  Follow-up with PCP Additional Notes: The patient was tearful and frustrated during the call as he is in the hospital with his wife who is very sick.  He declined being triaged as he was in a rush and just wanted the prescription to be followed up on. He spoke to the mail delivery pharmacy last week and they stated they did not have a prescription for protonix  and they stated they would follow up with the doctor's office. Per epic prescription sent 05/25/23 for 90 days and 3 refills.  It was written for one daily but per office visit note, he was advised to take it twice daily.  He still has not received it and is in pain as he ran out of his remaining prescription because his dose was doubled.  He also reported that his metformin  was discontinued due to the ulcer and the medication it was replaced, Januvia , was never sent.  Doctors walked into the room and he had to end the call. Reason for Disposition  [1] Prescription not at pharmacy AND [2] was prescribed by PCP recently  (Exception: [facility] has access to EMR and prescription is recorded there. Go to Home Care and confirm for pharmacy.)  Answer Assessment - Initial Assessment Questions 1. DRUG NAME: What medicine do you need to have  refilled?     Protonix   2. REFILLS REMAINING: How many refills are remaining? (Note: The label on the medicine or pill bottle will show how many refills are remaining. If there are no refills remaining, then a renewal may be needed.)     Per epic prescription sent 05/25/23 for 90 days and 3 refills.  It was written for one daily but per note, he was advised to take it twice daily but per patient the pharmacy never received the prescription.  4. PRESCRIBING HCP: Who prescribed it? Reason: If prescribed by specialist, call should be referred to that group.     Dr. Duanne 5. SYMPTOMS: Do you have any symptoms?     Stomach pain  Protocols used: Medication Refill and Renewal Call-A-AH

## 2023-06-09 ENCOUNTER — Other Ambulatory Visit: Payer: Self-pay | Admitting: Cardiology

## 2023-06-09 DIAGNOSIS — I6521 Occlusion and stenosis of right carotid artery: Secondary | ICD-10-CM

## 2023-06-09 DIAGNOSIS — I214 Non-ST elevation (NSTEMI) myocardial infarction: Secondary | ICD-10-CM

## 2023-06-09 DIAGNOSIS — I25110 Atherosclerotic heart disease of native coronary artery with unstable angina pectoris: Secondary | ICD-10-CM

## 2023-06-09 NOTE — Telephone Encounter (Signed)
 This is a Educational psychologist pt

## 2023-06-21 ENCOUNTER — Ambulatory Visit: Payer: Self-pay | Admitting: Family Medicine

## 2023-06-21 NOTE — Telephone Encounter (Signed)
  Chief Complaint: Insomnia Symptoms: Unable to sleep Frequency: Ongoing for about a week, wife passed Pertinent Negatives: Patient denies SI/HI, CP, SOB Disposition: [] ED /[] Urgent Care (no appt availability in office) / [x] Appointment(In office/virtual)/ []  [place] [facility]/ [] Home Care/ [] Refused Recommended Disposition /[] [place] [facility]/ []  Follow-up with PCP Additional Notes: Pt reports his wife passed away last week and he has been unable to sleep. He reports some nights he will lie awake until 0400-0500, he has tried Tylenol and Nyquil. He reports the Nyquil helped some but he does not want to continue taking that and would like to discuss a gentle sleep aid with his provider. OV scheduled for tomorrow. This RN educated pt on home care, new-worsening symptoms, when to call back/seek emergent care. Pt verbalized understanding and agrees to plan.    Copied from CRM [id number]. Topic: Clinical - Red Word Triage >> Jun 21, 2023  1:10 PM Suzette B wrote: [facility] that prompted transfer to Nurse Triage: patient has lost his wife last week and he states he's at his wits end and cannot sleep Reason for Disposition  [1] Depression AND [2] worsening (e.g., sleeping poorly, less able to do activities of daily living)  Answer Assessment - Initial Assessment Questions 1. CONCERN: "What happened that made you call today?"     Pt lost his wife last week, unable to sleep 2. DEPRESSION SYMPTOM SCREENING: "How are you feeling overall?" (e.g., decreased energy, increased sleeping or difficulty sleeping, difficulty concentrating, feelings of sadness, guilt, hopelessness, or worthlessness)     Difficulty sleeping, sadness 3. RISK OF HARM - SUICIDAL IDEATION:  "Do you ever have thoughts of hurting or killing yourself?"  (e.g., yes, no, no but preoccupation with thoughts about death)   - INTENT:  "Do you have thoughts of hurting or killing yourself right NOW?" (e.g., yes, no, N/A)   - PLAN: "Do  you have a specific plan for how you would do this?" (e.g., gun, knife, overdose, no plan, N/A)     None 4. RISK OF HARM - HOMICIDAL IDEATION:  "Do you ever have thoughts of hurting or killing someone else?"  (e.g., yes, no, no but preoccupation with thoughts about death)   - INTENT:  "Do you have thoughts of hurting or killing someone right NOW?" (e.g., yes, no, N/A)   - PLAN: "Do you have a specific plan for how you would do this?" (e.g., gun, knife, no plan, N/A)      None 8. STRESSORS: "Has there been any new stress or recent changes in your life?"     Loss of wife last week  Protocols used: Depression-A-AH

## 2023-06-22 ENCOUNTER — Ambulatory Visit (INDEPENDENT_AMBULATORY_CARE_PROVIDER_SITE_OTHER): Payer: Medicare HMO | Admitting: Family Medicine

## 2023-06-22 ENCOUNTER — Encounter: Payer: Self-pay | Admitting: Family Medicine

## 2023-06-22 VITALS — BP 132/70 | HR 76 | Temp 97.6°F | Ht 68.0 in | Wt 220.0 lb

## 2023-06-22 DIAGNOSIS — G4700 Insomnia, unspecified: Secondary | ICD-10-CM | POA: Insufficient documentation

## 2023-06-22 MED ORDER — TRAZODONE HCL 50 MG PO TABS: 25.0000 mg | ORAL_TABLET | Freq: Every evening | ORAL | 0 refills | Status: DC | PRN

## 2023-06-22 NOTE — Assessment & Plan Note (Signed)
Symptoms consistent with sleep onset insomnia due to stressors of wifes recent passing. He would not like to see a therapist at this time. Would like a medication to help him fall asleep, has tried Benadryl. Will start Trazodone 25mg  nightly PRN. Encourage good sleep hygiene practices. Follow up with PCP in 4 weeks or sooner if needed.

## 2023-06-22 NOTE — Progress Notes (Signed)
Subjective:  HPI: Chris [name]. is a 83 y.o. male presenting on 06/22/2023 for Acute Visit (Insomnia, lost wife last week//[id number]//[employer]//[id number])   HPI Patient is in today for difficulty falling asleep due to his mind racing since his wifes recent passing. When he falls asleep, he wakes up at 3am. This is new for him. He denies daytime symptoms of depression or anxiety. He has not spoken to a therapist and would not like to at this time. Denies SI/HI. He does have a good support system with his children.    Review of Systems  All other systems reviewed and are negative.   Relevant past medical history reviewed and updated as indicated.   Past Medical History:  Diagnosis Date   Abnormal exercise myocardial perfusion study    03/2018- 38% ef, see report   Arthritis    Chronic kidney disease    Congestive heart failure (CHF) (HCC) 09/2015   Coronary artery disease    DOE (dyspnea on exertion)    ED (erectile dysfunction)    GERD (gastroesophageal reflux disease)    occ   Gout    H/O hiatal hernia    Hyperlipidemia    Hypertension    Myocardial infarction (HCC)    Neuropathy    toes   Obesity    RBBB (right bundle branch block)    Stenosis of right internal carotid artery    Type II diabetes mellitus (HCC)    Umbilical hernia    a. s/p repair.   Vertigo      Past Surgical History:  Procedure Laterality Date   ANKLE ARTHROSCOPY WITH DRILLING/MICROFRACTURE Left 04/13/2013   Procedure: LEFT ANKLE ARTHROSCOPY WITH EXTENSIVE DEBRIEDMENT;  Surgeon: Toni Arthurs, MD;  Location:  SURGERY CENTER;  Service: Orthopedics;  Laterality: Left;   ANKLE SURGERY Left 08/18/2013   DR HEWITT   CARDIAC CATHETERIZATION  2000   stents x3   CARDIAC CATHETERIZATION  02/15/2014   Procedure: LEFT HEART CATH AND CORS/GRAFTS ANGIOGRAPHY;  Surgeon: Pamella Pert, MD;  Location: [facility] CATH LAB;  Service: Cardiovascular;;   COLONOSCOPY     CORONARY ARTERY BYPASS GRAFT   1995   LIMA to LAD, SVG to RCA, SVG to OM.    CORONARY STENT INTERVENTION N/A 08/12/2020   Procedure: CORONARY STENT INTERVENTION;  Surgeon: Elder Negus, MD;  Location: [facility] INVASIVE CV LAB;  Service: Cardiovascular;  Laterality: N/A;   CORONARY STENT INTERVENTION N/A 09/04/2021   Procedure: CORONARY STENT INTERVENTION;  Surgeon: Yates Decamp, MD;  Location: [facility] INVASIVE CV LAB;  Service: Cardiovascular;  Laterality: N/A;   LEFT HEART CATH AND CORS/GRAFTS ANGIOGRAPHY N/A 08/12/2020   Procedure: LEFT HEART CATH AND CORS/GRAFTS ANGIOGRAPHY;  Surgeon: Elder Negus, MD;  Location: [facility] INVAS[facility] CV LAB;  Service: Cardiovascular;  Laterality: N/A;   LEFT HEART CATH AND CORS/GRAFTS ANGIOGRAPHY N/A 09/04/2021   Procedure: LEFT HEART CATH AND CORS/GRAFTS ANGIOGRAPHY;  Surgeon: Yates Decamp, MD;  Location: [facility] CV LAB;  Service: Cardiovascular;  Laterality: N/A;   TOTAL ANKLE ARTHROPLASTY Left 08/17/2013   Procedure: LEFT TOTAL ANKLE REPLACEMENT WITH POSSIBLE GASTROC RECESSION ;  Surgeon: Toni Arthurs, MD;  Location: [facility] OR;  Service: Orthopedics;  Laterality: Left;   UMBILICAL HERNIA REPAIR  12/2007    Allergies and medications reviewed and updated.   Current Outpatient Medications:    acetaminophen (TYLENOL) 500 MG tablet, Take 500 mg by mouth every 6 (six) hours as needed for mild pain., Disp: , Rfl:  aspirin EC 81 MG tablet, Take 1 tablet (81 mg total) by mouth daily., Disp: , Rfl:    Blood Glucose Monitoring Suppl DEVI, 1 each by Does not apply route in the morning, at noon, and at bedtime. May substitute to any manufacturer covered by patient's insurance., Disp: 1 each, Rfl: 0   clopidogrel (PLAVIX) 75 MG tablet, TAKE 1 TABLET EVERY DAY, Disp: 90 tablet, Rfl: 3   colchicine 0.6 MG tablet, Take 0.6 mg by mouth daily., Disp: , Rfl:    empagliflozin (JARDIANCE) 25 MG TABS tablet, Take 1 tablet (25 mg total) by mouth daily., Disp: 90 tablet, Rfl: 0   furosemide (LASIX) 20 MG tablet, Take 1  tablet (20 mg total) by mouth daily., Disp: 30 tablet, Rfl: 0   nitroGLYCERIN (NITROSTAT) 0.4 MG SL tablet, Place 1 tablet (0.4 mg total) under the tongue every 5 (five) minutes as needed for chest pain., Disp: 15 tablet, Rfl: 3   pantoprazole (PROTONIX) 40 MG tablet, Take 1 tablet (40 mg total) by mouth daily., Disp: 90 tablet, Rfl: 1   rosuvastatin (CRESTOR) 40 MG tablet, TAKE 1 TABLET EVERY DAY, Disp: 90 tablet, Rfl: 3   sitaGLIPtin (JANUVIA) 100 MG tablet, Take 1 tablet (100 mg total) by mouth daily., Disp: 30 tablet, Rfl: 5   sucralfate (CARAFATE) 1 g tablet, TAKE 1 TABLET BY MOUTH FOUR TIMES DAILY WITH MEALS AND AT BEDTIME, Disp: 360 tablet, Rfl: 0   traZODone (DESYREL) 50 MG tablet, Take 0.5 tablets (25 mg total) by mouth at bedtime as needed for sleep., Disp: 30 tablet, Rfl: 0  Allergies  Allergen Reactions   Brilinta [Ticagrelor] Shortness Of Breath   Alpha-Gal Hives   Crestor [Rosuvastatin Calcium] Other (See Comments)    Muscle pain- tolerating this 2022, however   Lipitor [Atorvastatin] Other (See Comments)    Intolerable muscle pain all over     Objective:   BP 132/70   Pulse 76   Temp 97.6 F (36.4 C) (Oral)   Ht 5\' 8"  (1.727 m)   Wt 220 lb (99.8 kg)   SpO2 96%   BMI 33.45 kg/m      06/22/2023   12:04 PM 04/20/2023    1:27 PM 03/03/2023   12:39 PM  Vitals with BMI  Height 5\' 8"  5\' 8"  5\' 8"   Weight 220 lbs 225 lbs 13 oz 224 lbs  BMI 33.46 34.34 34.07  Systolic 132 110 956  Diastolic 70 64 72  Pulse 76 80 88     Physical Exam Vitals and nursing note reviewed.  Constitutional:      Appearance: Normal appearance. He is normal weight.  HENT:     Head: Normocephalic and atraumatic.  Skin:    General: Skin is warm and dry.     Capillary Refill: Capillary refill takes less than 2 seconds.  Neurological:     General: No focal deficit present.     Mental Status: He is alert and oriented to person, place, and time. Mental status is at baseline.  Psychiatric:         Mood and Affect: Mood normal. Affect is tearful.        Behavior: Behavior normal.        Thought Content: Thought content normal.        Judgment: Judgment normal.     Assessment & Plan:  Insomnia, unspecified type Assessment & Plan: Symptoms consistent with sleep onset insomnia due to stressors of wifes recent passing. He would not like to  see a therapist at this time. Would like a medication to help him fall asleep, has tried Benadryl. Will start Trazodone 25mg  nightly PRN. Encourage good sleep hygiene practices. Follow up with PCP in 4 weeks or sooner if needed.   Other orders -     traZODone HCl; Take 0.5 tablets (25 mg total) by mouth at bedtime as needed for sleep.  Dispense: 30 tablet; Refill: 0     Follow up plan: Return in about 4 weeks (around 07/20/2023) for follow up with PCP, anxiety/depression.  Park Meo, FNP

## 2023-06-24 ENCOUNTER — Other Ambulatory Visit: Payer: Self-pay

## 2023-06-24 ENCOUNTER — Emergency Department: Payer: Medicare HMO

## 2023-06-24 ENCOUNTER — Observation Stay
Admission: EM | Admit: 2023-06-24 | Discharge: 2023-06-25 | Disposition: A | Discharge status: Home / Self Care | Payer: Medicare HMO | Attending: Student | Admitting: Student

## 2023-06-24 ENCOUNTER — Ambulatory Visit: Payer: Medicare HMO | Attending: Cardiology | Admitting: Cardiology

## 2023-06-24 ENCOUNTER — Encounter: Payer: Self-pay | Admitting: Cardiology

## 2023-06-24 VITALS — BP 107/66 | HR 87 | Ht 68.0 in | Wt 218.6 lb

## 2023-06-24 DIAGNOSIS — Z955 Presence of coronary angioplasty implant and graft: Secondary | ICD-10-CM | POA: Insufficient documentation

## 2023-06-24 DIAGNOSIS — E538 Deficiency of other specified B group vitamins: Secondary | ICD-10-CM | POA: Insufficient documentation

## 2023-06-24 DIAGNOSIS — J8410 Pulmonary fibrosis, unspecified: Secondary | ICD-10-CM | POA: Diagnosis present

## 2023-06-24 DIAGNOSIS — K440 Diaphragmatic hernia with obstruction, without gangrene: Secondary | ICD-10-CM | POA: Diagnosis not present

## 2023-06-24 DIAGNOSIS — R0609 Other forms of dyspnea: Secondary | ICD-10-CM

## 2023-06-24 DIAGNOSIS — R001 Bradycardia, unspecified: Secondary | ICD-10-CM | POA: Diagnosis not present

## 2023-06-24 DIAGNOSIS — K449 Diaphragmatic hernia without obstruction or gangrene: Secondary | ICD-10-CM

## 2023-06-24 DIAGNOSIS — R072 Precordial pain: Secondary | ICD-10-CM | POA: Insufficient documentation

## 2023-06-24 DIAGNOSIS — I5020 Unspecified systolic (congestive) heart failure: Secondary | ICD-10-CM

## 2023-06-24 DIAGNOSIS — I6523 Occlusion and stenosis of bilateral carotid arteries: Secondary | ICD-10-CM

## 2023-06-24 DIAGNOSIS — G4733 Obstructive sleep apnea (adult) (pediatric): Secondary | ICD-10-CM | POA: Diagnosis present

## 2023-06-24 DIAGNOSIS — Z01812 Encounter for preprocedural laboratory examination: Secondary | ICD-10-CM | POA: Diagnosis not present

## 2023-06-24 DIAGNOSIS — N1832 Chronic kidney disease, stage 3b: Secondary | ICD-10-CM

## 2023-06-24 DIAGNOSIS — Z7982 Long term (current) use of aspirin: Secondary | ICD-10-CM | POA: Insufficient documentation

## 2023-06-24 DIAGNOSIS — N400 Enlarged prostate without lower urinary tract symptoms: Secondary | ICD-10-CM | POA: Diagnosis not present

## 2023-06-24 DIAGNOSIS — Z951 Presence of aortocoronary bypass graft: Secondary | ICD-10-CM | POA: Diagnosis not present

## 2023-06-24 DIAGNOSIS — I25118 Atherosclerotic heart disease of native coronary artery with other forms of angina pectoris: Secondary | ICD-10-CM

## 2023-06-24 DIAGNOSIS — R109 Unspecified abdominal pain: Principal | ICD-10-CM

## 2023-06-24 DIAGNOSIS — Z7984 Long term (current) use of oral hypoglycemic drugs: Secondary | ICD-10-CM | POA: Diagnosis not present

## 2023-06-24 DIAGNOSIS — E1122 Type 2 diabetes mellitus with diabetic chronic kidney disease: Secondary | ICD-10-CM | POA: Diagnosis not present

## 2023-06-24 DIAGNOSIS — Z87891 Personal history of nicotine dependence: Secondary | ICD-10-CM | POA: Diagnosis not present

## 2023-06-24 DIAGNOSIS — R112 Nausea with vomiting, unspecified: Principal | ICD-10-CM | POA: Diagnosis present

## 2023-06-24 DIAGNOSIS — Z7902 Long term (current) use of antithrombotics/antiplatelets: Secondary | ICD-10-CM | POA: Diagnosis not present

## 2023-06-24 DIAGNOSIS — Z79899 Other long term (current) drug therapy: Secondary | ICD-10-CM | POA: Insufficient documentation

## 2023-06-24 DIAGNOSIS — I130 Hypertensive heart and chronic kidney disease with heart failure and stage 1 through stage 4 chronic kidney disease, or unspecified chronic kidney disease: Secondary | ICD-10-CM | POA: Insufficient documentation

## 2023-06-24 DIAGNOSIS — I5022 Chronic systolic (congestive) heart failure: Secondary | ICD-10-CM | POA: Insufficient documentation

## 2023-06-24 DIAGNOSIS — K5730 Diverticulosis of large intestine without perforation or abscess without bleeding: Secondary | ICD-10-CM | POA: Diagnosis not present

## 2023-06-24 DIAGNOSIS — I10 Essential (primary) hypertension: Secondary | ICD-10-CM | POA: Diagnosis not present

## 2023-06-24 DIAGNOSIS — I2510 Atherosclerotic heart disease of native coronary artery without angina pectoris: Secondary | ICD-10-CM | POA: Diagnosis not present

## 2023-06-24 DIAGNOSIS — N200 Calculus of kidney: Secondary | ICD-10-CM | POA: Diagnosis not present

## 2023-06-24 DIAGNOSIS — Z96662 Presence of left artificial ankle joint: Secondary | ICD-10-CM | POA: Insufficient documentation

## 2023-06-24 DIAGNOSIS — R079 Chest pain, unspecified: Secondary | ICD-10-CM | POA: Diagnosis present

## 2023-06-24 DIAGNOSIS — R1084 Generalized abdominal pain: Secondary | ICD-10-CM | POA: Diagnosis not present

## 2023-06-24 DIAGNOSIS — E782 Mixed hyperlipidemia: Secondary | ICD-10-CM | POA: Diagnosis not present

## 2023-06-24 DIAGNOSIS — E119 Type 2 diabetes mellitus without complications: Secondary | ICD-10-CM

## 2023-06-24 DIAGNOSIS — R918 Other nonspecific abnormal finding of lung field: Secondary | ICD-10-CM | POA: Diagnosis not present

## 2023-06-24 DIAGNOSIS — R1012 Left upper quadrant pain: Secondary | ICD-10-CM | POA: Diagnosis not present

## 2023-06-24 LAB — CBC
HCT: 36.4 % — ABNORMAL LOW (ref 39.0–52.0)
Hemoglobin: 10.6 g/dL — ABNORMAL LOW (ref 13.0–17.0)
MCH: 21 pg — ABNORMAL LOW (ref 26.0–34.0)
MCHC: 29.1 g/dL — ABNORMAL LOW (ref 30.0–36.0)
MCV: 72.2 fL — ABNORMAL LOW (ref 80.0–100.0)
Platelets: 359 10*3/uL (ref 150–400)
RBC: 5.04 MIL/uL (ref 4.22–5.81)
RDW: 19.3 % — ABNORMAL HIGH (ref 11.5–15.5)
WBC: 9.1 10*3/uL (ref 4.0–10.5)
nRBC: 0 % (ref 0.0–0.2)

## 2023-06-24 LAB — TROPONIN I (HIGH SENSITIVITY): Troponin I (High Sensitivity): 11 ng/L (ref <18)

## 2023-06-24 LAB — BASIC METABOLIC PANEL
Anion gap: 15 (ref 5–15)
BUN: 13 mg/dL (ref 8–23)
CO2: 22 mmol/L (ref 22–32)
Calcium: 9.2 mg/dL (ref 8.9–10.3)
Chloride: 103 mmol/L (ref 98–111)
Creatinine, Ser: 1.47 mg/dL — ABNORMAL HIGH (ref 0.61–1.24)
GFR, Estimated: 48 mL/min — ABNORMAL LOW (ref >60)
Glucose, Bld: 163 mg/dL — ABNORMAL HIGH (ref 70–99)
Potassium: 4.1 mmol/L (ref 3.5–5.1)
Sodium: 140 mmol/L (ref 135–145)

## 2023-06-24 LAB — HEPATIC FUNCTION PANEL
ALT: 11 U/L (ref 0–44)
AST: 32 U/L (ref 15–41)
Albumin: 3.6 g/dL (ref 3.5–5.0)
Alkaline Phosphatase: 44 U/L (ref 38–126)
Bilirubin, Direct: 0.2 mg/dL (ref 0.0–0.2)
Indirect Bilirubin: 0.4 mg/dL (ref 0.3–0.9)
Total Bilirubin: 0.6 mg/dL (ref 0.0–1.2)
Total Protein: 7.8 g/dL (ref 6.5–8.1)

## 2023-06-24 LAB — BRAIN NATRIURETIC PEPTIDE: B Natriuretic Peptide: 135.3 pg/mL — ABNORMAL HIGH (ref 0.0–100.0)

## 2023-06-24 LAB — LIPASE, BLOOD: Lipase: 53 U/L — ABNORMAL HIGH (ref 11–51)

## 2023-06-24 MED ORDER — HYDROMORPHONE HCL 1 MG/ML IJ SOLN
1.0000 mg | Freq: Once | INTRAMUSCULAR | Status: AC
  Administered 2023-06-24: 1 mg via INTRAVENOUS
  Filled 2023-06-24: qty 1

## 2023-06-24 MED ORDER — PANTOPRAZOLE SODIUM 40 MG IV SOLR
40.0000 mg | Freq: Once | INTRAVENOUS | Status: AC
  Administered 2023-06-24: 40 mg via INTRAVENOUS
  Filled 2023-06-24: qty 10

## 2023-06-24 MED ORDER — IOHEXOL 350 MG/ML SOLN
100.0000 mL | Freq: Once | INTRAVENOUS | Status: AC | PRN
  Administered 2023-06-24: 100 mL via INTRAVENOUS

## 2023-06-24 MED ORDER — ONDANSETRON HCL 4 MG/2ML IJ SOLN
4.0000 mg | Freq: Once | INTRAMUSCULAR | Status: AC
  Administered 2023-06-24: 4 mg via INTRAVENOUS
  Filled 2023-06-24: qty 2

## 2023-06-24 NOTE — Progress Notes (Signed)
Cardiology Office Note:  .   Date:  06/24/2023  ID:  Chris [name]., DOB 1940/12/29, MRN [id number] PCP: Donita Brooks, MD  [facility] Providers Cardiologist:  None    History of Present Illness: Chris [name]. is a 83 y.o. male with a past medical history of hypertension, hyperlipidemia, diabetes, history of stroke with no residual defects, carotid artery stenosis, IPF, CAD status post CABG with multiple subsequent PCI's, who returns today for follow-up.   Coronary artery disease with history of MI with CABG (1995) x3 vessels with LIMA to the LAD, SVG to RCA, SVG to OM, status post multiple coronary interventions.cardiac catheterization in 2000 with stent was placed x 3. Repeat left heart catheterization in 01/2014 with grafts without changes. He can underwent PCI to high-grade stenosis of the SVG to RCA in March 2022 and again in April 2023 needing aspiration thrombectomy followed by repeat stenting to the SVG to RCA who presented with an NSTEMI, EF had normalized from 35-40 to normal in May 2023. Carotid duplex completed 09/2021 revealed no significant change from the 2021 study with follow-up recommended in 6 months. He showed right ICA 50 to 69% and left ICA 1-15%. The study was completed in 04/24/2022 which revealed right ICA of 60-49%, and left ICA 60-49%, recommended repeat study yearly.    He was last seen in clinic 04/20/2023.  At that time he had continued complaints of shortness of breath of the last several months.  He had noted to be out of his furosemide.  He was under an increased amount of stress and due to close friend who died earlier in the day.  He was scheduled for an updated echocardiogram.  He returns to clinic today continues to be tearful about the death of his wife.  Continues to have complaints of shortness of breath.  He also continues to have left ankle pain which is a ankle where he previously had an ankle replacement.  States that the shortness of  breath has continued to worsen even though he is down 7 pounds since his last visit and after being started back on furosemide.  States that he has been compliant with his current medication with him dietary indiscretions.  Denies any hospitalizations or visits to the emergency department.  ROS: 10 point review of systems were reviewed and considered negative with exception was been listed in the HPI  Studies Reviewed: .       2D echo 05/11/2023 1. Left ventricular ejection fraction, by estimation, is 35 to 40%. Left  ventricular ejection fraction by PLAX is 47 %. The left ventricle has  moderately decreased function. The left ventricle demonstrates regional  wall motion abnormalities (mild global   hypokinesis with severe inferior wall hypokinesis). Left ventricular  diastolic parameters are consistent with Grade I diastolic dysfunction  (impaired relaxation).   2. Right ventricular systolic function is mild to moderately reduced. The  right ventricular size is mildly enlarged. Tricuspid regurgitation signal  is inadequate for assessing PA pressure.   3. The mitral valve is normal in structure. Mild mitral valve  regurgitation. No evidence of mitral stenosis.   4. The aortic valve is normal in structure. Aortic valve regurgitation is  not visualized. No aortic stenosis is present.   5. The inferior vena cava is normal in size with greater than 50%  respiratory variability, suggesting right atrial pressure of 3 mmHg.   Left Heart Catheterization 09/04/21:  LV: 121/10, EDP 26  mmHg.  Ao 118/62, mean 83 mmHg.  No pressure gradient across the aortic valve. LM: Large vessel, mild disease. LAD: Occluded in the midsegment.  Distal segment supplied by LIMA to LAD which is widely patent. LCx: Occluded in the proximal to mid segment.  OM1 is supplied by SVG to OM which is widely patent. RCA: Occluded in the midsegment just proximal to previously placed stent.  SVG to RCA is patent, previously  placed 3.5 x 38 mm resolute Onyx DES is widely patent in the distal RCA, proximal to mid 38 mm Taxus DES placed in May 2010 has a thrombotic 80% stenosis, focal.  There is mild luminal loss throughout the stent.   Intervention data: Successful filter wire protected aspiration thrombectomy followed by stenting of the proximal segment of the SVG to RCA with implantation of a 4.0 x 12 mm resolute frontier DES at 18 atmospheric pressure, post stenosis 0%.  TIMI-3 to TIMI-3  flow maintained.    Echocardiogram 10/15/2021:  Normal LV systolic function with visual EF 55-60%. Left ventricle cavity  is normal in size. Normal left ventricular wall thickness. Normal global  wall motion. Abnormal septal wall motion due to post-operative septum.  Normal diastolic filling pattern, normal LAP.  Pericardium is normal. Insignificant pericardial effusion. There is no hemodynamic significance.  Compared to 11/06/2020 LVEF 35-40% and RWMA have now improved, otherwise no significant change.    Carotid artery duplex 04/24/2022: Duplex suggests stenosis in the right internal carotid artery (16-49%). Duplex suggests stenosis in the left internal carotid artery (16-49%). Compared to the study done on 10/15/2021, right ICA stenosis of >50% normal appears to have regressed. Follow up in one year is appropriate if clinically indicated. Risk Assessment/Calculations:             Physical Exam:   VS:  BP 107/66   Pulse 87   Ht 5\' 8"  (1.727 m)   Wt 218 lb 9.6 oz (99.2 kg)   SpO2 95%   BMI 33.24 kg/m    Wt Readings from Last 3 Encounters:  06/24/23 218 lb 9.6 oz (99.2 kg)  06/22/23 220 lb (99.8 kg)  04/20/23 225 lb 12.8 oz (102.4 kg)    GEN: Well nourished, well developed in no acute distress NECK: No JVD; No carotid bruits CARDIAC: RRR, II/VI systolic murmur RUSB without rubs or gallops RESPIRATORY:  Clear to auscultation without rales, wheezing or rhonchi  ABDOMEN: Soft, non-tender, non-distended EXTREMITIES:  Trace pretibial edema; No deformity   ASSESSMENT AND PLAN: .   Coronary artery disease of native coronary artery with stable angina.  Patient continues to to deny anginal complaint is anginal equivalent of worsening shortness of breath even with restarting diuretic therapy and 7 pound weight drop.  He has been continued on aspirin 81 mg daily, clopidogrel 75 mg daily rosuvastatin 40 mg daily.  He had a thrombotic event that stented segment of the SVG in the RCA and will likely require long term clopidogrel therapy.  His last heart catheterization was completed in 08/2021 by Dr. Jacinto Halim.  He had stent placement at that time.  Chronic HFrEF with history of HFimpEF with recent reduced EF prior EF 55-60% recent echocardiogram revealed an LVEF of 35-40% with global hypokinesis.  With continued shortness of breath and dyspnea that is worsening even with diuretic therapy and decrease in his weight he has been scheduled for right and left heart catheterization with Dr. Jacinto Halim in [place].  He has been sent for preprocedure labs.  Will try to  continue to escalate GDMT but due to softer blood pressures today we are unable we will continue to escalate as tolerated by blood pressure and kidney function.  He is continued on Jardiance 25 mg daily and furosemide 20 mg daily.  Mixed hyperlipidemia this ostial 59.  Continue to rosuvastatin 40 mg daily.  Will need updated lipid panel on return.  Bilateral carotid artery stenosis with recent carotid duplex in 2020 revealing right ICA stenosis of 16-49% stenosis and the left internal carotid artery 16-49% compared to study 10/15/2021 right ICA stenosis of greater than 50% appears to have regressed to follow-up in 1 year if appropriate and clinically indicated.  Will revisit updated carotid duplex on return after his procedure has been completed.  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  He is continued on Jardiance 25 mg daily.  This continues to be managed by  his PCP.  Last serum creatinine of 1.27 04/20/2023.  Obesity with a BMI of 33.2.  Making prognosis more difficult.  Previously had been on Ozempic but that medication had to be stopped due to side effects. Informed Consent   Shared Decision Making/Informed Consent The risks [stroke (1 in 1000), death (1 in 1000), kidney failure [usually temporary] (1 in 500), bleeding (1 in 200), allergic reaction [possibly serious] (1 in 200)], benefits (diagnostic support and management of coronary artery disease) and alternatives of a cardiac catheterization were discussed in detail with Chris [name] and he is willing to proceed.           Dispo: Patient return to clinic 2 to 3 weeks postprocedure or sooner if needed  Signed, Luvern Mcisaac, NP

## 2023-06-24 NOTE — ED Provider Notes (Signed)
[facility] Provider Note    Event Date/Time   First MD Initiated Contact with Patient 06/24/23 2206     (approximate)   History   Abdominal Pain   HPI  Chris Borba. is a 83 y.o. male with history of hiatal hernia, coronary disease who comes in with concerns for abdominal pain.  To note I reviewed patient's cardiology note from today where patient was seen by cardiology noted to have an EF of 35 to 40% he is scheduled to have outpatient cath.  However today he states that he was eating food when he had sudden onset severe chest pain radiating to his abdomen.  He states that he does not feel anything got stuck in his throat that it feels lower down his left upper quadrant.  He states he was drinking water and did not have any vomiting but has never had this feeling before.   Physical Exam   Triage Vital Signs: ED Triage Vitals  Encounter Vitals Group     BP 06/24/23 2144 112/76     Systolic BP Percentile --      Diastolic BP Percentile --      Pulse Rate 06/24/23 2144 86     Resp 06/24/23 2144 18     Temp 06/24/23 2144 97.9 F (36.6 C)     Temp Source 06/24/23 2144 Oral     SpO2 06/24/23 2125 99 %     Weight --      Height --      Head Circumference --      Peak Flow --      Pain Score --      Pain Loc --      Pain Education --      Exclude from Growth Chart --     Most recent vital signs: Vitals:   06/24/23 2125 06/24/23 2144  BP:  112/76  Pulse:  86  Resp:  18  Temp:  97.9 F (36.6 C)  SpO2: 99%      General: Patient appears uncomfortable CV:  Good peripheral perfusion.  Resp:  Normal effort.  Abd:  No distention.  Tender in the left upper quadrant Other:  Trace edema   ED Results / Procedures / Treatments   Labs (all labs ordered are listed, but only abnormal results are displayed) Labs Reviewed  BASIC METABOLIC PANEL - Abnormal; Notable for the following components:      Result Value   Glucose, Bld 163 (*)     Creatinine, Ser 1.47 (*)    GFR, Estimated 48 (*)    All other components within normal limits  CBC - Abnormal; Notable for the following components:   Hemoglobin 10.6 (*)    HCT 36.4 (*)    MCV 72.2 (*)    MCH 21.0 (*)    MCHC 29.1 (*)    RDW 19.3 (*)    All other components within normal limits  LIPASE, BLOOD - Abnormal; Notable for the following components:   Lipase 53 (*)    All other components within normal limits  HEPATIC FUNCTION PANEL  BRAIN NATRIURETIC PEPTIDE  TROPONIN I (HIGH SENSITIVITY)     EKG  My interpretation of EKG:  Normal sinus rhythm 91 without any ST elevation, right bundle branch block left posterior fascicular block.  Similar to prior  RADIOLOGY CT is pending.  X-ray unable to crossover.   PROCEDURES:  Critical Care performed: No  Procedures   MEDICATIONS ORDERED IN  ED: Medications  iohexol (OMNIPAQUE) 350 MG/ML injection 100 mL (has no administration in time range)  HYDROmorphone (DILAUDID) injection 1 mg (1 mg Intravenous Given 06/24/23 2222)  ondansetron (ZOFRAN) injection 4 mg (4 mg Intravenous Given 06/24/23 2221)  pantoprazole (PROTONIX) injection 40 mg (40 mg Intravenous Given 06/24/23 2222)     IMPRESSION / MDM / ASSESSMENT AND PLAN / ED COURSE  I reviewed the triage vital signs and the nursing notes.   Patient's presentation is most consistent with acute presentation with potential threat to life or bodily function.   Patient comes in with significant chest pain, left upper quadrant abdominal pain.  No right upper pain to suggest gallstones.  Will get CT dissection to rule out dissection.  Blood work to evaluate for pancreatitis, choledocholithiasis, ACS.  Patient given a milligram of Dilaudid, Zofran, Protonix.  Patient be handed off pending results of CT imaging, troponins.  The patient is on the cardiac monitor to evaluate for evidence of arrhythmia and/or significant heart rate changes.      FINAL CLINICAL IMPRESSION(S) /  ED DIAGNOSES   Final diagnoses:  Abdominal pain, unspecified abdominal location     Rx / DC Orders   ED Discharge Orders     None        Note:  This document was prepared using Dragon voice recognition software and may include unintentional dictation errors.   Concha Se, MD 06/24/23 2233

## 2023-06-24 NOTE — Patient Instructions (Signed)
Medication Instructions:  Your physician recommends that you continue on your current medications as directed. Please refer to the Current Medication list given to you today.   *If you need a refill on your cardiac medications before your next appointment, please call your pharmacy*   Lab Work: Your provider would like for you to have following labs drawn today (CBC, BMP).     Testing/Procedures: Your physician has requested that you have a cardiac catheterization. Cardiac catheterization is used to diagnose and/or treat various heart conditions. Doctors may recommend this procedure for a number of different reasons. The most common reason is to evaluate chest pain. Chest pain can be a symptom of coronary artery disease (CAD), and cardiac catheterization can show whether plaque is narrowing or blocking your heart's arteries. This procedure is also used to evaluate the valves, as well as measure the blood flow and oxygen levels in different parts of your heart. For further information please visit [email or web address] Please follow instruction sheet, as given. Please see instructions below   Follow-Up: At [facility], you and your health needs are our priority.  As part of our continuing mission to provide you with exceptional heart care, we have created designated Provider Care Teams.  These Care Teams include your primary Cardiologist (physician) and Advanced Practice Providers (APPs -  Physician Assistants and Nurse Practitioners) who all work together to provide you with the care you need, when you need it.  We recommend signing up for the patient portal called "MyChart".  Sign up information is provided on this After Visit Summary.  MyChart is used to connect with patients for Virtual Visits (Telemedicine).  Patients are able to view lab/test results, encounter notes, upcoming appointments, etc.  Non-urgent messages can be sent to your provider as well.   To learn more about what you  can do with MyChart, go to [email or web address].    Your next appointment:   2-3 week(s) after Cath on 07/05/23  Provider:   Charlsie Quest, NP     [facility] A DEPT OF [place]. [facility] AT [facility] [address] [name], [address] [place] [address] Dept: [phone] Loc: [phone]  Chris [name].  06/24/2023  You are scheduled for a Cardiac Catheterization on Monday, February 10 with Dr.  Dr. Jacinto Halim .  1. Please arrive at the [facility] (Main Entrance A) at [facility]: [address] [place], [place] [address] at 8:30 AM (This time is 2 hour(s) before your procedure to ensure your preparation).   Free valet parking service is available. You will check in at ADMITTING. The support person will be asked to wait in the waiting room.  It is OK to have someone drop you off and come back when you are ready to be discharged.    Special note: Every effort is made to have your procedure done on time. Please understand that emergencies sometimes delay scheduled procedures.  2. Diet: Do not eat solid foods after midnight.  The patient may have clear liquids until 5am upon the day of the procedure.  3. Labs: You will need to have blood drawn today (CBC, BMP)  4. Medication instructions in preparation for your procedure:   Contrast Allergy: No Hold morning of procedure Jardiance Lasix Januvia  On the morning of your procedure, take your Aspirin 81 mg and Plavix/Clopidogrel and any morning medicines NOT listed above.  You may use sips of water.  5. Plan to go  home the same day, you will only stay overnight if medically necessary. 6. Bring a current list of your medications and current insurance cards. 7. You MUST have a responsible person to drive you home. 8. Someone MUST be with you the first 24 hours after you arrive home or your discharge will be delayed. 9. Please wear clothes that are easy to get on and off  and wear slip-on shoes.  Thank you for allowing [place] to care for you!   -- [facility]

## 2023-06-24 NOTE — ED Triage Notes (Addendum)
Patient C/O upper abdominal pain and nausea that began this evening after eating chicken. Patient states that he does have a hiatal hernia. Patient has his gallbladder. Patient had regular appointment with his cardiologist today and was told his EF was 22% and he is supposed to go to the cath lab on the 10th of Februrary. Patient took two 81mg 's aspirin before arrival. He is also on blood thinners.

## 2023-06-24 NOTE — ED Triage Notes (Signed)
EMS brings pt in from home for c/o CP after eating PTA; hx hiatal hernia

## 2023-06-25 ENCOUNTER — Other Ambulatory Visit: Payer: Self-pay

## 2023-06-25 ENCOUNTER — Encounter: Payer: Self-pay | Admitting: Internal Medicine

## 2023-06-25 DIAGNOSIS — R112 Nausea with vomiting, unspecified: Secondary | ICD-10-CM | POA: Diagnosis not present

## 2023-06-25 DIAGNOSIS — I5020 Unspecified systolic (congestive) heart failure: Secondary | ICD-10-CM

## 2023-06-25 LAB — CBC
Hematocrit: 37.3 % — ABNORMAL LOW (ref 37.5–51.0)
Hemoglobin: 10.9 g/dL — ABNORMAL LOW (ref 13.0–17.7)
MCH: 21 pg — ABNORMAL LOW (ref 26.6–33.0)
MCHC: 29.2 g/dL — ABNORMAL LOW (ref 31.5–35.7)
MCV: 72 fL — ABNORMAL LOW (ref 79–97)
Platelets: 361 10*3/uL (ref 150–450)
RBC: 5.2 x10E6/uL (ref 4.14–5.80)
RDW: 18.6 % — ABNORMAL HIGH (ref 11.6–15.4)
WBC: 9.2 10*3/uL (ref 3.4–10.8)

## 2023-06-25 LAB — CBG MONITORING, ED
Glucose-Capillary: 133 mg/dL — ABNORMAL HIGH (ref 70–99)
Glucose-Capillary: 156 mg/dL — ABNORMAL HIGH (ref 70–99)
Glucose-Capillary: 97 mg/dL (ref 70–99)

## 2023-06-25 LAB — BASIC METABOLIC PANEL
Anion gap: 10 (ref 5–15)
BUN/Creatinine Ratio: 8 — ABNORMAL LOW (ref 10–24)
BUN: 11 mg/dL (ref 8–27)
BUN: 14 mg/dL (ref 8–23)
CO2: 23 mmol/L (ref 20–29)
CO2: 28 mmol/L (ref 22–32)
Calcium: 9.8 mg/dL (ref 8.6–10.2)
Calcium: 8.7 mg/dL — ABNORMAL LOW (ref 8.9–10.3)
Chloride: 105 mmol/L (ref 96–106)
Chloride: 102 mmol/L (ref 98–111)
Creatinine, Ser: 1.34 mg/dL — ABNORMAL HIGH (ref 0.76–1.27)
Creatinine, Ser: 1.33 mg/dL — ABNORMAL HIGH (ref 0.61–1.24)
GFR, Estimated: 54 mL/min — ABNORMAL LOW (ref >60)
Glucose, Bld: 120 mg/dL — ABNORMAL HIGH (ref 70–99)
Glucose: 123 mg/dL — ABNORMAL HIGH (ref 70–99)
Potassium: 4.7 mmol/L (ref 3.5–5.2)
Potassium: 4.2 mmol/L (ref 3.5–5.1)
Sodium: 144 mmol/L (ref 134–144)
Sodium: 140 mmol/L (ref 135–145)
eGFR: 54 mL/min/{1.73_m2} — ABNORMAL LOW (ref >59)

## 2023-06-25 LAB — IRON AND TIBC
Iron: 22 ug/dL — ABNORMAL LOW (ref 45–182)
Saturation Ratios: 5 % — ABNORMAL LOW (ref 17.9–39.5)
TIBC: 465 ug/dL — ABNORMAL HIGH (ref 250–450)
UIBC: 443 ug/dL

## 2023-06-25 LAB — HEMOGLOBIN A1C
Hgb A1c MFr Bld: 6.8 % — ABNORMAL HIGH (ref 4.8–5.6)
Mean Plasma Glucose: 148.46 mg/dL

## 2023-06-25 LAB — FOLATE: Folate: 16 ng/mL (ref >5.9)

## 2023-06-25 LAB — VITAMIN B12: Vitamin B-12: 152 pg/mL — ABNORMAL LOW (ref 180–914)

## 2023-06-25 LAB — TROPONIN I (HIGH SENSITIVITY): Troponin I (High Sensitivity): 8 ng/L (ref <18)

## 2023-06-25 LAB — PHOSPHORUS: Phosphorus: 3.5 mg/dL (ref 2.5–4.6)

## 2023-06-25 LAB — MAGNESIUM: Magnesium: 2.3 mg/dL (ref 1.7–2.4)

## 2023-06-25 MED ORDER — ACETAMINOPHEN 325 MG PO TABS: 650.0000 mg | ORAL_TABLET | Freq: Four times a day (QID) | ORAL | Status: DC | PRN

## 2023-06-25 MED ORDER — ENOXAPARIN SODIUM 60 MG/0.6ML IJ SOSY
0.5000 mg/kg | PREFILLED_SYRINGE | INTRAMUSCULAR | Status: DC
  Administered 2023-06-25: 50 mg via SUBCUTANEOUS
  Filled 2023-06-25 (×2): qty 0.6

## 2023-06-25 MED ORDER — ONDANSETRON HCL 4 MG/2ML IJ SOLN
4.0000 mg | Freq: Once | INTRAMUSCULAR | Status: AC
  Administered 2023-06-25: 4 mg via INTRAVENOUS
  Filled 2023-06-25: qty 2

## 2023-06-25 MED ORDER — ONDANSETRON HCL 4 MG PO TABS: 4.0000 mg | ORAL_TABLET | Freq: Four times a day (QID) | ORAL | Status: DC | PRN

## 2023-06-25 MED ORDER — HYDROCODONE-ACETAMINOPHEN 5-325 MG PO TABS: 1.0000 | ORAL_TABLET | Freq: Four times a day (QID) | ORAL | 0 refills | Status: AC | PRN

## 2023-06-25 MED ORDER — CLOPIDOGREL BISULFATE 75 MG PO TABS
75.0000 mg | ORAL_TABLET | Freq: Every day | ORAL | Status: DC
  Administered 2023-06-25: 75 mg via ORAL
  Filled 2023-06-25: qty 1

## 2023-06-25 MED ORDER — PANTOPRAZOLE SODIUM 40 MG IV SOLR
40.0000 mg | INTRAVENOUS | Status: DC
  Filled 2023-06-25: qty 10

## 2023-06-25 MED ORDER — BISACODYL 5 MG PO TBEC
5.0000 mg | DELAYED_RELEASE_TABLET | Freq: Every evening | ORAL | 0 refills | Status: DC | PRN
Start: 2023-06-25 — End: 2023-08-28
  Filled 2023-06-25: qty 100, 100d supply, fill #0

## 2023-06-25 MED ORDER — ASPIRIN 81 MG PO TBEC
81.0000 mg | DELAYED_RELEASE_TABLET | Freq: Every day | ORAL | Status: DC
  Administered 2023-06-25: 81 mg via ORAL
  Filled 2023-06-25: qty 1

## 2023-06-25 MED ORDER — INSULIN ASPART 100 UNIT/ML IJ SOLN
0.0000 [IU] | INTRAMUSCULAR | Status: DC
  Administered 2023-06-25: 3 [IU] via SUBCUTANEOUS
  Filled 2023-06-25: qty 1

## 2023-06-25 MED ORDER — ONDANSETRON HCL 4 MG PO TABS: 4.0000 mg | ORAL_TABLET | Freq: Four times a day (QID) | ORAL | 0 refills | Status: AC | PRN

## 2023-06-25 MED ORDER — NITROGLYCERIN 0.4 MG SL SUBL: 0.4000 mg | SUBLINGUAL_TABLET | SUBLINGUAL | Status: DC | PRN

## 2023-06-25 MED ORDER — PANTOPRAZOLE SODIUM 40 MG PO TBEC: 40.0000 mg | DELAYED_RELEASE_TABLET | Freq: Every day | ORAL | Status: DC

## 2023-06-25 MED ORDER — TRAZODONE HCL 50 MG PO TABS: 25.0000 mg | ORAL_TABLET | Freq: Every evening | ORAL | Status: DC | PRN

## 2023-06-25 MED ORDER — PANTOPRAZOLE SODIUM 40 MG IV SOLR
40.0000 mg | Freq: Two times a day (BID) | INTRAVENOUS | Status: DC
  Administered 2023-06-25: 40 mg via INTRAVENOUS

## 2023-06-25 MED ORDER — PROMETHAZINE (PHENERGAN) 6.25MG IN NS 50ML IVPB: 6.2500 mg | Freq: Four times a day (QID) | INTRAVENOUS | Status: DC | PRN

## 2023-06-25 MED ORDER — HYDROCODONE-ACETAMINOPHEN 5-325 MG PO TABS: 1.0000 | ORAL_TABLET | Freq: Four times a day (QID) | ORAL | 0 refills | Status: DC | PRN

## 2023-06-25 MED ORDER — MORPHINE SULFATE (PF) 2 MG/ML IV SOLN
2.0000 mg | INTRAVENOUS | Status: DC | PRN
  Administered 2023-06-25: 2 mg via INTRAVENOUS
  Filled 2023-06-25: qty 1

## 2023-06-25 MED ORDER — ACETAMINOPHEN 650 MG RE SUPP: 650.0000 mg | Freq: Four times a day (QID) | RECTAL | Status: DC | PRN

## 2023-06-25 MED ORDER — ONDANSETRON HCL 4 MG/2ML IJ SOLN: 4.0000 mg | Freq: Four times a day (QID) | INTRAMUSCULAR | Status: DC | PRN

## 2023-06-25 MED ORDER — ROSUVASTATIN CALCIUM 20 MG PO TABS
40.0000 mg | ORAL_TABLET | Freq: Every day | ORAL | Status: DC
  Administered 2023-06-25: 40 mg via ORAL
  Filled 2023-06-25: qty 2

## 2023-06-25 MED ORDER — POLYSACCHARIDE IRON COMPLEX 150 MG PO CAPS
150.0000 mg | ORAL_CAPSULE | Freq: Every day | ORAL | 2 refills | Status: DC
  Filled 2023-06-25: qty 30, 30d supply, fill #0

## 2023-06-25 MED ORDER — HYDROMORPHONE HCL 1 MG/ML IJ SOLN
0.5000 mg | Freq: Once | INTRAMUSCULAR | Status: AC
  Administered 2023-06-25: 0.5 mg via INTRAVENOUS
  Filled 2023-06-25: qty 0.5

## 2023-06-25 MED ORDER — FUROSEMIDE 40 MG PO TABS: 20.0000 mg | ORAL_TABLET | Freq: Every day | ORAL | Status: DC

## 2023-06-25 NOTE — ED Notes (Signed)
Unsuccessful lab draw x2.  Main Lab asked to draw labs.

## 2023-06-25 NOTE — Assessment & Plan Note (Addendum)
Large hiatal hernia Antiemetics, IV Protonix, gentle IV hydration  aspiration precautions Will keep n.p.o. for now Electrolyte replacement

## 2023-06-25 NOTE — ED Notes (Signed)
 Pt verbalized understanding of discharge instructions. Opportunity for questions provided.

## 2023-06-25 NOTE — Assessment & Plan Note (Addendum)
CTA dissection study with no acute findings.large hiatal hernia noted Troponin negative x 2 and EKG nonacute Suspecting noncardiac chest pain

## 2023-06-25 NOTE — ED Provider Notes (Addendum)
Care of this patient assumed from prior physician at 2300 pending CTA, second troponin, and disposition. Please see prior physician note for further details.  Briefly this is an 83 year old male with history of hiatal hernia, CAD presenting for chest and abdominal pain that began when eating.  Stable vitals on presentation here.  Labs with stable anemia and renal dysfunction.  Minimally elevated lipase.  Initial troponin negative.  CXR without acute findings.  Second troponin and CT chest, abdomen, pelvis dissection protocol pending at time of signout.  CT was without evidence of dissection, did demonstrate a large hiatal hernia with majority of the stomach located intrathoracic leak, but no surrounding wall thickening or perigastric inflammation.  Findings concerning for ILD also noted.  Repeat troponin remained reassuring at 8.  Patient reassessed.  Pain is much improved, does have some ongoing nausea.  Updated on results of workup.  I suspect his discomfort is likely related to his large hiatal hernia, but his imaging and exam are overall not suggestive of acute strangulation.  Overall lower suspicion for acute cardiac pathology.  I did discuss admission versus discharge.  Patient is comfortable with discharge with symptomatic treatment and plans for outpatient follow-up.  Strict return precautions provided.  Patient discharged in stable condition.   Trinna Post, MD 06/25/23 0116  Prior to being discharged, notified by RN that patient had recurrent worsening pain.  He was reevaluated and does once again appear quite uncomfortable with ongoing nausea and chest and abdominal pain despite second round of pain and nausea medication.  With his cardiac history and uncontrolled pain, do think he is appropriate for admission for further evaluation.  Will reach out to hospitalist team to discuss admission.   Trinna Post, MD 06/25/23 [phone]

## 2023-06-25 NOTE — Progress Notes (Signed)
Anticoagulation monitoring(Lovenox):  83 yo male ordered Lovenox 40 mg Q24h    There were no vitals filed for this visit. BMI 33.2   Lab Results  Component Value Date   CREATININE 1.47 (H) 06/24/2023   CREATININE 1.27 04/20/2023   CREATININE 1.49 (H) 12/25/2022   Estimated Creatinine Clearance: 45.7 mL/min (A) (by C-G formula based on SCr of 1.47 mg/dL (H)). Hemoglobin & Hematocrit     Component Value Date/Time   HGB 10.6 (L) 06/24/2023 2140   HGB 10.5 (L) 04/20/2023 1425   HCT 36.4 (L) 06/24/2023 2140   HCT 36.1 (L) 04/20/2023 1425     Per Protocol for Patient with estCrcl > 30 ml/min and BMI > 30, will transition to Lovenox 50 mg Q24h.

## 2023-06-25 NOTE — Discharge Instructions (Addendum)
You were seen in the emergency department today for evaluation of your abdominal pain.  Your testing fortunately did not show an emergency cause for this.  I suspect it may be related to a large hiatal hernia that you have.  I recommend follow-up with GI specialist and surgery to further evaluate.  Please also keep your scheduled follow-up with cardiology.  I sent a prescription for a short course of pain medicine to your pharmacy.  Do not drive or operate machinery when taking this as it can make you drowsy.  I have also sent a prescription for nausea medicine.  Return to the ER for any new or worsening symptoms.

## 2023-06-25 NOTE — Assessment & Plan Note (Signed)
OSA Appears stable

## 2023-06-25 NOTE — Assessment & Plan Note (Signed)
 Sliding scale insulin coverage

## 2023-06-25 NOTE — H&P (Signed)
History and Physical    Patient: Chris [name]. ZOX:[id number] DOB: [birth year] DOA: 06/24/2023 DOS: the patient was seen and examined on 06/25/2023 PCP: Donita Brooks, MD  Patient coming from: Home  Chief Complaint:  Chief Complaint  Patient presents with   Abdominal Pain    HPI: Chris [name]. is a 83 y.o. 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  He was previously in his usual state of health.  He has chronic shortness of breath which is no worse and he denies lower extremity pain or swelling.  Denies cough fever or chills.  Denies diarrhea. ED course and data review: Vitals within normal limits Workup notable for the following: Troponin 11-->8, BNP 135 Lipase 53 and LFTs WNL WBC normal, hemoglobin 10.6 which is baseline Creatinine 1.47, baseline 1.27  EKG, personally viewed and interpreted showing NSR at 91 with RBBB.  No acute ST-T wave changes. CTA dissection study negative for dissection, shows large hiatal hernia with the majority of the stomach in the thoracic cavity among other nonacute findings  Patient treated with hydromorphone, Protonix, Zofran,  Hospitalist consulted for admission.   Review of Systems: As mentioned in the history of present illness. All other systems reviewed and are negative.  Past Medical History:  Diagnosis Date   Abnormal exercise myocardial perfusion study    03/2018- 38% ef, see report   Arthritis    Chronic kidney disease    Congestive heart failure (CHF) (HCC) 09/2015   Coronary artery disease    DOE (dyspnea on exertion)    ED (erectile dysfunction)    GERD (gastroesophageal reflux disease)    occ   Gout    H/O hiatal hernia     Hyperlipidemia    Hypertension    Myocardial infarction (HCC)    Neuropathy    toes   Obesity    RBBB (right bundle branch block)    Stenosis of right internal carotid artery    Type II diabetes mellitus (HCC)    Umbilical hernia    a. s/p repair.   Vertigo    Past Surgical History:  Procedure Laterality Date   ANKLE ARTHROSCOPY WITH DRILLING/MICROFRACTURE Left 04/13/2013   Procedure: LEFT ANKLE ARTHROSCOPY WITH EXTENSIVE DEBRIEDMENT;  Surgeon: Toni Arthurs, MD;  Location: [facility];  Service: Orthopedics;  Laterality: Left;   ANKLE SURGERY Left 08/18/2013   DR HEWITT   CARDIAC CATHETERIZATION  2000   stents x3   CARDIAC CATHETERIZATION  02/15/2014   Procedure: LEFT HEART CATH AND CORS/GRAFTS ANGIOGRAPHY;  Surgeon: Pamella Pert, MD;  Location: [facility] CATH LAB;  Service: Cardiovascular;;   COLONOSCOPY     CORONARY ARTERY BYPASS GRAFT  1995   LIMA to LAD, SVG to RCA, SVG to OM.    CORONARY STENT INTERVENTION N/A 08/12/2020   Procedure: CORONARY STENT INTERVENTION;  Surgeon: Elder Negus, MD;  Location: [facility] INVASIVE CV LAB;  Service: Cardiovascular;  Laterality: N/A;   CORONARY STENT INTERVENTION N/A 09/04/2021   Procedure: CORONARY STENT INTERVENTION;  Surgeon: Yates Decamp, MD;  Location: [facility] INVASIVE CV LAB;  Service: Cardiovascular;  Laterality: N/A;   LEFT HEART CATH AND CORS/GRAFTS ANGIOGRAPHY N/A 08/12/2020   Procedure: LEFT HEART CATH AND CORS/GRAFTS ANGIOGRAPHY;  Surgeon: Elder Negus, MD;  Location: [facility] INVASIVE CV LAB;  Service: Cardiovascular;  Laterality: N/A;   LEFT HEART CATH AND CORS/GRAFTS ANGIOGRAPHY N/A 09/04/2021   Procedure: LEFT HEART CATH AND CORS/GRAFTS ANGIOGRAPHY;  Surgeon: Yates Decamp, MD;  Location: [facility] INVASIVE CV LAB;  Service: Cardiovascular;  Laterality: N/A;   TOTAL ANKLE ARTHROPLASTY Left 08/17/2013   Procedure: LEFT TOTAL ANKLE REPLACEMENT WITH POSSIBLE GASTROC RECESSION ;  Surgeon: Toni Arthurs, MD;  Location: [facility] OR;  Service:  Orthopedics;  Laterality: Left;   UMBILICAL HERNIA REPAIR  12/2007   Social History:  reports that he quit smoking about 52 years ago. His smoking use included cigarettes. He started smoking about 61 years ago. He has a 9 pack-year smoking history. He has quit using smokeless tobacco.  His smokeless tobacco use included chew. He reports that he does not currently use alcohol. He reports that he does not use drugs.  Allergies  Allergen Reactions   Brilinta [Ticagrelor] Shortness Of Breath   Alpha-Gal Hives   Crestor [Rosuvastatin Calcium] Other (See Comments)    Muscle pain- tolerating this 2022, however   Lipitor [Atorvastatin] Other (See Comments)    Intolerable muscle pain all over     Family History  Problem Relation Age of Onset   Aneurysm Mother    Heart disease Father    Heart attack Father    Stroke Brother     Prior to Admission medications   Medication Sig Start Date End Date Taking? Authorizing Provider  HYDROcodone-acetaminophen (NORCO) 5-325 MG tablet Take 1 tablet by mouth every 6 (six) hours as needed for up to 5 days for moderate pain (pain score 4-6). 06/25/23 06/30/23 Yes Ray, Danie Binder, MD  ondansetron (ZOFRAN) 4 MG tablet Take 1 tablet (4 mg total) by mouth every 6 (six) hours as needed for up to 7 days for nausea or vomiting. 06/25/23 07/02/23 Yes Ray, Danie Binder, MD  acetaminophen (TYLENOL) 500 MG tablet Take 500 mg by mouth every 6 (six) hours as needed for mild pain.    [provider]  aspirin EC 81 MG tablet Take 1 tablet (81 mg total) by mouth daily. 02/16/14   Yates Decamp, MD  Blood Glucose Monitoring Suppl DEVI 1 each by Does not apply route in the morning, at noon, and at bedtime. May substitute to any manufacturer covered by patient's insurance. 11/16/22   Donita Brooks, MD  clopidogrel (PLAVIX) 75 MG tablet TAKE 1 TABLET EVERY DAY 11/23/22   Donita Brooks, MD  colchicine 0.6 MG tablet Take 0.6 mg by mouth daily.    [provider]  empagliflozin  (JARDIANCE) 25 MG TABS tablet Take 1 tablet (25 mg total) by mouth daily. 05/07/23   Donita Brooks, MD  furosemide (LASIX) 20 MG tablet Take 1 tablet (20 mg total) by mouth daily. 04/21/23 07/20/23  Donita Brooks, MD  nitroGLYCERIN (NITROSTAT) 0.4 MG SL tablet Place 1 tablet (0.4 mg total) under the tongue every 5 (five) minutes as needed for chest pain. 04/06/22   Donita Brooks, MD  pantoprazole (PROTONIX) 40 MG tablet Take 1 tablet (40 mg total) by mouth daily. 05/31/23   Donita Brooks, MD  rosuvastatin (CRESTOR) 40 MG tablet TAKE 1 TABLET EVERY DAY 06/10/23   Yates Decamp, MD  sitaGLIPtin (JANUVIA) 100 MG tablet Take 1 tablet (100 mg total) by mouth daily. 01/28/23   Donita Brooks, MD  sucralfate (CARAFATE) 1 g tablet TAKE 1 TABLET BY MOUTH FOUR TIMES DAILY WITH  MEALS AND AT BEDTIME 05/12/23   Donita Brooks, MD  traZODone (DESYREL) 50 MG tablet Take 0.5 tablets (25 mg total) by mouth at bedtime as needed for sleep. 06/22/23   Park Meo, FNP    Physical Exam: Vitals:   06/25/23 0000 06/25/23 0030 06/25/23 0100 06/25/23 0130  BP: 109/77 134/72 (!) 138/97 (!) 144/97  Pulse: 75 86 83 83  Resp: 17     Temp:      TempSrc:      SpO2: 97% 98% 100% 94%   Physical Exam Vitals and nursing note reviewed.  Constitutional:      General: He is not in acute distress.    Comments: Patient vomiting into emesis bag, complaining of pain  HENT:     Head: Normocephalic and atraumatic.  Cardiovascular:     Rate and Rhythm: Normal rate and regular rhythm.     Heart sounds: Normal heart sounds.  Pulmonary:     Effort: Pulmonary effort is normal.     Breath sounds: Normal breath sounds.  Abdominal:     Palpations: Abdomen is soft.     Tenderness: There is abdominal tenderness in the epigastric area.  Neurological:     Mental Status: Mental status is at baseline.     Labs on Admission: I have personally reviewed following labs and imaging studies  CBC: Recent Labs  Lab  06/24/23 2140  WBC 9.1  HGB 10.6*  HCT 36.4*  MCV 72.2*  PLT 359   Basic Metabolic Panel: Recent Labs  Lab 06/24/23 2140  NA 140  K 4.1  CL 103  CO2 22  GLUCOSE 163*  BUN 13  CREATININE 1.47*  CALCIUM 9.2   GFR: Estimated Creatinine Clearance: 45.7 mL/min (A) (by C-G formula based on SCr of 1.47 mg/dL (H)). Liver Function Tests: Recent Labs  Lab 06/24/23 2230  AST 32  ALT 11  ALKPHOS 44  BILITOT 0.6  PROT 7.8  ALBUMIN 3.6   Recent Labs  Lab 06/24/23 2140  LIPASE 53*   No results for input(s): "AMMONIA" in the last 168 hours. Coagulation Profile: No results for input(s): "INR", "PROTIME" in the last 168 hours. Cardiac Enzymes: No results for input(s): "CKTOTAL", "CKMB", "CKMBINDEX", "TROPONINI" in the last 168 hours. BNP (last 3 results) No results for input(s): "PROBNP" in the last 8760 hours. HbA1C: No results for input(s): "HGBA1C" in the last 72 hours. CBG: No results for input(s): "GLUCAP" in the last 168 hours. Lipid Profile: No results for input(s): "CHOL", "HDL", "LDLCALC", "TRIG", "CHOLHDL", "LDLDIRECT" in the last 72 hours. Thyroid Function Tests: No results for input(s): "TSH", "T4TOTAL", "FREET4", "T3FREE", "THYROIDAB" in the last 72 hours. Anemia Panel: No results for input(s): "VITAMINB12", "FOLATE", "FERRITIN", "TIBC", "IRON", "RETICCTPCT" in the last 72 hours. Urine analysis:    Component Value Date/Time   COLORURINE YELLOW 10/15/2020 1321   APPEARANCEUR CLEAR 10/15/2020 1321   LABSPEC 1.020 10/15/2020 1321   PHURINE 5.5 10/15/2020 1321   GLUCOSEU NEGATIVE 10/15/2020 1321   HGBUR NEGATIVE 10/15/2020 1321   BILIRUBINUR NEGATIVE 10/15/2020 1321   KETONESUR TRACE (A) 10/15/2020 1321   PROTEINUR NEGATIVE 10/15/2020 1321   NITRITE NEGATIVE 10/15/2020 1321   LEUKOCYTESUR NEGATIVE 10/15/2020 1321    Radiological Exams on Admission: CT Angio Chest/Abd/Pel for Dissection W and/or Wo Contrast Result Date: 06/24/2023 CLINICAL DATA:  Acute  aortic syndrome (AAS) suspected Chest pain after eating. EXAM: CT ANGIOGRAPHY CHEST, ABDOMEN AND PELVIS TECHNIQUE: Non-contrast CT of the chest was initially obtained. Multidetector CT  imaging through the chest, abdomen and pelvis was performed using the standard protocol during bolus administration of intravenous contrast. Multiplanar reconstructed images and MIPs were obtained and reviewed to evaluate the vascular anatomy. RADIATION DOSE REDUCTION: This exam was performed according to the departmental dose-optimization program which includes automated exposure control, adjustment of the mA and/or kV according to patient size and/or use of iterative reconstruction technique. CONTRAST:  OMNIPAQUE IOHEXOL 350 MG/ML SOLN COMPARISON:  Abdominopelvic CT 01/05/2023, CT angiography 05/06/2021 FINDINGS: CTA CHEST FINDINGS Cardiovascular: No aortic hematoma on noncontrast exam. Mild aortic atherosclerosis. Post CABG. No aortic dissection, aneurysm, or acute aortic findings. The aorta is tortuous. Heart size upper normal. Calcification of native coronary arteries. There is no central pulmonary embolus. No pericardial effusion. Mediastinum/Nodes: Large hiatal hernia, the majority of the stomach is intrathoracic. There may be herniation of a portion of the stomach below the diaphragm before it courses distally. No associated wall thickening or perigastric inflammation. There is no dilatation of the esophagus. 13 mm lower paratracheal node has been present since 2022 and is likely reactive. No enlarging mediastinal lymph nodes. Hilar assessment is limited in the absence of IV contrast. Lungs/Pleura: Findings suspicious for interstitial lung disease with ground-glass opacity, architectural distortion and bronchiectasis in the lung bases. There is chronic volume loss in the right hemithorax. No acute airspace disease or significant pleural effusion. No features of pulmonary edema. Minimal layering debris in the trachea.  Musculoskeletal: Prior median sternotomy. There are no acute or suspicious osseous abnormalities. Review of the MIP images confirms the above findings. CTA ABDOMEN AND PELVIS FINDINGS VASCULAR Aorta: Moderate calcified atherosclerosis. Normal caliber aorta without aneurysm, dissection, vasculitis or significant stenosis. Celiac: Patent without evidence of aneurysm, dissection, vasculitis or significant stenosis. SMA: Patent without evidence of aneurysm, dissection, vasculitis or significant stenosis. Renals: 2 left and single right renal arteries. Mild narrowing of the origin of both left renal arteries. Patent distally. IMA: Patent without evidence of aneurysm, dissection, vasculitis or significant stenosis. Inflow: Patent without evidence of aneurysm, dissection, vasculitis or significant stenosis. Veins: No obvious venous abnormality within the limitations of this arterial phase study. Review of the MIP images confirms the above findings. NON-VASCULAR Hepatobiliary: Unremarkable appearance of the liver on this arterial phase exam. Gallbladder physiologically distended, no calcified stone. No biliary dilatation. Pancreas: No ductal dilatation or inflammation. Spleen: Normal in size without focal abnormality. Adrenals/Urinary Tract: No adrenal nodule. Nonobstructing right renal calculi. No hydronephrosis. Right renal cyst. No further follow-up imaging is recommended. Unremarkable urinary bladder. Stomach/Bowel: Large hiatal hernia as described. No small or large bowel obstruction or inflammation. Left colonic diverticula without diverticulitis. The appendix is normal. Lymphatic: No adenopathy. Reproductive: Enlarged prostate gland causes mass effect on the bladder base. Other: No free air, free fluid, or intra-abdominal fluid collection. Musculoskeletal: There are no acute or suspicious osseous abnormalities. Review of the MIP images confirms the above findings. IMPRESSION: 1. No aortic dissection, aneurysm, or  acute aortic findings. 2. Large hiatal hernia, the majority of the stomach is intrathoracic. There may be herniation of a portion of the stomach below the diaphragm before it courses distally. There is no proximal esophageal dilatation to suggest gastric obstruction. No associated wall thickening or perigastric inflammation. 3. Findings suspicious for interstitial lung disease with ground-glass opacity, architectural distortion and bronchiectasis in the lung bases. Consider high-resolution chest CT in 6 months to assess for temporal change. 4. Nonobstructing right renal calculi. 5. Colonic diverticulosis without diverticulitis. 6. Enlarged prostate gland causes mass effect on  the bladder base. Aortic Atherosclerosis (ICD10-I70.0). Electronically Signed   By: Narda Rutherford M.D.   On: 06/24/2023 23:20   DG Chest 1 View Result Date: 06/24/2023 CLINICAL DATA:  Chest pain EXAM: PORTABLE CHEST 1 VIEW COMPARISON:  10/04/2021 FINDINGS: Cardiac shadow is stable. Hiatal hernia is again identified. Postsurgical changes are noted. Diffuse interstitial changes are noted of a chronic nature. No focal infiltrate or effusion is seen. IMPRESSION: Chronic interstitial changes without acute abnormality. Electronically Signed   By: Alcide Clever M.D.   On: 06/24/2023 22:25     Data Reviewed: Relevant notes from primary care and specialist visits, past discharge summaries as available in EHR, including Care Everywhere. Prior diagnostic testing as pertinent to current admission diagnoses Updated medications and problem lists for reconciliation ED course, including vitals, labs, imaging, treatment and response to treatment Triage notes, nursing and pharmacy notes and ED provider's notes Notable results as noted in HPI   Assessment and Plan: * Intractable vomiting with nausea Large hiatal hernia Antiemetics, IV Protonix, gentle IV hydration  aspiration precautions Will keep n.p.o. for now Electrolyte  replacement  Chest pain CTA dissection study with no acute findings.large hiatal hernia noted Troponin negative x 2 and EKG nonacute Suspecting noncardiac chest pain  CAD (coronary artery disease) Patient with chest pain but likely not related to ACS, trop neg x 2 Continue aspirin, clopidogrel, rosuvastatin and nitroglycerin Has upcoming LHC on 07/05/23  HFrEF (heart failure with reduced ejection fraction) (HCC) Last EF 35 to 40% Pending cardiac cath on 07/05/2023 Continue Lasix Can consider cardiology consult  Pulmonary fibrosis (HCC) OSA Appears stable  Type II diabetes mellitus (HCC) Sliding scale insulin coverage     DVT prophylaxis: Lovenox  Consults: none  Advance Care Planning:   Code Status: Prior   Family Communication: none  Disposition Plan: Back to previous home environment  Severity of Illness: The appropriate patient status for this patient is INPATIENT. Inpatient status is judged to be reasonable and necessary in order to provide the required intensity of service to ensure the patient's safety. The patient's presenting symptoms, physical exam findings, and initial radiographic and laboratory data in the context of their chronic comorbidities is felt to place them at high risk for further clinical deterioration. Furthermore, it is not anticipated that the patient will be medically stable for discharge from the hospital within 2 midnights of admission.   * I certify that at the point of admission it is my clinical judgment that the patient will require inpatient hospital care spanning beyond 2 midnights from the point of admission due to high intensity of service, high risk for further deterioration and high frequency of surveillance required.*  Author: Andris Baumann, MD 06/25/2023 3:48 AM  For on call review [email or web address].

## 2023-06-25 NOTE — Assessment & Plan Note (Addendum)
Patient with chest pain but likely not related to ACS, trop neg x 2 Continue aspirin, clopidogrel, rosuvastatin and nitroglycerin Has upcoming LHC on 07/05/23

## 2023-06-25 NOTE — ED Notes (Signed)
Pt reports the pain in his stomach is much better. NAD noted.

## 2023-06-25 NOTE — ED Notes (Signed)
Pt ambulates w/ walker to BR to urinate.

## 2023-06-25 NOTE — Discharge Summary (Incomplete)
[facility] Discharge Summary   Patient: Chris [name]. ION:[id number]  PCP: Donita Brooks, MD  Date of admission: 06/24/2023   Date of discharge:  06/25/2023     Discharge Diagnoses:  Principal Problem:   Intractable vomiting with nausea Active Problems:   Large hiatal hernia   Chest pain   CAD (coronary artery disease)   HFrEF (heart failure with reduced ejection fraction) (HCC)   Type II diabetes mellitus (HCC)   OSA (obstructive sleep apnea)   Pulmonary fibrosis (HCC)   Admitted From: Home Disposition:  Home   Recommendations for Outpatient Follow-up:  Follow-up with PCP in 1 week, continue PPI.  Started iron supplement, transferrin saturation 5%.  Repeat iron profile after 3 to 6 months.  Follow B12 level which is pending Follow-up with GI and general surgery in 1 to 2 weeks for hiatal hernia Follow up LABS/TEST:  As above   Follow-up Information     Vanga, Loel Dubonnet, MD. Schedule an appointment as soon as possible for a visit .   Specialty: Gastroenterology Contact information: [address] [place] [address] [phone]         Henrene Dodge, MD. Schedule an appointment as soon as possible for a visit .   Specialty: General Surgery Contact information: [address] [place] [address] [phone]         Donita Brooks, MD Follow up in 1 week(s).   Specialty: Family Medicine Contact information: [address] [place] [address] [phone]                Diet recommendation: Cardiac and Carb modified diet  Activity: The patient is advised to gradually reintroduce usual activities, as tolerated  Discharge Condition: stable  Code Status: Full code   History of present illness: As per the H and P dictated on admission Hospital Course:  Chris [name]. is a 83 y.o. male with medical history significant for coronary disease, hypertension, idiopathic pulmonary fibrosis, chronic  HFrEF (EF 35 to 40%) scheduled for LHC on 07/05/2023,  DM, CKD 3B, who presents to the ED with sudden onset low sternal chest pain radiating to the abdomen, associated with nausea and subsequently nonbloody nonbilious vomiting.  He was previously in his usual state of health.  He has chronic shortness of breath which is no worse and he denies lower extremity pain or swelling.  Denies cough fever or chills.  Denies diarrhea. ED course and data review: Vitals within normal limits Workup notable for the following: Troponin 11-->8, BNP 135 Lipase 53 and LFTs WNL WBC normal, hemoglobin 10.6 which is baseline Creatinine 1.47, baseline 1.27 EKG, personally viewed and interpreted showing NSR at 91 with RBBB.  No acute ST-T wave changes. CTA dissection study negative for dissection, shows large hiatal hernia with the majority of the stomach in the thoracic cavity among other nonacute findings   Patient treated with hydromorphone, Protonix, Zofran, Hospitalist consulted for admission.   Assessment and Plan:   # Intractable vomiting with nausea, Resolved  # Large hiatal hernia S/p Antiemetics, IV Protonix, gentle IV hydration.  Nausea and vomiting resolved, currently patient asymptomatic.  Tolerating diet well. Patient states that he may have a bed chicken and did not chew well that may have caused him trouble.  Currently patient is feeling fine and he would like to be discharged home today.  Medically stable, cleared to discharge home.   # Chest pain CTA dissection study with  no acute findings.large hiatal hernia noted Troponin negative x 2 and EKG nonacute. Suspecting noncardiac chest pain   # CAD (coronary artery disease) Patient with chest pain but likely not related to ACS, trop neg x 2 Continue aspirin, clopidogrel, rosuvastatin and nitroglycerin Has upcoming LHC on 07/05/23   # HFrEF (heart failure with reduced ejection fraction) (HCC) Last EF 35 to 40%, Pending cardiac cath on  07/05/2023 Continue Lasix, f/u cardiology out pateint # Pulmonary fibrosis and OSA: Appears stable # Type II diabetes mellitus: Sliding scale insulin coverage   # Vitamin B12 deficiency: B12 level 132, goal to keep above 400. Prescribed vitamin B12 1000 mcg p.o. daily for 6 months.  Prescription sent to pharmacy and hide called and informed patient on 06/26/2023. Follow-up PCP to repeat vitamin B12 level after 3 to 6 months.   Body mass index is 33.15 kg/m.  Nutrition Interventions:  Patient was ambulatory without any assistance. On the day of the discharge the patient's vitals were stable, and no other acute medical condition were reported by patient. the patient was felt safe to be discharge at Home.  Consultants: None Procedures: None  Discharge Exam: General: Appear in no distress, no Rash; Oral Mucosa Clear, moist. Cardiovascular: S1 and S2 Present, no Murmur, Respiratory: normal respiratory effort, Bilateral Air entry present and no Crackles, no wheezes Abdomen: Bowel Sound present, Soft and no tenderness, no hernia Extremities: no Pedal edema, no calf tenderness Neurology: alert and oriented to time, place, and person affect appropriate.  Filed Weights   06/25/23 0700  Weight: 98.9 kg   Vitals:   06/25/23 1130 06/25/23 1200  BP: 129/86 130/75  Pulse: 95 69  Resp: 18 12  Temp:    SpO2: 95% 94%    DISCHARGE MEDICATION: Allergies as of 06/25/2023       Reactions   Brilinta [ticagrelor] Shortness Of Breath   Alpha-gal Hives   Crestor [rosuvastatin Calcium] Other (See Comments)   Muscle pain- tolerating this 2022, however   Lipitor [atorvastatin] Other (See Comments)   Intolerable muscle pain all over         Medication List     TAKE these medications    acetaminophen 500 MG tablet Commonly known as: TYLENOL Take 500 mg by mouth every 6 (six) hours as needed for mild pain.   aspirin EC 81 MG tablet Take 1 tablet (81 mg total) by mouth daily.    bisacodyl 5 MG EC tablet Commonly known as: Dulcolax Take 1 tablet (5 mg total) by mouth at bedtime as needed for moderate constipation.   Blood Glucose Monitoring Suppl Devi 1 each by Does not apply route in the morning, at noon, and at bedtime. May substitute to any manufacturer covered by patient's insurance.   clopidogrel 75 MG tablet Commonly known as: PLAVIX TAKE 1 TABLET EVERY DAY   colchicine 0.6 MG tablet Take 0.6 mg by mouth daily.   empagliflozin 25 MG Tabs tablet Commonly known as: Jardiance Take 1 tablet (25 mg total) by mouth daily.   furosemide 20 MG tablet Commonly known as: LASIX Take 1 tablet (20 mg total) by mouth daily.   HYDROcodone-acetaminophen 5-325 MG tablet Commonly known as: Norco Take 1 tablet by mouth every 6 (six) hours as needed for up to 5 days for moderate pain (pain score 4-6).   iron polysaccharides 150 MG capsule Commonly known as: NIFEREX Take 1 capsule (150 mg total) by mouth daily.   nitroGLYCERIN 0.4 MG SL tablet Commonly known as: NITROSTAT  Place 1 tablet (0.4 mg total) under the tongue every 5 (five) minutes as needed for chest pain.   ondansetron 4 MG tablet Commonly known as: Zofran Take 1 tablet (4 mg total) by mouth every 6 (six) hours as needed for up to 7 days for nausea or vomiting.   pantoprazole 40 MG tablet Commonly known as: PROTONIX Take 1 tablet (40 mg total) by mouth daily.   rosuvastatin 40 MG tablet Commonly known as: CRESTOR TAKE 1 TABLET EVERY DAY   sitaGLIPtin 100 MG tablet Commonly known as: Januvia Take 1 tablet (100 mg total) by mouth daily.   sucralfate 1 g tablet Commonly known as: CARAFATE TAKE 1 TABLET BY MOUTH FOUR TIMES DAILY WITH MEALS AND AT BEDTIME   traZODone 50 MG tablet Commonly known as: DESYREL Take 0.5 tablets (25 mg total) by mouth at bedtime as needed for sleep.       Allergies  Allergen Reactions   Brilinta [Ticagrelor] Shortness Of Breath   Alpha-Gal Hives   Crestor  [Rosuvastatin Calcium] Other (See Comments)    Muscle pain- tolerating this 2022, however   Lipitor [Atorvastatin] Other (See Comments)    Intolerable muscle pain all over    Discharge Instructions     Call MD for:  difficulty breathing, headache or visual disturbances   Complete by: As directed    Call MD for:  extreme fatigue   Complete by: As directed    Call MD for:  persistant dizziness or light-headedness   Complete by: As directed    Call MD for:  persistant nausea and vomiting   Complete by: As directed    Call MD for:  severe uncontrolled pain   Complete by: As directed    Call MD for:  temperature >100.4   Complete by: As directed    Diet - low sodium heart healthy   Complete by: As directed    Discharge instructions   Complete by: As directed    Follow-up with PCP in 1 week, continue PPI.  Started iron supplement, transferrin saturation 5%.  Repeat iron profile after 3 to 6 months.  Follow B12 level which is pending Follow-up with GI and general surgery in 1 to 2 weeks for hiatal hernia   Increase activity slowly   Complete by: As directed        The results of significant diagnostics from this hospitalization (including imaging, microbiology, ancillary and laboratory) are listed below for reference.    Significant Diagnostic Studies: CT Angio Chest/Abd/Pel for Dissection W and/or Wo Contrast Result Date: 06/24/2023 CLINICAL DATA:  Acute aortic syndrome (AAS) suspected Chest pain after eating. EXAM: CT ANGIOGRAPHY CHEST, ABDOMEN AND PELVIS TECHNIQUE: Non-contrast CT of the chest was initially obtained. Multidetector CT imaging through the chest, abdomen and pelvis was performed using the standard protocol during bolus administration of intravenous contrast. Multiplanar reconstructed images and MIPs were obtained and reviewed to evaluate the vascular anatomy. RADIATION DOSE REDUCTION: This exam was performed according to the departmental dose-optimization program which  includes automated exposure control, adjustment of the mA and/or kV according to patient size and/or use of iterative reconstruction technique. CONTRAST:  OMNIPAQUE IOHEXOL 350 MG/ML SOLN COMPARISON:  Abdominopelvic CT 01/05/2023, CT angiography 05/06/2021 FINDINGS: CTA CHEST FINDINGS Cardiovascular: No aortic hematoma on noncontrast exam. Mild aortic atherosclerosis. Post CABG. No aortic dissection, aneurysm, or acute aortic findings. The aorta is tortuous. Heart size upper normal. Calcification of native coronary arteries. There is no central pulmonary embolus. No pericardial effusion. Mediastinum/Nodes:  Large hiatal hernia, the majority of the stomach is intrathoracic. There may be herniation of a portion of the stomach below the diaphragm before it courses distally. No associated wall thickening or perigastric inflammation. There is no dilatation of the esophagus. 13 mm lower paratracheal node has been present since 2022 and is likely reactive. No enlarging mediastinal lymph nodes. Hilar assessment is limited in the absence of IV contrast. Lungs/Pleura: Findings suspicious for interstitial lung disease with ground-glass opacity, architectural distortion and bronchiectasis in the lung bases. There is chronic volume loss in the right hemithorax. No acute airspace disease or significant pleural effusion. No features of pulmonary edema. Minimal layering debris in the trachea. Musculoskeletal: Prior median sternotomy. There are no acute or suspicious osseous abnormalities. Review of the MIP images confirms the above findings. CTA ABDOMEN AND PELVIS FINDINGS VASCULAR Aorta: Moderate calcified atherosclerosis. Normal caliber aorta without aneurysm, dissection, vasculitis or significant stenosis. Celiac: Patent without evidence of aneurysm, dissection, vasculitis or significant stenosis. SMA: Patent without evidence of aneurysm, dissection, vasculitis or significant stenosis. Renals: 2 left and single right renal  arteries. Mild narrowing of the origin of both left renal arteries. Patent distally. IMA: Patent without evidence of aneurysm, dissection, vasculitis or significant stenosis. Inflow: Patent without evidence of aneurysm, dissection, vasculitis or significant stenosis. Veins: No obvious venous abnormality within the limitations of this arterial phase study. Review of the MIP images confirms the above findings. NON-VASCULAR Hepatobiliary: Unremarkable appearance of the liver on this arterial phase exam. Gallbladder physiologically distended, no calcified stone. No biliary dilatation. Pancreas: No ductal dilatation or inflammation. Spleen: Normal in size without focal abnormality. Adrenals/Urinary Tract: No adrenal nodule. Nonobstructing right renal calculi. No hydronephrosis. Right renal cyst. No further follow-up imaging is recommended. Unremarkable urinary bladder. Stomach/Bowel: Large hiatal hernia as described. No small or large bowel obstruction or inflammation. Left colonic diverticula without diverticulitis. The appendix is normal. Lymphatic: No adenopathy. Reproductive: Enlarged prostate gland causes mass effect on the bladder base. Other: No free air, free fluid, or intra-abdominal fluid collection. Musculoskeletal: There are no acute or suspicious osseous abnormalities. Review of the MIP images confirms the above findings. IMPRESSION: 1. No aortic dissection, aneurysm, or acute aortic findings. 2. Large hiatal hernia, the majority of the stomach is intrathoracic. There may be herniation of a portion of the stomach below the diaphragm before it courses distally. There is no proximal esophageal dilatation to suggest gastric obstruction. No associated wall thickening or perigastric inflammation. 3. Findings suspicious for interstitial lung disease with ground-glass opacity, architectural distortion and bronchiectasis in the lung bases. Consider high-resolution chest CT in 6 months to assess for temporal change.  4. Nonobstructing right renal calculi. 5. Colonic diverticulosis without diverticulitis. 6. Enlarged prostate gland causes mass effect on the bladder base. Aortic Atherosclerosis (ICD10-I70.0). Electronically Signed   By: Narda Rutherford M.D.   On: 06/24/2023 23:20   DG Chest 1 View Result Date: 06/24/2023 CLINICAL DATA:  Chest pain EXAM: PORTABLE CHEST 1 VIEW COMPARISON:  10/04/2021 FINDINGS: Cardiac shadow is stable. Hiatal hernia is again identified. Postsurgical changes are noted. Diffuse interstitial changes are noted of a chronic nature. No focal infiltrate or effusion is seen. IMPRESSION: Chronic interstitial changes without acute abnormality. Electronically Signed   By: Alcide Clever M.D.   On: 06/24/2023 22:25    Microbiology: No results found for this or any previous visit (from the past 240 hours).   Labs: CBC: Recent Labs  Lab 06/24/23 1430 06/24/23 2140  WBC 9.2 9.1  HGB 10.9*  10.6*  HCT 37.3* 36.4*  MCV 72* 72.2*  PLT 361 359   Basic Metabolic Panel: Recent Labs  Lab 06/24/23 1430 06/24/23 2140 06/25/23 0940  NA 144 140 140  K 4.7 4.1 4.2  CL 105 103 102  CO2 23 22 28   GLUCOSE 123* 163* 120*  BUN 11 13 14   CREATININE 1.34* 1.47* 1.33*  CALCIUM 9.8 9.2 8.7*  MG  --   --  2.3  PHOS  --   --  3.5   Liver Function Tests: Recent Labs  Lab 06/24/23 2230  AST 32  ALT 11  ALKPHOS 44  BILITOT 0.6  PROT 7.8  ALBUMIN 3.6   Recent Labs  Lab 06/24/23 2140  LIPASE 53*   No results for input(s): "AMMONIA" in the last 168 hours. Cardiac Enzymes: No results for input(s): "CKTOTAL", "CKMB", "CKMBINDEX", "TROPONINI" in the last 168 hours. BNP (last 3 results) Recent Labs    06/24/23 2230  BNP 135.3*   CBG: Recent Labs  Lab 06/25/23 0431 06/25/23 0812 06/25/23 1206  GLUCAP 133* 156* 97    Time spent: 35 minutes  Signed:  Gillis Santa  [facility] 06/25/2023 12:31 PM

## 2023-06-25 NOTE — Assessment & Plan Note (Signed)
Last EF 35 to 40% Pending cardiac cath on 07/05/2023 Continue Lasix Can consider cardiology consult

## 2023-06-25 NOTE — ED Notes (Signed)
Hospitalist reports he removed Pt from oxygen.  If Pt sat remains WDL, then the Pt will be discharged.

## 2023-06-26 ENCOUNTER — Other Ambulatory Visit: Payer: Self-pay | Admitting: Student

## 2023-06-26 MED ORDER — VITAMIN B-12 1000 MCG PO TABS
1000.0000 ug | ORAL_TABLET | Freq: Every day | ORAL | 1 refills | Status: DC
  Filled 2023-06-26: qty 90, 90d supply, fill #0

## 2023-06-26 NOTE — Progress Notes (Signed)
Vitamin B12 deficiency, vitamin B12 level is 152, very low, Goal is to keep >400. I called patient and informed regarding vitamin B12 deficiency and sent vitamin B12 prescription to the pharmacy. F/u with PCP to repeat B12 level after 3 to 6 months

## 2023-06-27 ENCOUNTER — Other Ambulatory Visit: Payer: Self-pay

## 2023-06-28 ENCOUNTER — Telehealth: Payer: Self-pay

## 2023-06-28 ENCOUNTER — Other Ambulatory Visit: Payer: Self-pay

## 2023-06-28 ENCOUNTER — Ambulatory Visit: Payer: Self-pay | Admitting: Family Medicine

## 2023-06-28 NOTE — Telephone Encounter (Signed)
Chief Complaint: difficulty swallowing Symptoms: vomited once yesterday morning, productive cough with green mucus, unable to swallow food or water, "stomach soreness", belching Frequency: worsened x 2 days Pertinent Negatives: Patient denies sensation of something stuck in throat, drooling, Disposition: [x] ED /[] Urgent Care (no appt availability in office) / [] Appointment(In office/virtual)/ []  [facility]/ [] Home Care/ [x] Refused Recommended Disposition /[]  Mobile Bus/ []  Follow-up with PCP Additional Notes: Patient states he was discharged yesterday from the hospital. Patient states while in the hospital he was only allowed to eat ice chips. Patient states yesterday he was drinking Boost and had difficulty swallowing. Today patient states he ate scrambled eggs and they felt like they got stuck in his stomach, he was unable to drink water. Patient states it causes a soreness in his stomach. Patient with persistent cough while on phone. While speaking with triage RN patient states he feels like it has passed more and he plans to take his pain pill with water. Advised patient go to ED and he is reluctant due to just being discharged from hospital. Attempted to call [facility], closed at this time. Patient states he is supposed to follow up with a GI doctor for an outpatient procedure (delayed due to cardiac clearance) but thinks he can not wait that long due to his swallowing difficulties. Please advise patient. Copied from CRM [id number]. Topic: Clinical - Red Word Triage >> Jun 28, 2023  1:01 PM Chris [name] wrote: Red Word that prompted transfer to Nurse Triage: hard time swallowing, no food/meds are going down properly. Reason for Disposition  SEVERE difficulty swallowing (e.g., drooling or spitting, can't swallow water)  Answer Assessment - Initial Assessment Questions 1. DESCRIPTION: "Tell me more about this problem." "Are you  having trouble swallowing liquids, solids, or both?" "Any  trouble with swallowing saliva (spit)?"     Patient states he tried to eat a scrambled egg this morning about 2 hours ago and since then even water won't go down. He states there is soreness in his stomach.  2. SEVERITY: "How bad is the swallowing difficulty?"  (e.g., Scale 1-10; or mild, moderate, severe)   - MILD (0-3): Occasional swallowing difficulty; has trouble swallowing certain types of foods or liquids.   - MODERATE (4-7): Frequent swallowing difficulty; only able to swallow small amounts of foods and fluids.   - SEVERE (8-10): Unable to swallow any foods, fluids, or saliva; sensation of "lump in throat" or "something stuck in throat", and frequent drooling or spitting may be present.     Patient states it feels like everything is stomach in his stomach. Patient states he is unable to swallow even water.  3. ONSET: "When did the swallowing problems begin?"      Patient states yesterday it was painful to swallow Boost and states eventually it went down.  4. CAUSE: "What do you think is causing the problem?"  (e.g., dry mouth, food or pill stuck in throat, mouth pain, sore throat, progression of disease process such as dementia or Parkinson's disease).      Hiatal hernia.  5. CHRONIC or RECURRENT: "Is this a new problem for you?"  If No, ask: "How long have you had this problem?" (e.g., days, weeks, months)      Patient states this has been a chronic problem for him due to his hiatal hernia.  6. OTHER SYMPTOMS: "Do you have any other symptoms?" (e.g., chest pain, difficulty breathing, mouth sores, sore throat, swollen tongue, chest pain)     Vomited  yesterday morning, cough since this morning.  Protocols used: Swallowing Difficulty-A-AH

## 2023-06-28 NOTE — Telephone Encounter (Signed)
Called patient and he states he will follow up with His GI doctor at [facility]. He then asked [place] when we could see him and I told him April but I would put him on the Wait list and if anything comes up sooner we would call him. He states he will die if he had to wait that long. He will call his other GI doctor. He states this morning he went to [employer] and ate a half of a egg. He states now it is stuck in his throat. He states he feels like he needs to go back to the ER. Informed him if he can not get it down then he should go back to the ER. He states he will see he going to see if he can drink some water to get it down first. Informed him to call his doctor at [facility] to make a follow up appointment with them

## 2023-06-28 NOTE — Telephone Encounter (Signed)
Chris Reil, MD  [name], CMA Looks like this patient does not have an established GI, he needs a new appointment, next available but keep him on a priority list Large hiatal hernia, iron deficiency anemia   Tried to call patient but mail box is full unable to leave a voicemail. Patient did see [name] 2 years ago on 06/13/2021 so is technically still a establish patient with them. Do you want to see the patient or do you want them to follow up with [facility]

## 2023-06-29 ENCOUNTER — Telehealth: Payer: Self-pay

## 2023-06-29 ENCOUNTER — Telehealth: Payer: Self-pay | Admitting: Cardiology

## 2023-06-29 DIAGNOSIS — K449 Diaphragmatic hernia without obstruction or gangrene: Secondary | ICD-10-CM

## 2023-06-29 NOTE — Telephone Encounter (Signed)
New Message:.      Patient said Chris [name] was suppose to be referring him to a surgeon, so he could have his Hernia removed. He said he would like for her to refer him to Dr Hyman Hopes [place] please

## 2023-06-29 NOTE — Telephone Encounter (Signed)
Referral sent to surgeon provided.   Thanks!

## 2023-06-29 NOTE — Telephone Encounter (Signed)
That is fine to go ahead and send the referral to the provider of his choice.

## 2023-06-29 NOTE — Telephone Encounter (Signed)
Will route to NP- I didn't see anything in the note and wanted to check. Okay to place referral?   Thanks!

## 2023-06-29 NOTE — Progress Notes (Signed)
Blood counts and kidney function have remained stable. Pre-procedure labs.

## 2023-06-29 NOTE — Telephone Encounter (Signed)
Called patient. Advised that referral was sent.   Patient verbalized understanding.

## 2023-06-29 NOTE — Transitions of Care (Post Inpatient/ED Visit) (Signed)
   06/29/2023  Name: Chris [name]. MRN: [id number] DOB: February 05, 1941  Today's TOC FU Call Status: Today's TOC FU Call Status:: Unsuccessful Call (1st Attempt) Unsuccessful Call (1st Attempt) Date: 06/29/23  Attempted to reach the patient regarding the most recent Inpatient/ED visit.  Follow Up Plan: Additional outreach attempts will be made to reach the patient to complete the Transitions of Care (Post Inpatient/ED visit) call.   Yasmyn Bellisario, CMA  [facility] Team Direct Dial: [phone]

## 2023-06-30 ENCOUNTER — Encounter: Payer: Self-pay | Admitting: *Deleted

## 2023-06-30 NOTE — Transitions of Care (Post Inpatient/ED Visit) (Signed)
 06/30/2023  Name: Chris [name]. MRN: [id number] DOB: August 07, 1940  Today's TOC FU Call Status: Today's TOC FU Call Status:: Successful TOC FU Call Completed Unsuccessful Call (1st Attempt) Date: 06/29/23 [facility] FU Call Complete Date: 06/30/23 Patient's Name and Date of Birth confirmed.  Transition Care Management Follow-up Telephone Call Date of Discharge: 06/25/23 Discharge Facility: [facility]) Type of Discharge: Emergency Department Reason for ED Visit: Other: (abdominal pain) How have you been since you were released from the hospital?: Same (still having pain.) Any questions or concerns?: No  Items Reviewed: Medications obtained,verified, and reconciled?: Yes (Medications Reviewed) Any new allergies since your discharge?: No Dietary orders reviewed?: NA Do you have support at home?: No (wife passed last week. no longer has help at home.)  Medications Reviewed Today: Medications Reviewed Today     Reviewed by Chris [name], Chris [name], CMA (Certified Medical Assistant) on 06/30/23 at 1337  Med List Status: <None>   Medication Order Taking? Sig Documenting Provider Last Dose Status Informant  acetaminophen  (TYLENOL ) 500 MG tablet [id number] No Take 500 mg by mouth every 6 (six) hours as needed for mild pain. [provider] Taking Active Self  aspirin  EC 81 MG tablet [id number] No Take 1 tablet (81 mg total) by mouth daily. Chris Heinz, MD 06/24/2023 Active Self  bisacodyl  (DULCOLAX) 5 MG EC tablet [id number] No Take 1 tablet (5 mg total) by mouth at bedtime as needed for moderate constipation.  Patient not taking: Reported on 06/29/2023   Chris Bellis, MD Not Taking Active Self  Blood Glucose Monitoring Suppl DEVI [id number] No 1 each by Does not apply route in the morning, at noon, and at bedtime. May substitute to any manufacturer covered by patient's insurance. Chris [name] DASEN, MD Taking Active Self  clopidogrel  (PLAVIX ) 75 MG tablet [id number] No  TAKE 1 TABLET EVERY DAY Chris [name] DASEN, MD 06/24/2023 Active Self           Med Note Chris [name]   Tue Jun 29, 2023 12:53 PM) Med ON HOLD for procedure 07/04/22  cyanocobalamin  (VITAMIN B12) 1000 MCG tablet [id number] No Take 1 tablet (1,000 mcg total) by mouth daily.  Patient not taking: Reported on 06/29/2023   Chris Bellis, MD Not Taking Active Self  furosemide  (LASIX ) 20 MG tablet [id number] No Take 1 tablet (20 mg total) by mouth daily. Chris [name] DASEN, MD 06/24/2023 Active Self  HYDROcodone -acetaminophen  (NORCO ) 5-325 MG tablet [id number]  Take 1 tablet by mouth every 6 (six) hours as needed for up to 5 days for moderate pain (pain score 4-6). Chris Slate, MD  Active Self           Med Note Chris [name]   Tue Jun 29, 2023  1:00 PM) Pt has med but has not needed to take   iron  polysaccharides (NIFEREX) 150 MG capsule [id number] No Take 1 capsule (150 mg total) by mouth daily.  Patient not taking: Reported on 06/29/2023   Chris Bellis, MD Not Taking Active Self  nitroGLYCERIN  (NITROSTAT ) 0.4 MG SL tablet [id number] No Place 1 tablet (0.4 mg total) under the tongue every 5 (five) minutes as needed for chest pain. Chris [name] DASEN, MD Taking Active Self  ondansetron  (ZOFRAN ) 4 MG tablet [id number]  Take 1 tablet (4 mg total) by mouth every 6 (six) hours as needed for up to 7 days for nausea or vomiting. Chris Slate, MD  Active Self           Med  Note Chris [name]   Tue Jun 29, 2023 12:59 PM) Pt has med but has not needed to take   pantoprazole  (PROTONIX ) 40 MG tablet [id number] No Take 1 tablet (40 mg total) by mouth daily. Chris [name] DASEN, MD 06/24/2023 Active Self  rosuvastatin  (CRESTOR ) 40 MG tablet [id number] No TAKE 1 TABLET EVERY DAY [name], Jay, MD 06/24/2023 Active Self  sucralfate  (CARAFATE ) 1 g tablet [id number] No TAKE 1 TABLET BY MOUTH FOUR TIMES DAILY WITH MEALS AND AT BEDTIME  Patient not taking: Reported on 06/29/2023   Chris [name] DASEN, MD Not Taking Active Self   traZODone  (DESYREL ) 50 MG tablet [id number] No Take 0.5 tablets (25 mg total) by mouth at bedtime as needed for sleep.  Patient not taking: Reported on 06/29/2023   Chris Chris RAMAN, FNP Not Taking Active Self            Home Care and Equipment/Supplies: Were Home Health Services Ordered?: NA Any new equipment or medical supplies ordered?: NA  Functional Questionnaire: Do you need assistance with bathing/showering or dressing?: No Do you need assistance with meal preparation?: No Do you need assistance with eating?: No Do you have difficulty maintaining continence: No Do you need assistance with getting out of bed/getting out of a chair/moving?: No Do you have difficulty managing or taking your medications?: No  Follow up appointments reviewed: PCP Follow-up appointment confirmed?: No (patient declined to schedule follow up due to numerous upcoming visits with specialist) MD Provider Line Number:[phone] Given: Yes Specialist Hospital Follow-up appointment confirmed?: NA Do you need transportation to your follow-up appointment?: No Do you understand care options if your condition(s) worsen?: Yes-patient verbalized understanding  Per patient: he is having a heart cath Monday with Dr. Ladona and if all goes accordingly with that he will have surgery for hernia repair. Declined to schedule follow up at this time.  Chris [name], CMA  [facility] AWV Team Direct Dial: [phone]

## 2023-07-01 ENCOUNTER — Telehealth: Payer: Self-pay | Admitting: *Deleted

## 2023-07-01 NOTE — Telephone Encounter (Signed)
 Call placed to patient to review procedure instructions, mailbox full, unable to leave message.

## 2023-07-01 NOTE — Telephone Encounter (Signed)
 Cardiac Catheterization scheduled at [facility] for: Monday July 05, 2023 10:30 AM Arrival time [facility] Main Entrance A at: 8:30 AM  Nothing to eat after midnight prior to procedure, clear liquids until 5 AM day of procedure.  Medication instructions: -Hold:  Lasix -day before and day of procedure-per protocol GFR < 60 (54) -Other usual morning medications can be taken with sips of water including aspirin  81 mg and Plavix  75 mg  Plan to go home the same day, you will only stay overnight if medically necessary.  You must have responsible adult to drive you home.  Someone must be with you the first 24 hours after you arrive home.  Call placed to patient to review procedure instructions, no answer, unable to leave message, mailbox full.

## 2023-07-05 ENCOUNTER — Ambulatory Visit (HOSPITAL_COMMUNITY)
Admission: RE | Admit: 2023-07-05 | Discharge: 2023-07-05 | Disposition: A | Discharge status: Home / Self Care | Payer: Medicare HMO | Attending: Cardiology | Admitting: Cardiology

## 2023-07-05 ENCOUNTER — Other Ambulatory Visit: Payer: Self-pay

## 2023-07-05 ENCOUNTER — Encounter (HOSPITAL_COMMUNITY): Payer: Self-pay | Admitting: Cardiology

## 2023-07-05 ENCOUNTER — Encounter (HOSPITAL_COMMUNITY)
Admission: RE | Disposition: A | Discharge status: Home / Self Care | Payer: Medicare HMO | Source: Home / Self Care | Attending: Cardiology

## 2023-07-05 DIAGNOSIS — I130 Hypertensive heart and chronic kidney disease with heart failure and stage 1 through stage 4 chronic kidney disease, or unspecified chronic kidney disease: Secondary | ICD-10-CM | POA: Insufficient documentation

## 2023-07-05 DIAGNOSIS — Z7984 Long term (current) use of oral hypoglycemic drugs: Secondary | ICD-10-CM | POA: Insufficient documentation

## 2023-07-05 DIAGNOSIS — Z8673 Personal history of transient ischemic attack (TIA), and cerebral infarction without residual deficits: Secondary | ICD-10-CM | POA: Insufficient documentation

## 2023-07-05 DIAGNOSIS — Z6833 Body mass index (BMI) 33.0-33.9, adult: Secondary | ICD-10-CM | POA: Insufficient documentation

## 2023-07-05 DIAGNOSIS — Z951 Presence of aortocoronary bypass graft: Secondary | ICD-10-CM | POA: Diagnosis not present

## 2023-07-05 DIAGNOSIS — Z7902 Long term (current) use of antithrombotics/antiplatelets: Secondary | ICD-10-CM | POA: Diagnosis not present

## 2023-07-05 DIAGNOSIS — I6523 Occlusion and stenosis of bilateral carotid arteries: Secondary | ICD-10-CM | POA: Diagnosis not present

## 2023-07-05 DIAGNOSIS — Z7982 Long term (current) use of aspirin: Secondary | ICD-10-CM | POA: Insufficient documentation

## 2023-07-05 DIAGNOSIS — E782 Mixed hyperlipidemia: Secondary | ICD-10-CM | POA: Insufficient documentation

## 2023-07-05 DIAGNOSIS — Z955 Presence of coronary angioplasty implant and graft: Secondary | ICD-10-CM | POA: Diagnosis not present

## 2023-07-05 DIAGNOSIS — I252 Old myocardial infarction: Secondary | ICD-10-CM | POA: Diagnosis not present

## 2023-07-05 DIAGNOSIS — I2582 Chronic total occlusion of coronary artery: Secondary | ICD-10-CM | POA: Diagnosis not present

## 2023-07-05 DIAGNOSIS — E1122 Type 2 diabetes mellitus with diabetic chronic kidney disease: Secondary | ICD-10-CM | POA: Diagnosis not present

## 2023-07-05 DIAGNOSIS — I25118 Atherosclerotic heart disease of native coronary artery with other forms of angina pectoris: Secondary | ICD-10-CM | POA: Insufficient documentation

## 2023-07-05 DIAGNOSIS — E669 Obesity, unspecified: Secondary | ICD-10-CM | POA: Insufficient documentation

## 2023-07-05 DIAGNOSIS — I5042 Chronic combined systolic (congestive) and diastolic (congestive) heart failure: Secondary | ICD-10-CM | POA: Insufficient documentation

## 2023-07-05 DIAGNOSIS — N1832 Chronic kidney disease, stage 3b: Secondary | ICD-10-CM | POA: Diagnosis not present

## 2023-07-05 DIAGNOSIS — I2510 Atherosclerotic heart disease of native coronary artery without angina pectoris: Secondary | ICD-10-CM

## 2023-07-05 DIAGNOSIS — Z79899 Other long term (current) drug therapy: Secondary | ICD-10-CM | POA: Diagnosis not present

## 2023-07-05 HISTORY — PX: RIGHT/LEFT HEART CATH AND CORONARY/GRAFT ANGIOGRAPHY: CATH118267

## 2023-07-05 LAB — POCT I-STAT EG7
Acid-Base Excess: 0 mmol/L (ref 0.0–2.0)
Acid-Base Excess: 1 mmol/L (ref 0.0–2.0)
Bicarbonate: 25.3 mmol/L (ref 20.0–28.0)
Bicarbonate: 26.1 mmol/L (ref 20.0–28.0)
Calcium, Ion: 1.22 mmol/L (ref 1.15–1.40)
Calcium, Ion: 1.23 mmol/L (ref 1.15–1.40)
HCT: 34 % — ABNORMAL LOW (ref 39.0–52.0)
HCT: 33 % — ABNORMAL LOW (ref 39.0–52.0)
Hemoglobin: 11.6 g/dL — ABNORMAL LOW (ref 13.0–17.0)
Hemoglobin: 11.2 g/dL — ABNORMAL LOW (ref 13.0–17.0)
O2 Saturation: 56 %
O2 Saturation: 55 %
Potassium: 3.9 mmol/L (ref 3.5–5.1)
Potassium: 4 mmol/L (ref 3.5–5.1)
Sodium: 142 mmol/L (ref 135–145)
Sodium: 141 mmol/L (ref 135–145)
TCO2: 27 mmol/L (ref 22–32)
TCO2: 27 mmol/L (ref 22–32)
pCO2, Ven: 43.8 mm[Hg] — ABNORMAL LOW (ref 44–60)
pCO2, Ven: 44.4 mmHg (ref 44–60)
pH, Ven: 7.369 (ref 7.25–7.43)
pH, Ven: 7.377 (ref 7.25–7.43)
pO2, Ven: 31 mm[Hg] — CL (ref 32–45)
pO2, Ven: 30 mmHg — CL (ref 32–45)

## 2023-07-05 LAB — GLUCOSE, CAPILLARY: Glucose-Capillary: 126 mg/dL — ABNORMAL HIGH (ref 70–99)

## 2023-07-05 SURGERY — RIGHT/LEFT HEART CATH AND CORONARY/GRAFT ANGIOGRAPHY
Anesthesia: LOCAL

## 2023-07-05 MED ORDER — IOHEXOL 350 MG/ML SOLN
INTRAVENOUS | Status: DC | PRN
Start: 2023-07-05 — End: 2023-07-05
  Administered 2023-07-05: 45 mL

## 2023-07-05 MED ORDER — LIDOCAINE HCL (PF) 1 % IJ SOLN
INTRAMUSCULAR | Status: AC
  Filled 2023-07-05: qty 30

## 2023-07-05 MED ORDER — SODIUM CHLORIDE 0.9% FLUSH: 3.0000 mL | INTRAVENOUS | Status: DC | PRN

## 2023-07-05 MED ORDER — MIDAZOLAM HCL 2 MG/2ML IJ SOLN
INTRAMUSCULAR | Status: DC | PRN
  Administered 2023-07-05: 2 mg via INTRAVENOUS

## 2023-07-05 MED ORDER — HEPARIN (PORCINE) IN NACL 1000-0.9 UT/500ML-% IV SOLN
INTRAVENOUS | Status: DC | PRN
  Administered 2023-07-05: 1000 mL

## 2023-07-05 MED ORDER — ASPIRIN 81 MG PO CHEW
81.0000 mg | CHEWABLE_TABLET | ORAL | Status: AC
  Administered 2023-07-05: 81 mg via ORAL
  Filled 2023-07-05: qty 1

## 2023-07-05 MED ORDER — FENTANYL CITRATE (PF) 100 MCG/2ML IJ SOLN
INTRAMUSCULAR | Status: AC
  Filled 2023-07-05: qty 2

## 2023-07-05 MED ORDER — HEPARIN SODIUM (PORCINE) 1000 UNIT/ML IJ SOLN
INTRAMUSCULAR | Status: AC
  Filled 2023-07-05: qty 10

## 2023-07-05 MED ORDER — FENTANYL CITRATE (PF) 100 MCG/2ML IJ SOLN
INTRAMUSCULAR | Status: DC | PRN
  Administered 2023-07-05: 25 ug via INTRAVENOUS

## 2023-07-05 MED ORDER — ONDANSETRON HCL 4 MG/2ML IJ SOLN: 4.0000 mg | Freq: Four times a day (QID) | INTRAMUSCULAR | Status: DC | PRN

## 2023-07-05 MED ORDER — MIDAZOLAM HCL 2 MG/2ML IJ SOLN
INTRAMUSCULAR | Status: AC
  Filled 2023-07-05: qty 2

## 2023-07-05 MED ORDER — SODIUM CHLORIDE 0.9 % IV SOLN: INTRAVENOUS | Status: DC

## 2023-07-05 MED ORDER — HEPARIN SODIUM (PORCINE) 1000 UNIT/ML IJ SOLN
INTRAMUSCULAR | Status: DC | PRN
  Administered 2023-07-05: 5000 [IU] via INTRAVENOUS

## 2023-07-05 MED ORDER — ACETAMINOPHEN 325 MG PO TABS: 650.0000 mg | ORAL_TABLET | ORAL | Status: DC | PRN

## 2023-07-05 MED ORDER — LIDOCAINE HCL (PF) 1 % IJ SOLN
INTRAMUSCULAR | Status: DC | PRN
  Administered 2023-07-05 (×2): 2 mL

## 2023-07-05 MED ORDER — VERAPAMIL HCL 2.5 MG/ML IV SOLN
INTRAVENOUS | Status: DC | PRN
  Administered 2023-07-05: 10 mL via INTRA_ARTERIAL

## 2023-07-05 MED ORDER — SODIUM CHLORIDE 0.9 % IV SOLN: 250.0000 mL | INTRAVENOUS | Status: DC | PRN

## 2023-07-05 MED ORDER — VERAPAMIL HCL 2.5 MG/ML IV SOLN
INTRAVENOUS | Status: AC
Start: 2023-07-05 — End: ?
  Filled 2023-07-05: qty 2

## 2023-07-05 SURGICAL SUPPLY — 14 items
CATH BALLN WEDGE 5F 110CM (CATHETERS) IMPLANT
CATH INFINITI 5 FR IM (CATHETERS) IMPLANT
CATH INFINITI 5 FR LCB (CATHETERS) IMPLANT
CATH INFINITI 5FR MULTPACK ANG (CATHETERS) IMPLANT
DEVICE RAD COMP TR BAND LRG (VASCULAR PRODUCTS) IMPLANT
GLIDESHEATH SLEND A-KIT 6F 22G (SHEATH) IMPLANT
GUIDEWIRE INQWIRE 1.5J.035X260 (WIRE) IMPLANT
INQWIRE 1.5J .035X260CM (WIRE) ×1 IMPLANT
PACK CARDIAC CATHETERIZATION (CUSTOM PROCEDURE TRAY) ×1 IMPLANT
PROTECTION STATION PRESSURIZED (MISCELLANEOUS) ×1 IMPLANT
SHEATH GLIDE SLENDER 4/5FR (SHEATH) IMPLANT
SHEATH PROBE COVER 6X72 (BAG) IMPLANT
STATION PROTECTION PRESSURIZED (MISCELLANEOUS) IMPLANT
WIRE MICROINTRODUCER 60CM (WIRE) IMPLANT

## 2023-07-05 NOTE — Interval H&P Note (Signed)
 History and Physical Interval Note:  07/05/2023 2:55 PM  Chris [name].  has presented today for surgery, with the diagnosis of reduced ef.  The various methods of treatment have been discussed with the patient and family. After consideration of risks, benefits and other options for treatment, the patient has consented to  Procedure(s): RIGHT/LEFT HEART CATH AND CORONARY/GRAFT ANGIOGRAPHY (N/A) and possible coronary angioplasty as a surgical intervention.  The patient's history has been reviewed, patient examined, no change in status, stable for surgery.  I have reviewed the patient's chart and labs.  Questions were answered to the patient's satisfaction.     [name]

## 2023-07-05 NOTE — Interval H&P Note (Signed)
 History and Physical Interval Note:  07/05/2023 1:33 PM  Chris [name].  has presented today for surgery, with the diagnosis of reduced ef.  The various methods of treatment have been discussed with the patient and family. After consideration of risks, benefits and other options for treatment, the patient has consented to  Procedure(s): RIGHT/LEFT HEART CATH AND CORONARY/GRAFT ANGIOGRAPHY (N/A) and possible coronary intervention as a surgical intervention.  The patient's history has been reviewed, patient examined, no change in status, stable for surgery.  I have reviewed the patient's chart and labs.  Questions were answered to the patient's satisfaction.     [name]

## 2023-07-06 ENCOUNTER — Other Ambulatory Visit: Payer: Self-pay

## 2023-07-06 NOTE — Telephone Encounter (Signed)
R/LHC done 07/05/23

## 2023-07-09 ENCOUNTER — Other Ambulatory Visit: Payer: Self-pay

## 2023-07-19 ENCOUNTER — Encounter: Payer: Self-pay | Admitting: Cardiology

## 2023-07-19 ENCOUNTER — Ambulatory Visit: Payer: Medicare HMO | Attending: Cardiology | Admitting: Cardiology

## 2023-07-19 VITALS — BP 108/60 | HR 70 | Ht 72.0 in | Wt 212.4 lb

## 2023-07-19 DIAGNOSIS — E1122 Type 2 diabetes mellitus with diabetic chronic kidney disease: Secondary | ICD-10-CM

## 2023-07-19 DIAGNOSIS — I25110 Atherosclerotic heart disease of native coronary artery with unstable angina pectoris: Secondary | ICD-10-CM

## 2023-07-19 DIAGNOSIS — K449 Diaphragmatic hernia without obstruction or gangrene: Secondary | ICD-10-CM | POA: Diagnosis not present

## 2023-07-19 DIAGNOSIS — I5020 Unspecified systolic (congestive) heart failure: Secondary | ICD-10-CM | POA: Diagnosis not present

## 2023-07-19 DIAGNOSIS — I25118 Atherosclerotic heart disease of native coronary artery with other forms of angina pectoris: Secondary | ICD-10-CM

## 2023-07-19 DIAGNOSIS — N1832 Chronic kidney disease, stage 3b: Secondary | ICD-10-CM

## 2023-07-19 DIAGNOSIS — I951 Orthostatic hypotension: Secondary | ICD-10-CM | POA: Diagnosis not present

## 2023-07-19 DIAGNOSIS — R0609 Other forms of dyspnea: Secondary | ICD-10-CM

## 2023-07-19 DIAGNOSIS — E782 Mixed hyperlipidemia: Secondary | ICD-10-CM | POA: Diagnosis not present

## 2023-07-19 MED ORDER — CETIRIZINE HCL 10 MG PO TABS: 10.0000 mg | ORAL_TABLET | Freq: Every day | ORAL | 1 refills | Status: DC

## 2023-07-19 NOTE — Progress Notes (Signed)
 Cardiology Office Note:  .   Date:  07/19/2023  ID:  [name]., DOB 01/03/1941, MRN [id number] PCP: Donita Brooks, MD  [facility] Providers Cardiologist:  None    History of Present Illness: Quentin Ore Simington Montez Hageman. is a 83 y.o. male with a past medical history of hypertension, hyperlipidemia, type 2 diabetes, history of stroke and no residual deficits, carotid artery stenosis, PAF, coronary disease status post CABG with multiple subsequent PCI's who returns today for follow-up of his coronary artery disease.   Coronary artery disease with history of MI with CABG (1995) x3 vessels with LIMA to the LAD, SVG to RCA, SVG to OM, status post multiple coronary interventions.cardiac catheterization in 2000 with stent was placed x 3. Repeat left heart catheterization in 01/2014 with grafts without changes. He can underwent PCI to high-grade stenosis of the SVG to RCA in March 2022 and again in April 2023 needing aspiration thrombectomy followed by repeat stenting to the SVG to RCA who presented with an NSTEMI, EF had normalized from 35-40 to normal in May 2023. Carotid duplex completed 09/2021 revealed no significant change from the 2021 study with follow-up recommended in 6 months. He showed right ICA 50 to 69% and left ICA 1-15%. The study was completed in 04/24/2022 which revealed right ICA of 60-49%, and left ICA 60-49%, recommended repeat study yearly.    He was last seen in clinic 06/14/2023 continued to complain of shortness of breath even with a 7 pound weight loss since being restarted on furosemide.  He was scheduled for a left and right heart catheterization which revealed no significant progression of his coronary artery disease.  Aspirin was discontinued and he remains on clopidogrel.  He was evaluated in the [facility] emergency department 06/14/2023 with a retractable nausea and vomiting.  He was to follow-up with his PCP in 1 week continued on PPI therapy, started on iron  supplementation, repeat iron profile after 3 to 6 months.  He was also scheduled with GI and general surgery for hiatal hernia.  He returns to clinic today with continued complaints of shortness of breath.  He states he has been unable to eat solid foods without becoming show today believes that is related to the hiatal hernia that he has.  He states that he was recently evaluated in the emergency department and feels that his hernia is reducing his heart function.  He was recently scheduled for right and left heart catheterization due to reduced LVEF.  He states that he has been compliant with his current medication without any undue side effects.  He also states that his PCP recently discontinued his Jardiance due to dizziness and lightheadedness.  ROS: 10 point review of systems has been reviewed and considered negative with exception was been listed in the HPI  Studies Reviewed: Marland Kitchen   EKG Interpretation Date/Time:  Monday July 19 2023 15:09:31 EST Ventricular Rate:  70 PR Interval:  188 QRS Duration:  132 QT Interval:  458 QTC Calculation: 494 R Axis:   108  Text Interpretation: Normal sinus rhythm Right bundle branch block Cannot rule out Inferior infarct (cited on or before 04-Sep-2021) When compared with ECG of 24-Jun-2023 21:46, Confirmed by Charlsie Quest ([id number]) on 07/19/2023 3:10:37 PM    Left Heart Catheterization 07/05/23: Hemodynamic data: EDP 15 mmHg, no pressure gradient across the aortic valve. Right heart hemodynamics where altered due to technical issues, moderate pulmonary hypertension present, PA pressure 44/6, mean 19 mmHg. PW  22/20, mean 18 mmHg. CO 5.66, CI 2.56. PVR 3.52. QP/QS 1.00.   Angiographic data: LM: Large vessel, mild disease. LAD: Occluded in the midsegment.  Distal segment supplied by LIMA to LAD which is widely patent. LCx: Occluded in the proximal to mid segment.  OM1 is supplied by SVG to OM which is widely patent. RCA: Occluded in the midsegment  just proximal to previously placed stent.  SVG to RCA is patent, previously placed 3.5 x 38 mm resolute Onyx DES is widely patent in the distal RCA, proximal to mid 38 mm Taxus DES placed in May 2010 restented due to thrombotic lesion with a  4.0 x 12 mm resolute frontier DES placed 09/03/21 is widely patent      Impression and recommendations: No significant progression of coronary artery disease, no significant change in coronary anatomy since his latest stenting to his SVG to RCA on 09/11/2021.  Preserved cardiac output and cardiac index, mild pulm hypertension secondary to very mildly elevated EDP.  Discontinue aspirin, continue Plavix alone.   2D echo 05/11/2023 1. Left ventricular ejection fraction, by estimation, is 35 to 40%. Left  ventricular ejection fraction by PLAX is 47 %. The left ventricle has  moderately decreased function. The left ventricle demonstrates regional  wall motion abnormalities (mild global   hypokinesis with severe inferior wall hypokinesis). Left ventricular  diastolic parameters are consistent with Grade I diastolic dysfunction  (impaired relaxation).   2. Right ventricular systolic function is mild to moderately reduced. The  right ventricular size is mildly enlarged. Tricuspid regurgitation signal  is inadequate for assessing PA pressure.   3. The mitral valve is normal in structure. Mild mitral valve  regurgitation. No evidence of mitral stenosis.   4. The aortic valve is normal in structure. Aortic valve regurgitation is  not visualized. No aortic stenosis is present.   5. The inferior vena cava is normal in size with greater than 50%  respiratory variability, suggesting right atrial pressure of 3 mmHg.    Left Heart Catheterization 09/04/21:  LV: 121/10, EDP 26 mmHg.  Ao 118/62, mean 83 mmHg.  No pressure gradient across the aortic valve. LM: Large vessel, mild disease. LAD: Occluded in the midsegment.  Distal segment supplied by LIMA to LAD which is  widely patent. LCx: Occluded in the proximal to mid segment.  OM1 is supplied by SVG to OM which is widely patent. RCA: Occluded in the midsegment just proximal to previously placed stent.  SVG to RCA is patent, previously placed 3.5 x 38 mm resolute Onyx DES is widely patent in the distal RCA, proximal to mid 38 mm Taxus DES placed in May 2010 has a thrombotic 80% stenosis, focal.  There is mild luminal loss throughout the stent.   Intervention data: Successful filter wire protected aspiration thrombectomy followed by stenting of the proximal segment of the SVG to RCA with implantation of a 4.0 x 12 mm resolute frontier DES at 18 atmospheric pressure, post stenosis 0%.  TIMI-3 to TIMI-3  flow maintained.    Echocardiogram 10/15/2021:  Normal LV systolic function with visual EF 55-60%. Left ventricle cavity  is normal in size. Normal left ventricular wall thickness. Normal global  wall motion. Abnormal septal wall motion due to post-operative septum.  Normal diastolic filling pattern, normal LAP.  Pericardium is normal. Insignificant pericardial effusion. There is no hemodynamic significance.  Compared to 11/06/2020 LVEF 35-40% and RWMA have now improved, otherwise no significant change.    Carotid artery duplex 04/24/2022: Duplex  suggests stenosis in the right internal carotid artery (16-49%). Duplex suggests stenosis in the left internal carotid artery (16-49%). Compared to the study done on 10/15/2021, right ICA stenosis of >50% normal appears to have regressed. Follow up in one year is appropriate if clinically indicated. Risk Assessment/Calculations:             Physical Exam:   VS:  BP 108/60 (BP Location: Left Arm, Patient Position: Sitting, Cuff Size: Normal)   Pulse 70   Ht 6' (1.829 m)   Wt 212 lb 6 oz (96.3 kg)   SpO2 98%   BMI 28.80 kg/m    Wt Readings from Last 3 Encounters:  07/19/23 212 lb 6 oz (96.3 kg)  07/05/23 122 lb (55.3 kg)  06/25/23 218 lb (98.9 kg)    GEN:  Well nourished, well developed in no acute distress NECK: No JVD; No carotid bruits CARDIAC: RRR, II/VI systolic murmur RUSB without rubs or gallops RESPIRATORY:  Clear to auscultation without rales, wheezing or rhonchi  ABDOMEN: Soft, non-tender, non-distended EXTREMITIES:  No edema; No deformity   ASSESSMENT AND PLAN: .   Coronary artery disease of native coronary arteries with stable angina.  He denies any chest pain.  Recently underwent a right and left heart catheterization which showed no progression of his coronary artery disease but very mildly elevated LVEDP where he is continued on furosemide 20 mg daily.  He is continued on clopidogrel 75 mg daily with his aspirin recently discontinued by his primary cardiologist.  He is also continued on rosuvastatin 40 mg daily.  EKG today reveals sinus rhythm with right bundle branch block and previously cited inferior infarct with no acute change.  He has been sent for follow-up CBC and BMP today postprocedure.  Chronic HFrEF with recent drop in his LVEF to 35-40% with global hypokinesis.  Repeated right and left heart catheterization revealed stable coronary artery disease, moderate pulmonary hypertension with depression with PA pressure 44 over 6 cm 90 mmHg.  With his continued shortness of breath and dyspnea that has been worsening with his diuretic therapy he is being referred to advanced heart failure clinic as we are limited with dizziness and orthostasis is uptitrating GDMT.  He has previously tried to go off of his furosemide but had weight gain his primary care provider and recently discontinued his Jardiance due to dizziness.  He was not started on MRA therapy due to increased serum creatinine.  Mixed hyperlipidemia with his last panel from 2023.  States that he has an upcoming appointment with his PCP tomorrow will need updated lipid.  He has been continued on rosuvastatin 40 mg daily.  Goal of his LDL is less than 70 ideally 55.  Bilateral  carotid artery stenosis with recent carotid duplex in 2020 revealing right ICA stenosis of 16-49% stenosis in the left internal carotid.  16-49% compared to study 10/15/2021 with right ICA stenosis of greater than 50% appears to have regressed to follow-up in 1 year if appropriate and clinically indicated.  He is continued on clopidogrel and statin therapy.  Will continue with updated carotid duplex to be ordered at return appointment.  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  Unfortunately just recently came off of Jardiance and states he is no longer on any medications for his diabetes due to causing dizziness.  He has been scheduled for updated blood work today post procedure to reevaluate kidney function.  Ongoing  management with his diabetes by his PCP.  Orthostatic hypotension noted today with associated dizziness.  Patient is not on any antihypertensive medications and just recently had came off of Jardiance due to dizziness.  He has been encouraged to take his furosemide for shortness of breath, weight gain, or peripheral edema.  He has been sent for updated CBC to rule out anemia and a BMP to reevaluate electrolytes for dehydration.  With a hiatal hernia he has it has been difficult for him to increase his intake.  Large hiatal hernia that is limiting his diet and he is suffering from weight loss.  He has been referred to general surgeon Dr. Jessica Priest is in [place] per the patient's request.  As he was recommended to follow-up with surgery upon discharge from the hospital.        Dispo: Patient return to clinic to see MD/APP in 3 months or sooner if needed for reevaluation of symptoms  Signed, Wasyl Dornfeld, NP

## 2023-07-19 NOTE — Patient Instructions (Signed)
 Medication Instructions:  Start Zrytec 10 mg daily Continue all other medications *If you need a refill on your cardiac medications before your next appointment, please call your pharmacy*   Lab Work: Bmet,cbc today   Testing/Procedures: None ordered   Follow-Up: At [facility], you and your health needs are our priority.  As part of our continuing mission to provide you with exceptional heart care, we have created designated Provider Care Teams.  These Care Teams include your primary Cardiologist (physician) and Advanced Practice Providers (APPs -  Physician Assistants and Nurse Practitioners) who all work together to provide you with the care you need, when you need it.  We recommend signing up for the patient portal called "MyChart".  Sign up information is provided on this After Visit Summary.  MyChart is used to connect with patients for Virtual Visits (Telemedicine).  Patients are able to view lab/test results, encounter notes, upcoming appointments, etc.  Non-urgent messages can be sent to your provider as well.   To learn more about what you can do with MyChart, go to [email or web address].    Your next appointment:  3 months    Provider:  Frutoso Schatz NP       Schedule appointment with Heart Failure Clinic  [facility] Ramerez's office will call with appointment

## 2023-07-20 ENCOUNTER — Ambulatory Visit (INDEPENDENT_AMBULATORY_CARE_PROVIDER_SITE_OTHER): Payer: Medicare HMO | Admitting: Family Medicine

## 2023-07-20 ENCOUNTER — Encounter: Payer: Self-pay | Admitting: Family Medicine

## 2023-07-20 VITALS — BP 118/62 | HR 75 | Temp 97.9°F | Ht 72.0 in | Wt 212.0 lb

## 2023-07-20 DIAGNOSIS — I5042 Chronic combined systolic (congestive) and diastolic (congestive) heart failure: Secondary | ICD-10-CM | POA: Diagnosis not present

## 2023-07-20 DIAGNOSIS — J8410 Pulmonary fibrosis, unspecified: Secondary | ICD-10-CM | POA: Diagnosis not present

## 2023-07-20 DIAGNOSIS — E1159 Type 2 diabetes mellitus with other circulatory complications: Secondary | ICD-10-CM | POA: Diagnosis not present

## 2023-07-20 MED ORDER — VITAMIN B-12 1000 MCG PO TABS: 1000.0000 ug | ORAL_TABLET | Freq: Every day | ORAL | 3 refills | Status: AC

## 2023-07-20 NOTE — Progress Notes (Signed)
 Subjective:    Patient ID: Chris [name]., male    DOB: Sep 11, 1940, 83 y.o.   MRN: [id number]  Patient is an 83 year old Caucasian gentleman who presents with shortness of breath and dyspnea on exertion.  He recently had a catheterization of his heart due to the shortness of breath.  His bypass and stents were found to be widely patent with no flow-limiting lesions.  Patient had an echocardiogram in January that showed an ejection fraction of 35%.  In the past he has been on carvedilol, Jardiance, and Entresto.  However he discontinued the medicines due to dizziness.  He also has a history of interstitial lung disease.  In 2022 he saw pulmonology.  Their opinion at that time was that he had pulmonary fibrosis.  He was recommended to start antifibrotic therapy but he declined treatment at that point and was lost to follow-up.  He presents today complaining of dyspnea on exertion.  He reports cough productive of green mucus and morning.  Per his CT scan also she bronchiectasis in addition to his interstitial lung disease.  He is not currently taking any type of bronchodilator. Past Medical History:  Diagnosis Date   Abnormal exercise myocardial perfusion study    03/2018- 38% ef, see report   Arthritis    Chronic kidney disease    Congestive heart failure (CHF) (HCC) 09/2015   Coronary artery disease    DOE (dyspnea on exertion)    ED (erectile dysfunction)    GERD (gastroesophageal reflux disease)    occ   Gout    H/O hiatal hernia    Hyperlipidemia    Hypertension    Myocardial infarction (HCC)    Neuropathy    toes   Obesity    RBBB (right bundle branch block)    Stenosis of right internal carotid artery    Type II diabetes mellitus (HCC)    Umbilical hernia    a. s/p repair.   Vertigo     Past Surgical History:  Procedure Laterality Date   ANKLE ARTHROSCOPY WITH DRILLING/MICROFRACTURE Left 04/13/2013   Procedure: LEFT ANKLE ARTHROSCOPY WITH EXTENSIVE DEBRIEDMENT;  Surgeon:  Toni Arthurs, MD;  Location: [facility];  Service: Orthopedics;  Laterality: Left;   ANKLE SURGERY Left 08/18/2013   DR HEWITT   CARDIAC CATHETERIZATION  2000   stents x3   CARDIAC CATHETERIZATION  02/15/2014   Procedure: LEFT HEART CATH AND CORS/GRAFTS ANGIOGRAPHY;  Surgeon: Pamella Pert, MD;  Location: [facility] CATH LAB;  Service: Cardiovascular;;   COLONOSCOPY     CORONARY ARTERY BYPASS GRAFT  1995   LIMA to LAD, SVG to RCA, SVG to OM.    CORONARY STENT INTERVENTION N/A 08/12/2020   Procedure: CORONARY STENT INTERVENTION;  Surgeon: Elder Negus, MD;  Location: [facility] INVASIVE CV LAB;  Service: Cardiovascular;  Laterality: N/A;   CORONARY STENT INTERVENTION N/A 09/04/2021   Procedure: CORONARY STENT INTERVENTION;  Surgeon: Yates Decamp, MD;  Location: [facility] INVAS[facility] CV LAB;  Service: Cardiovascular;  Laterality: N/A;   LEFT HEART CATH AND CORS/GRAFTS ANGIOGRAPHY N/A 08/12/2020   Procedure: LEFT HEART CATH AND CORS/GRAFTS ANGIOGRAPHY;  Surgeon: Elder Negus, MD;  Location: [facility] CV [facility];  Service: Cardiovascular;  Laterality: N/A;   LEFT HEART CATH AND CORS/GRAFTS ANGIOGRAPHY N/A 09/04/2021   Procedure: LEFT HEART CATH AND CORS/GRAFTS ANGIOGRAPHY;  Surgeon: Yates Decamp, MD;  Location: [facility] CV LAB;  Service: Cardiovascular;  Laterality: N/A;   RIGHT/LEFT HEART CATH AND CORONARY/GRAFT ANGIOGRAPHY  N/A 07/05/2023   Procedure: RIGHT/LEFT HEART CATH AND CORONARY/GRAFT ANGIOGRAPHY;  Surgeon: Yates Decamp, MD;  Location: [facility];  Service: Cardiovascular;  Laterality: N/A;   TOTAL ANKLE ARTHROPLASTY Left 08/17/2013   Procedure: LEFT TOTAL ANKLE REPLACEMENT WITH POSSIBLE GASTROC RECESSION ;  Surgeon: Toni Arthurs, MD;  Location: [facility] OR;  Service: Orthopedics;  Laterality: Left;   UMBILICAL HERNIA REPAIR  12/2007   Current Outpatient Medications on File Prior to Visit  Medication Sig Dispense Refill   acetaminophen (TYLENOL) 500 MG tablet Take 500 mg by mouth every 6  (six) hours as needed for mild pain.     bisacodyl (DULCOLAX) 5 MG EC tablet Take 1 tablet (5 mg total) by mouth at bedtime as needed for moderate constipation. 100 tablet 0   clopidogrel (PLAVIX) 75 MG tablet TAKE 1 TABLET EVERY DAY 90 tablet 3   furosemide (LASIX) 20 MG tablet Take 1 tablet (20 mg total) by mouth daily. 30 tablet 0   nitroGLYCERIN (NITROSTAT) 0.4 MG SL tablet Place 1 tablet (0.4 mg total) under the tongue every 5 (five) minutes as needed for chest pain. 15 tablet 3   pantoprazole (PROTONIX) 40 MG tablet Take 1 tablet (40 mg total) by mouth daily. 90 tablet 1   rosuvastatin (CRESTOR) 40 MG tablet TAKE 1 TABLET EVERY DAY 90 tablet 3   Blood Glucose Monitoring Suppl DEVI 1 each by Does not apply route in the morning, at noon, and at bedtime. May substitute to any manufacturer covered by patient's insurance. (Patient not taking: Reported on 07/19/2023) 1 each 0   cetirizine (ZYRTEC ALLERGY) 10 MG tablet Take 1 tablet (10 mg total) by mouth daily. (Patient not taking: Reported on 07/20/2023) 90 tablet 1   iron polysaccharides (NIFEREX) 150 MG capsule Take 1 capsule (150 mg total) by mouth daily. (Patient not taking: Reported on 06/29/2023) 30 capsule 2   sucralfate (CARAFATE) 1 g tablet TAKE 1 TABLET BY MOUTH FOUR TIMES DAILY WITH MEALS AND AT BEDTIME (Patient not taking: Reported on 07/20/2023) 360 tablet 0   traZODone (DESYREL) 50 MG tablet Take 0.5 tablets (25 mg total) by mouth at bedtime as needed for sleep. (Patient not taking: Reported on 07/20/2023) 30 tablet 0   No current facility-administered medications on file prior to visit.   Allergies  Allergen Reactions   Brilinta [Ticagrelor] Shortness Of Breath   Alpha-Gal Hives   Crestor [Rosuvastatin Calcium] Other (See Comments)    Muscle pain- tolerating this 2022, however   Lipitor [Atorvastatin] Other (See Comments)    Intolerable muscle pain all over    Social History   Socioeconomic History   Marital status: Widowed     Spouse name: Not on file   Number of children: 4   Years of education: Not on file   Highest education level: Not on file  Occupational History   Not on file  Tobacco Use   Smoking status: Former    Current packs/day: 0.00    Average packs/day: 1 pack/day for 9.0 years (9.0 ttl pk-yrs)    Types: Cigarettes    Start date: 10/30/1961    Quit date: 10/31/1970    Years since quitting: 52.7   Smokeless tobacco: Former    Types: Chew   Tobacco comments:    chewed for 2 years in his 20's  Vaping Use   Vaping status: Never Used  Substance and Sexual Activity   Alcohol use: Not Currently    Comment: occ wine last 6 months  Drug use: No   Sexual activity: Yes  Other Topics Concern   Not on file  Social History Narrative   Lives in [place] with wife.  Retired.   Social Drivers of Corporate investment banker Strain: Low Risk  (05/14/2022)   Overall Financial Resource Strain (CARDIA)    Difficulty of Paying Living Expenses: Not hard at all  Food Insecurity: No Food Insecurity (06/25/2023)   Hunger Vital Sign    Worried About Running Out of Food in the Last Year: Never true    Ran Out of Food in the Last Year: Never true  Transportation Needs: No Transportation Needs (06/25/2023)   PRAPARE - Administrator, Civil Service (Medical): No    Lack of Transportation (Non-Medical): No  Physical Activity: Insufficiently Active (05/14/2022)   Exercise Vital Sign    Days of Exercise per Week: 3 days    Minutes of Exercise per Session: 30 min  Stress: No Stress Concern Present (05/14/2022)   [employer] of Occupational Health - Occupational Stress Questionnaire    Feeling of Stress : Only a little  Social Connections: Moderately Integrated (06/25/2023)   Social Connection and Isolation Panel [NHANES]    Frequency of Communication with Friends and Family: More than three times a week    Frequency of Social Gatherings with Friends and Family: More than three times a week     Attends Religious Services: More than 4 times per year    Active Member of [employer] or Organizations: Yes    Attends Banker Meetings: More than 4 times per year    Marital Status: Widowed  Intimate Partner Violence: Not At Risk (06/25/2023)   Humiliation, Afraid, Rape, and Kick questionnaire    Fear of Current or Ex-Partner: No    Emotionally Abused: No    Physically Abused: No    Sexually Abused: No     Review of Systems  All other systems reviewed and are negative.      Objective:   Physical Exam Vitals reviewed.  Constitutional:      General: He is not in acute distress.    Appearance: He is obese. He is not ill-appearing or toxic-appearing.  Cardiovascular:     Rate and Rhythm: Normal rate and regular rhythm.     Heart sounds: No murmur heard.    No friction rub. No gallop.  Pulmonary:     Effort: Pulmonary effort is normal. No respiratory distress.     Breath sounds: No stridor. No wheezing, rhonchi or rales.  Abdominal:     General: Abdomen is flat. Bowel sounds are normal. There is no distension.     Palpations: Abdomen is soft.     Tenderness: There is no abdominal tenderness. There is no guarding.  Musculoskeletal:     Right lower leg: No edema.     Left lower leg: No edema.  Skin:    Findings: No erythema or rash.  Neurological:     Mental Status: He is alert.           Assessment & Plan:  Type 2 diabetes mellitus with other circulatory complication, without long-term current use of insulin (HCC) - Plan: COMPLETE METABOLIC PANEL WITH GFR  Chronic combined systolic and diastolic CHF (congestive heart failure) (HCC)  Pulmonary fibrosis (HCC) Recent hemoglobin A1c was acceptable at 6.8.  This was checked in January.  Therefore his diabetes is adequately controlled with diet.  I believe his dyspnea is multifactorial and related to  congestive heart failure as well as pulmonary fibrosis.  He has an appointment to meet with a cardiologist who  specializes in congestive heart failure in March.  I encouraged him to reconsider the medications that he stopped in the past.  He would like to discuss with them the first.  Prior to starting stress I would like check his renal function given his recent catheterization and his history of chronic kidney disease.  If his potassium and renal function will allow I would suggest trying Entresto.  He also has pulmonary fibrosis and therefore I will be consult pulmonology to see if he is a candidate for antifibrotic therapy

## 2023-07-21 ENCOUNTER — Telehealth: Payer: Self-pay | Admitting: Family Medicine

## 2023-07-21 LAB — COMPLETE METABOLIC PANEL WITH GFR
AG Ratio: 1 (calc) (ref 1.0–2.5)
ALT: 7 U/L — ABNORMAL LOW (ref 9–46)
AST: 17 U/L (ref 10–35)
Albumin: 3.9 g/dL (ref 3.6–5.1)
Alkaline phosphatase (APISO): 54 U/L (ref 35–144)
BUN/Creatinine Ratio: 8 (calc) (ref 6–22)
BUN: 10 mg/dL (ref 7–25)
CO2: 27 mmol/L (ref 20–32)
Calcium: 9.4 mg/dL (ref 8.6–10.3)
Chloride: 103 mmol/L (ref 98–110)
Creat: 1.24 mg/dL — ABNORMAL HIGH (ref 0.70–1.22)
Globulin: 3.8 g/dL — ABNORMAL HIGH (ref 1.9–3.7)
Glucose, Bld: 90 mg/dL (ref 65–99)
Potassium: 4.3 mmol/L (ref 3.5–5.3)
Sodium: 138 mmol/L (ref 135–146)
Total Bilirubin: 0.4 mg/dL (ref 0.2–1.2)
Total Protein: 7.7 g/dL (ref 6.1–8.1)
eGFR: 59 mL/min/{1.73_m2} — ABNORMAL LOW (ref >=60)

## 2023-07-21 NOTE — Telephone Encounter (Signed)
 Copied from CRM [id number]. Topic: Clinical - Medication Question >> Jul 20, 2023  4:45 PM Alvino Blood C wrote: Reason for CRM: Patient wants Dr. Tanya Nones to know that he is interested in starting the jardiance.

## 2023-07-22 ENCOUNTER — Other Ambulatory Visit: Payer: Self-pay | Admitting: Family Medicine

## 2023-07-22 MED ORDER — EMPAGLIFLOZIN 10 MG PO TABS: 10.0000 mg | ORAL_TABLET | Freq: Every day | ORAL | 3 refills | Status: DC

## 2023-07-27 ENCOUNTER — Telehealth: Payer: Self-pay | Admitting: Family

## 2023-07-27 NOTE — Telephone Encounter (Signed)
 Pt confirmed appt for 07/28/23

## 2023-07-27 NOTE — Progress Notes (Unsigned)
 Advanced [facility] Failure Clinic Note    Referring Physician: Charlsie Quest, NP PCP: Donita Brooks, MD (last seen 02/25) Cardiologist: Charlsie Quest, NP (last seen 02/25)  Chief Complaint: shortness of breath  HPI:  Chris [name] is a 83 y/o male kindly referral by Charlsie Quest, NP for evaluation of his heart failure treatment being limited by dizziness/ orthostasis. He has a history of hypertension, hyperlipidemia, type 2 diabetes, history of stroke with no residual deficits, carotid artery stenosis, PAF, coronary disease status post CABG with multiple subsequent PCI's, hiatal hernia and chronic heart failure. Coronary artery disease with history of MI with CABG (1995) x3 vessels with LIMA to the LAD, SVG to RCA, SVG to OM, status post multiple coronary interventions.cardiac catheterization in 2000 with stent was placed x 3. Repeat left heart catheterization in 01/2014 with grafts without changes. He underwent PCI to high-grade stenosis of the SVG to RCA in March 2022 and again in April 2023 needing aspiration thrombectomy followed by repeat stenting to the SVG to RCA who presented with an NSTEMI, EF had normalized from 35-40 to normal in May 2023. Carotid duplex completed 09/2021 revealed no significant change from the 2021 study with follow-up recommended in 6 months. He showed right ICA 50 to 69% and left ICA 1-15%. The study was completed in 04/24/2022 which revealed right ICA of 60-49%, and left ICA 60-49%, recommended repeat study yearly.   He was evaluated in the [facility] emergency department 06/24/2023 with a retractable nausea and vomiting. He was to follow-up with his PCP in 1 week continued on PPI therapy, started on iron supplementation, repeat iron profile after 3 to 6 months. He was also scheduled with GI and general surgery for hiatal hernia.   R/LHC 07/05/23: No significant progression of coronary artery disease, no significant change in coronary anatomy since his latest stenting to his SVG to  RCA on 09/11/2021. Preserved cardiac output and cardiac index, mild pulm hypertension secondary to very mildly elevated EDP. Discontinue aspirin, continue Plavix alone.  He presents today for an initial HF clinic visit with a chief complaint of moderate shortness of breath with minimal exertion. Walks ~ 10 feet and will get short of breath. Has associated fatigue, chest pain due to hiatal hernia and occasional dizziness along with this. Denies palpitations, cough, abdominal distention, pedal edema or difficulty sleeping.   Says that his wife died ~ 1 month ago. Has difficulty eating because of a large hiatal hernia present. A lot of food he can't eat because he feels like it gets stuck. He feels like his hiatal hernia is what is contributing to his shortness of breath because of its size. He says that he also doesn't drink much fluids and says that he drinks ~ 1 bottle of water and 16 oz gatorade daily.   Has tried taking jardiance & entresto in the past but felt dizzy although he's not sure if it really was the medication that caused the dizziness or not.    Review of Systems: [y] = yes, [ ]  = no   General: Weight gain [ ] ; Weight loss [ ] ; Anorexia [ ] ; Fatigue Cove.Etienne ]; Fever [ ] ; Chills [ ] ; Weakness [ ]   Cardiac: Chest pain/pressure Cove.Etienne ]; Resting SOB [ ] ; Exertional SOB [ y]; Orthopnea [ ] ; Pedal Edema [ ] ; Palpitations [ ] ; Syncope [ ] ; Presyncope [ ] ; Paroxysmal nocturnal dyspnea[ ]   Pulmonary: Cough [ ] ; Wheezing[ ] ; Hemoptysis[ ] ; Sputum [ ] ; Snoring [ ]   GI: Vomiting[ ] ;  Dysphagia[ ] ; Melena[ ] ; Hematochezia [ ] ; Heartburn[ ] ; Abdominal pain [ ] ; Constipation [ ] ; Diarrhea [ ] ; BRBPR [ ]   GU: Hematuria[ ] ; Dysuria [ ] ; Nocturia[ ]   Vascular: Pain in legs with walking [ ] ; Pain in feet with lying flat [ ] ; Non-healing sores [ ] ; Stroke [ ] ; TIA [ ] ; Slurred speech [ ] ;  Neuro: Dizziness[ y]; Vertigo[ ] ; Seizures[ ] ; Paresthesias[ ] ;Blurred vision [ ] ; Diplopia [ ] ; Vision changes [ ]    Ortho/Skin: Arthritis [ ] ; Joint pain [ ] ; Muscle pain [ ] ; Joint swelling [ ] ; Back Pain [ ] ; Rash [ ]   Psych: Depression[ ] ; Anxiety[ ]   Heme: Bleeding problems [ ] ; Clotting disorders [ ] ; Anemia [ ]   Endocrine: Diabetes Cove.Etienne ]; Thyroid dysfunction[ ]    Past Medical History:  Diagnosis Date   Abnormal exercise myocardial perfusion study    03/2018- 38% ef, see report   Arthritis    Chronic kidney disease    Congestive heart failure (CHF) (HCC) 09/2015   Coronary artery disease    DOE (dyspnea on exertion)    ED (erectile dysfunction)    GERD (gastroesophageal reflux disease)    occ   Gout    H/O hiatal hernia    Hyperlipidemia    Hypertension    Myocardial infarction (HCC)    Neuropathy    toes   Obesity    RBBB (right bundle branch block)    Stenosis of right internal carotid artery    Type II diabetes mellitus (HCC)    Umbilical hernia    a. s/p repair.   Vertigo     Current Outpatient Medications  Medication Sig Dispense Refill   acetaminophen (TYLENOL) 500 MG tablet Take 500 mg by mouth every 6 (six) hours as needed for mild pain.     bisacodyl (DULCOLAX) 5 MG EC tablet Take 1 tablet (5 mg total) by mouth at bedtime as needed for moderate constipation. 100 tablet 0   Blood Glucose Monitoring Suppl DEVI 1 each by Does not apply route in the morning, at noon, and at bedtime. May substitute to any manufacturer covered by patient's insurance. (Patient not taking: Reported on 07/19/2023) 1 each 0   cetirizine (ZYRTEC ALLERGY) 10 MG tablet Take 1 tablet (10 mg total) by mouth daily. (Patient not taking: Reported on 07/20/2023) 90 tablet 1   clopidogrel (PLAVIX) 75 MG tablet TAKE 1 TABLET EVERY DAY 90 tablet 3   cyanocobalamin (VITAMIN B12) 1000 MCG tablet Take 1 tablet (1,000 mcg total) by mouth daily. 90 tablet 3   empagliflozin (JARDIANCE) 10 MG TABS tablet Take 1 tablet (10 mg total) by mouth daily before breakfast. 90 tablet 3   furosemide (LASIX) 20 MG tablet Take 1  tablet (20 mg total) by mouth daily. 30 tablet 0   iron polysaccharides (NIFEREX) 150 MG capsule Take 1 capsule (150 mg total) by mouth daily. (Patient not taking: Reported on 06/29/2023) 30 capsule 2   nitroGLYCERIN (NITROSTAT) 0.4 MG SL tablet Place 1 tablet (0.4 mg total) under the tongue every 5 (five) minutes as needed for chest pain. 15 tablet 3   pantoprazole (PROTONIX) 40 MG tablet Take 1 tablet (40 mg total) by mouth daily. 90 tablet 1   rosuvastatin (CRESTOR) 40 MG tablet TAKE 1 TABLET EVERY DAY 90 tablet 3   sucralfate (CARAFATE) 1 g tablet TAKE 1 TABLET BY MOUTH FOUR TIMES DAILY WITH MEALS AND AT BEDTIME (Patient not taking: Reported on 07/20/2023) 360 tablet 0  traZODone (DESYREL) 50 MG tablet Take 0.5 tablets (25 mg total) by mouth at bedtime as needed for sleep. (Patient not taking: Reported on 07/20/2023) 30 tablet 0   No current facility-administered medications for this visit.    Allergies  Allergen Reactions   Brilinta [Ticagrelor] Shortness Of Breath   Alpha-Gal Hives   Crestor [Rosuvastatin Calcium] Other (See Comments)    Muscle pain- tolerating this 2022, however   Lipitor [Atorvastatin] Other (See Comments)    Intolerable muscle pain all over       Social History   Socioeconomic History   Marital status: Widowed    Spouse name: Not on file   Number of children: 4   Years of education: Not on file   Highest education level: Not on file  Occupational History   Not on file  Tobacco Use   Smoking status: Former    Current packs/day: 0.00    Average packs/day: 1 pack/day for 9.0 years (9.0 ttl pk-yrs)    Types: Cigarettes    Start date: 10/30/1961    Quit date: 10/31/1970    Years since quitting: 52.7   Smokeless tobacco: Former    Types: Chew   Tobacco comments:    chewed for 2 years in his 20's  Vaping Use   Vaping status: Never Used  Substance and Sexual Activity   Alcohol use: Not Currently    Comment: occ wine last 6 months   Drug use: No   Sexual  activity: Yes  Other Topics Concern   Not on file  Social History Narrative   Lives in [place] with wife.  Retired.   Social Drivers of Corporate investment banker Strain: Low Risk  (05/14/2022)   Overall Financial Resource Strain (CARDIA)    Difficulty of Paying Living Expenses: Not hard at all  Food Insecurity: No Food Insecurity (06/25/2023)   Hunger Vital Sign    Worried About Running Out of Food in the Last Year: Never true    Ran Out of Food in the Last Year: Never true  Transportation Needs: No Transportation Needs (06/25/2023)   PRAPARE - Administrator, Civil Service (Medical): No    Lack of Transportation (Non-Medical): No  Physical Activity: Insufficiently Active (05/14/2022)   Exercise Vital Sign    Days of Exercise per Week: 3 days    Minutes of Exercise per Session: 30 min  Stress: No Stress Concern Present (05/14/2022)   [employer] of Occupational Health - Occupational Stress Questionnaire    Feeling of Stress : Only a little  Social Connections: Moderately Integrated (06/25/2023)   Social Connection and Isolation Panel [NHANES]    Frequency of Communication with Friends and Family: More than three times a week    Frequency of Social Gatherings with Friends and Family: More than three times a week    Attends Religious Services: More than 4 times per year    Active Member of [employer] or Organizations: Yes    Attends Banker Meetings: More than 4 times per year    Marital Status: Widowed  Intimate Partner Violence: Not At Risk (06/25/2023)   Humiliation, Afraid, Rape, and Kick questionnaire    Fear of Current or Ex-Partner: No    Emotionally Abused: No    Physically Abused: No    Sexually Abused: No     Family History  Problem Relation Age of Onset   Aneurysm Mother    Heart disease Father    Heart attack Father  Stroke Brother    Vitals:   07/28/23 1422  BP: 113/69  Pulse: 87  SpO2: 97%  Weight: 213 lb (96.6 kg)   Wt  Readings from Last 3 Encounters:  07/28/23 213 lb (96.6 kg)  07/20/23 212 lb (96.2 kg)  07/19/23 212 lb 6 oz (96.3 kg)   Lab Results  Component Value Date   CREATININE 1.24 (H) 07/20/2023   CREATININE 1.33 (H) 06/25/2023   CREATININE 1.47 (H) 06/24/2023   Vitals:   07/28/23 1422  BP: 113/69  Pulse: 87  SpO2: 97%  Weight: 213 lb (96.6 kg)   Wt Readings from Last 3 Encounters:  07/28/23 213 lb (96.6 kg)  07/20/23 212 lb (96.2 kg)  07/19/23 212 lb 6 oz (96.3 kg)   Lab Results  Component Value Date   CREATININE 1.24 (H) 07/20/2023   CREATININE 1.33 (H) 06/25/2023   CREATININE 1.47 (H) 06/24/2023    PHYSICAL EXAM:  General: Well appearing. No resp difficulty HEENT: normal Neck: supple, no JVD Cor: Regular rhythm, rate. No rubs, gallops or murmurs Lungs: rales bilateral lower lobes Abdomen: soft, nontender, nondistended. Extremities: no cyanosis, clubbing, rash, trace pitting edema bilateral lower legs Neuro: alert & oriented X 3. Moves all 4 extremities w/o difficulty. 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 PA pressure elevated at 19 and wedge slightly elevated at 16 - discussed medication management and HF guidelines - begin spironolactone 12.5mg  at bedtime - increase furosemide to 400mg  daily - BMET at next visit with the hopes of trying farxiga - explained getting more fluids in; has been eating applesauce and explained that there is fluid in that - since not eating much, can drink boost / ensure daily - BNP 06/24/23  reviewed and was 135.3  2: HTN- - BP 113/69 - saw PCP ([name]) 02/25 - BMET 07/20/23 reviewed and showed sodium 138, potassium 4.3, creatinine 1.24 & GFR 59  3: DM- - A1c 06/25/23 reviewed and was 6.8%  4: CAD- - CABG 1995 - saw cardiology ([place]ock) 02/25 - continue plavix 75mg  daily - continue rosuvastatin 40mg  daily  5: pHTN- - has upcoming pulmonology appt 05/25   Return in 1-2 weeks, sooner if needed.   Chris Freeze, FNP 07/27/23

## 2023-07-28 ENCOUNTER — Ambulatory Visit: Payer: Medicare HMO | Attending: Family | Admitting: Family

## 2023-07-28 ENCOUNTER — Encounter: Payer: Self-pay | Admitting: Family

## 2023-07-28 VITALS — BP 113/69 | HR 87 | Wt 213.0 lb

## 2023-07-28 DIAGNOSIS — I5020 Unspecified systolic (congestive) heart failure: Secondary | ICD-10-CM | POA: Diagnosis not present

## 2023-07-28 DIAGNOSIS — N1832 Chronic kidney disease, stage 3b: Secondary | ICD-10-CM

## 2023-07-28 DIAGNOSIS — Z7984 Long term (current) use of oral hypoglycemic drugs: Secondary | ICD-10-CM | POA: Diagnosis not present

## 2023-07-28 DIAGNOSIS — Z955 Presence of coronary angioplasty implant and graft: Secondary | ICD-10-CM | POA: Insufficient documentation

## 2023-07-28 DIAGNOSIS — Z8673 Personal history of transient ischemic attack (TIA), and cerebral infarction without residual deficits: Secondary | ICD-10-CM | POA: Diagnosis not present

## 2023-07-28 DIAGNOSIS — K449 Diaphragmatic hernia without obstruction or gangrene: Secondary | ICD-10-CM | POA: Diagnosis not present

## 2023-07-28 DIAGNOSIS — I25118 Atherosclerotic heart disease of native coronary artery with other forms of angina pectoris: Secondary | ICD-10-CM

## 2023-07-28 DIAGNOSIS — E1122 Type 2 diabetes mellitus with diabetic chronic kidney disease: Secondary | ICD-10-CM

## 2023-07-28 DIAGNOSIS — E785 Hyperlipidemia, unspecified: Secondary | ICD-10-CM | POA: Diagnosis not present

## 2023-07-28 DIAGNOSIS — Z951 Presence of aortocoronary bypass graft: Secondary | ICD-10-CM | POA: Diagnosis not present

## 2023-07-28 DIAGNOSIS — Z79899 Other long term (current) drug therapy: Secondary | ICD-10-CM | POA: Insufficient documentation

## 2023-07-28 DIAGNOSIS — I272 Pulmonary hypertension, unspecified: Secondary | ICD-10-CM | POA: Diagnosis not present

## 2023-07-28 DIAGNOSIS — I10 Essential (primary) hypertension: Secondary | ICD-10-CM | POA: Diagnosis not present

## 2023-07-28 DIAGNOSIS — I130 Hypertensive heart and chronic kidney disease with heart failure and stage 1 through stage 4 chronic kidney disease, or unspecified chronic kidney disease: Secondary | ICD-10-CM | POA: Insufficient documentation

## 2023-07-28 DIAGNOSIS — N189 Chronic kidney disease, unspecified: Secondary | ICD-10-CM | POA: Insufficient documentation

## 2023-07-28 DIAGNOSIS — Z8249 Family history of ischemic heart disease and other diseases of the circulatory system: Secondary | ICD-10-CM | POA: Insufficient documentation

## 2023-07-28 DIAGNOSIS — Z7902 Long term (current) use of antithrombotics/antiplatelets: Secondary | ICD-10-CM | POA: Insufficient documentation

## 2023-07-28 DIAGNOSIS — I252 Old myocardial infarction: Secondary | ICD-10-CM | POA: Diagnosis not present

## 2023-07-28 DIAGNOSIS — I2510 Atherosclerotic heart disease of native coronary artery without angina pectoris: Secondary | ICD-10-CM | POA: Diagnosis not present

## 2023-07-28 MED ORDER — FUROSEMIDE 40 MG PO TABS: 40.0000 mg | ORAL_TABLET | Freq: Every day | ORAL | 3 refills | Status: DC

## 2023-07-28 MED ORDER — SPIRONOLACTONE 25 MG PO TABS: 12.5000 mg | ORAL_TABLET | Freq: Every day | ORAL | 3 refills | Status: DC

## 2023-07-28 NOTE — Patient Instructions (Signed)
 Medication Changes:  START TAKING SPIRONOLACTONE 12.5 MG EVERY NIGHT AT BEDTIME  INCREASE FUROSEMIDE 40 MG ONCE DAILY   Follow-Up in: 1-2 weeks with Clarisa Kindred, FNP  At the [facility], you and your health needs are our priority. We have a designated team specialized in the treatment of Heart Failure. This Care Team includes your primary Heart Failure Specialized Cardiologist (physician), Advanced Practice Providers (APPs- Physician Assistants and Nurse Practitioners), and Pharmacist who all work together to provide you with the care you need, when you need it.   You may see any of the following providers on your designated Care Team at your next follow up:  Dr. Arvilla Meres Dr. Marca Ancona Dr. Dorthula Nettles Dr. Theresia Bough Clarisa Kindred, FNP Enos Fling, RPH-CPP  Please be sure to bring in all your medications bottles to every appointment.   Need to Contact us:  If you have any questions or concerns before your next appointment please send [place] a message through [place] or call our office at [phone].    TO LEAVE A MESSAGE FOR THE NURSE SELECT OPTION 2, PLEASE LEAVE A MESSAGE INCLUDING: YOUR NAME DATE OF BIRTH CALL BACK NUMBER REASON FOR CALL**this is important as we prioritize the call backs  YOU WILL RECEIVE A CALL BACK THE SAME DAY AS LONG AS YOU CALL BEFORE 4:00 PM

## 2023-07-28 NOTE — Progress Notes (Signed)
 WENT INTO ROOM 2 TO VERIFY PATIENT'S PHARMACY WITH HIM FOR FOR MEDICATION CHANGES. PT ASKED ME TO SEND THEM TO [facility] ON [address] BECAUSE [facility] WOULD TAKE A WHILE. PT ASKED IF HIS LASIX WAS BEING DOUBLED. I NOTIFIED PT THAT HIS LASIX WOULD BE DOUBLED. PT STATED HIS EX WIFE DIED FROM HAVING HER LASIX DOUBLED. PT PROCEEDED TO TELL THE STORY OF HIS EX WIFE'S DEATH. PT HAD QUESTIONS ABOUT WHY HIS MEDICATION WAS BEING DOUBLED AND STATED HE HAS BEEN LOSING WEIGHT SO HOW WOULD HIS FLUID BE INCREASING. NOTIFIED I WOULD BE BACK AND NOTIFY TINA OF HIS CONCERNS. PT WALKED OUT OF CLINIC AND STATED HE WAS JUST GOING TO SEE HIS REGULAR DOCTOR.

## 2023-08-11 ENCOUNTER — Emergency Department
Admission: EM | Admit: 2023-08-11 | Discharge: 2023-08-11 | Disposition: A | Discharge status: Home / Self Care | Attending: Emergency Medicine | Admitting: Emergency Medicine

## 2023-08-11 ENCOUNTER — Other Ambulatory Visit: Payer: Self-pay

## 2023-08-11 ENCOUNTER — Emergency Department

## 2023-08-11 DIAGNOSIS — N189 Chronic kidney disease, unspecified: Secondary | ICD-10-CM | POA: Diagnosis not present

## 2023-08-11 DIAGNOSIS — R1084 Generalized abdominal pain: Secondary | ICD-10-CM | POA: Diagnosis not present

## 2023-08-11 DIAGNOSIS — R109 Unspecified abdominal pain: Secondary | ICD-10-CM | POA: Diagnosis present

## 2023-08-11 DIAGNOSIS — I2510 Atherosclerotic heart disease of native coronary artery without angina pectoris: Secondary | ICD-10-CM | POA: Insufficient documentation

## 2023-08-11 DIAGNOSIS — E119 Type 2 diabetes mellitus without complications: Secondary | ICD-10-CM | POA: Diagnosis not present

## 2023-08-11 DIAGNOSIS — Z79899 Other long term (current) drug therapy: Secondary | ICD-10-CM | POA: Diagnosis not present

## 2023-08-11 DIAGNOSIS — I129 Hypertensive chronic kidney disease with stage 1 through stage 4 chronic kidney disease, or unspecified chronic kidney disease: Secondary | ICD-10-CM | POA: Insufficient documentation

## 2023-08-11 DIAGNOSIS — I10 Essential (primary) hypertension: Secondary | ICD-10-CM | POA: Diagnosis not present

## 2023-08-11 DIAGNOSIS — I4510 Unspecified right bundle-branch block: Secondary | ICD-10-CM | POA: Diagnosis not present

## 2023-08-11 DIAGNOSIS — R638 Other symptoms and signs concerning food and fluid intake: Secondary | ICD-10-CM | POA: Diagnosis not present

## 2023-08-11 DIAGNOSIS — N281 Cyst of kidney, acquired: Secondary | ICD-10-CM | POA: Diagnosis not present

## 2023-08-11 DIAGNOSIS — I959 Hypotension, unspecified: Secondary | ICD-10-CM | POA: Diagnosis not present

## 2023-08-11 DIAGNOSIS — I509 Heart failure, unspecified: Secondary | ICD-10-CM | POA: Insufficient documentation

## 2023-08-11 DIAGNOSIS — K449 Diaphragmatic hernia without obstruction or gangrene: Secondary | ICD-10-CM | POA: Diagnosis not present

## 2023-08-11 DIAGNOSIS — K5730 Diverticulosis of large intestine without perforation or abscess without bleeding: Secondary | ICD-10-CM | POA: Diagnosis not present

## 2023-08-11 DIAGNOSIS — N200 Calculus of kidney: Secondary | ICD-10-CM | POA: Diagnosis not present

## 2023-08-11 DIAGNOSIS — I499 Cardiac arrhythmia, unspecified: Secondary | ICD-10-CM | POA: Diagnosis not present

## 2023-08-11 LAB — COMPREHENSIVE METABOLIC PANEL
ALT: 11 U/L (ref 0–44)
AST: 25 U/L (ref 15–41)
Albumin: 3.4 g/dL — ABNORMAL LOW (ref 3.5–5.0)
Alkaline Phosphatase: 44 U/L (ref 38–126)
Anion gap: 8 (ref 5–15)
BUN: 17 mg/dL (ref 8–23)
CO2: 20 mmol/L — ABNORMAL LOW (ref 22–32)
Calcium: 8.7 mg/dL — ABNORMAL LOW (ref 8.9–10.3)
Chloride: 109 mmol/L (ref 98–111)
Creatinine, Ser: 1.42 mg/dL — ABNORMAL HIGH (ref 0.61–1.24)
GFR, Estimated: 50 mL/min — ABNORMAL LOW (ref >60)
Glucose, Bld: 126 mg/dL — ABNORMAL HIGH (ref 70–99)
Potassium: 3.3 mmol/L — ABNORMAL LOW (ref 3.5–5.1)
Sodium: 137 mmol/L (ref 135–145)
Total Bilirubin: 0.6 mg/dL (ref 0.0–1.2)
Total Protein: 7.9 g/dL (ref 6.5–8.1)

## 2023-08-11 LAB — CBC
HCT: 34.9 % — ABNORMAL LOW (ref 39.0–52.0)
Hemoglobin: 10.2 g/dL — ABNORMAL LOW (ref 13.0–17.0)
MCH: 20.6 pg — ABNORMAL LOW (ref 26.0–34.0)
MCHC: 29.2 g/dL — ABNORMAL LOW (ref 30.0–36.0)
MCV: 70.4 fL — ABNORMAL LOW (ref 80.0–100.0)
Platelets: 352 10*3/uL (ref 150–400)
RBC: 4.96 MIL/uL (ref 4.22–5.81)
RDW: 19 % — ABNORMAL HIGH (ref 11.5–15.5)
WBC: 8.7 10*3/uL (ref 4.0–10.5)
nRBC: 0 % (ref 0.0–0.2)

## 2023-08-11 LAB — LIPASE, BLOOD: Lipase: 42 U/L (ref 11–51)

## 2023-08-11 LAB — TROPONIN I (HIGH SENSITIVITY): Troponin I (High Sensitivity): 8 ng/L (ref <18)

## 2023-08-11 MED ORDER — SODIUM CHLORIDE 0.9 % IV SOLN
INTRAVENOUS | Status: DC
  Administered 2023-08-11: 75 mL/h via INTRAVENOUS

## 2023-08-11 MED ORDER — IOHEXOL 300 MG/ML  SOLN
100.0000 mL | Freq: Once | INTRAMUSCULAR | Status: AC | PRN
  Administered 2023-08-11: 100 mL via INTRAVENOUS

## 2023-08-11 MED ORDER — PANTOPRAZOLE SODIUM 40 MG IV SOLR
40.0000 mg | Freq: Once | INTRAVENOUS | Status: AC
  Administered 2023-08-11: 40 mg via INTRAVENOUS
  Filled 2023-08-11: qty 10

## 2023-08-11 NOTE — ED Provider Notes (Signed)
 Patient received in signout from Dr. Elesa Massed pending follow-up CTA.  CTA does not show any evidence of SBO or gastric outlet obstruction.  Patient feels improved.  Plan will be outpatient follow-up with Dr. Everlene Farrier.   Willy Eddy, MD 08/11/23 1047

## 2023-08-11 NOTE — ED Notes (Signed)
 Patient transported to CT

## 2023-08-11 NOTE — ED Provider Notes (Signed)
 [facility] Provider Note    Event Date/Time   First MD Initiated Contact with Patient 08/11/23 [phone]     (approximate)   History   Abdominal Pain   HPI  Chris [name]. is a 83 y.o. male with history of CAD, CHF, hypertension, diabetes, hyperlipidemia, prior umbilical hernia repair, known large hiatal hernia who presents to the emergency department with upper abdominal pain for the past couple of days with nausea without vomiting.  States pain is severe after eating.  States he is not able to eat any food and drinking fluids causes him discomfort as well.  He denies any diarrhea.  He states that he has chronic shortness of breath which is unchanged.  Patient reports his pain has improved with fentanyl given by EMS.  He was here in January 2025 for similar symptoms.  He was diagnosed with a large intrathoracic hiatal hernia and offered admission for surgical evaluation which she declined.  He has not yet scheduled an outpatient appointment.   History provided by patient.    Past Medical History:  Diagnosis Date   Abnormal exercise myocardial perfusion study    03/2018- 38% ef, see report   Arthritis    Chronic kidney disease    Congestive heart failure (CHF) (HCC) 09/2015   Coronary artery disease    DOE (dyspnea on exertion)    ED (erectile dysfunction)    GERD (gastroesophageal reflux disease)    occ   Gout    H/O hiatal hernia    Hyperlipidemia    Hypertension    Myocardial infarction (HCC)    Neuropathy    toes   Obesity    RBBB (right bundle branch block)    Stenosis of right internal carotid artery    Type II diabetes mellitus (HCC)    Umbilical hernia    a. s/p repair.   Vertigo     Past Surgical History:  Procedure Laterality Date   ANKLE ARTHROSCOPY WITH DRILLING/MICROFRACTURE Left 04/13/2013   Procedure: LEFT ANKLE ARTHROSCOPY WITH EXTENSIVE DEBRIEDMENT;  Surgeon: Toni Arthurs, MD;  Location: [facility];   Service: Orthopedics;  Laterality: Left;   ANKLE SURGERY Left 08/18/2013   DR HEWITT   CARDIAC CATHETERIZATION  2000   stents x3   CARDIAC CATHETERIZATION  02/15/2014   Procedure: LEFT HEART CATH AND CORS/GRAFTS ANGIOGRAPHY;  Surgeon: Pamella Pert, MD;  Location: [facility] CATH LAB;  Service: Cardiovascular;;   COLONOSCOPY     CORONARY ARTERY BYPASS GRAFT  1995   LIMA to LAD, SVG to RCA, SVG to OM.    CORONARY STENT INTERVENTION N/A 08/12/2020   Procedure: CORONARY STENT INTERVENTION;  Surgeon: Elder Negus, MD;  Location: [facility] INVASIVE CV LAB;  Service: Cardiovascular;  Laterality: N/A;   CORONARY STENT INTERVENTION N/A 09/04/2021   Procedure: CORONARY STENT INTERVENTION;  Surgeon: Yates Decamp, MD;  Location: [facility] INVASIVE CV LAB;  Service: Cardiovascular;  Laterality: N/A;   LEFT HEART CATH AND CORS/GRAFTS ANGIOGRAPHY N/A 08/12/2020   Procedure: LEFT HEART CATH AND CORS/GRAFTS ANGIOGRAPHY;  Surgeon: Elder Negus, MD;  Location: [facility] INVASIVE CV LAB;  Service: Cardiovascular;  Laterality: N/A;   LEFT HEART CATH AND CORS/GRAFTS ANGIOGRAPHY N/A 09/04/2021   Procedure: LEFT HEART CATH AND CORS/GRAFTS ANGIOGRAPHY;  Surgeon: Yates Decamp, MD;  Location: [facility] INVASIVE CV LAB;  Service: Cardiovascular;  Laterality: N/A;   RIGHT/LEFT HEART CATH AND CORONARY/GRAFT ANGIOGRAPHY N/A 07/05/2023   Procedure: RIGHT/LEFT HEART CATH AND CORONARY/GRAFT ANGIOGRAPHY;  Surgeon: Yates Decamp, MD;  Location: [facility] INVASIVE CV LAB;  Service: Cardiovascular;  Laterality: N/A;   TOTAL ANKLE ARTHROPLASTY Left 08/17/2013   Procedure: LEFT TOTAL ANKLE REPLACEMENT WITH POSSIBLE GASTROC RECESSION ;  Surgeon: Toni Arthurs, MD;  Location: [facility] OR;  Service: Orthopedics;  Laterality: Left;   UMBILICAL HERNIA REPAIR  12/2007    MEDICATIONS:  Prior to Admission medications   Medication Sig Start Date End Date Taking? Authorizing Provider  acetaminophen (TYLENOL) 500 MG tablet Take 500 mg by mouth every 6 (six) hours as needed for mild  pain.    [provider]  bisacodyl (DULCOLAX) 5 MG EC tablet Take 1 tablet (5 mg total) by mouth at bedtime as needed for moderate constipation. 06/25/23   Gillis Santa, MD  Blood Glucose Monitoring Suppl DEVI 1 each by Does not apply route in the morning, at noon, and at bedtime. May substitute to any manufacturer covered by patient's insurance. 11/16/22   Donita Brooks, MD  cetirizine (ZYRTEC ALLERGY) 10 MG tablet Take 1 tablet (10 mg total) by mouth daily. 07/19/23   Charlsie Quest, NP  clopidogrel (PLAVIX) 75 MG tablet TAKE 1 TABLET EVERY DAY 11/23/22   Donita Brooks, MD  cyanocobalamin (VITAMIN B12) 1000 MCG tablet Take 1 tablet (1,000 mcg total) by mouth daily. 07/20/23 07/14/24  Donita Brooks, MD  empagliflozin (JARDIANCE) 10 MG TABS tablet Take 1 tablet (10 mg total) by mouth daily before breakfast. Patient not taking: Reported on 07/28/2023 07/22/23   Donita Brooks, MD  furosemide (LASIX) 40 MG tablet Take 1 tablet (40 mg total) by mouth daily. 07/28/23 10/26/23  Delma Freeze, FNP  iron polysaccharides (NIFEREX) 150 MG capsule Take 1 capsule (150 mg total) by mouth daily. Patient not taking: Reported on 06/29/2023 06/25/23 09/23/23  Gillis Santa, MD  nitroGLYCERIN (NITROSTAT) 0.4 MG SL tablet Place 1 tablet (0.4 mg total) under the tongue every 5 (five) minutes as needed for chest pain. 04/06/22   Donita Brooks, MD  pantoprazole (PROTONIX) 40 MG tablet Take 1 tablet (40 mg total) by mouth daily. 05/31/23   Donita Brooks, MD  rosuvastatin (CRESTOR) 40 MG tablet TAKE 1 TABLET EVERY DAY 06/10/23   Yates Decamp, MD  spironolactone (ALDACTONE) 25 MG tablet Take 0.5 tablets (12.5 mg total) by mouth daily. 07/28/23 10/26/23  Delma Freeze, FNP  sucralfate (CARAFATE) 1 g tablet TAKE 1 TABLET BY MOUTH FOUR TIMES DAILY WITH MEALS AND AT BEDTIME Patient not taking: No sig reported 05/12/23   Donita Brooks, MD  traZODone (DESYREL) 50 MG tablet Take 0.5 tablets (25 mg total) by mouth at  bedtime as needed for sleep. 06/22/23   Park Meo, FNP    Physical Exam   Triage Vital Signs: ED Triage Vitals  Encounter Vitals Group     BP 08/11/23 0428 115/63     Systolic BP Percentile --      Diastolic BP Percentile --      Pulse Rate 08/11/23 0428 88     Resp 08/11/23 0428 18     Temp 08/11/23 0428 97.9 F (36.6 C)     Temp Source 08/11/23 0428 Oral     SpO2 08/11/23 0428 96 %     Weight 08/11/23 0427 213 lb (96.6 kg)     Height --      Head Circumference --      Peak Flow --      Pain Score 08/11/23 0425 6  Pain Loc --      Pain Education --      Exclude from Growth Chart --     Most recent vital signs: Vitals:   08/11/23 0428  BP: 115/63  Pulse: 88  Resp: 18  Temp: 97.9 F (36.6 C)  SpO2: 96%    CONSTITUTIONAL: Alert, responds appropriately to questions.  Elderly, in no apparent distress HEAD: Normocephalic, atraumatic EYES: Conjunctivae clear, pupils appear equal, sclera nonicteric ENT: normal nose; dry appearing mucous membranes NECK: Supple, normal ROM CARD: RRR; S1 and S2 appreciated RESP: Normal chest excursion without splinting or tachypnea; breath sounds clear and equal bilaterally; no wheezes, no rhonchi, no rales, no hypoxia or respiratory distress, speaking full sentences ABD/GI: Non-distended; soft, tender throughout the upper abdomen without guarding, rebound BACK: The back appears normal EXT: Normal ROM in all joints; no deformity noted, no edema SKIN: Normal color for age and race; warm; no rash on exposed skin NEURO: Moves all extremities equally, normal speech PSYCH: The patient's mood and manner are appropriate.   ED Results / Procedures / Treatments   LABS: (all labs ordered are listed, but only abnormal results are displayed) Labs Reviewed  COMPREHENSIVE METABOLIC PANEL - Abnormal; Notable for the following components:      Result Value   Potassium 3.3 (*)    CO2 20 (*)    Glucose, Bld 126 (*)    Creatinine, Ser 1.42  (*)    Calcium 8.7 (*)    Albumin 3.4 (*)    GFR, Estimated 50 (*)    All other components within normal limits  CBC - Abnormal; Notable for the following components:   Hemoglobin 10.2 (*)    HCT 34.9 (*)    MCV 70.4 (*)    MCH 20.6 (*)    MCHC 29.2 (*)    RDW 19.0 (*)    All other components within normal limits  LIPASE, BLOOD  URINALYSIS, ROUTINE W REFLEX MICROSCOPIC  TROPONIN I (HIGH SENSITIVITY)  TROPONIN I (HIGH SENSITIVITY)     EKG:  EKG Interpretation Date/Time:  Wednesday August 11 2023 04:38:28 EDT Ventricular Rate:  85 PR Interval:  170 QRS Duration:  134 QT Interval:  460 QTC Calculation: 547 R Axis:   134  Text Interpretation: Sinus rhythm with occasional Premature ventricular complexes Right bundle branch block Left posterior fascicular block Bifascicular block Inferior infarct (cited on or before 04-Sep-2021) Abnormal ECG When compared with ECG of 19-Jul-2023 15:09, Premature ventricular complexes are now Present Questionable change in initial forces of Inferior leads T wave inversion now evident in Anterior leads QT has lengthened Confirmed by Rochele Raring [phone]) on 08/11/2023 6:17:00 AM         RADIOLOGY: My personal review and interpretation of imaging:  CT pending.  I have personally reviewed all radiology reports.   No results found.   PROCEDURES:  Critical Care performed: No      Procedures    IMPRESSION / MDM / ASSESSMENT AND PLAN / ED COURSE  I reviewed the triage vital signs and the nursing notes.    Patient here with complaints of upper abdominal pain, nausea, significant pain with eating and drinking.  History of known large intrathoracic hiatal hernia.  The patient is on the cardiac monitor to evaluate for evidence of arrhythmia and/or significant heart rate changes.   DIFFERENTIAL DIAGNOSIS (includes but not limited to):   Hiatal hernia, gastric outlet obstruction, GERD, esophagitis, ACS, CHF   Patient's presentation is  most consistent with  acute presentation with potential threat to life or bodily function.   PLAN: Workup initiated from triage.  Chronic anemia which is stable.  Creatinine of 1.4 which is slightly higher than recent of 1.24.  Normal LFTs, lipase.  Troponin negative.  This seems very atypical for ACS.  Suspect symptoms related to his hiatal hernia.  Patient states "something has to be done about this" stating that "I think I am going to die" because he is not able to eat without significant pain.  Does report his pain improved with fentanyl.  He reports he does have medications at home which she only took once without much relief.  He is able to swallow his own secretions and is not vomiting here.  His abdomen is not distended.  Will discuss with general surgery here for further recommendations.  Will give gentle IV fluids due to decreased po intake, Protonix.   MEDICATIONS GIVEN IN ED: Medications  0.9 %  sodium chloride infusion (75 mL/hr Intravenous New Bag/Given 08/11/23 0634)  pantoprazole (PROTONIX) injection 40 mg (40 mg Intravenous Given 08/11/23 2951)     ED COURSE: Discussed with Dr. Maia Plan on-call for general surgery.  He agrees that it sounds unlikely that patient has a gastric outlet obstruction given no vomiting but recommends CT imaging with oral and IV contrast to rule this out.  Unfortunately Dr. Everlene Farrier who is our local surgeon he would surgically manage this hiatal hernia is out of town.  Dr. Maia Plan states that if patient does have an obstruction or is unable to tolerate p.o. that he would need to be transferred to an outside hospital for surgical intervention as this cannot be done here given our surgeon is not available.  Discussed this with patient.  Patient seems to be somewhat confused and I wonder if there is some component of underlying dementia.  He reports he does live with his grandson but does not give me permission to call him.  His daughter Ahmed Prima is listed as the  emergency contact in his chart and he gives me permission to talk to her however when I called her number there was no answer.  CT scan pending.  Signed out the oncoming ED physician at 7 AM.   CONSULTS: Dr. Maia Plan with general surgery consulted.   OUTSIDE RECORDS REVIEWED: Reviewed last cardiology note on 07/28/2023.  Reviewed last PCP note on 07/19/2023.       FINAL CLINICAL IMPRESSION(S) / ED DIAGNOSES   Final diagnoses:  Hiatal hernia  Decreased oral intake     Rx / DC Orders   ED Discharge Orders     None        Note:  This document was prepared using Dragon voice recognition software and may include unintentional dictation errors.   Eniyah Eastmond, Layla Maw, DO 08/11/23 [phone]

## 2023-08-11 NOTE — ED Notes (Signed)
 Pt returned from CT

## 2023-08-11 NOTE — ED Triage Notes (Addendum)
 Pt to ED via [facility] from home c/o epigastric pain and feeling like he has a food bolus stuck. Pt reporrs started around 5pm. Pt has hiatal hernia. Has been here for same before. EMS gave fentanyl at 0405. Pt able to speak in complete sentences.

## 2023-08-20 NOTE — Telephone Encounter (Signed)
 Copied from CRM [id number]. Topic: Referral - Request for Referral >> Aug 20, 2023 12:43 PM Louie Casa B wrote: Did the patient discuss referral with their provider in the last year? Yes (If No - schedule appointment) (If Yes - send message)  Appointment offered? No  Type of order/referral and detailed reason for visit: hernia  Preference of office, provider, location: dr Jed Limerick ramirs  If referral order, have you been seen by this specialty before? Yes (If Yes, this issue or another issue? When? Where?  Can we respond through MyChart? No

## 2023-08-23 ENCOUNTER — Ambulatory Visit: Admitting: Surgery

## 2023-08-23 ENCOUNTER — Telehealth: Payer: Self-pay | Admitting: Internal Medicine

## 2023-08-23 NOTE — Telephone Encounter (Signed)
 Patient Phone Call   Received page regarding patient, called back. Patient stated he was having a "hernia attack" and he needed a surgery. He proceeded to cry out how severe the pain was, and I instructed him that he needed to go the ER if he was having severe refractory pain. At some point in the conversation he stopped responding and the phone went silent. I again re-iterated he should go to the ED for his severe pain and "hernia attack".  Achille Rich, MD Cardiology

## 2023-08-24 ENCOUNTER — Ambulatory Visit: Payer: Self-pay | Admitting: Surgery

## 2023-08-24 DIAGNOSIS — Z634 Disappearance and death of family member: Secondary | ICD-10-CM | POA: Diagnosis not present

## 2023-08-24 DIAGNOSIS — I5022 Chronic systolic (congestive) heart failure: Secondary | ICD-10-CM | POA: Diagnosis not present

## 2023-08-24 DIAGNOSIS — K3189 Other diseases of stomach and duodenum: Secondary | ICD-10-CM | POA: Insufficient documentation

## 2023-08-24 DIAGNOSIS — R413 Other amnesia: Secondary | ICD-10-CM | POA: Insufficient documentation

## 2023-08-24 DIAGNOSIS — R634 Abnormal weight loss: Secondary | ICD-10-CM | POA: Insufficient documentation

## 2023-08-24 DIAGNOSIS — K440 Diaphragmatic hernia with obstruction, without gangrene: Secondary | ICD-10-CM | POA: Diagnosis not present

## 2023-08-24 DIAGNOSIS — Z7901 Long term (current) use of anticoagulants: Secondary | ICD-10-CM | POA: Diagnosis not present

## 2023-08-25 ENCOUNTER — Telehealth: Payer: Self-pay | Admitting: Cardiology

## 2023-08-25 NOTE — Telephone Encounter (Signed)
   Pre-operative Risk Assessment    Patient Name: Miguelangel Korn.  DOB: 13-Jul-1940 MRN: [id number]   Date of last office visit: 07/19/23 Date of next office visit: 10/19/23   Request for Surgical Clearance    Procedure:  Hiatal Hernia Repair  Date of Surgery:  Clearance TBD                                Surgeon:  Dr. Karie Soda Surgeon's Group or Practice Name:  [facility] Phone number:  [phone] Fax number:  [phone]   Type of Clearance Requested:   - Pharmacy:  Hold Apixaban (Eliquis)     Type of Anesthesia:  General    Additional requests/questions:    Signed, Narda Amber   08/25/2023, 10:45 AM

## 2023-08-25 NOTE — Telephone Encounter (Signed)
   Pre-operative Risk Assessment    Patient Name: Chris [name].  DOB: 1941-02-26 MRN: [id number]   Date of last office visit: unknown Date of next office visit: unknown   Request for Surgical Clearance    Procedure:   Hitatal Hernia Repair  Date of Surgery:  Clearance TBD                                Surgeon:  Dr. Viviann Spare gross Surgeon's Group or Practice Name:  [facility] Phone number:  [phone] Fax number:  [phone]   Type of Clearance Requested:   - Pharmacy:  Hold Apixaban (Eliquis) follow instructions how to hold medication   Type of Anesthesia:  General    Additional requests/questions:    Burnett Sheng   08/25/2023, 10:41 AM

## 2023-08-25 NOTE — Telephone Encounter (Signed)
 Duplicate request

## 2023-08-26 NOTE — Telephone Encounter (Signed)
 Caller Elease Hashimoto) called to report patient's medication was entered incorrectly and noted patient is taking Plavix not Eliquis.

## 2023-08-26 NOTE — Telephone Encounter (Signed)
 Patient does not have Eliquis in med list.  Is he taking this?

## 2023-08-26 NOTE — Telephone Encounter (Signed)
 Request was sent to pharmD for eliquis hold. Patient is not on eliquis.   Can you please reach out and clarify request?

## 2023-08-27 ENCOUNTER — Telehealth: Payer: Self-pay | Admitting: Family Medicine

## 2023-08-27 ENCOUNTER — Emergency Department (HOSPITAL_COMMUNITY)

## 2023-08-27 ENCOUNTER — Encounter: Payer: Self-pay | Admitting: Cardiology

## 2023-08-27 ENCOUNTER — Encounter (HOSPITAL_COMMUNITY): Payer: Self-pay

## 2023-08-27 ENCOUNTER — Ambulatory Visit: Payer: Self-pay | Admitting: *Deleted

## 2023-08-27 ENCOUNTER — Telehealth: Payer: Self-pay | Admitting: Gastroenterology

## 2023-08-27 ENCOUNTER — Other Ambulatory Visit: Payer: Self-pay

## 2023-08-27 ENCOUNTER — Inpatient Hospital Stay (HOSPITAL_COMMUNITY)
Admission: EM | Admit: 2023-08-27 | Discharge: 2023-09-03 | DRG: 326 | Disposition: A | Discharge status: Home / Self Care | Attending: Internal Medicine | Admitting: Internal Medicine

## 2023-08-27 ENCOUNTER — Other Ambulatory Visit: Payer: Self-pay | Admitting: Family Medicine

## 2023-08-27 DIAGNOSIS — Z7902 Long term (current) use of antithrombotics/antiplatelets: Secondary | ICD-10-CM

## 2023-08-27 DIAGNOSIS — K440 Diaphragmatic hernia with obstruction, without gangrene: Secondary | ICD-10-CM | POA: Diagnosis not present

## 2023-08-27 DIAGNOSIS — Z01818 Encounter for other preprocedural examination: Secondary | ICD-10-CM | POA: Diagnosis not present

## 2023-08-27 DIAGNOSIS — R634 Abnormal weight loss: Secondary | ICD-10-CM | POA: Diagnosis present

## 2023-08-27 DIAGNOSIS — R079 Chest pain, unspecified: Secondary | ICD-10-CM | POA: Diagnosis not present

## 2023-08-27 DIAGNOSIS — Z7901 Long term (current) use of anticoagulants: Secondary | ICD-10-CM | POA: Diagnosis not present

## 2023-08-27 DIAGNOSIS — E1122 Type 2 diabetes mellitus with diabetic chronic kidney disease: Secondary | ICD-10-CM | POA: Diagnosis not present

## 2023-08-27 DIAGNOSIS — K219 Gastro-esophageal reflux disease without esophagitis: Secondary | ICD-10-CM | POA: Diagnosis present

## 2023-08-27 DIAGNOSIS — Z5982 Transportation insecurity: Secondary | ICD-10-CM | POA: Diagnosis not present

## 2023-08-27 DIAGNOSIS — N2889 Other specified disorders of kidney and ureter: Secondary | ICD-10-CM | POA: Diagnosis not present

## 2023-08-27 DIAGNOSIS — I509 Heart failure, unspecified: Secondary | ICD-10-CM | POA: Diagnosis not present

## 2023-08-27 DIAGNOSIS — Z951 Presence of aortocoronary bypass graft: Secondary | ICD-10-CM

## 2023-08-27 DIAGNOSIS — R112 Nausea with vomiting, unspecified: Secondary | ICD-10-CM | POA: Diagnosis not present

## 2023-08-27 DIAGNOSIS — Z634 Disappearance and death of family member: Secondary | ICD-10-CM

## 2023-08-27 DIAGNOSIS — Z87891 Personal history of nicotine dependence: Secondary | ICD-10-CM

## 2023-08-27 DIAGNOSIS — K3189 Other diseases of stomach and duodenum: Secondary | ICD-10-CM | POA: Diagnosis not present

## 2023-08-27 DIAGNOSIS — I5042 Chronic combined systolic (congestive) and diastolic (congestive) heart failure: Secondary | ICD-10-CM | POA: Diagnosis not present

## 2023-08-27 DIAGNOSIS — I214 Non-ST elevation (NSTEMI) myocardial infarction: Secondary | ICD-10-CM

## 2023-08-27 DIAGNOSIS — K469 Unspecified abdominal hernia without obstruction or gangrene: Secondary | ICD-10-CM

## 2023-08-27 DIAGNOSIS — R109 Unspecified abdominal pain: Secondary | ICD-10-CM | POA: Diagnosis not present

## 2023-08-27 DIAGNOSIS — J8410 Pulmonary fibrosis, unspecified: Secondary | ICD-10-CM

## 2023-08-27 DIAGNOSIS — I5022 Chronic systolic (congestive) heart failure: Secondary | ICD-10-CM | POA: Diagnosis not present

## 2023-08-27 DIAGNOSIS — I130 Hypertensive heart and chronic kidney disease with heart failure and stage 1 through stage 4 chronic kidney disease, or unspecified chronic kidney disease: Secondary | ICD-10-CM | POA: Diagnosis not present

## 2023-08-27 DIAGNOSIS — I10 Essential (primary) hypertension: Secondary | ICD-10-CM

## 2023-08-27 DIAGNOSIS — N1832 Chronic kidney disease, stage 3b: Secondary | ICD-10-CM | POA: Diagnosis not present

## 2023-08-27 DIAGNOSIS — I252 Old myocardial infarction: Secondary | ICD-10-CM | POA: Diagnosis not present

## 2023-08-27 DIAGNOSIS — E669 Obesity, unspecified: Secondary | ICD-10-CM | POA: Diagnosis present

## 2023-08-27 DIAGNOSIS — N200 Calculus of kidney: Secondary | ICD-10-CM | POA: Diagnosis present

## 2023-08-27 DIAGNOSIS — N289 Disorder of kidney and ureter, unspecified: Secondary | ICD-10-CM

## 2023-08-27 DIAGNOSIS — R1310 Dysphagia, unspecified: Secondary | ICD-10-CM

## 2023-08-27 DIAGNOSIS — M20011 Mallet finger of right finger(s): Secondary | ICD-10-CM

## 2023-08-27 DIAGNOSIS — I110 Hypertensive heart disease with heart failure: Secondary | ICD-10-CM | POA: Diagnosis not present

## 2023-08-27 DIAGNOSIS — R55 Syncope and collapse: Secondary | ICD-10-CM | POA: Diagnosis present

## 2023-08-27 DIAGNOSIS — Z6827 Body mass index (BMI) 27.0-27.9, adult: Secondary | ICD-10-CM | POA: Diagnosis not present

## 2023-08-27 DIAGNOSIS — R413 Other amnesia: Secondary | ICD-10-CM | POA: Diagnosis present

## 2023-08-27 DIAGNOSIS — K5730 Diverticulosis of large intestine without perforation or abscess without bleeding: Secondary | ICD-10-CM | POA: Diagnosis not present

## 2023-08-27 DIAGNOSIS — K562 Volvulus: Secondary | ICD-10-CM | POA: Diagnosis not present

## 2023-08-27 DIAGNOSIS — Z955 Presence of coronary angioplasty implant and graft: Secondary | ICD-10-CM

## 2023-08-27 DIAGNOSIS — E785 Hyperlipidemia, unspecified: Secondary | ICD-10-CM | POA: Diagnosis present

## 2023-08-27 DIAGNOSIS — G4734 Idiopathic sleep related nonobstructive alveolar hypoventilation: Secondary | ICD-10-CM | POA: Diagnosis present

## 2023-08-27 DIAGNOSIS — Z79899 Other long term (current) drug therapy: Secondary | ICD-10-CM

## 2023-08-27 DIAGNOSIS — G4733 Obstructive sleep apnea (adult) (pediatric): Secondary | ICD-10-CM | POA: Diagnosis present

## 2023-08-27 DIAGNOSIS — K449 Diaphragmatic hernia without obstruction or gangrene: Secondary | ICD-10-CM | POA: Diagnosis not present

## 2023-08-27 DIAGNOSIS — Z823 Family history of stroke: Secondary | ICD-10-CM

## 2023-08-27 DIAGNOSIS — Z8249 Family history of ischemic heart disease and other diseases of the circulatory system: Secondary | ICD-10-CM

## 2023-08-27 DIAGNOSIS — Z888 Allergy status to other drugs, medicaments and biological substances status: Secondary | ICD-10-CM

## 2023-08-27 DIAGNOSIS — I2510 Atherosclerotic heart disease of native coronary artery without angina pectoris: Secondary | ICD-10-CM | POA: Diagnosis not present

## 2023-08-27 DIAGNOSIS — R0902 Hypoxemia: Secondary | ICD-10-CM | POA: Diagnosis present

## 2023-08-27 DIAGNOSIS — J849 Interstitial pulmonary disease, unspecified: Secondary | ICD-10-CM | POA: Diagnosis not present

## 2023-08-27 DIAGNOSIS — R1013 Epigastric pain: Secondary | ICD-10-CM | POA: Diagnosis present

## 2023-08-27 DIAGNOSIS — N281 Cyst of kidney, acquired: Secondary | ICD-10-CM | POA: Diagnosis present

## 2023-08-27 DIAGNOSIS — R0609 Other forms of dyspnea: Secondary | ICD-10-CM

## 2023-08-27 DIAGNOSIS — I5020 Unspecified systolic (congestive) heart failure: Secondary | ICD-10-CM | POA: Diagnosis present

## 2023-08-27 DIAGNOSIS — R0789 Other chest pain: Secondary | ICD-10-CM | POA: Diagnosis not present

## 2023-08-27 DIAGNOSIS — I639 Cerebral infarction, unspecified: Secondary | ICD-10-CM

## 2023-08-27 DIAGNOSIS — Z7984 Long term (current) use of oral hypoglycemic drugs: Secondary | ICD-10-CM

## 2023-08-27 DIAGNOSIS — Z96662 Presence of left artificial ankle joint: Secondary | ICD-10-CM | POA: Diagnosis present

## 2023-08-27 DIAGNOSIS — Z8673 Personal history of transient ischemic attack (TIA), and cerebral infarction without residual deficits: Secondary | ICD-10-CM

## 2023-08-27 LAB — CBC WITH DIFFERENTIAL/PLATELET
Abs Immature Granulocytes: 0.02 10*3/uL (ref 0.00–0.07)
Basophils Absolute: 0.1 10*3/uL (ref 0.0–0.1)
Basophils Relative: 1 %
Eosinophils Absolute: 0.5 10*3/uL (ref 0.0–0.5)
Eosinophils Relative: 8 %
HCT: 36.3 % — ABNORMAL LOW (ref 39.0–52.0)
Hemoglobin: 10.2 g/dL — ABNORMAL LOW (ref 13.0–17.0)
Immature Granulocytes: 0 %
Lymphocytes Relative: 31 %
Lymphs Abs: 2.1 10*3/uL (ref 0.7–4.0)
MCH: 20.4 pg — ABNORMAL LOW (ref 26.0–34.0)
MCHC: 28.1 g/dL — ABNORMAL LOW (ref 30.0–36.0)
MCV: 72.7 fL — ABNORMAL LOW (ref 80.0–100.0)
Monocytes Absolute: 0.7 10*3/uL (ref 0.1–1.0)
Monocytes Relative: 11 %
Neutro Abs: 3.3 10*3/uL (ref 1.7–7.7)
Neutrophils Relative %: 49 %
Platelets: 352 10*3/uL (ref 150–400)
RBC: 4.99 MIL/uL (ref 4.22–5.81)
RDW: 19.3 % — ABNORMAL HIGH (ref 11.5–15.5)
WBC: 6.7 10*3/uL (ref 4.0–10.5)
nRBC: 0 % (ref 0.0–0.2)

## 2023-08-27 LAB — MAGNESIUM: Magnesium: 2.5 mg/dL — ABNORMAL HIGH (ref 1.7–2.4)

## 2023-08-27 LAB — COMPREHENSIVE METABOLIC PANEL WITH GFR
ALT: 11 U/L (ref 0–44)
AST: 21 U/L (ref 15–41)
Albumin: 3.6 g/dL (ref 3.5–5.0)
Alkaline Phosphatase: 47 U/L (ref 38–126)
Anion gap: 10 (ref 5–15)
BUN: 15 mg/dL (ref 8–23)
CO2: 22 mmol/L (ref 22–32)
Calcium: 9.2 mg/dL (ref 8.9–10.3)
Chloride: 107 mmol/L (ref 98–111)
Creatinine, Ser: 1.27 mg/dL — ABNORMAL HIGH (ref 0.61–1.24)
GFR, Estimated: 57 mL/min — ABNORMAL LOW (ref >60)
Glucose, Bld: 106 mg/dL — ABNORMAL HIGH (ref 70–99)
Potassium: 3.9 mmol/L (ref 3.5–5.1)
Sodium: 139 mmol/L (ref 135–145)
Total Bilirubin: 0.7 mg/dL (ref 0.0–1.2)
Total Protein: 8.1 g/dL (ref 6.5–8.1)

## 2023-08-27 LAB — TROPONIN I (HIGH SENSITIVITY): Troponin I (High Sensitivity): 9 ng/L (ref <18)

## 2023-08-27 LAB — LIPASE, BLOOD: Lipase: 44 U/L (ref 11–51)

## 2023-08-27 MED ORDER — ONDANSETRON HCL 4 MG/2ML IJ SOLN
4.0000 mg | Freq: Four times a day (QID) | INTRAMUSCULAR | Status: DC | PRN
Start: 2023-08-27 — End: 2023-09-03
  Administered 2023-09-01: 4 mg via INTRAVENOUS
  Filled 2023-08-27: qty 2

## 2023-08-27 MED ORDER — FONDAPARINUX SODIUM 2.5 MG/0.5ML ~~LOC~~ SOLN
2.5000 mg | SUBCUTANEOUS | Status: DC
  Filled 2023-08-27 (×4): qty 0.5

## 2023-08-27 MED ORDER — PANTOPRAZOLE SODIUM 40 MG IV SOLR
40.0000 mg | Freq: Once | INTRAVENOUS | Status: AC
  Administered 2023-08-27: 40 mg via INTRAVENOUS
  Filled 2023-08-27: qty 10

## 2023-08-27 MED ORDER — FONDAPARINUX SODIUM 2.5 MG/0.5ML ~~LOC~~ SOLN: 2.5000 mg | SUBCUTANEOUS | Status: DC

## 2023-08-27 MED ORDER — ONDANSETRON HCL 4 MG/2ML IJ SOLN
4.0000 mg | Freq: Once | INTRAMUSCULAR | Status: AC
  Administered 2023-08-27: 4 mg via INTRAVENOUS
  Filled 2023-08-27: qty 2

## 2023-08-27 MED ORDER — ENOXAPARIN SODIUM 40 MG/0.4ML IJ SOSY: 40.0000 mg | PREFILLED_SYRINGE | INTRAMUSCULAR | Status: DC

## 2023-08-27 MED ORDER — SODIUM CHLORIDE 0.9% FLUSH
3.0000 mL | Freq: Two times a day (BID) | INTRAVENOUS | Status: DC
  Administered 2023-08-27 – 2023-09-03 (×11): 3 mL via INTRAVENOUS

## 2023-08-27 MED ORDER — SODIUM CHLORIDE 0.9 % IV BOLUS
500.0000 mL | Freq: Once | INTRAVENOUS | Status: AC
  Administered 2023-08-27: 500 mL via INTRAVENOUS

## 2023-08-27 MED ORDER — IOHEXOL 300 MG/ML  SOLN
100.0000 mL | Freq: Once | INTRAMUSCULAR | Status: AC | PRN
  Administered 2023-08-27: 100 mL via INTRAVENOUS

## 2023-08-27 MED ORDER — ACETAMINOPHEN 325 MG PO TABS
650.0000 mg | ORAL_TABLET | Freq: Four times a day (QID) | ORAL | Status: DC | PRN
  Administered 2023-09-01: 650 mg via ORAL
  Filled 2023-08-27: qty 2

## 2023-08-27 MED ORDER — POLYETHYLENE GLYCOL 3350 17 G PO PACK: 17.0000 g | PACK | Freq: Every day | ORAL | Status: DC | PRN

## 2023-08-27 MED ORDER — ACETAMINOPHEN 650 MG RE SUPP
650.0000 mg | Freq: Four times a day (QID) | RECTAL | Status: DC | PRN
  Filled 2023-08-27: qty 1

## 2023-08-27 MED ORDER — PANTOPRAZOLE SODIUM 40 MG IV SOLR
40.0000 mg | Freq: Two times a day (BID) | INTRAVENOUS | Status: DC
  Administered 2023-08-27 – 2023-09-03 (×12): 40 mg via INTRAVENOUS
  Filled 2023-08-27 (×11): qty 10

## 2023-08-27 MED ORDER — DEXTROSE IN LACTATED RINGERS 5 % IV SOLN: INTRAVENOUS | Status: DC

## 2023-08-27 NOTE — Telephone Encounter (Signed)
 Patient's daughter returning phone call. Please advise, thank you.

## 2023-08-27 NOTE — Telephone Encounter (Signed)
 Please call PT's daughter Herbert Seta [phone]

## 2023-08-27 NOTE — Telephone Encounter (Signed)
 Do you have a name of the person you spoke with at [facility]?

## 2023-08-27 NOTE — Assessment & Plan Note (Signed)
 See hpi , 2 weeks ago. Echo on file from December - known CHF reduced EF 35-40%, RWM abnormalities. 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 Was exerting at the time. Keep on telemetry.

## 2023-08-27 NOTE — Assessment & Plan Note (Signed)
 See CT above. Will need deferred triple phase CT due to contrast admin today already.

## 2023-08-27 NOTE — Telephone Encounter (Signed)
 Copied from CRM [id number]. Topic: General - Transportation >> Aug 26, 2023  3:23 PM Shelah Lewandowsky wrote: Reason for CRM: questions about transportation through [facility] for his procedure  date  please call [phone]

## 2023-08-27 NOTE — Telephone Encounter (Signed)
 Left message on machine to call back

## 2023-08-27 NOTE — Patient Instructions (Addendum)
 Visit Information  Thank you for taking time to visit with me today. Please don't hesitate to contact me if I can be of assistance to you.    Chris [name] please call your [facility] customer service number [phone] to ask about your transportation benefit to medical appointments. Also you will be receiving a call from a care guide to provider other transportation resources    Following are the goals we discussed today:   Goals Addressed             This Visit's Progress    Find Help in My Community       Timeframe:  Short-Term Goal Priority:  Medium Start Date:      08/27/23                       Expected End Date:      09/03/23/or after call from care guide                  Follow Up Date n/a/n/a/n/a    - follow-up on any referrals for help I am given - have a back-up plan - make a list of family or friends that I can call    Notes: pcp nurse outreached after patient messaged her about need for transportation resources  RN CM encouraged use of his [facility] medicare customer service number [phone] to get information about his transportation benefit Referred to care guide for other resources         Our next appointment is no further scheduled appointments.  on n/a at n/a  Please call the care guide team at [phone] if you need to cancel or reschedule your appointment.   If you are experiencing a Mental Health or Behavioral Health Crisis or need someone to talk to, please call the Suicide and Crisis Lifeline: 988 call the [place] National Suicide Prevention Lifeline: (709)871-5078 or TTY: 782-482-6272 TTY 8164330804) to talk to a trained counselor call 1-800-273-TALK (toll free, 24 hour hotline) go to [facility] [address] [place] [phone]) call 911   Patient verbalizes understanding of instructions and care plan provided today and agrees to view in MyChart. Active MyChart status and patient understanding of how to access  instructions and care plan via MyChart confirmed with patient.     The patient has been provided with contact information for the care management team and has been advised to call with any health related questions or concerns.   Monnica Saltsman L. Noelle Penner, RN, BSN, CCM [place]  [facility], [facility] RN Care Manager Direct Dial: [phone]  Fax: [phone]

## 2023-08-27 NOTE — Patient Outreach (Signed)
 Care Coordination   Collaboration with pcp nurse/transportation  Visit Note   08/27/2023 Name: Chris [name]. MRN: [id number] DOB: 04/22/1941  Chris [name]. is a 83 y.o. year old male who sees [name], [name], MD for primary care. I  spoke with Germaine Pomfret nurse of Dr Tanya Nones after patient messaged her about transportation resources  What matters to the patients health and wellness today?  Transportation     Goals Addressed             This Visit's Progress    Find Help in My Community       Timeframe:  Short-Term Goal Priority:  Medium Start Date:      08/27/23                       Expected End Date:      09/03/23/or after call from care guide                  Follow Up Date n/a/n/a/n/a    - follow-up on any referrals for help I am given - have a back-up plan - make a list of family or friends that I can call    Notes: pcp nurse outreached after patient messaged her about need for transportation resources  RN CM encouraged use of his [employer] customer service number [phone] to get information about his transportation benefit Referred to care guide for other resources         SDOH assessments and interventions completed:  Yes  SDOH Interventions Today    Flowsheet Row Most Recent Value  SDOH Interventions   Transportation Interventions Community Resources Provided, [id number] Referral, Contracted Vendor  [encouraged contact with [employer] medicare # [id number]]  Health Literacy Interventions Intervention Not Indicated        Care Coordination Interventions:  Yes, provided   Follow up plan: No further intervention required.   Encounter Outcome:  Patient Visit Completed   Cala Bradford L. Noelle Penner, RN, BSN, CCM [place]  [facility], [facility] RN Care Manager Direct Dial: [phone]  Fax: [phone]

## 2023-08-27 NOTE — Telephone Encounter (Signed)
 Sorry for late response. I think her name was Lynden Ang

## 2023-08-27 NOTE — Telephone Encounter (Signed)
 Tried to call pt to advise. Pt states he is currently being admitted to the hospital due to hernia pain and dehydration. Will call pt back later with the [facility] information, if he still needs it. Mjp,lpn  Per Edd Arbour, RN: A care guide will call him or leave a message. He can also call his [employer] customer service [phone] to ask for a medical ride.

## 2023-08-27 NOTE — Telephone Encounter (Signed)
 [facility] is calling about an urgent referral for EGD/EM. He is getting worse and they really would like for [place] to speak with the daughter about his symptoms and to get him scheduled with Dr. Meridee Score as soon as possible.

## 2023-08-27 NOTE — Progress Notes (Addendum)
 Patient ID: Chris [name]., male   DOB: 1941-05-20, 83 y.o.   MRN: [id number]  This gentleman is known to our group having just seen Dr. Michaell Cowing in the office 3 days ago.  He currently reports no abdominal pain but is afraid to take anything p.o. secondary to the discomfort he has been having.  He is currently nontender on physical examination. I have reviewed his CT scan and labs  Below I have listed Dr. Gordy Savers assessment and plan which are as follows  "83 year old male with known hiatal hernia for the past decade that is gotten larger and now is chronically volvulized with intolerance to solids, chest pain and vomiting, multiple ER visits, unintentional weight loss of 20 pounds.  I think he is getting a point where he cannot avoid surgery to correct this. He is visiting the ER, not tolerate solids, and now has unintentional weight loss. I worry he will become completely obstructed, get admitted, and require emergent repair which will be more risky. Have to wait for the Plavix to wear off. Technically feasible to do a robotic approach with reduction. Repair possible mesh reinforcement. Most likely a partial Toupet fundoplication. The anatomy & physiology of the foregut and anti-reflux mechanism was discussed. The pathophysiology of hiatal herniation and GERD was discussed. Natural history risks without surgery was discussed. The patient's symptoms are not adequately controlled by medicines and other non-operative treatments. I feel the risks of no intervention will lead to serious problems that outweigh the operative risks; therefore, I recommended surgery to reduce the hiatal hernia out of the chest and fundoplication to rebuild the anti-reflux valve and control reflux better. Need for a thorough workup to rule out the differential diagnosis and plan treatment was explained. I explained minimally invasive techniques with possible need for an open approach. With his history of coronary disease and  chronic anticoagulation and decreased ejection fraction, his operative risks are obviously increased. However he passes the eyeball test to me. Moving around rather independently. Cardiology is aware and I think they are the ones referred to [place] but I do not see any definite cardiac clearance. Looks like primary care physician worried about surgery unless it is really needed. I think it is really needed. Looks like the nurse practitioner Cathe Mons has been managing him a lot. Last note mentions referral to heart failure clinic. We definitely need clearance first. While his ejection fraction has been in the 40s and reduced, he has somewhat decent performance status so I do not think it is severely prohibitive. Obviously we want to try and do this with minimizing the risks.  I think it would be wise for him to get at least an EGD by gastroenterology to make sure there is no other reason for his obstruction or problems. Looks like they tried to range this a few years ago and that fell through. Dr. Allegra Lai in [place] had offered to be available but I think the patient wishes to revisit [facility]. Will try and set up an appointment to make that happen.  Standard of care is to get esophageal manometry preoperatively to make sure there are no other esophageal dysmotility issues. Gives a sense if he can tolerate this"    Plan:  We would only operate this weekend if he became acutely ill with peritonitis as this would require open surgery and neither I nor my partner on this weekend does robotic or laparoscopic hiatal hernia repair.    We will continue to see him over  the weekend.  Our acute care surgeon will be here next week as Dr. Derrell Lolling who also does hiatal hernia surgery robotically.  We will also notify Dr. Michaell Cowing of the patient's admission on Monday.  Moderate medical decision making

## 2023-08-27 NOTE — Assessment & Plan Note (Addendum)
 Previously known, this is felt to be symptomatic and principal reason for admission today.  Patient is fortunately having good control of symptoms after ER Rx as above.  However patient has had multiple hospitalization with symptoms of nausea vomiting chest and abdominal area pain.  Troponin is negative, no hypoxemia.  No lipase elevation.  WBC not actionable.  And electrolytes largely within normal limits.  No acidosis.  See GI evaluation in chart.  Will keep n.p.o. with IV fluids.  I have engaged Dr. Magnus Ivan of surgery.  He advised that ideally patient should stay off Plavix and get procedure done as an inpatient by Dr. Vickii Chafe after the weekend.  They will however follow along the patient and, if their hand is forced, operate as needed sooner. C/w pantop and zofran.

## 2023-08-27 NOTE — ED Triage Notes (Signed)
 Pt states he is unable to swallow since last night, he drank water and he began having pain after swallowing it. States he has a hiatal hernia that he is supposed to have surgery on.

## 2023-08-27 NOTE — Telephone Encounter (Signed)
 Sent it to the surgeon and it is found in the letter section

## 2023-08-27 NOTE — Telephone Encounter (Signed)
 I sent the letter, by the way he is in the ED for same hiatal hernia issue

## 2023-08-27 NOTE — Consult Note (Addendum)
 Referring Provider: Dr. Elayne Snare  Primary Care Physician:  Donita Brooks, MD Primary Gastroenterologist:  Dr. Meridee Score   Reason for Consultation:  Large hiatal hernia   HPI: Chris [name]. is a 82 y.o. 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  Past umbilical hernia repair.  He has a known history of a large hiatal hernia with chronic volvulus with intermittent chest, epigastric pain and vomiting which has progressively worsened over the past few weeks multiple trips to [facility] ED Jan - March 2025, hospital admission was offered 08/11/2023 for surgical evaluation which he declined at that time. CTAP 08/11/2023 showed a large hiatal hernia containing the majority of the stomach without gastric outlet obstruction. He subsequently underwent general surgery consult as an outpatient by Dr. Karie Soda on 08/24/2023 who recommended an EGD to rule out any other etiology for his obstructive GI symptoms prior to pursuing hiatal hernia surgery.  He presented to the ED today as he has been unable to drink any liquids or eat any food for the past 2 days.  He describes having excruciating pain to the mid esophageal/chest area anytime he eats food or drinks water.  He has lost 20 pounds over the past few months.  He has a history of GERD but denies ever undergoing an EGD.  He does not take any acid reducing medications at home.  He has heartburn after vomiting.  He vomits several times weekly and sometimes vomits every time he eats.  No coffee-ground or hematemesis.  No bloody or black stools.  He is on Plavix, last dose was taken 3 to 4 days ago.  Denies having any chest pain or  shortness of breath at this time. He is by himself, no family members present.  He is mourning the loss of his wife who died 06-10-23.  Echocardiogram 10/15/2021:  Normal LV systolic function with visual EF 55-60%. Left ventricle cavity  is normal in size. Normal left ventricular wall thickness. Normal global  wall motion. Abnormal septal wall motion due to post-operative septum.  Normal diastolic filling pattern, normal LAP.  Pericardium is normal. Insignificant pericardial effusion. There is no hemodynamic significance.  Compared to 11/06/2020 LVEF 35-40% and RWMA have now improved, otherwise no significant change.   Past Medical History:  Diagnosis Date   Abnormal exercise myocardial perfusion study    03/2018- 38% ef, see report   Arthritis    Chronic kidney disease    Congestive heart failure (CHF) (HCC) 09/2015   Coronary artery disease    DOE (dyspnea on exertion)    ED (erectile dysfunction)    GERD (gastroesophageal reflux disease)    occ   Gout    H/O hiatal hernia    Hyperlipidemia    Hypertension    Myocardial infarction (HCC)    Neuropathy    toes   Obesity    RBBB (right bundle branch block)    Stenosis of right internal carotid artery    Type II diabetes mellitus (HCC)    Umbilical hernia    a. s/p repair.   Vertigo     Past Surgical History:  Procedure Laterality Date   ANKLE ARTHROSCOPY WITH DRILLING/MICROFRACTURE Left  04/13/2013   Procedure: LEFT ANKLE ARTHROSCOPY WITH EXTENSIVE DEBRIEDMENT;  Surgeon: Toni Arthurs, MD;  Location: [facility];  Service: Orthopedics;  Laterality: Left;   ANKLE SURGERY Left 08/18/2013   DR HEWITT   CARDIAC CATHETERIZATION  2000   stents x3   CARDIAC CATHETERIZATION  02/15/2014   Procedure: LEFT HEART CATH AND CORS/GRAFTS ANGIOGRAPHY;  Surgeon: Pamella Pert, MD;  Location: [facility] CATH LAB;  Service: Cardiovascular;;   COLONOSCOPY     CORONARY ARTERY BYPASS GRAFT  1995   LIMA to LAD, SVG to RCA, SVG to  OM.    CORONARY STENT INTERVENTION N/A 08/12/2020   Procedure: CORONARY STENT INTERVENTION;  Surgeon: Elder Negus, MD;  Location: [facility] INVASIVE CV LAB;  Service: Cardiovascular;  Laterality: N/A;   CORONARY STENT INTERVENTION N/A 09/04/2021   Procedure: CORONARY STENT INTERVENTION;  Surgeon: Yates Decamp, MD;  Location: [facility] INVASIVE CV LAB;  Service: Cardiovascular;  Laterality: N/A;   LEFT HEART CATH AND CORS/GRAFTS ANGIOGRAPHY N/A 08/12/2020   Procedure: LEFT HEART CATH AND CORS/GRAFTS ANGIOGRAPHY;  Surgeon: Elder Negus, MD;  Location: [facility]VAS[facility] CV LAB;  Service: Cardiovascular;  Laterality: N/A;   LEFT HEART CATH AND CORS/GRAFTS ANGIOGRAPHY N/A 09/04/2021   Procedure: LEFT HEART CATH AND CORS/GRAFTS ANGIOGRAPHY;  Surgeon: Yates Decamp, MD;  Location: [facility]VAS[facility] CV LAB;  Service: Cardiovascular;  Laterality: N/A;   RIGHT/LEFT HEART CATH AND CORONARY/GRAFT ANGIOGRAPHY N/A 07/05/2023   Procedure: RIGHT/LEFT HEART CATH AND CORONARY/GRAFT ANGIOGRAPHY;  Surgeon: Yates Decamp, MD;  Location: [facility] CV LAB;  Service: Cardiovascular;  Laterality: N/A;   TOTAL ANKLE ARTHROPLASTY Left 08/17/2013   Procedure: LEFT TOTAL ANKLE REPLACEMENT WITH POSSIBLE GASTROC RECESSION ;  Surgeon: Toni Arthurs, MD;  Location: [facility] OR;  Service: Orthopedics;  Laterality: Left;   UMBILICAL HERNIA REPAIR  12/2007    Prior to Admission medications   Medication Sig Start Date End Date Taking? Authorizing Provider  acetaminophen (TYLENOL) 500 MG tablet Take 500 mg by mouth every 6 (six) hours as needed for mild pain.    [provider]  bisacodyl (DULCOLAX) 5 MG EC tablet Take 1 tablet (5 mg total) by mouth at bedtime as needed for moderate constipation. 06/25/23   Gillis Santa, MD  Blood Glucose Monitoring Suppl DEVI 1 each by Does not apply route in the morning, at noon, and at bedtime. May substitute to any manufacturer covered by patient's insurance. 11/16/22   Donita Brooks, MD  cetirizine (ZYRTEC  ALLERGY) 10 MG tablet Take 1 tablet (10 mg total) by mouth daily. 07/19/23   Charlsie Quest, NP  clopidogrel (PLAVIX) 75 MG tablet TAKE 1 TABLET EVERY DAY 11/23/22   Donita Brooks, MD  cyanocobalamin (VITAMIN B12) 1000 MCG tablet Take 1 tablet (1,000 mcg total) by mouth daily. 07/20/23 07/14/24  Donita Brooks, MD  empagliflozin (JARDIANCE) 10 MG TABS tablet Take 1 tablet (10 mg total) by mouth daily before breakfast. Patient not taking: Reported on 07/28/2023 07/22/23   Donita Brooks, MD  furosemide (LASIX) 40 MG tablet Take 1 tablet (40 mg total) by mouth daily. 07/28/23 10/26/23  Delma Freeze, FNP  iron polysaccharides (NIFEREX) 150 MG capsule Take 1 capsule (150 mg total) by mouth daily. Patient not taking: Reported on 06/29/2023 06/25/23 09/23/23  Gillis Santa, MD  nitroGLYCERIN (NITROSTAT) 0.4 MG SL tablet Place 1 tablet (0.4 mg total) under the tongue every 5 (five) minutes as needed for chest pain. 04/06/22   Donita Brooks, MD  pantoprazole (PROTONIX) 40 MG tablet Take 1 tablet (40 mg total) by mouth daily. 05/31/23   Donita Brooks, MD  rosuvastatin (CRESTOR) 40 MG tablet TAKE 1 TABLET EVERY DAY 06/10/23   Yates Decamp, MD  spironolactone (ALDACTONE) 25 MG tablet Take 0.5 tablets (12.5 mg total) by mouth daily. 07/28/23 10/26/23  Delma Freeze, FNP  sucralfate (CARAFATE) 1 g tablet TAKE 1 TABLET BY MOUTH FOUR TIMES DAILY WITH MEALS AND AT BEDTIME Patient not taking: No sig reported 05/12/23   Donita Brooks, MD  traZODone (DESYREL) 50 MG tablet Take 0.5 tablets (25 mg total) by mouth at bedtime as needed for sleep. 06/22/23   Park Meo, FNP    No current facility-administered medications for this encounter.   Current Outpatient Medications  Medication Sig Dispense Refill   acetaminophen (TYLENOL) 500 MG tablet Take 500 mg by mouth every 6 (six) hours as needed for mild pain.     bisacodyl (DULCOLAX) 5 MG EC tablet Take 1 tablet (5 mg total) by mouth at bedtime as needed for  moderate constipation. 100 tablet 0   Blood Glucose Monitoring Suppl DEVI 1 each by Does not apply route in the morning, at noon, and at bedtime. May substitute to any manufacturer covered by patient's insurance. 1 each 0   cetirizine (ZYRTEC ALLERGY) 10 MG tablet Take 1 tablet (10 mg total) by mouth daily. 90 tablet 1   clopidogrel (PLAVIX) 75 MG tablet TAKE 1 TABLET EVERY DAY 90 tablet 3   cyanocobalamin (VITAMIN B12) 1000 MCG tablet Take 1 tablet (1,000 mcg total) by mouth daily. 90 tablet 3   empagliflozin (JARDIANCE) 10 MG TABS tablet Take 1 tablet (10 mg total) by mouth daily before breakfast. (Patient not taking: Reported on 07/28/2023) 90 tablet 3   furosemide (LASIX) 40 MG tablet Take 1 tablet (40 mg total) by mouth daily. 90 tablet 3   iron polysaccharides (NIFEREX) 150 MG capsule Take 1 capsule (150 mg total) by mouth daily. (Patient not taking: Reported on 06/29/2023) 30 capsule 2   nitroGLYCERIN (NITROSTAT) 0.4 MG SL tablet Place 1 tablet (0.4 mg total) under the tongue every 5 (five) minutes as needed for chest pain. 15 tablet 3   pantoprazole (PROTONIX) 40 MG tablet Take 1 tablet (40 mg total) by mouth daily. 90 tablet 1   rosuvastatin (CRESTOR) 40 MG tablet TAKE 1 TABLET EVERY DAY 90 tablet 3   spironolactone (ALDACTONE) 25 MG tablet Take 0.5 tablets (12.5 mg total) by mouth daily. 45 tablet 3   sucralfate (CARAFATE) 1 g tablet TAKE 1 TABLET BY MOUTH FOUR TIMES DAILY WITH MEALS AND AT BEDTIME (Patient not taking: No sig reported) 360 tablet 0   traZODone (DESYREL) 50 MG tablet Take 0.5 tablets (25 mg total) by mouth at bedtime as needed for sleep. 30 tablet 0    Allergies as of 08/27/2023 - Review Complete 08/27/2023  Allergen Reaction Noted   Brilinta [ticagrelor] Shortness Of Breath 06/09/2018   Alpha-gal Hives 05/07/2021   Crestor [rosuvastatin calcium] Other (See Comments) 06/09/2018   Lipitor [atorvastatin] Other (See Comments) 12/12/2013    Family History  Problem Relation  Age of Onset   Aneurysm Mother    Heart disease Father    Heart attack Father    Stroke Brother     Social History   Socioeconomic History   Marital status: Widowed    Spouse name: Not on file   Number of children: 4   Years of education: Not on  file   Highest education level: Not on file  Occupational History   Not on file  Tobacco Use   Smoking status: Former    Current packs/day: 0.00    Average packs/day: 1 pack/day for 9.0 years (9.0 ttl pk-yrs)    Types: Cigarettes    Start date: 10/30/1961    Quit date: 10/31/1970    Years since quitting: 52.8   Smokeless tobacco: Former    Types: Chew   Tobacco comments:    chewed for 2 years in his 20's  Vaping Use   Vaping status: Never Used  Substance and Sexual Activity   Alcohol use: Not Currently    Comment: occ wine last 6 months   Drug use: No   Sexual activity: Yes  Other Topics Concern   Not on file  Social History Narrative   Lives in [place] with wife.  Retired.   Social Drivers of Corporate investment banker Strain: Low Risk  (05/14/2022)   Overall Financial Resource Strain (CARDIA)    Difficulty of Paying Living Expenses: Not hard at all  Food Insecurity: No Food Insecurity (06/25/2023)   Hunger Vital Sign    Worried About Running Out of Food in the Last Year: Never true    Ran Out of Food in the Last Year: Never true  Transportation Needs: Unmet Transportation Needs (08/27/2023)   PRAPARE - Administrator, Civil Service (Medical): Yes    Lack of Transportation (Non-Medical): No  Physical Activity: Insufficiently Active (05/14/2022)   Exercise Vital Sign    Days of Exercise per Week: 3 days    Minutes of Exercise per Session: 30 min  Stress: No Stress Concern Present (05/14/2022)   [employer] of Occupational Health - Occupational Stress Questionnaire    Feeling of Stress : Only a little  Social Connections: Moderately Integrated (06/25/2023)   Social Connection and Isolation Panel  [NHANES]    Frequency of Communication with Friends and Family: More than three times a week    Frequency of Social Gatherings with Friends and Family: More than three times a week    Attends Religious Services: More than 4 times per year    Active Member of [employer] or Organizations: Yes    Attends Banker Meetings: More than 4 times per year    Marital Status: Widowed  Intimate Partner Violence: Not At Risk (06/25/2023)   Humiliation, Afraid, Rape, and Kick questionnaire    Fear of Current or Ex-Partner: No    Emotionally Abused: No    Physically Abused: No    Sexually Abused: No   Review of Systems: Gen: Denies fever, sweats or chills. No weight loss.  CV: Denies chest pain, palpitations or edema. Resp: Denies cough, shortness of breath of hemoptysis.  GI: See HPI. GU : Denies urinary burning, blood in urine, increased urinary frequency or incontinence. MS: Denies joint pain, muscles aches or weakness. Derm: Denies rash, itchiness, skin lesions or unhealing ulcers. Psych: + Depression, mild memory impairment.  Heme: Denies easy bruising, bleeding. Neuro:  Denies headaches, dizziness or paresthesias. Endo: Diabetes mellitus type II.  Physical Exam: Vital signs in last 24 hours: Temp:  [97.9 F (36.6 C)] 97.9 F (36.6 C) (04/04 1201) Pulse Rate:  [52-77] 53 (04/04 1300) Resp:  [15-21] 15 (04/04 1300) BP: (91-114)/(64-77) 114/66 (04/04 1300) SpO2:  [96 %-100 %] 100 % (04/04 1300) Weight:  [91.2 kg] 91.2 kg (04/04 1205)   General: 83 year old male in no acute distress.  Head:  Normocephalic and atraumatic. Eyes:  No scleral icterus. Conjunctiva pink. Ears:  Normal auditory acuity. Nose:  No deformity, discharge or lesions. Mouth:  Dentition intact. No ulcers or lesions.  Neck:  Supple. No lymphadenopathy or thyromegaly.  Lungs: Breath sounds clear with good bibasilar crackles. Heart: Rate and rhythm, no murmurs. Abdomen: Obese abdomen, soft.  Nondistended.   Tenderness above the epigastrium without rebound or guarding.  No lower abdominal tenderness.  Positive bowel sounds all 4 quadrants. Rectal: Deferred. Musculoskeletal:  Symmetrical without gross deformities.  Pulses:  Normal pulses noted. Extremities: No clubbing or edema. Neurologic:  Alert and  oriented x 4. No focal deficits.  Skin:  Intact without significant lesions or rashes. Psych:  Alert and cooperative. Normal mood and affect.  Intake/Output from previous day: No intake/output data recorded. Intake/Output this shift: No intake/output data recorded.  Lab Results: Recent Labs    08/27/23 1253  WBC 6.7  HGB 10.2*  HCT 36.3*  PLT 352   BMET Recent Labs    08/27/23 1253  NA 139  K 3.9  CL 107  CO2 22  GLUCOSE 106*  BUN 15  CREATININE 1.27*  CALCIUM 9.2   LFT Recent Labs    08/27/23 1253  PROT 8.1  ALBUMIN 3.6  AST 21  ALT 11  ALKPHOS 47  BILITOT 0.7   PT/INR No results for input(s): "LABPROT", "INR" in the last 72 hours. Hepatitis Panel No results for input(s): "HEPBSAG", "HCVAB", "HEPAIGM", "HEPBIGM" in the last 72 hours.    Studies/Results: DG Chest 2 View Result Date: 08/27/2023 CLINICAL DATA:  Chest pain.  Difficulty swallowing. EXAM: CHEST - 2 VIEW COMPARISON:  Chest radiograph dated 06/24/2023. FINDINGS: The heart size and mediastinal contours are unchanged. Prior median sternotomy and CABG. Large hiatal hernia again noted. Bilateral basilar predominant chronic interstitial changes are again noted. No appreciable new focal consolidation, pleural effusion, or pneumothorax. No acute osseous abnormality. IMPRESSION: 1. 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 2. Similar large hiatal hernia. Electronically Signed   By: Hart Robinsons M.D.   On: 08/27/2023 13:35    IMPRESSION/PLAN:  83 year old male with a large hiatal hernia with chronic volvulus with progressive esophageal and epigastric pain with N/V and poor oral intake.  Unable to eat or drink fluids x 2 days. Recently seen by general surgery as an outpatient with plans for future hiatal hernia surgery with fundoplication. - NPO - IV fluids and pain management per the hospitalist - Ondansetron 4 mg IV every 6 hours as needed - Pantoprazole 40 mg IV twice daily - CTAP with oral and IV contrast to reassess hiatal hernia to rule out gastric volvulus/gastric outlet obstruction - Endoscopic evaluation deferred - General Surgery consult as an inpatient -Further recommendations per Dr. Marina Goodell  History of coronary artery disease s/p MI s/p CABG and multiple PCIs. Last dose of Plavix was 3 or 4 days ago.   Chronic diastolic CHF. LVEF 55- 60% per ECHO 09/2021.   Carotid artery disease, past CVA  CKD stage IIIb  DM type II  Arnaldo Natal  08/27/2023, 4:27 PM  GI ATTENDING  History, laboratories, x-rays all personally reviewed.  Patient personally seen and examined along with the GI nurse practitioner in the emergency room.  Agree with comprehensive consultation note, impressions, and recommendations as scribed (for me) above.  Impression and plan: Large hiatal hernia with paraesophageal component.  Recurrent problems with obstructive symptoms related to the same.  He was anticipating surgical repair.  Now  presents to the hospital with difficulty eating and drinking.  No significant pain.  Abdominal exam benign.  To be admitted.  At this point I would proceed with repeat contrast CT scan to rule out obstruction from his hernia.  If present, then I recommend NG tube decompression.  If no evidence of obstruction, then a decision to be made regarding timing of surgery.  Recommend surgical evaluation (recently seen by Dr. Michaell Cowing) today.  Surgery may request a preoperative upper endoscopy to rule out any other meaningful concurrent pathology.  GI will follow.  Thanks.  Wilhemina Bonito. Eda Keys., M.D. [facility] Division of Gastroenterology

## 2023-08-27 NOTE — ED Provider Notes (Signed)
 [place] EMERGENCY DEPARTMENT AT [facility] Provider Note   CSN: [id number] Arrival date & time: 08/27/23  1156     History  Chief Complaint  Patient presents with   Dysphagia    Chris [name]. is a 83 y.o. male.  Patient is an 83 year old male with a past medical history of large hiatal hernia with chronic volvulus, CHF, CAD, pulmonary fibrosis presenting to the emergency department with chest pain and nausea.  Patient states that he is been in and out of the hospital multiple times with pain related to his hernia.  He states that he recently found that he could eat fish with mashed potatoes and applesauce which she has been tolerating for about the last 2 weeks.  He states after having this 2 days ago his pain returned.  He states that he has not ate or drink anything since then and has not been able to take any of his pills.  He states that last night he tried to drink some water and the pain returned.  He states that he was unable to sleep last night due to the pain and was nauseous and dry heaving all night.  He states that the pain is somewhat improved this morning.  He states that he did see his surgeon yesterday and is pending further evaluation by GI before he can be cleared for surgery.  The history is provided by the patient.       Home Medications Prior to Admission medications   Medication Sig Start Date End Date Taking? Authorizing Provider  acetaminophen (TYLENOL) 500 MG tablet Take 500 mg by mouth every 6 (six) hours as needed for mild pain.    [provider]  bisacodyl (DULCOLAX) 5 MG EC tablet Take 1 tablet (5 mg total) by mouth at bedtime as needed for moderate constipation. 06/25/23   Gillis Santa, MD  Blood Glucose Monitoring Suppl DEVI 1 each by Does not apply route in the morning, at noon, and at bedtime. May substitute to any manufacturer covered by patient's insurance. 11/16/22   Donita Brooks, MD  cetirizine (ZYRTEC ALLERGY) 10 MG  tablet Take 1 tablet (10 mg total) by mouth daily. 07/19/23   Charlsie Quest, NP  clopidogrel (PLAVIX) 75 MG tablet TAKE 1 TABLET EVERY DAY 11/23/22   Donita Brooks, MD  cyanocobalamin (VITAMIN B12) 1000 MCG tablet Take 1 tablet (1,000 mcg total) by mouth daily. 07/20/23 07/14/24  Donita Brooks, MD  empagliflozin (JARDIANCE) 10 MG TABS tablet Take 1 tablet (10 mg total) by mouth daily before breakfast. Patient not taking: Reported on 07/28/2023 07/22/23   Donita Brooks, MD  furosemide (LASIX) 40 MG tablet Take 1 tablet (40 mg total) by mouth daily. 07/28/23 10/26/23  Delma Freeze, FNP  iron polysaccharides (NIFEREX) 150 MG capsule Take 1 capsule (150 mg total) by mouth daily. Patient not taking: Reported on 06/29/2023 06/25/23 09/23/23  Gillis Santa, MD  nitroGLYCERIN (NITROSTAT) 0.4 MG SL tablet Place 1 tablet (0.4 mg total) under the tongue every 5 (five) minutes as needed for chest pain. 04/06/22   Donita Brooks, MD  pantoprazole (PROTONIX) 40 MG tablet Take 1 tablet (40 mg total) by mouth daily. 05/31/23   Donita Brooks, MD  rosuvastatin (CRESTOR) 40 MG tablet TAKE 1 TABLET EVERY DAY 06/10/23   Yates Decamp, MD  spironolactone (ALDACTONE) 25 MG tablet Take 0.5 tablets (12.5 mg total) by mouth daily. 07/28/23 10/26/23  Delma Freeze, FNP  sucralfate (CARAFATE) 1 g tablet TAKE 1 TABLET BY MOUTH FOUR TIMES DAILY WITH MEALS AND AT BEDTIME Patient not taking: No sig reported 05/12/23   Donita Brooks, MD  traZODone (DESYREL) 50 MG tablet Take 0.5 tablets (25 mg total) by mouth at bedtime as needed for sleep. 06/22/23   Park Meo, FNP      Allergies    Brilinta [ticagrelor], Alpha-gal, Crestor [rosuvastatin calcium], and Lipitor [atorvastatin]    Review of Systems   Review of Systems  Physical Exam Updated Vital Signs BP 121/73 (BP Location: Left Arm)   Pulse 67   Temp 97.8 F (36.6 C) (Oral)   Resp (!) 23   Ht 6' (1.829 m)   Wt 91.2 kg   SpO2 95%   BMI 27.26 kg/m  Physical  Exam Vitals and nursing note reviewed.  Constitutional:      General: He is not in acute distress.    Appearance: Normal appearance.  HENT:     Head: Normocephalic and atraumatic.     Nose: Nose normal.     Mouth/Throat:     Mouth: Mucous membranes are dry.     Pharynx: Oropharynx is clear.  Eyes:     Extraocular Movements: Extraocular movements intact.     Conjunctiva/sclera: Conjunctivae normal.  Cardiovascular:     Rate and Rhythm: Normal rate and regular rhythm.     Heart sounds: Normal heart sounds.  Pulmonary:     Effort: Pulmonary effort is normal.     Breath sounds: Normal breath sounds.  Abdominal:     General: Abdomen is flat.     Palpations: Abdomen is soft.     Tenderness: There is abdominal tenderness (Epigastric). There is no guarding or rebound.  Musculoskeletal:        General: Normal range of motion.     Cervical back: Normal range of motion.  Skin:    General: Skin is warm and dry.  Neurological:     General: No focal deficit present.     Mental Status: He is alert and oriented to person, place, and time.  Psychiatric:        Mood and Affect: Mood normal.        Behavior: Behavior normal.     ED Results / Procedures / Treatments   Labs (all labs ordered are listed, but only abnormal results are displayed) Labs Reviewed  COMPREHENSIVE METABOLIC PANEL WITH GFR - Abnormal; Notable for the following components:      Result Value   Glucose, Bld 106 (*)    Creatinine, Ser 1.27 (*)    GFR, Estimated 57 (*)    All other components within normal limits  CBC WITH DIFFERENTIAL/PLATELET - Abnormal; Notable for the following components:   Hemoglobin 10.2 (*)    HCT 36.3 (*)    MCV 72.7 (*)    MCH 20.4 (*)    MCHC 28.1 (*)    RDW 19.3 (*)    All other components within normal limits  MAGNESIUM - Abnormal; Notable for the following components:   Magnesium 2.5 (*)    All other components within normal limits  LIPASE, BLOOD  TROPONIN I (HIGH SENSITIVITY)     EKG EKG Interpretation Date/Time:  Friday August 27 2023 12:12:57 EDT Ventricular Rate:  68 PR Interval:  175 QRS Duration:  144 QT Interval:  482 QTC Calculation: 513 R Axis:   106  Text Interpretation: 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  Difficulty swallowing. EXAM: CHEST - 2 VIEW COMPARISON:  Chest radiograph dated 06/24/2023. FINDINGS: The heart size and mediastinal contours are unchanged. Prior median sternotomy and CABG. Large hiatal hernia again noted. Bilateral basilar predominant chronic interstitial changes are again noted. No appreciable new focal consolidation, pleural effusion, or pneumothorax. No acute osseous abnormality. IMPRESSION: 1. 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 2. Similar large hiatal hernia. Electronically Signed   By: Hart Robinsons M.D.   On: 08/27/2023 13:35    Procedures Procedures    Medications Ordered in ED Medications  sodium chloride 0.9 % bolus 500 mL (0 mLs Intravenous Stopped 08/27/23 1407)  pantoprazole (PROTONIX) injection 40 mg (40 mg Intravenous Given 08/27/23 1424)  ondansetron (ZOFRAN) injection 4 mg (4 mg Intravenous Given 08/27/23 1424)    ED Course/ Medical Decision Making/ A&P Clinical Course as of 08/27/23 1522  Fri Aug 27, 2023  1418 Cr at baseline, troponin negative. Symptoms ongoing since last night so single troponin is sufficient. Will discuss with GI for recommendations on management. [VK]  1508 I spoke with Chris Fusi PA with [facility]. Agree with admission for inability to tolerate PO and will evaluate him for likely needing inpatient endoscopy.  [VK]    Clinical Course User Index [VK] Rexford Maus, DO                                 Medical Decision Making This  patient presents to the ED with chief complaint(s) of N/V, abd pain with pertinent past medical history of large hiatal hernia, CAD, CHF, pulmonary fibrosis which further complicates the presenting complaint. The complaint involves an extensive differential diagnosis and also carries with it a high risk of complications and morbidity.    The differential diagnosis includes hiatal hernia, esophageal obstruction, gastritis, GERD, pancreatitis, hepatitis, ACS, esophageal rupture,  Additional history obtained: Additional history obtained from N/A Records reviewed Primary Care Documents and outpatient general surgery records  ED Course and Reassessment: On patient's arrival his blood pressure is soft but is otherwise hemodynamically stable in no acute distress.  EKG on arrival showed normal sinus rhythm without acute ischemic changes.  Patient will have labs to evaluate for dehydration or electrolyte derangement as well as ACS.  Will chest x-ray.  Will be started on gentle fluids and will be closely reassessed.  Independent labs interpretation:  The following labs were independently interpreted: within normal range  Independent visualization of imaging: - I independently visualized the following imaging with scope of interpretation limited to determining acute life threatening conditions related to emergency care: CXR, which revealed no acute disease  Consultation: - Consulted or discussed management/test interpretation w/ external professional: GI, hospitalist  Consideration for admission or further workup: patient requires admission for inability to tolerate PO in the setting of hiatal hernia Social Determinants of health: N/A    Amount and/or Complexity of Data Reviewed Labs: ordered. Radiology: ordered.  Risk Prescription drug management. Decision regarding hospitalization.          Final Clinical Impression(s) / ED Diagnoses Final diagnoses:  Hiatal hernia    Rx / DC  Orders ED Discharge Orders     None         Rexford Maus, DO 08/27/23 1522

## 2023-08-27 NOTE — H&P (Addendum)
 History and Physical    Patient: Chris [name]. ZOX:[id number] DOB: 12-Jun-1940 DOA: 08/27/2023 DOS: the patient was seen and examined on 08/27/2023 PCP: Donita Brooks, MD  Patient coming from: Home  Chief Complaint:  Chief Complaint  Patient presents with   Dysphagia   HPI: Chris [name]. is a 83 y.o. male with medical history significant of known hiatal hernia. Subacute/chornic epigastric/chest gnawing pain, nasuea and vomting and food/liquid intolerance for about 2 weks or longers. With at least 4 ER visits for same with worsenig sympstims X 48 hours. Last nght patinet describes having succesfully swallowed half cup of water. And then spending hours trying to vomit it back out without success.  No fever, no diahrea, passing gas, no trauam, no sob.  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 No evidence of gastric outlet obstruction.  Patinet has been managed with zofran pantop and 500 cc fluids. At this time, patient is completely asymptoamtic.  Medical eval is sought.   ROS - patinet recalls one episode of LOC while walking his dog 2 weeks ago. No recurence since then. Has chronic sob - was SOB at the time..  Review of Systems: As mentioned in the history of present illness. All other systems reviewed and are negative.  Past Medical History:  Diagnosis Date   Abnormal exercise myocardial perfusion study    03/2018- 38% ef, see report   Arthritis    Chronic kidney disease    Congestive heart failure (CHF) (HCC) 09/2015   Coronary artery disease    DOE (dyspnea on exertion)    ED (erectile dysfunction)    GERD (gastroesophageal reflux disease)    occ   Gout    H/O hiatal hernia    Hyperlipidemia    Hypertension    Myocardial infarction (HCC)    Neuropathy    toes   Obesity    RBBB (right bundle branch block)    Stenosis of right internal carotid artery    Type II  diabetes mellitus (HCC)    Umbilical hernia    a. s/p repair.   Vertigo    Past Surgical History:  Procedure Laterality Date   ANKLE ARTHROSCOPY WITH DRILLING/MICROFRACTURE Left 04/13/2013   Procedure: LEFT ANKLE ARTHROSCOPY WITH EXTENSIVE DEBRIEDMENT;  Surgeon: Toni Arthurs, MD;  Location:  [facility];  Service: Orthopedics;  Laterality: Left;   ANKLE SURGERY Left 08/18/2013   DR HEWITT   CARDIAC CATHETERIZATION  2000   stents x3   CARDIAC CATHETERIZATION  02/15/2014   Procedure: LEFT HEART CATH AND CORS/GRAFTS ANGIOGRAPHY;  Surgeon: Pamella Pert, MD;  Location: [facility] CATH LAB;  Service: Cardiovascular;;   COLONOSCOPY     CORONARY ARTERY BYPASS GRAFT  1995   LIMA to LAD, SVG to RCA, SVG to OM.    CORONARY STENT INTERVENTION N/A 08/12/2020   Procedure: CORONARY STENT INTERVENTION;  Surgeon: Elder Negus, MD;  Location: [facility] CV LAB;  Service: Cardiovascular;  Laterality: N/A;   CORONARY STENT INTERVENTION N/A 09/04/2021   Procedure: CORONARY STENT INTERVENTION;  Surgeon: Yates Decamp, MD;  Location: [facility];  Service: Cardiovascular;  Laterality: N/A;   LEFT HEART CATH AND CORS/GRAFTS ANGIOGRAPHY N/A 08/12/2020   Procedure: LEFT HEART CATH AND CORS/GRAFTS ANGIOGRAPHY;  Surgeon: Elder Negus, MD;  Location: [facility];  Service: Cardiovascular;  Laterality: N/A;   LEFT HEART CATH AND CORS/GRAFTS ANGIOGRAPHY  N/A 09/04/2021   Procedure: LEFT HEART CATH AND CORS/GRAFTS ANGIOGRAPHY;  Surgeon: Yates Decamp, MD;  Location: [facility];  Service: Cardiovascular;  Laterality: N/A;   RIGHT/LEFT HEART CATH AND CORONARY/GRAFT ANGIOGRAPHY N/A 07/05/2023   Procedure: RIGHT/LEFT HEART CATH AND CORONARY/GRAFT ANGIOGRAPHY;  Surgeon: Yates Decamp, MD;  Location: [facility];  Service: Cardiovascular;  Laterality: N/A;   TOTAL ANKLE ARTHROPLASTY Left 08/17/2013   Procedure: LEFT TOTAL ANKLE REPLACEMENT WITH POSSIBLE GASTROC RECESSION ;  Surgeon: Toni Arthurs, MD;  Location: [facility] OR;  Service: Orthopedics;  Laterality: Left;   UMBILICAL HERNIA REPAIR  12/2007   Social History:  reports that he quit smoking about 52 years ago. His smoking use included cigarettes. He started smoking about 61 years ago. He has a 9 pack-year smoking history. He has quit using smokeless tobacco.  His smokeless tobacco use included chew. He reports that he does not currently use alcohol. He reports that he does not use drugs.  Allergies  Allergen Reactions   Brilinta [Ticagrelor] Shortness Of Breath   Alpha-Gal Hives   Crestor [Rosuvastatin Calcium] Other (See Comments)    Muscle pain- tolerating this 2022, however   Lipitor [Atorvastatin] Other (See Comments)    Intolerable muscle pain all over     Family History  Problem Relation Age of Onset   Aneurysm Mother    Heart disease Father    Heart attack Father    Stroke Brother     Prior to Admission medications   Medication Sig Start Date End Date Taking? Authorizing Provider  acetaminophen (TYLENOL) 500 MG tablet Take 500 mg by mouth every 6 (six) hours as needed for mild pain.    [provider]  bisacodyl (DULCOLAX) 5 MG EC tablet Take 1 tablet (5 mg total) by mouth at bedtime as needed for moderate constipation. 06/25/23   Gillis Santa, MD  Blood Glucose Monitoring Suppl DEVI 1 each by Does not apply route in the morning, at noon, and at bedtime. May substitute to any manufacturer covered by patient's insurance. 11/16/22   Donita Brooks, MD  cetirizine (ZYRTEC ALLERGY) 10 MG tablet Take 1 tablet (10 mg total) by mouth daily. 07/19/23   Charlsie Quest, NP  clopidogrel (PLAVIX) 75 MG tablet TAKE 1 TABLET EVERY DAY 11/23/22   Donita Brooks, MD  cyanocobalamin (VITAMIN B12) 1000 MCG tablet Take 1 tablet (1,000 mcg total) by mouth daily. 07/20/23 07/14/24  Donita Brooks, MD  empagliflozin (JARDIANCE) 10 MG TABS tablet Take 1 tablet (10 mg total) by mouth daily before breakfast. Patient not  taking: Reported on 07/28/2023 07/22/23   Donita Brooks, MD  furosemide (LASIX) 40 MG tablet Take 1 tablet (40 mg total) by mouth daily. 07/28/23 10/26/23  Delma Freeze, FNP  iron polysaccharides (NIFEREX) 150 MG capsule Take 1 capsule (150 mg total) by mouth daily. Patient not taking: Reported on 06/29/2023 06/25/23 09/23/23  Gillis Santa, MD  nitroGLYCERIN (NITROSTAT) 0.4 MG SL tablet Place 1 tablet (0.4 mg total) under the tongue every 5 (five) minutes as needed for chest pain. 04/06/22   Donita Brooks, MD  pantoprazole (PROTONIX) 40 MG tablet Take 1 tablet (40 mg total) by mouth daily. 05/31/23   Donita Brooks, MD  rosuvastatin (CRESTOR) 40 MG tablet TAKE 1 TABLET EVERY DAY 06/10/23   Yates Decamp, MD  spironolactone (ALDACTONE) 25 MG tablet Take 0.5 tablets (12.5 mg total) by mouth daily. 07/28/23 10/26/23  Delma Freeze, FNP  sucralfate (  CARAFATE) 1 g tablet TAKE 1 TABLET BY MOUTH FOUR TIMES DAILY WITH MEALS AND AT BEDTIME Patient not taking: No sig reported 05/12/23   Donita Brooks, MD  traZODone (DESYREL) 50 MG tablet Take 0.5 tablets (25 mg total) by mouth at bedtime as needed for sleep. 06/22/23   Park Meo, FNP    Physical Exam: Vitals:   08/27/23 1230 08/27/23 1245 08/27/23 1300 08/27/23 1520  BP: 94/72 101/77 114/66 121/73  Pulse: (!) 54 (!) 52 (!) 53 67  Resp: (!) 21 15 15  (!) 23  Temp:    97.8 F (36.6 C)  TempSrc:    Oral  SpO2: 100% 99% 100% 95%  Weight:      Height:       General: Patient is alert and awake, appears to be in no distress.  Gives a fully coherent account of symptoms Respiratory exam: Bilateral basilar fine crackles otherwise good excursion patient on room air Cardiovascular exam S1-S2 normal Abdomen all quadrants soft nontender Extremities warm without edema. Data Reviewed:  Labs on Admission:  Results for orders placed or performed during the hospital encounter of 08/27/23 (from the past 24 hours)  Comprehensive metabolic panel     Status:  Abnormal   Collection Time: 08/27/23 12:53 PM  Result Value Ref Range   Sodium 139 135 - 145 mmol/L   Potassium 3.9 3.5 - 5.1 mmol/L   Chloride 107 98 - 111 mmol/L   CO2 22 22 - 32 mmol/L   Glucose, Bld 106 (H) 70 - 99 mg/dL   BUN 15 8 - 23 mg/dL   Creatinine, Ser 4.09 (H) 0.61 - 1.24 mg/dL   Calcium 9.2 8.9 - 81.1 mg/dL   Total Protein 8.1 6.5 - 8.1 g/dL   Albumin 3.6 3.5 - 5.0 g/dL   AST 21 15 - 41 U/L   ALT 11 0 - 44 U/L   Alkaline Phosphatase 47 38 - 126 U/L   Total Bilirubin 0.7 0.0 - 1.2 mg/dL   GFR, Estimated 57 (L) >60 mL/min   Anion gap 10 5 - 15  CBC with Differential     Status: Abnormal   Collection Time: 08/27/23 12:53 PM  Result Value Ref Range   WBC 6.7 4.0 - 10.5 K/uL   RBC 4.99 4.22 - 5.81 MIL/uL   Hemoglobin 10.2 (L) 13.0 - 17.0 g/dL   HCT 91.4 (L) 78.2 - 95.6 %   MCV 72.7 (L) 80.0 - 100.0 fL   MCH 20.4 (L) 26.0 - 34.0 pg   MCHC 28.1 (L) 30.0 - 36.0 g/dL   RDW 21.3 (H) 08.6 - 57.8 %   Platelets 352 150 - 400 K/uL   nRBC 0.0 0.0 - 0.2 %   Neutrophils Relative % 49 %   Neutro Abs 3.3 1.7 - 7.7 K/uL   Lymphocytes Relative 31 %   Lymphs Abs 2.1 0.7 - 4.0 K/uL   Monocytes Relative 11 %   Monocytes Absolute 0.7 0.1 - 1.0 K/uL   Eosinophils Relative 8 %   Eosinophils Absolute 0.5 0.0 - 0.5 K/uL   Basophils Relative 1 %   Basophils Absolute 0.1 0.0 - 0.1 K/uL   Immature Granulocytes 0 %   Abs Immature Granulocytes 0.02 0.00 - 0.07 K/uL  Lipase, blood     Status: None   Collection Time: 08/27/23 12:53 PM  Result Value Ref Range   Lipase 44 11 - 51 U/L  Magnesium     Status: Abnormal   Collection Time:  08/27/23 12:53 PM  Result Value Ref Range   Magnesium 2.5 (H) 1.7 - 2.4 mg/dL  Troponin I (High Sensitivity)     Status: None   Collection Time: 08/27/23 12:53 PM  Result Value Ref Range   Troponin I (High Sensitivity) 9 <18 ng/L   Basic Metabolic Panel: Recent Labs  Lab 08/27/23 1253  NA 139  K 3.9  CL 107  CO2 22  GLUCOSE 106*  BUN 15   CREATININE 1.27*  CALCIUM 9.2  MG 2.5*   Liver Function Tests: Recent Labs  Lab 08/27/23 1253  AST 21  ALT 11  ALKPHOS 47  BILITOT 0.7  PROT 8.1  ALBUMIN 3.6   Recent Labs  Lab 08/27/23 1253  LIPASE 44   No results for input(s): "AMMONIA" in the last 168 hours. CBC: Recent Labs  Lab 08/27/23 1253  WBC 6.7  NEUTROABS 3.3  HGB 10.2*  HCT 36.3*  MCV 72.7*  PLT 352   Cardiac Enzymes: Recent Labs  Lab 08/27/23 1253  TROPONINIHS 9    BNP (last 3 results) No results for input(s): "PROBNP" in the last 8760 hours. CBG: No results for input(s): "GLUCAP" in the last 168 hours.  Radiological Exams on Admission:  CT ABDOMEN PELVIS W CONTRAST Result Date: 08/27/2023 CLINICAL DATA:  Epigastric pain Large hiatal hernia, prior gastric volvulus. Rule out obstruction. EXAM: CT ABDOMEN AND PELVIS WITH CONTRAST TECHNIQUE: Multidetector CT imaging of the abdomen and pelvis was performed using the standard protocol following bolus administration of intravenous contrast. RADIATION DOSE REDUCTION: This exam was performed according to the departmental dose-optimization program which includes automated exposure control, adjustment of the mA and/or kV according to patient size and/or use of iterative reconstruction technique. CONTRAST:  OMNIPAQUE IOHEXOL 300 MG/ML  SOLN COMPARISON:  CT scan abdomen and pelvis from 08/11/2023.  The FINDINGS: Lower chest: There are reticulations in the visualized bilateral lungs, favoring pulmonary fibrosis. Bronchiectatic changes noted in the right lung base. However, no honeycombing. No pleural effusion. The heart is normal in size. No pericardial effusion. Hepatobiliary: The liver is normal in size. Non-cirrhotic configuration. No suspicious mass. These is mild diffuse hepatic steatosis. No intrahepatic or extrahepatic bile duct dilation. No calcified gallstones. Normal gallbladder wall thickness. No pericholecystic inflammatory changes. Pancreas:  Unremarkable. No pancreatic ductal dilatation or surrounding inflammatory changes. Spleen: Within normal limits. No focal lesion. Adrenals/Urinary Tract: Adrenal glands are unremarkable. No suspicious renal mass. There is a partially exophytic 4.1 x 4.1 cm simple cyst arising from the right kidney interpolar region, anteriorly. There is a 4 mm nonobstructing calculus in the right kidney lower pole calyx. No other nephroureterolithiasis on either side. Urinary bladder is under distended, precluding optimal assessment. However, no large mass or stones identified. No perivesical fat stranding. 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 However, no abnormal wall thickening, perigastric fat stranding or abnormal distension to suggest gastric outlet obstruction. The configuration of hiatal hernia is essentially unchanged since the prior study. No disproportionate dilation of the small or large bowel loops. No evidence of abnormal bowel wall thickening or inflammatory changes. The appendix is unremarkable. There are multiple diverticula mainly in the sigmoid colon, without imaging signs of diverticulitis. Vascular/Lymphatic: No ascites or pneumoperitoneum. No abdominal or pelvic lymphadenopathy, by size criteria. No aneurysmal dilation of the major abdominal arteries. There are moderate peripheral atherosclerotic vascular calcifications of the aorta and its major branches. Reproductive: Normal size prostate. Symmetric seminal vesicles. Other: The visualized soft  tissues and abdominal wall are unremarkable. Musculoskeletal: No suspicious osseous lesions. There are mild - moderate multilevel degenerative changes in the visualized spine. IMPRESSION: 1. No acute inflammatory process identified within the abdomen or pelvis. 2. 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 No evidence of gastric outlet obstruction. 3.  Multiple other nonacute observations, as described above. Aortic Atherosclerosis (ICD10-I70.0). Electronically Signed   By: Jules Schick M.D.   On: 08/27/2023 16:57   DG Abd Portable 1 View Result Date: 08/27/2023 CLINICAL DATA:  Abdominal pain. EXAM: PORTABLE ABDOMEN - 1 VIEW COMPARISON:  10/01/2020. FINDINGS: The bowel gas pattern is non-obstructive. No evidence of pneumoperitoneum, within the limitations of a supine film. No acute osseous abnormalities. There is a 3 mm calcification overlying the right renal shadow region, which can be seen with a renal calculus. The soft tissues are otherwise within normal limits. Surgical changes, devices, tubes and lines: None. IMPRESSION: Nonobstructive bowel gas pattern. Electronically Signed   By: Jules Schick M.D.   On: 08/27/2023 16:38   DG Chest 2 View Result Date: 08/27/2023 CLINICAL DATA:  Chest pain.  Difficulty swallowing. EXAM: CHEST - 2 VIEW COMPARISON:  Chest radiograph dated 06/24/2023. FINDINGS: The heart size and mediastinal contours are unchanged. Prior median sternotomy and CABG. Large hiatal hernia again noted. Bilateral basilar predominant chronic interstitial changes are again noted. No appreciable new focal consolidation, pleural effusion, or pneumothorax. No acute osseous abnormality. IMPRESSION: 1. 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 2. Similar large hiatal hernia. Electronically Signed   By: Hart Robinsons M.D.   On: 08/27/2023 13:35    No intake/output data recorded. No intake/output data recorded.      Assessment and Plan: * Hiatal hernia Previously known, this is felt to be symptomatic and principal reason for admission today.  Patient is fortunately having good control of symptoms after ER Rx as above.  However patient has had multiple hospitalization with symptoms of nausea vomiting chest and abdominal area pain.  Troponin is negative, no hypoxemia.  No lipase elevation.  WBC not actionable.  And  electrolytes largely within normal limits.  No acidosis.  See GI evaluation in chart.  Will keep n.p.o. with IV fluids.  I have engaged Dr. Magnus Ivan of surgery.  He advised that ideally patient should stay off Plavix and get procedure done as an inpatient by Dr. Vickii Chafe after the weekend.  They will however follow along the patient and, if their hand is forced, operate as needed sooner. C/w pantop and zofran.  Renal mass See CT above. Will need deferred triple phase CT due to contrast admin today already.  Syncope See hpi , 2 weeks ago. Echo on file from December - known CHF reduced EF 35-40%, RWM abnormalities. 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 Was exerting at the time. Keep on telemetry.     DVT ppx with SCD. Allergy to lovenox. Arixtra  Patient home medication reconciliation is pending pharmacy input   Advance Care Planning:   Code Status: Prior patient wishes to be full code.  Consults: GI/ surgery.  Family Communication: per patient.  Severity of Illness: The appropriate patient status for this patient is INPATIENT. Inpatient status is judged to be reasonable and necessary in order to provide the required intensity of service to ensure the patient's safety. The patient's presenting symptoms, physical exam findings, and initial radiographic and laboratory data in the context of their chronic comorbidities is felt to place them at high risk for  further clinical deterioration. Furthermore, it is not anticipated that the patient will be medically stable for discharge from the hospital within 2 midnights of admission.   * I certify that at the point of admission it is my clinical judgment that the patient will require inpatient hospital care spanning beyond 2 midnights from the point of admission due to high intensity of service, high risk for further deterioration and high frequency of surveillance required.*  Author: Nolberto Hanlon, MD 08/27/2023 5:25 PM  For on call review  [email or web address].

## 2023-08-28 DIAGNOSIS — Z01818 Encounter for other preprocedural examination: Secondary | ICD-10-CM

## 2023-08-28 DIAGNOSIS — R112 Nausea with vomiting, unspecified: Secondary | ICD-10-CM | POA: Diagnosis not present

## 2023-08-28 DIAGNOSIS — R1013 Epigastric pain: Secondary | ICD-10-CM | POA: Diagnosis not present

## 2023-08-28 DIAGNOSIS — Z7901 Long term (current) use of anticoagulants: Secondary | ICD-10-CM

## 2023-08-28 DIAGNOSIS — K449 Diaphragmatic hernia without obstruction or gangrene: Secondary | ICD-10-CM | POA: Diagnosis not present

## 2023-08-28 LAB — BASIC METABOLIC PANEL WITH GFR
Anion gap: 9 (ref 5–15)
BUN: 12 mg/dL (ref 8–23)
CO2: 23 mmol/L (ref 22–32)
Calcium: 8.8 mg/dL — ABNORMAL LOW (ref 8.9–10.3)
Chloride: 107 mmol/L (ref 98–111)
Creatinine, Ser: 1.21 mg/dL (ref 0.61–1.24)
GFR, Estimated: >60 mL/min (ref >60)
Glucose, Bld: 120 mg/dL — ABNORMAL HIGH (ref 70–99)
Potassium: 3.8 mmol/L (ref 3.5–5.1)
Sodium: 139 mmol/L (ref 135–145)

## 2023-08-28 LAB — APTT: aPTT: 28 s (ref 24–36)

## 2023-08-28 LAB — PROTIME-INR
INR: 1.1 (ref 0.8–1.2)
Prothrombin Time: 14.5 s (ref 11.4–15.2)

## 2023-08-28 LAB — CBC
HCT: 33.7 % — ABNORMAL LOW (ref 39.0–52.0)
Hemoglobin: 9.2 g/dL — ABNORMAL LOW (ref 13.0–17.0)
MCH: 20.4 pg — ABNORMAL LOW (ref 26.0–34.0)
MCHC: 27.3 g/dL — ABNORMAL LOW (ref 30.0–36.0)
MCV: 74.6 fL — ABNORMAL LOW (ref 80.0–100.0)
Platelets: 291 10*3/uL (ref 150–400)
RBC: 4.52 MIL/uL (ref 4.22–5.81)
RDW: 19.3 % — ABNORMAL HIGH (ref 11.5–15.5)
WBC: 6.1 10*3/uL (ref 4.0–10.5)
nRBC: 0 % (ref 0.0–0.2)

## 2023-08-28 MED ORDER — DEXTROSE IN LACTATED RINGERS 5 % IV SOLN: INTRAVENOUS | Status: AC

## 2023-08-28 NOTE — Progress Notes (Signed)
 Inpatient Progress Note     Patient Profile/Chief Complaint   83 y.o. 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   Chris [name] is admitted to the hospital with worsening chest/epigastric pain, vomiting precluding nutrition and fluid intake.  CT imaging consistent with stable chronic large hiatal hernia and chronic volvulus.   Interval History   -- No acute events overnight -- Endorses some discomfort in the left chest but states it is less than before since he is not eating -- Trying some ice chips this morning -- Has not had significant nutrition for several days; endorses significant weight loss > 10 lbs    Objective   Vital signs in last 24 hours: Temp:  [97.3 F (36.3 C)-98 F (36.7 C)] 98 F (36.7 C) (04/05 0631) Pulse Rate:  [52-77] 60 (04/05 0631) Resp:  [15-23] 16 (04/05 0631) BP: (91-131)/(61-96) 113/61 (04/05 0631) SpO2:  [93 %-100 %] 93 % (04/05 0631) Weight:  [91.2 kg] 91.2 kg (04/04 1205) Last BM Date : 08/24/23 General:    Elderly male sitting on the edge of the bed no distress Chest: Well-healed scar across chest Heart:  Regular rate and rhythm; no murmurs Lungs: Respirations even and unlabored, lungs CTA bilaterally Abdomen:  Soft, minimal tenderness in the epigastrium and nondistended. Normal bowel sounds. Extremities:  Without edema. Neurologic:  Alert and oriented,  grossly normal neurologically. Psych:  Cooperative. Normal mood and affect.  Intake/Output from previous day: 04/04 0701 - 04/05 0700 In: 979.3 [I.V.:979.3] Out: -  Intake/Output this shift: No intake/output data recorded.  Lab Results: Recent Labs    08/27/23 1253  08/28/23 0548  WBC 6.7 6.1  HGB 10.2* 9.2*  HCT 36.3* 33.7*  PLT 352 291   BMET Recent Labs    08/27/23 1253 08/28/23 0548  NA 139 139  K 3.9 3.8  CL 107 107  CO2 22 23  GLUCOSE 106* 120*  BUN 15 12  CREATININE 1.27* 1.21  CALCIUM 9.2 8.8*   LFT Recent Labs    08/27/23 1253  PROT 8.1  ALBUMIN 3.6  AST 21  ALT 11  ALKPHOS 47  BILITOT 0.7   PT/INR Recent Labs    08/28/23 0548  LABPROT 14.5  INR 1.1    Studies/Results: CT ABDOMEN PELVIS W CONTRAST Result Date: 08/27/2023 CLINICAL DATA:  Epigastric pain Large hiatal hernia, prior gastric volvulus. Rule out obstruction. EXAM: CT ABDOMEN AND PELVIS WITH CONTRAST TECHNIQUE: Multidetector CT imaging of the abdomen and pelvis was performed using the standard protocol following bolus administration of intravenous contrast. RADIATION DOSE REDUCTION: This exam was performed according to the departmental dose-optimization program which includes automated exposure control, adjustment of the mA and/or kV according to patient size and/or use of iterative reconstruction technique. CONTRAST:  OMNIPAQUE IOHEXOL 300 MG/ML  SOLN COMPARISON:  CT scan abdomen and pelvis from 08/11/2023.  The FINDINGS: Lower chest: There are reticulations in the visualized bilateral lungs, favoring pulmonary fibrosis. Bronchiectatic changes noted in the right lung base. However, no honeycombing. No pleural effusion. The heart is normal in size. No pericardial effusion. Hepatobiliary: The liver is normal in size. Non-cirrhotic configuration. No suspicious mass. These is mild diffuse hepatic steatosis.  No intrahepatic or extrahepatic bile duct dilation. No calcified gallstones. Normal gallbladder wall thickness. No pericholecystic inflammatory changes. Pancreas: Unremarkable. No pancreatic ductal dilatation or surrounding inflammatory changes. Spleen: Within normal limits. No focal lesion. Adrenals/Urinary Tract: Adrenal glands are unremarkable. No suspicious  renal mass. There is a partially exophytic 4.1 x 4.1 cm simple cyst arising from the right kidney interpolar region, anteriorly. There is a 4 mm nonobstructing calculus in the right kidney lower pole calyx. No other nephroureterolithiasis on either side. Urinary bladder is under distended, precluding optimal assessment. However, no large mass or stones identified. No perivesical fat stranding. 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 However, no abnormal wall thickening, perigastric fat stranding or abnormal distension to suggest gastric outlet obstruction. The configuration of hiatal hernia is essentially unchanged since the prior study. No disproportionate dilation of the small or large bowel loops. No evidence of abnormal bowel wall thickening or inflammatory changes. The appendix is unremarkable. There are multiple diverticula mainly in the sigmoid colon, without imaging signs of diverticulitis. Vascular/Lymphatic: No ascites or pneumoperitoneum. No abdominal or pelvic lymphadenopathy, by size criteria. No aneurysmal dilation of the major abdominal arteries. There are moderate peripheral atherosclerotic vascular calcifications of the aorta and its major branches. Reproductive: Normal size prostate. Symmetric seminal vesicles. Other: The visualized soft tissues and abdominal wall are unremarkable. Musculoskeletal: No suspicious osseous lesions. There are mild - moderate multilevel degenerative changes in the visualized spine. IMPRESSION: 1. No acute inflammatory process identified within the abdomen or pelvis. 2. 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 No evidence of gastric outlet obstruction. 3. Multiple other nonacute observations, as described above. Aortic Atherosclerosis (ICD10-I70.0). Electronically Signed   By: Jules Schick M.D.   On: 08/27/2023 16:57   DG Abd Portable 1 View Result Date:  08/27/2023 CLINICAL DATA:  Abdominal pain. EXAM: PORTABLE ABDOMEN - 1 VIEW COMPARISON:  10/01/2020. FINDINGS: The bowel gas pattern is non-obstructive. No evidence of pneumoperitoneum, within the limitations of a supine film. No acute osseous abnormalities. There is a 3 mm calcification overlying the right renal shadow region, which can be seen with a renal calculus. The soft tissues are otherwise within normal limits. Surgical changes, devices, tubes and lines: None. IMPRESSION: Nonobstructive bowel gas pattern. Electronically Signed   By: Jules Schick M.D.   On: 08/27/2023 16:38   DG Chest 2 View Result Date: 08/27/2023 CLINICAL DATA:  Chest pain.  Difficulty swallowing. EXAM: CHEST - 2 VIEW COMPARISON:  Chest radiograph dated 06/24/2023. FINDINGS: The heart size and mediastinal contours are unchanged. Prior median sternotomy and CABG. Large hiatal hernia again noted. Bilateral basilar predominant chronic interstitial changes are again noted. No appreciable new focal consolidation, pleural effusion, or pneumothorax. No acute osseous abnormality. IMPRESSION: 1. 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 2. Similar large hiatal hernia. Electronically Signed   By: Hart Robinsons M.D.   On: 08/27/2023 13:35    Endoscopic Studies: None   Clinical Impression   It is my clinical impression that Chris [name] is an  83 y.o. male with a large hiatal hernia and chronic volvulus admitted to the hospital with progressive esophageal and epigastric pain, nausea, vomiting and poor oral intake.  Repeat CT imaging this admission shows stable configuration of his large hiatal hernia and chronic volvulus -no acute changes, evidence of peritonitis or ischemia.  He was previously seen by Dr. Michaell Cowing as an outpatient with discussion regarding laparoscopic or robotic hiatal hernia  repair.  He is being followed by general surgery in the hospital and is now in agreement with future surgery once Dr. Michaell Cowing is  available.  Surgery notes indicate that a preoperative EGD is requested to rule out additional pathology which is certainly reasonable.  Records document that he is on Plavix for history of CABG with multiple PCI's.  Our team's initial consult note from 08/27/2023 states that his last dose of Plavix may have been 3 or 4 days prior to that date (3/31 or 4/1).  Chris [name] is vague during discussion today regarding confirmation of the last day he took his anticoagulation.  Discussed with Chris [name] that in light of his history of taking an anticoagulant and absence of a current emergency, his procedure would most likely be scheduled for Monday 08/30/23.   Plan  Plan for EGD 08/30/2023 Continue n.p.o. with ice chips and IV fluid hydration Continue pantoprazole 40 mg IV twice daily Continue ondansetron 4 mg IV every 6 hours as needed for nausea and vomiting Appreciate general surgery recommendations and will coordinate care with our surgical colleagues. Continue holding Plavix.   LOS: 1 day   Ottie Glazier  08/28/2023, 11:04 AM  Maren Beach, MD [facility]

## 2023-08-28 NOTE — Plan of Care (Signed)
  Problem: Education: Goal: Knowledge of General Education information will improve Description: Including pain rating scale, medication(s)/side effects and non-pharmacologic comfort measures Outcome: Progressing   Problem: Activity: Goal: Risk for activity intolerance will decrease Outcome: Progressing   Problem: Nutrition: Goal: Adequate nutrition will be maintained Outcome: Not Progressing   Problem: Safety: Goal: Ability to remain free from injury will improve Outcome: Progressing

## 2023-08-28 NOTE — Plan of Care (Signed)

## 2023-08-28 NOTE — Progress Notes (Signed)
 Mobility Specialist - Progress Note   08/28/23 0951  Mobility  Activity Ambulated with assistance in hallway  Level of Assistance Modified independent, requires aide device or extra time  Assistive Device Front wheel walker  Distance Ambulated (ft) 275 ft  Activity Response Tolerated well  Mobility Referral Yes  Mobility visit 1 Mobility  Mobility Specialist Stop Time (ACUTE ONLY) 0951   Pt received in bed and agreeable to mobility. No complaints during session. Pt to EOB after session with all needs met.   [facility]

## 2023-08-28 NOTE — Progress Notes (Signed)
 Subjective/Chief Complaint: Pt reports sleeping well last night Denies abdominal pain or chest pain    Objective: Vital signs in last 24 hours: Temp:  [97.3 F (36.3 C)-98 F (36.7 C)] 98 F (36.7 C) (04/05 0631) Pulse Rate:  [52-77] 60 (04/05 0631) Resp:  [15-23] 16 (04/05 0631) BP: (91-131)/(61-96) 113/61 (04/05 0631) SpO2:  [93 %-100 %] 93 % (04/05 0631) Weight:  [91.2 kg] 91.2 kg (04/04 1205) Last BM Date : 08/24/23  Intake/Output from previous day: 04/04 0701 - 04/05 0700 In: 979.3 [I.V.:979.3] Out: -  Intake/Output this shift: No intake/output data recorded.  Exam: Awake and alert Looks comfortable  Abdomen soft, non-tender  Lab Results:  Recent Labs    08/27/23 1253 08/28/23 0548  WBC 6.7 6.1  HGB 10.2* 9.2*  HCT 36.3* 33.7*  PLT 352 291   BMET Recent Labs    08/27/23 1253 08/28/23 0548  NA 139 139  K 3.9 3.8  CL 107 107  CO2 22 23  GLUCOSE 106* 120*  BUN 15 12  CREATININE 1.27* 1.21  CALCIUM 9.2 8.8*   PT/INR Recent Labs    08/28/23 0548  LABPROT 14.5  INR 1.1   ABG No results for input(s): "PHART", "HCO3" in the last 72 hours.  Invalid input(s): "PCO2", "PO2"  Studies/Results: CT ABDOMEN PELVIS W CONTRAST Result Date: 08/27/2023 CLINICAL DATA:  Epigastric pain Large hiatal hernia, prior gastric volvulus. Rule out obstruction. EXAM: CT ABDOMEN AND PELVIS WITH CONTRAST TECHNIQUE: Multidetector CT imaging of the abdomen and pelvis was performed using the standard protocol following bolus administration of intravenous contrast. RADIATION DOSE REDUCTION: This exam was performed according to the departmental dose-optimization program which includes automated exposure control, adjustment of the mA and/or kV according to patient size and/or use of iterative reconstruction technique. CONTRAST:  OMNIPAQUE IOHEXOL 300 MG/ML  SOLN COMPARISON:  CT scan abdomen and pelvis from 08/11/2023.  The FINDINGS: Lower chest: There are reticulations in  the visualized bilateral lungs, favoring pulmonary fibrosis. Bronchiectatic changes noted in the right lung base. However, no honeycombing. No pleural effusion. The heart is normal in size. No pericardial effusion. Hepatobiliary: The liver is normal in size. Non-cirrhotic configuration. No suspicious mass. These is mild diffuse hepatic steatosis. No intrahepatic or extrahepatic bile duct dilation. No calcified gallstones. Normal gallbladder wall thickness. No pericholecystic inflammatory changes. Pancreas: Unremarkable. No pancreatic ductal dilatation or surrounding inflammatory changes. Spleen: Within normal limits. No focal lesion. Adrenals/Urinary Tract: Adrenal glands are unremarkable. No suspicious renal mass. There is a partially exophytic 4.1 x 4.1 cm simple cyst arising from the right kidney interpolar region, anteriorly. There is a 4 mm nonobstructing calculus in the right kidney lower pole calyx. No other nephroureterolithiasis on either side. Urinary bladder is under distended, precluding optimal assessment. However, no large mass or stones identified. No perivesical fat stranding. 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 However, no abnormal wall thickening, perigastric fat stranding or abnormal distension to suggest gastric outlet obstruction. The configuration of hiatal hernia is essentially unchanged since the prior study. No disproportionate dilation of the small or large bowel loops. No evidence of abnormal bowel wall thickening or inflammatory changes. The appendix is unremarkable. There are multiple diverticula mainly in the sigmoid colon, without imaging signs of diverticulitis. Vascular/Lymphatic: No ascites or pneumoperitoneum. No abdominal or pelvic lymphadenopathy, by size criteria. No aneurysmal dilation of the major abdominal arteries. There are moderate peripheral atherosclerotic vascular calcifications of the aorta and its major  branches. Reproductive: Normal size prostate. Symmetric seminal vesicles. Other: The visualized soft tissues and abdominal wall are unremarkable. Musculoskeletal: No suspicious osseous lesions. There are mild - moderate multilevel degenerative changes in the visualized spine. IMPRESSION: 1. No acute inflammatory process identified within the abdomen or pelvis. 2. 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 No evidence of gastric outlet obstruction. 3. Multiple other nonacute observations, as described above. Aortic Atherosclerosis (ICD10-I70.0). Electronically Signed   By: Jules Schick M.D.   On: 08/27/2023 16:57   DG Abd Portable 1 View Result Date: 08/27/2023 CLINICAL DATA:  Abdominal pain. EXAM: PORTABLE ABDOMEN - 1 VIEW COMPARISON:  10/01/2020. FINDINGS: The bowel gas pattern is non-obstructive. No evidence of pneumoperitoneum, within the limitations of a supine film. No acute osseous abnormalities. There is a 3 mm calcification overlying the right renal shadow region, which can be seen with a renal calculus. The soft tissues are otherwise within normal limits. Surgical changes, devices, tubes and lines: None. IMPRESSION: Nonobstructive bowel gas pattern. Electronically Signed   By: Jules Schick M.D.   On: 08/27/2023 16:38   DG Chest 2 View Result Date: 08/27/2023 CLINICAL DATA:  Chest pain.  Difficulty swallowing. EXAM: CHEST - 2 VIEW COMPARISON:  Chest radiograph dated 06/24/2023. FINDINGS: The heart size and mediastinal contours are unchanged. Prior median sternotomy and CABG. Large hiatal hernia again noted. Bilateral basilar predominant chronic interstitial changes are again noted. No appreciable new focal consolidation, pleural effusion, or pneumothorax. No acute osseous abnormality. IMPRESSION: 1. 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 2. Similar large hiatal hernia. Electronically Signed   By: Hart Robinsons M.D.   On:  08/27/2023 13:35    Anti-infectives: Anti-infectives (From admission, onward)    None       Assessment/Plan: Large hiatal hernia  I copied Dr. Gordy Savers impression and recommendations into me note from late yesterday when he was seen in the office on 4/1  -He recommended a preop EGD as well as cardiac clearance. -Dr.  Derrell Lolling is our acute care surgeon for the week starting Monday and he does perform lap/robotic hiatal hernia surgery.  We will also notify Dr. Michaell Cowing on Monday and they can decided of timing of surgery. -no plans for surgery on the week unless emergent -we will see PRN tomorrow  LOS: 1 day    Chris [name] 08/28/2023

## 2023-08-28 NOTE — Progress Notes (Signed)
 PROGRESS NOTE  Chris [name].  UYQ:[id number] DOB: 1940-06-10 DOA: 08/27/2023 PCP: Donita Brooks, MD   Brief Narrative: Patient is a 83 year old male with history of hiatal hernia who presented with complaint of difficulty swallowing, epigastric discomfort, nausea, vomiting for 2 weeks.  On presentation, he was hemodynamically stable.  CT abdomen/pelvis showed moderate to large paraesophageal hernia containing majority of his stomach, no evidence of previous liver function.  General surgery, GI consulted.Plan for EGD  Assessment & Plan:  Principal Problem:   Hiatal hernia Active Problems:   Syncope   Renal mass  Hiatal hernia: Previously known.  Presented with difficulty swallowing, nausea, vomiting, epigastric discomfort.  Multiple hospitalization in the past for the same.  General surgery, GI consulted.  General surgery planning for robotic/laparoscopic hiatal hernia repair, early next week. CT abdomen/pelvis did not show obstruction from his hernia. GI also following.  Recommended to continue Protonix, n.p.o., IV fluid.  Plan for EGD in 08/30/2023  Renal cyst: No suspicious renal mass. There is a partially exophytic 4.1 x 4.1 cm simple cyst arising from the right kidney interpolar region, anteriorly. There is a 4 mm nonobstructing calculus in the right kidney lower pole calyx.Monitor as outpatient  History of syncope: Happened about 2 weeks ago.  Continue telemetry.  PT/OT will be consulted after surgical intervention.  History of HFrEF: Currently euvolemic.  Last echo showed EF of 35 to 40%, regional wall motion abnormality.  Follows with Dr. Jacinto Halim  History of pulmonary fibrosis:Follows with pulmonology        DVT prophylaxis:fondaparinux (ARIXTRA) injection 2.5 mg Start: 08/28/23 0800 Place and maintain sequential compression device Start: 08/27/23 1756 SCDs Start: 08/27/23 1741     Code Status: Full Code  Family Communication: None at bedside  Patient  status:Inpatient  Patient is from :home  Anticipated discharge GL:OVFI  Estimated DC date:1-2 weeks   Consultants: None  Procedures:None  Antimicrobials:  Anti-infectives (From admission, onward)    None       Subjective: Patient seen and examined the bedside today.  Hemodynamically stable.  Comfortable.  Denies any abdominal pain, nausea or vomiting this morning.  Abdomen is benign on examination.  Currently n.p.o.  Objective: Vitals:   08/27/23 1845 08/27/23 2307 08/28/23 0235 08/28/23 0631  BP: (!) 131/96 108/65 113/84 113/61  Pulse: (!) 59 (!) 53 (!) 55 60  Resp: 18 18 16 16   Temp: 97.8 F (36.6 C) 97.9 F (36.6 C) (!) 97.3 F (36.3 C) 98 F (36.7 C)  TempSrc: Oral Oral Oral Oral  SpO2: 96% 97% 94% 93%  Weight:      Height:        Intake/Output Summary (Last 24 hours) at 08/28/2023 0814 Last data filed at 08/28/2023 4332 Gross per 24 hour  Intake 979.34 ml  Output --  Net 979.34 ml   Filed Weights   08/27/23 1205  Weight: 91.2 kg    Examination:  General exam: Overall comfortable, not in distress HEENT: PERRL Respiratory system:  crackles on bilateral bases  Cardiovascular system: S1 & S2 heard, RRR.  Gastrointestinal system: Abdomen is nondistended, soft and nontender. Central nervous system: Alert and oriented Extremities: No edema, no clubbing ,no cyanosis Skin: No rashes, no ulcers,no icterus     Data Reviewed: I have personally reviewed following labs and imaging studies  CBC: Recent Labs  Lab 08/27/23 1253 08/28/23 0548  WBC 6.7 6.1  NEUTROABS 3.3  --   HGB 10.2* 9.2*  HCT 36.3* 33.7*  MCV 72.7*  74.6*  PLT 352 291   Basic Metabolic Panel: Recent Labs  Lab 08/27/23 1253 08/28/23 0548  NA 139 139  K 3.9 3.8  CL 107 107  CO2 22 23  GLUCOSE 106* 120*  BUN 15 12  CREATININE 1.27* 1.21  CALCIUM 9.2 8.8*  MG 2.5*  --      No results found for this or any previous visit (from the past 240 hours).   Radiology Studies: CT  ABDOMEN PELVIS W CONTRAST Result Date: 08/27/2023 CLINICAL DATA:  Epigastric pain Large hiatal hernia, prior gastric volvulus. Rule out obstruction. EXAM: CT ABDOMEN AND PELVIS WITH CONTRAST TECHNIQUE: Multidetector CT imaging of the abdomen and pelvis was performed using the standard protocol following bolus administration of intravenous contrast. RADIATION DOSE REDUCTION: This exam was performed according to the departmental dose-optimization program which includes automated exposure control, adjustment of the mA and/or kV according to patient size and/or use of iterative reconstruction technique. CONTRAST:  OMNIPAQUE IOHEXOL 300 MG/ML  SOLN COMPARISON:  CT scan abdomen and pelvis from 08/11/2023.  The FINDINGS: Lower chest: There are reticulations in the visualized bilateral lungs, favoring pulmonary fibrosis. Bronchiectatic changes noted in the right lung base. However, no honeycombing. No pleural effusion. The heart is normal in size. No pericardial effusion. Hepatobiliary: The liver is normal in size. Non-cirrhotic configuration. No suspicious mass. These is mild diffuse hepatic steatosis. No intrahepatic or extrahepatic bile duct dilation. No calcified gallstones. Normal gallbladder wall thickness. No pericholecystic inflammatory changes. Pancreas: Unremarkable. No pancreatic ductal dilatation or surrounding inflammatory changes. Spleen: Within normal limits. No focal lesion. Adrenals/Urinary Tract: Adrenal glands are unremarkable. No suspicious renal mass. There is a partially exophytic 4.1 x 4.1 cm simple cyst arising from the right kidney interpolar region, anteriorly. There is a 4 mm nonobstructing calculus in the right kidney lower pole calyx. No other nephroureterolithiasis on either side. Urinary bladder is under distended, precluding optimal assessment. However, no large mass or stones identified. No perivesical fat stranding. Stomach/Bowel: There is moderate to large paraesophageal hernia  containing majority of the stomach. However, no abnormal wall thickening, perigastric fat stranding or abnormal distension to suggest gastric outlet obstruction. The configuration of hiatal hernia is essentially unchanged since the prior study. No disproportionate dilation of the small or large bowel loops. No evidence of abnormal bowel wall thickening or inflammatory changes. The appendix is unremarkable. There are multiple diverticula mainly in the sigmoid colon, without imaging signs of diverticulitis. Vascular/Lymphatic: No ascites or pneumoperitoneum. No abdominal or pelvic lymphadenopathy, by size criteria. No aneurysmal dilation of the major abdominal arteries. There are moderate peripheral atherosclerotic vascular calcifications of the aorta and its major branches. Reproductive: Normal size prostate. Symmetric seminal vesicles. Other: The visualized soft tissues and abdominal wall are unremarkable. Musculoskeletal: No suspicious osseous lesions. There are mild - moderate multilevel degenerative changes in the visualized spine. IMPRESSION: 1. No acute inflammatory process identified within the abdomen or pelvis. 2. 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 No evidence of gastric outlet obstruction. 3. Multiple other nonacute observations, as described above. Aortic Atherosclerosis (ICD10-I70.0). Electronically Signed   By: Jules Schick M.D.   On: 08/27/2023 16:57   DG Abd Portable 1 View Result Date: 08/27/2023 CLINICAL DATA:  Abdominal pain. EXAM: PORTABLE ABDOMEN - 1 VIEW COMPARISON:  10/01/2020. FINDINGS: The bowel gas pattern is non-obstructive. No evidence of pneumoperitoneum, within the limitations of a supine film. No acute osseous abnormalities. There is a 3 mm calcification overlying the  right renal shadow region, which can be seen with a renal calculus. The soft tissues are otherwise within normal limits. Surgical changes, devices, tubes and  lines: None. IMPRESSION: Nonobstructive bowel gas pattern. Electronically Signed   By: Jules Schick M.D.   On: 08/27/2023 16:38   DG Chest 2 View Result Date: 08/27/2023 CLINICAL DATA:  Chest pain.  Difficulty swallowing. EXAM: CHEST - 2 VIEW COMPARISON:  Chest radiograph dated 06/24/2023. FINDINGS: The heart size and mediastinal contours are unchanged. Prior median sternotomy and CABG. Large hiatal hernia again noted. Bilateral basilar predominant chronic interstitial changes are again noted. No appreciable new focal consolidation, pleural effusion, or pneumothorax. No acute osseous abnormality. IMPRESSION: 1. 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 2. Similar large hiatal hernia. Electronically Signed   By: Hart Robinsons M.D.   On: 08/27/2023 13:35    Scheduled Meds:  fondaparinux (ARIXTRA) injection  2.5 mg Subcutaneous Q24H   pantoprazole (PROTONIX) IV  40 mg Intravenous Q12H   sodium chloride flush  3 mL Intravenous Q12H   Continuous Infusions:  dextrose 5% lactated ringers 100 mL/hr at 08/28/23 0643     LOS: 1 day   Burnadette Pop, MD [facility] P4/09/2023, 8:14 AM

## 2023-08-29 DIAGNOSIS — R112 Nausea with vomiting, unspecified: Secondary | ICD-10-CM | POA: Diagnosis not present

## 2023-08-29 DIAGNOSIS — R1013 Epigastric pain: Secondary | ICD-10-CM | POA: Diagnosis not present

## 2023-08-29 DIAGNOSIS — K449 Diaphragmatic hernia without obstruction or gangrene: Secondary | ICD-10-CM | POA: Diagnosis not present

## 2023-08-29 DIAGNOSIS — Z01818 Encounter for other preprocedural examination: Secondary | ICD-10-CM | POA: Diagnosis not present

## 2023-08-29 NOTE — Plan of Care (Signed)

## 2023-08-29 NOTE — H&P (View-Only) (Signed)
 Inpatient Progress Note     Patient Profile/Chief Complaint   83 y.o. 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   Chris [name] is admitted to the hospital with worsening chest/epigastric pain, vomiting precluding nutrition and fluid intake.  CT imaging consistent with stable chronic large hiatal hernia and chronic volvulus.   Interval History   -- No acute events overnight -- Continues to have some left chest discomfort but less than previous -- Requests to to try to eat or drink today -- Has not had significant nutrition for several days; endorses significant weight loss > 10 lbs    Objective   Vital signs in last 24 hours: Temp:  [97.5 F (36.4 C)-98.7 F (37.1 C)] 98 F (36.7 C) (04/06 1205) Pulse Rate:  [46-68] 60 (04/06 1205) Resp:  [15-16] 16 (04/06 0557) BP: (103-128)/(52-78) 103/70 (04/06 1205) SpO2:  [92 %-97 %] 97 % (04/06 1205) Last BM Date : 08/24/23 General:    Elderly male resting in bed in no distress Chest: Well-healed scar across chest Heart:  Regular rate and rhythm; no murmurs Lungs: Respirations even and unlabored, lungs CTA bilaterally Abdomen:  Soft, minimal tenderness in the epigastrium and nondistended. Normal bowel sounds. Extremities:  Without edema. Neurologic:  Alert and oriented,  grossly normal neurologically. Psych:  Cooperative. Normal mood and affect.  Intake/Output from previous day: 04/05 0701 - 04/06 0700 In: 837.7 [I.V.:837.7] Out: -  Intake/Output this shift: Total I/O In: 240 [P.O.:240] Out: -   Lab Results: Recent Labs    08/27/23 1253 08/28/23 0548  WBC 6.7 6.1  HGB 10.2* 9.2*  HCT 36.3* 33.7*  PLT 352  291   BMET Recent Labs    08/27/23 1253 08/28/23 0548  NA 139 139  K 3.9 3.8  CL 107 107  CO2 22 23  GLUCOSE 106* 120*  BUN 15 12  CREATININE 1.27* 1.21  CALCIUM 9.2 8.8*   LFT Recent Labs    08/27/23 1253  PROT 8.1  ALBUMIN 3.6  AST 21  ALT 11  ALKPHOS 47  BILITOT 0.7   PT/INR Recent Labs    08/28/23 0548  LABPROT 14.5  INR 1.1    Studies/Results: CT ABDOMEN PELVIS W CONTRAST Result Date: 08/27/2023 CLINICAL DATA:  Epigastric pain Large hiatal hernia, prior gastric volvulus. Rule out obstruction. EXAM: CT ABDOMEN AND PELVIS WITH CONTRAST TECHNIQUE: Multidetector CT imaging of the abdomen and pelvis was performed using the standard protocol following bolus administration of intravenous contrast. RADIATION DOSE REDUCTION: This exam was performed according to the departmental dose-optimization program which includes automated exposure control, adjustment of the mA and/or kV according to patient size and/or use of iterative reconstruction technique. CONTRAST:  OMNIPAQUE IOHEXOL 300 MG/ML  SOLN COMPARISON:  CT scan abdomen and pelvis from 08/11/2023.  The FINDINGS: Lower chest: There are reticulations in the visualized bilateral lungs, favoring pulmonary fibrosis. Bronchiectatic changes noted in the right lung base. However, no honeycombing. No pleural effusion. The heart is normal in size. No pericardial effusion. Hepatobiliary: The liver is normal in size. Non-cirrhotic configuration. No suspicious mass. These is mild diffuse hepatic steatosis. No intrahepatic or extrahepatic bile duct dilation. No calcified gallstones. Normal gallbladder  wall thickness. No pericholecystic inflammatory changes. Pancreas: Unremarkable. No pancreatic ductal dilatation or surrounding inflammatory changes. Spleen: Within normal limits. No focal lesion. Adrenals/Urinary Tract: Adrenal glands are unremarkable. No suspicious renal mass. There is a partially exophytic 4.1 x 4.1 cm simple cyst arising  from the right kidney interpolar region, anteriorly. There is a 4 mm nonobstructing calculus in the right kidney lower pole calyx. No other nephroureterolithiasis on either side. Urinary bladder is under distended, precluding optimal assessment. However, no large mass or stones identified. No perivesical fat stranding. 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 However, no abnormal wall thickening, perigastric fat stranding or abnormal distension to suggest gastric outlet obstruction. The configuration of hiatal hernia is essentially unchanged since the prior study. No disproportionate dilation of the small or large bowel loops. No evidence of abnormal bowel wall thickening or inflammatory changes. The appendix is unremarkable. There are multiple diverticula mainly in the sigmoid colon, without imaging signs of diverticulitis. Vascular/Lymphatic: No ascites or pneumoperitoneum. No abdominal or pelvic lymphadenopathy, by size criteria. No aneurysmal dilation of the major abdominal arteries. There are moderate peripheral atherosclerotic vascular calcifications of the aorta and its major branches. Reproductive: Normal size prostate. Symmetric seminal vesicles. Other: The visualized soft tissues and abdominal wall are unremarkable. Musculoskeletal: No suspicious osseous lesions. There are mild - moderate multilevel degenerative changes in the visualized spine. IMPRESSION: 1. No acute inflammatory process identified within the abdomen or pelvis. 2. 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 No evidence of gastric outlet obstruction. 3. Multiple other nonacute observations, as described above. Aortic Atherosclerosis (ICD10-I70.0). Electronically Signed   By: Jules Schick M.D.   On: 08/27/2023 16:57   DG Abd Portable 1 View Result Date: 08/27/2023 CLINICAL DATA:  Abdominal pain. EXAM: PORTABLE ABDOMEN - 1 VIEW  COMPARISON:  10/01/2020. FINDINGS: The bowel gas pattern is non-obstructive. No evidence of pneumoperitoneum, within the limitations of a supine film. No acute osseous abnormalities. There is a 3 mm calcification overlying the right renal shadow region, which can be seen with a renal calculus. The soft tissues are otherwise within normal limits. Surgical changes, devices, tubes and lines: None. IMPRESSION: Nonobstructive bowel gas pattern. Electronically Signed   By: Jules Schick M.D.   On: 08/27/2023 16:38    Endoscopic Studies: None   Clinical Impression   It is my clinical impression that Chris [name] is an  83 y.o. male with a large hiatal hernia and chronic volvulus admitted to the hospital with progressive esophageal and epigastric pain, nausea, vomiting and poor oral intake.  Repeat CT imaging this admission shows stable configuration of his large hiatal hernia and chronic volvulus -no acute changes, evidence of peritonitis or ischemia.  He was previously seen by Dr. Michaell Cowing as an outpatient with discussion regarding laparoscopic or robotic hiatal hernia repair.  He is being followed by general surgery in the hospital and is now in agreement with future surgery once Dr. Michaell Cowing is available.  Surgery notes indicate that a preoperative EGD is requested to rule out additional pathology which is certainly reasonable.  Records document that he is on Plavix for history of CABG with multiple PCI's.  Our team's initial consult note from 08/27/2023 states that his last dose of Plavix may have been 3 or 4 days prior to that date (3/31 or 4/1).  Chris [name] is vague during discussion regarding confirmation of the last day he took his anticoagulation.  Discussed with Mr.  [name] that in light of his history of taking an anticoagulant and absence of a current emergency, his procedure would most likely be scheduled for Monday 08/30/23.  Given that he is overall stable I think it is fine to attempt a trial of clear liquids today.   Advised that if he begins vomiting or has worsening pain he should cease oral intake.   Plan  Plan for EGD 08/30/2023 May trial a clear liquid diet today -please make n.p.o. after midnight in anticipation of EGD 08/30/2023 He has been without significant nutrition for almost a week or more and endorses significant weight loss.  In the setting of pending surgery he may not be able to resume oral intake in the near future.  If surgery anticipates a prolonged duration of n.p.o. stop, can consider interim PPN or TPN if felt appropriate. Continue pantoprazole 40 mg IV twice daily Continue ondansetron 4 mg IV every 6 hours as needed for nausea and vomiting Appreciate general surgery recommendations and will coordinate care with our surgical colleagues. Continue holding Plavix.  Dr. Leone Payor will assume rounding responsibilities 08/30/23  Chris [name]  08/29/2023, 1:49 PM  Chris Beach, MD [facility]

## 2023-08-29 NOTE — Progress Notes (Signed)
 Inpatient Progress Note     Patient Profile/Chief Complaint   83 y.o. 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   Mr. Garrelts is admitted to the hospital with worsening chest/epigastric pain, vomiting precluding nutrition and fluid intake.  CT imaging consistent with stable chronic large hiatal hernia and chronic volvulus.   Interval History   -- No acute events overnight -- Continues to have some left chest discomfort but less than previous -- Requests to to try to eat or drink today -- Has not had significant nutrition for several days; endorses significant weight loss > 10 lbs    Objective   Vital signs in last 24 hours: Temp:  [97.5 F (36.4 C)-98.7 F (37.1 C)] 98 F (36.7 C) (04/06 1205) Pulse Rate:  [46-68] 60 (04/06 1205) Resp:  [15-16] 16 (04/06 0557) BP: (103-128)/(52-78) 103/70 (04/06 1205) SpO2:  [92 %-97 %] 97 % (04/06 1205) Last BM Date : 08/24/23 General:    Elderly male resting in bed in no distress Chest: Well-healed scar across chest Heart:  Regular rate and rhythm; no murmurs Lungs: Respirations even and unlabored, lungs CTA bilaterally Abdomen:  Soft, minimal tenderness in the epigastrium and nondistended. Normal bowel sounds. Extremities:  Without edema. Neurologic:  Alert and oriented,  grossly normal neurologically. Psych:  Cooperative. Normal mood and affect.  Intake/Output from previous day: 04/05 0701 - 04/06 0700 In: 837.7 [I.V.:837.7] Out: -  Intake/Output this shift: Total I/O In: 240 [P.O.:240] Out: -   Lab Results: Recent Labs    08/27/23 1253 08/28/23 0548  WBC 6.7 6.1  HGB 10.2* 9.2*  HCT 36.3* 33.7*  PLT 352  291   BMET Recent Labs    08/27/23 1253 08/28/23 0548  NA 139 139  K 3.9 3.8  CL 107 107  CO2 22 23  GLUCOSE 106* 120*  BUN 15 12  CREATININE 1.27* 1.21  CALCIUM 9.2 8.8*   LFT Recent Labs    08/27/23 1253  PROT 8.1  ALBUMIN 3.6  AST 21  ALT 11  ALKPHOS 47  BILITOT 0.7   PT/INR Recent Labs    08/28/23 0548  LABPROT 14.5  INR 1.1    Studies/Results: CT ABDOMEN PELVIS W CONTRAST Result Date: 08/27/2023 CLINICAL DATA:  Epigastric pain Large hiatal hernia, prior gastric volvulus. Rule out obstruction. EXAM: CT ABDOMEN AND PELVIS WITH CONTRAST TECHNIQUE: Multidetector CT imaging of the abdomen and pelvis was performed using the standard protocol following bolus administration of intravenous contrast. RADIATION DOSE REDUCTION: This exam was performed according to the departmental dose-optimization program which includes automated exposure control, adjustment of the mA and/or kV according to patient size and/or use of iterative reconstruction technique. CONTRAST:  OMNIPAQUE IOHEXOL 300 MG/ML  SOLN COMPARISON:  CT scan abdomen and pelvis from 08/11/2023.  The FINDINGS: Lower chest: There are reticulations in the visualized bilateral lungs, favoring pulmonary fibrosis. Bronchiectatic changes noted in the right lung base. However, no honeycombing. No pleural effusion. The heart is normal in size. No pericardial effusion. Hepatobiliary: The liver is normal in size. Non-cirrhotic configuration. No suspicious mass. These is mild diffuse hepatic steatosis. No intrahepatic or extrahepatic bile duct dilation. No calcified gallstones. Normal gallbladder  wall thickness. No pericholecystic inflammatory changes. Pancreas: Unremarkable. No pancreatic ductal dilatation or surrounding inflammatory changes. Spleen: Within normal limits. No focal lesion. Adrenals/Urinary Tract: Adrenal glands are unremarkable. No suspicious renal mass. There is a partially exophytic 4.1 x 4.1 cm simple cyst arising  from the right kidney interpolar region, anteriorly. There is a 4 mm nonobstructing calculus in the right kidney lower pole calyx. No other nephroureterolithiasis on either side. Urinary bladder is under distended, precluding optimal assessment. However, no large mass or stones identified. No perivesical fat stranding. 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 However, no abnormal wall thickening, perigastric fat stranding or abnormal distension to suggest gastric outlet obstruction. The configuration of hiatal hernia is essentially unchanged since the prior study. No disproportionate dilation of the small or large bowel loops. No evidence of abnormal bowel wall thickening or inflammatory changes. The appendix is unremarkable. There are multiple diverticula mainly in the sigmoid colon, without imaging signs of diverticulitis. Vascular/Lymphatic: No ascites or pneumoperitoneum. No abdominal or pelvic lymphadenopathy, by size criteria. No aneurysmal dilation of the major abdominal arteries. There are moderate peripheral atherosclerotic vascular calcifications of the aorta and its major branches. Reproductive: Normal size prostate. Symmetric seminal vesicles. Other: The visualized soft tissues and abdominal wall are unremarkable. Musculoskeletal: No suspicious osseous lesions. There are mild - moderate multilevel degenerative changes in the visualized spine. IMPRESSION: 1. No acute inflammatory process identified within the abdomen or pelvis. 2. 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 No evidence of gastric outlet obstruction. 3. Multiple other nonacute observations, as described above. Aortic Atherosclerosis (ICD10-I70.0). Electronically Signed   By: Jules Schick M.D.   On: 08/27/2023 16:57   DG Abd Portable 1 View Result Date: 08/27/2023 CLINICAL DATA:  Abdominal pain. EXAM: PORTABLE ABDOMEN - 1 VIEW  COMPARISON:  10/01/2020. FINDINGS: The bowel gas pattern is non-obstructive. No evidence of pneumoperitoneum, within the limitations of a supine film. No acute osseous abnormalities. There is a 3 mm calcification overlying the right renal shadow region, which can be seen with a renal calculus. The soft tissues are otherwise within normal limits. Surgical changes, devices, tubes and lines: None. IMPRESSION: Nonobstructive bowel gas pattern. Electronically Signed   By: Jules Schick M.D.   On: 08/27/2023 16:38    Endoscopic Studies: None   Clinical Impression   It is my clinical impression that Mr. Machuca is an  83 y.o. male with a large hiatal hernia and chronic volvulus admitted to the hospital with progressive esophageal and epigastric pain, nausea, vomiting and poor oral intake.  Repeat CT imaging this admission shows stable configuration of his large hiatal hernia and chronic volvulus -no acute changes, evidence of peritonitis or ischemia.  He was previously seen by Dr. Michaell Cowing as an outpatient with discussion regarding laparoscopic or robotic hiatal hernia repair.  He is being followed by general surgery in the hospital and is now in agreement with future surgery once Dr. Michaell Cowing is available.  Surgery notes indicate that a preoperative EGD is requested to rule out additional pathology which is certainly reasonable.  Records document that he is on Plavix for history of CABG with multiple PCI's.  Our team's initial consult note from 08/27/2023 states that his last dose of Plavix may have been 3 or 4 days prior to that date (3/31 or 4/1).  Mr. Brennen is vague during discussion regarding confirmation of the last day he took his anticoagulation.  Discussed with Mr.  Leavy that in light of his history of taking an anticoagulant and absence of a current emergency, his procedure would most likely be scheduled for Monday 08/30/23.  Given that he is overall stable I think it is fine to attempt a trial of clear liquids today.   Advised that if he begins vomiting or has worsening pain he should cease oral intake.   Plan  Plan for EGD 08/30/2023 May trial a clear liquid diet today -please make n.p.o. after midnight in anticipation of EGD 08/30/2023 He has been without significant nutrition for almost a week or more and endorses significant weight loss.  In the setting of pending surgery he may not be able to resume oral intake in the near future.  If surgery anticipates a prolonged duration of n.p.o. stop, can consider interim PPN or TPN if felt appropriate. Continue pantoprazole 40 mg IV twice daily Continue ondansetron 4 mg IV every 6 hours as needed for nausea and vomiting Appreciate general surgery recommendations and will coordinate care with our surgical colleagues. Continue holding Plavix.  Dr. Leone Payor will assume rounding responsibilities 08/30/23  Durene Romans Tecora Eustache  08/29/2023, 1:49 PM  Maren Beach, MD [facility]

## 2023-08-29 NOTE — Progress Notes (Signed)
 PROGRESS NOTE  [name].  WNU:[id number] DOB: 11/29/1940 DOA: 08/27/2023 PCP: Donita Brooks, MD   Brief Narrative: Patient is a 83 year old male with history of hiatal hernia who presented with complaint of difficulty swallowing, epigastric discomfort, nausea, vomiting for 2 weeks.  On presentation, he was hemodynamically stable.  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  General surgery, GI consulted and following.Plan for EGD  Assessment & Plan:  Principal Problem:   Hiatal hernia Active Problems:   Syncope   Renal mass  Hiatal hernia: Previously known.  Presented with difficulty swallowing, nausea, vomiting, epigastric discomfort.  Multiple hospitalization in the past for the same.  General surgery, GI consulted. CT abdomen/pelvis did not show obstruction from his hernia. GI also following.  Recommended to continue Protonix, ., IV fluid.  Plan for EGD in 08/30/2023.  On clear liquid diet today.  General surgery also following for possible plan for robotic/laparoscopic hiatal hernia repair, early next week.  Renal cyst: No suspicious renal mass. There is a partially exophytic 4.1 x 4.1 cm simple cyst arising from the right kidney interpolar region, anteriorly. There is a 4 mm nonobstructing calculus in the right kidney lower pole calyx.Monitor as outpatient  History of syncope: Happened about 2 weeks ago.  Continue telemetry.  PT/OT will be consulted after surgical intervention.  History of HFrEF: Currently euvolemic.  Last echo showed EF of 35 to 40%, regional wall motion abnormality.  Follows with Dr. Jacinto Halim  History of pulmonary fibrosis:Follows with pulmonology        DVT prophylaxis:fondaparinux (ARIXTRA) injection 2.5 mg Start: 08/28/23 0800 Place and maintain sequential compression device Start: 08/27/23 1756 SCDs Start: 08/27/23 1741     Code Status: Full Code  Family  Communication: None at bedside  Patient status:Inpatient  Patient is from :home  Anticipated discharge IH:KVQQ  Estimated DC date:1-2 days   Consultants: None  Procedures:None  Antimicrobials:  Anti-infectives (From admission, onward)    None       Subjective: Patient seen and examined at bedside today.  Complains of abdominal discomfort but his abdomen is soft and nondistended and has good bowel sounds.  No nausea or vomiting.  Says he is hungry.  Started on clear liquid diet.  Passing gas but no bowel movement  Objective: Vitals:   08/28/23 0631 08/28/23 1400 08/28/23 2013 08/29/23 0557  BP: 113/61 (!) 114/52 128/78 115/67  Pulse: 60 (!) 46 68 68  Resp: 16 15 16 16   Temp: 98 F (36.7 C) 98.7 F (37.1 C) 98.7 F (37.1 C) (!) 97.5 F (36.4 C)  TempSrc: Oral Oral Oral Oral  SpO2: 93% 94% 93% 92%  Weight:      Height:        Intake/Output Summary (Last 24 hours) at 08/29/2023 1122 Last data filed at 08/28/2023 2105 Gross per 24 hour  Intake 837.67 ml  Output --  Net 837.67 ml   Filed Weights   08/27/23 1205  Weight: 91.2 kg    Examination:  General exam: Overall comfortable, not in distress, pleasant elderly male HEENT: PERRL Respiratory system: Crackles  on bilateral bases Cardiovascular system: S1 & S2 heard, RRR.  Gastrointestinal system: Abdomen is nondistended, soft and nontender. Central nervous system: Alert and oriented Extremities: No edema, no clubbing ,no cyanosis Skin: No rashes, no ulcers,no icterus     Data Reviewed: I have personally reviewed following labs and imaging studies  CBC: Recent Labs  Lab 08/27/23 1253 08/28/23 0548  WBC 6.7 6.1  NEUTROABS 3.3  --   HGB 10.2* 9.2*  HCT 36.3* 33.7*  MCV 72.7* 74.6*  PLT 352 291   Basic Metabolic Panel: Recent Labs  Lab 08/27/23 1253 08/28/23 0548  NA 139 139  K 3.9 3.8  CL 107 107  CO2 22 23  GLUCOSE 106* 120*  BUN 15 12  CREATININE 1.27* 1.21  CALCIUM 9.2 8.8*  MG 2.5*   --      No results found for this or any previous visit (from the past 240 hours).   Radiology Studies: CT ABDOMEN PELVIS W CONTRAST Result Date: 08/27/2023 CLINICAL DATA:  Epigastric pain Large hiatal hernia, prior gastric volvulus. Rule out obstruction. EXAM: CT ABDOMEN AND PELVIS WITH CONTRAST TECHNIQUE: Multidetector CT imaging of the abdomen and pelvis was performed using the standard protocol following bolus administration of intravenous contrast. RADIATION DOSE REDUCTION: This exam was performed according to the departmental dose-optimization program which includes automated exposure control, adjustment of the mA and/or kV according to patient size and/or use of iterative reconstruction technique. CONTRAST:  OMNIPAQUE IOHEXOL 300 MG/ML  SOLN COMPARISON:  CT scan abdomen and pelvis from 08/11/2023.  The FINDINGS: Lower chest: There are reticulations in the visualized bilateral lungs, favoring pulmonary fibrosis. Bronchiectatic changes noted in the right lung base. However, no honeycombing. No pleural effusion. The heart is normal in size. No pericardial effusion. Hepatobiliary: The liver is normal in size. Non-cirrhotic configuration. No suspicious mass. These is mild diffuse hepatic steatosis. No intrahepatic or extrahepatic bile duct dilation. No calcified gallstones. Normal gallbladder wall thickness. No pericholecystic inflammatory changes. Pancreas: Unremarkable. No pancreatic ductal dilatation or surrounding inflammatory changes. Spleen: Within normal limits. No focal lesion. Adrenals/Urinary Tract: Adrenal glands are unremarkable. No suspicious renal mass. There is a partially exophytic 4.1 x 4.1 cm simple cyst arising from the right kidney interpolar region, anteriorly. There is a 4 mm nonobstructing calculus in the right kidney lower pole calyx. No other nephroureterolithiasis on either side. Urinary bladder is under distended, precluding optimal assessment. However, no large mass or  stones identified. No perivesical fat stranding. 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 However, no abnormal wall thickening, perigastric fat stranding or abnormal distension to suggest gastric outlet obstruction. The configuration of hiatal hernia is essentially unchanged since the prior study. No disproportionate dilation of the small or large bowel loops. No evidence of abnormal bowel wall thickening or inflammatory changes. The appendix is unremarkable. There are multiple diverticula mainly in the sigmoid colon, without imaging signs of diverticulitis. Vascular/Lymphatic: No ascites or pneumoperitoneum. No abdominal or pelvic lymphadenopathy, by size criteria. No aneurysmal dilation of the major abdominal arteries. There are moderate peripheral atherosclerotic vascular calcifications of the aorta and its major branches. Reproductive: Normal size prostate. Symmetric seminal vesicles. Other: The visualized soft tissues and abdominal wall are unremarkable. Musculoskeletal: No suspicious osseous lesions. There are mild - moderate multilevel degenerative changes in the visualized spine. IMPRESSION: 1. No acute inflammatory process identified within the abdomen or pelvis. 2. 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 No evidence of gastric outlet obstruction. 3. Multiple other nonacute observations, as described above. Aortic Atherosclerosis (ICD10-I70.0). Electronically Signed   By: Jules Schick M.D.   On: 08/27/2023 16:57   DG Abd Portable 1 View Result Date: 08/27/2023 CLINICAL DATA:  Abdominal pain. EXAM: PORTABLE ABDOMEN - 1 VIEW COMPARISON:  10/01/2020. FINDINGS: The bowel gas  pattern is non-obstructive. No evidence of pneumoperitoneum, within the limitations of a supine film. No acute osseous abnormalities. There is a 3 mm calcification overlying the right renal shadow region, which can be seen  with a renal calculus. The soft tissues are otherwise within normal limits. Surgical changes, devices, tubes and lines: None. IMPRESSION: Nonobstructive bowel gas pattern. Electronically Signed   By: Jules Schick M.D.   On: 08/27/2023 16:38   DG Chest 2 View Result Date: 08/27/2023 CLINICAL DATA:  Chest pain.  Difficulty swallowing. EXAM: CHEST - 2 VIEW COMPARISON:  Chest radiograph dated 06/24/2023. FINDINGS: The heart size and mediastinal contours are unchanged. Prior median sternotomy and CABG. Large hiatal hernia again noted. Bilateral basilar predominant chronic interstitial changes are again noted. No appreciable new focal consolidation, pleural effusion, or pneumothorax. No acute osseous abnormality. IMPRESSION: 1. 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 2. Similar large hiatal hernia. Electronically Signed   By: Hart Robinsons M.D.   On: 08/27/2023 13:35    Scheduled Meds:  fondaparinux (ARIXTRA) injection  2.5 mg Subcutaneous Q24H   pantoprazole (PROTONIX) IV  40 mg Intravenous Q12H   sodium chloride flush  3 mL Intravenous Q12H   Continuous Infusions:  dextrose 5% lactated ringers 75 mL/hr at 08/28/23 2016     LOS: 2 days   Burnadette Pop, MD [facility] P4/10/2023, 11:22 AM

## 2023-08-30 ENCOUNTER — Encounter (HOSPITAL_COMMUNITY): Payer: Self-pay | Admitting: Internal Medicine

## 2023-08-30 ENCOUNTER — Inpatient Hospital Stay (HOSPITAL_COMMUNITY): Admitting: Anesthesiology

## 2023-08-30 ENCOUNTER — Encounter (HOSPITAL_COMMUNITY)
Admission: EM | Disposition: A | Discharge status: Home / Self Care | Payer: Self-pay | Source: Home / Self Care | Attending: Internal Medicine

## 2023-08-30 DIAGNOSIS — K449 Diaphragmatic hernia without obstruction or gangrene: Secondary | ICD-10-CM

## 2023-08-30 DIAGNOSIS — R1013 Epigastric pain: Secondary | ICD-10-CM

## 2023-08-30 DIAGNOSIS — R112 Nausea with vomiting, unspecified: Secondary | ICD-10-CM | POA: Diagnosis not present

## 2023-08-30 HISTORY — PX: ESOPHAGOGASTRODUODENOSCOPY: SHX5428

## 2023-08-30 SURGERY — EGD (ESOPHAGOGASTRODUODENOSCOPY)
Anesthesia: Monitor Anesthesia Care

## 2023-08-30 MED ORDER — PROPOFOL 500 MG/50ML IV EMUL
INTRAVENOUS | Status: DC | PRN
  Administered 2023-08-30: 150 ug/kg/min via INTRAVENOUS

## 2023-08-30 MED ORDER — SODIUM CHLORIDE 0.9 % IV SOLN: INTRAVENOUS | Status: AC

## 2023-08-30 MED ORDER — PROPOFOL 10 MG/ML IV BOLUS
INTRAVENOUS | Status: DC | PRN
Start: 2023-08-30 — End: 2023-08-30
  Administered 2023-08-30: 20 mg via INTRAVENOUS

## 2023-08-30 MED ORDER — LIDOCAINE 2% (20 MG/ML) 5 ML SYRINGE
INTRAMUSCULAR | Status: DC | PRN
  Administered 2023-08-30: 40 mg via INTRAVENOUS

## 2023-08-30 NOTE — Progress Notes (Signed)
 PROGRESS NOTE  [name].  ZOX:[id number] DOB: [birth year] DOA: 08/27/2023 PCP: Donita Brooks, MD   Brief Narrative: Patient is a 83 year old male with history of hiatal hernia who presented with complaint of difficulty swallowing, epigastric discomfort, nausea, vomiting for 2 weeks.  On presentation, he was hemodynamically stable.  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  General surgery, GI consulted and following.Plan for EGD today  Assessment & Plan:  Principal Problem:   Hiatal hernia Active Problems:   Syncope   Renal mass  Hiatal hernia: Previously known.  Presented with difficulty swallowing, nausea, vomiting, epigastric discomfort.  Multiple hospitalization in the past for the same.  General surgery, GI consulted. CT abdomen/pelvis did not show obstruction from his hernia. GI also following.   Plan for EGD in 08/30/2023.   General surgery also following for possible plan for robotic/laparoscopic hiatal hernia repair. Continue Protonix  Renal cyst: No suspicious renal mass. There is a partially exophytic 4.1 x 4.1 cm simple cyst arising from the right kidney interpolar region, anteriorly. There is a 4 mm nonobstructing calculus in the right kidney lower pole calyx.Monitor as outpatient  History of syncope: Happened about 2 weeks ago.  Continue telemetry.  PT/OT will be consulted after surgical intervention.  History of HFrEF: Currently euvolemic.  Last echo showed EF of 35 to 40%, regional wall motion abnormality.  Follows with Dr. Jacinto Halim  History of pulmonary fibrosis:Follows with pulmonology        DVT prophylaxis:fondaparinux (ARIXTRA) injection 2.5 mg Start: 08/28/23 0800 Place and maintain sequential compression device Start: 08/27/23 1756 SCDs Start: 08/27/23 1741     Code Status: Full Code  Family Communication: None at bedside  Patient status:Inpatient  Patient  is from :home  Anticipated discharge WJ:[id number]  Estimated DC date:1-2 days   Consultants: None  Procedures:None  Antimicrobials:  Anti-infectives (From admission, onward)    None       Subjective: Patient seen and examined at bedside today.  Hemodynamically stable.  Comfortably sleeping on the bed.  No abdominal pain, nausea or vomiting today.Wiaiting for EGD  Objective: Vitals:   08/29/23 1942 08/30/23 0317 08/30/23 0952 08/30/23 1132  BP: (!) 146/88 128/73 (!) 129/47   Pulse: 75 (!) 58 60 (!) 51  Resp: 17 17 12 17   Temp: 97.7 F (36.5 C) 97.8 F (36.6 C) 97.6 F (36.4 C)   TempSrc:   Temporal Temporal  SpO2: 97% 93% 98% 93%  Weight:      Height:        Intake/Output Summary (Last 24 hours) at 08/30/2023 1136 Last data filed at 08/30/2023 0600 Gross per 24 hour  Intake 430.66 ml  Output --  Net 430.66 ml   Filed Weights   08/27/23 1205  Weight: 91.2 kg    Examination:   General exam: Overall comfortable, not in distress HEENT: PERRL Respiratory system: Crackles on bilateral bases Cardiovascular system: S1 & S2 heard, RRR.  Gastrointestinal system: Abdomen is nondistended, soft and nontender. Central nervous system: Alert and oriented Extremities: No edema, no clubbing ,no cyanosis Skin: No rashes, no ulcers,no icterus     Data Reviewed: I have personally reviewed following labs and imaging studies  CBC: Recent Labs  Lab 08/27/23 1253 08/28/23 0548  WBC 6.7 6.1  NEUTROABS 3.3  --   HGB 10.2* 9.2*  HCT 36.3* 33.7*  MCV 72.7* 74.6*  PLT 352 291   Basic Metabolic Panel: Recent Labs  Lab 08/27/23 1253 08/28/23 0548  NA 139 139  K 3.9 3.8  CL 107 107  CO2 22 23  GLUCOSE 106* 120*  BUN 15 12  CREATININE 1.27* 1.21  CALCIUM 9.2 8.8*  MG 2.5*  --      No results found for this or any previous visit (from the past 240 hours).   Radiology Studies: No results found.   Scheduled Meds:  [MAR Hold] fondaparinux (ARIXTRA) injection  2.5  mg Subcutaneous Q24H   [MAR Hold] pantoprazole (PROTONIX) IV  40 mg Intravenous Q12H   [MAR Hold] sodium chloride flush  3 mL Intravenous Q12H   Continuous Infusions:  sodium chloride 40 mL/hr at 08/30/23 1107     LOS: 3 days   Burnadette Pop, MD [facility] P4/11/2023, 11:36 AM

## 2023-08-30 NOTE — Progress Notes (Signed)
   Subjective/Chief Complaint: Pt reports sleeping well last night Denies abdominal pain or chest pain    Objective: Vital signs in last 24 hours: Temp:  [97.7 F (36.5 C)-98 F (36.7 C)] 97.8 F (36.6 C) (04/07 0317) Pulse Rate:  [58-75] 58 (04/07 0317) Resp:  [17] 17 (04/07 0317) BP: (103-146)/(70-88) 128/73 (04/07 0317) SpO2:  [93 %-97 %] 93 % (04/07 0317) Last BM Date : 08/24/23  Intake/Output from previous day: 04/06 0701 - 04/07 0700 In: 430.7 [P.O.:290; I.V.:140.7] Out: -  Intake/Output this shift: No intake/output data recorded.  Exam: Awake and alert Looks comfortable  Abdomen soft, non-tender  Lab Results:  Recent Labs    08/27/23 1253 08/28/23 0548  WBC 6.7 6.1  HGB 10.2* 9.2*  HCT 36.3* 33.7*  PLT 352 291   BMET Recent Labs    08/27/23 1253 08/28/23 0548  NA 139 139  K 3.9 3.8  CL 107 107  CO2 22 23  GLUCOSE 106* 120*  BUN 15 12  CREATININE 1.27* 1.21  CALCIUM 9.2 8.8*   PT/INR Recent Labs    08/28/23 0548  LABPROT 14.5  INR 1.1   ABG No results for input(s): "PHART", "HCO3" in the last 72 hours.  Invalid input(s): "PCO2", "PO2"  Studies/Results: No results found.   Anti-infectives: Anti-infectives (From admission, onward)    None       Assessment/Plan: Large hiatal hernia  -Dr. Michaell Cowing recommended a preop EGD as well as cardiac clearance when the patient saw him in the office last week -EGD planned for today -further surgical plans to follow. -apparently there is a cards clearance note in letters in [facility] at our office.  I can't see that from here.  FEN - NPO VTE - plavix on hold, Arixtra being given currently ID - none currently  HF with EF of 35-40% Pulmonary fibrosis Renal mass   LOS: 3 days    Letha Cape 08/30/2023

## 2023-08-30 NOTE — Telephone Encounter (Signed)
 Will update the preop APP Dr. Jacinto Halim sent his letter to requesting office he cleared the pt.

## 2023-08-30 NOTE — Telephone Encounter (Signed)
 Left message on machine to call back

## 2023-08-30 NOTE — Plan of Care (Signed)
  Problem: Education: Goal: Knowledge of General Education information will improve Description Including pain rating scale, medication(s)/side effects and non-pharmacologic comfort measures Outcome: Progressing   Problem: Health Behavior/Discharge Planning: Goal: Ability to manage health-related needs will improve Outcome: Progressing   Problem: Nutrition: Goal: Adequate nutrition will be maintained Outcome: Progressing   Problem: Coping: Goal: Level of anxiety will decrease Outcome: Progressing   Problem: Elimination: Goal: Will not experience complications related to urinary retention Outcome: Progressing   Problem: Safety: Goal: Ability to remain free from injury will improve Outcome: Progressing   Problem: Skin Integrity: Goal: Risk for impaired skin integrity will decrease Outcome: Progressing

## 2023-08-30 NOTE — Discharge Instructions (Signed)
 YOU HAD AN ENDOSCOPIC PROCEDURE TODAY: Refer to the procedure report and other information in the discharge instructions given to you for any specific questions about what was found during the examination. If this information does not answer your questions, please call [facility] office at [phone] to clarify.   YOU SHOULD EXPECT: Some feelings of bloating in the abdomen. Passage of more gas than usual. Walking can help get rid of the air that was put into your GI tract during the procedure and reduce the bloating. If you had a lower endoscopy (such as a colonoscopy or flexible sigmoidoscopy) you may notice spotting of blood in your stool or on the toilet paper. Some abdominal soreness may be present for a day or two, also.  DIET: Your first meal following the procedure should be a light meal and then it is ok to progress to your normal diet. A half-sandwich or bowl of soup is an example of a good first meal. Heavy or fried foods are harder to digest and may make you feel nauseous or bloated. Drink plenty of fluids but you should avoid alcoholic beverages for 24 hours. If you had a esophageal dilation, please see attached instructions for diet.    ACTIVITY: Your care partner should take you home directly after the procedure. You should plan to take it easy, moving slowly for the rest of the day. You can resume normal activity the day after the procedure however YOU SHOULD NOT DRIVE, use power tools, machinery or perform tasks that involve climbing or major physical exertion for 24 hours (because of the sedation medicines used during the test).   SYMPTOMS TO REPORT IMMEDIATELY: A gastroenterologist can be reached at any hour. Please call [phone]  for any of the following symptoms:  Following upper endoscopy (EGD, EUS, ERCP, esophageal dilation) Vomiting of blood or coffee ground material  New, significant abdominal pain  New, significant chest pain or pain under the shoulder blades  Painful or  persistently difficult swallowing  New shortness of breath  Black, tarry-looking or red, bloody stools  FOLLOW UP:  If any biopsies were taken you will be contacted by phone or by letter within the next 1-3 weeks. Call [phone]  if you have not heard about the biopsies in 3 weeks.  Please also call with any specific questions about appointments or follow up tests.

## 2023-08-30 NOTE — Anesthesia Preprocedure Evaluation (Signed)
 Anesthesia Evaluation  Patient identified by MRN, date of birth, ID band Patient awake    Reviewed: Allergy & Precautions, NPO status , Patient's Chart, lab work & pertinent test results  Airway Mallampati: II  TM Distance: >3 FB Neck ROM: Full    Dental  (+) Dental Advisory Given, Poor Dentition, Missing   Pulmonary sleep apnea , former smoker   Pulmonary exam normal breath sounds clear to auscultation       Cardiovascular hypertension, + CAD, + Past MI, + CABG, +CHF and + DOE  Normal cardiovascular exam+ dysrhythmias (RBBB)  Rhythm:Regular Rate:Normal     Neuro/Psych TIA Neuromuscular disease CVA    GI/Hepatic Neg liver ROS, hiatal hernia,GERD  Medicated,,  Endo/Other  diabetes, Type 2, Oral Hypoglycemic Agents    Renal/GU Renal InsufficiencyRenal disease     Musculoskeletal  (+) Arthritis ,    Abdominal   Peds  Hematology  (+) Blood dyscrasia (Plavix)   Anesthesia Other Findings Day of surgery medications reviewed with the patient.  Reproductive/Obstetrics                             Anesthesia Physical Anesthesia Plan  ASA: 3  Anesthesia Plan: MAC   Post-op Pain Management: Minimal or no pain anticipated   Induction: Intravenous  PONV Risk Score and Plan: 1 and TIVA and Treatment may vary due to age or medical condition  Airway Management Planned: Natural Airway and Simple Face Mask  Additional Equipment:   Intra-op Plan:   Post-operative Plan:   Informed Consent: I have reviewed the patients History and Physical, chart, labs and discussed the procedure including the risks, benefits and alternatives for the proposed anesthesia with the patient or authorized representative who has indicated his/her understanding and acceptance.     Dental advisory given  Plan Discussed with: CRNA  Anesthesia Plan Comments:        Anesthesia Quick Evaluation

## 2023-08-30 NOTE — Anesthesia Postprocedure Evaluation (Signed)
 Anesthesia Post Note  Patient: Chris [name].  Procedure(s) Performed: EGD (ESOPHAGOGASTRODUODENOSCOPY)     Patient location during evaluation: PACU Anesthesia Type: MAC Level of consciousness: awake and alert Pain management: pain level controlled Vital Signs Assessment: post-procedure vital signs reviewed and stable Respiratory status: spontaneous breathing, nonlabored ventilation and respiratory function stable Cardiovascular status: stable and blood pressure returned to baseline Postop Assessment: no apparent nausea or vomiting Anesthetic complications: no   No notable events documented.  Last Vitals:  Vitals:   08/30/23 1210 08/30/23 1408  BP: 114/71 120/78  Pulse: (!) 50 62  Resp:  18  Temp:  36.8 C  SpO2: 99% 97%    Last Pain:  Vitals:   08/30/23 1408  TempSrc: Oral  PainSc:                  Collene Schlichter

## 2023-08-30 NOTE — Op Note (Signed)
 [facility] Patient Name: Chris [name] Procedure Date: 08/30/2023 MRN: [id number] Attending MD: Beverley Fiedler , MD, [id number] Date of Birth: 28-Dec-1940 CSN: [id number] Age: 83 Admit Type: Outpatient Procedure:                Upper GI endoscopy Indications:              Epigastric abdominal pain, Nausea with vomiting Providers:                Carie Caddy. Rhea Belton, MD, Stephens Shire RN, RN, Melany Guernsey, Technician Referring MD:             [facility] Medicines:                Monitored Anesthesia Care Complications:            No immediate complications. Estimated Blood Loss:     Estimated blood loss: none. Procedure:                Pre-Anesthesia Assessment:                           - Prior to the procedure, a History and Physical                            was performed, and patient medications and                            allergies were reviewed. The patient's tolerance of                            previous anesthesia was also reviewed. The risks                            and benefits of the procedure and the sedation                            options and risks were discussed with the patient.                            All questions were answered, and informed consent                            was obtained. Prior Anticoagulants: The patient has                            taken Plavix (clopidogrel), last dose was 4 days                            prior to procedure. ASA Grade Assessment: III - A                            patient with severe systemic disease. After  reviewing the risks and benefits, the patient was                            deemed in satisfactory condition to undergo the                            procedure.                           After obtaining informed consent, the endoscope was                            passed under direct vision. Throughout the                             procedure, the patient's blood pressure, pulse, and                            oxygen saturations were monitored continuously. The                            [id number]-H190 ([id number]) Olympus endoscope was introduced                            through the mouth, and advanced to the second part                            of duodenum. The upper GI endoscopy was                            accomplished without difficulty. The patient                            tolerated the procedure well. Scope In: Scope Out: Findings:      Normal mucosa was found in the entire esophagus.      A large hiatal hernia was found. The proximal extent of the gastric       folds (end of tubular esophagus) was 34 cm from the incisors. The hiatal       narrowing was 42 cm from the incisors. No evidence of gastric mucosal       injury.      Normal mucosa was found in the entire examined stomach.      The examined duodenum was normal. Impression:               - Normal mucosa was found in the entire esophagus.                           - Large hiatal hernia containing at least 1/3 of                            the stomach.                           - Normal mucosa was found in the entire stomach.                           -  Normal examined duodenum.                           - No specimens collected. Moderate Sedation:      N/A Recommendation:           - Return patient to hospital ward for ongoing care.                           - Advance diet as tolerated.                           - Continue present medications.                           - Plans per surgery for symptomatic large hiatal                            hernia. Procedure Code(s):        --- Professional ---                           [phone], Esophagogastroduodenoscopy, flexible,                            transoral; diagnostic, including collection of                            specimen(s) by brushing or washing, when performed                             (separate procedure) Diagnosis Code(s):        --- Professional ---                           K44.9, Diaphragmatic hernia without obstruction or                            gangrene                           R10.13, Epigastric pain                           R11.2, Nausea with vomiting, unspecified CPT copyright 2022 American [employer]. All rights reserved. The codes documented in this report are preliminary and upon coder review may  be revised to meet current compliance requirements. Beverley Fiedler, MD 08/30/2023 11:50:17 AM This report has been signed electronically. Number of Addenda: 0

## 2023-08-30 NOTE — Interval H&P Note (Signed)
 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 Doy Hutching yesterday  08/30/2023 10:56 AM  Chris [name].  has presented today for surgery, with the diagnosis of Hiatal hernia.  The various methods of treatment have been discussed with the patient and family. After consideration of risks, benefits and other options for treatment, the patient has consented to  Procedure(s): EGD (ESOPHAGOGASTRODUODENOSCOPY) (N/A) as a surgical intervention.  The patient's history has been reviewed, patient examined, no change in status, stable for surgery.  I have reviewed the patient's chart and labs.  Questions were answered to the patient's satisfaction.     Carie Caddy [name]

## 2023-08-30 NOTE — Progress Notes (Signed)
   08/30/23 1325  TOC Brief Assessment  Insurance and Status Reviewed  Patient has primary care physician Yes  Home environment has been reviewed Single family home  Prior level of function: Modified independent  Prior/Current Home Services No current home services  Social Drivers of Health Review SDOH reviewed no interventions necessary  Readmission risk has been reviewed Yes  Transition of care needs no transition of care needs at this time

## 2023-08-30 NOTE — Transfer of Care (Signed)
 Immediate Anesthesia Transfer of Care Note  Patient: Chris [name].  Procedure(s) Performed: EGD (ESOPHAGOGASTRODUODENOSCOPY)  Patient Location: PACU and Endoscopy Unit  Anesthesia Type:MAC  Level of Consciousness: drowsy  Airway & Oxygen Therapy: Patient Spontanous Breathing and Patient connected to face mask oxygen  Post-op Assessment: Report given to RN and Post -op Vital signs reviewed and stable  Post vital signs: Reviewed and stable  Last Vitals:  Vitals Value Taken Time  BP    Temp    Pulse 51 08/30/23 1132  Resp 17 08/30/23 1132  SpO2 93 % 08/30/23 1132  Vitals shown include unfiled device data.  Last Pain:  Vitals:   08/30/23 0952  TempSrc: Temporal  PainSc: 0-No pain         Complications: No notable events documented.

## 2023-08-31 DIAGNOSIS — K449 Diaphragmatic hernia without obstruction or gangrene: Secondary | ICD-10-CM | POA: Diagnosis not present

## 2023-08-31 NOTE — Plan of Care (Signed)

## 2023-08-31 NOTE — Telephone Encounter (Signed)
 Inbound call from patients daughter calling to let us know that patient is currently in the hospital and had an EGD. Requesting a call back to discuss. Please advise.

## 2023-08-31 NOTE — Progress Notes (Addendum)
 1 Day Post-Op   Subjective/Chief Complaint: EGD noted from yesterday.  No evidence of acute obstruction.  Tolerating CLD.  No N/V/abdominal pain  Objective: Vital signs in last 24 hours: Temp:  [97.6 F (36.4 C)-98.3 F (36.8 C)] 97.6 F (36.4 C) (04/08 0508) Pulse Rate:  [50-62] 56 (04/08 0508) Resp:  [12-21] 18 (04/08 0508) BP: (86-134)/(47-93) 134/64 (04/08 0508) SpO2:  [93 %-99 %] 96 % (04/08 0508) Last BM Date : 08/24/23  Intake/Output from previous day: 04/07 0701 - 04/08 0700 In: 757.7 [P.O.:360; I.V.:397.7] Out: -  Intake/Output this shift: No intake/output data recorded.  Exam: Awake and alert Abdomen soft, non-tender  Lab Results:  No results for input(s): "WBC", "HGB", "HCT", "PLT" in the last 72 hours.  BMET No results for input(s): "NA", "K", "CL", "CO2", "GLUCOSE", "BUN", "CREATININE", "CALCIUM" in the last 72 hours.  PT/INR No results for input(s): "LABPROT", "INR" in the last 72 hours.  ABG No results for input(s): "PHART", "HCO3" in the last 72 hours.  Invalid input(s): "PCO2", "PO2"  Studies/Results: No results found.   Anti-infectives: Anti-infectives (From admission, onward)    None       Assessment/Plan: Large hiatal hernia -EGD with no evidence of acute obstruction -adv to FLD, discussed elective repair given no acute obstruction -patient appropriately frustrated with thi recommendation. -will d/w Dr. Carmelina Peal. Derrell Lolling regarding further plans for fixation and timing -clearance by Dr. Jacinto Halim provided to our office and found in our system yesterday, low risk.  ADDENDUM: Will plan for OR tomorrow vs Thursday as scheduling allows.  NPO p MN and I will d/w pharmacy about Arixtra and when to hold etc.  FEN - FLD, NPO p MN VTE - plavix on hold, Arixtra being given currently ID - none currently  HF with EF of 35-40% Pulmonary fibrosis Renal mass   LOS: 4 days    Letha Cape 08/31/2023

## 2023-08-31 NOTE — Progress Notes (Signed)
 PROGRESS NOTE  Chris [name].  LKG:[id number] DOB: [birth year] DOA: 08/27/2023 PCP: Donita Brooks, MD   Brief Narrative: Patient is a 83 year old male with history of hiatal hernia who presented with complaint of difficulty swallowing, epigastric discomfort, nausea, vomiting for 2 weeks.  On presentation, he was hemodynamically stable.  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  General surgery, GI consulted and following.EGD done on 4/7, no signs of obstruction.  General surgery planning for robotic/laparoscopic hiatal hernia repair  Assessment & Plan:  Principal Problem:   Hiatal hernia Active Problems:   Syncope   Renal mass   Abdominal pain, epigastric  Hiatal hernia: Previously known.  Presented with difficulty swallowing, nausea, vomiting, epigastric discomfort.  Multiple hospitalization in the past for the same.  General surgery, GI consulted. CT abdomen/pelvis did not show obstruction from his hernia. EGD in 08/30/2023, did not show any obstruction but showed large hiatal hernia containing at least one third of the stomach.   General surgery also following for possible plan for robotic/laparoscopic hiatal hernia repair. Continue Protonix  Renal cyst: No suspicious renal mass. There is a partially exophytic 4.1 x 4.1 cm simple cyst arising from the right kidney interpolar region, anteriorly. There is a 4 mm nonobstructing calculus in the right kidney lower pole calyx.Monitor as outpatient  History of syncope: Happened about 2 weeks ago.  Continue telemetry.  PT/OT will be consulted after surgical intervention.  History of HFrEF: Currently euvolemic.  Last echo showed EF of 35 to 40%, regional wall motion abnormality.  Follows with Dr. Jacinto Halim  History of pulmonary fibrosis:Follows with pulmonology        DVT prophylaxis:Place and maintain sequential compression device Start: 08/27/23  1756 SCDs Start: 08/27/23 1741     Code Status: Full Code  Family Communication: None at bedside  Patient status:Inpatient  Patient is from :home  Anticipated discharge GU:YQIH  Estimated DC date:after general surgery clearance   Consultants: None  Procedures:None  Antimicrobials:  Anti-infectives (From admission, onward)    None       Subjective: Patient seen and examined at the bedside today.  Hemodynamically stable.  Very comfortable.  Sitting in bed.  Currently on full liquid diet.  Denies any abdomen pain, nausea or vomiting  Objective: Vitals:   08/30/23 1210 08/30/23 1408 08/30/23 1937 08/31/23 0508  BP: 114/71 120/78 (!) 124/93 134/64  Pulse: (!) 50 62 (!) 52 (!) 56  Resp:  18 17 18   Temp:  98.3 F (36.8 C) 98 F (36.7 C) 97.6 F (36.4 C)  TempSrc:  Oral    SpO2: 99% 97% 94% 96%  Weight:      Height:        Intake/Output Summary (Last 24 hours) at 08/31/2023 1155 Last data filed at 08/30/2023 1752 Gross per 24 hour  Intake 757.66 ml  Output --  Net 757.66 ml   Filed Weights   08/27/23 1205  Weight: 91.2 kg    Examination:   General exam: Overall comfortable, not in distress,pleasant elderly male HEENT: PERRL Respiratory system:  no wheezes or crackles  Cardiovascular system: S1 & S2 heard, RRR.  Gastrointestinal system: Abdomen is nondistended, soft and nontender. Central nervous system: Alert and oriented Extremities: No edema, no clubbing ,no cyanosis Skin: No rashes, no ulcers,no icterus     Data Reviewed: I have personally reviewed following labs and imaging studies  CBC: Recent Labs  Lab 08/27/23 1253 08/28/23 0548  WBC 6.7 6.1  NEUTROABS 3.3  --   HGB 10.2* 9.2*  HCT 36.3* 33.7*  MCV 72.7* 74.6*  PLT 352 291   Basic Metabolic Panel: Recent Labs  Lab 08/27/23 1253 08/28/23 0548  NA 139 139  K 3.9 3.8  CL 107 107  CO2 22 23  GLUCOSE 106* 120*  BUN 15 12  CREATININE 1.27* 1.21  CALCIUM 9.2 8.8*  MG 2.5*  --       No results found for this or any previous visit (from the past 240 hours).   Radiology Studies: No results found.   Scheduled Meds:  pantoprazole (PROTONIX) IV  40 mg Intravenous Q12H   sodium chloride flush  3 mL Intravenous Q12H   Continuous Infusions:     LOS: 4 days   Burnadette Pop, MD [facility] P4/12/2023, 11:55 AM

## 2023-08-31 NOTE — Telephone Encounter (Signed)
 Left message on machine to call back

## 2023-09-01 ENCOUNTER — Encounter (HOSPITAL_COMMUNITY): Payer: Self-pay | Admitting: Internal Medicine

## 2023-09-01 DIAGNOSIS — Z8673 Personal history of transient ischemic attack (TIA), and cerebral infarction without residual deficits: Secondary | ICD-10-CM

## 2023-09-01 DIAGNOSIS — K3189 Other diseases of stomach and duodenum: Secondary | ICD-10-CM

## 2023-09-01 MED ORDER — SODIUM CHLORIDE 0.45 % IV SOLN
INTRAVENOUS | Status: AC
  Filled 2023-09-01 (×2): qty 1000

## 2023-09-01 MED ORDER — SODIUM CHLORIDE 0.45 % IV SOLN: INTRAVENOUS | Status: DC

## 2023-09-01 MED ORDER — CEFAZOLIN SODIUM-DEXTROSE 2-4 GM/100ML-% IV SOLN
2.0000 g | INTRAVENOUS | Status: AC
  Administered 2023-09-02: 2 g via INTRAVENOUS

## 2023-09-01 NOTE — Plan of Care (Signed)

## 2023-09-01 NOTE — Telephone Encounter (Signed)
 Attempted to reach pt and daughter by phone. Pt voice mail full.  Message left on daughters machine. Will await further communication from the pt or daughter

## 2023-09-01 NOTE — Progress Notes (Signed)
 PROGRESS NOTE  Chris [name].  JYN:[id number] DOB: February 15, 1941 DOA: 08/27/2023 PCP: Donita Brooks, MD   Brief Narrative: Patient is a 83 year old male with history of hiatal hernia who presented with complaint of difficulty swallowing, epigastric discomfort, nausea, vomiting for 2 weeks.  On presentation, he was hemodynamically stable.  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  General surgery, GI consulted and following.EGD done on 4/7, no signs of obstruction.  General surgery planning for robotic/laparoscopic hiatal hernia repair tomorrow  Assessment & Plan:  Principal Problem:   Organoaxial gastric volvulus Active Problems:   Incarcerated hiatal hernia   Intractable vomiting with nausea   CAD (coronary artery disease)   HFrEF (heart failure with reduced ejection fraction) (HCC)   Hypertension   Renal insufficiency   Obese   Nocturnal hypoxia   OSA (obstructive sleep apnea)   Pulmonary fibrosis (HCC)   Dysphagia   Chronic anticoagulation   Memory loss or impairment   Spouse deceased   Unintentional weight loss   Syncope   Renal mass   Abdominal pain, epigastric   History of completed stroke  Hiatal hernia: Previously known.  Presented with difficulty swallowing, nausea, vomiting, epigastric discomfort.  Multiple hospitalization in the past for the same.  General surgery, GI consulted. CT abdomen/pelvis did not show obstruction from his hernia. EGD in 08/30/2023, did not show any obstruction but showed large hiatal hernia containing at least one third of the stomach.   General surgery also following for possible plan for robotic/laparoscopic hiatal hernia repair.Plan for tomorrow Continue Protonix  Renal cyst: No suspicious renal mass. There is a partially exophytic 4.1 x 4.1 cm simple cyst arising from the right kidney interpolar region, anteriorly. There is a 4 mm nonobstructing  calculus in the right kidney lower pole calyx.Monitor as outpatient  History of syncope: Happened about 2 weeks ago.  Continue telemetry.  PT/OT will be consulted after surgical intervention.  History of HFrEF: Currently euvolemic.  Last echo showed EF of 35 to 40%, regional wall motion abnormality.  Follows with Dr. Jacinto Halim  History of pulmonary fibrosis:Follows with pulmonology        DVT prophylaxis:Place and maintain sequential compression device Start: 08/27/23 1756 SCDs Start: 08/27/23 1741     Code Status: Full Code  Family Communication: None at bedside  Patient status:Inpatient  Patient is from :home  Anticipated discharge QM:VHQI  Estimated DC date:after general surgery clearance   Consultants: None  Procedures:None  Antimicrobials:  Anti-infectives (From admission, onward)    Start     Dose/Rate Route Frequency Ordered Stop   09/02/23 1100  ceFAZolin (ANCEF) IVPB 2g/100 mL premix        2 g 200 mL/hr over 30 Minutes Intravenous On call to O.R. 09/01/23 6962 09/03/23 0559       Subjective: Patient seen and examined at bedside today.  Hemodynamically stable.  Overall comfortable.  Lying in bed.  Denies any abdominal pain, nausea or vomiting.  Tolerating full liquid diet.  Objective: Vitals:   08/31/23 1933 08/31/23 1936 09/01/23 0530 09/01/23 1016  BP: (!) 182/100 (!) 152/84 116/75   Pulse: 80 64 76   Resp: 20 20 15    Temp: 98.3 F (36.8 C) 98.3 F (36.8 C) 98.1 F (36.7 C)   TempSrc:      SpO2: 94% 100% 98% 90%  Weight:      Height:        Intake/Output Summary (Last  24 hours) at 09/01/2023 1143 Last data filed at 08/31/2023 1813 Gross per 24 hour  Intake 240 ml  Output --  Net 240 ml   Filed Weights   08/27/23 1205  Weight: 91.2 kg    Examination: General exam: Overall comfortable, not in distress, pleasant elderly male HEENT: PERRL Respiratory system :crackles on bilateral bases Cardiovascular system: S1 & S2 heard, RRR.   Gastrointestinal system: Abdomen is nondistended, soft and nontender. Central nervous system: Alert and oriented Extremities: No edema, no clubbing ,no cyanosis Skin: No rashes, no ulcers,no icterus      Data Reviewed: I have personally reviewed following labs and imaging studies  CBC: Recent Labs  Lab 08/27/23 1253 08/28/23 0548  WBC 6.7 6.1  NEUTROABS 3.3  --   HGB 10.2* 9.2*  HCT 36.3* 33.7*  MCV 72.7* 74.6*  PLT 352 291   Basic Metabolic Panel: Recent Labs  Lab 08/27/23 1253 08/28/23 0548  NA 139 139  K 3.9 3.8  CL 107 107  CO2 22 23  GLUCOSE 106* 120*  BUN 15 12  CREATININE 1.27* 1.21  CALCIUM 9.2 8.8*  MG 2.5*  --      No results found for this or any previous visit (from the past 240 hours).   Radiology Studies: No results found.   Scheduled Meds:  pantoprazole (PROTONIX) IV  40 mg Intravenous Q12H   sodium chloride flush  3 mL Intravenous Q12H   Continuous Infusions:  [START ON 09/02/2023]  ceFAZolin (ANCEF) IV     sodium chloride 0.45 % 1,000 mL with potassium chloride 10 mEq infusion 50 mL/hr at 09/01/23 0840      LOS: 5 days   Burnadette Pop, MD [facility] P4/01/2024, 11:43 AM

## 2023-09-01 NOTE — Progress Notes (Signed)
 Mobility Specialist - Progress Note   09/01/23 1016  Oxygen Therapy  SpO2 90 %  O2 Device Room Air  Patient Activity (if Appropriate) Ambulating  Mobility  Activity Ambulated with assistance in hallway  Level of Assistance Modified independent, requires aide device or extra time  Assistive Device Other (Comment) (IV Pole)  Distance Ambulated (ft) 275 ft  Activity Response Tolerated well  Mobility Referral Yes  Mobility visit 1 Mobility  Mobility Specialist Start Time (ACUTE ONLY) 1003  Mobility Specialist Stop Time (ACUTE ONLY) 1016  Mobility Specialist Time Calculation (min) (ACUTE ONLY) 13 min   Pt received in bed and agreeable to mobility. Pt c/o feeling SOB. SpO2 found to be 90%. No other complaints during session. Pt to bed after session with all needs met.  [facility]

## 2023-09-01 NOTE — Progress Notes (Signed)
 2 Days Post-Op   Subjective/Chief Complaint: Some pain with soup overnight.  No pain this morning.  No new complaints otherwise  Objective: Vital signs in last 24 hours: Temp:  [97.6 F (36.4 C)-98.3 F (36.8 C)] 98.1 F (36.7 C) (04/09 0530) Pulse Rate:  [64-80] 76 (04/09 0530) Resp:  [15-20] 15 (04/09 0530) BP: (114-182)/(68-100) 116/75 (04/09 0530) SpO2:  [94 %-100 %] 98 % (04/09 0530) Last BM Date : 08/28/23  Intake/Output from previous day: 04/08 0701 - 04/09 0700 In: 640 [P.O.:640] Out: -  Intake/Output this shift: No intake/output data recorded.  Exam: Awake and alert Abdomen soft, non-tender  Lab Results:  No results for input(s): "WBC", "HGB", "HCT", "PLT" in the last 72 hours.  BMET No results for input(s): "NA", "K", "CL", "CO2", "GLUCOSE", "BUN", "CREATININE", "CALCIUM" in the last 72 hours.  PT/INR No results for input(s): "LABPROT", "INR" in the last 72 hours.  ABG No results for input(s): "PHART", "HCO3" in the last 72 hours.  Invalid input(s): "PCO2", "PO2"  Studies/Results: No results found.   Anti-infectives: Anti-infectives (From admission, onward)    Start     Dose/Rate Route Frequency Ordered Stop   09/02/23 0600  ceFAZolin (ANCEF) IVPB 2g/100 mL premix        2 g 200 mL/hr over 30 Minutes Intravenous On call to O.R. 09/01/23 0758 09/03/23 0559       Assessment/Plan: Large hiatal hernia -EGD with no evidence of acute obstruction -FLD today, NPO p MN, surgical repair 4/10 at noon -procedure discussed yesterday by Dr. Derrell Lolling and the patient understands and is agreeable to proceed tomorrow -clearance by Dr. Jacinto Halim provided to our office and found in our system yesterday, low risk.  FEN - FLD, NPO p MN VTE - plavix on hold (hasn't taken in 2-3 weeks), Arixtra has been held as well ID - none currently  HF with EF of 35-40% Pulmonary fibrosis Renal mass   LOS: 5 days    Letha Cape 09/01/2023

## 2023-09-02 ENCOUNTER — Encounter (HOSPITAL_COMMUNITY)
Admission: EM | Disposition: A | Discharge status: Home / Self Care | Payer: Self-pay | Source: Home / Self Care | Attending: Internal Medicine

## 2023-09-02 ENCOUNTER — Encounter (HOSPITAL_COMMUNITY): Payer: Self-pay | Admitting: Internal Medicine

## 2023-09-02 ENCOUNTER — Inpatient Hospital Stay (HOSPITAL_COMMUNITY): Admitting: Anesthesiology

## 2023-09-02 ENCOUNTER — Other Ambulatory Visit: Payer: Self-pay

## 2023-09-02 DIAGNOSIS — I509 Heart failure, unspecified: Secondary | ICD-10-CM

## 2023-09-02 DIAGNOSIS — I110 Hypertensive heart disease with heart failure: Secondary | ICD-10-CM | POA: Diagnosis not present

## 2023-09-02 DIAGNOSIS — K449 Diaphragmatic hernia without obstruction or gangrene: Secondary | ICD-10-CM

## 2023-09-02 DIAGNOSIS — I2510 Atherosclerotic heart disease of native coronary artery without angina pectoris: Secondary | ICD-10-CM | POA: Diagnosis not present

## 2023-09-02 DIAGNOSIS — K3189 Other diseases of stomach and duodenum: Secondary | ICD-10-CM | POA: Diagnosis not present

## 2023-09-02 HISTORY — PX: HIATAL HERNIA REPAIR: SHX195

## 2023-09-02 LAB — BASIC METABOLIC PANEL WITH GFR
Anion gap: 9 (ref 5–15)
BUN: 10 mg/dL (ref 8–23)
CO2: 22 mmol/L (ref 22–32)
Calcium: 8.9 mg/dL (ref 8.9–10.3)
Chloride: 106 mmol/L (ref 98–111)
Creatinine, Ser: 1.29 mg/dL — ABNORMAL HIGH (ref 0.61–1.24)
GFR, Estimated: 56 mL/min — ABNORMAL LOW (ref >60)
Glucose, Bld: 98 mg/dL (ref 70–99)
Potassium: 3.8 mmol/L (ref 3.5–5.1)
Sodium: 137 mmol/L (ref 135–145)

## 2023-09-02 LAB — GLUCOSE, CAPILLARY: Glucose-Capillary: 110 mg/dL — ABNORMAL HIGH (ref 70–99)

## 2023-09-02 LAB — CBC
HCT: 35.1 % — ABNORMAL LOW (ref 39.0–52.0)
Hemoglobin: 10.1 g/dL — ABNORMAL LOW (ref 13.0–17.0)
MCH: 20.8 pg — ABNORMAL LOW (ref 26.0–34.0)
MCHC: 28.8 g/dL — ABNORMAL LOW (ref 30.0–36.0)
MCV: 72.4 fL — ABNORMAL LOW (ref 80.0–100.0)
Platelets: 317 10*3/uL (ref 150–400)
RBC: 4.85 MIL/uL (ref 4.22–5.81)
RDW: 19.5 % — ABNORMAL HIGH (ref 11.5–15.5)
WBC: 5.4 10*3/uL (ref 4.0–10.5)
nRBC: 0 % (ref 0.0–0.2)

## 2023-09-02 SURGERY — REPAIR, HERNIA, HIATAL, LAPAROSCOPIC
Anesthesia: General

## 2023-09-02 MED ORDER — FENTANYL CITRATE (PF) 100 MCG/2ML IJ SOLN
INTRAMUSCULAR | Status: DC | PRN
  Administered 2023-09-02: 50 ug via INTRAVENOUS
  Administered 2023-09-02 (×2): 25 ug via INTRAVENOUS

## 2023-09-02 MED ORDER — DEXAMETHASONE SODIUM PHOSPHATE 10 MG/ML IJ SOLN
INTRAMUSCULAR | Status: DC | PRN
  Administered 2023-09-02: 4 mg via INTRAVENOUS

## 2023-09-02 MED ORDER — MORPHINE SULFATE (PF) 2 MG/ML IV SOLN: 1.0000 mg | INTRAVENOUS | Status: DC | PRN

## 2023-09-02 MED ORDER — METHOCARBAMOL 1000 MG/10ML IJ SOLN: 500.0000 mg | Freq: Three times a day (TID) | INTRAMUSCULAR | Status: DC | PRN

## 2023-09-02 MED ORDER — PHENYLEPHRINE HCL-NACL 20-0.9 MG/250ML-% IV SOLN
INTRAVENOUS | Status: DC | PRN
Start: 2023-09-02 — End: 2023-09-02
  Administered 2023-09-02: 15 ug/min via INTRAVENOUS
  Administered 2023-09-02: 30 ug/min via INTRAVENOUS

## 2023-09-02 MED ORDER — LACTATED RINGERS IV SOLN: INTRAVENOUS | Status: DC

## 2023-09-02 MED ORDER — ROCURONIUM BROMIDE 10 MG/ML (PF) SYRINGE
PREFILLED_SYRINGE | INTRAVENOUS | Status: AC
  Filled 2023-09-02: qty 10

## 2023-09-02 MED ORDER — CHLORHEXIDINE GLUCONATE 0.12 % MT SOLN
15.0000 mL | Freq: Once | OROMUCOSAL | Status: AC
  Administered 2023-09-02: 15 mL via OROMUCOSAL

## 2023-09-02 MED ORDER — PHENYLEPHRINE 80 MCG/ML (10ML) SYRINGE FOR IV PUSH (FOR BLOOD PRESSURE SUPPORT)
PREFILLED_SYRINGE | INTRAVENOUS | Status: AC
  Filled 2023-09-02: qty 10

## 2023-09-02 MED ORDER — ALBUMIN HUMAN 5 % IV SOLN: INTRAVENOUS | Status: DC | PRN

## 2023-09-02 MED ORDER — EPHEDRINE SULFATE-NACL 50-0.9 MG/10ML-% IV SOSY
PREFILLED_SYRINGE | INTRAVENOUS | Status: DC | PRN
  Administered 2023-09-02: 10 mg via INTRAVENOUS

## 2023-09-02 MED ORDER — ONDANSETRON HCL 4 MG/2ML IJ SOLN
INTRAMUSCULAR | Status: AC
  Filled 2023-09-02: qty 2

## 2023-09-02 MED ORDER — ACETAMINOPHEN 160 MG/5ML PO SOLN
ORAL | Status: AC
Start: 2023-09-02 — End: 2023-09-02
  Filled 2023-09-02: qty 20.3

## 2023-09-02 MED ORDER — INSULIN ASPART 100 UNIT/ML IJ SOLN: 0.0000 [IU] | INTRAMUSCULAR | Status: DC | PRN

## 2023-09-02 MED ORDER — GLYCOPYRROLATE 0.2 MG/ML IJ SOLN
INTRAMUSCULAR | Status: DC | PRN
  Administered 2023-09-02: .1 mg via INTRAVENOUS

## 2023-09-02 MED ORDER — LIDOCAINE HCL (PF) 2 % IJ SOLN
INTRAMUSCULAR | Status: AC
  Filled 2023-09-02: qty 5

## 2023-09-02 MED ORDER — FENTANYL CITRATE PF 50 MCG/ML IJ SOSY: 25.0000 ug | PREFILLED_SYRINGE | INTRAMUSCULAR | Status: DC | PRN

## 2023-09-02 MED ORDER — BUPIVACAINE-EPINEPHRINE (PF) 0.25% -1:200000 IJ SOLN
INTRAMUSCULAR | Status: AC
  Filled 2023-09-02: qty 30

## 2023-09-02 MED ORDER — PROPOFOL 10 MG/ML IV BOLUS
INTRAVENOUS | Status: DC | PRN
  Administered 2023-09-02: 150 mg via INTRAVENOUS

## 2023-09-02 MED ORDER — OXYCODONE HCL 5 MG PO TABS
5.0000 mg | ORAL_TABLET | Freq: Four times a day (QID) | ORAL | Status: DC | PRN
  Administered 2023-09-02 – 2023-09-03 (×2): 5 mg via ORAL
  Filled 2023-09-02 (×2): qty 1

## 2023-09-02 MED ORDER — HYDROMORPHONE HCL 2 MG/ML IJ SOLN
INTRAMUSCULAR | Status: AC
  Filled 2023-09-02: qty 1

## 2023-09-02 MED ORDER — LIDOCAINE HCL (CARDIAC) PF 100 MG/5ML IV SOSY
PREFILLED_SYRINGE | INTRAVENOUS | Status: DC | PRN
  Administered 2023-09-02: 60 mg via INTRAVENOUS

## 2023-09-02 MED ORDER — SUGAMMADEX SODIUM 200 MG/2ML IV SOLN
INTRAVENOUS | Status: DC | PRN
  Administered 2023-09-02: 350 mg via INTRAVENOUS

## 2023-09-02 MED ORDER — LACTATED RINGERS IV SOLN: INTRAVENOUS | Status: DC | PRN

## 2023-09-02 MED ORDER — 0.9 % SODIUM CHLORIDE (POUR BTL) OPTIME
TOPICAL | Status: DC | PRN
Start: 2023-09-02 — End: 2023-09-02
  Administered 2023-09-02: 1000 mL

## 2023-09-02 MED ORDER — ACETAMINOPHEN 160 MG/5ML PO SOLN
1000.0000 mg | Freq: Four times a day (QID) | ORAL | Status: DC
  Administered 2023-09-02 – 2023-09-03 (×4): 1000 mg via ORAL
  Filled 2023-09-02 (×3): qty 40.6

## 2023-09-02 MED ORDER — BUPIVACAINE-EPINEPHRINE 0.25% -1:200000 IJ SOLN
INTRAMUSCULAR | Status: DC | PRN
  Administered 2023-09-02: 30 mL

## 2023-09-02 MED ORDER — AMISULPRIDE (ANTIEMETIC) 5 MG/2ML IV SOLN: 10.0000 mg | Freq: Once | INTRAVENOUS | Status: DC | PRN

## 2023-09-02 MED ORDER — DEXAMETHASONE SODIUM PHOSPHATE 10 MG/ML IJ SOLN
INTRAMUSCULAR | Status: AC
  Filled 2023-09-02: qty 1

## 2023-09-02 MED ORDER — ALBUMIN HUMAN 5 % IV SOLN
INTRAVENOUS | Status: AC
  Filled 2023-09-02: qty 250

## 2023-09-02 MED ORDER — PHENYLEPHRINE 80 MCG/ML (10ML) SYRINGE FOR IV PUSH (FOR BLOOD PRESSURE SUPPORT)
PREFILLED_SYRINGE | INTRAVENOUS | Status: DC | PRN
  Administered 2023-09-02: 80 ug via INTRAVENOUS
  Administered 2023-09-02: 120 ug via INTRAVENOUS
  Administered 2023-09-02 (×2): 80 ug via INTRAVENOUS

## 2023-09-02 MED ORDER — ONDANSETRON HCL 4 MG/2ML IJ SOLN
INTRAMUSCULAR | Status: DC | PRN
  Administered 2023-09-02: 4 mg via INTRAVENOUS

## 2023-09-02 MED ORDER — FENTANYL CITRATE (PF) 100 MCG/2ML IJ SOLN
INTRAMUSCULAR | Status: AC
  Filled 2023-09-02: qty 2

## 2023-09-02 MED ORDER — ACETAMINOPHEN 160 MG/5ML PO SOLN
ORAL | Status: AC
  Filled 2023-09-02: qty 20.3

## 2023-09-02 MED ORDER — ROCURONIUM BROMIDE 100 MG/10ML IV SOLN
INTRAVENOUS | Status: DC | PRN
  Administered 2023-09-02 (×2): 10 mg via INTRAVENOUS
  Administered 2023-09-02: 20 mg via INTRAVENOUS

## 2023-09-02 MED ORDER — HYDROMORPHONE HCL 1 MG/ML IJ SOLN
INTRAMUSCULAR | Status: DC | PRN
  Administered 2023-09-02: .2 mg via INTRAVENOUS
  Administered 2023-09-02: .4 mg via INTRAVENOUS
  Administered 2023-09-02 (×2): .3 mg via INTRAVENOUS

## 2023-09-02 SURGICAL SUPPLY — 48 items
APPLIER CLIP 5 13 M/L LIGAMAX5 (MISCELLANEOUS) IMPLANT
APPLIER CLIP ROT 10 11.4 M/L (STAPLE) IMPLANT
BAG COUNTER SPONGE SURGICOUNT (BAG) IMPLANT
CABLE HIGH FREQUENCY MONO STRZ (ELECTRODE) ×1 IMPLANT
CANNULA REDUCER 12-8 DVNC XI (CANNULA) IMPLANT
CHLORAPREP W/TINT 26 (MISCELLANEOUS) ×1 IMPLANT
CLIP APPLIE 5 13 M/L LIGAMAX5 (MISCELLANEOUS) IMPLANT
CLIP APPLIE ROT 10 11.4 M/L (STAPLE) IMPLANT
DEFOGGER SCOPE WARMER CLEARIFY (MISCELLANEOUS) IMPLANT
DERMABOND ADVANCED .7 DNX12 (GAUZE/BANDAGES/DRESSINGS) ×1 IMPLANT
DRAIN PENROSE 0.5X18 (DRAIN) ×1 IMPLANT
DRAPE UTILITY XL STRL (DRAPES) ×1 IMPLANT
ELECT REM PT RETURN 15FT ADLT (MISCELLANEOUS) ×1 IMPLANT
GLOVE BIO SURGEON STRL SZ7.5 (GLOVE) ×1 IMPLANT
GOWN STRL REUS W/ TWL XL LVL3 (GOWN DISPOSABLE) ×3 IMPLANT
GRASPER SUT TROCAR 14GX15 (MISCELLANEOUS) IMPLANT
IRRIG SUCT STRYKERFLOW 2 WTIP (MISCELLANEOUS) ×1 IMPLANT
IRRIGATION SUCT STRKRFLW 2 WTP (MISCELLANEOUS) ×1 IMPLANT
KIT BASIN OR (CUSTOM PROCEDURE TRAY) ×1 IMPLANT
KIT TURNOVER KIT A (KITS) IMPLANT
LEGGING LITHOTOMY PAIR STRL (DRAPES) ×1 IMPLANT
MARKER SKIN DUAL TIP RULER LAB (MISCELLANEOUS) ×1 IMPLANT
MESH BIO-A 7X10 SYN MAT (Mesh General) IMPLANT
NDL INSUFFLATION 14GA 120MM (NEEDLE) ×1 IMPLANT
NEEDLE INSUFFLATION 14GA 120MM (NEEDLE) ×1 IMPLANT
PAD POSITIONING PINK XL (MISCELLANEOUS) IMPLANT
PENCIL SMOKE EVACUATOR (MISCELLANEOUS) IMPLANT
PROTECTOR NERVE ULNAR (MISCELLANEOUS) IMPLANT
RELOAD STAPLE 4.0 BLU F/HERNIA (INSTRUMENTS) IMPLANT
RELOAD STAPLE 4.8 BLK F/HERNIA (STAPLE) IMPLANT
RELOAD STAPLE HERNIA 4.0 BLUE (INSTRUMENTS) IMPLANT
RELOAD STAPLE HERNIA 4.8 BLK (STAPLE) IMPLANT
SCISSORS LAP 5X35 DISP (ENDOMECHANICALS) ×1 IMPLANT
SEALER VESSEL EXT DVNC XI (MISCELLANEOUS) IMPLANT
SET TUBE SMOKE EVAC HIGH FLOW (TUBING) ×1 IMPLANT
SHEARS HARMONIC 36 ACE (MISCELLANEOUS) IMPLANT
SLEEVE Z-THREAD 5X100MM (TROCAR) ×4 IMPLANT
SPIKE FLUID TRANSFER (MISCELLANEOUS) ×1 IMPLANT
STAPLER HERNIA 12 8.5 360D (INSTRUMENTS) ×1 IMPLANT
SUT ETHIBOND 0 36 GRN (SUTURE) ×3 IMPLANT
SUT MNCRL AB 4-0 PS2 18 (SUTURE) ×1 IMPLANT
SUT SILK 0 SH 30 (SUTURE) IMPLANT
TAPE CLOTH 4X10 WHT NS (GAUZE/BANDAGES/DRESSINGS) IMPLANT
TOWEL OR 17X26 10 PK STRL BLUE (TOWEL DISPOSABLE) ×1 IMPLANT
TRAY FOLEY MTR SLVR 16FR STAT (SET/KITS/TRAYS/PACK) ×1 IMPLANT
TRAY LAPAROSCOPIC (CUSTOM PROCEDURE TRAY) ×1 IMPLANT
TROCAR ADV FIXATION 12X100MM (TROCAR) IMPLANT
TROCAR Z-THREAD OPTICAL 5X100M (TROCAR) ×1 IMPLANT

## 2023-09-02 NOTE — Anesthesia Preprocedure Evaluation (Signed)
 Anesthesia Evaluation  Patient identified by MRN, date of birth, ID band Patient awake    Reviewed: Allergy & Precautions, NPO status , Patient's Chart, lab work & pertinent test results  Airway Mallampati: II  TM Distance: >3 FB Neck ROM: Full    Dental  (+) Dental Advisory Given, Poor Dentition, Missing   Pulmonary sleep apnea , former smoker   Pulmonary exam normal breath sounds clear to auscultation       Cardiovascular hypertension, + CAD, + Past MI, + CABG, +CHF and + DOE  Normal cardiovascular exam+ dysrhythmias (RBBB)  Rhythm:Regular Rate:Normal     Neuro/Psych TIA Neuromuscular disease CVA    GI/Hepatic Neg liver ROS, hiatal hernia,GERD  Medicated,,  Endo/Other  diabetes, Type 2, Oral Hypoglycemic Agents    Renal/GU Renal InsufficiencyRenal disease     Musculoskeletal  (+) Arthritis ,    Abdominal   Peds  Hematology  (+) Blood dyscrasia (Plavix)   Anesthesia Other Findings Day of surgery medications reviewed with the patient.  Reproductive/Obstetrics                             Anesthesia Physical Anesthesia Plan  ASA: 3  Anesthesia Plan: General   Post-op Pain Management: Ofirmev IV (intra-op)*   Induction: Intravenous and Rapid sequence  PONV Risk Score and Plan: 1 and TIVA and Treatment may vary due to age or medical condition  Airway Management Planned: Oral ETT  Additional Equipment:   Intra-op Plan:   Post-operative Plan: Extubation in OR  Informed Consent: I have reviewed the patients History and Physical, chart, labs and discussed the procedure including the risks, benefits and alternatives for the proposed anesthesia with the patient or authorized representative who has indicated his/her understanding and acceptance.     Dental advisory given  Plan Discussed with: CRNA  Anesthesia Plan Comments:        Anesthesia Quick Evaluation

## 2023-09-02 NOTE — Progress Notes (Signed)
 3 Days Post-Op   Subjective/Chief Complaint: Doing well    Objective: Vital signs in last 24 hours: Temp:  [97.4 F (36.3 C)-97.9 F (36.6 C)] 97.9 F (36.6 C) (04/09 2031) Pulse Rate:  [71-85] 85 (04/09 2031) Resp:  [18] 18 (04/09 2031) BP: (136-138)/(75-98) 138/98 (04/09 2031) SpO2:  [90 %-95 %] 95 % (04/09 2031) Last BM Date : 08/28/23  Intake/Output from previous day: 04/09 0701 - 04/10 0700 In: 1395.1 [P.O.:480; I.V.:915.1] Out: -  Intake/Output this shift: No intake/output data recorded.  PE:  Constitutional: No acute distress, conversant, appears states age. Eyes: Anicteric sclerae, moist conjunctiva, no lid lag Lungs: Clear to auscultation bilaterally, normal respiratory effort CV: regular rate and rhythm, no murmurs, no peripheral edema, pedal pulses 2+ GI: Soft, no masses or hepatosplenomegaly, non-tender to palpation Skin: No rashes, palpation reveals normal turgor Psychiatric: appropriate judgment and insight, oriented to person, place, and time   Lab Results:  Recent Labs    09/02/23 0511  WBC 5.4  HGB 10.1*  HCT 35.1*  PLT 317   BMET Recent Labs    09/02/23 0511  NA 137  K 3.8  CL 106  CO2 22  GLUCOSE 98  BUN 10  CREATININE 1.29*  CALCIUM 8.9   PT/INR No results for input(s): "LABPROT", "INR" in the last 72 hours. ABG No results for input(s): "PHART", "HCO3" in the last 72 hours.  Invalid input(s): "PCO2", "PO2"  Studies/Results: No results found.  Anti-infectives: Anti-infectives (From admission, onward)    Start     Dose/Rate Route Frequency Ordered Stop   09/02/23 1100  ceFAZolin (ANCEF) IVPB 2g/100 mL premix        2 g 200 mL/hr over 30 Minutes Intravenous On call to O.R. 09/01/23 0758 09/03/23 0559       Assessment/Plan: Large hiatal hernia -OR today for robo HHR withmesh and fundo   FEN - NPO  VTE - plavix on hold (hasn't taken in 2-3 weeks), Arixtra has been held as well ID - none currently   HF with EF of  35-40% Pulmonary fibrosis Renal mass    LOS: 6 days    Axel Filler 09/02/2023

## 2023-09-02 NOTE — Plan of Care (Signed)
  Problem: Education: Goal: Knowledge of General Education information will improve Description: Including pain rating scale, medication(s)/side effects and non-pharmacologic comfort measures Outcome: Progressing   Problem: Health Behavior/Discharge Planning: Goal: Ability to manage health-related needs will improve Outcome: Progressing   Problem: Clinical Measurements: Goal: Diagnostic test results will improve Outcome: Progressing Goal: Respiratory complications will improve Outcome: Progressing   Problem: Nutrition: Goal: Adequate nutrition will be maintained Outcome: Progressing   Problem: Elimination: Goal: Will not experience complications related to urinary retention Outcome: Progressing   Problem: Safety: Goal: Ability to remain free from injury will improve Outcome: Progressing   Problem: Skin Integrity: Goal: Risk for impaired skin integrity will decrease Outcome: Progressing

## 2023-09-02 NOTE — Anesthesia Procedure Notes (Signed)
 Procedure Name: Intubation Date/Time: 09/02/2023 1:08 PM  Performed by: Floydene Flock, CRNAPre-anesthesia Checklist: Patient identified, Emergency Drugs available, Patient being monitored and Suction available Patient Re-evaluated:Patient Re-evaluated prior to induction Oxygen Delivery Method: Circle system utilized Preoxygenation: Pre-oxygenation with 100% oxygen Induction Type: IV induction Laryngoscope Size: Mac and 4 Grade View: Grade I Tube type: Oral Tube size: 7.5 mm Number of attempts: 1 Airway Equipment and Method: Stylet Placement Confirmation: ETT inserted through vocal cords under direct vision, positive ETCO2 and breath sounds checked- equal and bilateral Secured at: 23 cm Tube secured with: Tape Dental Injury: Teeth and Oropharynx as per pre-operative assessment

## 2023-09-02 NOTE — Transfer of Care (Signed)
 Immediate Anesthesia Transfer of Care Note  Patient: Chris [name].  Procedure(s) Performed: REPAIR, HERNIA, HIATAL, LAPAROSCOPIC  Patient Location: PACU  Anesthesia Type:General  Level of Consciousness: awake and drowsy  Airway & Oxygen Therapy: Patient Spontanous Breathing and Patient connected to face mask oxygen  Post-op Assessment: report to RN. Patient stable.  Post vital signs: Reviewed and stable  Last Vitals:  Vitals Value Taken Time  BP 141/68 09/02/23 1430  Temp 36.4 C 09/02/23 1427  Pulse 74 09/02/23 1437  Resp 14 09/02/23 1437  SpO2 90 % 09/02/23 1437  Vitals shown include unfiled device data.  Last Pain:  Vitals:   09/02/23 1427  TempSrc:   PainSc: Asleep      Patients Stated Pain Goal: 0 (08/31/23 2009)  Complications: No notable events documented.

## 2023-09-02 NOTE — Progress Notes (Signed)
 PROGRESS NOTE  Chris [name].  CVE:[id number] DOB: 29-Jul-1940 DOA: 08/27/2023 PCP: Donita Brooks, MD   Brief Narrative: Patient is a 83 year old male with history of hiatal hernia who presented with complaint of difficulty swallowing, epigastric discomfort, nausea, vomiting for 2 weeks.  On presentation, he was hemodynamically stable.  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  General surgery, GI consulted and following.EGD done on 4/7, no signs of obstruction.  General surgery planning for robotic hiatal hernia repair with mesh and fundoplication today by general surgery  Assessment & Plan:  Principal Problem:   Organoaxial gastric volvulus Active Problems:   Incarcerated hiatal hernia   Intractable vomiting with nausea   CAD (coronary artery disease)   HFrEF (heart failure with reduced ejection fraction) (HCC)   Hypertension   Renal insufficiency   Obese   Nocturnal hypoxia   OSA (obstructive sleep apnea)   Pulmonary fibrosis (HCC)   Dysphagia   Chronic anticoagulation   Memory loss or impairment   Spouse deceased   Unintentional weight loss   Syncope   Renal mass   Abdominal pain, epigastric   History of completed stroke  Hiatal hernia: Previously known.  Presented with difficulty swallowing, nausea, vomiting, epigastric discomfort.  Multiple hospitalization in the past for the same.  General surgery, GI consulted. CT abdomen/pelvis did not show obstruction from his hernia. EGD in 08/30/2023, did not show any obstruction but showed large hiatal hernia containing at least one third of the stomach.  Continue Protonix General surgery planning for robotic hiatal hernia repair with mesh and fundoplication today by general surgery  Renal cyst: No suspicious renal mass. There is a partially exophytic 4.1 x 4.1 cm simple cyst arising from the right kidney interpolar region, anteriorly. There  is a 4 mm nonobstructing calculus in the right kidney lower pole calyx.Monitor as outpatient  History of syncope: Happened about 2 weeks ago.  Continue telemetry.  PT/OT will be consulted after surgical intervention.  History of HFrEF: Currently euvolemic.  Last echo showed EF of 35 to 40%, regional wall motion abnormality.  Follows with Dr. Jacinto Halim  History of pulmonary fibrosis:Follows with pulmonology        DVT prophylaxis:Place and maintain sequential compression device Start: 08/27/23 1756 SCDs Start: 08/27/23 1741     Code Status: Full Code  Family Communication: None at bedside  Patient status:Inpatient  Patient is from :home  Anticipated discharge WC:HENI  Estimated DC date:after general surgery clearance   Consultants: GI, general surgery  Procedures:None  Antimicrobials:  Anti-infectives (From admission, onward)    Start     Dose/Rate Route Frequency Ordered Stop   09/02/23 1100  [MAR Hold]  ceFAZolin (ANCEF) IVPB 2g/100 mL premix        (MAR Hold since Thu 09/02/2023 at 1014.Hold Reason: Transfer to a Procedural area)   2 g 200 mL/hr over 30 Minutes Intravenous On call to O.R. 09/01/23 0758 09/02/23 1228       Subjective: Patient seen and examined at bedside today.  Lying comfortably on bed.  No new complaints.  No abdomen pain, nausea or vomiting   objective: Vitals:   09/01/23 1156 09/01/23 2031 09/02/23 1021 09/02/23 1039  BP: 136/75 (!) 138/98 103/63   Pulse: 71 85 68   Resp:  18 18   Temp: (!) 97.4 F (36.3 C) 97.9 F (36.6 C) 98.5 F (36.9 C)   TempSrc: Axillary  Oral   SpO2:  95% 94%   Weight:    91.2 kg  Height:    6' (1.829 m)    Intake/Output Summary (Last 24 hours) at 09/02/2023 1352 Last data filed at 09/02/2023 1158 Gross per 24 hour  Intake 1495.09 ml  Output --  Net 1495.09 ml   Filed Weights   08/27/23 1205 09/02/23 1039  Weight: 91.2 kg 91.2 kg    Examination:  General exam: Overall comfortable, not in  distress HEENT: PERRL Respiratory system: crackles on bilateral bases Cardiovascular system: S1 & S2 heard, RRR.  Gastrointestinal system: Abdomen is nondistended, soft and nontender. Central nervous system: Alert and oriented Extremities: No edema, no clubbing ,no cyanosis Skin: No rashes, no ulcers,no icterus     Data Reviewed: I have personally reviewed following labs and imaging studies  CBC: Recent Labs  Lab 08/27/23 1253 08/28/23 0548 09/02/23 0511  WBC 6.7 6.1 5.4  NEUTROABS 3.3  --   --   HGB 10.2* 9.2* 10.1*  HCT 36.3* 33.7* 35.1*  MCV 72.7* 74.6* 72.4*  PLT 352 291 317   Basic Metabolic Panel: Recent Labs  Lab 08/27/23 1253 08/28/23 0548 09/02/23 0511  NA 139 139 137  K 3.9 3.8 3.8  CL 107 107 106  CO2 22 23 22   GLUCOSE 106* 120* 98  BUN 15 12 10   CREATININE 1.27* 1.21 1.29*  CALCIUM 9.2 8.8* 8.9  MG 2.5*  --   --      No results found for this or any previous visit (from the past 240 hours).   Radiology Studies: No results found.   Scheduled Meds:  [MAR Hold] pantoprazole (PROTONIX) IV  40 mg Intravenous Q12H   [MAR Hold] sodium chloride flush  3 mL Intravenous Q12H   Continuous Infusions:  lactated ringers 10 mL/hr at 09/02/23 1204      LOS: 6 days   Burnadette Pop, MD [facility] P4/02/2024, 1:52 PM

## 2023-09-02 NOTE — Anesthesia Postprocedure Evaluation (Signed)
 Anesthesia Post Note  Patient: Chris [name].  Procedure(s) Performed: REPAIR, HERNIA, HIATAL, LAPAROSCOPIC     Patient location during evaluation: PACU Anesthesia Type: General Level of consciousness: awake and alert Pain management: pain level controlled Vital Signs Assessment: post-procedure vital signs reviewed and stable Respiratory status: spontaneous breathing, nonlabored ventilation, respiratory function stable and patient connected to nasal cannula oxygen Cardiovascular status: blood pressure returned to baseline and stable Postop Assessment: no apparent nausea or vomiting Anesthetic complications: no  No notable events documented.  Last Vitals:  Vitals:   09/02/23 1530 09/02/23 1545  BP:    Pulse: 61 62  Resp: 11 12  Temp:    SpO2: 100% 100%    Last Pain:  Vitals:   09/02/23 1530  TempSrc:   PainSc: [name]

## 2023-09-02 NOTE — Plan of Care (Signed)
  Problem: Clinical Measurements: Goal: Will remain free from infection Outcome: Progressing Goal: Diagnostic test results will improve Outcome: Progressing   Problem: Nutrition: Goal: Adequate nutrition will be maintained Outcome: Progressing   Problem: Coping: Goal: Level of anxiety will decrease Outcome: Progressing   Problem: Elimination: Goal: Will not experience complications related to bowel motility Outcome: Progressing Goal: Will not experience complications related to urinary retention Outcome: Progressing   Problem: Pain Managment: Goal: General experience of comfort will improve and/or be controlled Outcome: Progressing   Problem: Safety: Goal: Ability to remain free from injury will improve Outcome: Progressing

## 2023-09-02 NOTE — Op Note (Signed)
 09/02/2023  2:10 PM  PATIENT:  Chris [name].  83 y.o. male  PRE-OPERATIVE DIAGNOSIS:  HIATAL HERNIA  POST-OPERATIVE DIAGNOSIS:  HIATAL HERNIA  PROCEDURE:  Procedure(s) with comments: REPAIR, HERNIA, HIATAL, - ROBOTIC HIATAL HERNIA REPAIR WITH MESH AND TOUPET FUNDOPLICATION  SURGEON:  Surgeons and Role:    Axel Filler, MD - Primary  ASSISTANTS: none   ANESTHESIA:   local and general  EBL:  minimal   BLOOD ADMINISTERED:none  DRAINS: none   LOCAL MEDICATIONS USED:  BUPIVICAINE   SPECIMEN:  No Specimen  DISPOSITION OF SPECIMEN:  N/A  COUNTS:  YES  TOURNIQUET:  * No tourniquets in log *  DICTATION: .Dragon Dictation  Findings: large hiatal hernia, toupet fundoplication   The patient was taken back to the operating room and placed in the supine position with bilateral SCDs in place. The patient was prepped and draped in the usual sterile fashion. After appropriate antibiotics were confirmed a timeout was called and all facts were verified.   A Veress needle technique was used to insufflate the abdomen to 15 mm of mercury the paramedian stab incision. Subsequent to this an 8 mm trocar was introduced as was a 8 millimeter camera. At this time the subsequent robotic trochars x3, were then placed adjacent to this trocar approximately 8-10 cm away. Each trocar was inserted under direct visualization, there were total of 4 trochars. A 12mm trocar was placed in the midclavicular line.  A 0vicryl was placed to help with closure at the end of the case.The assistant trocar was then placed in the right lower quadrant under direct visualization. The Nathanson retractor was then visualized inserted into the abdomen and the incision just to the left of the falciform ligament. This was then placed to retract the liver appropriately. At this time the patient was positioned in reverse Trendelenburg.   At this time the robot patient cart was brought to the bedside and placed in good  position and the arms were docked to the trochars appropriately. At this time I proceeded to incised the gastrohepatic ligament.  At this time I proceeded to mobilize the stomach inferiorly and visualize the right crus. The peritoneum over the right crus was incised and right crus was identified. I proceeded to dissect this inferiorly until the left crus was seen joining the right crus. Once the right crus was adequately dissected we turned our to the left crus which was dissected away. This required traction of the stomach to the right side. Once this was visualized we then proceeded to circumferentially dissect the esophagus away from the surrounding tissue. The anterior and posterior vagus was seen along the esophagus at the GE junction.  These were both preserved throughout the entire case.  At this time the phrenoesophageal fat pad was dissected away from the esophagus. There was a moderate-sized hiatal hernia seen. I mobilized the esophagus cephalad approximately 4-5 cm, clearing away the surrounding tissue. The anterior hernia sac was dissected away from the stomach and esophagus.  At this time we turned our attention to the greater curvature the stomach and the omentum was mobilized using the robotic vessel sealer. This was taken up to the greater curvature to the hiatus. This mobilized the entire greater curvature to allow mobilization and the wrap. I then proceeded to bring the greater curvature the stomach posterior to the esophagus, and a shoeshine technique was used to evaluate the mobilization of the greater curvature.   At this time I proceeded to close  the hiatus using interrupted 0 Ethibonds x 3. This brought together the hiatal closure without undue stricture to the esophagus.   A piece of Gore Bio A hiatal mesh was placed over the hiatal closure and sutured to the crus using 0 Ethibonds sutures x 3.  At this time the greater curvature was brought around the esophagus and sutured using 0  silk sutures interrupted fashion approximately 1 cm apart x3 on each side of the esophagus in a Toupet fashion. A left collar stitch was then used to gastropexy the stomach from the wrap to the diaphragm just lateral to the left crus as.  A second collar stitch was placed from the wrap to the right crus.  The wrap lay loose with no strangulation of the esophagus.  At this time the robot was undocked. The liver trocar was removed. At this time insufflation was evacuated. Skin was reapproximated for Monocryl subcuticular fashion. The skin was then dressed with Dermabond. The patient tolerated the procedure well and was taken to the recovery room in stable condition.    PLAN OF CARE: Admit to inpatient   PATIENT DISPOSITION:  PACU - hemodynamically stable.   Delay start of Pharmacological VTE agent (>24hrs) due to surgical blood loss or risk of bleeding: not applicable

## 2023-09-03 ENCOUNTER — Encounter (HOSPITAL_COMMUNITY): Payer: Self-pay | Admitting: General Surgery

## 2023-09-03 ENCOUNTER — Inpatient Hospital Stay (HOSPITAL_COMMUNITY)

## 2023-09-03 DIAGNOSIS — K3189 Other diseases of stomach and duodenum: Secondary | ICD-10-CM | POA: Diagnosis not present

## 2023-09-03 MED ORDER — BOOST / RESOURCE BREEZE PO LIQD CUSTOM
1.0000 | Freq: Three times a day (TID) | ORAL | Status: DC
  Administered 2023-09-03: 1 via ORAL

## 2023-09-03 MED ORDER — PANTOPRAZOLE SODIUM 40 MG PO TBEC: 40.0000 mg | DELAYED_RELEASE_TABLET | Freq: Every day | ORAL | 0 refills | Status: DC

## 2023-09-03 MED ORDER — IOHEXOL 300 MG/ML  SOLN
150.0000 mL | Freq: Once | INTRAMUSCULAR | Status: AC | PRN
  Administered 2023-09-03: 100 mL via ORAL

## 2023-09-03 MED ORDER — OXYCODONE HCL 5 MG PO TABS: 5.0000 mg | ORAL_TABLET | Freq: Four times a day (QID) | ORAL | 0 refills | Status: DC | PRN

## 2023-09-03 NOTE — Plan of Care (Signed)

## 2023-09-03 NOTE — Progress Notes (Signed)
 Mobility Specialist - Progress Note  (RA) Pre-mobility: 77 bpm HR, 93% SpO2 During mobility: 101 bpm HR, 88% SpO2 Post-mobility: 64 bpm HR, 94% SPO2   09/03/23 1059  Mobility  Activity Ambulated with assistance in hallway  Level of Assistance Standby assist, set-up cues, supervision of patient - no hands on  Assistive Device Front wheel walker  Distance Ambulated (ft) 250 ft  Range of Motion/Exercises Active  Activity Response Tolerated fair  Mobility Referral Yes  Mobility visit 1 Mobility  Mobility Specialist Start Time (ACUTE ONLY) 1030  Mobility Specialist Stop Time (ACUTE ONLY) 1059  Mobility Specialist Time Calculation (min) (ACUTE ONLY) 29 min   Pt was found in bed and agreeable to ambulate. Grew SOB with session and had x1 brief standing rest break due to <88% SPO2. Able to increase in 1 min. At EOS returned to bed with all needs met. Call bell in reach.  Billey Chang Mobility Specialist

## 2023-09-03 NOTE — Progress Notes (Signed)
 1 Day Post-Op   Subjective/Chief Complaint: Pt with min pain Esoph this AM   Objective: Vital signs in last 24 hours: Temp:  [97.5 F (36.4 C)-98.7 F (37.1 C)] 98.2 F (36.8 C) (04/11 0536) Pulse Rate:  [58-77] 72 (04/10 1927) Resp:  [11-20] 20 (04/11 0536) BP: (94-145)/(52-76) 98/71 (04/11 0626) SpO2:  [90 %-100 %] 99 % (04/11 0536) Weight:  [91.2 kg] 91.2 kg (04/10 1039) Last BM Date :  (pt unable to remember)  Intake/Output from previous day: 04/10 0701 - 04/11 0700 In: 1087.2 [P.O.:120; I.V.:617.2; IV Piggyback:350] Out: -  Intake/Output this shift: No intake/output data recorded.  GI: abd inc c/d/i  Lab Results:  Recent Labs    09/02/23 0511  WBC 5.4  HGB 10.1*  HCT 35.1*  PLT 317   BMET Recent Labs    09/02/23 0511  NA 137  K 3.8  CL 106  CO2 22  GLUCOSE 98  BUN 10  CREATININE 1.29*  CALCIUM 8.9   PT/INR No results for input(s): "LABPROT", "INR" in the last 72 hours. ABG No results for input(s): "PHART", "HCO3" in the last 72 hours.  Invalid input(s): "PCO2", "PO2"  Studies/Results: No results found.  Anti-infectives: Anti-infectives (From admission, onward)    Start     Dose/Rate Route Frequency Ordered Stop   09/02/23 1100  ceFAZolin (ANCEF) IVPB 2g/100 mL premix        2 g 200 mL/hr over 30 Minutes Intravenous On call to O.R. 09/01/23 0758 09/02/23 1228       Assessment/Plan: s/p Procedure(s) with comments: REPAIR, HERNIA, HIATAL, LAPAROSCOPIC (N/A) - ROBOTIC HIATAL HERNIA REPAIR WITH MESH AND FUNDOPLICATION POD 1 No leak on my read of esoph OK for pureed OK to DC home Dietician to see prior to DC F/u in 2-3 weeks  LOS: 7 days    Axel Filler 09/03/2023

## 2023-09-03 NOTE — Progress Notes (Signed)
 Nutrition Note  RD consulted for nutrition education regarding diet instructions s/p hiatal hernia repair with mesh and fundoplication. Recommended pureed diet for 2 weeks post-op.    RD provided "IDDSI Level 4 Pureed [place]) Nutrition Therapy" handout from the [employer]. Reviewed patient's dietary recall. Provided examples on ways to modify foods on pureed diet. Encouraged pt to blend foods using a blender or food processor and strain to obtain desired consistency (no lumps). Also encouraged use of commercial pureed foods and oral nutritional supplements such as Ensure to provide adequate calories and protein. Discussed how to modify recipes to add calories and protein.    RD discussed why it is important for patient to adhere to diet recommendations and discussed possibility of diet advancement upon MD follow-up. Teach back method used.   Expect fair compliance. Pt relies on restaurant meals for dinner. Has Boost at home so encouraged him to drink these with breakfast and lunch. Mainly consumed fish with mashed potatoes and applesauce for weeks prior to surgery. Provided Boost Breeze supplements this morning while on clear liquids.  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   Body mass index is 32.78 kg/m. Pt meets criteria for obesity based on current BMI.   Current diet order is dysphagia 1, patient is consuming approximately 100% of meals at this time but this was just clear liquids. Labs and medications reviewed. No further nutrition interventions warranted at this time.  If additional nutrition issues arise, please re-consult RD.    Tilda Franco, MS, RD, LDN Inpatient Clinical Dietitian Contact via Secure chat

## 2023-09-03 NOTE — Discharge Summary (Signed)
 Physician Discharge Summary  Chris [name]. ZOX:[id number] DOB: [birth year] DOA: 08/27/2023  PCP: Donita Brooks, MD  Admit date: 08/27/2023 Discharge date: 09/03/2023  Admitted From: Home Disposition:  Home  Discharge Condition:Stable CODE STATUS:FULL Diet recommendation: Dysphagia 1   Brief/Interim Summary:  Patient is a 83 year old male with history of hiatal hernia who presented with complaint of difficulty swallowing, epigastric discomfort, nausea, vomiting for 2 weeks.  On presentation, he was hemodynamically stable.  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  General surgery, GI consulted and following.EGD done on 4/7, no signs of obstruction.  General surgery did robotic hiatal hernia repair with mesh and fundoplication .  He has been tolerating dysphagia 1 diet.  Medically stable for discharge.  Following problems were addressed during the hospitalization:  Hiatal hernia: Previously known.  Presented with difficulty swallowing, nausea, vomiting, epigastric discomfort.  Multiple hospitalization in the past for the same.  General surgery, GI consulted. CT abdomen/pelvis did not show obstruction from his hernia. EGD in 08/30/2023, did not show any obstruction but showed large hiatal hernia containing at least one third of the stomach.  Continue Protonix General surgery did robotic hiatal hernia repair with mesh and fundoplication on 4/10.  Tolerating dysphagia 1 diet.  Denies abdomen pain, nausea or vomiting.  General surgery cleared for discharge.  Dietitian also consulted.   Renal cyst: No suspicious renal mass. There is a partially exophytic 4.1 x 4.1 cm simple cyst arising from the right kidney interpolar region, anteriorly. There is a 4 mm nonobstructing calculus in the right kidney lower pole calyx.Monitor as outpatient   History of syncope: Happened about 2 weeks ago.  Ambulating without  any problem  History of HFrEF: Currently euvolemic.  Last echo showed EF of 35 to 40%, regional wall motion abnormality.  Follows with Dr. Jacinto Halim   History of pulmonary fibrosis:Follows with pulmonology   Discharge Diagnoses:  Principal Problem:   Organoaxial gastric volvulus Active Problems:   Incarcerated hiatal hernia   Intractable vomiting with nausea   CAD (coronary artery disease)   HFrEF (heart failure with reduced ejection fraction) (HCC)   Hypertension   Renal insufficiency   Obese   Nocturnal hypoxia   OSA (obstructive sleep apnea)   Pulmonary fibrosis (HCC)   Dysphagia   Chronic anticoagulation   Memory loss or impairment   Spouse deceased   Unintentional weight loss   Syncope   Renal mass   Abdominal pain, epigastric   History of completed stroke    Discharge Instructions  Discharge Instructions     Diet general   Complete by: As directed    Dysphagia 1 diet   Discharge instructions   Complete by: As directed    1)Please follow-up with general surgery as an outpatient.  You will be called for appointment   Increase activity slowly   Complete by: As directed       Allergies as of 09/03/2023       Reactions   Brilinta [ticagrelor] Shortness Of Breath   Alpha-gal Hives   Crestor [rosuvastatin Calcium] Other (See Comments)   Muscle pain- tolerating this 2022, however   Lipitor [atorvastatin] Other (See Comments)   Intolerable muscle pain all over         Medication List     TAKE these medications    acetaminophen 500 MG tablet Commonly known as: TYLENOL Take 500 mg by mouth every 6 (six) hours as needed for  mild pain.   Blood Glucose Monitoring Suppl Devi 1 each by Does not apply route in the morning, at noon, and at bedtime. May substitute to any manufacturer covered by patient's insurance.   clopidogrel 75 MG tablet Commonly known as: PLAVIX TAKE 1 TABLET EVERY DAY   cyanocobalamin 1000 MCG tablet Commonly known as: VITAMIN  B12 Take 1 tablet (1,000 mcg total) by mouth daily.   empagliflozin 10 MG Tabs tablet Commonly known as: Jardiance Take 1 tablet (10 mg total) by mouth daily before breakfast.   furosemide 40 MG tablet Commonly known as: LASIX Take 1 tablet (40 mg total) by mouth daily.   nitroGLYCERIN 0.4 MG SL tablet Commonly known as: NITROSTAT Place 1 tablet (0.4 mg total) under the tongue every 5 (five) minutes as needed for chest pain.   oxyCODONE 5 MG immediate release tablet Commonly known as: Oxy IR/ROXICODONE Take 1 tablet (5 mg total) by mouth every 6 (six) hours as needed (pain).   pantoprazole 40 MG tablet Commonly known as: PROTONIX Take 1 tablet (40 mg total) by mouth daily.   rosuvastatin 40 MG tablet Commonly known as: CRESTOR TAKE 1 TABLET EVERY DAY   spironolactone 25 MG tablet Commonly known as: ALDACTONE Take 0.5 tablets (12.5 mg total) by mouth daily.   sucralfate 1 g tablet Commonly known as: CARAFATE TAKE 1 TABLET BY MOUTH FOUR TIMES DAILY WITH MEALS AND AT BEDTIME   traZODone 50 MG tablet Commonly known as: DESYREL TAKE 1/2 TABLET(25 MG) BY MOUTH AT BEDTIME AS NEEDED FOR SLEEP What changed: See the new instructions.        Follow-up Information     Axel Filler, MD Follow up on 09/22/2023.   Specialty: General Surgery Why: 9:40am, Arrive 15 minutes prior to your appointment time, Please bring your insurance card and photo ID Contact information: [address] [place] [address] [phone]         Donita Brooks, MD. Schedule an appointment as soon as possible for a visit in 1 week(s).   Specialty: Family Medicine Contact information: [address] [place] [address] [phone]                Allergies  Allergen Reactions   Brilinta [Ticagrelor] Shortness Of Breath   Alpha-Gal Hives   Crestor [Rosuvastatin Calcium] Other (See Comments)    Muscle pain- tolerating this 2022, however   Lipitor  [Atorvastatin] Other (See Comments)    Intolerable muscle pain all over     Consultations: General surgery, GI   Procedures/Studies: DG ESOPHAGUS W SINGLE CM (SOL OR THIN BA) Result Date: 09/03/2023 CLINICAL DATA:  Postop hiatal hernia repair. EXAM: ESOPHOGRAM/BARIUM SWALLOW TECHNIQUE: Single contrast examination was performed using  water-soluble. FLUOROSCOPY: Radiation Exposure Index (as provided by the fluoroscopic device): 10.3 mGy Kerma COMPARISON:  None Available. FINDINGS: Hiatal hernia repair.  No leak. IMPRESSION: Hiatal hernia repair without evidence of a leak. Electronically Signed   By: Leanna Battles M.D.   On: 09/03/2023 09:20   CT ABDOMEN PELVIS W CONTRAST Result Date: 08/27/2023 CLINICAL DATA:  Epigastric pain Large hiatal hernia, prior gastric volvulus. Rule out obstruction. EXAM: CT ABDOMEN AND PELVIS WITH CONTRAST TECHNIQUE: Multidetector CT imaging of the abdomen and pelvis was performed using the standard protocol following bolus administration of intravenous contrast. RADIATION DOSE REDUCTION: This exam was performed according to the departmental dose-optimization program which includes automated exposure control, adjustment of the mA and/or kV according to patient size  and/or use of iterative reconstruction technique. CONTRAST:  OMNIPAQUE IOHEXOL 300 MG/ML  SOLN COMPARISON:  CT scan abdomen and pelvis from 08/11/2023.  The FINDINGS: Lower chest: There are reticulations in the visualized bilateral lungs, favoring pulmonary fibrosis. Bronchiectatic changes noted in the right lung base. However, no honeycombing. No pleural effusion. The heart is normal in size. No pericardial effusion. Hepatobiliary: The liver is normal in size. Non-cirrhotic configuration. No suspicious mass. These is mild diffuse hepatic steatosis. No intrahepatic or extrahepatic bile duct dilation. No calcified gallstones. Normal gallbladder wall thickness. No pericholecystic inflammatory changes. Pancreas:  Unremarkable. No pancreatic ductal dilatation or surrounding inflammatory changes. Spleen: Within normal limits. No focal lesion. Adrenals/Urinary Tract: Adrenal glands are unremarkable. No suspicious renal mass. There is a partially exophytic 4.1 x 4.1 cm simple cyst arising from the right kidney interpolar region, anteriorly. There is a 4 mm nonobstructing calculus in the right kidney lower pole calyx. No other nephroureterolithiasis on either side. Urinary bladder is under distended, precluding optimal assessment. However, no large mass or stones identified. No perivesical fat stranding. 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 However, no abnormal wall thickening, perigastric fat stranding or abnormal distension to suggest gastric outlet obstruction. The configuration of hiatal hernia is essentially unchanged since the prior study. No disproportionate dilation of the small or large bowel loops. No evidence of abnormal bowel wall thickening or inflammatory changes. The appendix is unremarkable. There are multiple diverticula mainly in the sigmoid colon, without imaging signs of diverticulitis. Vascular/Lymphatic: No ascites or pneumoperitoneum. No abdominal or pelvic lymphadenopathy, by size criteria. No aneurysmal dilation of the major abdominal arteries. There are moderate peripheral atherosclerotic vascular calcifications of the aorta and its major branches. Reproductive: Normal size prostate. Symmetric seminal vesicles. Other: The visualized soft tissues and abdominal wall are unremarkable. Musculoskeletal: No suspicious osseous lesions. There are mild - moderate multilevel degenerative changes in the visualized spine. IMPRESSION: 1. No acute inflammatory process identified within the abdomen or pelvis. 2. 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 No evidence of gastric outlet obstruction. 3.  Multiple other nonacute observations, as described above. Aortic Atherosclerosis (ICD10-I70.0). Electronically Signed   By: Jules Schick M.D.   On: 08/27/2023 16:57   DG Abd Portable 1 View Result Date: 08/27/2023 CLINICAL DATA:  Abdominal pain. EXAM: PORTABLE ABDOMEN - 1 VIEW COMPARISON:  10/01/2020. FINDINGS: The bowel gas pattern is non-obstructive. No evidence of pneumoperitoneum, within the limitations of a supine film. No acute osseous abnormalities. There is a 3 mm calcification overlying the right renal shadow region, which can be seen with a renal calculus. The soft tissues are otherwise within normal limits. Surgical changes, devices, tubes and lines: None. IMPRESSION: Nonobstructive bowel gas pattern. Electronically Signed   By: Jules Schick M.D.   On: 08/27/2023 16:38   DG Chest 2 View Result Date: 08/27/2023 CLINICAL DATA:  Chest pain.  Difficulty swallowing. EXAM: CHEST - 2 VIEW COMPARISON:  Chest radiograph dated 06/24/2023. FINDINGS: The heart size and mediastinal contours are unchanged. Prior median sternotomy and CABG. Large hiatal hernia again noted. Bilateral basilar predominant chronic interstitial changes are again noted. No appreciable new focal consolidation, pleural effusion, or pneumothorax. No acute osseous abnormality. IMPRESSION: 1. 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 2. Similar large hiatal hernia. Electronically Signed   By: Hart Robinsons M.D.   On: 08/27/2023 13:35   CT ABDOMEN PELVIS W CONTRAST Result Date: 08/11/2023 CLINICAL DATA:  Evaluate for gastric outlet obstruction. EXAM: CT ABDOMEN AND PELVIS WITH CONTRAST TECHNIQUE: Multidetector CT imaging of the abdomen and pelvis was performed using the standard protocol following bolus administration of intravenous contrast. RADIATION DOSE REDUCTION: This exam was performed according to the departmental dose-optimization program which includes automated exposure control, adjustment of the mA and/or  kV according to patient size and/or use of iterative reconstruction technique. CONTRAST:  OMNIPAQUE IOHEXOL 300 MG/ML  SOLN COMPARISON:  CT of the chest abdomen pelvis dated 06/24/2023. FINDINGS: Evaluation of this exam is limited due to respiratory motion. Lower chest: Diffuse interstitial coarsening and fibrosis. There is coronary vascular calcification. No intra-abdominal free air or free fluid. Hepatobiliary: The liver is unremarkable. No biliary dilatation. The gallbladder is unremarkable. Pancreas: Unremarkable. No pancreatic ductal dilatation or surrounding inflammatory changes. Spleen: Normal in size without focal abnormality. Adrenals/Urinary Tract: The adrenal glands are unremarkable. A 4 cm right renal interpolar cyst. There is a 4 mm nonobstructing right renal inferior pole calculus. No hydronephrosis. There is no hydronephrosis or nephrolithiasis on the left. There is symmetric enhancement and excretion of contrast by both kidneys. The visualized ureters and urinary bladder appear unremarkable. Stomach/Bowel: There is sigmoid diverticulosis without active inflammatory changes. There is a large hiatal hernia containing the majority of the stomach. The stomach is not distended. No evidence of gastric outlet obstruction. There is no bowel obstruction. The appendix is normal. Vascular/Lymphatic: Moderate aortoiliac atherosclerotic disease. The IVC is unremarkable. No portal venous gas. There is no adenopathy. Reproductive: The prostate and seminal vesicles are grossly unremarkable. No pelvic mass. Other: None Musculoskeletal: Osteopenia with degenerative changes of the spine. No acute osseous pathology. IMPRESSION: 1. No acute intra-abdominal or pelvic pathology. 2. Large hiatal hernia containing the majority of the stomach. No evidence of gastric outlet obstruction. 3. Sigmoid diverticulosis. No bowel obstruction. Normal appendix. 4. A 4 mm nonobstructing right renal inferior pole calculus. No  hydronephrosis. Electronically Signed   By: Elgie Collard M.D.   On: 08/11/2023 10:29      Subjective: Patient seen and examined at bedside today.  Hemodynamically stable for discharge.  Denies any abdomen pain, nausea or vomiting.  Tolerating dysphagia 1 diet  Discharge Exam: Vitals:   09/03/23 0536 09/03/23 0626  BP: (!) 94/52 98/71  Pulse:    Resp: 20   Temp: 98.2 F (36.8 C)   SpO2: 99%    Vitals:   09/02/23 1721 09/02/23 1927 09/03/23 0536 09/03/23 0626  BP: 129/74 (!) 120/56 (!) 94/52 98/71  Pulse: 64 72    Resp:  20 20   Temp: 97.8 F (36.6 C) 98.7 F (37.1 C) 98.2 F (36.8 C)   TempSrc: Oral     SpO2: 100% 100% 99%   Weight:      Height:        General: Pt is alert, awake, not in acute distress Cardiovascular: RRR, S1/S2 +, no rubs, no gallops Respiratory: CTA bilaterally, no wheezing, no rhonchi Abdominal: Soft, NT, ND, bowel sounds + Extremities: no edema, no cyanosis    The results of significant diagnostics from this hospitalization (including imaging, microbiology, ancillary and laboratory) are listed below for reference.     Microbiology: No results found for this or any previous visit (from the past 240 hours).   Labs: BNP (last 3 results) Recent Labs    06/24/23 2230  BNP 135.3*   Basic Metabolic Panel: Recent Labs  Lab 08/28/23 0548 09/02/23 0511  NA 139 137  K 3.8 3.8  CL 107 106  CO2 23 22  GLUCOSE 120* 98  BUN 12 10  CREATININE 1.21 1.29*  CALCIUM 8.8* 8.9   Liver Function Tests: No results for input(s): "AST", "ALT", "ALKPHOS", "BILITOT", "PROT", "ALBUMIN" in the last 168 hours. No results for input(s): "LIPASE", "AMYLASE" in the last 168 hours. No results for input(s): "AMMONIA" in the last 168 hours. CBC: Recent Labs  Lab 08/28/23 0548 09/02/23 0511  WBC 6.1 5.4  HGB 9.2* 10.1*  HCT 33.7* 35.1*  MCV 74.6* 72.4*  PLT 291 317   Cardiac Enzymes: No results for input(s): "CKTOTAL", "CKMB", "CKMBINDEX",  "TROPONINI" in the last 168 hours. BNP: Invalid input(s): "POCBNP" CBG: Recent Labs  Lab 09/02/23 1057  GLUCAP 110*   D-Dimer No results for input(s): "DDIMER" in the last 72 hours. Hgb A1c No results for input(s): "HGBA1C" in the last 72 hours. Lipid Profile No results for input(s): "CHOL", "HDL", "LDLCALC", "TRIG", "CHOLHDL", "LDLDIRECT" in the last 72 hours. Thyroid function studies No results for input(s): "TSH", "T4TOTAL", "T3FREE", "THYROIDAB" in the last 72 hours.  Invalid input(s): "FREET3" Anemia work up No results for input(s): "VITAMINB12", "FOLATE", "FERRITIN", "TIBC", "IRON", "RETICCTPCT" in the last 72 hours. Urinalysis    Component Value Date/Time   COLORURINE YELLOW 10/15/2020 1321   APPEARANCEUR CLEAR 10/15/2020 1321   LABSPEC 1.020 10/15/2020 1321   PHURINE 5.5 10/15/2020 1321   GLUCOSEU NEGATIVE 10/15/2020 1321   HGBUR NEGATIVE 10/15/2020 1321   BILIRUBINUR NEGATIVE 10/15/2020 1321   KETONESUR TRACE (A) 10/15/2020 1321   PROTEINUR NEGATIVE 10/15/2020 1321   NITRITE NEGATIVE 10/15/2020 1321   LEUKOCYTESUR NEGATIVE 10/15/2020 1321   Sepsis Labs Recent Labs  Lab 08/28/23 0548 09/02/23 0511  WBC 6.1 5.4   Microbiology No results found for this or any previous visit (from the past 240 hours).  Please note: You were cared for by a hospitalist during your hospital stay. Once you are discharged, your primary care physician will handle any further medical issues. Please note that NO REFILLS for any discharge medications will be authorized once you are discharged, as it is imperative that you return to your primary care physician (or establish a relationship with a primary care physician if you do not have one) for your post hospital discharge needs so that they can reassess your need for medications and monitor your lab values.    Time coordinating discharge: 40 minutes  SIGNED:   Burnadette Pop, MD  [facility] 09/03/2023, 12:54 PM Pager  [phone]  If 7PM-7AM, please contact night-coverage [email or web address] Password [email or web address]

## 2023-09-06 ENCOUNTER — Telehealth: Payer: Self-pay | Admitting: *Deleted

## 2023-09-06 NOTE — Transitions of Care (Post Inpatient/ED Visit) (Signed)
   09/06/2023  Name: Chris [name]. MRN: [id number] DOB: 07-21-1940  Today's TOC FU Call Status: Today's TOC FU Call Status:: Unsuccessful Call (1st Attempt) Unsuccessful Call (1st Attempt) Date: 09/06/23  Attempted to reach the patient regarding the most recent Inpatient/ED visit.  Follow Up Plan: Additional outreach attempts will be made to reach the patient to complete the Transitions of Care (Post Inpatient/ED visit) call.   Cecilie Coffee [facility], BSN RN Care Manager/ Transition of Care / [facility] [phone]

## 2023-09-07 ENCOUNTER — Telehealth: Payer: Self-pay | Admitting: *Deleted

## 2023-09-07 NOTE — Transitions of Care (Post Inpatient/ED Visit) (Signed)
   09/07/2023  Name: Chris [name]. MRN: [id number] DOB: 1940-07-02  Today's TOC FU Call Status: Today's TOC FU Call Status:: Unsuccessful Call (2nd Attempt) Unsuccessful Call (2nd Attempt) Date: 09/07/23  Attempted to reach the patient regarding the most recent Inpatient/ED visit.  Follow Up Plan: Additional outreach attempts will be made to reach the patient to complete the Transitions of Care (Post Inpatient/ED visit) call.   Una Ganser BSN RN [facility] Health Care Management Coordinator [email or web address] Direct Dial: [phone]  Fax: [phone] Website: [email or web address]

## 2023-09-08 ENCOUNTER — Telehealth: Payer: Self-pay | Admitting: *Deleted

## 2023-09-08 ENCOUNTER — Telehealth: Payer: Self-pay

## 2023-09-08 NOTE — Transitions of Care (Post Inpatient/ED Visit) (Signed)
 09/08/2023  Name: Chris [name]. MRN: [id number] DOB: 22-Dec-1940  Today's TOC FU Call Status: Today's TOC FU Call Status:: Successful TOC FU Call Completed TOC FU Call Complete Date: 09/08/23 Patient's Name and Date of Birth confirmed.  Transition Care Management Follow-up Telephone Call Date of Discharge: 09/03/23 Discharge Facility: [facility]) Type of Discharge: Inpatient Admission Primary Inpatient Discharge Diagnosis:: Organoxial gastric volvulus How have you been since you were released from the hospital?: Better Any questions or concerns?: No  Items Reviewed: Did you receive and understand the discharge instructions provided?: Yes Medications obtained,verified, and reconciled?: Yes (Medications Reviewed) Any new allergies since your discharge?: No Dietary orders reviewed?: Yes Type of Diet Ordered:: Dysphagia 1- patient reports taking a liquid diet Do you have support at home?: Yes People in Home [RPT]: grandchild(ren) Name of Support/Comfort Primary Source: Grandson in the home @ night  Medications Reviewed Today: Medications Reviewed Today     Reviewed by Fleeta Emmer, RN (Case Manager) on 09/08/23 at 1411  Med List Status: <None>   Medication Order Taking? Sig Documenting Provider Last Dose Status Informant  acetaminophen (TYLENOL) 500 MG tablet [id number] Yes Take 500 mg by mouth every 6 (six) hours as needed for mild pain. [provider] Taking Active Self, Pharmacy Records  Blood Glucose Monitoring Suppl DEVI [id number] Yes 1 each by Does not apply route in the morning, at noon, and at bedtime. May substitute to any manufacturer covered by patient's insurance. Donita Brooks, MD Taking Active Self, Pharmacy Records  clopidogrel (PLAVIX) 75 MG tablet [id number] Yes TAKE 1 TABLET EVERY DAY Donita Brooks, MD Taking Active Self, Pharmacy Records           Med Note Pascal Lux Jul 19, 2023  3:04 PM)    cyanocobalamin (VITAMIN B12)  1000 MCG tablet [id number] Yes Take 1 tablet (1,000 mcg total) by mouth daily. Donita Brooks, MD Taking Active Self, Pharmacy Records  empagliflozin (JARDIANCE) 10 MG TABS tablet [id number] No Take 1 tablet (10 mg total) by mouth daily before breakfast.  Patient not taking: Reported on 08/28/2023   Donita Brooks, MD Not Taking Active Self, Pharmacy Records  furosemide (LASIX) 40 MG tablet [id number] Yes Take 1 tablet (40 mg total) by mouth daily. Delma Freeze, FNP Taking Active Self, Pharmacy Records  nitroGLYCERIN (NITROSTAT) 0.4 MG SL tablet [id number] Yes Place 1 tablet (0.4 mg total) under the tongue every 5 (five) minutes as needed for chest pain. Donita Brooks, MD Taking Active Self, Pharmacy Records  oxyCODONE (OXY IR/ROXICODONE) 5 MG immediate release tablet [id number]  Take 1 tablet (5 mg total) by mouth every 6 (six) hours as needed (pain). Barnetta Chapel, PA-C  Active            Med Note Karilyn Cota, Lylianna Fraiser J   Wed Sep 08, 2023  2:04 PM) Patient reports he ran out  pantoprazole (PROTONIX) 40 MG tablet [id number] Yes Take 1 tablet (40 mg total) by mouth daily. Burnadette Pop, MD Taking Active   rosuvastatin (CRESTOR) 40 MG tablet [id number] Yes TAKE 1 TABLET EVERY DAY Yates Decamp, MD Taking Active Self, Pharmacy Records  spironolactone (ALDACTONE) 25 MG tablet [id number] Yes Take 0.5 tablets (12.5 mg total) by mouth daily. Delma Freeze, FNP Taking Active Self, Pharmacy Records  sucralfate (CARAFATE) 1 g tablet [id number] No TAKE 1 TABLET BY MOUTH FOUR TIMES DAILY WITH MEALS AND AT BEDTIME  Patient not taking: No sig reported  Austine Lefort, MD Not Taking Active Self, Pharmacy Records  traZODone (DESYREL) 50 MG tablet [id number] Yes TAKE 1/2 TABLET(25 MG) BY MOUTH AT BEDTIME AS NEEDED FOR SLEEP Austine Lefort, MD Taking Active             Home Care and Equipment/Supplies: Were Home Health Services Ordered?: No Any new equipment or medical supplies ordered?: No  Functional  Questionnaire: Do you need assistance with bathing/showering or dressing?: No Do you need assistance with meal preparation?: No Do you need assistance with eating?: No Do you have difficulty maintaining continence: No Do you need assistance with getting out of bed/getting out of a chair/moving?: No Do you have difficulty managing or taking your medications?: No  Follow up appointments reviewed: PCP Follow-up appointment confirmed?: Yes Date of PCP follow-up appointment?: 09/10/23 Follow-up Provider: Dr. Cheril Cork Specialist [facility] Follow-up appointment confirmed?: Yes Date of Specialist follow-up appointment?: 09/22/23 Follow-Up Specialty Provider:: Dr. Melton Squires Do you need transportation to your follow-up appointment?: No Do you understand care options if your condition(s) worsen?: Yes-patient verbalized understanding  Spoke with patient. He reports he is doing okay but having pain in stomach.  He reports pain @ 1-2 right now sitting down but  a 6-8 when moving.  Advised pain to be expected and to support surgical sites when going from sitting to standing.  He reports running out of oxycodone. Advised to try tylenol. He states he will try tylenol.  CM called Dr. Carmell Chiquito office for possible refill.  Office triage states they will call patient back after speaking with physician. Patient reports he has 4 incisions.  He states the one at his navel has some bloody discharge that gets on his shirt but states is minimal.  Discussed signs of infection and when to notify physician.  He verbalized understanding. Offered 30 day TOC program. Patient declined follow up.    Treg Diemer J. Rodgers Likes RN, MSN [facility], [facility] Health RN Care Manager Direct Dial: [phone]  Fax: [phone] Website: [email or web address]

## 2023-09-08 NOTE — Patient Instructions (Signed)
 Visit Information  Thank you for taking time to visit with me today. Please don't hesitate to contact me if I can be of assistance to you before our next scheduled telephone appointment.  Care  Instructions Incision care  Take care of your cuts from surgery as told.  Leave stitches or skin glue alone. Check the area around your cuts from surgery every day for signs of infection. Check for: Redness, swelling, or more pain. Fluid or blood. Warmth. Pus or a bad smell. Take pain medication as needed  Patient verbalizes understanding of instructions and care plan provided today and agrees to view in MyChart. Active MyChart status and patient understanding of how to access instructions and care plan via MyChart confirmed with patient.     The patient has been provided with contact information for the care management team and has been advised to call with any health related questions or concerns.   Please call the care guide team at [phone] if you need to cancel or reschedule your appointment.   Please call the Suicide and Crisis Lifeline: 988 if you are experiencing a Mental Health or Behavioral Health Crisis or need someone to talk to.  Hannalee Castor J. Marvetta Vohs RN, MSN [facility], [facility] RN Care Manager Direct Dial: [phone]  Fax: [phone] Website: [email or web address]

## 2023-09-08 NOTE — Progress Notes (Signed)
 Complex Care Management Note Care Guide Note  09/08/2023 Name: Chris [name]. MRN: [id number] DOB: 1940-05-27   Complex Care Management Outreach Attempts: An unsuccessful telephone outreach was attempted today to offer the patient information about available complex care management services.  Follow Up Plan:  Additional outreach attempts will be made to offer the patient complex care management information and services.   Encounter Outcome:  No Answer  .Gust Leghorn  [facility] HealthPopulation Health Care Guide  Direct [phone] Fax:[phone] Website: [email or web address]

## 2023-09-10 ENCOUNTER — Ambulatory Visit: Admitting: Family Medicine

## 2023-09-10 ENCOUNTER — Encounter: Payer: Self-pay | Admitting: Family Medicine

## 2023-09-10 VITALS — BP 120/62 | HR 78 | Temp 97.7°F | Ht 72.0 in | Wt 204.0 lb

## 2023-09-10 DIAGNOSIS — I5042 Chronic combined systolic (congestive) and diastolic (congestive) heart failure: Secondary | ICD-10-CM

## 2023-09-10 MED ORDER — EMPAGLIFLOZIN 10 MG PO TABS: 10.0000 mg | ORAL_TABLET | Freq: Every day | ORAL | 3 refills | Status: AC

## 2023-09-10 MED ORDER — SPIRONOLACTONE 25 MG PO TABS: 12.5000 mg | ORAL_TABLET | Freq: Every day | ORAL | 3 refills | Status: DC

## 2023-09-10 NOTE — Progress Notes (Signed)
 Subjective:    Patient ID: Chris Ridge., male    DOB: January 25, 1941, 83 y.o.   MRN: [id number] 07/20/23 Patient is an 83 year old Caucasian gentleman who presents with shortness of breath and dyspnea on exertion.  He recently had a catheterization of his heart due to the shortness of breath.  His bypass and stents were found to be widely patent with no flow-limiting lesions.  Patient had an echocardiogram in January that showed an ejection fraction of 35%.  In the past he has been on carvedilol , Jardiance , and Entresto .  However he discontinued the medicines due to dizziness.  He also has a history of interstitial lung disease.  In 2022 he saw pulmonology.  Their opinion at that time was that he had pulmonary fibrosis.  He was recommended to start antifibrotic therapy but he declined treatment at that point and was lost to follow-up.  He presents today complaining of dyspnea on exertion.  He reports cough productive of green mucus and morning.  Per his CT scan also she bronchiectasis in addition to his interstitial lung disease.  He is not currently taking any type of bronchodilator.  At that time, my plan was: Recent hemoglobin A1c was acceptable at 6.8.  This was checked in January.  Therefore his diabetes is adequately controlled with diet.  I believe his dyspnea is multifactorial and related to congestive heart failure as well as pulmonary fibrosis.  He has an appointment to meet with a cardiologist who specializes in congestive heart failure in March.  I encouraged him to reconsider the medications that he stopped in the past.  He would like to discuss with them the first.  Prior to starting stress I would like check his renal function given his recent catheterization and his history of chronic kidney disease.  If his potassium and renal function will allow I would suggest trying Entresto .  He also has pulmonary fibrosis and therefore I will be consult pulmonology to see if he is a candidate for  antifibrotic therapy  09/10/23 Since I last saw the patient, he was admitted to the hospital with nausea and vomiting abdominal pain.  Patient was found to have a large hiatal hernia.  He underwent laparoscopic mesh and fundoplication.  He is here today for follow-up.  He denies any abdominal pain.  He is tolerating soup, Jell-O, and boost.  He has a follow-up later this month with surgery and was instructed not to his advance his diet until cleared by surgery.  He denies any abdominal pain today.  He denies any fevers or chills.  He denies any hematemesis.  He has an appointment pending and may need to see the pulmonologist.  However since I last saw the patient, he discontinued furosemide , spironolactone , and Jardiance .  He is not currently taking any therapy for his congestive heart failure.  He states that these medicines killed his wife. Past Medical History:  Diagnosis Date   Abnormal exercise myocardial perfusion study    03/2018- 38% ef, see report   Arthritis    Chronic kidney disease    Congestive heart failure (CHF) (HCC) 09/2015   Coronary artery disease    DOE (dyspnea on exertion)    ED (erectile dysfunction)    GERD (gastroesophageal reflux disease)    occ   Gout    H/O hiatal hernia    Hyperlipidemia    Hypertension    Myocardial infarction (HCC)    Neuropathy    toes   Non-ST elevation (NSTEMI)  myocardial infarction (HCC) 02/15/2014   Obesity    RBBB (right bundle branch block)    Stenosis of right internal carotid artery    Type II diabetes mellitus (HCC)    Umbilical hernia    a. s/p repair.   Vertigo     Past Surgical History:  Procedure Laterality Date   ANKLE ARTHROSCOPY WITH DRILLING/MICROFRACTURE Left 04/13/2013   Procedure: LEFT ANKLE ARTHROSCOPY WITH EXTENSIVE DEBRIEDMENT;  Surgeon: Amada Backer, MD;  Location: [facility];  Service: Orthopedics;  Laterality: Left;   ANKLE SURGERY Left 08/18/2013   DR HEWITT   CARDIAC CATHETERIZATION  2000    stents x3   CARDIAC CATHETERIZATION  02/15/2014   Procedure: LEFT HEART CATH AND CORS/GRAFTS ANGIOGRAPHY;  Surgeon: Jessica Morn, MD;  Location: [facility] CATH LAB;  Service: Cardiovascular;;   COLONOSCOPY     CORONARY ARTERY BYPASS GRAFT  1995   LIMA to LAD, SVG to RCA, SVG to OM.    CORONARY STENT INTERVENTION N/A 08/12/2020   Procedure: CORONARY STENT INTERVENTION;  Surgeon: Cody Das, MD;  Location: [facility] INVASIVE CV LAB;  Service: Cardiovascular;  Laterality: N/A;   CORONARY STENT INTERVENTION N/A 09/04/2021   Procedure: CORONARY STENT INTERVENTION;  Surgeon: [name], MD;  Location: [facility] INVASIVE CV LAB;  Service: Cardiovascular;  Laterality: N/A;   ESOPHAGOGASTRODUODENOSCOPY N/A 08/30/2023   Procedure: EGD (ESOPHAGOGASTRODUODENOSCOPY);  Surgeon: Nannette Babe, MD;  Location: [facility];  Service: Gastroenterology;  Laterality: N/A;   HIATAL HERNIA REPAIR N/A 09/02/2023   Procedure: REPAIR, HERNIA, HIATAL, LAPAROSCOPIC;  Surgeon: Shela Derby, MD;  Location: [facility];  Service: General;  Laterality: N/A;  ROBOTIC HIATAL HERNIA REPAIR WITH MESH AND FUNDOPLICATION   LEFT HEART CATH AND CORS/GRAFTS ANGIOGRAPHY N/A 08/12/2020   Procedure: LEFT HEART CATH AND CORS/GRAFTS ANGIOGRAPHY;  Surgeon: Cody Das, MD;  Location: [facility];  Service: Cardiovascular;  Laterality: N/A;   LEFT HEART CATH AND CORS/GRAFTS ANGIOGRAPHY N/A 09/04/2021   Procedure: LEFT HEART CATH AND CORS/GRAFTS ANGIOGRAPHY;  Surgeon: [name], MD;  Location: [facility];  Service: Cardiovascular;  Laterality: N/A;   RIGHT/LEFT HEART CATH AND CORONARY/GRAFT ANGIOGRAPHY N/A 07/05/2023   Procedure: RIGHT/LEFT HEART CATH AND CORONARY/GRAFT ANGIOGRAPHY;  Surgeon: [name], MD;  Location: [facility];  Service: Cardiovascular;  Laterality: N/A;   TOTAL ANKLE ARTHROPLASTY Left 08/17/2013   Procedure: LEFT TOTAL ANKLE REPLACEMENT WITH POSSIBLE GASTROC RECESSION ;  Surgeon: Amada Backer, MD;   Location: [facility];  Service: Orthopedics;  Laterality: Left;   UMBILICAL HERNIA REPAIR  12/2007   Current Outpatient Medications on File Prior to Visit  Medication Sig Dispense Refill   acetaminophen  (TYLENOL ) 500 MG tablet Take 500 mg by mouth every 6 (six) hours as needed for mild pain.     Blood Glucose Monitoring Suppl DEVI 1 each by Does not apply route in the morning, at noon, and at bedtime. May substitute to any manufacturer covered by patient's insurance. 1 each 0   clopidogrel  (PLAVIX ) 75 MG tablet TAKE 1 TABLET EVERY DAY 90 tablet 3   cyanocobalamin  (VITAMIN B12) 1000 MCG tablet Take 1 tablet (1,000 mcg total) by mouth daily. 90 tablet 3   nitroGLYCERIN  (NITROSTAT ) 0.4 MG SL tablet Place 1 tablet (0.4 mg total) under the tongue every 5 (five) minutes as needed for chest pain. 15 tablet 3   oxyCODONE  (OXY IR/ROXICODONE ) 5 MG immediate release tablet Take 1 tablet (5 mg total) by mouth every 6 (six) hours  as needed (pain). 15 tablet 0   pantoprazole  (PROTONIX ) 40 MG tablet Take 1 tablet (40 mg total) by mouth daily. 60 tablet 0   rosuvastatin  (CRESTOR ) 40 MG tablet TAKE 1 TABLET EVERY DAY 90 tablet 3   sucralfate  (CARAFATE ) 1 g tablet TAKE 1 TABLET BY MOUTH FOUR TIMES DAILY WITH MEALS AND AT BEDTIME 360 tablet 0   traZODone  (DESYREL ) 50 MG tablet TAKE 1/2 TABLET(25 MG) BY MOUTH AT BEDTIME AS NEEDED FOR SLEEP 30 tablet 0   No current facility-administered medications on file prior to visit.   Allergies  Allergen Reactions   Brilinta  [Ticagrelor ] Shortness Of Breath   Alpha-Gal Hives   Crestor  [Rosuvastatin  Calcium ] Other (See Comments)    Muscle pain- tolerating this 2022, however   Lipitor [Atorvastatin ] Other (See Comments)    Intolerable muscle pain all over    Social History   Socioeconomic History   Marital status: Widowed    Spouse name: Not on file   Number of children: 4   Years of education: Not on file   Highest education level: Not on file  Occupational History    Not on file  Tobacco Use   Smoking status: Former    Current packs/day: 0.00    Average packs/day: 1 pack/day for 9.0 years (9.0 ttl pk-yrs)    Types: Cigarettes    Start date: 10/30/1961    Quit date: 10/31/1970    Years since quitting: 52.8   Smokeless tobacco: Former    Types: Chew   Tobacco comments:    chewed for 2 years in his 20's  Vaping Use   Vaping status: Never Used  Substance and Sexual Activity   Alcohol use: Not Currently    Comment: occ wine last 6 months   Drug use: No   Sexual activity: Yes  Other Topics Concern   Not on file  Social History Narrative   Pt's wife died 06/14/23 at [facility] on 5th floor.   Retired.   Social Drivers of Corporate investment banker Strain: Low Risk  (05/14/2022)   Overall Financial Resource Strain (CARDIA)    Difficulty of Paying Living Expenses: Not hard at all  Food Insecurity: No Food Insecurity (08/27/2023)   Hunger Vital Sign    Worried About Running Out of Food in the Last Year: Never true    Ran Out of Food in the Last Year: Never true  Transportation Needs: No Transportation Needs (08/27/2023)   PRAPARE - Administrator, Civil Service (Medical): No    Lack of Transportation (Non-Medical): No  Recent Concern: Transportation Needs - Unmet Transportation Needs (08/27/2023)   PRAPARE - Administrator, Civil Service (Medical): Yes    Lack of Transportation (Non-Medical): No  Physical Activity: Insufficiently Active (05/14/2022)   Exercise Vital Sign    Days of Exercise per Week: 3 days    Minutes of Exercise per Session: 30 min  Stress: No Stress Concern Present (05/14/2022)   [employer] of Occupational Health - Occupational Stress Questionnaire    Feeling of Stress : Only a little  Social Connections: Moderately Integrated (08/27/2023)   Social Connection and Isolation Panel [NHANES]    Frequency of Communication with Friends and Family: Three times a week    Frequency of Social Gatherings with  Friends and Family: Three times a week    Attends Religious Services: More than 4 times per year    Active Member of Clubs or Organizations: Yes  Attends Banker Meetings: More than 4 times per year    Marital Status: Widowed  Intimate Partner Violence: Not At Risk (08/27/2023)   Humiliation, Afraid, Rape, and Kick questionnaire    Fear of Current or Ex-Partner: No    Emotionally Abused: No    Physically Abused: No    Sexually Abused: No     Review of Systems  All other systems reviewed and are negative.      Objective:   Physical Exam Vitals reviewed.  Constitutional:      General: He is not in acute distress.    Appearance: He is obese. He is not ill-appearing or toxic-appearing.  Cardiovascular:     Rate and Rhythm: Normal rate and regular rhythm.     Heart sounds: No murmur heard.    No friction rub. No gallop.  Pulmonary:     Effort: Pulmonary effort is normal. No respiratory distress.     Breath sounds: No stridor. No wheezing, rhonchi or rales.  Abdominal:     General: Abdomen is flat. Bowel sounds are normal. There is no distension.     Palpations: Abdomen is soft.     Tenderness: There is no abdominal tenderness. There is no guarding.  Musculoskeletal:     Right lower leg: No edema.     Left lower leg: No edema.  Skin:    Findings: No erythema or rash.  Neurological:     Mental Status: He is alert.           Assessment & Plan:  Chronic combined systolic and diastolic CHF (congestive heart failure) (HCC) - Plan: CBC with Differential/Platelet, COMPLETE METABOLIC PANEL WITHOUT GFR Patient appears euvolemic today.  I agreed that he does not require furosemide  at the present time.  However I explained to the patient that he would benefit from spironolactone  and Jardiance  to help manage his congestive heart failure and reduce his risk of future hospitalizations and even death.  I asked him to resume these medications today.  I refilled them at his  local pharmacy.  I will check a CBC to monitor for any postoperative anemia and I will also check a CMP to review his electrolytes.  Strongly encourage compliance with his medication.  Patient discontinues his medications frequently

## 2023-09-11 LAB — CBC WITH DIFFERENTIAL/PLATELET
Absolute Lymphocytes: 1958 {cells}/uL (ref 850–3900)
Absolute Monocytes: 809 {cells}/uL (ref 200–950)
Basophils Absolute: 70 {cells}/uL (ref 0–200)
Basophils Relative: 0.8 %
Eosinophils Absolute: 513 {cells}/uL — ABNORMAL HIGH (ref 15–500)
Eosinophils Relative: 5.9 %
HCT: 36.5 % — ABNORMAL LOW (ref 38.5–50.0)
Hemoglobin: 10.4 g/dL — ABNORMAL LOW (ref 13.2–17.1)
MCH: 20.4 pg — ABNORMAL LOW (ref 27.0–33.0)
MCHC: 28.5 g/dL — ABNORMAL LOW (ref 32.0–36.0)
MCV: 71.7 fL — ABNORMAL LOW (ref 80.0–100.0)
MPV: 9.5 fL (ref 7.5–12.5)
Monocytes Relative: 9.3 %
Neutro Abs: 5351 {cells}/uL (ref 1500–7800)
Neutrophils Relative %: 61.5 %
Platelets: 428 10*3/uL — ABNORMAL HIGH (ref 140–400)
RBC: 5.09 10*6/uL (ref 4.20–5.80)
RDW: 18.3 % — ABNORMAL HIGH (ref 11.0–15.0)
Total Lymphocyte: 22.5 %
WBC: 8.7 10*3/uL (ref 3.8–10.8)

## 2023-09-11 LAB — COMPLETE METABOLIC PANEL WITHOUT GFR
AG Ratio: 1 (calc) (ref 1.0–2.5)
ALT: 10 U/L (ref 9–46)
AST: 19 U/L (ref 10–35)
Albumin: 4 g/dL (ref 3.6–5.1)
Alkaline phosphatase (APISO): 63 U/L (ref 35–144)
BUN/Creatinine Ratio: 13 (calc) (ref 6–22)
BUN: 17 mg/dL (ref 7–25)
CO2: 25 mmol/L (ref 20–32)
Calcium: 9.7 mg/dL (ref 8.6–10.3)
Chloride: 102 mmol/L (ref 98–110)
Creat: 1.29 mg/dL — ABNORMAL HIGH (ref 0.70–1.22)
Globulin: 4 g/dL — ABNORMAL HIGH (ref 1.9–3.7)
Glucose, Bld: 117 mg/dL — ABNORMAL HIGH (ref 65–99)
Potassium: 4.5 mmol/L (ref 3.5–5.3)
Sodium: 139 mmol/L (ref 135–146)
Total Bilirubin: 0.5 mg/dL (ref 0.2–1.2)
Total Protein: 8 g/dL (ref 6.1–8.1)

## 2023-09-12 ENCOUNTER — Other Ambulatory Visit: Payer: Self-pay | Admitting: Family Medicine

## 2023-09-12 DIAGNOSIS — I5042 Chronic combined systolic (congestive) and diastolic (congestive) heart failure: Secondary | ICD-10-CM

## 2023-09-12 DIAGNOSIS — I6521 Occlusion and stenosis of right carotid artery: Secondary | ICD-10-CM

## 2023-09-12 DIAGNOSIS — I25110 Atherosclerotic heart disease of native coronary artery with unstable angina pectoris: Secondary | ICD-10-CM

## 2023-09-13 ENCOUNTER — Telehealth: Payer: Self-pay | Admitting: *Deleted

## 2023-09-13 NOTE — Progress Notes (Signed)
 Complex Care Management Note Care Guide Note  09/13/2023 Name: Chris [name]. MRN: [id number] DOB: 1940/08/07   Complex Care Management Outreach Attempts: A second unsuccessful outreach was attempted today to offer the patient with information about available complex care management services.  Follow Up Plan:  Additional outreach attempts will be made to offer the patient complex care management information and services.   Encounter Outcome:  No Answer  [facility] HealthPopulation Health Care Guide  Direct [phone] [phone] Website: [email or web address]

## 2023-09-13 NOTE — Telephone Encounter (Signed)
 Requested medications are due for refill today.  A little too soon  Requested medications are on the active medications list.  yes  Last refill. 11/23/2022 #90 3 rf  Future visit scheduled.   no  Notes to clinic.  Abnormal labs.    Requested Prescriptions  Pending Prescriptions Disp Refills   clopidogrel  (PLAVIX ) 75 MG tablet [Pharmacy Med Name: Clopidogrel  Bisulfate Oral Tablet 75 MG] 90 tablet 3    Sig: TAKE 1 TABLET EVERY DAY     Hematology: Antiplatelets - clopidogrel  Failed - 09/13/2023  3:18 PM      Failed - HCT in normal range and within 180 days    HCT  Date Value Ref Range Status  09/10/2023 36.5 (L) 38.5 - 50.0 % Final   Hematocrit  Date Value Ref Range Status  06/24/2023 37.3 (L) 37.5 - 51.0 % Final         Failed - HGB in normal range and within 180 days    Hemoglobin  Date Value Ref Range Status  09/10/2023 10.4 (L) 13.2 - 17.1 g/dL Final  40/98/1191 47.8 (L) 13.0 - 17.7 g/dL Final         Failed - PLT in normal range and within 180 days    Platelets  Date Value Ref Range Status  09/10/2023 428 (H) 140 - 400 Thousand/uL Final  06/24/2023 361 150 - 450 x10E3/uL Final         Failed - Cr in normal range and within 360 days    Creat  Date Value Ref Range Status  09/10/2023 1.29 (H) 0.70 - 1.22 mg/dL Final   Creatinine, Urine  Date Value Ref Range Status  04/06/2022 CANCELED      Comment:    TEST NOT PERFORMED . No urine received.  Result canceled by the ancillary.          Failed - Valid encounter within last 6 months    Recent Outpatient Visits           3 days ago Chronic combined systolic and diastolic CHF (congestive heart failure) (HCC)   [facility] Austine Lefort, MD   1 month ago Type 2 diabetes mellitus with other circulatory complication, without long-term current use of insulin  [facility])   [place] [facility] Austine Lefort, MD   2 months ago Insomnia, unspecified type   [facility] Jenelle Mis, FNP   6 months ago Acute gout of left knee, unspecified cause   [facility] [facility] Austine Lefort, MD   7 months ago Unintentional weight loss   [place] [address] [facility] [name], Cisco Crest, MD       Future Appointments             In 1 month Ronald Cockayne, NP [facility]

## 2023-09-14 ENCOUNTER — Ambulatory Visit: Payer: Self-pay

## 2023-09-14 NOTE — Telephone Encounter (Signed)
 Copied from CRM [id number]. Topic: Clinical - Red Word Triage >> Sep 14, 2023  3:39 PM Marissa P wrote: Red Word that prompted transfer to Nurse Triage: Patient called in currently bleeding out his belly button unsure if she should go back to surgeon or not. He is scared and lost wife not too long ago from an infection. Seeking medical advice/ help on what to do   Chief Complaint: Post Op Incision Symptoms: Bleeding Frequency: Today Pertinent Negatives: Patient denies fever, chest pain, abdominal pain, vomiting, diarrhea Disposition: [] ED /[] Urgent Care (no appt availability in office) / [] Appointment(In office/virtual)/ []  [place] [facility]/ [] Home Care/ [] Refused Recommended Disposition /[x] [facility]/ [x]  Follow-up with PCP Additional Notes: KK is being triaged for post operative symptoms and incision symptoms. The patient states he is bleeding from one of the six incision sites from a previous abdominal surgery regarding a hiatal hernia. The patient opted to call the surgeon's office before disposition was recommended.   Reason for Disposition  [1] Bleeding from incision AND [2] won't stop after 10 minutes of direct pressure  Answer Assessment - Initial Assessment Questions 1. SYMPTOM: "What's the main symptom you're concerned about?" (e.g., drainage, incision opening up, pain, redness)     Bleeding  2. ONSET: "When did bleeding  start?"     Today  3. SURGERY: "What surgery did you have?"     Hiatal Hernia Surgery  4. DATE of SURGERY: "When was the surgery?"      Did not answer  5. INCISION SITE: "Where is the incision located?"      Navel, and Upper abdomen  6. REDNESS: "Is there any redness at the incision site?" If Yes, ask: "How wide across is the redness?" (Inches, centimeters)      Yes  7. PAIN: "Is there any pain?" If Yes, ask: "How bad is it?"  (Scale 1-10; or mild, moderate, severe)   - NONE (0): no pain   - MILD (1-3): doesn't interfere with normal  activities    - MODERATE (4-7): interferes with normal activities or awakens from sleep    - SEVERE (8-10): excruciating pain, unable to do any normal activities     None reported, did not answer.   8. BLEEDING: "Is there any bleeding?" If Yes, ask: "How much?" and "Where?"     Yes,  9. DRAINAGE: "Is there any drainage from the incision site?" If Yes, ask: "What color and how much?" (e.g., red, cloudy, pus; drops, teaspoon)     Red, Bright   10. FEVER: "Do you have a fever?" If Yes, ask: "What is your temperature, how was it measured, and when did it start?"       No  11. OTHER SYMPTOMS: "Do you have any other symptoms?" (e.g., dizziness, rash elsewhere on body, shaking chills, weakness)       Dizziness  Protocols used: Post-Op Incision Symptoms and Questions-A-AH

## 2023-09-15 ENCOUNTER — Telehealth: Payer: Self-pay | Admitting: *Deleted

## 2023-09-15 NOTE — Progress Notes (Signed)
 Complex Care Management Note Care Guide Note  09/15/2023 Name: Chris [name]. MRN: [id number] DOB: [birth year]   Complex Care Management Outreach Attempts: A third unsuccessful outreach was attempted today to offer the patient with information about available complex care management services.  Follow Up Plan:  No further outreach attempts will be made at this time. We have been unable to contact the patient to offer or enroll patient in complex care management services.  Encounter Outcome:  No Answer  [facility] Health Care Guide  Direct Dial:[phone] Fax:[phone] Website: [email or web address]

## 2023-09-30 ENCOUNTER — Encounter: Payer: Self-pay | Admitting: Pulmonary Disease

## 2023-09-30 ENCOUNTER — Ambulatory Visit: Payer: Medicare HMO | Admitting: Pulmonary Disease

## 2023-09-30 VITALS — BP 116/70 | HR 98 | Ht 68.0 in | Wt 198.0 lb

## 2023-09-30 DIAGNOSIS — I509 Heart failure, unspecified: Secondary | ICD-10-CM | POA: Diagnosis not present

## 2023-09-30 DIAGNOSIS — J849 Interstitial pulmonary disease, unspecified: Secondary | ICD-10-CM | POA: Diagnosis not present

## 2023-09-30 DIAGNOSIS — Z87891 Personal history of nicotine dependence: Secondary | ICD-10-CM | POA: Diagnosis not present

## 2023-09-30 NOTE — Patient Instructions (Signed)
 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 We also reviewed your recent hiatal hernia surgery and its positive impact on your swallowing and breathing. Additionally, we addressed the side effects you are experiencing from Jardiance  and your decision to discontinue it.  YOUR PLAN:  -PULMONARY FIBROSIS: Pulmonary fibrosis is a condition where the lung tissue becomes scarred and stiff, making it difficult to breathe. Your condition has been worsening, and we will order a CT chest scan in 1-2 months to assess its progression. We will follow up in 2-3 months to discuss the results and potential treatment options.  -HEART FAILURE WITH REDUCED EJECTION FRACTION: Heart failure with reduced ejection fraction means your heart is not pumping blood as well as it should, contributing to your shortness of breath. Your breathing has slightly improved since your hiatal hernia surgery.  -ADVERSE EFFECTS OF JARDIANCE : You have been experiencing significant side effects from Jardiance , including weakness and dizziness. You have decided to discontinue this medication due to these side effects.  -POST-SURGICAL STATUS OF HIATAL HERNIA REPAIR: You recently had surgery to repair a hiatal hernia, which has improved your swallowing and breathing. The surgery involved repositioning your stomach and placing a mesh patch.  -GOALS OF CARE: You are aware of the 3-5 year life expectancy without treatment for pulmonary fibrosis and have arranged end-of-life care. You prefer not to pursue aggressive treatments due to side effects and your current health status.  INSTRUCTIONS:  Please schedule a CT chest scan in 1-2 months and a follow-up appointment in 2-3 months to discuss the results and treatment options. Continue to monitor your symptoms and contact us  if you experience any significant changes.

## 2023-09-30 NOTE — Progress Notes (Signed)
 Chris [name]    [id number]    14-Jul-1940  Primary Care Physician:[name], Cisco Crest, MD  Referring Physician: Austine Lefort, MD [address] [place],  [place] [address]  Chief complaint: Follow-up for pulmonary fibrosis  HPI: 83 year old with CKD, CHF, CAD Evaluated in 2021 by Dr. 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  IPF was suspected and he was supposed to get ILD panel for further work-up but did not follow through.  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 Berry Bristol his cardiologist to reestablish care. Complains of chronic dyspnea on exertion over the past 2 years, occasional cough with mucus production.  Pets: Dog, has chickens outside his house Occupation: Used to own a [employer] Exposures: No mold, hot tub, Jacuzzi.  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  Follows with Dr. Bertrum Brodie  Interim history: Discussed the use of AI scribe software for clinical note transcription with the patient, who gave verbal consent to proceed.  History of Present Illness Chris [name]. is an 83 year old male with pulmonary fibrosis and heart failure who presents with worsening breathing difficulties.  He has a history of pulmonary fibrosis, diagnosed in 2022, which has been progressively worsening. He experiences increased difficulty in breathing, becoming short of breath with minimal exertion, such as walking from his house to his car. He has a history of asbestos exposure but has not been on medication for this condition due to concerns about side effects, which his  late wife experienced while on similar medication for rheumatoid arthritis.  He underwent surgery for a large hiatal hernia last week, which had previously caused significant swallowing difficulties and pain. The hernia had moved into his chest cavity, affecting his heart and lungs. Post-surgery, he reports improved swallowing and the ability to keep soft foods down. Prior to the surgery, he experienced severe pain and was hospitalized multiple times due to the inability to swallow, even water. The surgery involved pulling the stomach down and placing a mesh patch.  He has a history of heart failure with an ejection fraction of 35%. He experiences shortness of breath, which he attributes to both his heart and lung conditions. He is currently on Jardiance  for his heart, but it makes him feel weak and dizzy, similar to a previous experience with the medication. He wants to discontinue it due to these side effects.  He lives alone with his dog and has experienced significant personal loss, including the recent deaths of his wife and son. His son passed away after choking on a hot dog while in a rehabilitation center following multiple strokes.    Outpatient Encounter Medications as of 09/30/2023  Medication Sig   acetaminophen  (TYLENOL ) 500 MG tablet Take 500 mg by mouth every 6 (six) hours as needed for mild pain.   Blood Glucose Monitoring Suppl DEVI 1 each by Does not apply route in the morning, at noon, and at bedtime. May substitute to any manufacturer covered by patient's insurance.   clopidogrel  (PLAVIX ) 75 MG tablet TAKE 1 TABLET EVERY DAY   cyanocobalamin  (  VITAMIN B12) 1000 MCG tablet Take 1 tablet (1,000 mcg total) by mouth daily.   empagliflozin  (JARDIANCE ) 10 MG TABS tablet Take 1 tablet (10 mg total) by mouth daily before breakfast.   nitroGLYCERIN  (NITROSTAT ) 0.4 MG SL tablet Place 1 tablet (0.4 mg total) under the tongue every 5 (five) minutes as needed for chest pain.   oxyCODONE  (OXY  IR/ROXICODONE ) 5 MG immediate release tablet Take 1 tablet (5 mg total) by mouth every 6 (six) hours as needed (pain).   pantoprazole  (PROTONIX ) 40 MG tablet Take 1 tablet (40 mg total) by mouth daily.   rosuvastatin  (CRESTOR ) 40 MG tablet TAKE 1 TABLET EVERY DAY   spironolactone  (ALDACTONE ) 25 MG tablet Take 0.5 tablets (12.5 mg total) by mouth daily.   sucralfate  (CARAFATE ) 1 g tablet TAKE 1 TABLET BY MOUTH FOUR TIMES DAILY WITH MEALS AND AT BEDTIME   traZODone  (DESYREL ) 50 MG tablet TAKE 1/2 TABLET(25 MG) BY MOUTH AT BEDTIME AS NEEDED FOR SLEEP   No facility-administered encounter medications on file as of 09/30/2023.   Physical Exam: Blood pressure 124/66, pulse 77, temperature (!) 97 F (36.1 C), temperature source Oral, height 5\' 8"  (1.727 m), weight 231 lb (104.8 kg), SpO2 98 %. 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  CTD serologies positive for SCL 100  antibody which is likely nonspecific He was advised treatment with antifibrotic therapy in 2022 but he was previously reluctant to start treatment due to concern about side effects.  Pulmonary fibrosis is now worsening, with increased scarring at the lung bases noted on CT scans from recent CT abdomen. Discussed potential medications to slow progression, but significant side effects, including gastrointestinal upset, are concerning post-hiatal hernia surgery. He is not inclined to start new medications. Prognosis without treatment is typically 3-5 years, and he is already a couple of years into the condition.  - Order CT chest in 1-2 months to assess progression - Schedule follow-up in 2-3 months to discuss results and treatment options  Heart failure with reduced ejection fraction Heart failure with an ejection fraction of 35% contributes to dyspnea. Cardiologist suspects inadequate myocardial oxygenation. Breathing has slightly improved post-hiatal hernia surgery.  Post-surgical status of hiatal hernia repair Underwent laparoscopic hiatal hernia repair with mesh. Post-surgery, swallowing has improved, and he can tolerate soft foods. Previously experienced severe pain and dysphagia, necessitating multiple hospital visits. Breathing improvement likely due to stomach repositioning.  Goals of Care He is  aware of the 3-5 year life expectancy without treatment for pulmonary fibrosis and has arranged end-of-life care. Currently living alone with his dog, he is not interested in aggressive treatments due to side effects and current health status.    Plan/Recommendations: Return to clinic in 6 months   MDM   Phyllis Breeze MD [facility] 09/30/2023, 2:23 PM  CC: Austine Lefort, MD

## 2023-10-08 ENCOUNTER — Ambulatory Visit: Payer: Self-pay | Admitting: *Deleted

## 2023-10-08 NOTE — Telephone Encounter (Unsigned)
 Copied from CRM [id number]. Topic: General - Other >> Oct 08, 2023 11:39 AM Lysbeth Sauger wrote: Reason for CRM: got a message for a nurse

## 2023-10-08 NOTE — Telephone Encounter (Signed)
 Pt called in and would not tell agent what he was calling about. Pt cold transferred to me.    I have a question.   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   What part of a hog can I eat?   I can't have red meat.  I have a friend who has same thing.   He eats from the hog.  It doesn't bother him   It's white meat like chicken.  It's alright for him to eat that.   I want to be sure I can eat this before I eat it.    I need to know from Dr. Cheril Cork.   What parts can I eat from the hog?    I like tenderloin biscuits.   I had to stop eating them after the tick bite 2 yrs ago.   I want to eat tenderloin too.     I let pt know I would send Dr. Cheril Cork his question and someone will call him back.   He thanked me for my help.   Reason for Disposition  [1] Caller requesting NON-URGENT health information AND [2] PCP's office is the best resource  Answer Assessment - Initial Assessment Questions 1. REASON FOR CALL or QUESTION: "What is your reason for calling today?" or "How can I best help you?" or "What question do you have that I can help answer?"     See notes  Protocols used: Information Only Call - No Triage-A-AH

## 2023-10-08 NOTE — Telephone Encounter (Signed)
  Chief Complaint: Pt wanting to know what part of the hog can he eat?   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  He is wanting a tenderloin biscuit.    See notes for details. Symptoms: None  But not wanting to have problems because he ate meat he is not supposed to eat.    He knows red meat is out. Frequency: N/A Pertinent Negatives: Patient denies N/A Disposition: [] ED /[] Urgent Care (no appt availability in office) / [] Appointment(In office/virtual)/ []  [facility]/ [] Home Care/ [] Refused Recommended Disposition /[] [facility]/ [x]  Follow-up with PCP Additional Notes: I have sent a message to Dr. Cheril Cork.   Pt agreeable to someone calling him back.

## 2023-10-13 ENCOUNTER — Telehealth: Payer: Self-pay | Admitting: Family Medicine

## 2023-10-13 NOTE — Telephone Encounter (Signed)
 Copied from CRM [id number]. Topic: Clinical - Medical Advice >> Oct 13, 2023 12:19 PM Everette C wrote: Reason for CRM: The patient shares that they have a previous diagnosis of Alpha-gal Syndrome and would like to know if they are able to eat pork tenderloin  Please contact further if/when possible

## 2023-10-19 ENCOUNTER — Ambulatory Visit: Payer: Medicare HMO | Attending: Cardiology | Admitting: Cardiology

## 2023-10-19 ENCOUNTER — Encounter: Payer: Self-pay | Admitting: Cardiology

## 2023-10-19 VITALS — BP 122/70 | HR 72 | Ht 68.0 in | Wt 194.0 lb

## 2023-10-19 DIAGNOSIS — I5020 Unspecified systolic (congestive) heart failure: Secondary | ICD-10-CM

## 2023-10-19 DIAGNOSIS — Z8719 Personal history of other diseases of the digestive system: Secondary | ICD-10-CM | POA: Diagnosis not present

## 2023-10-19 DIAGNOSIS — I10 Essential (primary) hypertension: Secondary | ICD-10-CM | POA: Diagnosis not present

## 2023-10-19 DIAGNOSIS — E1122 Type 2 diabetes mellitus with diabetic chronic kidney disease: Secondary | ICD-10-CM | POA: Diagnosis not present

## 2023-10-19 DIAGNOSIS — J84112 Idiopathic pulmonary fibrosis: Secondary | ICD-10-CM

## 2023-10-19 DIAGNOSIS — E782 Mixed hyperlipidemia: Secondary | ICD-10-CM | POA: Diagnosis not present

## 2023-10-19 DIAGNOSIS — Z9889 Other specified postprocedural states: Secondary | ICD-10-CM

## 2023-10-19 DIAGNOSIS — I25118 Atherosclerotic heart disease of native coronary artery with other forms of angina pectoris: Secondary | ICD-10-CM | POA: Diagnosis not present

## 2023-10-19 DIAGNOSIS — N1832 Chronic kidney disease, stage 3b: Secondary | ICD-10-CM

## 2023-10-19 DIAGNOSIS — I6523 Occlusion and stenosis of bilateral carotid arteries: Secondary | ICD-10-CM | POA: Diagnosis not present

## 2023-10-19 NOTE — Progress Notes (Signed)
 Cardiology Office Note:  .   Date:  10/19/2023  ID:  Chris [name]., DOB 12/08/1940, MRN [id number] PCP: Austine Lefort, MD  [facility] Providers Cardiologist:  None    History of Present Illness: Chris [name] Chris [name]. is a 83 y.o. male with past medical history of hypertension, hyperlipidemia, type 2 diabetes, history of stroke without residual deficits, carotid artery stenosis, paroxysmal atrial fibrillation, coronary artery disease status post CABG with multiple subsequent PCI's returns today after recent discharge from the hospital.   Coronary artery disease with history of MI with CABG (1995) x3 vessels with LIMA to the LAD, SVG to RCA, SVG to OM, status post multiple coronary interventions.cardiac catheterization in 2000 with stent was placed x 3. Repeat left heart catheterization in 01/2014 with grafts without changes. He can underwent PCI to high-grade stenosis of the SVG to RCA in March 2022 and again in April 2023 needing aspiration thrombectomy followed by repeat stenting to the SVG to RCA who presented with an NSTEMI, EF had normalized from 35-40 to normal in May 2023. Carotid duplex completed 09/2021 revealed no significant change from the 2021 study with follow-up recommended in 6 months. He showed right ICA 50 to 69% and left ICA 1-15%. The study was completed in 04/24/2022 which revealed right ICA of 60-49%, and left ICA 60-49%, recommended repeat study yearly.   He was evaluated in clinic in January 2025 with continued complaint of shortness of breath with a 7 pound weight loss since being restarted on furosemide  therapy.  He was scheduled for right and left heart catheterization which revealed no significant progression of his coronary artery disease.  Aspirin  was discontinued and he remained on clopidogrel .  He was evaluated in [facility] emergency department 06/14/2023 with intractable nausea vomiting.  He was to follow-up with his PCP in 1 week continue PPI therapy, started on  iron  supplementation, repeat iron  profile after 3 to 6 months.  He was also scheduled with GI and general surgery for a hiatal hernia.  He was evaluated in clinic 07/15/2023 continue complaints of shortness of breath.  He stated ability.  Solid foods with discomfort.  He had concerns that this hernia was reducing his heart function.  He was sent for follow-up labs after his procedure.  There was further medication changes made at that visit was ordered.  He would need to be scheduled for an updated carotid duplex at his return appointment.Chris [name]   He was hospitalized at [facility] long hospital 4/4 - 09/03/2023 after presenting to the emergency department with difficulty swallowing, epigastric discomfort, nausea, vomiting for 2 weeks.  On presentation he was noted to be hemodynamically stable.  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  General surgery, GI were consulted.  An EGD was done on 4/7 with no signs of obstruction.  Surgery completed robotic hiatal hernia repair with mesh and fundoplication.  He was tolerating a dysphagia diet and was considered stable for discharge.  He returns to clinic today stating that overall he has been doing a little better since his surgery.  He continues to have chronic chest discomfort that is from shoulder to shoulder that he states he has had since he had his bypass surgery completed.  He has continued to notice a weight loss and is unable to increase his appetite and has been supplementing protein shakes.  He continues with his chronic shortness of breath that is unchanged.  He denies any peripheral edema.  Previously he was unable to tolerate GDMT due to lightheadedness and dizziness.  His primary care provider recently started him on Jardiance  and low-dose spironolactone  which he states that he has been tolerating without adverse side effects.  ROS: 10 point review of systems has been  reviewed and considered negative with the exception of what is been listed in the HPI  Studies Reviewed: Chris [name]   EKG Interpretation Date/Time:  Tuesday Oct 19 2023 15:44:02 EDT Ventricular Rate:  72 PR Interval:  186 QRS Duration:  134 QT Interval:  482 QTC Calculation: 527 R Axis:   150  Text Interpretation: Sinus rhythm with sinus arrhythmia with occasional Premature ventricular complexes Right bundle branch block When compared with ECG of 27-Aug-2023 12:12, PREVIOUS ECG IS PRESENT Confirmed by Ronald Cockayne ([id number]) on 10/19/2023 3:48:38 PM    Left Heart Catheterization 07/05/23: Hemodynamic data: EDP 15 mmHg, no pressure gradient across the aortic valve. Right heart hemodynamics where altered due to technical issues, moderate pulmonary hypertension present, PA pressure 44/6, mean 19 mmHg. PW 22/20, mean 18 mmHg. CO 5.66, CI 2.56. PVR 3.52. QP/QS 1.00.   Angiographic data: LM: Large vessel, mild disease. LAD: Occluded in the midsegment.  Distal segment supplied by LIMA to LAD which is widely patent. LCx: Occluded in the proximal to mid segment.  OM1 is supplied by SVG to OM which is widely patent. RCA: Occluded in the midsegment just proximal to previously placed stent.  SVG to RCA is patent, previously placed 3.5 x 38 mm resolute Onyx DES is widely patent in the distal RCA, proximal to mid 38 mm Taxus DES placed in May 2010 restented due to thrombotic lesion with a  4.0 x 12 mm resolute frontier DES placed 09/03/21 is widely patent      Impression and recommendations: No significant progression of coronary artery disease, no significant change in coronary anatomy since his latest stenting to his SVG to RCA on 09/11/2021.  Preserved cardiac output and cardiac index, mild pulm hypertension secondary to very mildly elevated EDP.  Discontinue aspirin , continue Plavix  alone.   2D echo 05/11/2023 1. Left ventricular ejection fraction, by estimation, is 35 to 40%. Left  ventricular ejection  fraction by PLAX is 47 %. The left ventricle has  moderately decreased function. The left ventricle demonstrates regional  wall motion abnormalities (mild global   hypokinesis with severe inferior wall hypokinesis). Left ventricular  diastolic parameters are consistent with Grade I diastolic dysfunction  (impaired relaxation).   2. Right ventricular systolic function is mild to moderately reduced. The  right ventricular size is mildly enlarged. Tricuspid regurgitation signal  is inadequate for assessing PA pressure.   3. The mitral valve is normal in structure. Mild mitral valve  regurgitation. No evidence of mitral stenosis.   4. The aortic valve is normal in structure. Aortic valve regurgitation is  not visualized. No aortic stenosis is present.   5. The inferior vena cava is normal in size with greater than 50%  respiratory variability, suggesting right atrial pressure of 3 mmHg.    Left Heart Catheterization 09/04/21:  LV: 121/10, EDP 26 mmHg.  Ao 118/62, mean 83 mmHg.  No pressure gradient across the aortic valve. LM: Large vessel, mild disease. LAD: Occluded in the midsegment.  Distal segment supplied by LIMA to LAD which is widely patent. LCx: Occluded in the proximal to mid segment.  OM1 is supplied by SVG to OM which is widely patent. RCA: Occluded in the midsegment  just proximal to previously placed stent.  SVG to RCA is patent, previously placed 3.5 x 38 mm resolute Onyx DES is widely patent in the distal RCA, proximal to mid 38 mm Taxus DES placed in May 2010 has a thrombotic 80% stenosis, focal.  There is mild luminal loss throughout the stent.   Intervention data: Successful filter wire protected aspiration thrombectomy followed by stenting of the proximal segment of the SVG to RCA with implantation of a 4.0 x 12 mm resolute frontier DES at 18 atmospheric pressure, post stenosis 0%.  TIMI-3 to TIMI-3  flow maintained.    Echocardiogram 10/15/2021:  Normal LV systolic function  with visual EF 55-60%. Left ventricle cavity  is normal in size. Normal left ventricular wall thickness. Normal global  wall motion. Abnormal septal wall motion due to post-operative septum.  Normal diastolic filling pattern, normal LAP.  Pericardium is normal. Insignificant pericardial effusion. There is no hemodynamic significance.  Compared to 11/06/2020 LVEF 35-40% and RWMA have now improved, otherwise no significant change.    Carotid artery duplex 04/24/2022: Duplex suggests stenosis in the right internal carotid artery (16-49%). Duplex suggests stenosis in the left internal carotid artery (16-49%). Compared to the study done on 10/15/2021, right ICA stenosis of >50% normal appears to have regressed. Follow up in one year is appropriate if clinically indicated. Risk Assessment/Calculations:             Physical Exam:   VS:  BP 122/70 (BP Location: Left Arm)   Pulse 72   Ht 5\' 8"  (1.727 m)   Wt 194 lb (88 kg)   SpO2 98%   BMI 29.50 kg/m    Wt Readings from Last 3 Encounters:  10/19/23 194 lb (88 kg)  09/30/23 198 lb (89.8 kg)  09/10/23 204 lb (92.5 kg)    GEN: Well nourished, well developed in no acute distress NECK: No JVD; No carotid bruits CARDIAC: RRR, II/VI systolic murmur RUSB, without rubs or gallops RESPIRATORY:  Clear to auscultation without rales, wheezing or rhonchi  ABDOMEN: Soft, non-tender, non-distended EXTREMITIES:  No edema; No deformity   ASSESSMENT AND PLAN: .   Coronary disease of native coronary arteries with stable angina.  Patient continues to have chronic chest discomfort that is unchanged.  He states he is feeling slightly better since his hernia surgery.  He recently underwent heart catheterization in February 2025 which showed no progression of his coronary artery disease.  He has been continued on clopidogrel  75 mg daily, rosuvastatin  40 mg daily, aspirin  was recently discontinued by his primary cardiologist.  EKG today reveals sinus rhythm with  sinus arrhythmia and occasional PVC with chronic right bundle branch block with a rate of 72.  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 He is continued on GDMT of Jardiance  10 mg daily and spironolactone  12.5 mg daily.  Previously was unable to tolerate further escalation of GDMT due to soft blood pressures and complaints of dizziness and lightheadedness.  He is euvolemic on exam.  Continues with NYHA class II symptoms.  Furosemide  has been changed to a as needed basis.  He has been encouraged to continue to weigh himself daily and limit his sodium intake.  Mixed hyperlipidemia with LDL has remained at goal.  He is continued on rosuvastatin  40 mg daily.  Bilateral carotid artery stenosis with last carotid  duplex in 2020 revealing right ICA stenosis of 60-49% stenosis in the left ICA 16-49% compared to study in 10/11/2021 with right ICA stenosis of greater than 50% appears to have regressed and it was recommended for follow-up in 1 year if appropriate and clinically indicated.  Patient decided to wait and have study completed at a later date.  Will discuss on return.  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  He is continued on Jardiance  10 mg daily and Aldactone  12.5 mg daily.  Furosemide  has been changed to as needed.  Kidney function and serum potassium have remained stable since initiation of spironolactone .  Potassium is on the high side of normal we will consider repeating a BMP on return.  Ongoing management of his diabetes by his PCP.  Status post hiatal hernia repair where he states that the hernia was pushing into his heart.  He states that he continues to have reduced appetite since surgery but overall does feel better than previous.  Ongoing management per his PCP and general  surgery.  Idiopathic pulmonary fibrosis for his chronic shortness of breath.  He had increased scarring in the lung bases noted on CT scans from a recent CT of abdomen.  He is scheduled for an updated CT of the chest by pulmonary.  He states that he has been told that his prognosis without treatment is typically 3 to 5 years.  And he is already a couple years into this condition.  Ongoing management per pulmonary       Dispo: Patient to return to clinic to see MD/APP in 3 months or sooner if needed for further evaluation  Signed, Annise Boran, NP

## 2023-10-19 NOTE — Patient Instructions (Signed)
 Medication Instructions:  Your Physician recommend you continue on your current medication as directed.    *If you need a refill on your cardiac medications before your next appointment, please call your pharmacy*  Lab Work: No labs ordered today  If you have labs (blood work) drawn today and your tests are completely normal, you will receive your results only by: MyChart Message (if you have MyChart) OR A paper copy in the mail If you have any lab test that is abnormal or we need to change your treatment, we will call you to review the results.  Testing/Procedures: No test ordered today   Follow-Up: At [facility], you and your health needs are our priority.  As part of our continuing mission to provide you with exceptional heart care, our providers are all part of one team.  This team includes your primary Cardiologist (physician) and Advanced Practice Providers or APPs (Physician Assistants and Nurse Practitioners) who all work together to provide you with the care you need, when you need it.  Your next appointment:   3 month(s)  Provider:   Ronald Cockayne, NP

## 2023-10-29 ENCOUNTER — Other Ambulatory Visit

## 2023-11-08 ENCOUNTER — Telehealth: Payer: Self-pay

## 2023-11-08 NOTE — Telephone Encounter (Signed)
 Prescription Request  11/08/2023  LOV: 09/10/23  What is the name of the medication or equipment? spironolactone  (ALDACTONE ) 25 MG tablet [[id number]]   Have you contacted your pharmacy to request a refill? Yes   Which pharmacy would you like this sent to?  [facility] - [place], [place] - [address] [place] [address] Phone: [phone] Fax: [phone]    Patient notified that their request is being sent to the clinical staff for review and that they should receive a response within 2 business days.   Please advise at [facility] [phone]

## 2023-11-16 ENCOUNTER — Other Ambulatory Visit: Payer: Self-pay

## 2023-11-16 MED ORDER — SPIRONOLACTONE 25 MG PO TABS: 12.5000 mg | ORAL_TABLET | Freq: Every day | ORAL | 3 refills | Status: DC

## 2023-12-08 ENCOUNTER — Ambulatory Visit: Payer: Self-pay

## 2023-12-08 NOTE — Telephone Encounter (Signed)
 FYI Only or Action Required?: FYI only for provider.  Patient was last seen in primary care on 09/10/2023 by [name], MD.  Called Nurse Triage reporting Pain.  Symptoms began x 3 days.  Interventions attempted: OTC medications: tylenol .  Symptoms are: unchanged.  Triage Disposition: See PCP When Office is Open (Within 3 Days)  Patient/caregiver understands and will follow disposition?: No, refuses disposition: pt stated he going to wait a few more days and if not better then will make appt.      Copied from CRM [id number]. Topic: Clinical - Red Word Triage >> Dec 08, 2023  3:30 PM Powell HERO wrote: Red Word that prompted transfer to Nurse Triage: Patient is having pain on the right side of his back next to his bottom, onset about 3 days ago. States pain is killing him Reason for Disposition  [1] MODERATE back pain (e.g., interferes with normal activities) AND [2] present > 3 days  Answer Assessment - Initial Assessment Questions 1. ONSET: When did the pain begin? (e.g., minutes, hours, days)     X 3 days 2. LOCATION: Where does it hurt? (upper, mid or lower back) lower  back 3. SEVERITY: How bad is the pain?  (e.g., Scale 1-10; mild, moderate, or severe)     10/10 comes and goes 4. PATTERN: Is the pain constant? (e.g., yes, no; constant, intermittent)      Comes and goes 5. RADIATION: Does the pain shoot into your legs or somewhere else?     no 6. CAUSE:  What do you think is causing the back pain?      unknown 7. BACK OVERUSE:  Any recent lifting of heavy objects, strenuous work or exercise?     no 8. MEDICINES: What have you taken so far for the pain? (e.g., nothing, acetaminophen , NSAIDS)     tylenol  9. NEUROLOGIC SYMPTOMS: Do you have any weakness, numbness, or problems with bowel/bladder control?     Numbness to bilateral feet: this is ongoing  10. OTHER SYMPTOMS: Do you have any other symptoms? (e.g., fever, abdomen pain, burning with  urination, blood in urine)       no 11. PREGNANCY: Is there any chance you are pregnant? When was your last menstrual period?       na  Protocols used: Back Pain-A-AH

## 2024-01-04 ENCOUNTER — Ambulatory Visit: Admitting: Pulmonary Disease

## 2024-01-04 ENCOUNTER — Encounter

## 2024-01-12 ENCOUNTER — Other Ambulatory Visit: Payer: Self-pay | Admitting: Family Medicine

## 2024-01-12 ENCOUNTER — Telehealth: Payer: Self-pay | Admitting: Family Medicine

## 2024-01-12 NOTE — Telephone Encounter (Unsigned)
 Copied from CRM #[id number]. Topic: Clinical - Medication Refill >> Jan 12, 2024  4:36 PM Kevelyn M wrote: Medication: traZODone  (DESYREL ) 50 MG tablet   Has the patient contacted their pharmacy? Yes (Agent: If no, request that the patient contact the pharmacy for the refill. If patient does not wish to contact the pharmacy document the reason why and proceed with request.) (Agent: If yes, when and what did the pharmacy advise?)  This is the patient's preferred pharmacy:  [facility] - [place], [place] - [address] [place] [address] Phone: [phone] Fax: [phone]  Is this the correct pharmacy for this prescription? Yes If no, delete pharmacy and type the correct one.   Has the prescription been filled recently? No  Is the patient out of the medication? Yes  Has the patient been seen for an appointment in the last year OR does the patient have an upcoming appointment? Yes  Can we respond through MyChart? No  Agent: Please be advised that Rx refills may take up to 3 business days. We ask that you follow-up with your pharmacy.

## 2024-01-12 NOTE — Telephone Encounter (Unsigned)
 Copied from CRM #[id number]. Topic: Clinical - Medication Refill >> Jan 12, 2024  4:40 PM Kevelyn M wrote: Medication: traZODone  (DESYREL ) 50 MG tablet  Has the patient contacted their pharmacy? Yes (Agent: If no, request that the patient contact the pharmacy for the refill. If patient does not wish to contact the pharmacy document the reason why and proceed with request.) (Agent: If yes, when and what did the pharmacy advise?)  This is the patient's preferred pharmacy:  [facility] [address], [place], [place] [address] [phone]  Is this the correct pharmacy for this prescription? Yes If no, delete pharmacy and type the correct one.   Has the prescription been filled recently? No  Is the patient out of the medication? Yes  Has the patient been seen for an appointment in the last year OR does the patient have an upcoming appointment? Yes  Can we respond through MyChart? No  Agent: Please be advised that Rx refills may take up to 3 business days. We ask that you follow-up with your pharmacy.

## 2024-01-13 MED ORDER — TRAZODONE HCL 50 MG PO TABS: 50.0000 mg | ORAL_TABLET | Freq: Every evening | ORAL | 0 refills | Status: DC | PRN

## 2024-01-13 NOTE — Telephone Encounter (Signed)
 Requested Prescriptions  Pending Prescriptions Disp Refills   traZODone  (DESYREL ) 50 MG tablet 30 tablet 0    Sig: Take 1 tablet (50 mg total) by mouth at bedtime as needed for sleep.     Psychiatry: Antidepressants - Serotonin Modulator Passed - 01/13/2024  4:13 PM      Passed - Valid encounter within last 6 months    Recent Outpatient Visits           4 months ago Chronic combined systolic and diastolic CHF (congestive heart failure) (HCC)   [place] [facility] [name], MD   5 months ago Type 2 diabetes mellitus with other circulatory complication, without long-term current use of insulin  [facility])   [place] [facility] [name], MD   6 months ago Insomnia, unspecified type   [place] [facility] Kayla Jeoffrey RAMAN, FNP   10 months ago Acute gout of left knee, unspecified cause    [place] [facility] [name], MD   11 months ago Unintentional weight loss    [facility] [name], Butler DASEN, MD

## 2024-01-19 ENCOUNTER — Other Ambulatory Visit (HOSPITAL_BASED_OUTPATIENT_CLINIC_OR_DEPARTMENT_OTHER)

## 2024-01-19 ENCOUNTER — Ambulatory Visit: Admitting: Cardiology

## 2024-02-01 ENCOUNTER — Emergency Department (HOSPITAL_COMMUNITY)

## 2024-02-01 ENCOUNTER — Inpatient Hospital Stay (HOSPITAL_COMMUNITY)
Admission: EM | Admit: 2024-02-01 | Discharge: 2024-02-04 | DRG: 389 | Disposition: A | Discharge status: Home / Self Care | Attending: Family Medicine | Admitting: Family Medicine

## 2024-02-01 DIAGNOSIS — Z96662 Presence of left artificial ankle joint: Secondary | ICD-10-CM | POA: Diagnosis present

## 2024-02-01 DIAGNOSIS — K469 Unspecified abdominal hernia without obstruction or gangrene: Secondary | ICD-10-CM

## 2024-02-01 DIAGNOSIS — Z79899 Other long term (current) drug therapy: Secondary | ICD-10-CM

## 2024-02-01 DIAGNOSIS — I5022 Chronic systolic (congestive) heart failure: Secondary | ICD-10-CM | POA: Diagnosis present

## 2024-02-01 DIAGNOSIS — Z888 Allergy status to other drugs, medicaments and biological substances status: Secondary | ICD-10-CM | POA: Diagnosis not present

## 2024-02-01 DIAGNOSIS — K5730 Diverticulosis of large intestine without perforation or abscess without bleeding: Secondary | ICD-10-CM | POA: Diagnosis not present

## 2024-02-01 DIAGNOSIS — K529 Noninfective gastroenteritis and colitis, unspecified: Secondary | ICD-10-CM | POA: Diagnosis present

## 2024-02-01 DIAGNOSIS — D631 Anemia in chronic kidney disease: Secondary | ICD-10-CM | POA: Diagnosis present

## 2024-02-01 DIAGNOSIS — Z87891 Personal history of nicotine dependence: Secondary | ICD-10-CM

## 2024-02-01 DIAGNOSIS — R1084 Generalized abdominal pain: Secondary | ICD-10-CM | POA: Diagnosis not present

## 2024-02-01 DIAGNOSIS — K449 Diaphragmatic hernia without obstruction or gangrene: Secondary | ICD-10-CM | POA: Diagnosis not present

## 2024-02-01 DIAGNOSIS — R140 Abdominal distension (gaseous): Secondary | ICD-10-CM | POA: Diagnosis not present

## 2024-02-01 DIAGNOSIS — Z7984 Long term (current) use of oral hypoglycemic drugs: Secondary | ICD-10-CM

## 2024-02-01 DIAGNOSIS — K567 Ileus, unspecified: Principal | ICD-10-CM | POA: Diagnosis present

## 2024-02-01 DIAGNOSIS — I25110 Atherosclerotic heart disease of native coronary artery with unstable angina pectoris: Secondary | ICD-10-CM

## 2024-02-01 DIAGNOSIS — I2510 Atherosclerotic heart disease of native coronary artery without angina pectoris: Secondary | ICD-10-CM | POA: Diagnosis present

## 2024-02-01 DIAGNOSIS — Z951 Presence of aortocoronary bypass graft: Secondary | ICD-10-CM

## 2024-02-01 DIAGNOSIS — Z7902 Long term (current) use of antithrombotics/antiplatelets: Secondary | ICD-10-CM

## 2024-02-01 DIAGNOSIS — E785 Hyperlipidemia, unspecified: Secondary | ICD-10-CM | POA: Diagnosis present

## 2024-02-01 DIAGNOSIS — K219 Gastro-esophageal reflux disease without esophagitis: Secondary | ICD-10-CM | POA: Diagnosis present

## 2024-02-01 DIAGNOSIS — I214 Non-ST elevation (NSTEMI) myocardial infarction: Secondary | ICD-10-CM

## 2024-02-01 DIAGNOSIS — E119 Type 2 diabetes mellitus without complications: Secondary | ICD-10-CM

## 2024-02-01 DIAGNOSIS — Z4682 Encounter for fitting and adjustment of non-vascular catheter: Secondary | ICD-10-CM | POA: Diagnosis not present

## 2024-02-01 DIAGNOSIS — Z8673 Personal history of transient ischemic attack (TIA), and cerebral infarction without residual deficits: Secondary | ICD-10-CM

## 2024-02-01 DIAGNOSIS — Z823 Family history of stroke: Secondary | ICD-10-CM

## 2024-02-01 DIAGNOSIS — N281 Cyst of kidney, acquired: Secondary | ICD-10-CM | POA: Diagnosis not present

## 2024-02-01 DIAGNOSIS — I10 Essential (primary) hypertension: Secondary | ICD-10-CM | POA: Diagnosis not present

## 2024-02-01 DIAGNOSIS — I130 Hypertensive heart and chronic kidney disease with heart failure and stage 1 through stage 4 chronic kidney disease, or unspecified chronic kidney disease: Secondary | ICD-10-CM | POA: Diagnosis present

## 2024-02-01 DIAGNOSIS — K6389 Other specified diseases of intestine: Secondary | ICD-10-CM | POA: Diagnosis not present

## 2024-02-01 DIAGNOSIS — E1122 Type 2 diabetes mellitus with diabetic chronic kidney disease: Secondary | ICD-10-CM | POA: Diagnosis present

## 2024-02-01 DIAGNOSIS — I6521 Occlusion and stenosis of right carotid artery: Secondary | ICD-10-CM

## 2024-02-01 DIAGNOSIS — E1165 Type 2 diabetes mellitus with hyperglycemia: Secondary | ICD-10-CM | POA: Diagnosis present

## 2024-02-01 DIAGNOSIS — R0989 Other specified symptoms and signs involving the circulatory and respiratory systems: Secondary | ICD-10-CM | POA: Diagnosis not present

## 2024-02-01 DIAGNOSIS — N182 Chronic kidney disease, stage 2 (mild): Secondary | ICD-10-CM | POA: Diagnosis present

## 2024-02-01 DIAGNOSIS — J8410 Pulmonary fibrosis, unspecified: Secondary | ICD-10-CM | POA: Diagnosis present

## 2024-02-01 DIAGNOSIS — E1159 Type 2 diabetes mellitus with other circulatory complications: Secondary | ICD-10-CM | POA: Diagnosis not present

## 2024-02-01 DIAGNOSIS — Z8249 Family history of ischemic heart disease and other diseases of the circulatory system: Secondary | ICD-10-CM

## 2024-02-01 DIAGNOSIS — I255 Ischemic cardiomyopathy: Secondary | ICD-10-CM | POA: Diagnosis present

## 2024-02-01 DIAGNOSIS — I5020 Unspecified systolic (congestive) heart failure: Secondary | ICD-10-CM | POA: Diagnosis not present

## 2024-02-01 DIAGNOSIS — R918 Other nonspecific abnormal finding of lung field: Secondary | ICD-10-CM | POA: Diagnosis not present

## 2024-02-01 DIAGNOSIS — R110 Nausea: Secondary | ICD-10-CM | POA: Diagnosis not present

## 2024-02-01 DIAGNOSIS — I252 Old myocardial infarction: Secondary | ICD-10-CM | POA: Diagnosis not present

## 2024-02-01 DIAGNOSIS — R000 Tachycardia, unspecified: Secondary | ICD-10-CM | POA: Diagnosis not present

## 2024-02-01 LAB — LIPASE, BLOOD: Lipase: 40 U/L (ref 11–51)

## 2024-02-01 LAB — COMPREHENSIVE METABOLIC PANEL WITH GFR
ALT: 9 U/L (ref 0–44)
AST: 22 U/L (ref 15–41)
Albumin: 4.2 g/dL (ref 3.5–5.0)
Alkaline Phosphatase: 57 U/L (ref 38–126)
Anion gap: 15 (ref 5–15)
BUN: 12 mg/dL (ref 8–23)
CO2: 21 mmol/L — ABNORMAL LOW (ref 22–32)
Calcium: 9.3 mg/dL (ref 8.9–10.3)
Chloride: 106 mmol/L (ref 98–111)
Creatinine, Ser: 1.19 mg/dL (ref 0.61–1.24)
GFR, Estimated: >60 mL/min (ref >60)
Glucose, Bld: 117 mg/dL — ABNORMAL HIGH (ref 70–99)
Potassium: 3.7 mmol/L (ref 3.5–5.1)
Sodium: 142 mmol/L (ref 135–145)
Total Bilirubin: 0.3 mg/dL (ref 0.0–1.2)
Total Protein: 8.1 g/dL (ref 6.5–8.1)

## 2024-02-01 LAB — CBC WITH DIFFERENTIAL/PLATELET
Abs Immature Granulocytes: 0.04 K/uL (ref 0.00–0.07)
Basophils Absolute: 0.1 K/uL (ref 0.0–0.1)
Basophils Relative: 1 %
Eosinophils Absolute: 0.1 K/uL (ref 0.0–0.5)
Eosinophils Relative: 1 %
HCT: 40.2 % (ref 39.0–52.0)
Hemoglobin: 11.1 g/dL — ABNORMAL LOW (ref 13.0–17.0)
Immature Granulocytes: 0 %
Lymphocytes Relative: 14 %
Lymphs Abs: 1.4 K/uL (ref 0.7–4.0)
MCH: 20.7 pg — ABNORMAL LOW (ref 26.0–34.0)
MCHC: 27.6 g/dL — ABNORMAL LOW (ref 30.0–36.0)
MCV: 74.9 fL — ABNORMAL LOW (ref 80.0–100.0)
Monocytes Absolute: 0.6 K/uL (ref 0.1–1.0)
Monocytes Relative: 6 %
Neutro Abs: 8 K/uL — ABNORMAL HIGH (ref 1.7–7.7)
Neutrophils Relative %: 78 %
Platelets: 324 K/uL (ref 150–400)
RBC: 5.37 MIL/uL (ref 4.22–5.81)
RDW: 20 % — ABNORMAL HIGH (ref 11.5–15.5)
WBC: 10.2 K/uL (ref 4.0–10.5)
nRBC: 0 % (ref 0.0–0.2)

## 2024-02-01 LAB — I-STAT CHEM 8, ED
BUN: 13 mg/dL (ref 8–23)
Calcium, Ion: 1.17 mmol/L (ref 1.15–1.40)
Chloride: 106 mmol/L (ref 98–111)
Creatinine, Ser: 1.3 mg/dL — ABNORMAL HIGH (ref 0.61–1.24)
Glucose, Bld: 120 mg/dL — ABNORMAL HIGH (ref 70–99)
HCT: 39 % (ref 39.0–52.0)
Hemoglobin: 13.3 g/dL (ref 13.0–17.0)
Potassium: 3.6 mmol/L (ref 3.5–5.1)
Sodium: 143 mmol/L (ref 135–145)
TCO2: 24 mmol/L (ref 22–32)

## 2024-02-01 LAB — TROPONIN T, HIGH SENSITIVITY: Troponin T High Sensitivity: 18 ng/L (ref 0–19)

## 2024-02-01 MED ORDER — HYDROMORPHONE HCL 1 MG/ML IJ SOLN
1.0000 mg | Freq: Once | INTRAMUSCULAR | Status: AC
  Administered 2024-02-01: 1 mg via INTRAVENOUS
  Filled 2024-02-01: qty 1

## 2024-02-01 MED ORDER — IOHEXOL 300 MG/ML  SOLN
100.0000 mL | Freq: Once | INTRAMUSCULAR | Status: AC | PRN
  Administered 2024-02-01: 100 mL via INTRAVENOUS

## 2024-02-01 MED ORDER — ONDANSETRON HCL 4 MG/2ML IJ SOLN
4.0000 mg | Freq: Once | INTRAMUSCULAR | Status: AC
  Administered 2024-02-01: 4 mg via INTRAVENOUS
  Filled 2024-02-01: qty 2

## 2024-02-01 MED ORDER — LACTATED RINGERS IV BOLUS
500.0000 mL | Freq: Once | INTRAVENOUS | Status: AC
  Administered 2024-02-01: 500 mL via INTRAVENOUS

## 2024-02-01 NOTE — ED Notes (Signed)
 Patient transported to CT

## 2024-02-01 NOTE — ED Triage Notes (Addendum)
 BIB EMS for severe abd pain and distention after using the restroom. Previous surgical history. Given 200mg  of Fentanyl  in route IV without relieif. In ER - Patient dropped to 79% on room air and sustained for extended period of time and came back up to 90% without intervention. Abdominal distention and firm. Patient intermittently wailing in pain.

## 2024-02-01 NOTE — ED Provider Notes (Signed)
 [place] EMERGENCY DEPARTMENT AT [facility] Provider Note   CSN: [id number] Arrival date & time: 02/01/24  1714     Patient presents with: Abdominal Pain   Chris [name]. is a 83 y.o. male.  {Add pertinent medical, surgical, social history, OB history to HPI:32947}  Abdominal Pain    Patient has a history of coronary artery disease, hypertension, renal insufficiency, stroke, hyperlipidemia, diabetes, non-ST elevation MI, CHF.  Patient does have history of an incarcerated hiatal hernia as well as umbilical hernia that required surgical intervention.  Patient presents today with complaints of severe onset of abdominal pain associated with nausea.  Patient states the pain is throughout his abdomen.  He feels very bloated and nauseated.  Patient feels like he has to vomit.  He also feels like he is not able to pass gas.  Prior to Admission medications   Medication Sig Start Date End Date Taking? Authorizing Provider  acetaminophen  (TYLENOL ) 500 MG tablet Take 500 mg by mouth every 6 (six) hours as needed for mild pain.    [provider]  Blood Glucose Monitoring Suppl DEVI 1 each by Does not apply route in the morning, at noon, and at bedtime. May substitute to any manufacturer covered by patient's insurance. 11/16/22   [name], MD  clopidogrel  (PLAVIX ) 75 MG tablet TAKE 1 TABLET EVERY DAY 09/13/23   [name], MD  cyanocobalamin  (VITAMIN B12) 1000 MCG tablet Take 1 tablet (1,000 mcg total) by mouth daily. 07/20/23 07/14/24  [name], MD  empagliflozin  (JARDIANCE ) 10 MG TABS tablet Take 1 tablet (10 mg total) by mouth daily before breakfast. 09/10/23   [name], MD  furosemide  (LASIX ) 20 MG tablet Take 20 mg by mouth as needed for fluid. 09/14/23   [provider]  nitroGLYCERIN  (NITROSTAT ) 0.4 MG SL tablet Place 1 tablet (0.4 mg total) under the tongue every 5 (five) minutes as needed for chest pain. 04/06/22   [name], MD  oxyCODONE  (OXY IR/ROXICODONE ) 5 MG immediate release tablet Take 1 tablet (5 mg total) by mouth every 6 (six) hours as needed (pain). 09/03/23   Tammy Sor, PA-C  pantoprazole  (PROTONIX ) 40 MG tablet Take 1 tablet (40 mg total) by mouth daily. 09/03/23   Jillian Buttery, MD  rosuvastatin  (CRESTOR ) 40 MG tablet TAKE 1 TABLET EVERY DAY 06/10/23   Ladona Heinz, MD  spironolactone  (ALDACTONE ) 25 MG tablet Take 0.5 tablets (12.5 mg total) by mouth daily. 11/16/23 02/14/24  [name], MD  sucralfate  (CARAFATE ) 1 g tablet TAKE 1 TABLET BY MOUTH FOUR TIMES DAILY WITH MEALS AND AT BEDTIME 05/12/23   [name], MD  traZODone  (DESYREL ) 50 MG tablet Take 1 tablet (50 mg total) by mouth at bedtime as needed for sleep. 01/13/24   [name], MD    Allergies: Brilinta  [ticagrelor ], Alpha-gal, Crestor  [rosuvastatin  calcium ], and Lipitor [atorvastatin ]    Review of Systems  Gastrointestinal:  Positive for abdominal pain.    Updated Vital Signs BP (!) 162/89   Pulse 89   Temp 97.8 F (36.6 C) (Oral)   Resp 17   SpO2 97%   Physical Exam Vitals and nursing note reviewed.  Constitutional:      Appearance: He is well-developed. He is ill-appearing. He is not diaphoretic.  HENT:     Head: Normocephalic and atraumatic.     Right Ear: External ear normal.     Left Ear: External ear normal.  Eyes:  General: No scleral icterus.       Right eye: No discharge.        Left eye: No discharge.     Conjunctiva/sclera: Conjunctivae normal.  Neck:     Trachea: No tracheal deviation.  Cardiovascular:     Rate and Rhythm: Normal rate and regular rhythm.  Pulmonary:     Effort: Pulmonary effort is normal. No respiratory distress.     Breath sounds: Normal breath sounds. No stridor. No wheezing or rales.  Abdominal:     General: Abdomen is protuberant. Bowel sounds are increased. There is distension.     Palpations: Abdomen is soft.     Tenderness: There is generalized abdominal  tenderness. There is guarding. There is no rebound.  Musculoskeletal:        General: No tenderness or deformity.     Cervical back: Neck supple.  Skin:    General: Skin is warm and dry.     Findings: No rash.  Neurological:     General: No focal deficit present.     Mental Status: He is alert.     Cranial Nerves: No cranial nerve deficit, dysarthria or facial asymmetry.     Sensory: No sensory deficit.     Motor: No abnormal muscle tone or seizure activity.     Coordination: Coordination normal.  Psychiatric:        Mood and Affect: Mood normal.     (all labs ordered are listed, but only abnormal results are displayed) Labs Reviewed - No data to display  EKG: None  Radiology: No results found.  {Document cardiac monitor, telemetry assessment procedure when appropriate:32947} Procedures   Medications Ordered in the ED  HYDROmorphone  (DILAUDID ) injection 1 mg (has no administration in time range)  ondansetron  (ZOFRAN ) injection 4 mg (has no administration in time range)  lactated ringers  bolus 500 mL (has no administration in time range)    Clinical Course as of 02/01/24 2343  Tue Feb 01, 2024  2014 CBC with Diff(!) CBC shows mild anemia.  Metabolic panel no significant abnormalities. [JK]  2014 Acute abdominal series shows air distention of the colon with multiple fluid levels suggestive of possible colonic ileus versus low distal obstruction [JK]  2100 Patient still nauseated.  Will proceed with NG tube [JK]  2221 No evidence of bowel obstruction on CT scan.  Patient has mild pericolonic stranding and fluid primarily around the hepatic flexure.  Possible mild colitis.  Mildly dilated colon suggestive of ileus [JK]  2254 Case discussed with Dr Charlton regarding admission [JK]    Clinical Course User Index [JK] Randol Simmonds, MD   {Click here for ABCD2, HEART and other calculators REFRESH Note before signing:1}                              Medical Decision  Making Differential diagnosis includes but not limited to abdominal aortic aneurysm, bowel obstruction, incarcerated ventral hernia, hepatitis cholecystitis, pancreatitis  Amount and/or Complexity of Data Reviewed Labs: ordered. Decision-making details documented in ED Course. Radiology: ordered.  Risk Prescription drug management. Decision regarding hospitalization.   ***  {Document critical care time when appropriate  Document review of labs and clinical decision tools ie CHADS2VASC2, etc  Document your independent review of radiology images and any outside records  Document your discussion with family members, caretakers and with consultants  Document social determinants of health affecting pt's care  Document your decision making why or  why not admission, treatments were needed:32947:::1}   Final diagnoses:  None    ED Discharge Orders     None

## 2024-02-02 ENCOUNTER — Encounter (HOSPITAL_COMMUNITY): Payer: Self-pay | Admitting: Family Medicine

## 2024-02-02 ENCOUNTER — Other Ambulatory Visit: Payer: Self-pay

## 2024-02-02 DIAGNOSIS — Z888 Allergy status to other drugs, medicaments and biological substances status: Secondary | ICD-10-CM | POA: Diagnosis not present

## 2024-02-02 DIAGNOSIS — K529 Noninfective gastroenteritis and colitis, unspecified: Secondary | ICD-10-CM | POA: Diagnosis present

## 2024-02-02 DIAGNOSIS — E1159 Type 2 diabetes mellitus with other circulatory complications: Secondary | ICD-10-CM | POA: Diagnosis not present

## 2024-02-02 DIAGNOSIS — Z96662 Presence of left artificial ankle joint: Secondary | ICD-10-CM | POA: Diagnosis present

## 2024-02-02 DIAGNOSIS — K567 Ileus, unspecified: Secondary | ICD-10-CM

## 2024-02-02 DIAGNOSIS — K219 Gastro-esophageal reflux disease without esophagitis: Secondary | ICD-10-CM | POA: Diagnosis present

## 2024-02-02 DIAGNOSIS — Z7902 Long term (current) use of antithrombotics/antiplatelets: Secondary | ICD-10-CM | POA: Diagnosis not present

## 2024-02-02 DIAGNOSIS — J8410 Pulmonary fibrosis, unspecified: Secondary | ICD-10-CM

## 2024-02-02 DIAGNOSIS — Z87891 Personal history of nicotine dependence: Secondary | ICD-10-CM | POA: Diagnosis not present

## 2024-02-02 DIAGNOSIS — I5020 Unspecified systolic (congestive) heart failure: Secondary | ICD-10-CM

## 2024-02-02 DIAGNOSIS — D631 Anemia in chronic kidney disease: Secondary | ICD-10-CM | POA: Diagnosis present

## 2024-02-02 DIAGNOSIS — I255 Ischemic cardiomyopathy: Secondary | ICD-10-CM | POA: Diagnosis present

## 2024-02-02 DIAGNOSIS — E1122 Type 2 diabetes mellitus with diabetic chronic kidney disease: Secondary | ICD-10-CM | POA: Diagnosis present

## 2024-02-02 DIAGNOSIS — Z951 Presence of aortocoronary bypass graft: Secondary | ICD-10-CM | POA: Diagnosis not present

## 2024-02-02 DIAGNOSIS — I25110 Atherosclerotic heart disease of native coronary artery with unstable angina pectoris: Secondary | ICD-10-CM | POA: Diagnosis not present

## 2024-02-02 DIAGNOSIS — I130 Hypertensive heart and chronic kidney disease with heart failure and stage 1 through stage 4 chronic kidney disease, or unspecified chronic kidney disease: Secondary | ICD-10-CM | POA: Diagnosis present

## 2024-02-02 DIAGNOSIS — I5022 Chronic systolic (congestive) heart failure: Secondary | ICD-10-CM | POA: Diagnosis present

## 2024-02-02 DIAGNOSIS — I252 Old myocardial infarction: Secondary | ICD-10-CM | POA: Diagnosis not present

## 2024-02-02 DIAGNOSIS — Z8249 Family history of ischemic heart disease and other diseases of the circulatory system: Secondary | ICD-10-CM | POA: Diagnosis not present

## 2024-02-02 DIAGNOSIS — E1165 Type 2 diabetes mellitus with hyperglycemia: Secondary | ICD-10-CM | POA: Diagnosis present

## 2024-02-02 DIAGNOSIS — Z8673 Personal history of transient ischemic attack (TIA), and cerebral infarction without residual deficits: Secondary | ICD-10-CM | POA: Diagnosis not present

## 2024-02-02 DIAGNOSIS — Z79899 Other long term (current) drug therapy: Secondary | ICD-10-CM | POA: Diagnosis not present

## 2024-02-02 DIAGNOSIS — Z7984 Long term (current) use of oral hypoglycemic drugs: Secondary | ICD-10-CM | POA: Diagnosis not present

## 2024-02-02 DIAGNOSIS — I2510 Atherosclerotic heart disease of native coronary artery without angina pectoris: Secondary | ICD-10-CM | POA: Diagnosis present

## 2024-02-02 DIAGNOSIS — N182 Chronic kidney disease, stage 2 (mild): Secondary | ICD-10-CM | POA: Diagnosis present

## 2024-02-02 DIAGNOSIS — E785 Hyperlipidemia, unspecified: Secondary | ICD-10-CM | POA: Diagnosis present

## 2024-02-02 DIAGNOSIS — Z823 Family history of stroke: Secondary | ICD-10-CM | POA: Diagnosis not present

## 2024-02-02 LAB — CBC
HCT: 43.1 % (ref 39.0–52.0)
Hemoglobin: 11.2 g/dL — ABNORMAL LOW (ref 13.0–17.0)
MCH: 21.1 pg — ABNORMAL LOW (ref 26.0–34.0)
MCHC: 26 g/dL — ABNORMAL LOW (ref 30.0–36.0)
MCV: 81.3 fL (ref 80.0–100.0)
Platelets: 284 K/uL (ref 150–400)
RBC: 5.3 MIL/uL (ref 4.22–5.81)
RDW: 20.7 % — ABNORMAL HIGH (ref 11.5–15.5)
WBC: 18.6 K/uL — ABNORMAL HIGH (ref 4.0–10.5)
nRBC: 0 % (ref 0.0–0.2)

## 2024-02-02 LAB — COMPREHENSIVE METABOLIC PANEL WITH GFR
ALT: 10 U/L (ref 0–44)
AST: 29 U/L (ref 15–41)
Albumin: 4.1 g/dL (ref 3.5–5.0)
Alkaline Phosphatase: 58 U/L (ref 38–126)
Anion gap: 17 — ABNORMAL HIGH (ref 5–15)
BUN: 11 mg/dL (ref 8–23)
CO2: 21 mmol/L — ABNORMAL LOW (ref 22–32)
Calcium: 9.2 mg/dL (ref 8.9–10.3)
Chloride: 104 mmol/L (ref 98–111)
Creatinine, Ser: 1.19 mg/dL (ref 0.61–1.24)
GFR, Estimated: >60 mL/min (ref >60)
Glucose, Bld: 124 mg/dL — ABNORMAL HIGH (ref 70–99)
Potassium: 4.8 mmol/L (ref 3.5–5.1)
Sodium: 141 mmol/L (ref 135–145)
Total Bilirubin: 0.5 mg/dL (ref 0.0–1.2)
Total Protein: 8 g/dL (ref 6.5–8.1)

## 2024-02-02 LAB — GLUCOSE, CAPILLARY
Glucose-Capillary: 115 mg/dL — ABNORMAL HIGH (ref 70–99)
Glucose-Capillary: 123 mg/dL — ABNORMAL HIGH (ref 70–99)
Glucose-Capillary: 130 mg/dL — ABNORMAL HIGH (ref 70–99)
Glucose-Capillary: 130 mg/dL — ABNORMAL HIGH (ref 70–99)
Glucose-Capillary: 138 mg/dL — ABNORMAL HIGH (ref 70–99)
Glucose-Capillary: 143 mg/dL — ABNORMAL HIGH (ref 70–99)

## 2024-02-02 LAB — HEMOGLOBIN A1C
Hgb A1c MFr Bld: 6.6 % — ABNORMAL HIGH (ref 4.8–5.6)
Mean Plasma Glucose: 142.72 mg/dL

## 2024-02-02 MED ORDER — OXYCODONE HCL 5 MG PO TABS
5.0000 mg | ORAL_TABLET | ORAL | Status: DC | PRN
  Administered 2024-02-02 – 2024-02-03 (×5): 5 mg via ORAL
  Filled 2024-02-02 (×6): qty 1

## 2024-02-02 MED ORDER — TRIMETHOBENZAMIDE HCL 100 MG/ML IM SOLN
200.0000 mg | Freq: Four times a day (QID) | INTRAMUSCULAR | Status: DC | PRN
Start: 2024-02-02 — End: 2024-02-04

## 2024-02-02 MED ORDER — ACETAMINOPHEN 325 MG PO TABS
650.0000 mg | ORAL_TABLET | Freq: Four times a day (QID) | ORAL | Status: DC | PRN
  Administered 2024-02-03: 650 mg via ORAL
  Filled 2024-02-02: qty 2

## 2024-02-02 MED ORDER — PROCHLORPERAZINE EDISYLATE 10 MG/2ML IJ SOLN: 5.0000 mg | Freq: Four times a day (QID) | INTRAMUSCULAR | Status: DC | PRN

## 2024-02-02 MED ORDER — SODIUM CHLORIDE 0.9 % IV SOLN
2.0000 g | INTRAVENOUS | Status: DC
  Administered 2024-02-02 – 2024-02-04 (×3): 2 g via INTRAVENOUS
  Filled 2024-02-02 (×3): qty 20

## 2024-02-02 MED ORDER — ACETAMINOPHEN 650 MG RE SUPP: 650.0000 mg | Freq: Four times a day (QID) | RECTAL | Status: DC | PRN

## 2024-02-02 MED ORDER — TRAZODONE HCL 100 MG PO TABS: 50.0000 mg | ORAL_TABLET | Freq: Every evening | ORAL | Status: DC | PRN

## 2024-02-02 MED ORDER — SODIUM CHLORIDE 0.9 % IV SOLN: INTRAVENOUS | Status: AC

## 2024-02-02 MED ORDER — CLOPIDOGREL BISULFATE 75 MG PO TABS
75.0000 mg | ORAL_TABLET | Freq: Every day | ORAL | Status: DC
Start: 2024-02-02 — End: 2024-02-04
  Administered 2024-02-02 – 2024-02-04 (×3): 75 mg via ORAL
  Filled 2024-02-02 (×3): qty 1

## 2024-02-02 MED ORDER — FENTANYL CITRATE PF 50 MCG/ML IJ SOSY
12.5000 ug | PREFILLED_SYRINGE | INTRAMUSCULAR | Status: DC | PRN
  Administered 2024-02-02 (×3): 50 ug via INTRAVENOUS
  Filled 2024-02-02 (×3): qty 1

## 2024-02-02 MED ORDER — INSULIN ASPART 100 UNIT/ML IJ SOLN: 0.0000 [IU] | INTRAMUSCULAR | Status: DC

## 2024-02-02 MED ORDER — PANTOPRAZOLE SODIUM 40 MG PO TBEC
40.0000 mg | DELAYED_RELEASE_TABLET | Freq: Every day | ORAL | Status: DC
Start: 2024-02-02 — End: 2024-02-04
  Administered 2024-02-02 – 2024-02-04 (×3): 40 mg via ORAL
  Filled 2024-02-02 (×3): qty 1

## 2024-02-02 MED ORDER — METRONIDAZOLE 500 MG/100ML IV SOLN
500.0000 mg | Freq: Two times a day (BID) | INTRAVENOUS | Status: DC
  Administered 2024-02-02 – 2024-02-04 (×6): 500 mg via INTRAVENOUS
  Filled 2024-02-02 (×6): qty 100

## 2024-02-02 MED ORDER — ROSUVASTATIN CALCIUM 20 MG PO TABS
40.0000 mg | ORAL_TABLET | Freq: Every day | ORAL | Status: DC
Start: 2024-02-02 — End: 2024-02-04
  Administered 2024-02-02 – 2024-02-04 (×3): 40 mg via ORAL
  Filled 2024-02-02 (×3): qty 2

## 2024-02-02 NOTE — Plan of Care (Signed)
  Problem: Education: Goal: Knowledge of General Education information will improve Description: Including pain rating scale, medication(s)/side effects and non-pharmacologic comfort measures Outcome: Progressing   Problem: Health Behavior/Discharge Planning: Goal: Ability to manage health-related needs will improve Outcome: Progressing   Problem: Clinical Measurements: Goal: Ability to maintain clinical measurements within normal limits will improve Outcome: Progressing Goal: Will remain free from infection Outcome: Progressing Goal: Diagnostic test results will improve Outcome: Progressing Goal: Respiratory complications will improve Outcome: Progressing Goal: Cardiovascular complication will be avoided Outcome: Progressing   Problem: Activity: Goal: Risk for activity intolerance will decrease Outcome: Progressing   Problem: Nutrition: Goal: Adequate nutrition will be maintained Outcome: Progressing   Problem: Coping: Goal: Level of anxiety will decrease Outcome: Progressing   Problem: Elimination: Goal: Will not experience complications related to bowel motility Outcome: Progressing Goal: Will not experience complications related to urinary retention Outcome: Completed/Met   Problem: Pain Managment: Goal: General experience of comfort will improve and/or be controlled Outcome: Progressing   Problem: Safety: Goal: Ability to remain free from injury will improve Outcome: Progressing   Problem: Skin Integrity: Goal: Risk for impaired skin integrity will decrease Outcome: Progressing   Problem: Fluid Volume: Goal: Ability to maintain a balanced intake and output will improve Outcome: Progressing   Problem: Metabolic: Goal: Ability to maintain appropriate glucose levels will improve Outcome: Progressing   Problem: Skin Integrity: Goal: Risk for impaired skin integrity will decrease Outcome: Progressing   Problem: Tissue Perfusion: Goal: Adequacy of tissue  perfusion will improve Outcome: Progressing

## 2024-02-02 NOTE — Plan of Care (Signed)
  Problem: Education: Goal: Knowledge of General Education information will improve Description: Including pain rating scale, medication(s)/side effects and non-pharmacologic comfort measures Outcome: Progressing   Problem: Pain Managment: Goal: General experience of comfort will improve and/or be controlled Outcome: Progressing

## 2024-02-02 NOTE — Progress Notes (Addendum)
 PROGRESS NOTE  Chris [name], is a 83 y.o. male, DOB - [birth year], FMW:[id number]  Admit date - 02/01/2024   Admitting Physician Mysty Kielty Pearlean, MD  Outpatient Primary MD for the patient is Chris Butler DASEN, MD  LOS - 0  Chief Complaint  Patient presents with   Abdominal Pain      Brief Narrative:  83 y.o. male with medical history significant for hypertension, hyperlipidemia, type 2 diabetes mellitus, CAD status post CABG, chronic HFrEF, pulmonary fibrosis, and hiatal hernia repair with fundoplication in April 2025 admitted on 02/02/2024 with colitis after presenting with abdominal pain and nausea    -Assessment and Plan: 1)Acute Colitis mostly around the hepatic flexure----?? if related to recent abdominal surgery - No evidence of SBO, - CT also suggest some degree of ileus - WBC 18.6 - Patient remains afebrile -Requiring IV pain medications at this time - Continue IV Rocephin  and Flagyl  - Tolerated clear liquids okay to advance to full liquids in a.m. - As needed antiemetic - Continue IV fluids until oral intake is reliable -- Awaiting improvement in abdominal pain , nausea and in bowel function and better tolerance of oral intake -He is passing gas, no BM  2)DM2--A1c 6.6 reflecting good diabetic control PTA --Hold PTA Farxiga - Allow some permissive hyperglycemia until oral intake is more reliable with to avoid risking significant hypoglycemia Use Novolog /Humalog Sliding scale insulin  with Accu-Cheks/Fingersticks as ordered   3)GERD--history of hiatal hernia--stable, status post recent Nissen fundoplication - Continue Protonix   4)H/o CAD and Prior H/o CVA-continue Plavix  and Crestor  secondary prevention Had LHC on 07/05/23  5)HFrEF--history of ischemic cardiomyopathy with a EF in the 30 to 35% range based on echo from December 2024 -- Patient is currently at risk for dehydration due to nausea vomiting and abdominal pain in the setting of acute colitis - Hold Jardiance ,  Lasix  and Aldactone  until oral intake is more reliable - Also hold [place] as above #2 - Be judicious with IV fluids avoid volume overload  6)CKD II-patient with poor oral intake due to colitis nausea and vomiting with risk for dehydration and AKI  creatinine on admission=1.30  ,  baseline creatinine = 1.1 on 02/01/24   ,  creatinine is now= with hydration renal function is currently stable with creatinine of 1.19 -Hold Farxiga, Lasix  and Aldactone  -Continue IV fluids -, renally adjust medications, avoid nephrotoxic agents / dehydration  / hypotension   Patient seen and evaluated, chart reviewed, please see EMR for updated orders. Please see full H&P dictated by admitting physician Dr. Charlton for same date of service.  - Total care time 43 minutes   Status is: Inpatient   Disposition: The patient is from: Home              Anticipated d/c is to: Home              Anticipated d/c date is: 1 day              Patient currently is not medically stable to d/c. Barriers: Not Clinically Stable-   Code Status :  -  Code Status: Full Code   Family Communication:   NA (patient is alert, awake and coherent)   DVT Prophylaxis  :   - SCDs   SCDs Start: 02/02/24 0045   Lab Results  Component Value Date   PLT 284 02/02/2024    Inpatient Medications  Scheduled Meds:  clopidogrel   75 mg Oral Daily   insulin  aspart  0-6 Units Subcutaneous  Q4H   pantoprazole   40 mg Oral Daily   rosuvastatin   40 mg Oral Daily   Continuous Infusions:  sodium chloride  50 mL/hr at 02/02/24 1715   cefTRIAXone  (ROCEPHIN )  IV Stopped (02/02/24 0210)   metronidazole  100 mL/hr at 02/02/24 1715   PRN Meds:.acetaminophen  **OR** acetaminophen , fentaNYL  (SUBLIMAZE ) injection, oxyCODONE , prochlorperazine , traZODone , trimethobenzamide    Anti-infectives (From admission, onward)    Start     Dose/Rate Route Frequency Ordered Stop   02/02/24 0200  cefTRIAXone  (ROCEPHIN ) 2 g in sodium chloride  0.9 % 100 mL IVPB         2 g 200 mL/hr over 30 Minutes Intravenous Every 24 hours 02/02/24 0048     02/02/24 0100  metroNIDAZOLE  (FLAGYL ) IVPB 500 mg        500 mg 100 mL/hr over 60 Minutes Intravenous Every 12 hours 02/02/24 0048          Subjective: Vinie Kulig today has no fevers,  No chest pain,    - No further emesis - Still requiring IV pain medications and antiemetics - Tolerated clear liquids--- will consider advancing to full liquids ----He is passing gas, no BM -- Awaiting improvement in abdominal pain , nausea and in bowel function  and better tolerance of oral intake  Patient seen and evaluated, chart reviewed, please see EMR for updated orders. Please see full H&P dictated by admitting physician Dr. Charlton for same date of service.  - Total care time 43 minutes  Objective: Vitals:   02/02/24 0046 02/02/24 0500 02/02/24 0506 02/02/24 1259  BP: 130/82  109/65 119/68  Pulse: 81  76 87  Resp: 18  18 15   Temp: 98.4 F (36.9 C)  98.9 F (37.2 C) 98.4 F (36.9 C)  TempSrc:      SpO2: 90%  94% 93%  Weight:  90.6 kg      Intake/Output Summary (Last 24 hours) at 02/02/2024 1803 Last data filed at 02/02/2024 1715 Gross per 24 hour  Intake 1375.96 ml  Output 200 ml  Net 1175.96 ml   Filed Weights   02/02/24 0500  Weight: 90.6 kg    Physical Exam  Gen:- Awake Alert, in no acute distress HEENT:- [place].AT, No sclera icterus Ears--somewhat HOH Neck-Supple Neck,No JVD,.  Lungs-  CTAB , fair symmetrical air movement CV- S1, S2 normal, regular  Abd-  +ve B.Sounds, Abd Soft, abdominal tenderness mostly on the right, no rebound or guarding,    Extremity/Skin:- No  edema, pedal pulses present  Psych-affect is appropriate, oriented x3 Neuro-no new focal deficits, no tremors  Data Reviewed: I have personally reviewed following labs and imaging studies  CBC: Recent Labs  Lab 02/01/24 1936 02/01/24 1945 02/02/24 0323  WBC 10.2  --  18.6*  NEUTROABS 8.0*  --   --   HGB 11.1* 13.3 11.2*   HCT 40.2 39.0 43.1  MCV 74.9*  --  81.3  PLT 324  --  284   Basic Metabolic Panel: Recent Labs  Lab 02/01/24 1936 02/01/24 1945 02/02/24 0323  NA 142 143 141  K 3.7 3.6 4.8  CL 106 106 104  CO2 21*  --  21*  GLUCOSE 117* 120* 124*  BUN 12 13 11   CREATININE 1.19 1.30* 1.19  CALCIUM  9.3  --  9.2   GFR: Estimated Creatinine Clearance: 54.1 mL/min (by C-G formula based on SCr of 1.19 mg/dL). Liver Function Tests: Recent Labs  Lab 02/01/24 1936 02/02/24 0323  AST 22 29  ALT 9 10  ALKPHOS 57 58  BILITOT 0.3 0.5  PROT 8.1 8.0  ALBUMIN  4.2 4.1   HbA1C: Recent Labs    02/02/24 0323  HGBA1C 6.6*   Radiology Studies: DG Abdomen 1 View Result Date: 02/01/2024 CLINICAL DATA:  NG tube placement. EXAM: ABDOMEN - 1 VIEW COMPARISON:  Earlier films, same date. FINDINGS: The NG tube tip is in the body region of the stomach. No free air is identified. IMPRESSION: NG tube tip is in the body region of the stomach. Electronically Signed   By: MYRTIS Stammer M.D.   On: 02/01/2024 22:02   CT ABDOMEN PELVIS W CONTRAST Result Date: 02/01/2024 CLINICAL DATA:  Abdominal pain.  Concern for bowel obstruction EXAM: CT ABDOMEN AND PELVIS WITH CONTRAST TECHNIQUE: Multidetector CT imaging of the abdomen and pelvis was performed using the standard protocol following bolus administration of intravenous contrast. RADIATION DOSE REDUCTION: This exam was performed according to the departmental dose-optimization program which includes automated exposure control, adjustment of the mA and/or kV according to patient size and/or use of iterative reconstruction technique. CONTRAST:  OMNIPAQUE  IOHEXOL  300 MG/ML  SOLN COMPARISON:  CT abdomen pelvis dated 08/27/2023. FINDINGS: Lower chest: Diffuse chronic interstitial coarsening and fibrosis. There is coronary vascular calcification. No intra-abdominal free air.  Small free fluid in the pelvis. Hepatobiliary: The liver is unremarkable. No biliary dilatation. The  gallbladder is unremarkable. Pancreas: Unremarkable. No pancreatic ductal dilatation or surrounding inflammatory changes. Spleen: Normal in size without focal abnormality. Adrenals/Urinary Tract: The adrenal glands unremarkable. Right renal interpolar cyst. There is no hydronephrosis on either side. There is symmetric enhancement and excretion of contrast by both kidneys. The visualized ureters and urinary bladder probable. Stomach/Bowel: Interval repair of previously seen hiatal hernia. There is sigmoid diverticulosis without active inflammatory changes. Thickened appearance of the sigmoid colon likely related to underdistention and muscular hypertrophy. Underlying sigmoid mass is not excluded. Colonoscopy may provide better evaluation if clinically indicated. Mild diffuse air distention of the colon proximal to the sigmoid. There is loose stool within the colon suggestive of diarrheal state. There is pericolonic stranding and small amount of fluid primarily involving the hepatic flexure. This may be related to recent surgery. Clinical correlation recommended to evaluate for colitis. No evidence of small-bowel obstruction. The appendix is normal. Vascular/Lymphatic: Moderate aortoiliac atherosclerotic disease. The IVC is unremarkable. No portal venous gas. There is no adenopathy. Reproductive: The prostate is grossly remarkable Other: Ventral hernia repair. Musculoskeletal: Degenerative changes of the spine. No acute osseous pathology. IMPRESSION: 1. No evidence of small-bowel obstruction. 2. Mild pericolonic stranding and fluid primarily around the hepatic flexure may be related to recent surgery or represent mild colitis. Mildly dilated colon suggestive of ileus. 3. Sigmoid diverticulosis. 4.  Aortic Atherosclerosis (ICD10-I70.0). Electronically Signed   By: Vanetta Chou M.D.   On: 02/01/2024 21:03   DG Abdomen Acute W/Chest Result Date: 02/01/2024 CLINICAL DATA:  Abdomen pain vomiting EXAM: DG ABDOMEN  ACUTE WITH 1 VIEW CHEST COMPARISON:  CT 08/27/2023, chest x-ray 08/27/2023, 10/04/2021 FINDINGS: Single view chest demonstrates sternotomy. Low lung volumes. Hiatal hernia. Chronic interstitial opacities and linear areas of scarring. No pleural effusion or pneumothorax. Supine and upright views of the abdomen air distension of the colon with prominent air distension of cecum up to 10.5 cm. Some air-filled central small bowel loops. Gas in the rectum. Multiple fluid levels overlying the colon. No gross free air IMPRESSION: 1. Low lung volumes with chronic interstitial opacities and linear areas of scarring. 2. Air distension of the  colon with multiple fluid levels with some central air-filled small bowel, findings could be secondary to colonic ileus with low/distal obstruction not entirely excluded. CT follow-up may be performed if appropriate Electronically Signed   By: Luke Bun M.D.   On: 02/01/2024 20:07   Scheduled Meds:  clopidogrel   75 mg Oral Daily   insulin  aspart  0-6 Units Subcutaneous Q4H   pantoprazole   40 mg Oral Daily   rosuvastatin   40 mg Oral Daily   Continuous Infusions:  sodium chloride  50 mL/hr at 02/02/24 1715   cefTRIAXone  (ROCEPHIN )  IV Stopped (02/02/24 0210)   metronidazole  100 mL/hr at 02/02/24 1715    LOS: 0 days   Rendall Carwin M.D on 02/02/2024 at 6:03 PM  Go to [email or web address] - for contact info  [facility] - Office  [phone]  If 7PM-7AM, please contact night-coverage [email or web address] 02/02/2024, 6:03 PM

## 2024-02-02 NOTE — H&P (Addendum)
 History and Physical    Chris [name]. FMW:[id number] DOB: December 20, 1940 DOA: 02/01/2024  PCP: [name], MD   Patient coming from: Home   Chief Complaint: Abdominal pain, N/V   HPI: Chris [name]. is a 83 y.o. male with medical history significant for hypertension, hyperlipidemia, type 2 diabetes mellitus, CAD status post CABG, chronic HFrEF, pulmonary fibrosis, and hiatal hernia repair with fundoplication in April 2025 who presents with severe abdominal pain and nausea.  Patient reports that he developed severe abdominal pain today, felt bloated, and was having severe nausea with dry heaves.  Symptoms began this morning and have progressively worsened throughout the day.  Pain became very severe, he felt as though he needed to move his bowels, passed a small amount of solid stool, but this did not relieve his pain at all.  He continues to pass gas.  He denies diarrhea, fever, or chills associated with this.  ED Course: Upon arrival to the ED, patient is found to be afebrile and saturating well on room air initially with normal HR and elevated BP.  Labs are most notable for creatinine 1.19, normal LFTs and lipase, normal WBC, and normal troponin.  CT is negative for SBO but notable for mild pericolonic stranding involving the hepatic flexure and mildly dilated colon suggestive of ileus.  Patient was treated in the ED with IV fluids, Zofran , and Dilaudid .  Review of Systems:  All other systems reviewed and apart from HPI, are negative.  Past Medical History:  Diagnosis Date   Abnormal exercise myocardial perfusion study    03/2018- 38% ef, see report   Arthritis    Chronic kidney disease    Congestive heart failure (CHF) (HCC) 09/2015   Coronary artery disease    DOE (dyspnea on exertion)    ED (erectile dysfunction)    GERD (gastroesophageal reflux disease)    occ   Gout    H/O hiatal hernia    Hyperlipidemia    Hypertension    Myocardial infarction [facility])     Neuropathy    toes   Non-ST elevation (NSTEMI) myocardial infarction (HCC) 02/15/2014   Obesity    RBBB (right bundle branch block)    Stenosis of right internal carotid artery    Type II diabetes mellitus (HCC)    Umbilical hernia    a. s/p repair.   Vertigo     Past Surgical History:  Procedure Laterality Date   ANKLE ARTHROSCOPY WITH DRILLING/MICROFRACTURE Left 04/13/2013   Procedure: LEFT ANKLE ARTHROSCOPY WITH EXTENSIVE DEBRIEDMENT;  Surgeon: Norleen Armor, MD;  Location: [facility];  Service: Orthopedics;  Laterality: Left;   ANKLE SURGERY Left 08/18/2013   DR HEWITT   CARDIAC CATHETERIZATION  2000   stents x3   CARDIAC CATHETERIZATION  02/15/2014   Procedure: LEFT HEART CATH AND CORS/GRAFTS ANGIOGRAPHY;  Surgeon: Erick JONELLE Bergamo, MD;  Location: [facility] CATH LAB;  Service: Cardiovascular;;   COLONOSCOPY     CORONARY ARTERY BYPASS GRAFT  1995   LIMA to LAD, SVG to RCA, SVG to OM.    CORONARY STENT INTERVENTION N/A 08/12/2020   Procedure: CORONARY STENT INTERVENTION;  Surgeon: Elmira Newman PARAS, MD;  Location: [facility] INVASIVE CV LAB;  Service: Cardiovascular;  Laterality: N/A;   CORONARY STENT INTERVENTION N/A 09/04/2021   Procedure: CORONARY STENT INTERVENTION;  Surgeon: Bergamo Heinz, MD;  Location: [facility] INVAS[facility] CV LAB;  Service: Cardiovascular;  Laterality: N/A;   ESOPHAGOGASTRODUODENOSCOPY N/A 08/30/2023   Procedure: EGD (ESOPHAGOGASTRODUODENOSCOPY);  Surgeon:  Pyrtle, Gordy HERO, MD;  Location: [facility];  Service: Gastroenterology;  Laterality: N/A;   HIATAL HERNIA REPAIR N/A 09/02/2023   Procedure: REPAIR, HERNIA, HIATAL, LAPAROSCOPIC;  Surgeon: Rubin Calamity, MD;  Location: [facility] ORS;  Service: General;  Laterality: N/A;  ROBOTIC HIATAL HERNIA REPAIR WITH MESH AND FUNDOPLICATION   LEFT HEART CATH AND CORS/GRAFTS ANGIOGRAPHY N/A 08/12/2020   Procedure: LEFT HEART CATH AND CORS/GRAFTS ANGIOGRAPHY;  Surgeon: Elmira Newman PARAS, MD;  Location: [facility];  Service:  Cardiovascular;  Laterality: N/A;   LEFT HEART CATH AND CORS/GRAFTS ANGIOGRAPHY N/A 09/04/2021   Procedure: LEFT HEART CATH AND CORS/GRAFTS ANGIOGRAPHY;  Surgeon: Ladona Gordy, MD;  Location: [facility];  Service: Cardiovascular;  Laterality: N/A;   RIGHT/LEFT HEART CATH AND CORONARY/GRAFT ANGIOGRAPHY N/A 07/05/2023   Procedure: RIGHT/LEFT HEART CATH AND CORONARY/GRAFT ANGIOGRAPHY;  Surgeon: Ladona Gordy, MD;  Location: [facility];  Service: Cardiovascular;  Laterality: N/A;   TOTAL ANKLE ARTHROPLASTY Left 08/17/2013   Procedure: LEFT TOTAL ANKLE REPLACEMENT WITH POSSIBLE GASTROC RECESSION ;  Surgeon: Norleen Armor, MD;  Location: [facility] OR;  Service: Orthopedics;  Laterality: Left;   UMBILICAL HERNIA REPAIR  12/2007    Social History:   reports that he quit smoking about 53 years ago. His smoking use included cigarettes. He started smoking about 62 years ago. He has a 9 pack-year smoking history. He has quit using smokeless tobacco.  His smokeless tobacco use included chew. He reports that he does not currently use alcohol. He reports that he does not use drugs.  Allergies  Allergen Reactions   Brilinta  [Ticagrelor ] Shortness Of Breath   Alpha-Gal Hives   Crestor  [Rosuvastatin  Calcium ] Other (See Comments)    Muscle pain- tolerating this 2022, however   Lipitor [Atorvastatin ] Other (See Comments)    Intolerable muscle pain all over     Family History  Problem Relation Age of Onset   Aneurysm Mother    Heart disease Father    Heart attack Father    Stroke Brother      Prior to Admission medications   Medication Sig Start Date End Date Taking? Authorizing Provider  acetaminophen  (TYLENOL ) 500 MG tablet Take 500 mg by mouth every 6 (six) hours as needed for mild pain.    [provider]  Blood Glucose Monitoring Suppl DEVI 1 each by Does not apply route in the morning, at noon, and at bedtime. May substitute to any manufacturer covered by patient's insurance. 11/16/22    [name], MD  clopidogrel  (PLAVIX ) 75 MG tablet TAKE 1 TABLET EVERY DAY 09/13/23   [name], MD  cyanocobalamin  (VITAMIN B12) 1000 MCG tablet Take 1 tablet (1,000 mcg total) by mouth daily. 07/20/23 07/14/24  [name], MD  empagliflozin  (JARDIANCE ) 10 MG TABS tablet Take 1 tablet (10 mg total) by mouth daily before breakfast. 09/10/23   [name], MD  furosemide  (LASIX ) 20 MG tablet Take 20 mg by mouth as needed for fluid. 09/14/23   [provider]  nitroGLYCERIN  (NITROSTAT ) 0.4 MG SL tablet Place 1 tablet (0.4 mg total) under the tongue every 5 (five) minutes as needed for chest pain. 04/06/22   [name], MD  oxyCODONE  (OXY IR/ROXICODONE ) 5 MG immediate release tablet Take 1 tablet (5 mg total) by mouth every 6 (six) hours as needed (pain). 09/03/23   Tammy Sor, PA-C  pantoprazole  (PROTONIX ) 40 MG tablet Take 1 tablet (40 mg total) by mouth daily. 09/03/23  Jillian Buttery, MD  rosuvastatin  (CRESTOR ) 40 MG tablet TAKE 1 TABLET EVERY DAY 06/10/23   Ladona Heinz, MD  spironolactone  (ALDACTONE ) 25 MG tablet Take 0.5 tablets (12.5 mg total) by mouth daily. 11/16/23 02/14/24  [name], MD  sucralfate  (CARAFATE ) 1 g tablet TAKE 1 TABLET BY MOUTH FOUR TIMES DAILY WITH MEALS AND AT BEDTIME 05/12/23   [name], MD  traZODone  (DESYREL ) 50 MG tablet Take 1 tablet (50 mg total) by mouth at bedtime as needed for sleep. 01/13/24   [name], MD    Physical Exam: Vitals:   02/01/24 2103 02/01/24 2245 02/01/24 2315 02/01/24 2359  BP: (!) 145/84 (!) 149/89 135/79   Pulse: 93 73 74   Resp: (!) 26 14 15    Temp:    97.6 F (36.4 C)  TempSrc:    Oral  SpO2: 93% 94% 93%     Constitutional: NAD, calm  Eyes: PERTLA, lids and conjunctivae normal ENMT: Mucous membranes are moist. Posterior pharynx clear of any exudate or lesions.   Neck: supple, no masses  Respiratory: no wheezing, no crackles. No accessory muscle use.  Cardiovascular: S1  & S2 heard, regular rate and rhythm. No extremity edema.   Abdomen: Soft, distended, generally tender. Bowel sounds hypoactive.  Musculoskeletal: no clubbing / cyanosis. No joint deformity upper and lower extremities.   Skin: no significant rashes, lesions, ulcers. Warm, dry, well-perfused. Neurologic: CN 2-12 grossly intact. Moving all extremities. Alert and oriented.  Psychiatric: Pleasant. Cooperative.    Labs and Imaging on Admission: I have personally reviewed following labs and imaging studies  CBC: Recent Labs  Lab 02/01/24 1936 02/01/24 1945  WBC 10.2  --   NEUTROABS 8.0*  --   HGB 11.1* 13.3  HCT 40.2 39.0  MCV 74.9*  --   PLT 324  --    Basic Metabolic Panel: Recent Labs  Lab 02/01/24 1936 02/01/24 1945  NA 142 143  K 3.7 3.6  CL 106 106  CO2 21*  --   GLUCOSE 117* 120*  BUN 12 13  CREATININE 1.19 1.30*  CALCIUM  9.3  --    GFR: CrCl cannot be calculated (Unknown ideal weight.). Liver Function Tests: Recent Labs  Lab 02/01/24 1936  AST 22  ALT 9  ALKPHOS 57  BILITOT 0.3  PROT 8.1  ALBUMIN  4.2   Recent Labs  Lab 02/01/24 1936  LIPASE 40   No results for input(s): AMMONIA in the last 168 hours. Coagulation Profile: No results for input(s): INR, PROTIME in the last 168 hours. Cardiac Enzymes: No results for input(s): CKTOTAL, CKMB, CKMBINDEX, TROPONINI in the last 168 hours. BNP (last 3 results) No results for input(s): PROBNP in the last 8760 hours. HbA1C: No results for input(s): HGBA1C in the last 72 hours. CBG: No results for input(s): GLUCAP in the last 168 hours. Lipid Profile: No results for input(s): CHOL, HDL, LDLCALC, TRIG, CHOLHDL, LDLDIRECT in the last 72 hours. Thyroid  Function Tests: No results for input(s): TSH, T4TOTAL, FREET4, T3FREE, THYROIDAB in the last 72 hours. Anemia Panel: No results for input(s): VITAMINB12, FOLATE, FERRITIN, TIBC, IRON , RETICCTPCT in the last 72  hours. Urine analysis:    Component Value Date/Time   COLORURINE YELLOW 10/15/2020 1321   APPEARANCEUR CLEAR 10/15/2020 1321   LABSPEC 1.020 10/15/2020 1321   PHURINE 5.5 10/15/2020 1321   GLUCOSEU NEGATIVE 10/15/2020 1321   HGBUR NEGATIVE 10/15/2020 1321   BILIRUBINUR NEGATIVE 10/15/2020 1321   KETONESUR TRACE (A) 10/15/2020 1321  PROTEINUR NEGATIVE 10/15/2020 1321   NITRITE NEGATIVE 10/15/2020 1321   LEUKOCYTESUR NEGATIVE 10/15/2020 1321   Sepsis Labs: @LABRCNTIP (procalcitonin:4,lacticidven:4) )No results found for this or any previous visit (from the past 240 hours).   Radiological Exams on Admission: DG Abdomen 1 View Result Date: 02/01/2024 CLINICAL DATA:  NG tube placement. EXAM: ABDOMEN - 1 VIEW COMPARISON:  Earlier films, same date. FINDINGS: The NG tube tip is in the body region of the stomach. No free air is identified. IMPRESSION: NG tube tip is in the body region of the stomach. Electronically Signed   By: MYRTIS Stammer M.D.   On: 02/01/2024 22:02   CT ABDOMEN PELVIS W CONTRAST Result Date: 02/01/2024 CLINICAL DATA:  Abdominal pain.  Concern for bowel obstruction EXAM: CT ABDOMEN AND PELVIS WITH CONTRAST TECHNIQUE: Multidetector CT imaging of the abdomen and pelvis was performed using the standard protocol following bolus administration of intravenous contrast. RADIATION DOSE REDUCTION: This exam was performed according to the departmental dose-optimization program which includes automated exposure control, adjustment of the mA and/or kV according to patient size and/or use of iterative reconstruction technique. CONTRAST:  OMNIPAQUE  IOHEXOL  300 MG/ML  SOLN COMPARISON:  CT abdomen pelvis dated 08/27/2023. FINDINGS: Lower chest: Diffuse chronic interstitial coarsening and fibrosis. There is coronary vascular calcification. No intra-abdominal free air.  Small free fluid in the pelvis. Hepatobiliary: The liver is unremarkable. No biliary dilatation. The gallbladder is  unremarkable. Pancreas: Unremarkable. No pancreatic ductal dilatation or surrounding inflammatory changes. Spleen: Normal in size without focal abnormality. Adrenals/Urinary Tract: The adrenal glands unremarkable. Right renal interpolar cyst. There is no hydronephrosis on either side. There is symmetric enhancement and excretion of contrast by both kidneys. The visualized ureters and urinary bladder probable. Stomach/Bowel: Interval repair of previously seen hiatal hernia. There is sigmoid diverticulosis without active inflammatory changes. Thickened appearance of the sigmoid colon likely related to underdistention and muscular hypertrophy. Underlying sigmoid mass is not excluded. Colonoscopy may provide better evaluation if clinically indicated. Mild diffuse air distention of the colon proximal to the sigmoid. There is loose stool within the colon suggestive of diarrheal state. There is pericolonic stranding and small amount of fluid primarily involving the hepatic flexure. This may be related to recent surgery. Clinical correlation recommended to evaluate for colitis. No evidence of small-bowel obstruction. The appendix is normal. Vascular/Lymphatic: Moderate aortoiliac atherosclerotic disease. The IVC is unremarkable. No portal venous gas. There is no adenopathy. Reproductive: The prostate is grossly remarkable Other: Ventral hernia repair. Musculoskeletal: Degenerative changes of the spine. No acute osseous pathology. IMPRESSION: 1. No evidence of small-bowel obstruction. 2. Mild pericolonic stranding and fluid primarily around the hepatic flexure may be related to recent surgery or represent mild colitis. Mildly dilated colon suggestive of ileus. 3. Sigmoid diverticulosis. 4.  Aortic Atherosclerosis (ICD10-I70.0). Electronically Signed   By: Vanetta Chou M.D.   On: 02/01/2024 21:03   DG Abdomen Acute W/Chest Result Date: 02/01/2024 CLINICAL DATA:  Abdomen pain vomiting EXAM: DG ABDOMEN ACUTE WITH 1 VIEW  CHEST COMPARISON:  CT 08/27/2023, chest x-ray 08/27/2023, 10/04/2021 FINDINGS: Single view chest demonstrates sternotomy. Low lung volumes. Hiatal hernia. Chronic interstitial opacities and linear areas of scarring. No pleural effusion or pneumothorax. Supine and upright views of the abdomen air distension of the colon with prominent air distension of cecum up to 10.5 cm. Some air-filled central small bowel loops. Gas in the rectum. Multiple fluid levels overlying the colon. No gross free air IMPRESSION: 1. Low lung volumes with chronic interstitial opacities and  linear areas of scarring. 2. Air distension of the colon with multiple fluid levels with some central air-filled small bowel, findings could be secondary to colonic ileus with low/distal obstruction not entirely excluded. CT follow-up may be performed if appropriate Electronically Signed   By: Luke Bun M.D.   On: 02/01/2024 20:07    EKG: Independently reviewed. Sinus rhythm, PAC, RBBB, LAFB.   Assessment/Plan  1. Ileus  - Possibly due to infectious colitis given CT findings  - Start empiric antibiotics, continue antiemetics, hold diuretics, trial clears as he improves   2. Chronic HFrEF  - Appears compensated  - Hold diuretics given his N/V, monitor volume status    3. CAD  - No anginal symptoms  - Continue Crestor  and Plavix    4. Type II DM  - A1c was 6.8% in January 2025  - Check CBGs and use low-intensity SSI for now    5. Pulmonary fibrosis  - Followed by [facility]    DVT prophylaxis: SCDs Code Status: Full  Level of Care: Level of care: Med-Surg Family Communication: None present  Disposition Plan:  Patient is from: home  Anticipated d/c is to: Home  Anticipated d/c date is: 9/10 or 02/03/24  Patient currently: Pending tolerance of adequate oral intake  Consults called: none  Admission status: Observation     Evalene GORMAN Sprinkles, MD [facility]  02/02/2024, 12:45 AM

## 2024-02-03 ENCOUNTER — Inpatient Hospital Stay (HOSPITAL_COMMUNITY)

## 2024-02-03 DIAGNOSIS — K567 Ileus, unspecified: Secondary | ICD-10-CM | POA: Diagnosis not present

## 2024-02-03 LAB — CBC WITH DIFFERENTIAL/PLATELET
Abs Immature Granulocytes: 0.05 K/uL (ref 0.00–0.07)
Basophils Absolute: 0.1 K/uL (ref 0.0–0.1)
Basophils Relative: 0 %
Eosinophils Absolute: 0.4 K/uL (ref 0.0–0.5)
Eosinophils Relative: 3 %
HCT: 35.9 % — ABNORMAL LOW (ref 39.0–52.0)
Hemoglobin: 9.8 g/dL — ABNORMAL LOW (ref 13.0–17.0)
Immature Granulocytes: 0 %
Lymphocytes Relative: 16 %
Lymphs Abs: 2.1 K/uL (ref 0.7–4.0)
MCH: 20.8 pg — ABNORMAL LOW (ref 26.0–34.0)
MCHC: 27.3 g/dL — ABNORMAL LOW (ref 30.0–36.0)
MCV: 76.2 fL — ABNORMAL LOW (ref 80.0–100.0)
Monocytes Absolute: 1.2 K/uL — ABNORMAL HIGH (ref 0.1–1.0)
Monocytes Relative: 10 %
Neutro Abs: 9.1 K/uL — ABNORMAL HIGH (ref 1.7–7.7)
Neutrophils Relative %: 71 %
Platelets: 275 K/uL (ref 150–400)
RBC: 4.71 MIL/uL (ref 4.22–5.81)
RDW: 19.9 % — ABNORMAL HIGH (ref 11.5–15.5)
WBC: 12.9 K/uL — ABNORMAL HIGH (ref 4.0–10.5)
nRBC: 0 % (ref 0.0–0.2)

## 2024-02-03 LAB — URINALYSIS, ROUTINE W REFLEX MICROSCOPIC
Bacteria, UA: NONE SEEN
Bilirubin Urine: NEGATIVE
Glucose, UA: >=500 mg/dL — AB
Hgb urine dipstick: NEGATIVE
Ketones, ur: NEGATIVE mg/dL
Leukocytes,Ua: NEGATIVE
Nitrite: NEGATIVE
Protein, ur: NEGATIVE mg/dL
Specific Gravity, Urine: 1.018 (ref 1.005–1.030)
pH: 5 (ref 5.0–8.0)

## 2024-02-03 LAB — BASIC METABOLIC PANEL WITH GFR
Anion gap: 11 (ref 5–15)
BUN: 12 mg/dL (ref 8–23)
CO2: 22 mmol/L (ref 22–32)
Calcium: 8.6 mg/dL — ABNORMAL LOW (ref 8.9–10.3)
Chloride: 102 mmol/L (ref 98–111)
Creatinine, Ser: 1.19 mg/dL (ref 0.61–1.24)
GFR, Estimated: >60 mL/min (ref >60)
Glucose, Bld: 114 mg/dL — ABNORMAL HIGH (ref 70–99)
Potassium: 4 mmol/L (ref 3.5–5.1)
Sodium: 135 mmol/L (ref 135–145)

## 2024-02-03 LAB — GLUCOSE, CAPILLARY
Glucose-Capillary: 128 mg/dL — ABNORMAL HIGH (ref 70–99)
Glucose-Capillary: 123 mg/dL — ABNORMAL HIGH (ref 70–99)
Glucose-Capillary: 102 mg/dL — ABNORMAL HIGH (ref 70–99)
Glucose-Capillary: 121 mg/dL — ABNORMAL HIGH (ref 70–99)
Glucose-Capillary: 96 mg/dL (ref 70–99)

## 2024-02-03 MED ORDER — INSULIN ASPART 100 UNIT/ML IJ SOLN: 0.0000 [IU] | Freq: Three times a day (TID) | INTRAMUSCULAR | Status: DC

## 2024-02-03 MED ORDER — SIMETHICONE 80 MG PO CHEW
80.0000 mg | CHEWABLE_TABLET | Freq: Four times a day (QID) | ORAL | Status: DC | PRN
  Administered 2024-02-03 – 2024-02-04 (×2): 80 mg via ORAL
  Filled 2024-02-03 (×2): qty 1

## 2024-02-03 NOTE — Progress Notes (Signed)
  Progress Note   Patient: Chris [name]. FMW:[id number] DOB: [birth year] DOA: 02/01/2024     1 DOS: the patient was seen and examined on 02/03/2024   Brief hospital course:  83 y.o. male with medical history significant for hypertension, hyperlipidemia, type 2 diabetes mellitus, CAD status post CABG, chronic HFrEF, pulmonary fibrosis, and hiatal hernia repair with fundoplication in April 2025 admitted on 02/02/2024 with colitis after presenting with abdominal pain and nausea     Assessment and Plan:  1)Acute Colitis mostly around the hepatic flexure ?? if related to recent abdominal surgery No evidence of SBO, - CT also suggest some degree of ileus WBC 18.6 on admission but shows a downward trend. 18.6 >>12.9 Continues to have abdominal pain mostly in the right lower quadrant which patient states is unchanged from admission Continue empiric antibiotic therapy with IV Rocephin  and Flagyl  Continue analgesia as needed Diet advanced to full liquids Still has not had a bowel movement but passing gas     2)DM2--A1c 6.6 reflecting good glycemic control Hold PTA Farxiga Allow some permissive hyperglycemia until oral intake is more reliable to avoid risking significant hypoglycemia Continue Novolog  Sliding scale insulin  with Accu-Cheks/Fingersticks as ordered     3)GERD History of hiatal hernia status post recent Nissen fundoplication Continue Protonix     4)H/o CAD and Prior H/o CVA Continue Plavix  and Crestor  as secondary prevention Had LHC on 07/05/23    5)HFrEF--history of ischemic cardiomyopathy with a EF in the 30 to 35% range based on echo from December 2024 Patient is currently at risk for dehydration due to nausea vomiting and abdominal pain in the setting of acute colitis Hold Jardiance , Lasix , Farxiga and Aldactone  until oral intake is more reliable Be judicious with IV fluids avoid volume overload    6)CKD II- Patient with poor oral intake due to colitis, nausea and  vomiting at risk for dehydration and AKI Creatinine on admission=1.30  ,  Baseline creatinine = 1.1 on 02/01/24   , Improved renal function with hydration Continue to hold Farxiga, Lasix  and Aldactone  renally adjust medications, avoid nephrotoxic agents / dehydration  / hypotension         Subjective: Denies having any nausea.  Abdominal pain persists  Physical Exam: Vitals:   02/02/24 2336 02/03/24 0456 02/03/24 0500 02/03/24 1405  BP: 95/74 108/76  114/85  Pulse: 84 90  79  Resp: 18 18  18   Temp: 98.2 F (36.8 C) 99.6 F (37.6 C)  99.2 F (37.3 C)  TempSrc: Oral Oral  Oral  SpO2: 90% 90%  95%  Weight:   94.7 kg   Height:       Gen:- Awake Alert, in no acute distress HEENT:- [place].AT, No sclera icterus Ears--somewhat HOH Neck-Supple Neck,No JVD,.  Lungs-  CTAB , fair symmetrical air movement CV- S1, S2 normal, regular  Abd-  +ve B.Sounds, Abd Soft, abdominal tenderness mostly in the right lower quadrant, no rebound or guarding,    Extremity/Skin:- No  edema, pedal pulses present  Psych-affect is appropriate, oriented x3 Neuro-no new focal deficits, no tremors   Data Reviewed: White count 12.9, hemoglobin 9.8, MCV 76.2, Labs reviewed  Family Communication: Plan of care discussed with patient in detail.  He verbalizes understanding and agrees with plan  Disposition: Status is: Inpatient Remains inpatient appropriate because: Remains on IV antibiotics  Planned Discharge Destination: Home    Time spent: 45 minutes  Author: Aimee Somerset, MD 02/03/2024 2:24 PM  For on call review [email or web address].

## 2024-02-03 NOTE — Progress Notes (Signed)
   02/03/24 1520  TOC Brief Assessment  Insurance and Status Reviewed  Patient has primary care physician Yes  Home environment has been reviewed home alone  Prior level of function: independent  Prior/Current Home Services No current home services  Social Drivers of Health Review SDOH reviewed no interventions necessary  Readmission risk has been reviewed Yes  Transition of care needs no transition of care needs at this time

## 2024-02-03 NOTE — Plan of Care (Signed)
  Problem: Pain Managment: Goal: General experience of comfort will improve and/or be controlled Outcome: Progressing   Problem: Safety: Goal: Ability to remain free from injury will improve Outcome: Progressing   Problem: Skin Integrity: Goal: Risk for impaired skin integrity will decrease Outcome: Progressing   Problem: Coping: Goal: Ability to adjust to condition or change in health will improve Outcome: Progressing   Problem: Nutritional: Goal: Maintenance of adequate nutrition will improve Outcome: Progressing   Problem: Skin Integrity: Goal: Risk for impaired skin integrity will decrease Outcome: Progressing

## 2024-02-04 ENCOUNTER — Other Ambulatory Visit: Payer: Self-pay | Admitting: Family Medicine

## 2024-02-04 DIAGNOSIS — K567 Ileus, unspecified: Secondary | ICD-10-CM | POA: Diagnosis not present

## 2024-02-04 LAB — CBC WITH DIFFERENTIAL/PLATELET
Abs Immature Granulocytes: 0.03 K/uL (ref 0.00–0.07)
Basophils Absolute: 0.1 K/uL (ref 0.0–0.1)
Basophils Relative: 1 %
Eosinophils Absolute: 0.5 K/uL (ref 0.0–0.5)
Eosinophils Relative: 5 %
HCT: 35.3 % — ABNORMAL LOW (ref 39.0–52.0)
Hemoglobin: 10.1 g/dL — ABNORMAL LOW (ref 13.0–17.0)
Immature Granulocytes: 0 %
Lymphocytes Relative: 21 %
Lymphs Abs: 2.2 K/uL (ref 0.7–4.0)
MCH: 21.8 pg — ABNORMAL LOW (ref 26.0–34.0)
MCHC: 28.6 g/dL — ABNORMAL LOW (ref 30.0–36.0)
MCV: 76.2 fL — ABNORMAL LOW (ref 80.0–100.0)
Monocytes Absolute: 1 K/uL (ref 0.1–1.0)
Monocytes Relative: 9 %
Neutro Abs: 6.6 K/uL (ref 1.7–7.7)
Neutrophils Relative %: 64 %
Platelets: 293 K/uL (ref 150–400)
RBC: 4.63 MIL/uL (ref 4.22–5.81)
RDW: 20.4 % — ABNORMAL HIGH (ref 11.5–15.5)
WBC: 10.4 K/uL (ref 4.0–10.5)
nRBC: 0 % (ref 0.0–0.2)

## 2024-02-04 LAB — GLUCOSE, CAPILLARY
Glucose-Capillary: 111 mg/dL — ABNORMAL HIGH (ref 70–99)
Glucose-Capillary: 115 mg/dL — ABNORMAL HIGH (ref 70–99)

## 2024-02-04 MED ORDER — SIMETHICONE 80 MG PO CHEW: 80.0000 mg | CHEWABLE_TABLET | Freq: Four times a day (QID) | ORAL | 0 refills | Status: AC | PRN

## 2024-02-04 MED ORDER — BISACODYL 10 MG RE SUPP
10.0000 mg | Freq: Once | RECTAL | Status: AC
  Administered 2024-02-04: 10 mg via RECTAL
  Filled 2024-02-04: qty 1

## 2024-02-04 MED ORDER — ACETAMINOPHEN 325 MG PO TABS: 650.0000 mg | ORAL_TABLET | Freq: Four times a day (QID) | ORAL | Status: AC | PRN

## 2024-02-04 MED ORDER — TRAZODONE HCL 50 MG PO TABS: 25.0000 mg | ORAL_TABLET | Freq: Every evening | ORAL | 2 refills | Status: AC | PRN

## 2024-02-04 MED ORDER — PANTOPRAZOLE SODIUM 40 MG PO TBEC: 40.0000 mg | DELAYED_RELEASE_TABLET | Freq: Every day | ORAL | 3 refills | Status: AC

## 2024-02-04 MED ORDER — METRONIDAZOLE 500 MG PO TABS: 500.0000 mg | ORAL_TABLET | Freq: Three times a day (TID) | ORAL | 0 refills | Status: AC

## 2024-02-04 MED ORDER — POLYETHYLENE GLYCOL 3350 17 G PO PACK
17.0000 g | PACK | Freq: Every day | ORAL | Status: DC
  Administered 2024-02-04: 17 g via ORAL
  Filled 2024-02-04: qty 1

## 2024-02-04 MED ORDER — CEFDINIR 300 MG PO CAPS: 300.0000 mg | ORAL_CAPSULE | Freq: Two times a day (BID) | ORAL | 0 refills | Status: AC

## 2024-02-04 MED ORDER — ACETAMINOPHEN 325 MG PO TABS
650.0000 mg | ORAL_TABLET | Freq: Once | ORAL | Status: AC
Start: 2024-02-04 — End: 2024-02-04
  Administered 2024-02-04: 650 mg via ORAL
  Filled 2024-02-04: qty 2

## 2024-02-04 MED ORDER — SUCRALFATE 1 G PO TABS: 1.0000 g | ORAL_TABLET | Freq: Three times a day (TID) | ORAL | 1 refills | Status: AC

## 2024-02-04 MED ORDER — SPIRONOLACTONE 25 MG PO TABS: 12.5000 mg | ORAL_TABLET | Freq: Every day | ORAL | 3 refills | Status: AC

## 2024-02-04 MED ORDER — ROSUVASTATIN CALCIUM 40 MG PO TABS
40.0000 mg | ORAL_TABLET | Freq: Every day | ORAL | 3 refills | Status: DC
Start: 2024-02-04 — End: 2024-03-30

## 2024-02-04 MED ORDER — POLYETHYLENE GLYCOL 3350 17 G PO PACK: 17.0000 g | PACK | Freq: Every day | ORAL | 2 refills | Status: AC

## 2024-02-04 MED ORDER — TRAMADOL HCL 50 MG PO TABS: 50.0000 mg | ORAL_TABLET | Freq: Two times a day (BID) | ORAL | 0 refills | Status: AC | PRN

## 2024-02-04 NOTE — Plan of Care (Signed)
  Problem: Activity: Goal: Risk for activity intolerance will decrease Outcome: Progressing   Problem: Nutrition: Goal: Adequate nutrition will be maintained Outcome: Progressing   Problem: Coping: Goal: Level of anxiety will decrease Outcome: Progressing   Problem: Pain Managment: Goal: General experience of comfort will improve and/or be controlled Outcome: Progressing   Problem: Skin Integrity: Goal: Risk for impaired skin integrity will decrease Outcome: Progressing   Problem: Coping: Goal: Ability to adjust to condition or change in health will improve Outcome: Progressing

## 2024-02-04 NOTE — Plan of Care (Signed)
  Problem: Education: Goal: Knowledge of General Education information will improve Description: Including pain rating scale, medication(s)/side effects and non-pharmacologic comfort measures Outcome: Progressing   Problem: Health Behavior/Discharge Planning: Goal: Ability to manage health-related needs will improve Outcome: Progressing   Problem: Clinical Measurements: Goal: Ability to maintain clinical measurements within normal limits will improve Outcome: Progressing Goal: Will remain free from infection Outcome: Progressing Goal: Diagnostic test results will improve Outcome: Progressing Goal: Respiratory complications will improve Outcome: Progressing Goal: Cardiovascular complication will be avoided Outcome: Progressing   Problem: Activity: Goal: Risk for activity intolerance will decrease Outcome: Progressing   Problem: Nutrition: Goal: Adequate nutrition will be maintained Outcome: Progressing   Problem: Coping: Goal: Level of anxiety will decrease Outcome: Progressing   Problem: Elimination: Goal: Will not experience complications related to bowel motility Outcome: Progressing   Problem: Pain Managment: Goal: General experience of comfort will improve and/or be controlled Outcome: Progressing   Problem: Safety: Goal: Ability to remain free from injury will improve Outcome: Progressing   Problem: Skin Integrity: Goal: Risk for impaired skin integrity will decrease Outcome: Progressing   Problem: Metabolic: Goal: Ability to maintain appropriate glucose levels will improve Outcome: Adequate for Discharge   Problem: Nutritional: Goal: Maintenance of adequate nutrition will improve Outcome: Progressing Goal: Progress toward achieving an optimal weight will improve Outcome: Progressing   Problem: Skin Integrity: Goal: Risk for impaired skin integrity will decrease Outcome: Adequate for Discharge   Problem: Tissue Perfusion: Goal: Adequacy of tissue  perfusion will improve Outcome: Adequate for Discharge

## 2024-02-04 NOTE — Discharge Instructions (Signed)
 1)Avoid ibuprofen/Advil/Aleve/Motrin/Goody Powders/Naproxen/BC powders/Meloxicam/Diclofenac/Indomethacin and other Nonsteroidal anti-inflammatory medications as these will make you more likely to bleed and can cause stomach ulcers, can also cause Kidney problems.   2)The 'BRAT' diet is suggested, then progress to diet as tolerated as symptoms abate.  -- BRAT (bananas, rice, apples, toast) -you may also consume other mild foods that ease the GI tract such as saltines, oatmeal, or boiled potatoes. Call if bloody stools, persistent diarrhea, vomiting, fever or abdominal pain.   3) please note that there has been changes to your medication  4) please avoid constipation  5) please follow-up with your primary care physician in about 4 to 5 days for recheck and reevaluation

## 2024-02-04 NOTE — Discharge Summary (Signed)
 Chris [name]., is a 83 y.o. male  DOB [birth year]  MRN [id number].  Admission date:  02/01/2024  Admitting Physician  Rendall Carwin, MD  Discharge Date:  02/04/2024   Primary MD  [name], MD  Recommendations for primary care physician for things to follow:   1)Avoid ibuprofen/Advil/Aleve/Motrin/Goody Powders/Naproxen/BC powders/Meloxicam/Diclofenac/Indomethacin and other Nonsteroidal anti-inflammatory medications as these will make you more likely to bleed and can cause stomach ulcers, can also cause Kidney problems.   2)The 'BRAT' diet is suggested, then progress to diet as tolerated as symptoms abate.  -- BRAT (bananas, rice, apples, toast) -you may also consume other mild foods that ease the GI tract such as saltines, oatmeal, or boiled potatoes. Call if bloody stools, persistent diarrhea, vomiting, fever or abdominal pain.   3) please note that there has been changes to your medication  4) please avoid constipation  5) please follow-up with your primary care physician in about 4 to 5 days for recheck and reevaluation  Admission Diagnosis  Ileus (HCC) [K56.7] Colitis [K52.9]   Discharge Diagnosis  Ileus (HCC) [K56.7] Colitis [K52.9]    Principal Problem:   Ileus (HCC) Active Problems:   CAD (coronary artery disease)   HFrEF (heart failure with reduced ejection fraction) (HCC)   Type II diabetes mellitus (HCC)   Pulmonary fibrosis (HCC)   Colitis      Past Medical History:  Diagnosis Date   Abnormal exercise myocardial perfusion study    03/2018- 38% ef, see report   Arthritis    Chronic kidney disease    Congestive heart failure (CHF) (HCC) 09/2015   Coronary artery disease    DOE (dyspnea on exertion)    ED (erectile dysfunction)    GERD (gastroesophageal reflux disease)    occ   Gout    H/O hiatal hernia    Hyperlipidemia    Hypertension    Myocardial  infarction (HCC)    Neuropathy    toes   Non-ST elevation (NSTEMI) myocardial infarction (HCC) 02/15/2014   Obesity    RBBB (right bundle branch block)    Stenosis of right internal carotid artery    Type II diabetes mellitus (HCC)    Umbilical hernia    a. s/p repair.   Vertigo     Past Surgical History:  Procedure Laterality Date   ANKLE ARTHROSCOPY WITH DRILLING/MICROFRACTURE Left 04/13/2013   Procedure: LEFT ANKLE ARTHROSCOPY WITH EXTENSIVE DEBRIEDMENT;  Surgeon: Norleen Armor, MD;  Location: [facility];  Service: Orthopedics;  Laterality: Left;   ANKLE SURGERY Left 08/18/2013   DR HEWITT   CARDIAC CATHETERIZATION  2000   stents x3   CARDIAC CATHETERIZATION  02/15/2014   Procedure: LEFT HEART CATH AND CORS/GRAFTS ANGIOGRAPHY;  Surgeon: Erick JONELLE Bergamo, MD;  Location: [facility] CATH LAB;  Service: Cardiovascular;;   COLONOSCOPY     CORONARY ARTERY BYPASS GRAFT  1995   LIMA to LAD, SVG to RCA, SVG to OM.    CORONARY STENT INTERVENTION N/A 08/12/2020   Procedure: CORONARY  STENT INTERVENTION;  Surgeon: Elmira Newman PARAS, MD;  Location: [facility] INVAS[facility] CV LAB;  Service: Cardiovascular;  Laterality: N/A;   CORONARY STENT INTERVENTION N/A 09/04/2021   Procedure: CORONARY STENT INTERVENTION;  Surgeon: Ladona Heinz, MD;  Location: [facility]AS[facility] CV LAB;  Service: Cardiovascular;  Laterality: N/A;   ESOPHAGOGASTRODUODENOSCOPY N/A 08/30/2023   Procedure: EGD (ESOPHAGOGASTRODUODENOSCOPY);  Surgeon: Albertus Heinz HERO, MD;  Location: [facility];  Service: Gastroenterology;  Laterality: N/A;   HIATAL HERNIA REPAIR N/A 09/02/2023   Procedure: REPAIR, HERNIA, HIATAL, LAPAROSCOPIC;  Surgeon: Rubin Calamity, MD;  Location: [facility];  Service: General;  Laterality: N/A;  ROBOTIC HIATAL HERNIA REPAIR WITH MESH AND FUNDOPLICATION   LEFT HEART CATH AND CORS/GRAFTS ANGIOGRAPHY N/A 08/12/2020   Procedure: LEFT HEART CATH AND CORS/GRAFTS ANGIOGRAPHY;  Surgeon: Elmira Newman PARAS, MD;  Location: [facility];  Service: Cardiovascular;  Laterality: N/A;   LEFT HEART CATH AND CORS/GRAFTS ANGIOGRAPHY N/A 09/04/2021   Procedure: LEFT HEART CATH AND CORS/GRAFTS ANGIOGRAPHY;  Surgeon: Ladona Heinz, MD;  Location: [facility];  Service: Cardiovascular;  Laterality: N/A;   RIGHT/LEFT HEART CATH AND CORONARY/GRAFT ANGIOGRAPHY N/A 07/05/2023   Procedure: RIGHT/LEFT HEART CATH AND CORONARY/GRAFT ANGIOGRAPHY;  Surgeon: Ladona Heinz, MD;  Location: [facility];  Service: Cardiovascular;  Laterality: N/A;   TOTAL ANKLE ARTHROPLASTY Left 08/17/2013   Procedure: LEFT TOTAL ANKLE REPLACEMENT WITH POSSIBLE GASTROC RECESSION ;  Surgeon: Norleen Armor, MD;  Location: [facility];  Service: Orthopedics;  Laterality: Left;   UMBILICAL HERNIA REPAIR  12/2007     HPI  from the history and physical done on the day of admission:   Chief Complaint: Abdominal pain, N/V    HPI: Chris [name]. is a 83 y.o. male with medical history significant for hypertension, hyperlipidemia, type 2 diabetes mellitus, CAD status post CABG, chronic HFrEF, pulmonary fibrosis, and hiatal hernia repair with fundoplication in April 2025 who presents with severe abdominal pain and nausea.   Patient reports that he developed severe abdominal pain today, felt bloated, and was having severe nausea with dry heaves.  Symptoms began this morning and have progressively worsened throughout the day.  Pain became very severe, he felt as though he needed to move his bowels, passed a small amount of solid stool, but this did not relieve his pain at all.  He continues to pass gas.  He denies diarrhea, fever, or chills associated with this.   ED Course: Upon arrival to the ED, patient is found to be afebrile and saturating well on room air initially with normal HR and elevated BP.  Labs are most notable for creatinine 1.19, normal LFTs and lipase, normal WBC, and normal troponin.  CT is negative for SBO but notable for mild pericolonic stranding involving the  hepatic flexure and mildly dilated colon suggestive of ileus.   Patient was treated in the ED with IV fluids, Zofran , and Dilaudid .   Review of Systems:  All other systems reviewed and apart from HPI, are negative.     Hospital Course:     Brief Narrative:  83 y.o. male with medical history significant for hypertension, hyperlipidemia, type 2 diabetes mellitus, CAD status post CABG, chronic HFrEF, pulmonary fibrosis, and hiatal hernia repair with fundoplication in April 2025 admitted on 02/02/2024 with colitis after presenting with abdominal pain and nausea     -Assessment and Plan: 1)Acute Colitis mostly around the hepatic flexure----POA -Does Not meet sepsis criteria -?? if related to recent abdominal surgery -  No evidence of SBO, - CT also suggest some degree of ileus -Follow-up abdominal x-rays from 02/03/2023 without acute changes - WBC 18.6>>12.9 >>10.4 - Treated with IV Rocephin  and Flagyl , okay to discharge on Omnicef  and Flagyl  -He is passing gas - Has not required IV pain medications since 02/02/2024 -Last dose of as needed oxycodone  was 6 PM on 02/03/2024 -Advance to solid diet -Patient received MiraLAX  and Dulcolax suppository and patient felt much better after having BM---abdominal pain improved further -May use Tylenol  and tramadol  as needed   2)DM2--A1c 6.6 reflecting good diabetic control PTA --- Okay to resume PTA diabetic regimen   3)GERD--history of hiatal hernia--stable, status post recent Nissen fundoplication - Continue Protonix  and Carafate    4)H/o CAD and Prior H/o CVA-continue Plavix  and Crestor  secondary prevention Had LHC on 07/05/23   5)HFrEF--history of ischemic cardiomyopathy with a EF in the 30 to 35% range based on echo from December 2024 -resume Lasix  and Aldactone  - -No evidence of CHF flareup at this time   6)CKD II-patient with poor oral intake due to colitis nausea and vomiting with risk for dehydration and AKI  creatinine on admission=1.30   ,  baseline creatinine = 1.1 on 02/01/24   ,  creatinine is now= with hydration renal function is currently stable with creatinine of 1.19 Renal function improved with IV fluids -, renally adjust medications, avoid nephrotoxic agents / dehydration  / hypotension   7) chronic anemia--hemoglobin greater than 10 -Overall stable H&H despite IV fluids  Discharge Condition: stable  Follow UP   Follow-up Information     [name], MD. Schedule an appointment as soon as possible for a visit on 02/08/2024.   Specialty: Family Medicine Contact information: [address] [place] [address] [phone]                 Diet and Activity recommendation:  As advised  Discharge Instructions    Discharge Instructions     Diet - low sodium heart healthy   Complete by: As directed    Discharge instructions   Complete by: As directed    1)Avoid ibuprofen/Advil/Aleve/Motrin/Goody Powders/Naproxen/BC powders/Meloxicam/Diclofenac/Indomethacin and other Nonsteroidal anti-inflammatory medications as these will make you more likely to bleed and can cause stomach ulcers, can also cause Kidney problems.   2)The 'BRAT' diet is suggested, then progress to diet as tolerated as symptoms abate.  -- BRAT (bananas, rice, apples, toast) -you may also consume other mild foods that ease the GI tract such as saltines, oatmeal, or boiled potatoes. Call if bloody stools, persistent diarrhea, vomiting, fever or abdominal pain.   3) please note that there has been changes to your medication  4) please avoid constipation  5) please follow-up with your primary care physician in about 4 to 5 days for recheck and reevaluation   Increase activity slowly   Complete by: As directed          Discharge Medications     Allergies as of 02/04/2024       Reactions   Brilinta  [ticagrelor ] Shortness Of Breath   Alpha-gal Hives   Crestor  [rosuvastatin  Calcium ] Other (See Comments)   Muscle  pain- tolerating this 2022, however   Lipitor [atorvastatin ] Other (See Comments)   Intolerable muscle pain all over         Medication List     STOP taking these medications    oxyCODONE  5 MG immediate release tablet Commonly known as: Oxy IR/ROXICODONE   TAKE these medications    acetaminophen  325 MG tablet Commonly known as: TYLENOL  Take 2 tablets (650 mg total) by mouth every 6 (six) hours as needed for mild pain (pain score 1-3) or fever (or Fever >/= 101). What changed:  medication strength how much to take when to take this reasons to take this   Blood Glucose Monitoring Suppl Devi 1 each by Does not apply route in the morning, at noon, and at bedtime. May substitute to any manufacturer covered by patient's insurance.   cefdinir  300 MG capsule Commonly known as: OMNICEF  Take 1 capsule (300 mg total) by mouth 2 (two) times daily for 7 days.   clopidogrel  75 MG tablet Commonly known as: PLAVIX  TAKE 1 TABLET EVERY DAY What changed: when to take this   cyanocobalamin  1000 MCG tablet Commonly known as: VITAMIN B12 Take 1 tablet (1,000 mcg total) by mouth daily.   empagliflozin  10 MG Tabs tablet Commonly known as: Jardiance  Take 1 tablet (10 mg total) by mouth daily before breakfast.   furosemide  20 MG tablet Commonly known as: LASIX  Take 20 mg by mouth in the morning.   LUNG PO Take 1 capsule by mouth in the morning.   metroNIDAZOLE  500 MG tablet Commonly known as: FLAGYL  Take 1 tablet (500 mg total) by mouth 3 (three) times daily for 7 days.   nitroGLYCERIN  0.4 MG SL tablet Commonly known as: NITROSTAT  Place 1 tablet (0.4 mg total) under the tongue every 5 (five) minutes as needed for chest pain.   pantoprazole  40 MG tablet Commonly known as: PROTONIX  Take 1 tablet (40 mg total) by mouth daily before breakfast.   polyethylene glycol 17 g packet Commonly known as: MIRALAX  / GLYCOLAX  Take 17 g by mouth daily. Start taking on: February 05, 2024   rosuvastatin  40 MG tablet Commonly known as: CRESTOR  Take 1 tablet (40 mg total) by mouth daily. What changed: when to take this   simethicone  80 MG chewable tablet Commonly known as: MYLICON Chew 1 tablet (80 mg total) by mouth 4 (four) times daily as needed for flatulence.   spironolactone  25 MG tablet Commonly known as: ALDACTONE  Take 0.5 tablets (12.5 mg total) by mouth daily.   sucralfate  1 g tablet Commonly known as: CARAFATE  Take 1 tablet (1 g total) by mouth 4 (four) times daily -  with meals and at bedtime. What changed: See the new instructions.   traMADol  50 MG tablet Commonly known as: Ultram  Take 1 tablet (50 mg total) by mouth every 12 (twelve) hours as needed.   traZODone  50 MG tablet Commonly known as: DESYREL  Take 0.5 tablets (25 mg total) by mouth at bedtime as needed for sleep.        Major procedures and Radiology Reports - PLEASE review detailed and final reports for all details, in brief -   DG Abd 2 Views Result Date: 02/03/2024 CLINICAL DATA:  [id number] Ileus [facility]) [id number] EXAM: ABDOMEN - 2 VIEW COMPARISON:  02/01/2024 FINDINGS: Generalized paucity of small bowel gas throughout the central abdomen. Nondistended gas-filled colon.No pneumoperitoneum. No organomegaly or radiopaque calculi. IMPRESSION: Generalized paucity of small bowel gas in the central abdomen. Otherwise, similar gaseous distention of the colon. Electronically Signed   By: Rogelia Myers M.D.   On: 02/03/2024 09:00   DG Abdomen 1 View Result Date: 02/01/2024 CLINICAL DATA:  NG tube placement. EXAM: ABDOMEN - 1 VIEW COMPARISON:  Earlier films, same date. FINDINGS: The NG tube tip is in the body region of the  stomach. No free air is identified. IMPRESSION: NG tube tip is in the body region of the stomach. Electronically Signed   By: MYRTIS Stammer M.D.   On: 02/01/2024 22:02   CT ABDOMEN PELVIS W CONTRAST Result Date: 02/01/2024 CLINICAL DATA:  Abdominal pain.  Concern for bowel  obstruction EXAM: CT ABDOMEN AND PELVIS WITH CONTRAST TECHNIQUE: Multidetector CT imaging of the abdomen and pelvis was performed using the standard protocol following bolus administration of intravenous contrast. RADIATION DOSE REDUCTION: This exam was performed according to the departmental dose-optimization program which includes automated exposure control, adjustment of the mA and/or kV according to patient size and/or use of iterative reconstruction technique. CONTRAST:  OMNIPAQUE  IOHEXOL  300 MG/ML  SOLN COMPARISON:  CT abdomen pelvis dated 08/27/2023. FINDINGS: Lower chest: Diffuse chronic interstitial coarsening and fibrosis. There is coronary vascular calcification. No intra-abdominal free air.  Small free fluid in the pelvis. Hepatobiliary: The liver is unremarkable. No biliary dilatation. The gallbladder is unremarkable. Pancreas: Unremarkable. No pancreatic ductal dilatation or surrounding inflammatory changes. Spleen: Normal in size without focal abnormality. Adrenals/Urinary Tract: The adrenal glands unremarkable. Right renal interpolar cyst. There is no hydronephrosis on either side. There is symmetric enhancement and excretion of contrast by both kidneys. The visualized ureters and urinary bladder probable. Stomach/Bowel: Interval repair of previously seen hiatal hernia. There is sigmoid diverticulosis without active inflammatory changes. Thickened appearance of the sigmoid colon likely related to underdistention and muscular hypertrophy. Underlying sigmoid mass is not excluded. Colonoscopy may provide better evaluation if clinically indicated. Mild diffuse air distention of the colon proximal to the sigmoid. There is loose stool within the colon suggestive of diarrheal state. There is pericolonic stranding and small amount of fluid primarily involving the hepatic flexure. This may be related to recent surgery. Clinical correlation recommended to evaluate for colitis. No evidence of small-bowel  obstruction. The appendix is normal. Vascular/Lymphatic: Moderate aortoiliac atherosclerotic disease. The IVC is unremarkable. No portal venous gas. There is no adenopathy. Reproductive: The prostate is grossly remarkable Other: Ventral hernia repair. Musculoskeletal: Degenerative changes of the spine. No acute osseous pathology. IMPRESSION: 1. No evidence of small-bowel obstruction. 2. Mild pericolonic stranding and fluid primarily around the hepatic flexure may be related to recent surgery or represent mild colitis. Mildly dilated colon suggestive of ileus. 3. Sigmoid diverticulosis. 4.  Aortic Atherosclerosis (ICD10-I70.0). Electronically Signed   By: Vanetta Chou M.D.   On: 02/01/2024 21:03   DG Abdomen Acute W/Chest Result Date: 02/01/2024 CLINICAL DATA:  Abdomen pain vomiting EXAM: DG ABDOMEN ACUTE WITH 1 VIEW CHEST COMPARISON:  CT 08/27/2023, chest x-ray 08/27/2023, 10/04/2021 FINDINGS: Single view chest demonstrates sternotomy. Low lung volumes. Hiatal hernia. Chronic interstitial opacities and linear areas of scarring. No pleural effusion or pneumothorax. Supine and upright views of the abdomen air distension of the colon with prominent air distension of cecum up to 10.5 cm. Some air-filled central small bowel loops. Gas in the rectum. Multiple fluid levels overlying the colon. No gross free air IMPRESSION: 1. Low lung volumes with chronic interstitial opacities and linear areas of scarring. 2. Air distension of the colon with multiple fluid levels with some central air-filled small bowel, findings could be secondary to colonic ileus with low/distal obstruction not entirely excluded. CT follow-up may be performed if appropriate Electronically Signed   By: Luke Bun M.D.   On: 02/01/2024 20:07    Micro Results   Today   Subjective    Chris [name] today has no new concerns - Eating  and drinking well - Voiding well -Patient received MiraLAX  and Dulcolax suppository and patient felt much  better after having BM---abdominal pain improved further - I called and updated patient's daughter Ms. Powell Balloon   Patient has been seen and examined prior to discharge   Objective   Blood pressure 102/65, pulse 70, temperature (!) 97.5 F (36.4 C), resp. rate 17, height 5' 8 (1.727 m), weight 91.5 kg, SpO2 97%.   Intake/Output Summary (Last 24 hours) at 02/04/2024 1426 Last data filed at 02/04/2024 1145 Gross per 24 hour  Intake 890 ml  Output 1225 ml  Net -335 ml    Exam Gen:- Awake Alert, no acute distress  HEENT:- Opp.AT, No sclera icterus Neck-Supple Neck,No JVD,.  Lungs-  CTAB , good air movement bilaterally CV- S1, S2 normal, regular Abd-  +ve B.Sounds, Abd Soft, much improved tenderness, no rebound or guarding Extremity/Skin:- No  edema,   good pulses Psych-affect is appropriate, oriented x3 Neuro-no new focal deficits, no tremors    Data Review   CBC w Diff:  Lab Results  Component Value Date   WBC 10.4 02/04/2024   HGB 10.1 (L) 02/04/2024   HGB 10.9 (L) 06/24/2023   HCT 35.3 (L) 02/04/2024   HCT 37.3 (L) 06/24/2023   PLT 293 02/04/2024   PLT 361 06/24/2023   LYMPHOPCT 21 02/04/2024   MONOPCT 9 02/04/2024   EOSPCT 5 02/04/2024   BASOPCT 1 02/04/2024    CMP:  Lab Results  Component Value Date   NA 135 02/03/2024   NA 144 06/24/2023   K 4.0 02/03/2024   CL 102 02/03/2024   CO2 22 02/03/2024   BUN 12 02/03/2024   BUN 11 06/24/2023   CREATININE 1.19 02/03/2024   CREATININE 1.29 (H) 09/10/2023   PROT 8.0 02/02/2024   ALBUMIN  4.1 02/02/2024   BILITOT 0.5 02/02/2024   ALKPHOS 58 02/02/2024   AST 29 02/02/2024   ALT 10 02/02/2024  .  Total Discharge time is about 33 minutes  Rendall Carwin M.D on 02/04/2024 at 2:26 PM  Go to [email or web address] -  for contact info  [facility] - Office  [phone]

## 2024-02-04 NOTE — Progress Notes (Signed)
 Pt's diet advanced to Soft from full liquid. Pt ate about 50% of meal and started complaining of abdominal pain 5/10 as soon as he finished eating. Pt refused to take pain med so encouraged patient to ambulate in the room. Pt was able to pass gas and stated pain decreased to 2/10. No c/o nausea/ vomiting.  Miralax  and suppository given. Awaiting pt to have BM. Bowel sounds active in all 4 quadrants. Pt receiving IV antibiotic as ordered. Call bell in reach. Will continue to monitor.

## 2024-02-07 ENCOUNTER — Telehealth: Payer: Self-pay | Admitting: *Deleted

## 2024-02-07 NOTE — Transitions of Care (Post Inpatient/ED Visit) (Signed)
   02/07/2024  Name: Chris [name]. MRN: [id number] DOB: 1940-12-23  Today's TOC FU Call Status: Today's TOC FU Call Status:: Unsuccessful Call (1st Attempt) Unsuccessful Call (1st Attempt) Date: 02/07/24  Attempted to reach the patient regarding the most recent Inpatient/ED visit.  Follow Up Plan: Additional outreach attempts will be made to reach the patient to complete the Transitions of Care (Post Inpatient/ED visit) call.   Dewitte Creed RNC, BSN RN Care Manager/ Transition of Care [place]/ [facility] [phone]

## 2024-02-08 ENCOUNTER — Telehealth: Payer: Self-pay | Admitting: *Deleted

## 2024-02-08 NOTE — Transitions of Care (Post Inpatient/ED Visit) (Signed)
   02/08/2024  Name: Chris [name]. MRN: [id number] DOB: 1940/12/07  Today's TOC FU Call Status: Today's TOC FU Call Status:: Unsuccessful Call (2nd Attempt) Unsuccessful Call (2nd Attempt) Date: 02/08/24  Attempted to reach the patient regarding the most recent Inpatient/ED visit.  Follow Up Plan: Additional outreach attempts will be made to reach the patient to complete the Transitions of Care (Post Inpatient/ED visit) call.   Andrea Dimes RN, BSN [place]  [facility] RN Care Manager [phone]

## 2024-02-08 NOTE — Transitions of Care (Post Inpatient/ED Visit) (Signed)
   02/08/2024  Name: Chris [name]. MRN: [id number] DOB: 07-27-1940  Today's TOC FU Call Status: Today's TOC FU Call Status:: Unsuccessful Call (2nd Attempt) Unsuccessful Call (2nd Attempt) Date: 02/08/24  Attempted to reach the patient regarding the most recent Inpatient/ED visit.  Follow Up Plan: Additional outreach attempts will be made to reach the patient to complete the Transitions of Care (Post Inpatient/ED visit) call.   Mliss Creed [facility], BSN RN Care Manager/ Transition of Care [place]/ [facility] [phone]

## 2024-02-09 ENCOUNTER — Telehealth: Payer: Self-pay

## 2024-02-09 NOTE — Transitions of Care (Post Inpatient/ED Visit) (Signed)
   02/09/2024  Name: Chris [name]. MRN: [id number] DOB: 14-Mar-1941  Today's TOC FU Call Status: Today's TOC FU Call Status:: Unsuccessful Call (3rd Attempt) Unsuccessful Call (3rd Attempt) Date: 02/09/24  Attempted to reach the patient regarding the most recent Inpatient/ED visit.  Follow Up Plan: No further outreach attempts will be made at this time. We have been unable to contact the patient.  Alan Ee, RN, BSN, CEN [employer]- Transition of Care Team.  [facility] [phone]

## 2024-02-10 ENCOUNTER — Encounter: Payer: Self-pay | Admitting: Family Medicine

## 2024-02-10 ENCOUNTER — Ambulatory Visit (INDEPENDENT_AMBULATORY_CARE_PROVIDER_SITE_OTHER): Admitting: Family Medicine

## 2024-02-10 VITALS — BP 122/74 | HR 70 | Temp 97.9°F | Ht 68.0 in | Wt 201.0 lb

## 2024-02-10 DIAGNOSIS — R399 Unspecified symptoms and signs involving the genitourinary system: Secondary | ICD-10-CM | POA: Diagnosis not present

## 2024-02-10 DIAGNOSIS — K529 Noninfective gastroenteritis and colitis, unspecified: Secondary | ICD-10-CM

## 2024-02-10 DIAGNOSIS — Z125 Encounter for screening for malignant neoplasm of prostate: Secondary | ICD-10-CM | POA: Diagnosis not present

## 2024-02-10 MED ORDER — TAMSULOSIN HCL 0.4 MG PO CAPS: 0.4000 mg | ORAL_CAPSULE | Freq: Every day | ORAL | 3 refills | Status: DC

## 2024-02-10 NOTE — Progress Notes (Signed)
 Subjective:    Patient ID: Chris JONELLE Vincenzo Mickey., male    DOB: 09-14-1940, 83 y.o.   MRN: [id number]  Recently admitted to the hospital September 9 through 12.  I have copied the discharge summary below for my reference: HPI: Chris [name]. is a 83 y.o. male with medical history significant for hypertension, hyperlipidemia, type 2 diabetes mellitus, CAD status post CABG, chronic HFrEF, pulmonary fibrosis, and hiatal hernia repair with fundoplication in April 2025 who presents with severe abdominal pain and nausea.   Patient reports that he developed severe abdominal pain today, felt bloated, and was having severe nausea with dry heaves.  Symptoms began this morning and have progressively worsened throughout the day.  Pain became very severe, he felt as though he needed to move his bowels, passed a small amount of solid stool, but this did not relieve his pain at all.  He continues to pass gas.  He denies diarrhea, fever, or chills associated with this.   ED Course: Upon arrival to the ED, patient is found to be afebrile and saturating well on room air initially with normal HR and elevated BP.  Labs are most notable for creatinine 1.19, normal LFTs and lipase, normal WBC, and normal troponin.  CT is negative for SBO but notable for mild pericolonic stranding involving the hepatic flexure and mildly dilated colon suggestive of ileus.   Patient was treated in the ED with IV fluids, Zofran , and Dilaudid .   Review of Systems:  All other systems reviewed and apart from HPI, are negative.      Hospital Course:      Brief Narrative:  83 y.o. male with medical history significant for hypertension, hyperlipidemia, type 2 diabetes mellitus, CAD status post CABG, chronic HFrEF, pulmonary fibrosis, and hiatal hernia repair with fundoplication in April 2025 admitted on 02/02/2024 with colitis after presenting with abdominal pain and nausea     -Assessment and Plan: 1)Acute Colitis mostly around the  hepatic flexure----POA -Does Not meet sepsis criteria -?? if related to recent abdominal surgery - No evidence of SBO, - CT also suggest some degree of ileus -Follow-up abdominal x-rays from 02/03/2023 without acute changes - WBC 18.6>>12.9 >>10.4 - Treated with IV Rocephin  and Flagyl , okay to discharge on Omnicef  and Flagyl  -He is passing gas - Has not required IV pain medications since 02/02/2024 -Last dose of as needed oxycodone  was 6 PM on 02/03/2024 -Advance to solid diet -Patient received MiraLAX  and Dulcolax suppository and patient felt much better after having BM---abdominal pain improved further -May use Tylenol  and tramadol  as needed   2)DM2--A1c 6.6 reflecting good diabetic control PTA --- Okay to resume PTA diabetic regimen   3)GERD--history of hiatal hernia--stable, status post recent Nissen fundoplication - Continue Protonix  and Carafate    4)H/o CAD and Prior H/o CVA-continue Plavix  and Crestor  secondary prevention Had LHC on 07/05/23   5)HFrEF--history of ischemic cardiomyopathy with a EF in the 30 to 35% range based on echo from December 2024 -resume Lasix  and Aldactone  - -No evidence of CHF flareup at this time   6)CKD II-patient with poor oral intake due to colitis nausea and vomiting with risk for dehydration and AKI  creatinine on admission=1.30  ,  baseline creatinine = 1.1 on 02/01/24   ,  creatinine is now= with hydration renal function is currently stable with creatinine of 1.19 Renal function improved with IV fluids -, renally adjust medications, avoid nephrotoxic agents / dehydration  / hypotension   7) chronic  anemia--hemoglobin greater than 10 -Overall stable H&H despite IV fluids  02/10/24 Patient states that the day he went to the hospital, he developed intense abdominal pain while trying to use the bathroom.  He had gone 3 days without having a bowel movement.  He went to the restroom to defecate and the pain became unbearable.  He had to go to the  hospital via EMS.  He is now finally going to the bathroom after taking some magnesium citrate at home.  He denies any abdominal pain.  However he reports urinary incontinence.  He states for the last 2 years, he has sudden episodes of stress incontinence.  If he stands up, he will often wet himself.  He states that he does not have any warning.  Sometimes he will feel himself peeing without even feeling the need to go.  However he states whenever he goes to the bathroom he has a very weak dribbling stream.  He has to stand there for a long time before the urine will even start.  On prostate exam today however his prostate is only slightly swollen.  There is no nodularity or tenderness Past Medical History:  Diagnosis Date   Abnormal exercise myocardial perfusion study    03/2018- 38% ef, see report   Arthritis    Chronic kidney disease    Congestive heart failure (CHF) (HCC) 09/2015   Coronary artery disease    DOE (dyspnea on exertion)    ED (erectile dysfunction)    GERD (gastroesophageal reflux disease)    occ   Gout    H/O hiatal hernia    Hyperlipidemia    Hypertension    Myocardial infarction [facility])    Neuropathy    toes   Non-ST elevation (NSTEMI) myocardial infarction (HCC) 02/15/2014   Obesity    RBBB (right bundle branch block)    Stenosis of right internal carotid artery    Type II diabetes mellitus (HCC)    Umbilical hernia    a. s/p repair.   Vertigo     Past Surgical History:  Procedure Laterality Date   ANKLE ARTHROSCOPY WITH DRILLING/MICROFRACTURE Left 04/13/2013   Procedure: LEFT ANKLE ARTHROSCOPY WITH EXTENSIVE DEBRIEDMENT;  Surgeon: Norleen Armor, MD;  Location: [facility];  Service: Orthopedics;  Laterality: Left;   ANKLE SURGERY Left 08/18/2013   DR HEWITT   CARDIAC CATHETERIZATION  2000   stents x3   CARDIAC CATHETERIZATION  02/15/2014   Procedure: LEFT HEART CATH AND CORS/GRAFTS ANGIOGRAPHY;  Surgeon: Erick JONELLE Bergamo, MD;  Location: [facility] CATH  LAB;  Service: Cardiovascular;;   COLONOSCOPY     CORONARY ARTERY BYPASS GRAFT  1995   LIMA to LAD, SVG to RCA, SVG to OM.    CORONARY STENT INTERVENTION N/A 08/12/2020   Procedure: CORONARY STENT INTERVENTION;  Surgeon: Elmira Newman PARAS, MD;  Location: [facility] INVASIVE CV LAB;  Service: Cardiovascular;  Laterality: N/A;   CORONARY STENT INTERVENTION N/A 09/04/2021   Procedure: CORONARY STENT INTERVENTION;  Surgeon: Bergamo Heinz, MD;  Location: [facility] INVASIVE CV LAB;  Service: Cardiovascular;  Laterality: N/A;   ESOPHAGOGASTRODUODENOSCOPY N/A 08/30/2023   Procedure: EGD (ESOPHAGOGASTRODUODENOSCOPY);  Surgeon: Albertus Heinz HERO, MD;  Location: [facility];  Service: Gastroenterology;  Laterality: N/A;   HIATAL HERNIA REPAIR N/A 09/02/2023   Procedure: REPAIR, HERNIA, HIATAL, LAPAROSCOPIC;  Surgeon: Rubin Calamity, MD;  Location: [facility] ORS;  Service: General;  Laterality: N/A;  ROBOTIC HIATAL HERNIA REPAIR WITH MESH AND FUNDOPLICATION   LEFT HEART CATH AND CORS/GRAFTS ANGIOGRAPHY  N/A 08/12/2020   Procedure: LEFT HEART CATH AND CORS/GRAFTS ANGIOGRAPHY;  Surgeon: Elmira Newman PARAS, MD;  Location: [facility] LAB;  Service: Cardiovascular;  Laterality: N/A;   LEFT HEART CATH AND CORS/GRAFTS ANGIOGRAPHY N/A 09/04/2021   Procedure: LEFT HEART CATH AND CORS/GRAFTS ANGIOGRAPHY;  Surgeon: Ladona Heinz, MD;  Location: [facility] CV LAB;  Service: Cardiovascular;  Laterality: N/A;   RIGHT/LEFT HEART CATH AND CORONARY/GRAFT ANGIOGRAPHY N/A 07/05/2023   Procedure: RIGHT/LEFT HEART CATH AND CORONARY/GRAFT ANGIOGRAPHY;  Surgeon: Ladona Heinz, MD;  Location: [facility] CV LAB;  Service: Cardiovascular;  Laterality: N/A;   TOTAL ANKLE ARTHROPLASTY Left 08/17/2013   Procedure: LEFT TOTAL ANKLE REPLACEMENT WITH POSSIBLE GASTROC RECESSION ;  Surgeon: Norleen Armor, MD;  Location: [facility] OR;  Service: Orthopedics;  Laterality: Left;   UMBILICAL HERNIA REPAIR  12/2007   Current Outpatient Medications on File Prior to Visit  Medication  Sig Dispense Refill   acetaminophen  (TYLENOL ) 325 MG tablet Take 2 tablets (650 mg total) by mouth every 6 (six) hours as needed for mild pain (pain score 1-3) or fever (or Fever >/= 101).     Blood Glucose Monitoring Suppl DEVI 1 each by Does not apply route in the morning, at noon, and at bedtime. May substitute to any manufacturer covered by patient's insurance. 1 each 0   cefdinir  (OMNICEF ) 300 MG capsule Take 1 capsule (300 mg total) by mouth 2 (two) times daily for 7 days. 14 capsule 0   clopidogrel  (PLAVIX ) 75 MG tablet TAKE 1 TABLET EVERY DAY (Patient taking differently: Take 75 mg by mouth in the morning.) 90 tablet 3   cyanocobalamin  (VITAMIN B12) 1000 MCG tablet Take 1 tablet (1,000 mcg total) by mouth daily. 90 tablet 3   empagliflozin  (JARDIANCE ) 10 MG TABS tablet Take 1 tablet (10 mg total) by mouth daily before breakfast. 90 tablet 3   furosemide  (LASIX ) 20 MG tablet Take 20 mg by mouth in the morning.     metroNIDAZOLE  (FLAGYL ) 500 MG tablet Take 1 tablet (500 mg total) by mouth 3 (three) times daily for 7 days. 21 tablet 0   nitroGLYCERIN  (NITROSTAT ) 0.4 MG SL tablet Place 1 tablet (0.4 mg total) under the tongue every 5 (five) minutes as needed for chest pain. 15 tablet 3   pantoprazole  (PROTONIX ) 40 MG tablet Take 1 tablet (40 mg total) by mouth daily before breakfast. 90 tablet 3   polyethylene glycol (MIRALAX  / GLYCOLAX ) 17 g packet Take 17 g by mouth daily. 30 each 2   rosuvastatin  (CRESTOR ) 40 MG tablet Take 1 tablet (40 mg total) by mouth daily. 90 tablet 3   simethicone  (MYLICON) 80 MG chewable tablet Chew 1 tablet (80 mg total) by mouth 4 (four) times daily as needed for flatulence. 20 tablet 0   Specialty Vitamins Products (LUNG PO) Take 1 capsule by mouth in the morning.     spironolactone  (ALDACTONE ) 25 MG tablet Take 0.5 tablets (12.5 mg total) by mouth daily. 45 tablet 3   sucralfate  (CARAFATE ) 1 g tablet Take 1 tablet (1 g total) by mouth 4 (four) times daily -  with  meals and at bedtime. 120 tablet 1   traMADol  (ULTRAM ) 50 MG tablet Take 1 tablet (50 mg total) by mouth every 12 (twelve) hours as needed. 10 tablet 0   traZODone  (DESYREL ) 50 MG tablet Take 0.5 tablets (25 mg total) by mouth at bedtime as needed for sleep. 45 tablet 2   No current facility-administered medications on file prior  to visit.   Allergies  Allergen Reactions   Brilinta  [Ticagrelor ] Shortness Of Breath   Alpha-Gal Hives   Crestor  [Rosuvastatin  Calcium ] Other (See Comments)    Muscle pain- tolerating this 2022, however   Lipitor [Atorvastatin ] Other (See Comments)    Intolerable muscle pain all over    Social History   Socioeconomic History   Marital status: Widowed    Spouse name: Not on file   Number of children: 4   Years of education: Not on file   Highest education level: Not on file  Occupational History   Not on file  Tobacco Use   Smoking status: Former    Current packs/day: 0.00    Average packs/day: 1 pack/day for 9.0 years (9.0 ttl pk-yrs)    Types: Cigarettes    Start date: 10/30/1961    Quit date: 10/31/1970    Years since quitting: 53.3   Smokeless tobacco: Former    Types: Chew   Tobacco comments:    chewed for 2 years in his 20's  Vaping Use   Vaping status: Never Used  Substance and Sexual Activity   Alcohol use: Not Currently    Comment: occ wine last 6 months   Drug use: No   Sexual activity: Yes  Other Topics Concern   Not on file  Social History Narrative   Pt's wife died 06/10/2023 at [facility] on 5th floor.   Retired.   Social Drivers of Corporate investment banker Strain: Low Risk  (05/14/2022)   Overall Financial Resource Strain (CARDIA)    Difficulty of Paying Living Expenses: Not hard at all  Food Insecurity: No Food Insecurity (02/02/2024)   Hunger Vital Sign    Worried About Running Out of Food in the Last Year: Never true    Ran Out of Food in the Last Year: Never true  Transportation Needs: No Transportation Needs  (02/02/2024)   PRAPARE - Administrator, Civil Service (Medical): No    Lack of Transportation (Non-Medical): No  Physical Activity: Insufficiently Active (05/14/2022)   Exercise Vital Sign    Days of Exercise per Week: 3 days    Minutes of Exercise per Session: 30 min  Stress: No Stress Concern Present (05/14/2022)   [employer] of Occupational Health - Occupational Stress Questionnaire    Feeling of Stress : Only a little  Social Connections: Moderately Integrated (02/02/2024)   Social Connection and Isolation Panel    Frequency of Communication with Friends and Family: Three times a week    Frequency of Social Gatherings with Friends and Family: Three times a week    Attends Religious Services: More than 4 times per year    Active Member of Clubs or Organizations: Yes    Attends Banker Meetings: More than 4 times per year    Marital Status: Widowed  Intimate Partner Violence: Not At Risk (02/02/2024)   Humiliation, Afraid, Rape, and Kick questionnaire    Fear of Current or Ex-Partner: No    Emotionally Abused: No    Physically Abused: No    Sexually Abused: No     Review of Systems  All other systems reviewed and are negative.      Objective:   Physical Exam Vitals reviewed.  Constitutional:      General: He is not in acute distress.    Appearance: He is obese. He is not ill-appearing or toxic-appearing.  Cardiovascular:     Rate and Rhythm: Normal rate and  regular rhythm.     Heart sounds: No murmur heard.    No friction rub. No gallop.  Pulmonary:     Effort: Pulmonary effort is normal. No respiratory distress.     Breath sounds: No stridor. No wheezing, rhonchi or rales.  Abdominal:     General: Abdomen is flat. Bowel sounds are normal. There is no distension.     Palpations: Abdomen is soft.     Tenderness: There is no abdominal tenderness. There is no guarding.  Genitourinary:    Prostate: Normal.     Rectum: Normal.   Musculoskeletal:     Right lower leg: No edema.     Left lower leg: No edema.  Skin:    Findings: No erythema or rash.  Neurological:     Mental Status: He is alert.           Assessment & Plan:  Lower urinary tract symptoms (LUTS) - Plan: CBC with Differential/Platelet, Comprehensive metabolic panel with GFR, PSA  Colitis Patient had colitis but I believe that this was more likely due to constipation.  Therefore we discussed ways to prevent constipation in the future.  I recommended using MiraLAX  on a daily basis to maintain regular bowel movements.  If using a stool softener like MiraLAX  is not effective, he can supplement occasionally with a stimulant laxative such as Senokot or Dulcolax.  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  Prostate exam is unremarkable.  However I will try the patient on Flomax  0.4 mg daily to see if this helps his symptoms.

## 2024-02-11 ENCOUNTER — Ambulatory Visit: Payer: Self-pay | Admitting: Family Medicine

## 2024-02-11 LAB — COMPREHENSIVE METABOLIC PANEL WITH GFR
AG Ratio: 1.1 (calc) (ref 1.0–2.5)
ALT: 11 U/L (ref 9–46)
AST: 27 U/L (ref 10–35)
Albumin: 4.1 g/dL (ref 3.6–5.1)
Alkaline phosphatase (APISO): 47 U/L (ref 35–144)
BUN: 10 mg/dL (ref 7–25)
CO2: 24 mmol/L (ref 20–32)
Calcium: 9.5 mg/dL (ref 8.6–10.3)
Chloride: 103 mmol/L (ref 98–110)
Creat: 1.17 mg/dL (ref 0.70–1.22)
Globulin: 3.9 g/dL — ABNORMAL HIGH (ref 1.9–3.7)
Glucose, Bld: 86 mg/dL (ref 65–99)
Potassium: 5.1 mmol/L (ref 3.5–5.3)
Sodium: 138 mmol/L (ref 135–146)
Total Bilirubin: 0.5 mg/dL (ref 0.2–1.2)
Total Protein: 8 g/dL (ref 6.1–8.1)
eGFR: 63 mL/min/1.73m2 (ref >=60)

## 2024-02-11 LAB — CBC WITH DIFFERENTIAL/PLATELET
Absolute Lymphocytes: 3151 {cells}/uL (ref 850–3900)
Absolute Monocytes: 894 {cells}/uL (ref 200–950)
Basophils Absolute: 62 {cells}/uL (ref 0–200)
Basophils Relative: 0.6 %
Eosinophils Absolute: 385 {cells}/uL (ref 15–500)
Eosinophils Relative: 3.7 %
HCT: 37.9 % — ABNORMAL LOW (ref 38.5–50.0)
Hemoglobin: 11.1 g/dL — ABNORMAL LOW (ref 13.2–17.1)
MCH: 20.8 pg — ABNORMAL LOW (ref 27.0–33.0)
MCHC: 29.3 g/dL — ABNORMAL LOW (ref 32.0–36.0)
MCV: 71.1 fL — ABNORMAL LOW (ref 80.0–100.0)
MPV: 9 fL (ref 7.5–12.5)
Monocytes Relative: 8.6 %
Neutro Abs: 5907 {cells}/uL (ref 1500–7800)
Neutrophils Relative %: 56.8 %
Platelets: 417 Thousand/uL — ABNORMAL HIGH (ref 140–400)
RBC: 5.33 Million/uL (ref 4.20–5.80)
RDW: 19.6 % — ABNORMAL HIGH (ref 11.0–15.0)
Total Lymphocyte: 30.3 %
WBC: 10.4 Thousand/uL (ref 3.8–10.8)

## 2024-02-11 LAB — PSA: PSA: 2.04 ng/mL (ref <=4.00)

## 2024-02-14 ENCOUNTER — Other Ambulatory Visit: Payer: Self-pay

## 2024-02-15 MED ORDER — FUROSEMIDE 20 MG PO TABS: 20.0000 mg | ORAL_TABLET | Freq: Every morning | ORAL | 2 refills | Status: AC

## 2024-02-16 ENCOUNTER — Ambulatory Visit: Payer: Self-pay

## 2024-02-16 NOTE — Telephone Encounter (Signed)
  FYI Only or Action Required?: Action required by provider: clinical question for provider and update on patient condition.  Patient was last seen in primary care on 02/10/2024 by [name], MD.  Called Nurse Triage reporting Advice Only.  Symptoms began today.  Interventions attempted: Nothing.  Symptoms are: unchanged.  Triage Disposition: Call PCP Now  Patient/caregiver understands and will follow disposition?: Yes  Copied from CRM #[id number]. Topic: Clinical - Red Word Triage >> Feb 16, 2024  3:22 PM Emylou G wrote: [facility] that prompted transfer to Nurse Triage: Patient called: sucralfate  (CARAFATE ) 1 g tablet tamsulosin  (FLOMAX ) 0.4 MG CAPS capsule - made him dizzy.. so he tried it yesterday.. ( stopped )spironolactone  (ALDACTONE ) 25 MG tablet  He needs clarity of what these are?  He was in the hospital a week ago ( blockage ) Reason for Disposition  [1] Caller has URGENT medicine question about med that primary care doctor (or NP/PA) or specialist prescribed AND [2] triager unable to answer question  Answer Assessment - Initial Assessment Questions 1. NAME of MEDICINE: What medicine(s) are you calling about?     Sucralfate  Tamsulosin  Spironolactone  lasix  2. QUESTION: What is your question? (e.g., double dose of medicine, side effect)     Patient feeling dizzy when taking spironolacone and furosemide  3. PRESCRIBER: Who prescribed the medicine? Reason: if prescribed by specialist, call should be referred to that group.     [name] 4. SYMPTOMS: Do you have any symptoms? If Yes, ask: What symptoms are you having?  How bad are the symptoms (e.g., mild, moderate, severe)     dizziness 5. PREGNANCY:  Is there any chance that you are pregnant? When was your last menstrual period?     N/A  Patient has not had BM since hospital discharge one week ago.  States that he had very small BM before discharge. Admits to passing gas only in the mornings Eating  soft, bland diet at home  Patient drinks Gatorade (one small bottle) and one bottle of water yesterday.  Protocols used: Medication Question Call-A-AH

## 2024-02-23 ENCOUNTER — Telehealth: Payer: Self-pay

## 2024-02-23 NOTE — Telephone Encounter (Signed)
 Copied from CRM #[id number]. Topic: Clinical - Medical Advice >> Feb 23, 2024 12:08 PM Sophia H wrote: Reason for CRM: Patient is requesting to speak with Dr. Clara nurse in office. Declined to provide me any information. # [phone]

## 2024-03-02 DIAGNOSIS — Z961 Presence of intraocular lens: Secondary | ICD-10-CM | POA: Diagnosis not present

## 2024-03-02 DIAGNOSIS — H524 Presbyopia: Secondary | ICD-10-CM | POA: Diagnosis not present

## 2024-03-02 DIAGNOSIS — H353131 Nonexudative age-related macular degeneration, bilateral, early dry stage: Secondary | ICD-10-CM | POA: Diagnosis not present

## 2024-03-29 ENCOUNTER — Other Ambulatory Visit: Payer: Self-pay | Admitting: Cardiology

## 2024-03-29 DIAGNOSIS — I6521 Occlusion and stenosis of right carotid artery: Secondary | ICD-10-CM

## 2024-03-29 DIAGNOSIS — I214 Non-ST elevation (NSTEMI) myocardial infarction: Secondary | ICD-10-CM

## 2024-03-29 DIAGNOSIS — I25110 Atherosclerotic heart disease of native coronary artery with unstable angina pectoris: Secondary | ICD-10-CM

## 2024-03-30 ENCOUNTER — Other Ambulatory Visit: Payer: Self-pay | Admitting: Cardiology

## 2024-04-24 ENCOUNTER — Telehealth: Payer: Self-pay | Admitting: Family Medicine

## 2024-04-24 NOTE — Telephone Encounter (Signed)
 Copied from CRM #[id number]. Topic: Clinical - Medication Question >> Apr 24, 2024  1:43 PM Lonell PEDLAR wrote: Reason for CRM: Patient received rx in mail from [facility] spironolactone  (ALDACTONE ) 25 MG tablet. Patient is wondering what this medication is for. C/b: [phone]

## 2024-05-07 ENCOUNTER — Other Ambulatory Visit: Payer: Self-pay | Admitting: Family Medicine

## 2024-05-29 ENCOUNTER — Ambulatory Visit: Payer: Self-pay | Admitting: *Deleted

## 2024-05-29 NOTE — Telephone Encounter (Addendum)
 FYI Only or Action Required?: FYI only for provider: appointment scheduled on 06/01/24.  Patient was last seen in primary care on 02/10/2024 by [name], MD.  Called Nurse Triage reporting Neurologic Problem and Cough.  Symptoms began chronic symptoms.  Interventions attempted: Other: Blood Stabilizers with Biotin.  Symptoms are: lungs are better, cough is same, feet worse  Triage Disposition: See Physician Within 24 Hours  Patient/caregiver understands and will follow disposition?: Yes  Copied from CRM #[id number]. Topic: Clinical - Red Word Triage >> May 29, 2024  3:16 PM Donna BRAVO wrote: Red Word that prompted transfer to Nurse Triage:   left foot, has no use for it, can't feel it from ankle to toes feels like walking on a ball.  Once in a while patient will have a spell Cough  Hear it rattle when laying down    Patient has multiple complaints- foot numbness-bilateral- ongoing- PCP is aware- but no plan per patient.  Patient has lung disease-he has chronic cough with thick mucus. Patient states he has been exposed to pneumonia- family and is concerned about change color of sputum.  Patient taking supplement to clean lungs: Blood Stabilizers with Biotin- he states it has made a world of difference in his breathing- wants PCP to be aware - he will bring it to his appointment.    Reason for Disposition  [1] Numbness or tingling in both feet AND [2] new or increased  [1] Known COPD or other severe lung disease (i.e., bronchiectasis, cystic fibrosis, lung surgery) AND [2] symptoms getting worse (i.e., increased sputum purulence or amount, increased breathing difficulty  Answer Assessment - Initial Assessment Questions 1. SYMPTOM: What's the main symptom you're concerned about? (e.g., rash, sore, callus, drainage, numbness)     Patient states he can't feel his feet 2. LOCATION: Where is the  numbness located? (e.g., foot/toe, top/bottom, left/right)     Ankle down 3.  APPEARANCE: What does the area look like? (e.g., normal, red, swollen; size)     Normal  4. ONSET: When did the  numbness  start?     Over 1 month 5. PAIN: Is there any pain? If Yes, ask: How bad is it? (Scale: 0-10; none, mild, moderate, severe)     none 6. CAUSE: What do you think is causing the symptoms?     neuropathy 7. BLOOD GLUCOSE: What is your blood glucose level?      Up and down 8. USUAL RANGE: What is your blood glucose level usually? (e.g., usual fasting morning value, usual evening value)     Patient doesn't test- he feels it 9. OTHER SYMPTOMS: Do you have any other symptoms? (e.g., fever, weakness)     cough  Answer Assessment - Initial Assessment Questions 1. ONSET: When did the cough begin?      Chronic cough 2. SEVERITY: How bad is the cough today?      Daily cough 3. SPUTUM: Describe the color of your sputum (e.g., none, dry cough; clear, white, yellow, green)     Green in am- clears during the day 4. HEMOPTYSIS: Are you coughing up any blood? If Yes, ask: How much? (e.g., flecks, streaks, tablespoons, etc.)     no 5. DIFFICULTY BREATHING: Are you having difficulty breathing? If Yes, ask: How bad is it? (e.g., mild, moderate, severe)      good 6. FEVER: Do you have a fever? If Yes, ask: What is your temperature, how was it measured, and when did it start?  no 7. CARDIAC HISTORY: Do you have any history of heart disease? (e.g., heart attack, congestive heart failure)      Hx heart disease  8. LUNG HISTORY: Do you have any history of lung disease?  (e.g., pulmonary embolus, asthma, emphysema)     Pulmonary fibrosis  10. OTHER SYMPTOMS: Do you have any other symptoms? (e.g., runny nose, wheezing, chest pain)       no  12. TRAVEL: Have you traveled out of the country in the last month? (e.g., travel history, exposures)       Son diagnosed pneumonia  Protocols used: Diabetes - Foot Problems and Questions-A-AH, Cough -  Acute Productive-A-AH

## 2024-06-01 ENCOUNTER — Ambulatory Visit: Admitting: Family Medicine

## 2024-06-01 ENCOUNTER — Encounter: Payer: Self-pay | Admitting: Family Medicine

## 2024-06-01 VITALS — BP 138/80 | HR 76 | Temp 98.0°F | Ht 68.0 in | Wt 194.4 lb

## 2024-06-01 DIAGNOSIS — E1159 Type 2 diabetes mellitus with other circulatory complications: Secondary | ICD-10-CM | POA: Diagnosis not present

## 2024-06-01 DIAGNOSIS — J8410 Pulmonary fibrosis, unspecified: Secondary | ICD-10-CM | POA: Diagnosis not present

## 2024-06-01 DIAGNOSIS — Z7984 Long term (current) use of oral hypoglycemic drugs: Secondary | ICD-10-CM

## 2024-06-01 DIAGNOSIS — I5042 Chronic combined systolic (congestive) and diastolic (congestive) heart failure: Secondary | ICD-10-CM | POA: Diagnosis not present

## 2024-06-01 NOTE — Progress Notes (Signed)
 "  Subjective:    Patient ID: Chris JONELLE Vincenzo Mickey., male    DOB: Nov 09, 1940, 84 y.o.   MRN: [id number]  Patient is an 84 year old Caucasian gentleman with history of DM2.  Patient also has congestive heart failure, coronary artery disease, and pulmonary fibrosis.  He has a history of chronic kidney disease and peripheral neuropathy.  Today he reports numbness in both feet left greater than right.  He states from his mid shin to his toes, his entire left foot is numb.  He has no sensation of 10 g monofilament.  On his right foot, the numbness starts around his ankle and is not quite as severe although he has no sensation to 10 g monofilament.  He denies any burning or tingling.  He is taking spironolactone  and Jardiance  along with furosemide .  He states that his breathing is much better.  Over the last year he had surgery to repair a hiatal hernia however he attributes the improvement in his breathing to a vitamin supplement that he is taking.  He is due for fasting lab work. Past Medical History:  Diagnosis Date   Abnormal exercise myocardial perfusion study    03/2018- 38% ef, see report   Arthritis    Chronic kidney disease    Congestive heart failure (CHF) (HCC) 09/2015   Coronary artery disease    DOE (dyspnea on exertion)    ED (erectile dysfunction)    GERD (gastroesophageal reflux disease)    occ   Gout    H/O hiatal hernia    Hyperlipidemia    Hypertension    Myocardial infarction [facility])    Neuropathy    toes   Non-ST elevation (NSTEMI) myocardial infarction (HCC) 02/15/2014   Obesity    RBBB (right bundle branch block)    Stenosis of right internal carotid artery    Type II diabetes mellitus (HCC)    Umbilical hernia    a. s/p repair.   Vertigo     Past Surgical History:  Procedure Laterality Date   ANKLE ARTHROSCOPY WITH DRILLING/MICROFRACTURE Left 04/13/2013   Procedure: LEFT ANKLE ARTHROSCOPY WITH EXTENSIVE DEBRIEDMENT;  Surgeon: Norleen Armor, MD;  Location: [facility];  Service: Orthopedics;  Laterality: Left;   ANKLE SURGERY Left 08/18/2013   DR HEWITT   CARDIAC CATHETERIZATION  2000   stents x3   CARDIAC CATHETERIZATION  02/15/2014   Procedure: LEFT HEART CATH AND CORS/GRAFTS ANGIOGRAPHY;  Surgeon: Erick JONELLE Bergamo, MD;  Location: [facility] CATH LAB;  Service: Cardiovascular;;   COLONOSCOPY     CORONARY ARTERY BYPASS GRAFT  1995   LIMA to LAD, SVG to RCA, SVG to OM.    CORONARY STENT INTERVENTION N/A 08/12/2020   Procedure: CORONARY STENT INTERVENTION;  Surgeon: Elmira Newman PARAS, MD;  Location: [facility] CV LAB;  Service: Cardiovascular;  Laterality: N/A;   CORONARY STENT INTERVENTION N/A 09/04/2021   Procedure: CORONARY STENT INTERVENTION;  Surgeon: Bergamo Heinz, MD;  Location: [facility];  Service: Cardiovascular;  Laterality: N/A;   ESOPHAGOGASTRODUODENOSCOPY N/A 08/30/2023   Procedure: EGD (ESOPHAGOGASTRODUODENOSCOPY);  Surgeon: Albertus Heinz HERO, MD;  Location: [facility];  Service: Gastroenterology;  Laterality: N/A;   HIATAL HERNIA REPAIR N/A 09/02/2023   Procedure: REPAIR, HERNIA, HIATAL, LAPAROSCOPIC;  Surgeon: Rubin Calamity, MD;  Location: [facility]S;  Service: General;  Laterality: N/A;  ROBOTIC HIATAL HERNIA REPAIR WITH MESH AND FUNDOPLICATION   LEFT HEART CATH AND CORS/GRAFTS ANGIOGRAPHY N/A 08/12/2020   Procedure: LEFT HEART CATH AND CORS/GRAFTS ANGIOGRAPHY;  Surgeon: Elmira Newman PARAS, MD;  Location: [facility];  Service: Cardiovascular;  Laterality: N/A;   LEFT HEART CATH AND CORS/GRAFTS ANGIOGRAPHY N/A 09/04/2021   Procedure: LEFT HEART CATH AND CORS/GRAFTS ANGIOGRAPHY;  Surgeon: Ladona Heinz, MD;  Location: [facility];  Service: Cardiovascular;  Laterality: N/A;   RIGHT/LEFT HEART CATH AND CORONARY/GRAFT ANGIOGRAPHY N/A 07/05/2023   Procedure: RIGHT/LEFT HEART CATH AND CORONARY/GRAFT ANGIOGRAPHY;  Surgeon: Ladona Heinz, MD;  Location: [facility];  Service: Cardiovascular;  Laterality: N/A;   TOTAL ANKLE  ARTHROPLASTY Left 08/17/2013   Procedure: LEFT TOTAL ANKLE REPLACEMENT WITH POSSIBLE GASTROC RECESSION ;  Surgeon: Norleen Armor, MD;  Location: [facility];  Service: Orthopedics;  Laterality: Left;   UMBILICAL HERNIA REPAIR  12/2007   Current Outpatient Medications on File Prior to Visit  Medication Sig Dispense Refill   acetaminophen  (TYLENOL ) 325 MG tablet Take 2 tablets (650 mg total) by mouth every 6 (six) hours as needed for mild pain (pain score 1-3) or fever (or Fever >/= 101).     Blood Glucose Monitoring Suppl DEVI 1 each by Does not apply route in the morning, at noon, and at bedtime. May substitute to any manufacturer covered by patient's insurance. 1 each 0   clopidogrel  (PLAVIX ) 75 MG tablet TAKE 1 TABLET EVERY DAY (Patient taking differently: Take 75 mg by mouth in the morning.) 90 tablet 3   cyanocobalamin  (VITAMIN B12) 1000 MCG tablet Take 1 tablet (1,000 mcg total) by mouth daily. 90 tablet 3   empagliflozin  (JARDIANCE ) 10 MG TABS tablet Take 1 tablet (10 mg total) by mouth daily before breakfast. 90 tablet 3   furosemide  (LASIX ) 20 MG tablet Take 1 tablet (20 mg total) by mouth in the morning. 90 tablet 2   nitroGLYCERIN  (NITROSTAT ) 0.4 MG SL tablet Place 1 tablet (0.4 mg total) under the tongue every 5 (five) minutes as needed for chest pain. 15 tablet 3   pantoprazole  (PROTONIX ) 40 MG tablet Take 1 tablet (40 mg total) by mouth daily before breakfast. 90 tablet 3   polyethylene glycol (MIRALAX  / GLYCOLAX ) 17 g packet Take 17 g by mouth daily. 30 each 2   rosuvastatin  (CRESTOR ) 40 MG tablet TAKE 1 TABLET EVERY DAY 90 tablet 2   simethicone  (MYLICON) 80 MG chewable tablet Chew 1 tablet (80 mg total) by mouth 4 (four) times daily as needed for flatulence. 20 tablet 0   Specialty Vitamins Products (LUNG PO) Take 1 capsule by mouth in the morning.     spironolactone  (ALDACTONE ) 25 MG tablet Take 0.5 tablets (12.5 mg total) by mouth daily. 45 tablet 3   sucralfate  (CARAFATE ) 1 g tablet Take  1 tablet (1 g total) by mouth 4 (four) times daily -  with meals and at bedtime. 120 tablet 1   tamsulosin  (FLOMAX ) 0.4 MG CAPS capsule TAKE 1 CAPSULE BY MOUTH EVERY DAY 90 capsule 1   traMADol  (ULTRAM ) 50 MG tablet Take 1 tablet (50 mg total) by mouth every 12 (twelve) hours as needed. 10 tablet 0   traZODone  (DESYREL ) 50 MG tablet Take 0.5 tablets (25 mg total) by mouth at bedtime as needed for sleep. 45 tablet 2   No current facility-administered medications on file prior to visit.   Allergies  Allergen Reactions   Brilinta  [Ticagrelor ] Shortness Of Breath   Alpha-Gal Hives   Crestor  [Rosuvastatin  Calcium ] Other (See Comments)    Muscle pain- tolerating this 2022, however   Lipitor [Atorvastatin ] Other (See Comments)  Intolerable muscle pain all over    Social History   Socioeconomic History   Marital status: Widowed    Spouse name: Not on file   Number of children: 4   Years of education: Not on file   Highest education level: Not on file  Occupational History   Not on file  Tobacco Use   Smoking status: Former    Current packs/day: 0.00    Average packs/day: 1 pack/day for 9.0 years (9.0 ttl pk-yrs)    Types: Cigarettes    Start date: 10/30/1961    Quit date: 10/31/1970    Years since quitting: 53.6   Smokeless tobacco: Former    Types: Chew   Tobacco comments:    chewed for 2 years in his 20's  Vaping Use   Vaping status: Never Used  Substance and Sexual Activity   Alcohol use: Not Currently    Comment: occ wine last 6 months   Drug use: No   Sexual activity: Yes  Other Topics Concern   Not on file  Social History Narrative   Pt's wife died June 29, 2023 at [facility] on 5th floor.   Retired.   Social Drivers of Health   Tobacco Use: Medium Risk (06/01/2024)   Patient History    Smoking Tobacco Use: Former    Smokeless Tobacco Use: Former    Passive Exposure: Not on Actuary Strain: Low Risk (05/14/2022)   Overall Financial Resource Strain  (CARDIA)    Difficulty of Paying Living Expenses: Not hard at all  Food Insecurity: No Food Insecurity (02/02/2024)   Epic    Worried About Programme Researcher, Broadcasting/film/video in the Last Year: Never true    Ran Out of Food in the Last Year: Never true  Transportation Needs: No Transportation Needs (02/02/2024)   Epic    Lack of Transportation (Medical): No    Lack of Transportation (Non-Medical): No  Physical Activity: Insufficiently Active (05/14/2022)   Exercise Vital Sign    Days of Exercise per Week: 3 days    Minutes of Exercise per Session: 30 min  Stress: No Stress Concern Present (05/14/2022)   [employer] of Occupational Health - Occupational Stress Questionnaire    Feeling of Stress : Only a little  Social Connections: Moderately Integrated (02/02/2024)   Social Connection and Isolation Panel    Frequency of Communication with Friends and Family: Three times a week    Frequency of Social Gatherings with Friends and Family: Three times a week    Attends Religious Services: More than 4 times per year    Active Member of Clubs or Organizations: Yes    Attends Banker Meetings: More than 4 times per year    Marital Status: Widowed  Intimate Partner Violence: Not At Risk (02/02/2024)   Epic    Fear of Current or Ex-Partner: No    Emotionally Abused: No    Physically Abused: No    Sexually Abused: No  Depression (PHQ2-9): Low Risk (09/08/2023)   Depression (PHQ2-9)    PHQ-2 Score: 0  Alcohol Screen: Low Risk (05/14/2022)   Alcohol Screen    Last Alcohol Screening Score (AUDIT): 0  Housing: Low Risk (02/02/2024)   Epic    Unable to Pay for Housing in the Last Year: No    Number of Times Moved in the Last Year: 0    Homeless in the Last Year: No  Utilities: Not At Risk (02/02/2024)   Epic    Threatened  with loss of utilities: No  Health Literacy: Adequate Health Literacy (08/27/2023)   B1300 Health Literacy    Frequency of need for help with medical instructions: Never      Review of Systems  All other systems reviewed and are negative.      Objective:   Physical Exam Vitals reviewed.  Constitutional:      General: He is not in acute distress.    Appearance: He is obese. He is not ill-appearing or toxic-appearing.  Cardiovascular:     Rate and Rhythm: Normal rate and regular rhythm.     Heart sounds: No murmur heard.    No friction rub. No gallop.  Pulmonary:     Effort: Pulmonary effort is normal. No respiratory distress.     Breath sounds: No stridor. No wheezing, rhonchi or rales.  Abdominal:     General: Abdomen is flat. Bowel sounds are normal. There is no distension.     Palpations: Abdomen is soft.     Tenderness: There is no abdominal tenderness. There is no guarding.  Musculoskeletal:     Right lower leg: No edema.     Left lower leg: No edema.  Skin:    Findings: No erythema or rash.  Neurological:     Mental Status: He is alert.           Assessment & Plan:  Type 2 diabetes mellitus with other circulatory complication, without long-term current use of insulin  (HCC) - Plan: Hemoglobin A1c, CBC with Differential/Platelet, Comprehensive metabolic panel with GFR, Microalbumin / creatinine urine ratio  Chronic combined systolic and diastolic CHF (congestive heart failure) (HCC)  Pulmonary fibrosis (HCC) Patient's blood pressure today is excellent.  He is not on maximum goal-directed therapy for his congestive heart failure.  In the past we have tried to get the patient to take Entresto  as well as a beta-blocker.  He has declined taking these medications.  He states that his breathing is much better now and he attributes this to an over-the-counter vitamin supplement with biotin.  I am glad that he is feeling better.  I believe part of his improvement in breathing may be the repair of his hiatal hernia that perhaps was reducing his lung volume.  I will check a hemoglobin A1c to ensure adequate control of his diabetes.  I will also  check a microalbumin to creatinine ratio which I would like to be less than 30 to prevent progression of his chronic kidney disease.  Patient would benefit from taking an ACE or an ARB.  He has refused this in the past.  I will try to encourage this given his history of heart failure and chronic kidney disease once I see his recent lab work from today. "

## 2024-06-02 ENCOUNTER — Ambulatory Visit: Payer: Self-pay | Admitting: Family Medicine

## 2024-06-02 LAB — COMPREHENSIVE METABOLIC PANEL WITH GFR
AG Ratio: 1 (calc) (ref 1.0–2.5)
ALT: 9 U/L (ref 9–46)
AST: 17 U/L (ref 10–35)
Albumin: 4.2 g/dL (ref 3.6–5.1)
Alkaline phosphatase (APISO): 66 U/L (ref 35–144)
BUN: 10 mg/dL (ref 7–25)
CO2: 27 mmol/L (ref 20–32)
Calcium: 9.1 mg/dL (ref 8.6–10.3)
Chloride: 104 mmol/L (ref 98–110)
Creat: 1.17 mg/dL (ref 0.70–1.22)
Globulin: 4.1 g/dL — ABNORMAL HIGH (ref 1.9–3.7)
Glucose, Bld: 92 mg/dL (ref 65–99)
Potassium: 3.8 mmol/L (ref 3.5–5.3)
Sodium: 141 mmol/L (ref 135–146)
Total Bilirubin: 0.5 mg/dL (ref 0.2–1.2)
Total Protein: 8.3 g/dL — ABNORMAL HIGH (ref 6.1–8.1)
eGFR: 63 mL/min/1.73m2

## 2024-06-02 LAB — CBC WITH DIFFERENTIAL/PLATELET
Absolute Lymphocytes: 2976 {cells}/uL (ref 850–3900)
Absolute Monocytes: 911 {cells}/uL (ref 200–950)
Basophils Absolute: 56 {cells}/uL (ref 0–200)
Basophils Relative: 0.6 %
Eosinophils Absolute: 381 {cells}/uL (ref 15–500)
Eosinophils Relative: 4.1 %
HCT: 40.2 % (ref 39.4–51.1)
Hemoglobin: 11.4 g/dL — ABNORMAL LOW (ref 13.2–17.1)
MCH: 20.4 pg — ABNORMAL LOW (ref 27.0–33.0)
MCHC: 28.4 g/dL — ABNORMAL LOW (ref 31.6–35.4)
MCV: 72 fL — ABNORMAL LOW (ref 81.4–101.7)
MPV: 9.2 fL (ref 7.5–12.5)
Monocytes Relative: 9.8 %
Neutro Abs: 4976 {cells}/uL (ref 1500–7800)
Neutrophils Relative %: 53.5 %
Platelets: 422 Thousand/uL — ABNORMAL HIGH (ref 140–400)
RBC: 5.58 Million/uL (ref 4.20–5.80)
RDW: 17.9 % — ABNORMAL HIGH (ref 11.0–15.0)
Total Lymphocyte: 32 %
WBC: 9.3 Thousand/uL (ref 3.8–10.8)

## 2024-06-02 LAB — MICROALBUMIN / CREATININE URINE RATIO
Creatinine, Urine: 42 mg/dL (ref 20–320)
Microalb, Ur: <0.2 mg/dL

## 2024-06-02 LAB — HEMOGLOBIN A1C
Hgb A1c MFr Bld: 6.4 % — ABNORMAL HIGH
Mean Plasma Glucose: 137 mg/dL
eAG (mmol/L): 7.6 mmol/L
